# Patient Record
Sex: Male | Born: 1950 | Race: White | Hispanic: No | Smoking: Former smoker
Health system: Southern US, Community
[De-identification: ages and names within clinical notes are randomized; demographics above are authoritative.]

## PROBLEM LIST (undated history)

## (undated) DIAGNOSIS — E669 Obesity, unspecified: Secondary | ICD-10-CM

## (undated) DIAGNOSIS — M5090 Cervical disc disorder, unspecified, unspecified cervical region: Secondary | ICD-10-CM

## (undated) DIAGNOSIS — M1990 Unspecified osteoarthritis, unspecified site: Secondary | ICD-10-CM

## (undated) DIAGNOSIS — I509 Heart failure, unspecified: Secondary | ICD-10-CM

## (undated) DIAGNOSIS — G4733 Obstructive sleep apnea (adult) (pediatric): Secondary | ICD-10-CM

## (undated) DIAGNOSIS — M545 Low back pain: Secondary | ICD-10-CM

## (undated) DIAGNOSIS — Z86010 Personal history of colonic polyps: Secondary | ICD-10-CM

## (undated) DIAGNOSIS — I10 Essential (primary) hypertension: Secondary | ICD-10-CM

## (undated) DIAGNOSIS — M109 Gout, unspecified: Secondary | ICD-10-CM

## (undated) DIAGNOSIS — N189 Chronic kidney disease, unspecified: Secondary | ICD-10-CM

## (undated) DIAGNOSIS — Z87442 Personal history of urinary calculi: Secondary | ICD-10-CM

## (undated) DIAGNOSIS — E7800 Pure hypercholesterolemia, unspecified: Secondary | ICD-10-CM

## (undated) DIAGNOSIS — K219 Gastro-esophageal reflux disease without esophagitis: Secondary | ICD-10-CM

## (undated) DIAGNOSIS — Z972 Presence of dental prosthetic device (complete) (partial): Secondary | ICD-10-CM

## (undated) HISTORY — PX: COLONOSCOPY: SHX174

## (undated) HISTORY — DX: Obesity, unspecified: E66.9

## (undated) HISTORY — DX: Chronic kidney disease, unspecified: N18.9

## (undated) HISTORY — DX: Gout, unspecified: M10.9

## (undated) HISTORY — DX: Cervical disc disorder, unspecified, unspecified cervical region: M50.90

## (undated) HISTORY — DX: Obstructive sleep apnea (adult) (pediatric): G47.33

## (undated) HISTORY — PX: LUMBAR LAMINECTOMY: SHX95

## (undated) HISTORY — PX: CARPAL TUNNEL RELEASE: SHX101

## (undated) HISTORY — DX: Low back pain: M54.5

## (undated) HISTORY — DX: Pure hypercholesterolemia, unspecified: E78.00

## (undated) HISTORY — PX: MULTIPLE TOOTH EXTRACTIONS: SHX2053

## (undated) HISTORY — DX: Essential (primary) hypertension: I10

## (undated) HISTORY — PX: CARDIAC CATHETERIZATION: SHX172

## (undated) HISTORY — DX: Unspecified osteoarthritis, unspecified site: M19.90

## (undated) HISTORY — PX: TOTAL KNEE ARTHROPLASTY: SHX125

## (undated) HISTORY — DX: Personal history of urinary calculi: Z87.442

## (undated) HISTORY — PX: RADIOFREQUENCY ABLATION KIDNEY: SHX2292

## (undated) HISTORY — DX: Personal history of colonic polyps: Z86.010

---

## 2003-07-24 HISTORY — PX: JOINT REPLACEMENT: SHX530

## 2004-04-26 ENCOUNTER — Encounter: Admission: RE | Admit: 2004-04-26 | Discharge: 2004-04-26 | Payer: Self-pay | Admitting: Neurology

## 2004-07-03 ENCOUNTER — Ambulatory Visit: Payer: Self-pay | Admitting: Internal Medicine

## 2004-07-11 ENCOUNTER — Ambulatory Visit: Payer: Self-pay | Admitting: Internal Medicine

## 2004-10-02 ENCOUNTER — Ambulatory Visit: Payer: Self-pay | Admitting: Internal Medicine

## 2005-01-02 ENCOUNTER — Ambulatory Visit: Payer: Self-pay | Admitting: Internal Medicine

## 2005-06-25 ENCOUNTER — Ambulatory Visit: Payer: Self-pay | Admitting: Internal Medicine

## 2005-07-02 ENCOUNTER — Ambulatory Visit: Payer: Self-pay | Admitting: Internal Medicine

## 2005-07-19 ENCOUNTER — Ambulatory Visit: Payer: Self-pay | Admitting: Internal Medicine

## 2005-08-13 ENCOUNTER — Ambulatory Visit: Payer: Self-pay | Admitting: Internal Medicine

## 2005-09-18 ENCOUNTER — Ambulatory Visit: Payer: Self-pay | Admitting: Internal Medicine

## 2005-11-12 ENCOUNTER — Ambulatory Visit: Payer: Self-pay | Admitting: Internal Medicine

## 2006-09-30 ENCOUNTER — Ambulatory Visit: Payer: Self-pay | Admitting: Internal Medicine

## 2006-12-30 ENCOUNTER — Ambulatory Visit: Payer: Self-pay | Admitting: Internal Medicine

## 2006-12-30 ENCOUNTER — Encounter: Payer: Self-pay | Admitting: Internal Medicine

## 2006-12-30 LAB — CONVERTED CEMR LAB
ALT: 26 units/L (ref 0–40)
AST: 20 units/L (ref 0–37)
Albumin: 3.8 g/dL (ref 3.5–5.2)
Alkaline Phosphatase: 89 units/L (ref 39–117)
BUN: 31 mg/dL — ABNORMAL HIGH (ref 6–23)
Basophils Absolute: 0.1 10*3/uL (ref 0.0–0.1)
Basophils Relative: 0.9 % (ref 0.0–1.0)
Bilirubin, Direct: 0.1 mg/dL (ref 0.0–0.3)
CO2: 26 meq/L (ref 19–32)
Calcium: 9.9 mg/dL (ref 8.4–10.5)
Chloride: 113 meq/L — ABNORMAL HIGH (ref 96–112)
Cholesterol: 241 mg/dL (ref 0–200)
Creatinine, Ser: 1.6 mg/dL — ABNORMAL HIGH (ref 0.4–1.5)
Direct LDL: 163.9 mg/dL
Eosinophils Absolute: 0.3 10*3/uL (ref 0.0–0.6)
Eosinophils Relative: 3 % (ref 0.0–5.0)
GFR calc Af Amer: 58 mL/min
GFR calc non Af Amer: 48 mL/min
Glucose, Bld: 99 mg/dL (ref 70–99)
HCT: 46.8 % (ref 39.0–52.0)
HDL: 28.2 mg/dL — ABNORMAL LOW (ref >39.0)
Hemoglobin: 15.7 g/dL (ref 13.0–17.0)
Hgb A1c MFr Bld: 6.1 % — ABNORMAL HIGH (ref 4.6–6.0)
Lymphocytes Relative: 27.9 % (ref 12.0–46.0)
MCHC: 33.6 g/dL (ref 30.0–36.0)
MCV: 84.3 fL (ref 78.0–100.0)
Monocytes Absolute: 0.8 10*3/uL — ABNORMAL HIGH (ref 0.2–0.7)
Monocytes Relative: 7.6 % (ref 3.0–11.0)
Neutro Abs: 6.3 10*3/uL (ref 1.4–7.7)
Neutrophils Relative %: 60.6 % (ref 43.0–77.0)
PSA: 0.45 ng/mL (ref 0.10–4.00)
Platelets: 265 10*3/uL (ref 150–400)
Potassium: 5 meq/L (ref 3.5–5.1)
RBC: 5.56 M/uL (ref 4.22–5.81)
RDW: 14.3 % (ref 11.5–14.6)
Sodium: 144 meq/L (ref 135–145)
Total Bilirubin: 1.1 mg/dL (ref 0.3–1.2)
Total CHOL/HDL Ratio: 8.5
Total Protein: 7.3 g/dL (ref 6.0–8.3)
Triglycerides: 217 mg/dL (ref 0–149)
VLDL: 43 mg/dL — ABNORMAL HIGH (ref 0–40)
WBC: 10.4 10*3/uL (ref 4.5–10.5)

## 2007-05-26 ENCOUNTER — Inpatient Hospital Stay (HOSPITAL_COMMUNITY): Admission: RE | Admit: 2007-05-26 | Discharge: 2007-05-30 | Payer: Self-pay | Admitting: Orthopedic Surgery

## 2007-06-05 ENCOUNTER — Telehealth: Payer: Self-pay | Admitting: Internal Medicine

## 2007-06-27 DIAGNOSIS — M1990 Unspecified osteoarthritis, unspecified site: Secondary | ICD-10-CM

## 2007-06-27 DIAGNOSIS — I10 Essential (primary) hypertension: Secondary | ICD-10-CM

## 2007-06-27 DIAGNOSIS — M545 Low back pain, unspecified: Secondary | ICD-10-CM

## 2007-06-27 DIAGNOSIS — Z860100 Personal history of colon polyps, unspecified: Secondary | ICD-10-CM

## 2007-06-27 DIAGNOSIS — Z87442 Personal history of urinary calculi: Secondary | ICD-10-CM

## 2007-06-27 HISTORY — DX: Personal history of colon polyps, unspecified: Z86.0100

## 2007-06-27 HISTORY — DX: Personal history of colonic polyps: Z86.010

## 2007-06-27 HISTORY — DX: Essential (primary) hypertension: I10

## 2007-06-27 HISTORY — DX: Low back pain, unspecified: M54.50

## 2007-06-27 HISTORY — DX: Unspecified osteoarthritis, unspecified site: M19.90

## 2007-06-27 HISTORY — DX: Personal history of urinary calculi: Z87.442

## 2007-06-30 ENCOUNTER — Ambulatory Visit: Payer: Self-pay | Admitting: Internal Medicine

## 2007-06-30 DIAGNOSIS — E7800 Pure hypercholesterolemia, unspecified: Secondary | ICD-10-CM

## 2007-06-30 HISTORY — DX: Pure hypercholesterolemia, unspecified: E78.00

## 2007-06-30 LAB — CONVERTED CEMR LAB
BUN: 22 mg/dL (ref 6–23)
CO2: 24 meq/L (ref 19–32)
Calcium: 9.8 mg/dL (ref 8.4–10.5)
Chloride: 103 meq/L (ref 96–112)
Cholesterol: 203 mg/dL (ref 0–200)
Creatinine, Ser: 1.5 mg/dL (ref 0.4–1.5)
Direct LDL: 140.8 mg/dL
GFR calc Af Amer: 62 mL/min
GFR calc non Af Amer: 51 mL/min
Glucose, Bld: 103 mg/dL — ABNORMAL HIGH (ref 70–99)
HDL: 24.9 mg/dL — ABNORMAL LOW (ref >39.0)
Potassium: 4.8 meq/L (ref 3.5–5.1)
Sodium: 136 meq/L (ref 135–145)
Total CHOL/HDL Ratio: 8.2
Triglycerides: 175 mg/dL — ABNORMAL HIGH (ref 0–149)
VLDL: 35 mg/dL (ref 0–40)

## 2007-10-28 ENCOUNTER — Telehealth: Payer: Self-pay | Admitting: Internal Medicine

## 2007-12-16 ENCOUNTER — Encounter: Payer: Self-pay | Admitting: Internal Medicine

## 2009-01-17 ENCOUNTER — Telehealth: Payer: Self-pay | Admitting: Internal Medicine

## 2009-01-20 ENCOUNTER — Ambulatory Visit: Payer: Self-pay | Admitting: Internal Medicine

## 2009-01-20 LAB — CONVERTED CEMR LAB
ALT: 30 U/L
AST: 23 U/L
Albumin: 3.7 g/dL
Alkaline Phosphatase: 95 U/L
BUN: 31 mg/dL — ABNORMAL HIGH
Basophils Absolute: 0.1 K/uL
Basophils Relative: 0.8 %
Bilirubin, Direct: 0.2 mg/dL
CO2: 29 meq/L
Calcium: 8.9 mg/dL
Chloride: 109 meq/L
Cholesterol: 207 mg/dL — ABNORMAL HIGH
Creatinine, Ser: 1.6 mg/dL — ABNORMAL HIGH
Direct LDL: 155 mg/dL
Eosinophils Absolute: 0.3 K/uL
Eosinophils Relative: 3.1 %
GFR calc non Af Amer: 47.27 mL/min
Glucose, Bld: 84 mg/dL
HCT: 41.4 %
HDL: 28.4 mg/dL — ABNORMAL LOW
Hemoglobin, Urine: NEGATIVE
Hemoglobin: 14 g/dL
Leukocytes, UA: NEGATIVE
Lymphocytes Relative: 27.9 %
Lymphs Abs: 2.5 K/uL
MCHC: 33.7 g/dL
MCV: 82.9 fL
Monocytes Absolute: 0.8 K/uL
Monocytes Relative: 8.4 %
Neutro Abs: 5.3 K/uL
Neutrophils Relative %: 59.8 %
Nitrite: NEGATIVE
PSA: 0.47 ng/mL
Platelets: 234 K/uL
Potassium: 5.2 meq/L — ABNORMAL HIGH
RBC: 4.99 M/uL
RDW: 15.1 % — ABNORMAL HIGH
Sodium: 143 meq/L
Specific Gravity, Urine: >=1.03
TSH: 1.79 u[IU]/mL
Total Bilirubin: 1.2 mg/dL
Total CHOL/HDL Ratio: 7
Total Protein, Urine: 100 mg/dL
Total Protein: 7.1 g/dL
Triglycerides: 176 mg/dL — ABNORMAL HIGH
Urine Glucose: NEGATIVE mg/dL
Urobilinogen, UA: 0.2
VLDL: 35.2 mg/dL
WBC: 9 10*3/microliter
pH: 5

## 2009-01-27 ENCOUNTER — Ambulatory Visit: Payer: Self-pay | Admitting: Internal Medicine

## 2009-01-27 DIAGNOSIS — G4733 Obstructive sleep apnea (adult) (pediatric): Secondary | ICD-10-CM

## 2009-01-27 HISTORY — DX: Obstructive sleep apnea (adult) (pediatric): G47.33

## 2009-03-10 ENCOUNTER — Encounter: Payer: Self-pay | Admitting: Internal Medicine

## 2010-01-25 ENCOUNTER — Ambulatory Visit: Payer: Self-pay | Admitting: Internal Medicine

## 2010-01-26 ENCOUNTER — Encounter (INDEPENDENT_AMBULATORY_CARE_PROVIDER_SITE_OTHER): Payer: Self-pay | Admitting: Internal Medicine

## 2010-01-26 ENCOUNTER — Ambulatory Visit: Payer: Self-pay | Admitting: Cardiology

## 2010-01-27 ENCOUNTER — Encounter: Payer: Self-pay | Admitting: Internal Medicine

## 2010-01-27 ENCOUNTER — Ambulatory Visit: Payer: Self-pay | Admitting: Surgery

## 2010-01-28 ENCOUNTER — Encounter: Payer: Self-pay | Admitting: Internal Medicine

## 2010-01-30 ENCOUNTER — Ambulatory Visit: Payer: Self-pay | Admitting: Internal Medicine

## 2010-01-30 DIAGNOSIS — M109 Gout, unspecified: Secondary | ICD-10-CM

## 2010-01-30 DIAGNOSIS — E669 Obesity, unspecified: Secondary | ICD-10-CM

## 2010-01-30 HISTORY — DX: Gout, unspecified: M10.9

## 2010-01-30 HISTORY — DX: Obesity, unspecified: E66.9

## 2010-02-28 ENCOUNTER — Telehealth: Payer: Self-pay | Admitting: Internal Medicine

## 2010-06-05 ENCOUNTER — Ambulatory Visit: Payer: Self-pay | Admitting: Internal Medicine

## 2010-06-05 LAB — CONVERTED CEMR LAB
BUN: 24 mg/dL — ABNORMAL HIGH (ref 6–23)
CO2: 25 meq/L (ref 19–32)
Calcium: 9 mg/dL (ref 8.4–10.5)
Chloride: 107 meq/L (ref 96–112)
Creatinine, Ser: 1.7 mg/dL — ABNORMAL HIGH (ref 0.4–1.5)
GFR calc non Af Amer: 45.09 mL/min (ref >60)
Glucose, Bld: 95 mg/dL (ref 70–99)
PSA: 0.6 ng/mL (ref 0.10–4.00)
Potassium: 4.9 meq/L (ref 3.5–5.1)
Sodium: 139 meq/L (ref 135–145)

## 2010-06-29 ENCOUNTER — Inpatient Hospital Stay (HOSPITAL_COMMUNITY): Admission: EM | Admit: 2010-06-29 | Discharge: 2010-01-28 | Payer: Self-pay | Admitting: Emergency Medicine

## 2010-08-12 ENCOUNTER — Encounter: Payer: Self-pay | Admitting: Neurology

## 2010-08-24 NOTE — Progress Notes (Signed)
Summary: Pt meds list not up to date for refill  Phone Note Call from Patient Call back at Work Phone [phone]   Caller: Patient Summary of Call: Lft msg for pt to call back about his crestor that was being requested a refill. [facility], CMA    Pt called back and said that he is returning a call. Pls call back asap. Pt was in for an ov on 01/30/10.      Initial call taken by: Braulio Bosch,  February 28, 2010 11:34 AM  Follow-up for Phone Call        Spoke with pt he said Dr. Raliegh Ip was aware of med change from hospital when he was here for last office vist. Michela Pitcher that he has another rx to be filled. Our med list is not up to date from last hosptial visit Follow-up by: Clearnce Sorrel CMA,  February 28, 2010 12:06 PM    New/Updated Medications: CRESTOR 5 MG TABS (ROSUVASTATIN CALCIUM) Take 1 tablet every day Prescriptions: CRESTOR 5 MG TABS (ROSUVASTATIN CALCIUM) Take 1 tablet every day  #30 x 0   Entered by:   Clearnce Sorrel CMA   Authorized by:   Marletta Lor  MD   Signed by:   Clearnce Sorrel CMA on 02/28/2010   Method used:   Electronically to        [facility]  [phone]* (retail)       [place], [place]  [address]       Ph: [phone] or [phone]       Fax: [phone]   RxIDAL:[id number]

## 2010-08-24 NOTE — Assessment & Plan Note (Signed)
Summary: fup//ccm   Vital Signs:  Patient profile:   60 year old male Weight:      337 pounds Temp:     97.9 degrees F oral BP sitting:   118 / 80  (right arm) Cuff size:   large  Vitals Entered By: Cay Schillings LPN (July 11, 624THL 624THL AM) CC: f/u - post hospital , c/o bilat. great toe pain ever since home from hospital (had gout episode) Is Patient Diabetic? No   CC:  f/u - post hospital  and c/o bilat. great toe pain ever since home from hospital (had gout episode).  History of Present Illness:  60 year old patient who is seen today for follow-up.  he was recently admitted to the hospital for evaluation of chest pain.  He was discharged two days ago and is completing a prednisone taper for gout.  Hospital  records were reviewed.  evaluation included a heart catheterization that revealed minimal nonobstructive disease.  Chest CTA was negative for acute pulmonary embolism.  Mild renal insufficiency was noted and hydrochlorothiazide, and anti-inflammatory medications, discontinued.  He had been taking anti-inflammatory medications rarely  for acute gout.  He has a history of exogenous obesity, and mild dyslipidemia.  Allergies: 1)  ! Codeine Phosphate (Codeine Phosphate)  Past History:  Past Medical History: Colonic polyps, hx of Hypertension Low back pain Osteoarthritis Cervical disc disease Nephrolithiasis, hx of Obesity Gout history of mild renal insufficiency  Past Surgical History: Lumbar laminectomy post left total knee replacement surgery, November 2008 colonoscopy in 1997 2006 heart catheterization in July 2011  Family History: Reviewed history from 01/27/2009 and no changes required. father died age 53 mother died age 65, MI and also had colon cancer two brothers, one MI, age 57; one died MI at an elderly age 75 sisters-history of  diabetes  Social History: Reviewed history from 01/27/2009 and no changes required. Married Never Smoked  Review of  Systems       The patient complains of weight gain, chest pain, and difficulty walking.  The patient denies anorexia, fever, weight loss, vision loss, decreased hearing, hoarseness, syncope, dyspnea on exertion, peripheral edema, prolonged cough, headaches, hemoptysis, abdominal pain, melena, hematochezia, severe indigestion/heartburn, hematuria, incontinence, genital sores, muscle weakness, suspicious skin lesions, transient blindness, depression, unusual weight change, abnormal bleeding, enlarged lymph nodes, angioedema, breast masses, and testicular masses.    Physical Exam  General:  overweight-appearing.  118/80overweight-appearing.   Head:  Normocephalic and atraumatic without obvious abnormalities. No apparent alopecia or balding. Eyes:  No corneal or conjunctival inflammation noted. EOMI. Perrla. Funduscopic exam benign, without hemorrhages, exudates or papilledema. Vision grossly normal. Mouth:  Oral mucosa and oropharynx without lesions or exudates.  Teeth in good repair. Neck:  No deformities, masses, or tenderness noted. Lungs:  Normal respiratory effort, chest expands symmetrically. Lungs are clear to auscultation, no crackles or wheezes. Heart:  Normal rate and regular rhythm. S1 and S2 normal without gallop, murmur, click, rub or other extra sounds. Abdomen:  obese soft and nontender.  No mass or guarding.  No organomegaly Msk:  No deformity or scoliosis noted of thoracic or lumbar spine.   mild inflammatory changes involving bilateral first MTP joints Pulses:  R and L carotid,radial,femoral,dorsalis pedis and posterior tibial pulses are full and equal bilaterally Extremities:  No clubbing, cyanosis, edema, or deformity noted with normal full range of motion of all joints.     Impression & Recommendations:  Problem # 1:  GOUT (ICD-274.9)  Problem # 2:  OSTEOARTHRITIS (  ICD-715.90)  The following medications were removed from the medication list:    Vicodin 5-500 Mg Tabs  (Hydrocodone-acetaminophen) .Marland Kitchen... 1 q6h as needed    Diclofenac Sodium 75 Mg Tbec (Diclofenac sodium) .Marland Kitchen... Take 1 tablet by mouth twice a day His updated medication list for this problem includes:    Aspir-low 81 Mg Tbec (Aspirin) .Marland Kitchen... 1 once daily  The following medications were removed from the medication list:    Vicodin 5-500 Mg Tabs (Hydrocodone-acetaminophen) .Marland Kitchen... 1 q6h as needed    Diclofenac Sodium 75 Mg Tbec (Diclofenac sodium) .Marland Kitchen... Take 1 tablet by mouth twice a day His updated medication list for this problem includes:    Aspir-low 81 Mg Tbec (Aspirin) .Marland Kitchen... 1 once daily  Problem # 3:  HYPERTENSION (ICD-401.9)  The following medications were removed from the medication list:    Hydrochlorothiazide 25 Mg Tabs (Hydrochlorothiazide) ..... One daily His updated medication list for this problem includes:    Amlodipine Besylate 5 Mg Tabs (Amlodipine besylate) .Marland Kitchen... 1 once daily    Benazepril Hcl 40 Mg Tabs (Benazepril hcl) ..... One daily  The following medications were removed from the medication list:    Hydrochlorothiazide 25 Mg Tabs (Hydrochlorothiazide) ..... One daily His updated medication list for this problem includes:    Amlodipine Besylate 5 Mg Tabs (Amlodipine besylate) .Marland Kitchen... 1 once daily    Benazepril Hcl 40 Mg Tabs (Benazepril hcl) ..... One daily  Problem # 4:  EXOGENOUS OBESITY (ICD-278.00)  Complete Medication List: 1)  Amlodipine Besylate 5 Mg Tabs (Amlodipine besylate) .Marland Kitchen.. 1 once daily 2)  Benazepril Hcl 40 Mg Tabs (Benazepril hcl) .... One daily 3)  Cialis 20 Mg Tabs (Tadalafil) 4)  Aspir-low 81 Mg Tbec (Aspirin) .Marland Kitchen.. 1 once daily 5)  Protonix 40 Mg Tbec (Pantoprazole sodium) .... Qd 6)  Prednisone - Tapering To Complete 7//13/11   Patient Instructions: 1)  Please schedule a follow-up appointment in 4 months. 2)  Limit your Sodium (Salt). 3)  It is important that you exercise regularly at least 20 minutes 5 times a week. If you develop chest pain,  have severe difficulty breathing, or feel very tired , stop exercising immediately and seek medical attention. 4)  You need to lose weight. Consider a lower calorie diet and regular exercise.  5)  Check your Blood Pressure regularly. If it is above: 150/90  you should make an appointment. Prescriptions: PROTONIX 40 MG TBEC (PANTOPRAZOLE SODIUM) qd  #90 x 6   Entered and Authorized by:   Marletta Lor  MD   Signed by:   Marletta Lor  MD on 01/30/2010   Method used:   Print then Give to Patient   RxID:   [id number] CIALIS 20 MG  TABS (TADALAFIL)   #12 x 6   Entered and Authorized by:   Marletta Lor  MD   Signed by:   Marletta Lor  MD on 01/30/2010   Method used:   Print then Give to Patient   RxID:   [id number] BENAZEPRIL HCL 40 MG  TABS (BENAZEPRIL HCL) one daily  #90 x 6   Entered and Authorized by:   Marletta Lor  MD   Signed by:   Marletta Lor  MD on 01/30/2010   Method used:   Print then Give to Patient   RxID:   [id number] AMLODIPINE BESYLATE 5 MG  TABS (AMLODIPINE BESYLATE) 1 once daily  #90 x 4   Entered and Authorized by:  Marletta Lor  MD   Signed by:   Marletta Lor  MD on 01/30/2010   Method used:   Print then Give to Patient   RxID:   [id number]

## 2010-08-24 NOTE — Assessment & Plan Note (Signed)
Summary: 4 month rov/nrj   Vital Signs:  Patient profile:   60 year old male Weight:      339 pounds Temp:     98.2 degrees F oral BP sitting:   130 / 80  (right arm) Cuff size:   large  Vitals Entered By: Cay Schillings LPN (November 14, 624THL 7:58 AM) CC: 4 mos rov - doing well Is Patient Diabetic? No Flu Vaccine Consent Questions     Do you have a history of severe allergic reactions to this vaccine? no    Any prior history of allergic reactions to egg and/or gelatin? no    Do you have a sensitivity to the preservative Thimersol? no    Do you have a past history of Guillan-Barre Syndrome? no    Do you currently have an acute febrile illness? no    Have you ever had a severe reaction to latex? no    Vaccine information given and explained to patient? yes    Are you currently pregnant? no    Lot Number:[id number]   Exp Date:01/20/2011   Site Given  Left Deltoid IM   CC:  4 mos rov - doing well.  History of Present Illness: 60 year old patient who is seen today for follow up.  He has a history of hypertension and dyslipidemia.  He has osteoarthritis and is status post total left knee replacement.  Surgery.  He has a history gout exogenous obesity and obstructive sleep apnea.  He is due for follow-up colonoscopy and does have a history of colonic polyps  Preventive Screening-Counseling & Management  Alcohol-Tobacco     Smoking Status: quit  Allergies: 1)  ! Codeine Phosphate (Codeine Phosphate)  Past History:  Past Medical History: Reviewed history from 01/30/2010 and no changes required. Colonic polyps, hx of Hypertension Low back pain Osteoarthritis Cervical disc disease Nephrolithiasis, hx of Obesity Gout history of mild renal insufficiency  Social History: Smoking Status:  quit  Review of Systems  The patient denies anorexia, fever, weight loss, weight gain, vision loss, decreased hearing, hoarseness, chest pain, syncope, dyspnea on exertion,  peripheral edema, prolonged cough, headaches, hemoptysis, abdominal pain, melena, hematochezia, severe indigestion/heartburn, hematuria, incontinence, genital sores, muscle weakness, suspicious skin lesions, transient blindness, difficulty walking, depression, unusual weight change, abnormal bleeding, enlarged lymph nodes, angioedema, breast masses, and testicular masses.    Physical Exam  General:  overweight-appearing.  overweight-appearing.   Head:  Normocephalic and atraumatic without obvious abnormalities. No apparent alopecia or balding. Eyes:  No corneal or conjunctival inflammation noted. EOMI. Perrla. Funduscopic exam benign, without hemorrhages, exudates or papilledema. Vision grossly normal. Mouth:  Oral mucosa and oropharynx without lesions or exudates.  Teeth in good repair. Neck:  No deformities, masses, or tenderness noted. Lungs:  Normal respiratory effort, chest expands symmetrically. Lungs are clear to auscultation, no crackles or wheezes. Heart:  Normal rate and regular rhythm. S1 and S2 normal without gallop, murmur, click, rub or other extra sounds. Abdomen:  Bowel sounds positive,abdomen soft and non-tender without masses, organomegaly or hernias noted. Msk:  No deformity or scoliosis noted of thoracic or lumbar spine.   Pulses:  R and L carotid,radial,femoral,dorsalis pedis and posterior tibial pulses are full and equal bilaterally Extremities:  No clubbing, cyanosis, edema, or deformity noted with normal full range of motion of all joints.     Impression & Recommendations:  Problem # 1:  EXOGENOUS OBESITY (ICD-278.00)  Problem # 2:  HYPERCHOLESTEROLEMIA (ICD-272.0)  His updated medication list for this problem  includes:    Crestor 5 Mg Tabs (Rosuvastatin calcium) .Marland Kitchen... Take 1 tablet every day  His updated medication list for this problem includes:    Crestor 5 Mg Tabs (Rosuvastatin calcium) .Marland Kitchen... Take 1 tablet every day  Problem # 3:  OSTEOARTHRITIS  (ICD-715.90)  His updated medication list for this problem includes:    Aspir-low 81 Mg Tbec (Aspirin) .Marland Kitchen... 1 once daily    Diclofenac Potassium 50 Mg Tabs (Diclofenac potassium) ..... One twice daily as needed  His updated medication list for this problem includes:    Aspir-low 81 Mg Tbec (Aspirin) .Marland Kitchen... 1 once daily    Diclofenac Potassium 50 Mg Tabs (Diclofenac potassium) ..... One twice daily as needed  Complete Medication List: 1)  Amlodipine Besylate 5 Mg Tabs (Amlodipine besylate) .Marland Kitchen.. 1 once daily 2)  Benazepril Hcl 40 Mg Tabs (Benazepril hcl) .... One daily 3)  Cialis 20 Mg Tabs (Tadalafil) 4)  Aspir-low 81 Mg Tbec (Aspirin) .Marland Kitchen.. 1 once daily 5)  Protonix 40 Mg Tbec (Pantoprazole sodium) .... Qd 6)  Prednisone - Tapering To Complete 7//13/11  7)  Crestor 5 Mg Tabs (Rosuvastatin calcium) .... Take 1 tablet every day 8)  Diclofenac Potassium 50 Mg Tabs (Diclofenac potassium) .... One twice daily as needed  Other Orders: Admin 1st Vaccine [id number]) Flu Vaccine 8yrs + [id number]) Venipuncture [id number]) TLB-BMP (Basic Metabolic Panel-BMET) (99991111) TLB-PSA (Prostate Specific Antigen) (84153-PSA) Specimen Handling (99000)  Patient Instructions: 1)  Please schedule a follow-up appointment in 6 months. 2)  Limit your Sodium (Salt) to less than 2 grams a day(slightly less than 1/2 a teaspoon) to prevent fluid retention, swelling, or worsening of symptoms. 3)  It is important that you exercise regularly at least 20 minutes 5 times a week. If you develop chest pain, have severe difficulty breathing, or feel very tired , stop exercising immediately and seek medical attention. 4)  You need to lose weight. Consider a lower calorie diet and regular exercise.  5)  Schedule a colonoscopy/sigmoidoscopy to help detect colon cancer. Prescriptions: DICLOFENAC POTASSIUM 50 MG TABS (DICLOFENAC POTASSIUM) one twice daily as needed  #180 x 4   Entered and Authorized by:   Marletta Lor   MD   Signed by:   Marletta Lor  MD on 06/05/2010   Method used:   Print then Give to Patient   RxID:   [id number] CRESTOR 5 MG TABS (ROSUVASTATIN CALCIUM) Take 1 tablet every day  #90 x 2   Entered and Authorized by:   Marletta Lor  MD   Signed by:   Marletta Lor  MD on 06/05/2010   Method used:   Print then Give to Patient   RxID:   [id number] PROTONIX 40 MG TBEC (PANTOPRAZOLE SODIUM) qd  #90 x 6   Entered and Authorized by:   Marletta Lor  MD   Signed by:   Marletta Lor  MD on 06/05/2010   Method used:   Print then Give to Patient   RxID:   [id number] CIALIS 20 MG  TABS (TADALAFIL)   #12 x 6   Entered and Authorized by:   Marletta Lor  MD   Signed by:   Marletta Lor  MD on 06/05/2010   Method used:   Print then Give to Patient   RxID:   [id number] BENAZEPRIL HCL 40 MG  TABS (BENAZEPRIL HCL) one daily  #90 Tablet x 2   Entered and Authorized by:   Doretha Sou  Burnice Logan  MD   Signed by:   Marletta Lor  MD on 06/05/2010   Method used:   Print then Give to Patient   RxID:   [id number] AMLODIPINE BESYLATE 5 MG  TABS (AMLODIPINE BESYLATE) 1 once daily  #90 x 4   Entered and Authorized by:   Marletta Lor  MD   Signed by:   Marletta Lor  MD on 06/05/2010   Method used:   Print then Give to Patient   RxID:   [id number]    Orders Added: 1)  Admin 1st Vaccine [id number] 2)  Flu Vaccine 33yrs + [id number] 3)  Est. Patient Level III [id number] 4)  Venipuncture [id number] 5)  TLB-BMP (Basic Metabolic Panel-BMET) [id number] 6)  TLB-PSA (Prostate Specific Antigen) [id number] 7)  Specimen Handling [id number]

## 2010-10-08 LAB — CBC
HCT: 36.1 % — ABNORMAL LOW (ref 39.0–52.0)
HCT: 38.4 % — ABNORMAL LOW (ref 39.0–52.0)
Hemoglobin: 12.1 g/dL — ABNORMAL LOW (ref 13.0–17.0)
Hemoglobin: 13.1 g/dL (ref 13.0–17.0)
MCH: 27 pg (ref 26.0–34.0)
MCH: 27 pg (ref 26.0–34.0)
MCH: 27.3 pg (ref 26.0–34.0)
MCHC: 33.5 g/dL (ref 30.0–36.0)
MCHC: 34.1 g/dL (ref 30.0–36.0)
MCV: 79.9 fL (ref 78.0–100.0)
MCV: 80.5 fL (ref 78.0–100.0)
Platelets: 219 10*3/uL (ref 150–400)
Platelets: 222 10*3/uL (ref 150–400)
Platelets: 242 10*3/uL (ref 150–400)
Platelets: 219 10*3/uL (ref 150–400)
RBC: 4.48 MIL/uL (ref 4.22–5.81)
RBC: 4.6 MIL/uL (ref 4.22–5.81)
RBC: 4.81 MIL/uL (ref 4.22–5.81)
RBC: 4.65 MIL/uL (ref 4.22–5.81)
RDW: 16 % — ABNORMAL HIGH (ref 11.5–15.5)
RDW: 16.4 % — ABNORMAL HIGH (ref 11.5–15.5)
WBC: 11.9 10*3/uL — ABNORMAL HIGH (ref 4.0–10.5)
WBC: 9.3 10*3/uL (ref 4.0–10.5)
WBC: 9.9 10*3/uL (ref 4.0–10.5)
WBC: 13.5 10*3/uL — ABNORMAL HIGH (ref 4.0–10.5)

## 2010-10-08 LAB — BASIC METABOLIC PANEL
BUN: 20 mg/dL (ref 6–23)
BUN: 32 mg/dL — ABNORMAL HIGH (ref 6–23)
CO2: 22 mEq/L (ref 19–32)
Calcium: 8.9 mg/dL (ref 8.4–10.5)
Calcium: 9 mg/dL (ref 8.4–10.5)
Calcium: 9.2 mg/dL (ref 8.4–10.5)
Calcium: 9.9 mg/dL (ref 8.4–10.5)
Calcium: 9.1 mg/dL (ref 8.4–10.5)
Chloride: 113 mEq/L — ABNORMAL HIGH (ref 96–112)
Chloride: 110 mEq/L (ref 96–112)
Creatinine, Ser: 1.56 mg/dL — ABNORMAL HIGH (ref 0.4–1.5)
Creatinine, Ser: 1.77 mg/dL — ABNORMAL HIGH (ref 0.4–1.5)
Creatinine, Ser: 1.42 mg/dL (ref 0.4–1.5)
GFR calc Af Amer: 48 mL/min — ABNORMAL LOW (ref >60)
GFR calc Af Amer: 55 mL/min — ABNORMAL LOW (ref >60)
GFR calc Af Amer: 58 mL/min — ABNORMAL LOW (ref >60)
GFR calc Af Amer: >60 mL/min (ref >60)
GFR calc non Af Amer: 40 mL/min — ABNORMAL LOW (ref >60)
GFR calc non Af Amer: 48 mL/min — ABNORMAL LOW (ref >60)
GFR calc non Af Amer: 52 mL/min — ABNORMAL LOW (ref >60)
GFR calc non Af Amer: 51 mL/min — ABNORMAL LOW (ref >60)
Glucose, Bld: 140 mg/dL — ABNORMAL HIGH (ref 70–99)
Glucose, Bld: 99 mg/dL (ref 70–99)
Potassium: 4.7 mEq/L (ref 3.5–5.1)
Potassium: 4.7 mEq/L (ref 3.5–5.1)
Sodium: 142 mEq/L (ref 135–145)
Sodium: 143 mEq/L (ref 135–145)

## 2010-10-08 LAB — COMPREHENSIVE METABOLIC PANEL
ALT: 31 U/L (ref 0–53)
AST: 26 U/L (ref 0–37)
Albumin: 3.3 g/dL — ABNORMAL LOW (ref 3.5–5.2)
Alkaline Phosphatase: 92 U/L (ref 39–117)
BUN: 28 mg/dL — ABNORMAL HIGH (ref 6–23)
CO2: 23 mEq/L (ref 19–32)
Calcium: 9.1 mg/dL (ref 8.4–10.5)
Chloride: 113 mEq/L — ABNORMAL HIGH (ref 96–112)
Creatinine, Ser: 1.71 mg/dL — ABNORMAL HIGH (ref 0.4–1.5)
GFR calc Af Amer: 50 mL/min — ABNORMAL LOW (ref >60)
GFR calc non Af Amer: 41 mL/min — ABNORMAL LOW (ref >60)
Glucose, Bld: 104 mg/dL — ABNORMAL HIGH (ref 70–99)
Potassium: 4.8 mEq/L (ref 3.5–5.1)
Sodium: 142 mEq/L (ref 135–145)
Total Bilirubin: 0.4 mg/dL (ref 0.3–1.2)
Total Protein: 6.4 g/dL (ref 6.0–8.3)

## 2010-10-08 LAB — POCT CARDIAC MARKERS
CKMB, poc: 1.9 ng/mL (ref 1.0–8.0)
Myoglobin, poc: 169 ng/mL (ref 12–200)
Troponin i, poc: <0.05 ng/mL (ref 0.00–0.09)

## 2010-10-08 LAB — LIPID PANEL
HDL: 30 mg/dL — ABNORMAL LOW (ref >39)
Triglycerides: 228 mg/dL — ABNORMAL HIGH (ref <150)
VLDL: 46 mg/dL — ABNORMAL HIGH (ref 0–40)

## 2010-10-08 LAB — HEMOGLOBIN A1C
Hgb A1c MFr Bld: 6.2 % — ABNORMAL HIGH (ref <5.7)
Mean Plasma Glucose: 131 mg/dL — ABNORMAL HIGH (ref <117)

## 2010-10-08 LAB — DIFFERENTIAL
Basophils Absolute: 0 10*3/uL (ref 0.0–0.1)
Basophils Relative: 1 % (ref 0–1)
Eosinophils Absolute: 0.3 10*3/uL (ref 0.0–0.7)
Eosinophils Relative: 3 % (ref 0–5)
Lymphocytes Relative: 32 % (ref 12–46)
Lymphs Abs: 3 10*3/uL (ref 0.7–4.0)
Monocytes Absolute: 0.7 10*3/uL (ref 0.1–1.0)
Monocytes Relative: 8 % (ref 3–12)
Neutro Abs: 5.2 10*3/uL (ref 1.7–7.7)
Neutrophils Relative %: 56 % (ref 43–77)

## 2010-10-08 LAB — TROPONIN I: Troponin I: <0.01 ng/mL (ref 0.00–0.06)

## 2010-10-08 LAB — CARDIAC PANEL(CRET KIN+CKTOT+MB+TROPI)
CK, MB: 2.3 ng/mL (ref 0.3–4.0)
Troponin I: <0.01 ng/mL (ref 0.00–0.06)

## 2010-10-08 LAB — CREATININE, URINE, RANDOM: Creatinine, Urine: 73.1 mg/dL

## 2010-10-08 LAB — SODIUM, URINE, RANDOM: Sodium, Ur: 159 mEq/L

## 2010-10-08 LAB — CK TOTAL AND CKMB (NOT AT ARMC)
CK, MB: 2.5 ng/mL (ref 0.3–4.0)
Relative Index: INVALID (ref 0.0–2.5)
Total CK: 67 U/L (ref 7–232)

## 2010-10-08 LAB — HEPARIN LEVEL (UNFRACTIONATED)
Heparin Unfractionated: 0.51 IU/mL (ref 0.30–0.70)
Heparin Unfractionated: 0.52 IU/mL (ref 0.30–0.70)

## 2010-10-08 LAB — D-DIMER, QUANTITATIVE: D-Dimer, Quant: 3 ug/mL-FEU — ABNORMAL HIGH (ref 0.00–0.48)

## 2010-11-28 ENCOUNTER — Encounter: Payer: Self-pay | Admitting: Internal Medicine

## 2010-12-04 ENCOUNTER — Ambulatory Visit: Payer: Self-pay | Admitting: Internal Medicine

## 2010-12-05 NOTE — H&P (Signed)
NAMEGREGOREY, Alex Williams                 ACCOUNT NO.:  [id number]   MEDICAL RECORD NO.:  [id number]          PATIENT TYPE:  INP   LOCATION:  NA                           FACILITY:  [facility]   PHYSICIAN:  Tarri Glenn, M.D.  DATE OF BIRTH:  15-Nov-1950   DATE OF ADMISSION:  05/20/2007  DATE OF DISCHARGE:                              HISTORY & PHYSICAL   CHIEF COMPLAINT:  Pain in my left knee.   HISTORY OF PRESENT ILLNESS:  This 60 year old white male has been  followed by [place] over the years for his left knee.  He had an injury  secondary to football some years ago and in 1974 underwent an arthrotomy  with medial meniscectomy to his left knee.  He has done fairly well over  the years but now is having increasing pain into his left knee,  difficulty getting about, bending, squatting, climbing.  For this  reason, he is highly desirous to have more definitive treatment done to  his left knee.   Examination reveals crepitus on range of motion and a slight valgus  tilt. X-rays have shown an osteoarthritis of the left knee particularly  in the medial compartment.  After much discussion including questions  and answers session for the risks and benefits of surgery, we decided to  go ahead with total knee replacement arthroplasty of the left knee.  The  patient will have his family members help him after the surgical  procedure and this of course would include his wife.   PAST MEDICAL HISTORY:  The patient's family physician is Dr. Bluford Kaufmann. He is treating him for hypertension and occasional  migraines.   CURRENT MEDICATIONS:  1. Benazepril hydrochloride 40 mg daily.  2. Hydrochlorothiazide 25 mg daily.  3. Amlodipine besylate 5 mg daily.  4. Ketorolac 10 mg 1 tablet every 6 hours (will stop this prior to      surgery).  5. Hydrocodone for pain.   ALLERGIES:  He is allergic to CODEINE.  He also has some intolerance to  Vicodin if he takes it over several days. This is primarily just  stomach  irritation.   Other surgeries include diskectomy by Dr. Joya Salm, L5-S1 for herniated  nucleus pulposus in 1991.  He had distal clavicle resection, subacromial  decompression of his left shoulder in 2003 and a carpal tunnel release  in his left hand in 2003.   FAMILY HISTORY:  Positive for heart disease in the father, colon cancer  in the mother.   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   REVIEW OF SYSTEMS:  CNS:  No seizure disorder, paralysis, numbness,  double vision.  RESPIRATORY:  No productive cough, no hemoptysis or  shortness of breath.  CARDIOVASCULAR:  No chest pain, no angina, no  orthopnea.  GASTROINTESTINAL:  No nausea, vomiting, melena or bloody  stool.  GENITOURINARY:  No discharge, dysuria or hematuria.  MUSCULOSKELETAL:  Primarily in present illness of the left knee.   PHYSICAL EXAMINATION:  Alert, cooperative, friendly, rather large, 74-  year-old white male.  VITAL SIGNS:  Blood pressure 152/98 left arm seated, pulse 82 regular,  respirations 12 unlabored.  HEENT:  Normocephalic, PERRLA, EOM intact.  Oropharynx is clear.  CHEST:  Clear to auscultation.  No rhonchi, no rales, no wheezes.  HEART:  Regular rate and rhythm.  No murmurs are heard.  ABDOMEN:  Obese, soft, nontender.  Liver spleen not felt.  GENITALIA/RECTAL:  Not done, not pertinent to present illness.  His left knee has a large old surgical scar along the medial knee and as  above.   ADMISSION DIAGNOSES:  1. Osteoarthritis of the left knee.  2. Hypertension.  3. History of migraines.   PLAN:  The patient will undergo total knee replacement arthroplasty of  the left knee. The risks and benefits of surgery have been described to  the patient.  Should we have any medical problems,  we will certainly  call Dr. Bluford Kaufmann to help [place] during this hospitalization.      [name].    ______________________________  Tarri Glenn, M.D.    DLU/MEDQ  D:  05/20/2007  T:  05/21/2007  Job:  [id number]   cc:   Marletta Lor, MD  [place]  [place] [address]

## 2010-12-05 NOTE — Discharge Summary (Signed)
Alex Williams, Alex Williams                 ACCOUNT NO.:  [id number]   MEDICAL RECORD NO.:  [id number]          PATIENT TYPE:  INP   LOCATION:  [id number]                         [facility]   PHYSICIAN:  Tarri Glenn, M.D.  DATE OF BIRTH:  [birth year]   DATE OF ADMISSION:  05/26/2007  DATE OF DISCHARGE:  05/30/2007                               DISCHARGE SUMMARY   ADMITTING DIAGNOSES:  1. Osteoarthritis, left knee.  2. Hypertension.  3. History of migraines.   DISCHARGE DIAGNOSES:  1. Osteoarthritis, left knee.  2. Hypertension.  3. History of migraines.  4. Mild postoperative anemia.   OPERATION:  On May 26, 2007 the patient underwent [employer] total  knee replacement arthroplasty of the left knee.  Alex Chol, PA-  C, assisted.   BRIEF HISTORY:  A 60 year old male been seeing by [place] for continuing  problems concerning his left knee.  He had an injury secondary to  football many years ago.  In 1974, had an arthrotomy to the knee with a  medial meniscectomy.  Since that time, he has had increasing pain over  the last few years with difficulty getting about.  He is very active  gentleman and is found that his overall day-to-day activities have  markedly decreased secondary to his left knee pain.  After much  discussion, including the risks and benefits of surgery, decided he  would do well with the above procedure and was scheduled the same.   COURSE IN THE HOSPITAL:  The patient tolerated the surgical procedure  quite well.  The patient had considerable amount of bleeding immediately  postop from his knee wound.  However, this eventually stopped.  His  hematocrit and hemoglobin stayed within acceptable limits  postoperatively.  We held the CPM for a couple of days then put him on  the CPM and he tolerated it quite well.  He worked diligently with  Physical Therapy and eventually was ready for discharge.  When seen by  Dr. Shellia Williams, the __________ left lower extremity was  intact.  The  wound was clean and dry.  The patient was stable.   The Hemovac was pulled the second postop day, dressing changed the third  postop day.  The patient was placed on Lovenox and then Coumadin  postoperatively for the prevention of DVT.  Laboratory values in the  hospital hematologically showed a continuing CBC within normal limits.  Preoperatively it was 14.4 and 42 hemoglobin and hematocrit  respectively.  Final hemoglobin was 9.9, hematocrit was 28.7.  He was  not symptomatic.  Blood chemistries were normal.  BUN was slightly high  at 28.  EKG was normal sinus rhythm.  A chest x-ray showed no active  cardiopulmonary pulmonary disease.   CONDITION ON DISCHARGE:  Improved, stable.   PLAN:  The patient is discharged to his home.  Continue with diet that  he enjoyed preoperatively.  He may weight bear as tolerated, shower in a  few days as long as there is no drainage of the wound and keep a dry  dressing on the  wound.  He is given Coumadin by pharmacy for Coumadin protocol, Robaxin  500 mg for muscle spasm, and Tylox for pain.  Should he have any  problems or questions, he should contact us.  Otherwise, he is to follow  with Dr. Burnice Williams concerning any medical problems.  He is to return  to see [place] in about 2 weeks after surgery.      Alex Williams.    ______________________________  Tarri Glenn, M.D.    DLU/MEDQ  D:  06/25/2007  T:  06/25/2007  Job:  [id number]   cc:   Marletta Lor, MD  [place]  [place] [address]

## 2010-12-05 NOTE — Op Note (Signed)
NAMEHEAROLD, Alex Williams                 ACCOUNT NO.:  [id number]   MEDICAL RECORD NO.:  [id number]          PATIENT TYPE:  INP   LOCATION:  0005                         FACILITY:  [facility]   PHYSICIAN:  Tarri Glenn, M.D.  DATE OF BIRTH:  02-24-1951   DATE OF PROCEDURE:  05/26/2007  DATE OF DISCHARGE:                               OPERATIVE REPORT   PREOPERATIVE DIAGNOSIS:  Osteo and traumatic arthritis left knee.   POSTOPERATIVE DIAGNOSIS:  Osteo and traumatic arthritis left knee.   OPERATION:  Osteonics total knee replacement left.   SURGEON:  Tarri Glenn, M.D.   ASSISTANT:  Mr. Karilyn Cota.   ANESTHESIA:  General.   PATHOLOGY AND JUSTIFICATION FOR PROCEDURE:  He has had injury and number  of surgeries to his left knee with progressive pain, has bone-on-bone  abutment medially with medial shift of femur on the tibia.  It was felt  because his size, his age and the deformity that extension of the tibial  component would be helpful.   PROCEDURE:  Prophylactic antibiotics, satisfactory general anesthesia,  pneumatic tourniquet, lateral hip stabilizer Sure Foot.  Left leg was  prepped with DuraPrep and draped in sterile field, Ioban employed.  Time-  out performed, a vertical midline incision down to patellar mechanism  with median parapatellar incision opened the joint.  The tibial  collateral ligament, pes anserinus were dissected off the proximal  tibia.  He had good bit of medial scar from previous surgery.  Knee was  very tight.  I removed large osteophytes from around the patella which I  sized as size 30 and around the femur.  I made a 5/16 inch drill hole in  the distal femur followed by canal finder and the axis aligner set for a  5 degrees valgus cut.  I took 12 mm of distal femur off to correct for  the flexion contracture.  In order to continue with the femoral cuts I  had to now make a initial cut on the tibia which gave [place] more working  room so I could place the  cradle apparatus for sizing the distal femur  as a #11.  Scribe lines were placed on the distal femur and then I used  the jig for cutting anterior and posterior cuts and posterior and  anterior chamferings.  After completing these cuts, I then went to the  tibia where I cleaned up remnants of menisci and remnants of the ACL,  PCL complex, I then sized the tibia as a #11 and placed my initial  intramedullary drill hole followed by step-cut drill canal finder and  then the intramedullary rod for making the initial proximal tibial cut.  I made a 4 mm cut off the depressed medial tibial plateau at 90 degrees  after completing this cut and return to the femur where remnants of the  initial cuts were made, I removed bone from the posterior femur.  I felt  femoral condyles and then placed the jig for making both the patellar  groove and the post.  After making the patellar groove,  I then used  a  saw to make preliminary cuts on the bone intercondylar area and then  used the cutting impactor, followed by the impactor to remove bone for  the post.  I then used the laminar spreader to remove remnants of bone  and soft tissue from behind the femoral condyles and we went through a  trial reduction.  I found that a 10 spacer seemed to fit nicely and we  did not need to remove anymore tibial bone.  With a spacer in place, I  used the external rod splitting the bimalleolar distance to make scribe  lines on the tibia for placement of the base plate subsequent.  I then  went ahead and used the patellar guide for making a 10-mm recessed cut  size 30.  The guide for making three fixation drill holes was then  placed and a trial patella placed creating the three holes.  The trial  was then placed and excess bone around the perimeter of the patella was  trimmed up.  I then returned to the tibia where the base plate was fixed  with three pins based on initial markings and we reamed using the tripod  apparatus  guide up to a 11 cemented.  I then removed the base plate and  hand reamed up to an 18 mm for the tibial extension.  We then went  through a trial reduction of the tibial base plate with the 80 mm  extension and it fit nicely.  Then water picked the knee while the  components were assembled and methyl methacrylate mixed and I then  individually glued in the components starting first with the tibia and  when impacting it and removing excess methacrylate and then went to the  femur where the same was done and knee was then held extension with a 10  mm spacer while the patella was glued in and held with a patellar clamp,  excess methyl methacrylate being removed from around the component.  When the methacrylate had hardened, I removed the tibial spacer and we  checked around the components for excess methyl methacrylate.  The wound  was irrigated well and we then placed 10 mm final spacer which based on  the trial which fit nicely.  Spacer was placed, excellent knee motion  and stability, minimal lateral release was performed.  A Hemovac was  placed and the wound closed in layers interrupted #1 Vicryl in two  layers in the quadriceps tendon and in the synovium capsule distally,  subcutaneous tissue 2-0 Vicryl, staples in the skin.  Betadine Adaptic  dry sterile dressing were applied.  Tourniquet was released 2 hours and  1 minute tourniquet time.  Betadine Adaptic dry sterile dressing and  knee immobilizer applied, tolerated the procedure well, at time of this  dictation was on his way to recovery in satisfactory condition with no  known complications and essentially no blood loss.           ______________________________  Tarri Glenn, M.D.     JA/MEDQ  D:  05/26/2007  T:  05/27/2007  Job:  [id number]

## 2010-12-11 ENCOUNTER — Ambulatory Visit (INDEPENDENT_AMBULATORY_CARE_PROVIDER_SITE_OTHER): Payer: BC Managed Care – PPO | Admitting: Internal Medicine

## 2010-12-11 ENCOUNTER — Encounter: Payer: Self-pay | Admitting: Internal Medicine

## 2010-12-11 ENCOUNTER — Other Ambulatory Visit: Payer: Self-pay | Admitting: Internal Medicine

## 2010-12-11 DIAGNOSIS — E785 Hyperlipidemia, unspecified: Secondary | ICD-10-CM

## 2010-12-11 DIAGNOSIS — E669 Obesity, unspecified: Secondary | ICD-10-CM

## 2010-12-11 DIAGNOSIS — E7800 Pure hypercholesterolemia, unspecified: Secondary | ICD-10-CM

## 2010-12-11 DIAGNOSIS — I10 Essential (primary) hypertension: Secondary | ICD-10-CM

## 2010-12-11 DIAGNOSIS — M1990 Unspecified osteoarthritis, unspecified site: Secondary | ICD-10-CM

## 2010-12-11 DIAGNOSIS — N529 Male erectile dysfunction, unspecified: Secondary | ICD-10-CM

## 2010-12-11 LAB — TESTOSTERONE: Testosterone: 178.32 ng/dL — ABNORMAL LOW (ref 350.00–890.00)

## 2010-12-11 MED ORDER — TESTOSTERONE 20.25 MG/ACT (1.62%) TD GEL: 2.0000 | TRANSDERMAL | Status: DC

## 2010-12-11 NOTE — Progress Notes (Signed)
  Subjective:    Patient ID: Alex Williams, male    DOB: 1950-08-02, 60 y.o.   MRN: [id number]  HPI  60 year old patient who is seen today for his six-month followup. He has a history of treated hypertension. This has been stable. He has a history of exogenous obesity and there's been some steady weight gain. His present weight is now in excess of 350 pounds. Weight loss discussed He has a history of dyslipidemia controlled on Crestor 5 mg daily. He has osteoarthritis and a history of gout. He wished to have a testosterone level checked. He does have a history of erectile dysfunction    Review of Systems  Constitutional: Positive for unexpected weight change. Negative for fever, chills, appetite change and fatigue.  HENT: Negative for hearing loss, ear pain, congestion, sore throat, trouble swallowing, neck stiffness, dental problem, voice change and tinnitus.   Eyes: Negative for pain, discharge and visual disturbance.  Respiratory: Negative for cough, chest tightness, wheezing and stridor.   Cardiovascular: Negative for chest pain, palpitations and leg swelling.  Gastrointestinal: Negative for nausea, vomiting, abdominal pain, diarrhea, constipation, blood in stool and abdominal distention.  Genitourinary: Negative for urgency, hematuria, flank pain, discharge, difficulty urinating and genital sores.  Musculoskeletal: Negative for myalgias, back pain, joint swelling, arthralgias and gait problem.  Skin: Negative for rash.  Neurological: Negative for dizziness, syncope, speech difficulty, weakness, numbness and headaches.  Hematological: Negative for adenopathy. Does not bruise/bleed easily.  Psychiatric/Behavioral: Negative for behavioral problems and dysphoric mood. The patient is not nervous/anxious.        Objective:   Physical Exam  Constitutional: He is oriented to person, place, and time. He appears well-developed and well-nourished. No distress.       Weight in excess of 350  pounds Blood pressure 124/80  HENT:  Head: Normocephalic.  Right Ear: External ear normal.  Left Ear: External ear normal.  Eyes: Conjunctivae and EOM are normal.  Neck: Normal range of motion.  Cardiovascular: Normal rate and normal heart sounds.   Pulmonary/Chest: Breath sounds normal.  Abdominal: Bowel sounds are normal.  Musculoskeletal: Normal range of motion. He exhibits no edema and no tenderness.  Neurological: He is alert and oriented to person, place, and time.  Psychiatric: He has a normal mood and affect. His behavior is normal.          Assessment & Plan:  Exogenous obesity Hypertension controlled Osteoarthritis Dyslipidemia. We'll continue Crestor 5 mg daily  Weight loss encouraged. We'll see in 6 months for his annual exam A testosterone level checked

## 2010-12-11 NOTE — Patient Instructions (Signed)
Limit your sodium (Salt) intake  You need to lose weight.  Consider a lower calorie diet and regular exercise.    It is important that you exercise regularly, at least 20 minutes 3 to 4 times per week.  If you develop chest pain or shortness of breath seek  medical attention.  Return in 6 months for follow-up

## 2010-12-11 NOTE — Progress Notes (Signed)
  Subjective:    Patient ID: Alex Williams, male    DOB: 1950/09/19, 60 y.o.   MRN: [id number]  HPI  Wt Readings from Last 3 Encounters:  06/05/10 339 lb (153.769 kg)  01/27/09 342 lb (155.13 kg)  06/30/07 298 lb (135.172 kg)      Review of Systems     Objective:   Physical Exam        Assessment & Plan:

## 2010-12-13 NOTE — Progress Notes (Signed)
Quick Note:  Spoke with pt - informed of rx sent to pharmacy. Dr. Burnice Logan discussed lab with him. KIK ______

## 2011-05-01 LAB — CBC
HCT: 31 — ABNORMAL LOW
Hemoglobin: 10.5 — ABNORMAL LOW
Hemoglobin: 12.4 — ABNORMAL LOW
Hemoglobin: 9.9 — ABNORMAL LOW
MCHC: 33.8
MCHC: 33.9
MCV: 84
Platelets: 181
RBC: 3.46 — ABNORMAL LOW
RBC: 4.36
RDW: 15 — ABNORMAL HIGH
RDW: 15.1 — ABNORMAL HIGH

## 2011-05-01 LAB — PROTIME-INR
INR: 1.1
INR: 1.3
INR: 1.4
INR: 1.5
Prothrombin Time: 14.1
Prothrombin Time: 16.4 — ABNORMAL HIGH
Prothrombin Time: 18.2 — ABNORMAL HIGH

## 2011-05-01 LAB — ABO/RH: ABO/RH(D): O NEG

## 2011-05-01 LAB — TYPE AND SCREEN
ABO/RH(D): O NEG
Antibody Screen: NEGATIVE

## 2011-05-02 LAB — BASIC METABOLIC PANEL
CO2: 26
Calcium: 9.2
Creatinine, Ser: 1.49
GFR calc Af Amer: 59 — ABNORMAL LOW
GFR calc non Af Amer: 49 — ABNORMAL LOW
Glucose, Bld: 88
Sodium: 138

## 2011-05-02 LAB — PROTIME-INR: Prothrombin Time: 13.2

## 2011-05-02 LAB — HEMOGLOBIN AND HEMATOCRIT, BLOOD: HCT: 42

## 2011-06-04 ENCOUNTER — Other Ambulatory Visit (INDEPENDENT_AMBULATORY_CARE_PROVIDER_SITE_OTHER): Payer: BC Managed Care – PPO

## 2011-06-04 DIAGNOSIS — Z0000 Encounter for general adult medical examination without abnormal findings: Secondary | ICD-10-CM

## 2011-06-04 LAB — POCT URINALYSIS DIPSTICK
Blood, UA: NEGATIVE
Ketones, UA: NEGATIVE
Spec Grav, UA: 1.025
Urobilinogen, UA: 0.2
pH, UA: 5

## 2011-06-05 LAB — LIPID PANEL
Cholesterol: 156 mg/dL (ref 0–200)
HDL: 33.9 mg/dL — ABNORMAL LOW (ref >39.00)
LDL Cholesterol: 96 mg/dL (ref 0–99)
VLDL: 26.4 mg/dL (ref 0.0–40.0)

## 2011-06-05 LAB — CBC WITH DIFFERENTIAL/PLATELET
Basophils Absolute: 0 10*3/uL (ref 0.0–0.1)
Eosinophils Absolute: 0.2 10*3/uL (ref 0.0–0.7)
HCT: 39.2 % (ref 39.0–52.0)
Lymphocytes Relative: 29.3 % (ref 12.0–46.0)
Lymphs Abs: 2.7 10*3/uL (ref 0.7–4.0)
MCHC: 32.9 g/dL (ref 30.0–36.0)
Monocytes Relative: 6.4 % (ref 3.0–12.0)
Platelets: 231 10*3/uL (ref 150.0–400.0)
RDW: 15 % — ABNORMAL HIGH (ref 11.5–14.6)

## 2011-06-05 LAB — HEPATIC FUNCTION PANEL
AST: 20 U/L (ref 0–37)
Total Bilirubin: 0.9 mg/dL (ref 0.3–1.2)

## 2011-06-05 LAB — BASIC METABOLIC PANEL
BUN: 36 mg/dL — ABNORMAL HIGH (ref 6–23)
Calcium: 9.1 mg/dL (ref 8.4–10.5)
GFR: 38.22 mL/min — ABNORMAL LOW (ref >60.00)
Glucose, Bld: 84 mg/dL (ref 70–99)

## 2011-06-11 ENCOUNTER — Encounter: Payer: Self-pay | Admitting: Internal Medicine

## 2011-06-11 ENCOUNTER — Ambulatory Visit (INDEPENDENT_AMBULATORY_CARE_PROVIDER_SITE_OTHER): Payer: BC Managed Care – PPO | Admitting: Internal Medicine

## 2011-06-11 DIAGNOSIS — I10 Essential (primary) hypertension: Secondary | ICD-10-CM

## 2011-06-11 DIAGNOSIS — M1990 Unspecified osteoarthritis, unspecified site: Secondary | ICD-10-CM

## 2011-06-11 DIAGNOSIS — E669 Obesity, unspecified: Secondary | ICD-10-CM

## 2011-06-11 DIAGNOSIS — E7800 Pure hypercholesterolemia, unspecified: Secondary | ICD-10-CM

## 2011-06-11 DIAGNOSIS — G4733 Obstructive sleep apnea (adult) (pediatric): Secondary | ICD-10-CM

## 2011-06-11 DIAGNOSIS — Z23 Encounter for immunization: Secondary | ICD-10-CM

## 2011-06-11 DIAGNOSIS — Z0000 Encounter for general adult medical examination without abnormal findings: Secondary | ICD-10-CM

## 2011-06-11 MED ORDER — TRAMADOL HCL 50 MG PO TABS: 50.0000 mg | ORAL_TABLET | Freq: Four times a day (QID) | ORAL | Status: DC | PRN

## 2011-06-11 MED ORDER — PANTOPRAZOLE SODIUM 40 MG PO TBEC: 40.0000 mg | DELAYED_RELEASE_TABLET | Freq: Every day | ORAL | Status: DC

## 2011-06-11 MED ORDER — TESTOSTERONE 20.25 MG/ACT (1.62%) TD GEL: 2.0000 | TRANSDERMAL | Status: DC

## 2011-06-11 MED ORDER — TADALAFIL 20 MG PO TABS: 20.0000 mg | ORAL_TABLET | Freq: Every day | ORAL | Status: DC | PRN

## 2011-06-11 MED ORDER — BENAZEPRIL HCL 40 MG PO TABS: 40.0000 mg | ORAL_TABLET | Freq: Every day | ORAL | Status: DC

## 2011-06-11 MED ORDER — AMLODIPINE BESYLATE 5 MG PO TABS: 5.0000 mg | ORAL_TABLET | Freq: Every day | ORAL | Status: DC

## 2011-06-11 MED ORDER — ROSUVASTATIN CALCIUM 5 MG PO TABS: 5.0000 mg | ORAL_TABLET | Freq: Every day | ORAL | Status: DC

## 2011-06-11 NOTE — Progress Notes (Signed)
Subjective:    Patient ID: Alex Williams, male    DOB: Dec 08, 1950, 60 y.o.   MRN: [id number]  HPI  60 year old patient seen today for an annual physical. He has a history of hypertension exogenous obesity and minimal coronary artery disease. He had minimal nonobstructive CAD when hospitalized in July of 2011 for chest pain. He has had some chronic kidney disease and does use diclofenac and occasional ibuprofen   Summary: cpx/cjr  Vital Signs:  Patient profile: 60 year old male  Height: 74 inches  Weight: 342 pounds  BMI: 44.07  BP sitting: 120 / 90 (left arm)  Cuff size: large  Vitals Entered By: Chipper Oman, RN (January 27, 2009 2:56 PM)  CC: CPX and labs done.Marland Kitchen  History of Present Illness:  60 year old who is seen today for an annual exammedical problems include hypercholesterolemia, hypertension, DJD. He has exogenous obesity. He has suspected obstructive sleep apnea, but not confirmed. He states that his insurance will not pay for a sleep study has history of colonic polyps. His last colonoscopy in 2006  Preventive Screening-Counseling & Management  Alcohol-Tobacco  Smoking Status: never  Problems Prior to Update:  1) Hypercholesterolemia (ICD-272.0)  2) Nephrolithiasis, Hx of (ICD-V13.01)  3) Osteoarthritis (ICD-715.90)  4) Low Back Pain (ICD-724.2)  5) Hypertension (ICD-401.9)  6) Colonic Polyps, Hx of (ICD-V12.72)  Medications Prior to Update:  1) Amlodipine Besylate 5 Mg Tabs (Amlodipine Besylate) .Marland Kitchen.. 1 Once Daily  2) Vicodin 5-500 Mg Tabs (Hydrocodone-Acetaminophen) .Marland Kitchen.. 1 Q6h As Needed  3) Benazepril Hcl 40 Mg Tabs (Benazepril Hcl) .... One Daily  4) Hydrochlorothiazide 25 Mg Tabs (Hydrochlorothiazide) .... One Daily  5) Cialis 20 Mg Tabs (Tadalafil)  6) Diclofenac Sodium 75 Mg Tbec (Diclofenac Sodium) .... Take 1 Tablet By Mouth Twice A Day  Allergies:  1) ! Codeine Phosphate (Codeine Phosphate)  Past History:  Past Medical History:  Reviewed history from  06/27/2007 and no changes required.  Colonic polyps, hx of  Hypertension  Low back pain  Osteoarthritis  Cervical disc disease  Nephrolithiasis, hx of  Obesity  Past Surgical History:  Lumbar laminectomy  post left total knee replacement surgery, November 2008  colonoscopy in 1997 2006  Family History:  father died age 81, and live  mother died age 62, MI and also had colon cancer  two brothers, one MI, age 40; one died MI at an elderly age  105 sisters-phosphor diabetes  Social History:  Married  Never Smoked  Smoking Status: never   Past Medical History  Diagnosis Date  . COLONIC POLYPS, HX OF 06/27/2007  . EXOGENOUS OBESITY 01/30/2010  . GOUT 01/30/2010  . HYPERCHOLESTEROLEMIA 06/30/2007  . HYPERTENSION 06/27/2007  . LOW BACK PAIN 06/27/2007  . NEPHROLITHIASIS, HX OF 06/27/2007  . OSTEOARTHRITIS 06/27/2007  . SLEEP APNEA, OBSTRUCTIVE, MODERATE 01/27/2009  . Cervical disc disease     History   Social History  . Marital Status: Married    Spouse Name: N/A    Number of Children: N/A  . Years of Education: N/A   Occupational History  . Not on file.   Social History Main Topics  . Smoking status: Former Smoker    Quit date: 07/23/1980  . Smokeless tobacco: Never Used  . Alcohol Use: Yes  . Drug Use: No  . Sexually Active: Not on file   Other Topics Concern  . Not on file   Social History Narrative  . No narrative on file    Past Surgical History  Procedure Date  . Lumbar laminectomy   . Total knee arthroplasty     left  . Cardiac catheterization     Family History  Problem Relation Age of Onset  . Diabetes Neg Hx     family sisters x6 hx of    Allergies  Allergen Reactions  . Codeine Phosphate     Current Outpatient Prescriptions on File Prior to Visit  Medication Sig Dispense Refill  . amLODipine (NORVASC) 5 MG tablet Take 5 mg by mouth daily.        Marland Kitchen aspirin 81 MG tablet Take 81 mg by mouth daily.        . benazepril (LOTENSIN) 40 MG tablet  Take 40 mg by mouth daily.        . diclofenac (VOLTAREN) 50 MG EC tablet Take 50 mg by mouth 2 (two) times daily as needed.        . Fish Oil-Cholecalciferol (OMEGA-3 FISH OIL/VITAMIN D3 PO) Take by mouth daily.        . pantoprazole (PROTONIX) 40 MG tablet Take 40 mg by mouth daily.        . rosuvastatin (CRESTOR) 5 MG tablet Take 5 mg by mouth daily.        . tadalafil (CIALIS) 20 MG tablet Take 20 mg by mouth daily as needed.        . Testosterone 20.25 MG/ACT (1.62%) GEL Place 2 Applicatorfuls onto the skin 1 dose over 24 hours.  75 g  4    BP 140/90  Pulse 90  Temp(Src) 98.2 F (36.8 C) (Oral)  Resp 22  Ht 5\' 11"  (1.803 m)  Wt 345 lb (156.491 kg)  BMI 48.12 kg/m2  SpO2 98%    Review of Systems  Constitutional: Negative for fever, chills, activity change, appetite change and fatigue.  HENT: Negative for hearing loss, ear pain, congestion, rhinorrhea, sneezing, mouth sores, trouble swallowing, neck pain, neck stiffness, dental problem, voice change, sinus pressure and tinnitus.   Eyes: Negative for photophobia, pain, redness and visual disturbance.  Respiratory: Negative for apnea, cough, choking, chest tightness, shortness of breath and wheezing.   Cardiovascular: Negative for chest pain, palpitations and leg swelling.  Gastrointestinal: Negative for nausea, vomiting, abdominal pain, diarrhea, constipation, blood in stool, abdominal distention, anal bleeding and rectal pain.  Genitourinary: Negative for dysuria, urgency, frequency, hematuria, flank pain, decreased urine volume, discharge, penile swelling, scrotal swelling, difficulty urinating, genital sores and testicular pain.  Musculoskeletal: Negative for myalgias, back pain, joint swelling, arthralgias and gait problem.  Skin: Negative for color change, rash and wound.  Neurological: Negative for dizziness, tremors, seizures, syncope, facial asymmetry, speech difficulty, weakness, light-headedness, numbness and headaches.    Hematological: Negative for adenopathy. Does not bruise/bleed easily.  Psychiatric/Behavioral: Negative for suicidal ideas, hallucinations, behavioral problems, confusion, sleep disturbance, self-injury, dysphoric mood, decreased concentration and agitation. The patient is not nervous/anxious.        Objective:   Physical Exam  Constitutional: He appears well-developed and well-nourished.  HENT:  Head: Normocephalic and atraumatic.  Right Ear: External ear normal.  Left Ear: External ear normal.  Nose: Nose normal.  Mouth/Throat: Oropharynx is clear and moist.  Eyes: Conjunctivae and EOM are normal. Pupils are equal, round, and reactive to light. No scleral icterus.  Neck: Normal range of motion. Neck supple. No JVD present. No thyromegaly present.  Cardiovascular: Regular rhythm, normal heart sounds and intact distal pulses.  Exam reveals no gallop and no friction rub.   No murmur  heard.      Dorsalis pedis pulses faint. Posterior tibial pulses full  Pulmonary/Chest: Effort normal and breath sounds normal. He exhibits no tenderness.  Abdominal: Soft. Bowel sounds are normal. He exhibits no distension and no mass. There is no tenderness.  Genitourinary: Penis normal.  Musculoskeletal: Normal range of motion. He exhibits edema. He exhibits no tenderness.       +2 lower extremity edema  Lymphadenopathy:    He has no cervical adenopathy.  Neurological: He is alert. He has normal reflexes. No cranial nerve deficit. Coordination normal.  Skin: Skin is warm and dry. No rash noted.  Psychiatric: He has a normal mood and affect. His behavior is normal.          Assessment & Plan:     preventive health examination  Hypertension stable  Dyslipidemia well controlled   Exogenous obesity  OSA  Colonic polyps. Scheduled for followup colonoscopy next week  Chronic kidney disease. All anti-temperature medications will be discontinued  Bmet  will recheck in 6 months

## 2011-06-11 NOTE — Patient Instructions (Signed)
Limit your sodium (Salt) intake    It is important that you exercise regularly, at least 20 minutes 3 to 4 times per week.  If you develop chest pain or shortness of breath seek  medical attention.  You need to lose weight.  Consider a lower calorie diet and regular exercise.  Return in 6 months for follow-up   

## 2011-07-09 ENCOUNTER — Other Ambulatory Visit: Payer: Self-pay | Admitting: Internal Medicine

## 2011-07-19 ENCOUNTER — Other Ambulatory Visit: Payer: Self-pay | Admitting: Internal Medicine

## 2011-09-10 ENCOUNTER — Other Ambulatory Visit: Payer: Self-pay | Admitting: Internal Medicine

## 2011-12-10 ENCOUNTER — Encounter: Payer: Self-pay | Admitting: Internal Medicine

## 2011-12-10 ENCOUNTER — Ambulatory Visit (INDEPENDENT_AMBULATORY_CARE_PROVIDER_SITE_OTHER): Payer: BC Managed Care – PPO | Admitting: Internal Medicine

## 2011-12-10 VITALS — BP 120/82 | Temp 98.3°F | Wt 330.0 lb

## 2011-12-10 DIAGNOSIS — N189 Chronic kidney disease, unspecified: Secondary | ICD-10-CM

## 2011-12-10 LAB — BASIC METABOLIC PANEL
BUN: 35 mg/dL — ABNORMAL HIGH (ref 6–23)
CO2: 24 mEq/L (ref 19–32)
Chloride: 109 mEq/L (ref 96–112)
Creatinine, Ser: 1.9 mg/dL — ABNORMAL HIGH (ref 0.4–1.5)
Glucose, Bld: 97 mg/dL (ref 70–99)
Potassium: 5.7 mEq/L — ABNORMAL HIGH (ref 3.5–5.1)

## 2011-12-10 MED ORDER — TADALAFIL 5 MG PO TABS: 5.0000 mg | ORAL_TABLET | Freq: Every day | ORAL | Status: DC | PRN

## 2011-12-10 MED ORDER — TRAMADOL HCL 50 MG PO TABS: 50.0000 mg | ORAL_TABLET | Freq: Four times a day (QID) | ORAL | Status: DC | PRN

## 2011-12-10 NOTE — Patient Instructions (Signed)
Limit your sodium (Salt) intake    It is important that you exercise regularly, at least 20 minutes 3 to 4 times per week.  If you develop chest pain or shortness of breath seek  medical attention.  Return in 6 months for follow-up  

## 2011-12-10 NOTE — Progress Notes (Signed)
  Subjective:    Patient ID: Alex Williams, male    DOB: [birth year], 61 y.o.   MRN: [id number]  HPI  61 year old patient seen today for followup. Has history of hypertension and dyslipidemia. He also has a history of chronic kidney disease. 6 months ago creatinine 1.9. Doing quite well. Is also followed by cardiology for minimal coronary artery disease. Since his last visit here he has lost 15 pounds in weight.  Wt Readings from Last 3 Encounters:  12/10/11 330 lb (149.687 kg)  06/11/11 345 lb (156.491 kg)  06/05/10 339 lb (153.769 kg)     Review of Systems  Constitutional: Negative for fever, chills, appetite change and fatigue.  HENT: Negative for hearing loss, ear pain, congestion, sore throat, trouble swallowing, neck stiffness, dental problem, voice change and tinnitus.   Eyes: Negative for pain, discharge and visual disturbance.  Respiratory: Negative for cough, chest tightness, wheezing and stridor.   Cardiovascular: Negative for chest pain, palpitations and leg swelling.  Gastrointestinal: Negative for nausea, vomiting, abdominal pain, diarrhea, constipation, blood in stool and abdominal distention.  Genitourinary: Negative for urgency, hematuria, flank pain, discharge, difficulty urinating and genital sores.  Musculoskeletal: Positive for back pain. Negative for myalgias, joint swelling, arthralgias and gait problem.  Skin: Negative for rash.  Neurological: Negative for dizziness, syncope, speech difficulty, weakness, numbness and headaches.  Hematological: Negative for adenopathy. Does not bruise/bleed easily.  Psychiatric/Behavioral: Negative for behavioral problems and dysphoric mood. The patient is not nervous/anxious.        Objective:   Physical Exam  Constitutional: He is oriented to person, place, and time. He appears well-developed.  HENT:  Head: Normocephalic.  Right Ear: External ear normal.  Left Ear: External ear normal.  Eyes: Conjunctivae and EOM are normal.    Neck: Normal range of motion.  Cardiovascular: Normal rate and normal heart sounds.   Pulmonary/Chest: Breath sounds normal.  Abdominal: Bowel sounds are normal.  Musculoskeletal: Normal range of motion. He exhibits no edema and no tenderness.  Neurological: He is alert and oriented to person, place, and time.  Psychiatric: He has a normal mood and affect. His behavior is normal.          Assessment & Plan:   Hypertension well controlled Exogenous obesity. Improved Chronic kidney disease. We'll check a creatinine Osteoarthritis stable. We'll avoid anti-inflammatory medications. We'll refill his tramadol

## 2012-02-06 ENCOUNTER — Telehealth: Payer: Self-pay

## 2012-02-06 NOTE — Telephone Encounter (Signed)
Fax RF request for testosterone Rf - this was removed from med list "he had not used in over 30days" - please advise on RF Last seen 12/20/11 6 mos rov  Last level done 2012

## 2012-02-07 MED ORDER — TESTOSTERONE 40.5 MG/2.5GM (1.62%) TD GEL: 2.0000 | Freq: Every day | TRANSDERMAL | Status: DC

## 2012-02-07 NOTE — Telephone Encounter (Signed)
ok 

## 2012-02-07 NOTE — Telephone Encounter (Signed)
rx faxed to pharmacy

## 2012-06-11 ENCOUNTER — Ambulatory Visit: Payer: BC Managed Care – PPO | Admitting: Internal Medicine

## 2012-06-11 ENCOUNTER — Other Ambulatory Visit: Payer: Self-pay | Admitting: Internal Medicine

## 2012-06-11 NOTE — Telephone Encounter (Signed)
Med filled.  

## 2012-07-01 ENCOUNTER — Ambulatory Visit: Payer: BC Managed Care – PPO | Admitting: Internal Medicine

## 2012-07-24 ENCOUNTER — Other Ambulatory Visit: Payer: Self-pay | Admitting: Internal Medicine

## 2012-07-28 ENCOUNTER — Ambulatory Visit (INDEPENDENT_AMBULATORY_CARE_PROVIDER_SITE_OTHER): Payer: BC Managed Care – PPO | Admitting: Internal Medicine

## 2012-07-28 ENCOUNTER — Encounter: Payer: Self-pay | Admitting: Internal Medicine

## 2012-07-28 VITALS — BP 130/84 | HR 96 | Temp 98.4°F | Resp 20 | Wt 340.0 lb

## 2012-07-28 DIAGNOSIS — Z23 Encounter for immunization: Secondary | ICD-10-CM

## 2012-07-28 DIAGNOSIS — E669 Obesity, unspecified: Secondary | ICD-10-CM

## 2012-07-28 DIAGNOSIS — N189 Chronic kidney disease, unspecified: Secondary | ICD-10-CM

## 2012-07-28 DIAGNOSIS — I10 Essential (primary) hypertension: Secondary | ICD-10-CM

## 2012-07-28 DIAGNOSIS — E7800 Pure hypercholesterolemia, unspecified: Secondary | ICD-10-CM

## 2012-07-28 MED ORDER — INDOMETHACIN 50 MG PO CAPS: 50.0000 mg | ORAL_CAPSULE | Freq: Three times a day (TID) | ORAL | Status: DC

## 2012-07-28 NOTE — Patient Instructions (Signed)
Limit your sodium (Salt) intake    It is important that you exercise regularly, at least 20 minutes 3 to 4 times per week.  If you develop chest pain or shortness of breath seek  medical attention.  You need to lose weight.  Consider a lower calorie diet and regular exercise.  Please check your blood pressure on a regular basis.  If it is consistently greater than 150/90, please make an office appointment.

## 2012-07-28 NOTE — Progress Notes (Signed)
Subjective:    Patient ID: Alex Williams, male    DOB: 03/26/1951, 62 y.o.   MRN: [id number]  HPI   62 year old patient who is seen today for his six-month followup. He has history of hypertension dyslipidemia exogenous obesity. He also has a history of chronic kidney disease with his last creatinine 1.9. He feels well today. He plans on initiating a diet  Past Medical History  Diagnosis Date  . COLONIC POLYPS, HX OF 06/27/2007  . EXOGENOUS OBESITY 01/30/2010  . GOUT 01/30/2010  . HYPERCHOLESTEROLEMIA 06/30/2007  . HYPERTENSION 06/27/2007  . LOW BACK PAIN 06/27/2007  . NEPHROLITHIASIS, HX OF 06/27/2007  . OSTEOARTHRITIS 06/27/2007  . SLEEP APNEA, OBSTRUCTIVE, MODERATE 01/27/2009  . Cervical disc disease     History   Social History  . Marital Status: Married    Spouse Name: N/A    Number of Children: N/A  . Years of Education: N/A   Occupational History  . Not on file.   Social History Main Topics  . Smoking status: Former Smoker    Quit date: 07/23/1980  . Smokeless tobacco: Never Used  . Alcohol Use: Yes  . Drug Use: No  . Sexually Active: Not on file   Other Topics Concern  . Not on file   Social History Narrative  . No narrative on file    Past Surgical History  Procedure Date  . Lumbar laminectomy   . Total knee arthroplasty     left  . Cardiac catheterization     Family History  Problem Relation Age of Onset  . Diabetes Neg Hx     family sisters x6 hx of    Allergies  Allergen Reactions  . Codeine Phosphate     Current Outpatient Prescriptions on File Prior to Visit  Medication Sig Dispense Refill  . aspirin 81 MG tablet Take 81 mg by mouth daily.        . benazepril (LOTENSIN) 40 MG tablet TAKE 1 TABLET BY MOUTH EVERY DAY  90 tablet  3  . CRESTOR 5 MG tablet TAKE 1 TABLET BY MOUTH EVERY DAY  90 tablet  2  . Fish Oil-Cholecalciferol (OMEGA-3 FISH OIL/VITAMIN D3 PO) Take by mouth daily.        . pantoprazole (PROTONIX) 40 MG tablet TAKE 1 TABLET EVERY  DAY  90 tablet  3  . Testosterone (ANDROGEL) 40.5 MG/2.5GM (1.62%) GEL Place 2 Act onto the skin daily.  2.5 g  3  . traMADol (ULTRAM) 50 MG tablet Take 1 tablet (50 mg total) by mouth every 6 (six) hours as needed for pain. Maximum dose= 8 tablets per day  90 tablet  6  . tadalafil (CIALIS) 5 MG tablet Take 1 tablet (5 mg total) by mouth daily as needed for erectile dysfunction.  30 tablet  6    BP 130/84  Pulse 96  Temp 98.4 F (36.9 C) (Oral)  Resp 20  Wt 340 lb (154.223 kg)  SpO2 97%      Review of Systems  Constitutional: Negative for fever, chills, appetite change and fatigue.  HENT: Negative for hearing loss, ear pain, congestion, sore throat, trouble swallowing, neck stiffness, dental problem, voice change and tinnitus.   Eyes: Negative for pain, discharge and visual disturbance.  Respiratory: Negative for cough, chest tightness, wheezing and stridor.   Cardiovascular: Negative for chest pain, palpitations and leg swelling.  Gastrointestinal: Negative for nausea, vomiting, abdominal pain, diarrhea, constipation, blood in stool and abdominal distention.  Genitourinary: Negative  for urgency, hematuria, flank pain, discharge, difficulty urinating and genital sores.  Musculoskeletal: Negative for myalgias, back pain, joint swelling, arthralgias and gait problem.       Occasional episodes of he describes as gout involving a great toe ; he is requesting a prescription for Indocin which has worked well in the past  Skin: Negative for rash.  Neurological: Negative for dizziness, syncope, speech difficulty, weakness, numbness and headaches.  Hematological: Negative for adenopathy. Does not bruise/bleed easily.  Psychiatric/Behavioral: Negative for behavioral problems and dysphoric mood. The patient is not nervous/anxious.        Objective:   Physical Exam  Constitutional: He is oriented to person, place, and time. He appears well-developed.       Repeat blood pressure  110/70 Weight 340  HENT:  Head: Normocephalic.  Right Ear: External ear normal.  Left Ear: External ear normal.  Eyes: Conjunctivae normal and EOM are normal.  Neck: Normal range of motion.  Cardiovascular: Normal rate and normal heart sounds.   Pulmonary/Chest: Breath sounds normal.  Abdominal: Bowel sounds are normal.  Musculoskeletal: Normal range of motion. He exhibits no edema and no tenderness.  Neurological: He is alert and oriented to person, place, and time.  Psychiatric: He has a normal mood and affect. His behavior is normal.          Assessment & Plan:   Hypertension well controlled Dyslipidemia Chronic kidney disease. The patient was given a prescription for Indocin but will take no longer than 24 hours. Dangers of chronic NSAID use discussed Exogenous obesity. Weight loss encouraged  CPX 4 months

## 2012-08-21 ENCOUNTER — Other Ambulatory Visit: Payer: Self-pay | Admitting: Internal Medicine

## 2012-09-29 ENCOUNTER — Ambulatory Visit (INDEPENDENT_AMBULATORY_CARE_PROVIDER_SITE_OTHER): Payer: BC Managed Care – PPO | Admitting: Internal Medicine

## 2012-09-29 ENCOUNTER — Encounter: Payer: Self-pay | Admitting: Internal Medicine

## 2012-09-29 VITALS — BP 130/84 | HR 119 | Temp 98.1°F | Resp 20 | Wt 337.0 lb

## 2012-09-29 DIAGNOSIS — I10 Essential (primary) hypertension: Secondary | ICD-10-CM

## 2012-09-29 DIAGNOSIS — R05 Cough: Secondary | ICD-10-CM

## 2012-09-29 DIAGNOSIS — G4733 Obstructive sleep apnea (adult) (pediatric): Secondary | ICD-10-CM

## 2012-09-29 DIAGNOSIS — R059 Cough, unspecified: Secondary | ICD-10-CM

## 2012-09-29 DIAGNOSIS — R062 Wheezing: Secondary | ICD-10-CM

## 2012-09-29 MED ORDER — METHYLPREDNISOLONE ACETATE 80 MG/ML IJ SUSP
80.0000 mg | Freq: Once | INTRAMUSCULAR | Status: AC
  Administered 2012-09-29: 80 mg via INTRAMUSCULAR

## 2012-09-29 NOTE — Progress Notes (Signed)
Subjective:    Patient ID: Alex Williams, male    DOB: [birth year], 62 y.o.   MRN: [id number]  HPI  62 year old patient who has a history of obstructive sleep apnea he presents with a 7 to ten-day history of extreme sinus congestion. He has been unable to tolerate CPAP do to the sinus congestion. He has been quite miserable. He has been expectorating yellow sputum;  Past Medical History  Diagnosis Date  . COLONIC POLYPS, HX OF 06/27/2007  . EXOGENOUS OBESITY 01/30/2010  . GOUT 01/30/2010  . HYPERCHOLESTEROLEMIA 06/30/2007  . HYPERTENSION 06/27/2007  . LOW BACK PAIN 06/27/2007  . NEPHROLITHIASIS, HX OF 06/27/2007  . OSTEOARTHRITIS 06/27/2007  . SLEEP APNEA, OBSTRUCTIVE, MODERATE 01/27/2009  . Cervical disc disease     History   Social History  . Marital Status: Married    Spouse Name: N/A    Number of Children: N/A  . Years of Education: N/A   Occupational History  . Not on file.   Social History Main Topics  . Smoking status: Former Smoker    Quit date: 07/23/1980  . Smokeless tobacco: Never Used  . Alcohol Use: Yes  . Drug Use: No  . Sexually Active: Not on file   Other Topics Concern  . Not on file   Social History Narrative  . No narrative on file    Past Surgical History  Procedure Laterality Date  . Lumbar laminectomy    . Total knee arthroplasty      left  . Cardiac catheterization      Family History  Problem Relation Age of Onset  . Diabetes Neg Hx     family sisters x6 hx of    Allergies  Allergen Reactions  . Codeine Phosphate     Current Outpatient Prescriptions on File Prior to Visit  Medication Sig Dispense Refill  . amLODipine (NORVASC) 5 MG tablet TAKE 1 TABLET BY MOUTH EVERY DAY  90 tablet  1  . aspirin 81 MG tablet Take 81 mg by mouth daily.        . benazepril (LOTENSIN) 40 MG tablet TAKE 1 TABLET BY MOUTH EVERY DAY  90 tablet  3  . CRESTOR 5 MG tablet TAKE 1 TABLET BY MOUTH EVERY DAY  90 tablet  2  . Fish Oil-Cholecalciferol (OMEGA-3 FISH  OIL/VITAMIN D3 PO) Take by mouth daily.        . indomethacin (INDOCIN) 50 MG capsule Take 1 capsule (50 mg total) by mouth 3 (three) times daily with meals.  30 capsule  2  . Multiple Vitamins-Minerals (ONE-A-DAY MENS 50+ ADVANTAGE PO) Take 1 tablet by mouth daily.      . pantoprazole (PROTONIX) 40 MG tablet TAKE 1 TABLET EVERY DAY  90 tablet  3  . Testosterone (ANDROGEL) 40.5 MG/2.5GM (1.62%) GEL Place 2 Act onto the skin daily.  2.5 g  3  . traMADol (ULTRAM) 50 MG tablet Take 1 tablet (50 mg total) by mouth every 6 (six) hours as needed for pain. Maximum dose= 8 tablets per day  90 tablet  6  . tadalafil (CIALIS) 5 MG tablet Take 1 tablet (5 mg total) by mouth daily as needed for erectile dysfunction.  30 tablet  6   No current facility-administered medications on file prior to visit.    BP 130/84  Pulse 119  Temp(Src) 98.1 F (36.7 C) (Oral)  Resp 20  Wt 337 lb (152.862 kg)  BMI 47.02 kg/m2  SpO2 95%   he has  also had some intermittent wheezing.    Review of Systems  Constitutional: Positive for fatigue.  HENT: Positive for nosebleeds, congestion and sinus pressure.   Respiratory: Positive for cough, shortness of breath and wheezing.        Objective:   Physical Exam  Constitutional: He is oriented to person, place, and time. He appears well-developed.  HENT:  Head: Normocephalic.  Right Ear: External ear normal.  Left Ear: External ear normal.  Nasal mucosal congestion  Pharyngeal crowding  Eyes: Conjunctivae and EOM are normal.  Neck: Normal range of motion.  Cardiovascular: Normal rate and normal heart sounds.   Pulmonary/Chest: Breath sounds normal.  Abdominal: Bowel sounds are normal.  Musculoskeletal: Normal range of motion. He exhibits no edema and no tenderness.  Neurological: He is alert and oriented to person, place, and time.  Psychiatric: He has a normal mood and affect. His behavior is normal.          Assessment & Plan:   URI/sinusitis. Will  treat aggressively with the Depo-Medrol antibiotics expectorants decongestants. Nasal irrigation and humidified air discussed Hypertension OSA

## 2012-09-29 NOTE — Patient Instructions (Addendum)
Take your antibiotic as prescribed until ALL of it is gone, but stop if you develop a rash, swelling, or any side effects of the medication.  Contact our office as soon as possible if  there are side effects of the medication.  (Avelox  1 tablet daily)    Use saline irrigation, warm  moist compresses and over-the-counter decongestants only as directed.  Call if there is no improvement in 5 to 7 days, or sooner if you develop increasing pain, fever, or any new symptoms. (Consider Mucinex D twice daily)

## 2013-01-19 ENCOUNTER — Other Ambulatory Visit (INDEPENDENT_AMBULATORY_CARE_PROVIDER_SITE_OTHER): Payer: BC Managed Care – PPO

## 2013-01-19 DIAGNOSIS — Z0000 Encounter for general adult medical examination without abnormal findings: Secondary | ICD-10-CM

## 2013-01-19 LAB — CBC WITH DIFFERENTIAL/PLATELET
Basophils Absolute: 0 10*3/uL (ref 0.0–0.1)
Basophils Relative: 0.5 % (ref 0.0–3.0)
Eosinophils Absolute: 0.2 10*3/uL (ref 0.0–0.7)
Lymphocytes Relative: 27.8 % (ref 12.0–46.0)
MCHC: 33.3 g/dL (ref 30.0–36.0)
Neutrophils Relative %: 61 % (ref 43.0–77.0)
Platelets: 224 10*3/uL (ref 150.0–400.0)
RBC: 4.53 Mil/uL (ref 4.22–5.81)
RDW: 15 % — ABNORMAL HIGH (ref 11.5–14.6)

## 2013-01-19 LAB — LIPID PANEL
Cholesterol: 197 mg/dL (ref 0–200)
HDL: 37.4 mg/dL — ABNORMAL LOW (ref >39.00)
LDL Cholesterol: 123 mg/dL — ABNORMAL HIGH (ref 0–99)
Triglycerides: 183 mg/dL — ABNORMAL HIGH (ref 0.0–149.0)
VLDL: 36.6 mg/dL (ref 0.0–40.0)

## 2013-01-19 LAB — POCT URINALYSIS DIPSTICK
Blood, UA: NEGATIVE
Ketones, UA: NEGATIVE
Spec Grav, UA: 1.02
Urobilinogen, UA: 0.2

## 2013-01-19 LAB — HEPATIC FUNCTION PANEL
AST: 17 U/L (ref 0–37)
Albumin: 3.9 g/dL (ref 3.5–5.2)
Alkaline Phosphatase: 77 U/L (ref 39–117)
Bilirubin, Direct: 0.2 mg/dL (ref 0.0–0.3)
Total Protein: 6.7 g/dL (ref 6.0–8.3)

## 2013-01-19 LAB — BASIC METABOLIC PANEL
Chloride: 115 mEq/L — ABNORMAL HIGH (ref 96–112)
GFR: 42.62 mL/min — ABNORMAL LOW (ref >60.00)
Potassium: 6.3 mEq/L (ref 3.5–5.1)
Sodium: 141 mEq/L (ref 135–145)

## 2013-01-20 ENCOUNTER — Telehealth: Payer: Self-pay | Admitting: Internal Medicine

## 2013-01-20 MED ORDER — AMLODIPINE BESYLATE 10 MG PO TABS: 10.0000 mg | ORAL_TABLET | Freq: Every day | ORAL | Status: DC

## 2013-01-20 NOTE — Telephone Encounter (Signed)
Pt returning your call

## 2013-01-20 NOTE — Telephone Encounter (Signed)
See result message.

## 2013-01-26 ENCOUNTER — Encounter: Payer: Self-pay | Admitting: Internal Medicine

## 2013-01-26 ENCOUNTER — Ambulatory Visit (INDEPENDENT_AMBULATORY_CARE_PROVIDER_SITE_OTHER): Payer: BC Managed Care – PPO | Admitting: Internal Medicine

## 2013-01-26 VITALS — BP 136/80

## 2013-01-26 DIAGNOSIS — G4733 Obstructive sleep apnea (adult) (pediatric): Secondary | ICD-10-CM

## 2013-01-26 DIAGNOSIS — I10 Essential (primary) hypertension: Secondary | ICD-10-CM

## 2013-01-26 DIAGNOSIS — E7800 Pure hypercholesterolemia, unspecified: Secondary | ICD-10-CM

## 2013-01-26 DIAGNOSIS — M1990 Unspecified osteoarthritis, unspecified site: Secondary | ICD-10-CM

## 2013-01-26 LAB — BASIC METABOLIC PANEL
CO2: 25 mEq/L (ref 19–32)
Calcium: 10.1 mg/dL (ref 8.4–10.5)
Chloride: 111 mEq/L (ref 96–112)
Creatinine, Ser: 1.7 mg/dL — ABNORMAL HIGH (ref 0.4–1.5)
Glucose, Bld: 90 mg/dL (ref 70–99)
Sodium: 141 mEq/L (ref 135–145)

## 2013-01-26 NOTE — Patient Instructions (Signed)
Limit your sodium (Salt) intake  You need to lose weight.  Consider a lower calorie diet and regular exercise.    It is important that you exercise regularly, at least 20 minutes 3 to 4 times per week.  If you develop chest pain or shortness of breath seek  medical attention.  Return in 6 months for follow-up

## 2013-01-26 NOTE — Progress Notes (Signed)
Subjective:    Patient ID: Alex Williams, male    DOB: 01/03/1951, 62 y.o.   MRN: [id number]  HPI  62 year old patient who is seen today for a wellness exam. Chronic medical problems include treated hypertension. He has history of chronic kidney disease and dyslipidemia. He states that he takes statin therapy about 3 times per week only.  Wt Readings from Last 3 Encounters:  09/29/12 337 lb (152.862 kg)  07/28/12 340 lb (154.223 kg)  12/10/11 330 lb (149.687 kg)   Result laboratory studies were reviewed. Patient was noted to have hyperkalemia and ACE inhibition has been on hold. Do to a history of chronic kidney disease he avoids anti-inflammatory medications. Creatinine has been in the 1.7-1.9 range for the past few years. Most recent creatinine 1.7. Amlodipine increased to 10 mg daily after Lotensin placed on hold.  He has a history colonic polyps and underwent colonoscopy in November of 2012  Past Medical History  Diagnosis Date  . COLONIC POLYPS, HX OF 06/27/2007  . EXOGENOUS OBESITY 01/30/2010  . GOUT 01/30/2010  . HYPERCHOLESTEROLEMIA 06/30/2007  . HYPERTENSION 06/27/2007  . LOW BACK PAIN 06/27/2007  . NEPHROLITHIASIS, HX OF 06/27/2007  . OSTEOARTHRITIS 06/27/2007  . SLEEP APNEA, OBSTRUCTIVE, MODERATE 01/27/2009  . Cervical disc disease     History   Social History  . Marital Status: Married    Spouse Name: N/A    Number of Children: N/A  . Years of Education: N/A   Occupational History  . Not on file.   Social History Main Topics  . Smoking status: Former Smoker    Quit date: 07/23/1980  . Smokeless tobacco: Never Used  . Alcohol Use: Yes  . Drug Use: No  . Sexually Active: Not on file   Other Topics Concern  . Not on file   Social History Narrative  . No narrative on file    Past Surgical History  Procedure Laterality Date  . Lumbar laminectomy    . Total knee arthroplasty      left  . Cardiac catheterization      Family History  Problem Relation Age of  Onset  . Diabetes Neg Hx     family sisters x6 hx of    Allergies  Allergen Reactions  . Codeine Phosphate     Current Outpatient Prescriptions on File Prior to Visit  Medication Sig Dispense Refill  . amLODipine (NORVASC) 10 MG tablet Take 1 tablet (10 mg total) by mouth daily.  90 tablet  1  . aspirin 81 MG tablet Take 81 mg by mouth daily.        . benazepril (LOTENSIN) 40 MG tablet TAKE 1 TABLET BY MOUTH EVERY DAY  90 tablet  3  . CRESTOR 5 MG tablet TAKE 1 TABLET BY MOUTH EVERY DAY  90 tablet  2  . Fish Oil-Cholecalciferol (OMEGA-3 FISH OIL/VITAMIN D3 PO) Take by mouth daily.        . Multiple Vitamins-Minerals (ONE-A-DAY MENS 50+ ADVANTAGE PO) Take 1 tablet by mouth daily.      . pantoprazole (PROTONIX) 40 MG tablet TAKE 1 TABLET EVERY DAY  90 tablet  3  . indomethacin (INDOCIN) 50 MG capsule Take 1 capsule (50 mg total) by mouth 3 (three) times daily with meals.  30 capsule  2  . tadalafil (CIALIS) 5 MG tablet Take 1 tablet (5 mg total) by mouth daily as needed for erectile dysfunction.  30 tablet  6   No current facility-administered medications on  file prior to visit.    BP 136/80        Review of Systems  Constitutional: Positive for unexpected weight change. Negative for fever, chills, activity change, appetite change and fatigue.  HENT: Negative for hearing loss, ear pain, congestion, rhinorrhea, sneezing, mouth sores, trouble swallowing, neck pain, neck stiffness, dental problem, voice change, sinus pressure and tinnitus.   Eyes: Negative for photophobia, pain, redness and visual disturbance.  Respiratory: Negative for apnea, cough, choking, chest tightness, shortness of breath and wheezing.   Cardiovascular: Negative for chest pain, palpitations and leg swelling.  Gastrointestinal: Negative for nausea, vomiting, abdominal pain, diarrhea, constipation, blood in stool, abdominal distention, anal bleeding and rectal pain.  Genitourinary: Negative for dysuria,  urgency, frequency, hematuria, flank pain, decreased urine volume, discharge, penile swelling, scrotal swelling, difficulty urinating, genital sores and testicular pain.  Musculoskeletal: Positive for back pain and arthralgias. Negative for myalgias, joint swelling and gait problem.  Skin: Negative for color change, rash and wound.  Neurological: Negative for dizziness, tremors, seizures, syncope, facial asymmetry, speech difficulty, weakness, light-headedness, numbness and headaches.  Hematological: Negative for adenopathy. Does not bruise/bleed easily.  Psychiatric/Behavioral: Negative for suicidal ideas, hallucinations, behavioral problems, confusion, sleep disturbance, self-injury, dysphoric mood, decreased concentration and agitation. The patient is not nervous/anxious.        Objective:   Physical Exam  Constitutional: He appears well-developed and well-nourished.  Blood pressure 136/80 Weight 342  HENT:  Head: Normocephalic and atraumatic.  Right Ear: External ear normal.  Left Ear: External ear normal.  Nose: Nose normal.  Mouth/Throat: Oropharynx is clear and moist.  Eyes: Conjunctivae and EOM are normal. Pupils are equal, round, and reactive to light. No scleral icterus.  Neck: Normal range of motion. Neck supple. No JVD present. No thyromegaly present.  Cardiovascular: Regular rhythm, normal heart sounds and intact distal pulses.  Exam reveals no gallop and no friction rub.   No murmur heard. Pulmonary/Chest: Effort normal and breath sounds normal. He exhibits no tenderness.  Abdominal: Soft. Bowel sounds are normal. He exhibits no distension and no mass. There is no tenderness.  Genitourinary: Prostate normal and penis normal.  Uncircumcised External hemorrhoid  Musculoskeletal: Normal range of motion. He exhibits no edema and no tenderness.  Slight pedal edema associated with sunburn  Lymphadenopathy:    He has no cervical adenopathy.  Neurological: He is alert. He has  normal reflexes. No cranial nerve deficit. Coordination normal.  Skin: Skin is warm and dry. No rash noted.  Psychiatric: He has a normal mood and affect. His behavior is normal.          Assessment & Plan:   Hypertension Chronic kidney disease with hyperkalemia. Lotensin has been on hold. We'll repeat the electrolytes today. Referred to nephrology Chronic anemia. Probably secondary to chronic kidney disease Morbid obesity Chronic low back pain Colonic polyps History of nephrolithiasis OSA History of gout Dyslipidemia Minimal coronary artery disease

## 2013-02-04 ENCOUNTER — Encounter: Payer: BC Managed Care – PPO | Attending: Internal Medicine | Admitting: Dietician

## 2013-02-04 ENCOUNTER — Encounter: Payer: Self-pay | Admitting: Dietician

## 2013-02-04 VITALS — Ht 71.5 in | Wt 339.5 lb

## 2013-02-04 DIAGNOSIS — E669 Obesity, unspecified: Secondary | ICD-10-CM

## 2013-02-04 DIAGNOSIS — Z713 Dietary counseling and surveillance: Secondary | ICD-10-CM | POA: Insufficient documentation

## 2013-02-04 NOTE — Patient Instructions (Addendum)
Start exercising in a pool [facility]) or walking around the block.  Limit bacon to one strip for breakfast.  Bring snacks to work such as cracker and peanut butter, fruit and cheese sticks, cut up vegetables,  nuts or seeds.   Buy cut up, cleaned vegetables for quick meals and snacks.   Look at food labels to limit sodium.

## 2013-02-04 NOTE — Progress Notes (Signed)
  Medical Nutrition Therapy:  Appt start time: 0800 end time:  0900.   Assessment:  Primary concerns today: Tried to lose weight for past 6-7 years and needs help. Has tried herbs such raspberry ketones, green tea, Duken Diet which worked really well. Gained 40 pounds or more after knee surgery which was 6-7 years ago, before that comfortable with weight 260-280 pounds.  Goal of at least 60 pound weight loss.   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 [place] so is not as available to help with meals.   Has difficulty exercising d/t hip and knee pain though used to be very active. Discussed exercising in a pool to help reduce pain in joints or just taking small walks at night around the block, bringing healthy snacks to work to eat throughout the day instead of or in addition to lunch if too busy to eat a meal. Also discussed reading food labels to pay attention to sodium in foods and limiting portions of high sodium foods such as bacon.   MEDICATIONS: See List   DIETARY INTAKE:  Prone to low blood sugar twice a month or so, will have Snickers, Hershey's, or Orange Juice in fridge in work   24-hr recall:  B ( AM): [employer] every morning 2 eggs, 3 strips of bacon with coffee and little sugar and water L (12-2 PM): salad or 6 inch sandwich from [employer] with water, skips lunch 2-3 times per week Snk ( PM): potato chips, freetos,  Cheetos, nabs  D ( PM): salad with ranch, tomatoes, onions, cheese OR  pizza (once a week) OR meat and vegetables, potatoes, green beans, corn occassionally 1% milk or coke  Snk ( PM):half gallon milk sometimes, popsicles, sweets, cookies (not often)  Beverages: 7-8 bottles water per day, crystal light, diet soda  Usual physical activity: very little, play golf occasionally, active job, difficulty walking far  Estimated energy needs: 2000 calories 225 g  carbohydrates 150 g protein 56 g fat  Progress Towards Goal(s):  In progress.   Nutritional Diagnosis:  NB-1.1 Food and nutrition-related knowledge deficit As related to frequent meal skipping, high fat, calorie, and sodium food choices.  As evidenced by 40 pound weight gain in past 7 years. .    Intervention:  Nutrition counseling included:  Start exercising in a pool [facility]) or walking around the block.  Limit bacon to one strip for breakfast.  Bring snacks to work such as crackers and peanut butter, fruit and cheese sticks, cut up vegetables,  nuts or seeds.   Buy cut up, cleaned vegetables for quick meals and snacks.   Look at food labels to limit sodium.    Handouts given during visit include:  MyPlate placemat  Weight Loss Tips from eatright.org  Chronic Kidney Disease Tips from eatright.org  Monitoring/Evaluation:  Dietary intake, exercise, and body weight in 8 week(s).

## 2013-02-13 ENCOUNTER — Other Ambulatory Visit: Payer: Self-pay | Admitting: Internal Medicine

## 2013-02-13 ENCOUNTER — Other Ambulatory Visit: Payer: Self-pay | Admitting: *Deleted

## 2013-02-13 MED ORDER — TRAMADOL HCL 50 MG PO TABS: ORAL_TABLET | ORAL | Status: DC

## 2013-02-13 NOTE — Telephone Encounter (Signed)
Rx for Tramadol called into pharmacy.

## 2013-02-25 ENCOUNTER — Other Ambulatory Visit: Payer: Self-pay | Admitting: Internal Medicine

## 2013-03-16 DIAGNOSIS — R600 Localized edema: Secondary | ICD-10-CM | POA: Insufficient documentation

## 2013-04-01 ENCOUNTER — Ambulatory Visit: Payer: BC Managed Care – PPO | Admitting: Dietician

## 2013-06-12 ENCOUNTER — Other Ambulatory Visit: Payer: Self-pay | Admitting: Internal Medicine

## 2013-07-18 ENCOUNTER — Other Ambulatory Visit: Payer: Self-pay | Admitting: Internal Medicine

## 2013-07-29 ENCOUNTER — Ambulatory Visit (INDEPENDENT_AMBULATORY_CARE_PROVIDER_SITE_OTHER): Payer: BC Managed Care – PPO | Admitting: Internal Medicine

## 2013-07-29 ENCOUNTER — Encounter: Payer: Self-pay | Admitting: Internal Medicine

## 2013-07-29 VITALS — BP 136/90 | HR 113 | Temp 98.8°F | Resp 20 | Ht 71.0 in | Wt 336.5 lb

## 2013-07-29 DIAGNOSIS — N189 Chronic kidney disease, unspecified: Secondary | ICD-10-CM

## 2013-07-29 DIAGNOSIS — E7800 Pure hypercholesterolemia, unspecified: Secondary | ICD-10-CM

## 2013-07-29 DIAGNOSIS — G4733 Obstructive sleep apnea (adult) (pediatric): Secondary | ICD-10-CM

## 2013-07-29 DIAGNOSIS — I10 Essential (primary) hypertension: Secondary | ICD-10-CM

## 2013-07-29 DIAGNOSIS — Z23 Encounter for immunization: Secondary | ICD-10-CM

## 2013-07-29 MED ORDER — AMLODIPINE BESYLATE 10 MG PO TABS: 10.0000 mg | ORAL_TABLET | Freq: Every day | ORAL | Status: DC

## 2013-07-29 NOTE — Progress Notes (Signed)
Pre-visit discussion using our clinic review tool. No additional management support is needed unless otherwise documented below in the visit note.  

## 2013-07-29 NOTE — Patient Instructions (Signed)

## 2013-07-29 NOTE — Progress Notes (Signed)
Subjective:    Patient ID: Alex Williams, male    DOB: 04/08/1951, 63 y.o.   MRN: [id number]  HPI  63 year old patient who is in today for his six-month followup. He has been referred to renal medicine due to chronic kidney disease. He is treated hypertension. Grandmother has been a mixup with his medications and has been on amlodipine 5 mg only daily he currently is doing quite well. He has OSA and needs a new CPAP machine. He has dyslipidemia. He has morbid obesity  Past Medical History  Diagnosis Date  . COLONIC POLYPS, HX OF 06/27/2007  . EXOGENOUS OBESITY 01/30/2010  . GOUT 01/30/2010  . HYPERCHOLESTEROLEMIA 06/30/2007  . HYPERTENSION 06/27/2007  . LOW BACK PAIN 06/27/2007  . NEPHROLITHIASIS, HX OF 06/27/2007  . OSTEOARTHRITIS 06/27/2007  . SLEEP APNEA, OBSTRUCTIVE, MODERATE 01/27/2009  . Cervical disc disease     History   Social History  . Marital Status: Married    Spouse Name: N/A    Number of Children: N/A  . Years of Education: N/A   Occupational History  . Not on file.   Social History Main Topics  . Smoking status: Former Smoker    Quit date: 07/23/1980  . Smokeless tobacco: Never Used  . Alcohol Use: Yes  . Drug Use: No  . Sexual Activity: Not on file   Other Topics Concern  . Not on file   Social History Narrative  . No narrative on file    Past Surgical History  Procedure Laterality Date  . Lumbar laminectomy    . Total knee arthroplasty      left  . Cardiac catheterization      Family History  Problem Relation Age of Onset  . Diabetes Neg Hx     family sisters x6 hx of  . Hypertension Other   . Sleep apnea Other     Allergies  Allergen Reactions  . Codeine Phosphate     Current Outpatient Prescriptions on File Prior to Visit  Medication Sig Dispense Refill  . aspirin 81 MG tablet Take 81 mg by mouth daily.        . benazepril (LOTENSIN) 40 MG tablet TAKE 1 TABLET BY MOUTH EVERY DAY  90 tablet  3  . cetirizine (ZYRTEC) 10 MG tablet Take  10 mg by mouth daily.      . Fish Oil-Cholecalciferol (OMEGA-3 FISH OIL/VITAMIN D3 PO) Take by mouth daily.        . Multiple Vitamins-Minerals (ONE-A-DAY MENS 50+ ADVANTAGE PO) Take 1 tablet by mouth daily.      . pantoprazole (PROTONIX) 40 MG tablet TAKE 1 TABLET EVERY DAY  90 tablet  3  . traMADol (ULTRAM) 50 MG tablet TAKE 1 TABLET BY MOUTH EVERY 6 HOURS AS NEEDED FOR PAIN (MAX*8/DAY)  90 tablet  3   No current facility-administered medications on file prior to visit.    BP 136/90  Pulse 113  Temp(Src) 98.8 F (37.1 C) (Oral)  Resp 20  Ht 5\' 11"  (1.803 m)  Wt 336 lb 8 oz (152.635 kg)  BMI 46.95 kg/m2  SpO2 97%       Review of Systems  Constitutional: Negative for fever, chills, appetite change and fatigue.  HENT: Negative for congestion, dental problem, ear pain, hearing loss, sore throat, tinnitus, trouble swallowing and voice change.   Eyes: Negative for pain, discharge and visual disturbance.  Respiratory: Negative for cough, chest tightness, wheezing and stridor.   Cardiovascular: Negative for chest pain,  palpitations and leg swelling.  Gastrointestinal: Negative for nausea, vomiting, abdominal pain, diarrhea, constipation, blood in stool and abdominal distention.  Genitourinary: Negative for urgency, hematuria, flank pain, discharge, difficulty urinating and genital sores.  Musculoskeletal: Negative for arthralgias, back pain, gait problem, joint swelling, myalgias and neck stiffness.  Skin: Negative for rash.  Neurological: Negative for dizziness, syncope, speech difficulty, weakness, numbness and headaches.  Hematological: Negative for adenopathy. Does not bruise/bleed easily.  Psychiatric/Behavioral: Negative for behavioral problems and dysphoric mood. The patient is not nervous/anxious.        Objective:   Physical Exam  Constitutional: He is oriented to person, place, and time. He appears well-developed.  Blood pressure 140/80  HENT:  Head: Normocephalic.    Right Ear: External ear normal.  Left Ear: External ear normal.  Eyes: Conjunctivae and EOM are normal.  Neck: Normal range of motion.  Cardiovascular: Normal rate and normal heart sounds.   Pulmonary/Chest: Breath sounds normal.  Abdominal: Bowel sounds are normal.  Musculoskeletal: Normal range of motion. He exhibits no edema and no tenderness.  Neurological: He is alert and oriented to person, place, and time.  Psychiatric: He has a normal mood and affect. His behavior is normal.          Assessment & Plan:   Hypertension Chronic kidney disease. Followup nephrology. OSA. Will obtain new CPAP machine  CPX 6 months

## 2013-08-11 ENCOUNTER — Telehealth: Payer: Self-pay | Admitting: Internal Medicine

## 2013-08-11 NOTE — Telephone Encounter (Signed)
[facility] will be sending pt's sleep apnea report . Pt needs report to get new machine. Would like you to send report to him : fax [phone]  Or email [email or web address]  or pt can pu  pls advise

## 2013-08-11 NOTE — Telephone Encounter (Signed)
Spoke to pt told him found copy of sleep study in chart and I received order from [facility] for CPAP. Will fax order for CPAP along with sleep study to them. Pt verbalized understanding. Order faxed to [facility].

## 2013-08-22 ENCOUNTER — Other Ambulatory Visit: Payer: Self-pay | Admitting: Internal Medicine

## 2013-09-02 ENCOUNTER — Other Ambulatory Visit: Payer: Self-pay | Admitting: Internal Medicine

## 2013-11-30 ENCOUNTER — Telehealth: Payer: Self-pay | Admitting: Internal Medicine

## 2013-11-30 NOTE — Telephone Encounter (Signed)
Called [facility] and left message EKG faxed over.

## 2013-11-30 NOTE — Telephone Encounter (Signed)
Pt is having Cyst removal from his kidney on 12/01/13.  Shirlean Mylar needs a recent EKG faxed to [phone].

## 2013-12-02 DIAGNOSIS — N2889 Other specified disorders of kidney and ureter: Secondary | ICD-10-CM | POA: Insufficient documentation

## 2013-12-03 DIAGNOSIS — Z6841 Body Mass Index (BMI) 40.0 and over, adult: Secondary | ICD-10-CM

## 2014-01-22 ENCOUNTER — Other Ambulatory Visit: Payer: Self-pay | Admitting: Internal Medicine

## 2014-01-26 ENCOUNTER — Ambulatory Visit (INDEPENDENT_AMBULATORY_CARE_PROVIDER_SITE_OTHER): Payer: BC Managed Care – PPO | Admitting: Internal Medicine

## 2014-01-26 ENCOUNTER — Encounter: Payer: Self-pay | Admitting: Internal Medicine

## 2014-01-26 VITALS — BP 140/86 | HR 96 | Temp 98.7°F | Resp 20 | Ht 71.0 in | Wt 344.0 lb

## 2014-01-26 DIAGNOSIS — G4733 Obstructive sleep apnea (adult) (pediatric): Secondary | ICD-10-CM

## 2014-01-26 DIAGNOSIS — E7800 Pure hypercholesterolemia, unspecified: Secondary | ICD-10-CM

## 2014-01-26 DIAGNOSIS — I10 Essential (primary) hypertension: Secondary | ICD-10-CM

## 2014-01-26 NOTE — Progress Notes (Signed)
Subjective:    Patient ID: Alex Williams, male    DOB: July 09, 1951, 63 y.o.   MRN: [id number]  HPI  63 year old patient seen for a six-month followup.  2 months ago.  He underwent right renal ablation for a 6 cm complex renal mass that was benign.  He has done quite well.  He has a history of hypertension and dyslipidemia.  He has exogenous obesity and OS 80.  In general doing quite well.  Past Medical History  Diagnosis Date  . COLONIC POLYPS, HX OF 06/27/2007  . EXOGENOUS OBESITY 01/30/2010  . GOUT 01/30/2010  . HYPERCHOLESTEROLEMIA 06/30/2007  . HYPERTENSION 06/27/2007  . LOW BACK PAIN 06/27/2007  . NEPHROLITHIASIS, HX OF 06/27/2007  . OSTEOARTHRITIS 06/27/2007  . SLEEP APNEA, OBSTRUCTIVE, MODERATE 01/27/2009  . Cervical disc disease     History   Social History  . Marital Status: Married    Spouse Name: N/A    Number of Children: N/A  . Years of Education: N/A   Occupational History  . Not on file.   Social History Main Topics  . Smoking status: Former Smoker    Quit date: 07/23/1980  . Smokeless tobacco: Never Used  . Alcohol Use: Yes  . Drug Use: No  . Sexual Activity: Not on file   Other Topics Concern  . Not on file   Social History Narrative  . No narrative on file    Past Surgical History  Procedure Laterality Date  . Lumbar laminectomy    . Total knee arthroplasty      left  . Cardiac catheterization      Family History  Problem Relation Age of Onset  . Diabetes Neg Hx     family sisters x6 hx of  . Hypertension Other   . Sleep apnea Other     Allergies  Allergen Reactions  . Codeine Phosphate     Current Outpatient Prescriptions on File Prior to Visit  Medication Sig Dispense Refill  . amLODipine (NORVASC) 10 MG tablet TAKE 1 TABLET BY MOUTH EVERY DAY  90 tablet  1  . aspirin 81 MG tablet Take 81 mg by mouth daily.        . benazepril (LOTENSIN) 40 MG tablet TAKE 1 TABLET BY MOUTH EVERY DAY  90 tablet  3  . cetirizine (ZYRTEC) 10 MG tablet  Take 10 mg by mouth daily.      . Fish Oil-Cholecalciferol (OMEGA-3 FISH OIL/VITAMIN D3 PO) Take by mouth daily.        . Multiple Vitamins-Minerals (ONE-A-DAY MENS 50+ ADVANTAGE PO) Take 1 tablet by mouth daily.      . pantoprazole (PROTONIX) 40 MG tablet TAKE 1 TABLET EVERY DAY  90 tablet  3  . rosuvastatin (CRESTOR) 5 MG tablet TAKE 1 TABLET BY MOUTH EVERY OTHER DAY      . traMADol (ULTRAM) 50 MG tablet TAKE 1 TABLET BY MOUTH EVERY 6 HOURS AS NEEDED FOR PAIN  90 tablet  2  . vitamin C (ASCORBIC ACID) 500 MG tablet Take 1,000 mg by mouth daily.       No current facility-administered medications on file prior to visit.    BP 140/86  Pulse 96  Temp(Src) 98.7 F (37.1 C) (Oral)  Resp 20  Ht 5\' 11"  (1.803 m)  Wt 344 lb (156.037 kg)  BMI 48.00 kg/m2  SpO2 95%       Review of Systems  Constitutional: Negative for fever, chills, appetite change and fatigue.  HENT: Negative for congestion, dental problem, ear pain, hearing loss, sore throat, tinnitus, trouble swallowing and voice change.   Eyes: Negative for pain, discharge and visual disturbance.  Respiratory: Negative for cough, chest tightness, wheezing and stridor.   Cardiovascular: Negative for chest pain, palpitations and leg swelling.  Gastrointestinal: Negative for nausea, vomiting, abdominal pain, diarrhea, constipation, blood in stool and abdominal distention.  Genitourinary: Negative for urgency, hematuria, flank pain, discharge, difficulty urinating and genital sores.  Musculoskeletal: Negative for arthralgias, back pain, gait problem, joint swelling, myalgias and neck stiffness.  Skin: Negative for rash.  Neurological: Negative for dizziness, syncope, speech difficulty, weakness, numbness and headaches.  Hematological: Negative for adenopathy. Does not bruise/bleed easily.  Psychiatric/Behavioral: Negative for behavioral problems and dysphoric mood. The patient is not nervous/anxious.        Objective:   Physical  Exam  Constitutional: He is oriented to person, place, and time. He appears well-developed.  Repeat blood pressure 120/84  HENT:  Head: Normocephalic.  Right Ear: External ear normal.  Left Ear: External ear normal.  Eyes: Conjunctivae and EOM are normal.  Neck: Normal range of motion.  Cardiovascular: Normal rate and normal heart sounds.   Pulmonary/Chest: Breath sounds normal.  Abdominal: Bowel sounds are normal.  Musculoskeletal: Normal range of motion. He exhibits edema. He exhibits no tenderness.  Trace edema  Neurological: He is alert and oriented to person, place, and time.  Psychiatric: He has a normal mood and affect. His behavior is normal.          Assessment & Plan:  Hypertension Dyslipidemia OSA Status post ablation for benign 6 cm right renal mass  CPX 6 months

## 2014-01-26 NOTE — Progress Notes (Signed)
Pre visit review using our clinic review tool, if applicable. No additional management support is needed unless otherwise documented below in the visit note. 

## 2014-01-26 NOTE — Patient Instructions (Signed)
Limit your sodium (Salt) intake    It is important that you exercise regularly, at least 20 minutes 3 to 4 times per week.  If you develop chest pain or shortness of breath seek  medical attention.  You need to lose weight.  Consider a lower calorie diet and regular exercise.  Return in 6 months for follow-up   

## 2014-03-28 ENCOUNTER — Ambulatory Visit (INDEPENDENT_AMBULATORY_CARE_PROVIDER_SITE_OTHER): Payer: BC Managed Care – PPO

## 2014-03-28 ENCOUNTER — Ambulatory Visit (INDEPENDENT_AMBULATORY_CARE_PROVIDER_SITE_OTHER): Payer: BC Managed Care – PPO | Admitting: Emergency Medicine

## 2014-03-28 ENCOUNTER — Other Ambulatory Visit: Payer: Self-pay | Admitting: Emergency Medicine

## 2014-03-28 VITALS — BP 140/90 | HR 101 | Temp 98.4°F | Resp 20 | Ht 71.0 in | Wt 335.1 lb

## 2014-03-28 DIAGNOSIS — M2391 Unspecified internal derangement of right knee: Secondary | ICD-10-CM

## 2014-03-28 DIAGNOSIS — M2390 Unspecified internal derangement of unspecified knee: Secondary | ICD-10-CM

## 2014-03-28 DIAGNOSIS — M25561 Pain in right knee: Secondary | ICD-10-CM

## 2014-03-28 DIAGNOSIS — M25569 Pain in unspecified knee: Secondary | ICD-10-CM

## 2014-03-28 MED ORDER — HYDROCODONE-ACETAMINOPHEN 5-325 MG PO TABS: 1.0000 | ORAL_TABLET | Freq: Four times a day (QID) | ORAL | Status: DC | PRN

## 2014-03-28 NOTE — Progress Notes (Signed)
   Subjective:    Patient ID: Alex Williams, male    DOB: [birth year], 63 y.o.   MRN: [id number]  HPI patient states that 10 days ago he bent down and injured his right knee. He is status post left total knee. He seemed to be improving and then on Wednesday of this week he bent down and was unable to get back up because of severe pain in his right knee. Feels as though his knee is locked at the    Review of Systems     Objective:   Physical Exam physical exam reveals a healed scar over the left knee. The patient is unable to get the right knee into full extension and lacks approximately 30 of full extension. There is no instability of the knee and there is a trace effusion. There are significant degenerative changes around the right knee .  [facility] reading (PRIMARY) by  Dr.Casie Sturgeon there are degenerative changes of the knee with a 1 x 1 cm loose body behind the patella. There is significant loss of the medial joint space        Assessment & Plan:  Patient given hydrocodone for pain. He can tolerate this medication. Referral made to [facility]. He was given crutches.

## 2014-03-28 NOTE — Patient Instructions (Signed)

## 2014-04-28 ENCOUNTER — Ambulatory Visit (INDEPENDENT_AMBULATORY_CARE_PROVIDER_SITE_OTHER): Payer: BC Managed Care – PPO | Admitting: Emergency Medicine

## 2014-04-28 VITALS — BP 152/90 | HR 92 | Temp 98.4°F | Resp 18 | Ht 71.5 in | Wt 347.0 lb

## 2014-04-28 DIAGNOSIS — S338XXA Sprain of other parts of lumbar spine and pelvis, initial encounter: Secondary | ICD-10-CM

## 2014-04-28 DIAGNOSIS — S39012A Strain of muscle, fascia and tendon of lower back, initial encounter: Secondary | ICD-10-CM

## 2014-04-28 MED ORDER — HYDROCODONE-ACETAMINOPHEN 5-325 MG PO TABS: 1.0000 | ORAL_TABLET | ORAL | Status: DC | PRN

## 2014-04-28 MED ORDER — CYCLOBENZAPRINE HCL 10 MG PO TABS: 10.0000 mg | ORAL_TABLET | Freq: Three times a day (TID) | ORAL | Status: DC | PRN

## 2014-04-28 MED ORDER — NAPROXEN SODIUM 550 MG PO TABS
550.0000 mg | ORAL_TABLET | Freq: Two times a day (BID) | ORAL | Status: AC
Start: 2014-04-28 — End: 2015-04-28

## 2014-04-28 NOTE — Patient Instructions (Signed)

## 2014-04-28 NOTE — Progress Notes (Signed)
[facility] [address], [place] [address] [phone]  Date:  04/28/2014   Name:  Alex Williams   DOB:  [birth year]   MRN:  [id number]  PCP:  Nyoka Cowden, MD    Chief Complaint: Back Pain   History of Present Illness:  Alex Williams is a 63 y.o. very pleasant male patient who presents with the following:  Says that he woke up yesterday with pain in his low back.  States the pain has worsened over the past 24 hours and prevented him from sleeping last night No radiation of pain and no neuro symptoms.  No history of overuse or injury. History of L5 lam. Says pain is worse on left than right. No improvement with over the counter medications or other home remedies.  Denies other complaint or health concern today.   Patient Active Problem List   Diagnosis Date Noted  . Chronic kidney disease 12/10/2011  . GOUT 01/30/2010  . EXOGENOUS OBESITY 01/30/2010  . SLEEP APNEA, OBSTRUCTIVE, MODERATE 01/27/2009  . HYPERCHOLESTEROLEMIA 06/30/2007  . HYPERTENSION 06/27/2007  . OSTEOARTHRITIS 06/27/2007  . LOW BACK PAIN 06/27/2007  . COLONIC POLYPS, HX OF 06/27/2007  . NEPHROLITHIASIS, HX OF 06/27/2007    Past Medical History  Diagnosis Date  . COLONIC POLYPS, HX OF 06/27/2007  . EXOGENOUS OBESITY 01/30/2010  . GOUT 01/30/2010  . HYPERCHOLESTEROLEMIA 06/30/2007  . HYPERTENSION 06/27/2007  . LOW BACK PAIN 06/27/2007  . NEPHROLITHIASIS, HX OF 06/27/2007  . OSTEOARTHRITIS 06/27/2007  . SLEEP APNEA, OBSTRUCTIVE, MODERATE 01/27/2009  . Cervical disc disease     Past Surgical History  Procedure Laterality Date  . Lumbar laminectomy    . Total knee arthroplasty      left  . Cardiac catheterization    . Radiofrequency ablation kidney      History  Substance Use Topics  . Smoking status: Former Smoker    Quit date: 07/23/1980  . Smokeless tobacco: Never Used  . Alcohol Use: Yes    Family History  Problem Relation Age of Onset  . Diabetes Neg Hx      family sisters x6 hx of  . Hypertension Other   . Sleep apnea Other     Allergies  Allergen Reactions  . Codeine Phosphate     Medication list has been reviewed and updated.  Current Outpatient Prescriptions on File Prior to Visit  Medication Sig Dispense Refill  . amLODipine (NORVASC) 10 MG tablet TAKE 1 TABLET BY MOUTH EVERY DAY  90 tablet  1  . aspirin 81 MG tablet Take 81 mg by mouth daily.        . benazepril (LOTENSIN) 40 MG tablet TAKE 1 TABLET BY MOUTH EVERY DAY  90 tablet  3  . cetirizine (ZYRTEC) 10 MG tablet Take 10 mg by mouth daily.      . Fish Oil-Cholecalciferol (OMEGA-3 FISH OIL/VITAMIN D3 PO) Take by mouth daily.        Marland Kitchen HYDROcodone-acetaminophen (NORCO) 5-325 MG per tablet Take 1 tablet by mouth every 6 (six) hours as needed.  20 tablet  0  . indomethacin (INDOCIN) 50 MG capsule TAKE ONE CAPSULE 3 TIMES A DAY WITH MEALS AS NEEDED      . Multiple Vitamins-Minerals (ONE-A-DAY MENS 50+ ADVANTAGE PO) Take 1 tablet by mouth daily.      . pantoprazole (PROTONIX) 40 MG tablet TAKE 1 TABLET EVERY DAY  90 tablet  3  . rosuvastatin (CRESTOR) 5 MG tablet TAKE  1 TABLET BY MOUTH EVERY OTHER DAY      . traMADol (ULTRAM) 50 MG tablet TAKE 1 TABLET BY MOUTH EVERY 6 HOURS AS NEEDED FOR PAIN  90 tablet  2  . vitamin C (ASCORBIC ACID) 500 MG tablet Take 500 mg by mouth daily.        No current facility-administered medications on file prior to visit.    Review of Systems:  As per HPI, otherwise negative.    Physical Examination: Filed Vitals:   04/28/14 1452  BP: 152/90  Pulse: 92  Temp: 98.4 F (36.9 C)  Resp: 18   Filed Vitals:   04/28/14 1452  Height: 5' 11.5" (1.816 m)  Weight: 347 lb (157.398 kg)   Body mass index is 47.73 kg/(m^2). Ideal Body Weight: Weight in (lb) to have BMI = 25: 181.4   GEN: morbid obesity, NAD, Non-toxic, Alert & Oriented x 3 HEENT: Atraumatic, Normocephalic.  Ears and Nose: No external deformity. EXTR: No  clubbing/cyanosis/edema NEURO: Normal gait.  PSYCH: Normally interactive. Conversant. Not depressed or anxious appearing.  Calm demeanor.  BACK:  Tender lumbar paraspinous muscles bilaterally left more than right  Assessment and Plan: Lumbar strain Morbid obesity Hypertension Anaprox Flexeril vicodin  Signed,  Ellison Carwin, MD

## 2014-07-18 ENCOUNTER — Ambulatory Visit (INDEPENDENT_AMBULATORY_CARE_PROVIDER_SITE_OTHER): Payer: BC Managed Care – PPO | Admitting: Family Medicine

## 2014-07-18 VITALS — BP 122/76 | HR 86 | Temp 98.0°F | Resp 17 | Ht 71.5 in | Wt 338.0 lb

## 2014-07-18 DIAGNOSIS — J0100 Acute maxillary sinusitis, unspecified: Secondary | ICD-10-CM

## 2014-07-18 DIAGNOSIS — G4733 Obstructive sleep apnea (adult) (pediatric): Secondary | ICD-10-CM

## 2014-07-18 DIAGNOSIS — R059 Cough, unspecified: Secondary | ICD-10-CM

## 2014-07-18 DIAGNOSIS — Z9989 Dependence on other enabling machines and devices: Secondary | ICD-10-CM

## 2014-07-18 DIAGNOSIS — R05 Cough: Secondary | ICD-10-CM

## 2014-07-18 MED ORDER — AMOXICILLIN-POT CLAVULANATE 875-125 MG PO TABS: 1.0000 | ORAL_TABLET | Freq: Two times a day (BID) | ORAL | Status: DC

## 2014-07-18 MED ORDER — HYDROCOD POLST-CHLORPHEN POLST 10-8 MG/5ML PO LQCR: 5.0000 mL | Freq: Every evening | ORAL | Status: DC | PRN

## 2014-07-18 NOTE — Progress Notes (Signed)
Patient ID: [name], male   DOB: 05-27-1951, 63 y.o.   MRN: [id number]     This chart was scribed for Rikki Spearing, DO by [facility], ED Scribe at [facility].The patient was seen in exam room 05 and the patient's care was started at 9:10 AM.   Chief Complaint:  Chief Complaint  Patient presents with   Sinusitis   Cough   Nasal Congestion   Sore Throat   Chills    HPI: Alex Williams is a 63 y.o. male who is here for sinusitis, onset 2 weeks ago. Pt has had fever of 101 and chills, onset 1 day ago. Pt states he has pain around his nose and cheeks. He also has a  productive cough with yellow sputum, sore throat, intermittent and SOB as associated symptoms. Pain is worse late afternoon and at night, he has trouble sleeping due to his cough. He has taken tylenol and for little relief. Pt uses a C-pap machine. He denies ear pain, myalgias, CP, wheezing, swelling in his legs.  Pt is unsure if has gotten the flu shot this year.  Past Medical History  Diagnosis Date   COLONIC POLYPS, HX OF 06/27/2007   EXOGENOUS OBESITY 01/30/2010   GOUT 01/30/2010   HYPERCHOLESTEROLEMIA 06/30/2007   HYPERTENSION 06/27/2007   LOW BACK PAIN 06/27/2007   NEPHROLITHIASIS, HX OF 06/27/2007   OSTEOARTHRITIS 06/27/2007   SLEEP APNEA, OBSTRUCTIVE, MODERATE 01/27/2009   Cervical disc disease    Past Surgical History  Procedure Laterality Date   Lumbar laminectomy     Total knee arthroplasty      left   Cardiac catheterization     Radiofrequency ablation kidney     History   Social History   Marital Status: Married    Spouse Name: N/A    Number of Children: N/A   Years of Education: N/A   Social History Main Topics   Smoking status: Former Smoker    Quit date: 07/23/1980   Smokeless tobacco: Never Used   Alcohol Use: Yes   Drug Use: No   Sexual Activity: None   Other Topics Concern   None   Social History Narrative   Family History  Problem  Relation Age of Onset   Diabetes Neg Hx     family sisters x6 hx of   Hypertension Other    Sleep apnea Other    Allergies  Allergen Reactions   Codeine Phosphate    Prior to Admission medications   Medication Sig Start Date End Date Taking? Authorizing Provider  amLODipine (NORVASC) 10 MG tablet TAKE 1 TABLET BY MOUTH EVERY DAY   Yes Marletta Lor, MD  aspirin 81 MG tablet Take 81 mg by mouth daily.     Yes Historical Provider, MD  benazepril (LOTENSIN) 40 MG tablet TAKE 1 TABLET BY MOUTH EVERY DAY 07/18/13  Yes Marletta Lor, MD  cetirizine (ZYRTEC) 10 MG tablet Take 10 mg by mouth daily.   Yes Historical Provider, MD  cyclobenzaprine (FLEXERIL) 10 MG tablet Take 1 tablet (10 mg total) by mouth 3 (three) times daily as needed for muscle spasms. 04/28/14  Yes Roselee Culver, MD  Fish Oil-Cholecalciferol (OMEGA-3 FISH OIL/VITAMIN D3 PO) Take by mouth daily.     Yes Historical Provider, MD  HYDROcodone-acetaminophen (NORCO) 5-325 MG per tablet Take 1 tablet by mouth every 6 (six) hours as needed. 03/28/14  Yes Darlyne Russian, MD  HYDROcodone-acetaminophen ([place]) 5-325 MG  per tablet Take 1-2 tablets by mouth every 4 (four) hours as needed. 04/28/14  Yes Roselee Culver, MD  indomethacin (INDOCIN) 50 MG capsule TAKE ONE CAPSULE 3 TIMES A DAY WITH MEALS AS NEEDED   Yes Marletta Lor, MD  Multiple Vitamins-Minerals (ONE-A-DAY MENS 50+ ADVANTAGE PO) Take 1 tablet by mouth daily.   Yes Historical Provider, MD  naproxen sodium (ANAPROX DS) 550 MG tablet Take 1 tablet (550 mg total) by mouth 2 (two) times daily with a meal. 04/28/14 04/28/15 Yes Roselee Culver, MD  pantoprazole (PROTONIX) 40 MG tablet TAKE 1 TABLET EVERY DAY 06/12/13  Yes Marletta Lor, MD  rosuvastatin (CRESTOR) 5 MG tablet TAKE 1 TABLET BY MOUTH EVERY OTHER DAY 02/13/13  Yes Marletta Lor, MD  traMADol (ULTRAM) 50 MG tablet TAKE 1 TABLET BY MOUTH EVERY 6 HOURS AS NEEDED FOR PAIN 08/22/13  Yes  Marletta Lor, MD  vitamin C (ASCORBIC ACID) 500 MG tablet Take 500 mg by mouth daily.    Yes Historical Provider, MD     ROS: The patient denies night sweats, unintentional weight loss, chest pain, palpitations, wheezing, , nausea, vomiting, abdominal pain, dysuria, hematuria, melena, numbness, weakness, or tingling.   All other systems have been reviewed and were otherwise negative with the exception of those mentioned in the HPI and as above.    PHYSICAL EXAM: Filed Vitals:   07/18/14 0903  BP: 122/76  Pulse: 86  Temp: 98 F (36.7 C)  Resp: 17   SP02 97% Body mass index is 46.49 kg/(m^2).  General: Alert, no acute distress, morbidly obese HEENT:  Normocephalic, atraumatic, oropharynx patent. EOMI, PERRLA, TMs are erythematous without evidence of infection, no exudate, no tonsillar hypertrophy  Cardiovascular:  Regular rate and rhythm, no rubs murmurs or gallops.  No Carotid bruits, radial pulse intact. No pedal edema.  Respiratory: Clear to auscultation bilaterally.  No wheezes, rales, or rhonchi.  No cyanosis, no use of accessory musculature GI: No organomegaly, abdomen is soft and non-tender, positive bowel sounds.  No masses. Skin: No rashes. Neurologic: Facial musculature symmetric. Psychiatric: Patient is appropriate throughout our interaction. Lymphatic: No cervical lymphadenopathy Musculoskeletal: Gait intact.   LABS: Results for orders placed or performed in visit on [id number]  Basic metabolic panel  Result Value Ref Range   Sodium 141 135 - 145 mEq/L   Potassium 5.7 (H) 3.5 - 5.1 mEq/L   Chloride 111 96 - 112 mEq/L   CO2 25 19 - 32 mEq/L   Glucose, Bld 90 70 - 99 mg/dL   BUN 25 (H) 6 - 23 mg/dL   Creatinine, Ser 1.7 (H) 0.4 - 1.5 mg/dL   Calcium 10.1 8.4 - 10.5 mg/dL   GFR 44.70 (L) >60.00 mL/min     EKG/XRAY:   Primary read interpreted by Dr. Marin Comment at [facility].   ASSESSMENT/PLAN: 1. Acute maxillary sinusitis 2. Cough 3. OSA on C-pap  Prescribe  Augmentin PO BID x 10 days, Tussionex, OTC Delsym  PT declined chest x-ray, will return to office if symptoms worsen or as needed.  Low suspension for flu, declined flu swab  Gross sideeffects, risk and benefits, and alternatives of medications d/w patient. Patient is aware that all medications have potential sideeffects and we are unable to predict every sideeffect or drug-drug interaction that may occur.  Rikki Spearing, DO  07/18/2014 9:10 AM   I personally performed the services described in this documentation which is scribed in my presence and has been reviewed and  is accurate. Dr Marin Comment

## 2014-07-18 NOTE — Patient Instructions (Addendum)
NASACORT DAILY     Sinusitis Sinusitis is redness, soreness, and inflammation of the paranasal sinuses. Paranasal sinuses are air pockets within the bones of your face (beneath the eyes, the middle of the forehead, or above the eyes). In healthy paranasal sinuses, mucus is able to drain out, and air is able to circulate through them by way of your nose. However, when your paranasal sinuses are inflamed, mucus and air can become trapped. This can allow bacteria and other germs to grow and cause infection. Sinusitis can develop quickly and last only a short time (acute) or continue over a long period (chronic). Sinusitis that lasts for more than 12 weeks is considered chronic.  CAUSES  Causes of sinusitis include:  Allergies.  Structural abnormalities, such as displacement of the cartilage that separates your nostrils (deviated septum), which can decrease the air flow through your nose and sinuses and affect sinus drainage.  Functional abnormalities, such as when the small hairs (cilia) that line your sinuses and help remove mucus do not work properly or are not present. SIGNS AND SYMPTOMS  Symptoms of acute and chronic sinusitis are the same. The primary symptoms are pain and pressure around the affected sinuses. Other symptoms include:  Upper toothache.  Earache.  Headache.  Bad breath.  Decreased sense of smell and taste.  A cough, which worsens when you are lying flat.  Fatigue.  Fever.  Thick drainage from your nose, which often is green and may contain pus (purulent).  Swelling and warmth over the affected sinuses. DIAGNOSIS  Your health care provider will perform a physical exam. During the exam, your health care provider may:  Look in your nose for signs of abnormal growths in your nostrils (nasal polyps).  Tap over the affected sinus to check for signs of infection.  View the inside of your sinuses (endoscopy) using an imaging device that has a light attached  (endoscope). If your health care provider suspects that you have chronic sinusitis, one or more of the following tests may be recommended:  Allergy tests.  Nasal culture. A sample of mucus is taken from your nose, sent to a lab, and screened for bacteria.  Nasal cytology. A sample of mucus is taken from your nose and examined by your health care provider to determine if your sinusitis is related to an allergy. TREATMENT  Most cases of acute sinusitis are related to a viral infection and will resolve on their own within 10 days. Sometimes medicines are prescribed to help relieve symptoms (pain medicine, decongestants, nasal steroid sprays, or saline sprays).  However, for sinusitis related to a bacterial infection, your health care provider will prescribe antibiotic medicines. These are medicines that will help kill the bacteria causing the infection.  Rarely, sinusitis is caused by a fungal infection. In theses cases, your health care provider will prescribe antifungal medicine. For some cases of chronic sinusitis, surgery is needed. Generally, these are cases in which sinusitis recurs more than 3 times per year, despite other treatments. HOME CARE INSTRUCTIONS   Drink plenty of water. Water helps thin the mucus so your sinuses can drain more easily.  Use a humidifier.  Inhale steam 3 to 4 times a day (for example, sit in the bathroom with the shower running).  Apply a warm, moist washcloth to your face 3 to 4 times a day, or as directed by your health care provider.  Use saline nasal sprays to help moisten and clean your sinuses.  Take medicines only as directed  by your health care provider.  If you were prescribed either an antibiotic or antifungal medicine, finish it all even if you start to feel better. SEEK IMMEDIATE MEDICAL CARE IF:  You have increasing pain or severe headaches.  You have nausea, vomiting, or drowsiness.  You have swelling around your face.  You have vision  problems.  You have a stiff neck.  You have difficulty breathing. MAKE SURE YOU:   Understand these instructions.  Will watch your condition.  Will get help right away if you are not doing well or get worse. Document Released: 07/09/2005 Document Revised: 11/23/2013 Document Reviewed: 07/24/2011 [facility] Patient Information 2015 [place], [place]. This information is not intended to replace advice given to you by your health care provider. Make sure you discuss any questions you have with your health care provider.

## 2014-07-23 ENCOUNTER — Other Ambulatory Visit: Payer: Self-pay | Admitting: Internal Medicine

## 2014-07-26 ENCOUNTER — Other Ambulatory Visit (INDEPENDENT_AMBULATORY_CARE_PROVIDER_SITE_OTHER): Payer: BLUE CROSS/BLUE SHIELD

## 2014-07-26 DIAGNOSIS — Z0000 Encounter for general adult medical examination without abnormal findings: Secondary | ICD-10-CM

## 2014-07-26 LAB — HEPATIC FUNCTION PANEL
ALK PHOS: 81 U/L (ref 39–117)
ALT: 19 U/L (ref 0–53)
AST: 20 U/L (ref 0–37)
Albumin: 3.8 g/dL (ref 3.5–5.2)
BILIRUBIN TOTAL: 0.9 mg/dL (ref 0.2–1.2)
Bilirubin, Direct: 0.1 mg/dL (ref 0.0–0.3)
Total Protein: 6.5 g/dL (ref 6.0–8.3)

## 2014-07-26 LAB — LIPID PANEL
CHOLESTEROL: 210 mg/dL — AB (ref 0–200)
HDL: 33 mg/dL — ABNORMAL LOW (ref >39.00)
LDL CALC: 145 mg/dL — AB (ref 0–99)
NonHDL: 177
TRIGLYCERIDES: 162 mg/dL — AB (ref 0.0–149.0)
Total CHOL/HDL Ratio: 6
VLDL: 32.4 mg/dL (ref 0.0–40.0)

## 2014-07-26 LAB — POCT URINALYSIS DIPSTICK
BILIRUBIN UA: NEGATIVE
Glucose, UA: NEGATIVE
KETONES UA: NEGATIVE
LEUKOCYTES UA: NEGATIVE
Nitrite, UA: NEGATIVE
Spec Grav, UA: 1.015
UROBILINOGEN UA: 0.2
pH, UA: 5

## 2014-07-26 LAB — CBC WITH DIFFERENTIAL/PLATELET
BASOS PCT: 0.5 % (ref 0.0–3.0)
Basophils Absolute: 0.1 10*3/uL (ref 0.0–0.1)
EOS PCT: 4.9 % (ref 0.0–5.0)
Eosinophils Absolute: 0.6 10*3/uL (ref 0.0–0.7)
HEMATOCRIT: 38 % — AB (ref 39.0–52.0)
Hemoglobin: 12.1 g/dL — ABNORMAL LOW (ref 13.0–17.0)
LYMPHS ABS: 3.6 10*3/uL (ref 0.7–4.0)
Lymphocytes Relative: 31 % (ref 12.0–46.0)
MCHC: 31.9 g/dL (ref 30.0–36.0)
MCV: 75.9 fl — ABNORMAL LOW (ref 78.0–100.0)
MONOS PCT: 6.9 % (ref 3.0–12.0)
Monocytes Absolute: 0.8 10*3/uL (ref 0.1–1.0)
NEUTROS ABS: 6.6 10*3/uL (ref 1.4–7.7)
Neutrophils Relative %: 56.7 % (ref 43.0–77.0)
PLATELETS: 261 10*3/uL (ref 150.0–400.0)
RBC: 5 Mil/uL (ref 4.22–5.81)
RDW: 17.8 % — ABNORMAL HIGH (ref 11.5–15.5)
WBC: 11.6 10*3/uL — AB (ref 4.0–10.5)

## 2014-07-26 LAB — BASIC METABOLIC PANEL
BUN: 36 mg/dL — ABNORMAL HIGH (ref 6–23)
CALCIUM: 10.1 mg/dL (ref 8.4–10.5)
CO2: 21 meq/L (ref 19–32)
CREATININE: 1.9 mg/dL — AB (ref 0.4–1.5)
Chloride: 112 mEq/L (ref 96–112)
GFR: 37.84 mL/min — ABNORMAL LOW (ref >60.00)
GLUCOSE: 91 mg/dL (ref 70–99)
POTASSIUM: 5.9 meq/L — AB (ref 3.5–5.1)
SODIUM: 141 meq/L (ref 135–145)

## 2014-07-26 LAB — TSH: TSH: 2.28 u[IU]/mL (ref 0.35–4.50)

## 2014-07-26 LAB — PSA: PSA: 0.81 ng/mL (ref 0.10–4.00)

## 2014-08-02 ENCOUNTER — Encounter: Payer: Self-pay | Admitting: Internal Medicine

## 2014-08-02 ENCOUNTER — Other Ambulatory Visit: Payer: Self-pay | Admitting: *Deleted

## 2014-08-02 ENCOUNTER — Ambulatory Visit (INDEPENDENT_AMBULATORY_CARE_PROVIDER_SITE_OTHER): Payer: BLUE CROSS/BLUE SHIELD | Admitting: Internal Medicine

## 2014-08-02 ENCOUNTER — Ambulatory Visit (INDEPENDENT_AMBULATORY_CARE_PROVIDER_SITE_OTHER): Payer: BLUE CROSS/BLUE SHIELD | Admitting: *Deleted

## 2014-08-02 VITALS — BP 140/80 | HR 93 | Temp 98.1°F | Resp 20 | Ht 71.5 in | Wt 341.0 lb

## 2014-08-02 DIAGNOSIS — E669 Obesity, unspecified: Secondary | ICD-10-CM

## 2014-08-02 DIAGNOSIS — G4733 Obstructive sleep apnea (adult) (pediatric): Secondary | ICD-10-CM

## 2014-08-02 DIAGNOSIS — N183 Chronic kidney disease, stage 3 unspecified: Secondary | ICD-10-CM

## 2014-08-02 DIAGNOSIS — M150 Primary generalized (osteo)arthritis: Secondary | ICD-10-CM

## 2014-08-02 DIAGNOSIS — M159 Polyosteoarthritis, unspecified: Secondary | ICD-10-CM

## 2014-08-02 DIAGNOSIS — Z23 Encounter for immunization: Secondary | ICD-10-CM

## 2014-08-02 DIAGNOSIS — I10 Essential (primary) hypertension: Secondary | ICD-10-CM

## 2014-08-02 DIAGNOSIS — Z86010 Personal history of colonic polyps: Secondary | ICD-10-CM

## 2014-08-02 DIAGNOSIS — Z0000 Encounter for general adult medical examination without abnormal findings: Secondary | ICD-10-CM

## 2014-08-02 MED ORDER — TRAMADOL HCL 50 MG PO TABS: 50.0000 mg | ORAL_TABLET | Freq: Four times a day (QID) | ORAL | Status: DC | PRN

## 2014-08-02 NOTE — Patient Instructions (Addendum)
Limit your sodium (Salt) intake    It is important that you exercise regularly, at least 20 minutes 3 to 4 times per week.  If you develop chest pain or shortness of breath seek  medical attention.  You need to lose weight.  Consider a lower calorie diet and regular exercise.  Please see your eye doctor yearly  Return in 6 months for follow-up Health Maintenance A healthy lifestyle and preventative care can promote health and wellness.  Maintain regular health, dental, and eye exams.  Eat a healthy diet. Foods like vegetables, fruits, whole grains, low-fat dairy products, and lean protein foods contain the nutrients you need and are low in calories. Decrease your intake of foods high in solid fats, added sugars, and salt. Get information about a proper diet from your health care provider, if necessary.  Regular physical exercise is one of the most important things you can do for your health. Most adults should get at least 150 minutes of moderate-intensity exercise (any activity that increases your heart rate and causes you to sweat) each week. In addition, most adults need muscle-strengthening exercises on 2 or more days a week.   Maintain a healthy weight. The body mass index (BMI) is a screening tool to identify possible weight problems. It provides an estimate of body fat based on height and weight. Your health care provider can find your BMI and can help you achieve or maintain a healthy weight. For males 20 years and older:  A BMI below 18.5 is considered underweight.  A BMI of 18.5 to 24.9 is normal.  A BMI of 25 to 29.9 is considered overweight.  A BMI of 30 and above is considered obese.  Maintain normal blood lipids and cholesterol by exercising and minimizing your intake of saturated fat. Eat a balanced diet with plenty of fruits and vegetables. Blood tests for lipids and cholesterol should begin at age 27 and be repeated every 5 years. If your lipid or cholesterol levels are  high, you are over age 43, or you are at high risk for heart disease, you may need your cholesterol levels checked more frequently.Ongoing high lipid and cholesterol levels should be treated with medicines if diet and exercise are not working.  If you smoke, find out from your health care provider how to quit. If you do not use tobacco, do not start.  Lung cancer screening is recommended for adults aged 68-80 years who are at high risk for developing lung cancer because of a history of smoking. A yearly low-dose CT scan of the lungs is recommended for people who have at least a 30-pack-year history of smoking and are current smokers or have quit within the past 15 years. A pack year of smoking is smoking an average of 1 pack of cigarettes a day for 1 year (for example, a 30-pack-year history of smoking could mean smoking 1 pack a day for 30 years or 2 packs a day for 15 years). Yearly screening should continue until the smoker has stopped smoking for at least 15 years. Yearly screening should be stopped for people who develop a health problem that would prevent them from having lung cancer treatment.  If you choose to drink alcohol, do not have more than 2 drinks per day. One drink is considered to be 12 oz (360 mL) of beer, 5 oz (150 mL) of wine, or 1.5 oz (45 mL) of liquor.  Avoid the use of street drugs. Do not share needles with anyone.  Ask for help if you need support or instructions about stopping the use of drugs.  High blood pressure causes heart disease and increases the risk of stroke. Blood pressure should be checked at least every 1-2 years. Ongoing high blood pressure should be treated with medicines if weight loss and exercise are not effective.  If you are 73-25 years old, ask your health care provider if you should take aspirin to prevent heart disease.  Diabetes screening involves taking a blood sample to check your fasting blood sugar level. This should be done once every 3 years  after age 67 if you are at a normal weight and without risk factors for diabetes. Testing should be considered at a younger age or be carried out more frequently if you are overweight and have at least 1 risk factor for diabetes.  Colorectal cancer can be detected and often prevented. Most routine colorectal cancer screening begins at the age of 56 and continues through age 46. However, your health care provider may recommend screening at an earlier age if you have risk factors for colon cancer. On a yearly basis, your health care provider may provide home test kits to check for hidden blood in the stool. A small camera at the end of a tube may be used to directly examine the colon (sigmoidoscopy or colonoscopy) to detect the earliest forms of colorectal cancer. Talk to your health care provider about this at age 65 when routine screening begins. A direct exam of the colon should be repeated every 5-10 years through age 22, unless early forms of precancerous polyps or small growths are found.  People who are at an increased risk for hepatitis B should be screened for this virus. You are considered at high risk for hepatitis B if:  You were born in a country where hepatitis B occurs often. Talk with your health care provider about which countries are considered high risk.  Your parents were born in a high-risk country and you have not received a shot to protect against hepatitis B (hepatitis B vaccine).  You have HIV or AIDS.  You use needles to inject street drugs.  You live with, or have sex with, someone who has hepatitis B.  You are a man who has sex with other men (MSM).  You get hemodialysis treatment.  You take certain medicines for conditions like cancer, organ transplantation, and autoimmune conditions.  Hepatitis C blood testing is recommended for all people born from [birth year] through 1966 and any individual with known risk factors for hepatitis C.  Healthy men should no longer receive  prostate-specific antigen (PSA) blood tests as part of routine cancer screening. Talk to your health care provider about prostate cancer screening.  Testicular cancer screening is not recommended for adolescents or adult males who have no symptoms. Screening includes self-exam, a health care provider exam, and other screening tests. Consult with your health care provider about any symptoms you have or any concerns you have about testicular cancer.  Practice safe sex. Use condoms and avoid high-risk sexual practices to reduce the spread of sexually transmitted infections (STIs).  You should be screened for STIs, including gonorrhea and chlamydia if:  You are sexually active and are younger than 24 years.  You are older than 24 years, and your health care provider tells you that you are at risk for this type of infection.  Your sexual activity has changed since you were last screened, and you are at an increased risk for chlamydia  or gonorrhea. Ask your health care provider if you are at risk.  If you are at risk of being infected with HIV, it is recommended that you take a prescription medicine daily to prevent HIV infection. This is called pre-exposure prophylaxis (PrEP). You are considered at risk if:  You are a man who has sex with other men (MSM).  You are a heterosexual man who is sexually active with multiple partners.  You take drugs by injection.  You are sexually active with a partner who has HIV.  Talk with your health care provider about whether you are at high risk of being infected with HIV. If you choose to begin PrEP, you should first be tested for HIV. You should then be tested every 3 months for as long as you are taking PrEP.  Use sunscreen. Apply sunscreen liberally and repeatedly throughout the day. You should seek shade when your shadow is shorter than you. Protect yourself by wearing long sleeves, pants, a wide-brimmed hat, and sunglasses year round whenever you are  outdoors.  Tell your health care provider of new moles or changes in moles, especially if there is a change in shape or color. Also, tell your health care provider if a mole is larger than the size of a pencil eraser.  A one-time screening for abdominal aortic aneurysm (AAA) and surgical repair of large AAAs by ultrasound is recommended for men aged 84-75 years who are current or former smokers.  Stay current with your vaccines (immunizations). Document Released: 01/05/2008 Document Revised: 07/14/2013 Document Reviewed: 12/04/2010 [facility] Patient Information 2015 [place], [place]. This information is not intended to replace advice given to you by your health care provider. Make sure you discuss any questions you have with your health care provider.

## 2014-08-02 NOTE — Progress Notes (Signed)
Pre visit review using our clinic review tool, if applicable. No additional management support is needed unless otherwise documented below in the visit note. 

## 2014-08-02 NOTE — Progress Notes (Signed)
Subjective:    Patient ID: Alex Williams, male    DOB: [birth year], 64 y.o.   MRN: [id number]  HPI  64 year old patient who is seen today for a wellness exam.  Last colonoscopy 2012  Doing quite well.  Occasional right knee and left hip pain.  Weight is down modestly 10 pounds.  Past Medical History  Diagnosis Date  . COLONIC POLYPS, HX OF 06/27/2007  . EXOGENOUS OBESITY 01/30/2010  . GOUT 01/30/2010  . HYPERCHOLESTEROLEMIA 06/30/2007  . HYPERTENSION 06/27/2007  . LOW BACK PAIN 06/27/2007  . NEPHROLITHIASIS, HX OF 06/27/2007  . OSTEOARTHRITIS 06/27/2007  . SLEEP APNEA, OBSTRUCTIVE, MODERATE 01/27/2009  . Cervical disc disease     History   Social History  . Marital Status: Married    Spouse Name: N/A    Number of Children: N/A  . Years of Education: N/A   Occupational History  . Not on file.   Social History Main Topics  . Smoking status: Former Smoker    Quit date: 07/23/1980  . Smokeless tobacco: Never Used  . Alcohol Use: Yes  . Drug Use: No  . Sexual Activity: Not on file   Other Topics Concern  . Not on file   Social History Narrative    Past Surgical History  Procedure Laterality Date  . Lumbar laminectomy    . Total knee arthroplasty      left  . Cardiac catheterization    . Radiofrequency ablation kidney      Family History  Problem Relation Age of Onset  . Diabetes Neg Hx     family sisters x6 hx of  . Hypertension Other   . Sleep apnea Other     Allergies  Allergen Reactions  . Codeine Phosphate     Current Outpatient Prescriptions on File Prior to Visit  Medication Sig Dispense Refill  . amLODipine (NORVASC) 10 MG tablet TAKE 1 TABLET BY MOUTH EVERY DAY 90 tablet 1  . aspirin 81 MG tablet Take 81 mg by mouth daily.      . benazepril (LOTENSIN) 40 MG tablet TAKE 1 TABLET BY MOUTH EVERY DAY 90 tablet 3  . cetirizine (ZYRTEC) 10 MG tablet Take 10 mg by mouth daily.    . chlorpheniramine-HYDROcodone (TUSSIONEX PENNKINETIC ER) 10-8 MG/5ML  LQCR Take 5 mLs by mouth at bedtime as needed for cough. 120 mL 0  . Fish Oil-Cholecalciferol (OMEGA-3 FISH OIL/VITAMIN D3 PO) Take by mouth daily.      . indomethacin (INDOCIN) 50 MG capsule TAKE ONE CAPSULE 3 TIMES A DAY WITH MEALS AS NEEDED    . Multiple Vitamins-Minerals (ONE-A-DAY MENS 50+ ADVANTAGE PO) Take 1 tablet by mouth daily.    . naproxen sodium (ANAPROX DS) 550 MG tablet Take 1 tablet (550 mg total) by mouth 2 (two) times daily with a meal. 40 tablet 0  . pantoprazole (PROTONIX) 40 MG tablet TAKE 1 TABLET EVERY DAY 90 tablet 3  . rosuvastatin (CRESTOR) 5 MG tablet TAKE 1 TABLET BY MOUTH EVERY OTHER DAY    . traMADol (ULTRAM) 50 MG tablet TAKE 1 TABLET BY MOUTH EVERY 6 HOURS AS NEEDED FOR PAIN 90 tablet 2  . vitamin C (ASCORBIC ACID) 500 MG tablet Take 500 mg by mouth daily.      No current facility-administered medications on file prior to visit.    BP 140/80 mmHg  Pulse 93  Temp(Src) 98.1 F (36.7 C) (Oral)  Resp 20  Ht 5' 11.5" (1.816 m)  Wt 341  lb (154.677 kg)  BMI 46.90 kg/m2  SpO2 99%     Review of Systems  Constitutional: Negative for fever, chills, activity change, appetite change and fatigue.  HENT: Negative for congestion, dental problem, ear pain, hearing loss, mouth sores, rhinorrhea, sinus pressure, sneezing, tinnitus, trouble swallowing and voice change.   Eyes: Negative for photophobia, pain, redness and visual disturbance.  Respiratory: Negative for apnea, cough, choking, chest tightness, shortness of breath and wheezing.   Cardiovascular: Negative for chest pain, palpitations and leg swelling.  Gastrointestinal: Negative for nausea, vomiting, abdominal pain, diarrhea, constipation, blood in stool, abdominal distention, anal bleeding and rectal pain.  Genitourinary: Negative for dysuria, urgency, frequency, hematuria, flank pain, decreased urine volume, discharge, penile swelling, scrotal swelling, difficulty urinating, genital sores and testicular pain.    Musculoskeletal: Negative for myalgias, back pain, joint swelling, arthralgias, gait problem, neck pain and neck stiffness.       Right knee and left hip discomfort  Skin: Negative for color change, rash and wound.  Neurological: Negative for dizziness, tremors, seizures, syncope, facial asymmetry, speech difficulty, weakness, light-headedness, numbness and headaches.  Hematological: Negative for adenopathy. Does not bruise/bleed easily.  Psychiatric/Behavioral: Negative for suicidal ideas, hallucinations, behavioral problems, confusion, sleep disturbance, self-injury, dysphoric mood, decreased concentration and agitation. The patient is not nervous/anxious.        Objective:   Physical Exam  Constitutional: He appears well-developed and well-nourished.  HENT:  Head: Normocephalic and atraumatic.  Right Ear: External ear normal.  Left Ear: External ear normal.  Nose: Nose normal.  Mouth/Throat: Oropharynx is clear and moist.  Eyes: Conjunctivae and EOM are normal. Pupils are equal, round, and reactive to light. No scleral icterus.  Neck: Normal range of motion. Neck supple. No JVD present. No thyromegaly present.  Cardiovascular: Regular rhythm, normal heart sounds and intact distal pulses.  Exam reveals no gallop and no friction rub.   No murmur heard. Diminished left dorsalis pedis pulse  Pulmonary/Chest: Effort normal and breath sounds normal. He exhibits no tenderness.  Abdominal: Soft. Bowel sounds are normal. He exhibits no distension and no mass. There is no tenderness.  Genitourinary: Penis normal.  Uncircumcised Declines prostate exam  Musculoskeletal: Normal range of motion. He exhibits edema. He exhibits no tenderness.  Right knee brace  Lower extremity edema  Lymphadenopathy:    He has no cervical adenopathy.  Neurological: He is alert. He has normal reflexes. No cranial nerve deficit. Coordination normal.  Skin: Skin is warm and dry. No rash noted.  Psychiatric: He  has a normal mood and affect. His behavior is normal.          Assessment & Plan:   Preventive health examination.  Annual eye examination recommended ongoing weight loss encouraged Morbid obesity Dyslipidemia.  Continue Crestor History of gout.  Stable OSA.  Continue CPAP Osteoarthritis Chronic kidney disease.  Will minimize anti-inflammatory drugs  Hypertension, well-controlled  Recheck 6 months

## 2014-08-03 ENCOUNTER — Telehealth: Payer: Self-pay | Admitting: Internal Medicine

## 2014-08-03 NOTE — Telephone Encounter (Signed)
emmi mailed  °

## 2014-08-05 ENCOUNTER — Other Ambulatory Visit: Payer: Self-pay | Admitting: Internal Medicine

## 2014-11-01 ENCOUNTER — Other Ambulatory Visit: Payer: Self-pay | Admitting: Internal Medicine

## 2015-01-18 ENCOUNTER — Other Ambulatory Visit: Payer: Self-pay | Admitting: Internal Medicine

## 2015-01-26 ENCOUNTER — Other Ambulatory Visit: Payer: Self-pay | Admitting: Internal Medicine

## 2015-02-01 ENCOUNTER — Ambulatory Visit: Payer: Self-pay | Admitting: Internal Medicine

## 2015-02-21 DIAGNOSIS — J029 Acute pharyngitis, unspecified: Secondary | ICD-10-CM | POA: Diagnosis not present

## 2015-02-21 DIAGNOSIS — J0101 Acute recurrent maxillary sinusitis: Secondary | ICD-10-CM | POA: Diagnosis not present

## 2015-02-21 DIAGNOSIS — H60391 Other infective otitis externa, right ear: Secondary | ICD-10-CM | POA: Diagnosis not present

## 2015-02-21 DIAGNOSIS — J209 Acute bronchitis, unspecified: Secondary | ICD-10-CM | POA: Diagnosis not present

## 2015-03-16 DIAGNOSIS — J029 Acute pharyngitis, unspecified: Secondary | ICD-10-CM | POA: Diagnosis not present

## 2015-03-17 DIAGNOSIS — J029 Acute pharyngitis, unspecified: Secondary | ICD-10-CM | POA: Diagnosis not present

## 2015-05-05 ENCOUNTER — Ambulatory Visit (HOSPITAL_COMMUNITY)
Admission: RE | Admit: 2015-05-05 | Discharge: 2015-05-05 | Disposition: A | Discharge status: Home / Self Care | Payer: Medicare Other | Source: Ambulatory Visit | Attending: Cardiovascular Disease | Admitting: Cardiovascular Disease

## 2015-05-05 ENCOUNTER — Ambulatory Visit (INDEPENDENT_AMBULATORY_CARE_PROVIDER_SITE_OTHER): Payer: Medicare Other | Admitting: Physician Assistant

## 2015-05-05 VITALS — BP 110/80 | HR 108 | Temp 98.0°F | Resp 20 | Ht 71.5 in | Wt 326.0 lb

## 2015-05-05 DIAGNOSIS — Z8639 Personal history of other endocrine, nutritional and metabolic disease: Secondary | ICD-10-CM | POA: Diagnosis not present

## 2015-05-05 DIAGNOSIS — M79672 Pain in left foot: Secondary | ICD-10-CM

## 2015-05-05 DIAGNOSIS — Z8739 Personal history of other diseases of the musculoskeletal system and connective tissue: Secondary | ICD-10-CM

## 2015-05-05 DIAGNOSIS — R600 Localized edema: Secondary | ICD-10-CM | POA: Insufficient documentation

## 2015-05-05 DIAGNOSIS — E785 Hyperlipidemia, unspecified: Secondary | ICD-10-CM | POA: Insufficient documentation

## 2015-05-05 DIAGNOSIS — R000 Tachycardia, unspecified: Secondary | ICD-10-CM | POA: Diagnosis not present

## 2015-05-05 DIAGNOSIS — I10 Essential (primary) hypertension: Secondary | ICD-10-CM | POA: Diagnosis not present

## 2015-05-05 LAB — POCT CBC
Granulocyte percent: 63.4 %G (ref 37–80)
HCT, POC: 34 % — AB (ref 43.5–53.7)
Hemoglobin: 11.2 g/dL — AB (ref 14.1–18.1)
Lymph, poc: 3.4 (ref 0.6–3.4)
MCH, POC: 24.7 pg — AB (ref 27–31.2)
MCHC: 33 g/dL (ref 31.8–35.4)
MCV: 74.8 fL — AB (ref 80–97)
MID (cbc): 0.8 (ref 0–0.9)
MPV: 6.6 fL (ref 0–99.8)
PLATELET COUNT, POC: 363 10*3/uL (ref 142–424)
POC Granulocyte: 7.4 — AB (ref 2–6.9)
POC LYMPH %: 29.7 % (ref 10–50)
POC MID %: 6.9 %M (ref 0–12)
RBC: 4.54 M/uL — AB (ref 4.69–6.13)
RDW, POC: 15.3 %
WBC: 11.6 10*3/uL — AB (ref 4.6–10.2)

## 2015-05-05 LAB — HEMOGLOBIN A1C: Hgb A1c MFr Bld: 5.8 % (ref 4.0–6.0)

## 2015-05-05 LAB — URIC ACID: URIC ACID, SERUM: 9.1 mg/dL — AB (ref 4.0–7.8)

## 2015-05-05 LAB — POCT GLYCOSYLATED HEMOGLOBIN (HGB A1C): Hemoglobin A1C: 5.8

## 2015-05-05 MED ORDER — PREDNISONE 20 MG PO TABS: 40.0000 mg | ORAL_TABLET | Freq: Every day | ORAL | Status: DC

## 2015-05-05 NOTE — Progress Notes (Signed)
05/05/2015 at 4:34 PM  Alex Williams / DOB: Aug 07, 1950 / MRN: [id number]  The patient has HYPERCHOLESTEROLEMIA; GOUT; Obesity; Obstructive sleep apnea; Essential hypertension; Osteoarthritis; LOW BACK PAIN; History of colonic polyps; NEPHROLITHIASIS, HX OF; and Chronic kidney disease on his problem list.  SUBJECTIVE  Alex Williams is a 64 y.o. male who complains of left leg pain and swelling that started 3 days ago after stepping on a rock.  Denies any injury at that time but since has had significant pain in the top of his foot and difficulty with ambulation.  States that at time the pain will "shoot up" the back of his leg.  Denies chest pain, SOB at this time.  He has a history of gout.  He does not take blood thinners.  No history of diabetes. He has been taking indomethacin with modest relief.    He  has a past medical history of COLONIC POLYPS, HX OF (06/27/2007); EXOGENOUS OBESITY (01/30/2010); GOUT (01/30/2010); HYPERCHOLESTEROLEMIA (06/30/2007); HYPERTENSION (06/27/2007); LOW BACK PAIN (06/27/2007); NEPHROLITHIASIS, HX OF (06/27/2007); OSTEOARTHRITIS (06/27/2007); SLEEP APNEA, OBSTRUCTIVE, MODERATE (01/27/2009); and Cervical disc disease.    Medications reviewed and updated by myself where necessary, and exist elsewhere in the encounter.   Mr. Mullaly is allergic to codeine phosphate. He  reports that he quit smoking about 34 years ago. He has never used smokeless tobacco. He reports that he drinks alcohol. He reports that he does not use illicit drugs. He  has no sexual activity history on file. The patient  has past surgical history that includes Lumbar laminectomy; Total knee arthroplasty; Cardiac catheterization; and Radiofrequency ablation kidney.  His family history includes Hypertension in his other; Sleep apnea in his other. There is no history of Diabetes.  Review of Systems  Constitutional: Negative for fever and chills.  Respiratory: Negative for shortness of breath.   Cardiovascular:  Negative for chest pain.  Gastrointestinal: Negative for nausea and abdominal pain.  Genitourinary: Negative.   Musculoskeletal: Positive for joint pain.  Skin: Negative for rash.  Neurological: Negative for dizziness and headaches.    OBJECTIVE  His  height is 5' 11.5" (1.816 m) and weight is 326 lb (147.873 kg). His other (comment) temperature is 98 F (36.7 C). His blood pressure is 110/80 and his pulse is 108. His respiration is 20 and oxygen saturation is 98%.  The patient's body mass index is 44.84 kg/(m^2).  Physical Exam  Constitutional: He is oriented to person, place, and time.  Cardiovascular: Normal heart sounds and intact distal pulses.   No extrasystoles are present. Tachycardia present.  Exam reveals no gallop, no friction rub and no decreased pulses.   No murmur heard. Respiratory: Effort normal and breath sounds normal. He has no wheezes. He has no rales.  GI: Soft.  Neurological: He is alert and oriented to person, place, and time.  Skin: Skin is warm and dry.    Results for orders placed or performed in visit on 05/05/15 (from the past 24 hour(s))  POCT CBC     Status: Abnormal   Collection Time: 05/05/15  4:08 PM  Result Value Ref Range   WBC 11.6 (A) 4.6 - 10.2 K/uL   Lymph, poc 3.4 0.6 - 3.4   POC LYMPH PERCENT 29.7 10 - 50 %L   MID (cbc) 0.8 0 - 0.9   POC MID % 6.9 0 - 12 %M   POC Granulocyte 7.4 (A) 2 - 6.9   Granulocyte percent 63.4 37 - 80 %G  RBC 4.54 (A) 4.69 - 6.13 M/uL   Hemoglobin 11.2 (A) 14.1 - 18.1 g/dL   HCT, POC 34.0 (A) 43.5 - 53.7 %   MCV 74.8 (A) 80 - 97 fL   MCH, POC 24.7 (A) 27 - 31.2 pg   MCHC 33.0 31.8 - 35.4 g/dL   RDW, POC 15.3 %   Platelet Count, POC 363 142 - 424 K/uL   MPV 6.6 0 - 99.8 fL  POCT glycosylated hemoglobin (Hb A1C)     Status: None   Collection Time: 05/05/15  4:16 PM  Result Value Ref Range   Hemoglobin A1C 5.8     ASSESSMENT & PLAN  Aswad was seen today for foot pain.  Diagnoses and all orders for this  visit:  Leg edema, left: -     Cancel: Ultrasound doppler venous legs bilat; Future -     POCT CBC -     Uric Acid -     VAS [place] LOWER EXTREMITY VENOUS (DVT); Future  Foot pain, left: Most likely secondary to problem 3.  However, given the swelling and distal posterior leg pain I am concerned for the possibility of blood clot.  Will rule out with venous doppler.  If positive will hold prednisone and start Xarelto.   -     Cancel: Ultrasound doppler venous legs bilat; Future -     POCT CBC -     Uric Acid -     VAS [place] LOWER EXTREMITY VENOUS (DVT); Future  History of gout -     POCT glycosylated hemoglobin (Hb A1C)  Tachycardia: Possibly secondary to pain. Rule out DVT.     The patient was advised to call or come back to clinic if he does not see an improvement in symptoms, or worsens with the above plan.   Philis Fendt, MHS, PA-C [facility] 05/05/2015 4:34 PM   05/05/2015 5:46 PM: Doppler negative for DVT.  Patient advised to go to pharmacy and fill prednisone. RTC if

## 2015-05-05 NOTE — Patient Instructions (Signed)
Please go directly to [facility] at [place].

## 2015-05-11 NOTE — Progress Notes (Signed)
  Medical screening examination/treatment/procedure(s) were performed by non-physician practitioner and as supervising physician I was immediately available for consultation/collaboration.     

## 2015-05-20 ENCOUNTER — Encounter: Payer: Self-pay | Admitting: Family Medicine

## 2015-05-24 ENCOUNTER — Ambulatory Visit (INDEPENDENT_AMBULATORY_CARE_PROVIDER_SITE_OTHER): Payer: Medicare Other | Admitting: Family Medicine

## 2015-05-24 ENCOUNTER — Ambulatory Visit (INDEPENDENT_AMBULATORY_CARE_PROVIDER_SITE_OTHER): Payer: Medicare Other

## 2015-05-24 VITALS — BP 158/76 | HR 100 | Temp 98.1°F | Resp 20 | Ht 71.0 in | Wt 324.4 lb

## 2015-05-24 DIAGNOSIS — Z8639 Personal history of other endocrine, nutritional and metabolic disease: Secondary | ICD-10-CM

## 2015-05-24 DIAGNOSIS — N183 Chronic kidney disease, stage 3 (moderate): Secondary | ICD-10-CM | POA: Diagnosis not present

## 2015-05-24 DIAGNOSIS — M25572 Pain in left ankle and joints of left foot: Secondary | ICD-10-CM | POA: Diagnosis not present

## 2015-05-24 DIAGNOSIS — Z8739 Personal history of other diseases of the musculoskeletal system and connective tissue: Secondary | ICD-10-CM

## 2015-05-24 MED ORDER — PREDNISONE 20 MG PO TABS: 40.0000 mg | ORAL_TABLET | Freq: Every day | ORAL | Status: DC

## 2015-05-24 MED ORDER — HYDROCODONE-ACETAMINOPHEN 5-325 MG PO TABS: 1.0000 | ORAL_TABLET | Freq: Four times a day (QID) | ORAL | Status: DC | PRN

## 2015-05-24 NOTE — Patient Instructions (Signed)
Start prednisone for suspected gout flare again.  Avoid indomethacin and other NSAIDs for now until we see what your kidney function is. When last examined, it was at stage III for chronic kidney disease. Would avoid NSAIDs for now. This can be discussed further with your primary care provider.  Get a follow-up with your primary provider to discuss if other medicines needed for gout, but for now will treat just with prednisone for acute flair,  hydrocodone if needed for pain in the meantime. Return to the clinic or go to the nearest emergency room if any of your symptoms worsen or new symptoms occur. You should receive a call or letter about your lab results within the next week to 10 days.   Gout Gout is an inflammatory arthritis caused by a buildup of uric acid crystals in the joints. Uric acid is a chemical that is normally present in the blood. When the level of uric acid in the blood is too high it can form crystals that deposit in your joints and tissues. This causes joint redness, soreness, and swelling (inflammation). Repeat attacks are common. Over time, uric acid crystals can form into masses (tophi) near a joint, destroying bone and causing disfigurement. Gout is treatable and often preventable. CAUSES  The disease begins with elevated levels of uric acid in the blood. Uric acid is produced by your body when it breaks down a naturally found substance called purines. Certain foods you eat, such as meats and fish, contain high amounts of purines. Causes of an elevated uric acid level include:  Being passed down from parent to child (heredity).  Diseases that cause increased uric acid production (such as obesity, psoriasis, and certain cancers).  Excessive alcohol use.  Diet, especially diets rich in meat and seafood.  Medicines, including certain cancer-fighting medicines (chemotherapy), water pills (diuretics), and aspirin.  Chronic kidney disease. The kidneys are no longer able to  remove uric acid well.  Problems with metabolism. Conditions strongly associated with gout include:  Obesity.  High blood pressure.  High cholesterol.  Diabetes. Not everyone with elevated uric acid levels gets gout. It is not understood why some people get gout and others do not. Surgery, joint injury, and eating too much of certain foods are some of the factors that can lead to gout attacks. SYMPTOMS   An attack of gout comes on quickly. It causes intense pain with redness, swelling, and warmth in a joint.  Fever can occur.  Often, only one joint is involved. Certain joints are more commonly involved:  Base of the big toe.  Knee.  Ankle.  Wrist.  Finger. Without treatment, an attack usually goes away in a few days to weeks. Between attacks, you usually will not have symptoms, which is different from many other forms of arthritis. DIAGNOSIS  Your caregiver will suspect gout based on your symptoms and exam. In some cases, tests may be recommended. The tests may include:  Blood tests.  Urine tests.  X-rays.  Joint fluid exam. This exam requires a needle to remove fluid from the joint (arthrocentesis). Using a microscope, gout is confirmed when uric acid crystals are seen in the joint fluid. TREATMENT  There are two phases to gout treatment: treating the sudden onset (acute) attack and preventing attacks (prophylaxis).  Treatment of an Acute Attack.  Medicines are used. These include anti-inflammatory medicines or steroid medicines.  An injection of steroid medicine into the affected joint is sometimes necessary.  The painful joint is rested. Movement  can worsen the arthritis.  You may use warm or cold treatments on painful joints, depending which works best for you.  Treatment to Prevent Attacks.  If you suffer from frequent gout attacks, your caregiver may advise preventive medicine. These medicines are started after the acute attack subsides. These medicines  either help your kidneys eliminate uric acid from your body or decrease your uric acid production. You may need to stay on these medicines for a very long time.  The early phase of treatment with preventive medicine can be associated with an increase in acute gout attacks. For this reason, during the first few months of treatment, your caregiver may also advise you to take medicines usually used for acute gout treatment. Be sure you understand your caregiver's directions. Your caregiver may make several adjustments to your medicine dose before these medicines are effective.  Discuss dietary treatment with your caregiver or dietitian. Alcohol and drinks high in sugar and fructose and foods such as meat, poultry, and seafood can increase uric acid levels. Your caregiver or dietitian can advise you on drinks and foods that should be limited. HOME CARE INSTRUCTIONS   Do not take aspirin to relieve pain. This raises uric acid levels.  Only take over-the-counter or prescription medicines for pain, discomfort, or fever as directed by your caregiver.  Rest the joint as much as possible. When in bed, keep sheets and blankets off painful areas.  Keep the affected joint raised (elevated).  Apply warm or cold treatments to painful joints. Use of warm or cold treatments depends on which works best for you.  Use crutches if the painful joint is in your leg.  Drink enough fluids to keep your urine clear or pale yellow. This helps your body get rid of uric acid. Limit alcohol, sugary drinks, and fructose drinks.  Follow your dietary instructions. Pay careful attention to the amount of protein you eat. Your daily diet should emphasize fruits, vegetables, whole grains, and fat-free or low-fat milk products. Discuss the use of coffee, vitamin C, and cherries with your caregiver or dietitian. These may be helpful in lowering uric acid levels.  Maintain a healthy body weight. SEEK MEDICAL CARE IF:   You develop  diarrhea, vomiting, or any side effects from medicines.  You do not feel better in 24 hours, or you are getting worse. SEEK IMMEDIATE MEDICAL CARE IF:   Your joint becomes suddenly more tender, and you have chills or a fever. MAKE SURE YOU:   Understand these instructions.  Will watch your condition.  Will get help right away if you are not doing well or get worse.   This information is not intended to replace advice given to you by your health care provider. Make sure you discuss any questions you have with your health care provider.   Document Released: 07/06/2000 Document Revised: 07/30/2014 Document Reviewed: 02/20/2012 Elsevier Interactive Patient Education [employer].

## 2015-05-24 NOTE — Progress Notes (Signed)
Subjective:  This chart was scribed for Merri Ray, MD by Moises Blood, Medical Scribe. This patient was seen in room 13 and the patient's care was started 4:16 PM.   Patient ID: Alex Williams, male    DOB: 04/26/1951, 64 y.o.   MRN: [id number]  HPI Alex Williams is a 64 y.o. male Here for recheck of left foot. He was most recently seen by Philis Fendt on Oct 13 for left leg pain and swelling. Started 3 days after stepping on a rock. Pain shot up back of his leg, treated for possible gout. He had a venous doppler on oct 13th. There was no evidence of left lower extremity deep or superficial venous thrombus. PCP is Nyoka Cowden, MD  Left ankle His left ankle still having issues. It resolved, but it came back. His left ankle was warm and red previously when seen by Philis Fendt on Oct 13th. He was on prednisone BID for 5 days and was improved. The pain came back yesterday, and he states that it doesn't feel like gout this time. He takes indomethacin for gout and took 25 mg yesterday. He denies taking colchicine before. The most recent gout flare up prior to Oct 13th was at least 6 months ago in his toes. He usually has night sweats when taking prednisone. He denies injury to the area.   Last uric acid with Philis Fendt was elevated. He had chicken, BBQ ribs and 3 pieces of shrimp few days ago. He denies consumption of seafood, aside from the 3 pieces of shrimp.   Chronic kidney disease His last GFR was 37.8. Stage 3b.   Patient Active Problem List   Diagnosis Date Noted  . Chronic kidney disease 12/10/2011  . GOUT 01/30/2010  . Obesity 01/30/2010  . Obstructive sleep apnea 01/27/2009  . HYPERCHOLESTEROLEMIA 06/30/2007  . Essential hypertension 06/27/2007  . Osteoarthritis 06/27/2007  . LOW BACK PAIN 06/27/2007  . History of colonic polyps 06/27/2007  . NEPHROLITHIASIS, HX OF 06/27/2007   Past Medical History  Diagnosis Date  . COLONIC POLYPS, HX OF 06/27/2007  .  EXOGENOUS OBESITY 01/30/2010  . GOUT 01/30/2010  . HYPERCHOLESTEROLEMIA 06/30/2007  . HYPERTENSION 06/27/2007  . LOW BACK PAIN 06/27/2007  . NEPHROLITHIASIS, HX OF 06/27/2007  . OSTEOARTHRITIS 06/27/2007  . SLEEP APNEA, OBSTRUCTIVE, MODERATE 01/27/2009  . Cervical disc disease    Past Surgical History  Procedure Laterality Date  . Lumbar laminectomy    . Total knee arthroplasty      left  . Cardiac catheterization    . Radiofrequency ablation kidney     Allergies  Allergen Reactions  . Codeine Phosphate    Prior to Admission medications   Medication Sig Start Date End Date Taking? Authorizing Provider  amLODipine (NORVASC) 10 MG tablet TAKE 1 TABLET BY MOUTH EVERY DAY 01/26/15  Yes Marletta Lor, MD  aspirin 81 MG tablet Take 81 mg by mouth daily.     Yes Historical Provider, MD  benazepril (LOTENSIN) 40 MG tablet TAKE 1 TABLET BY MOUTH ONCE DAILY 08/06/14  Yes Marletta Lor, MD  cetirizine (ZYRTEC) 10 MG tablet Take 10 mg by mouth daily.   Yes Historical Provider, MD  CRESTOR 5 MG tablet TAKE 1 TABLET EVERY DAY 01/18/15  Yes Marletta Lor, MD  Fish Oil-Cholecalciferol (OMEGA-3 FISH OIL/VITAMIN D3 PO) Take by mouth daily.     Yes Historical Provider, MD  indomethacin (INDOCIN) 50 MG capsule TAKE ONE CAPSULE 3 TIMES A DAY  WITH MEALS AS NEEDED   Yes Marletta Lor, MD  indomethacin (INDOCIN) 50 MG capsule TAKE ONE CAPSULE 3 TIMES A DAY WITH MEALS 11/02/14  Yes Marletta Lor, MD  Multiple Vitamins-Minerals (ONE-A-DAY MENS 50+ ADVANTAGE PO) Take 1 tablet by mouth daily.   Yes Historical Provider, MD  traMADol (ULTRAM) 50 MG tablet Take 1 tablet (50 mg total) by mouth every 6 (six) hours as needed. for pain 08/02/14  Yes Marletta Lor, MD  vitamin C (ASCORBIC ACID) 500 MG tablet Take 500 mg by mouth daily.    Yes Historical Provider, MD  predniSONE (DELTASONE) 20 MG tablet Take 2 tablets (40 mg total) by mouth daily with breakfast. Patient not taking: Reported on  05/24/2015 05/05/15   Tereasa Coop, PA-C   Social History   Social History  . Marital Status: Married    Spouse Name: N/A  . Number of Children: N/A  . Years of Education: N/A   Occupational History  . Not on file.   Social History Main Topics  . Smoking status: Former Smoker    Quit date: 07/23/1980  . Smokeless tobacco: Never Used  . Alcohol Use: Yes  . Drug Use: No  . Sexual Activity: Not on file   Other Topics Concern  . Not on file   Social History Narrative    Review of Systems  Cardiovascular: Positive for leg swelling (left ankle).  Musculoskeletal: Positive for joint swelling (left ankle), arthralgias (left ankle) and gait problem.  Skin: Negative for rash and wound.       Objective:   Physical Exam  Constitutional: He is oriented to person, place, and time. He appears well-developed and well-nourished. No distress.  HENT:  Head: Normocephalic and atraumatic.  Eyes: EOM are normal. Pupils are equal, round, and reactive to light.  Neck: Neck supple.  Cardiovascular: Normal rate.   Pulmonary/Chest: Effort normal. No respiratory distress.  Musculoskeletal: Normal range of motion.  Calf and knee non tender Left foot: Achilles non tender, foot non tender, 5th metatarsal medial malleolus nontender, soft tissue swelling and lateral malleolus, sensitive to light touch inferior to malleolus, slight warmth, no redness, nvi distally, guarded ROM  Neurological: He is alert and oriented to person, place, and time.  Skin: Skin is warm and dry.  Psychiatric: He has a normal mood and affect. His behavior is normal.  Nursing note and vitals reviewed.   Filed Vitals:   05/24/15 1528  BP: 158/76  Pulse: 100  Temp: 98.1 F (36.7 C)  TempSrc: Oral  Resp: 20  Height: 5\' 11"  (1.803 m)  Weight: 324 lb 6.4 oz (147.147 kg)  SpO2: 99%   [facility] reading (PRIMARY) by Dr. Carlota Raspberry : left ankle: small likely old calcification below lateral malleolus, no apparent acute fracture  or bony findings, appeared to have some midfoot degenerative changes and spurring of anterior tibia.      Assessment & Plan:   Alex Williams is a 64 y.o. male Left ankle pain - Plan: DG Ankle Complete Left, predniSONE (DELTASONE) 20 MG tablet, HYDROcodone-acetaminophen (NORCO/VICODIN) 5-325 MG tablet  History of gout - Plan: DG Ankle Complete Left, predniSONE (DELTASONE) 20 MG tablet, HYDROcodone-acetaminophen (NORCO/VICODIN) 5-325 MG tablet  CKD (chronic kidney disease), stage 3 (moderate) - Plan: Basic metabolic panel  Suspected repeat gout flare. underlying CKD- stage III prior - would avoid nsaids and colchicine for now until more recent creatinine eval. BMP pending.   -repeat treatment with prednisone - can try shorter course  initially as early in course.   -lortab rx given if needed for breakthrough pain - SED.   -rtc precautions.   Meds ordered this encounter  Medications  . predniSONE (DELTASONE) 20 MG tablet    Sig: Take 2 tablets (40 mg total) by mouth daily with breakfast.    Dispense:  6 tablet    Refill:  0  . HYDROcodone-acetaminophen (NORCO/VICODIN) 5-325 MG tablet    Sig: Take 1 tablet by mouth every 6 (six) hours as needed for moderate pain.    Dispense:  10 tablet    Refill:  0   Patient Instructions  Start prednisone for suspected gout flare again.  Avoid indomethacin and other NSAIDs for now until we see what your kidney function is. When last examined, it was at stage III for chronic kidney disease. Would avoid NSAIDs for now. This can be discussed further with your primary care provider.  Get a follow-up with your primary provider to discuss if other medicines needed for gout, but for now will treat just with prednisone for acute flair,  hydrocodone if needed for pain in the meantime. Return to the clinic or go to the nearest emergency room if any of your symptoms worsen or new symptoms occur. You should receive a call or letter about your lab results within the  next week to 10 days.   Gout Gout is an inflammatory arthritis caused by a buildup of uric acid crystals in the joints. Uric acid is a chemical that is normally present in the blood. When the level of uric acid in the blood is too high it can form crystals that deposit in your joints and tissues. This causes joint redness, soreness, and swelling (inflammation). Repeat attacks are common. Over time, uric acid crystals can form into masses (tophi) near a joint, destroying bone and causing disfigurement. Gout is treatable and often preventable. CAUSES  The disease begins with elevated levels of uric acid in the blood. Uric acid is produced by your body when it breaks down a naturally found substance called purines. Certain foods you eat, such as meats and fish, contain high amounts of purines. Causes of an elevated uric acid level include:  Being passed down from parent to child (heredity).  Diseases that cause increased uric acid production (such as obesity, psoriasis, and certain cancers).  Excessive alcohol use.  Diet, especially diets rich in meat and seafood.  Medicines, including certain cancer-fighting medicines (chemotherapy), water pills (diuretics), and aspirin.  Chronic kidney disease. The kidneys are no longer able to remove uric acid well.  Problems with metabolism. Conditions strongly associated with gout include:  Obesity.  High blood pressure.  High cholesterol.  Diabetes. Not everyone with elevated uric acid levels gets gout. It is not understood why some people get gout and others do not. Surgery, joint injury, and eating too much of certain foods are some of the factors that can lead to gout attacks. SYMPTOMS   An attack of gout comes on quickly. It causes intense pain with redness, swelling, and warmth in a joint.  Fever can occur.  Often, only one joint is involved. Certain joints are more commonly involved:  Base of the big  toe.  Knee.  Ankle.  Wrist.  Finger. Without treatment, an attack usually goes away in a few days to weeks. Between attacks, you usually will not have symptoms, which is different from many other forms of arthritis. DIAGNOSIS  Your caregiver will suspect gout based on your symptoms and  exam. In some cases, tests may be recommended. The tests may include:  Blood tests.  Urine tests.  X-rays.  Joint fluid exam. This exam requires a needle to remove fluid from the joint (arthrocentesis). Using a microscope, gout is confirmed when uric acid crystals are seen in the joint fluid. TREATMENT  There are two phases to gout treatment: treating the sudden onset (acute) attack and preventing attacks (prophylaxis).  Treatment of an Acute Attack.  Medicines are used. These include anti-inflammatory medicines or steroid medicines.  An injection of steroid medicine into the affected joint is sometimes necessary.  The painful joint is rested. Movement can worsen the arthritis.  You may use warm or cold treatments on painful joints, depending which works best for you.  Treatment to Prevent Attacks.  If you suffer from frequent gout attacks, your caregiver may advise preventive medicine. These medicines are started after the acute attack subsides. These medicines either help your kidneys eliminate uric acid from your body or decrease your uric acid production. You may need to stay on these medicines for a very long time.  The early phase of treatment with preventive medicine can be associated with an increase in acute gout attacks. For this reason, during the first few months of treatment, your caregiver may also advise you to take medicines usually used for acute gout treatment. Be sure you understand your caregiver's directions. Your caregiver may make several adjustments to your medicine dose before these medicines are effective.  Discuss dietary treatment with your caregiver or dietitian.  Alcohol and drinks high in sugar and fructose and foods such as meat, poultry, and seafood can increase uric acid levels. Your caregiver or dietitian can advise you on drinks and foods that should be limited. HOME CARE INSTRUCTIONS   Do not take aspirin to relieve pain. This raises uric acid levels.  Only take over-the-counter or prescription medicines for pain, discomfort, or fever as directed by your caregiver.  Rest the joint as much as possible. When in bed, keep sheets and blankets off painful areas.  Keep the affected joint raised (elevated).  Apply warm or cold treatments to painful joints. Use of warm or cold treatments depends on which works best for you.  Use crutches if the painful joint is in your leg.  Drink enough fluids to keep your urine clear or pale yellow. This helps your body get rid of uric acid. Limit alcohol, sugary drinks, and fructose drinks.  Follow your dietary instructions. Pay careful attention to the amount of protein you eat. Your daily diet should emphasize fruits, vegetables, whole grains, and fat-free or low-fat milk products. Discuss the use of coffee, vitamin C, and cherries with your caregiver or dietitian. These may be helpful in lowering uric acid levels.  Maintain a healthy body weight. SEEK MEDICAL CARE IF:   You develop diarrhea, vomiting, or any side effects from medicines.  You do not feel better in 24 hours, or you are getting worse. SEEK IMMEDIATE MEDICAL CARE IF:   Your joint becomes suddenly more tender, and you have chills or a fever. MAKE SURE YOU:   Understand these instructions.  Will watch your condition.  Will get help right away if you are not doing well or get worse.   This information is not intended to replace advice given to you by your health care provider. Make sure you discuss any questions you have with your health care provider.   Document Released: 07/06/2000 Document Revised: 07/30/2014 Document Reviewed:  02/20/2012  Elsevier Interactive Patient Education [employer].         By signing my name below, I, Moises Blood, attest that this documentation has been prepared under the direction and in the presence of Merri Ray, MD. Electronically Signed: Moises Blood, [place]. 05/24/2015 , 4:16 PM .  I personally performed the services described in this documentation, which was scribed in my presence. The recorded information has been reviewed and considered, and addended by me as needed.

## 2015-05-25 LAB — BASIC METABOLIC PANEL
BUN: 34 mg/dL — AB (ref 7–25)
CALCIUM: 9.3 mg/dL (ref 8.6–10.3)
CO2: 22 mmol/L (ref 20–31)
CREATININE: 2.01 mg/dL — AB (ref 0.70–1.25)
Chloride: 108 mmol/L (ref 98–110)
GLUCOSE: 72 mg/dL (ref 65–99)
Potassium: 5.2 mmol/L (ref 3.5–5.3)
SODIUM: 141 mmol/L (ref 135–146)

## 2015-07-04 ENCOUNTER — Encounter: Payer: Self-pay | Admitting: Internal Medicine

## 2015-07-04 ENCOUNTER — Ambulatory Visit (INDEPENDENT_AMBULATORY_CARE_PROVIDER_SITE_OTHER): Payer: Medicare Other | Admitting: Internal Medicine

## 2015-07-04 VITALS — BP 140/80 | HR 99 | Temp 98.2°F | Ht 71.0 in | Wt 331.0 lb

## 2015-07-04 DIAGNOSIS — M150 Primary generalized (osteo)arthritis: Secondary | ICD-10-CM

## 2015-07-04 DIAGNOSIS — M159 Polyosteoarthritis, unspecified: Secondary | ICD-10-CM

## 2015-07-04 DIAGNOSIS — I10 Essential (primary) hypertension: Secondary | ICD-10-CM

## 2015-07-04 DIAGNOSIS — M109 Gout, unspecified: Secondary | ICD-10-CM | POA: Diagnosis not present

## 2015-07-04 DIAGNOSIS — N2889 Other specified disorders of kidney and ureter: Secondary | ICD-10-CM

## 2015-07-04 DIAGNOSIS — E7800 Pure hypercholesterolemia, unspecified: Secondary | ICD-10-CM

## 2015-07-04 MED ORDER — ROSUVASTATIN CALCIUM 5 MG PO TABS: 5.0000 mg | ORAL_TABLET | Freq: Every day | ORAL | Status: DC

## 2015-07-04 MED ORDER — AMLODIPINE BESYLATE 10 MG PO TABS: 10.0000 mg | ORAL_TABLET | Freq: Every day | ORAL | Status: DC

## 2015-07-04 MED ORDER — ALLOPURINOL 100 MG PO TABS: 100.0000 mg | ORAL_TABLET | Freq: Every day | ORAL | Status: DC

## 2015-07-04 MED ORDER — LOSARTAN POTASSIUM 100 MG PO TABS: 100.0000 mg | ORAL_TABLET | Freq: Every day | ORAL | Status: DC

## 2015-07-04 NOTE — Patient Instructions (Signed)
Limit your sodium (Salt) intake  Please check your blood pressure on a regular basis.  If it is consistently greater than 150/90, please make an office appointment.  Low-Purine Diet Purines are compounds that affect the level of uric acid in your body. A low-purine diet is a diet that is low in purines. Eating a low-purine diet can prevent the level of uric acid in your body from getting too high and causing gout or kidney stones or both. WHAT DO I NEED TO KNOW ABOUT THIS DIET?  Choose low-purine foods. Examples of low-purine foods are listed in the next section.  Drink plenty of fluids, especially water. Fluids can help remove uric acid from your body. Try to drink 8-16 cups (1.9-3.8 L) a day.  Limit foods high in fat, especially saturated fat, as fat makes it harder for the body to get rid of uric acid. Foods high in saturated fat include pizza, cheese, ice cream, whole milk, fried foods, and gravies. Choose foods that are lower in fat and lean sources of protein. Use olive oil when cooking as it contains healthy fats that are not high in saturated fat.  Limit alcohol. Alcohol interferes with the elimination of uric acid from your body. If you are having a gout attack, avoid all alcohol.  Keep in mind that different people's bodies react differently to different foods. You will probably learn over time which foods do or do not affect you. If you discover that a food tends to cause your gout to flare up, avoid eating that food. You can more freely enjoy foods that do not cause problems. If you have any questions about a food item, talk to your dietitian or health care provider. WHICH FOODS ARE LOW, MODERATE, AND HIGH IN PURINES? The following is a list of foods that are low, moderate, and high in purines. You can eat any amount of the foods that are low in purines. You may be able to have small amounts of foods that are moderate in purines. Ask your health care provider how much of a food moderate  in purines you can have. Avoid foods high in purines. Grains  Foods low in purines: Enriched white bread, pasta, rice, cake, cornbread, popcorn.  Foods moderate in purines: Whole-grain breads and cereals, wheat germ, bran, oatmeal. Uncooked oatmeal. Dry wheat bran or wheat germ.  Foods high in purines: Pancakes, [place] toast, biscuits, muffins. Vegetables  Foods low in purines: All vegetables, except those that are moderate in purines.  Foods moderate in purines: Asparagus, cauliflower, spinach, mushrooms, green peas. Fruits  All fruits are low in purines. Meats and other Protein Foods  Foods low in purines: Eggs, nuts, peanut butter.  Foods moderate in purines: 80-90% lean beef, lamb, veal, pork, poultry, fish, eggs, peanut butter, nuts. Crab, lobster, oysters, and shrimp. Cooked dried beans, peas, and lentils.  Foods high in purines: Anchovies, sardines, herring, mussels, tuna, codfish, scallops, trout, and haddock. Alex Williams. Organ meats (such as liver or kidney). Tripe. Game meat. Goose. Sweetbreads. Dairy  All dairy foods are low in purines. Low-fat and fat-free dairy products are best because they are low in saturated fat. Beverages  Drinks low in purines: Water, carbonated beverages, tea, coffee, cocoa.  Drinks moderate in purines: Soft drinks and other drinks sweetened with high-fructose corn syrup. Juices. To find whether a food or drink is sweetened with high-fructose corn syrup, look at the ingredients list.  Drinks high in purines: Alcoholic beverages (such as beer). Condiments  Foods low  in purines: Salt, herbs, olives, pickles, relishes, vinegar.  Foods moderate in purines: Butter, margarine, oils, mayonnaise. Fats and Oils  Foods low in purines: All types, except gravies and sauces made with meat.  Foods high in purines: Gravies and sauces made with meat. Other Foods  Foods low in purines: Sugars, sweets, gelatin. Cake. Soups made without meat.  Foods  moderate in purines: Meat-based or fish-based soups, broths, or bouillons. Foods and drinks sweetened with high-fructose corn syrup.  Foods high in purines: High-fat desserts (such as ice cream, cookies, cakes, pies, doughnuts, and chocolate). Contact your dietitian for more information on foods that are not listed here.   This information is not intended to replace advice given to you by your health care provider. Make sure you discuss any questions you have with your health care provider.   Document Released: 11/03/2010 Document Revised: 07/14/2013 Document Reviewed: 06/15/2013 Elsevier Interactive Patient Education [employer].

## 2015-07-04 NOTE — Progress Notes (Signed)
Subjective:    Patient ID: Alex Williams, male    DOB: [birth year], 64 y.o.   MRN: [id number]  HPI  64 year old patient who is a long-time patient, but is seen infrequently.  He has been seen by orthopedics recently due to foot pain and right knee pain.  He does have a history of recurrent gout.  Recent uric acid level is elevated at 9.1 He has chronic kidney disease essential hypertension and dyslipidemia.  Denies any cardiopulmonary complaints.  He has been seen by urology in [place] due to a renal mass.  He is status post ablation in May 2015.  He states the renal lesion was benign.  He has stable chronic kidney disease  He has been prescribed indomethacin in the past for acute episodes of gout but he does not take chronic NSAIDs.  He has also responded well to prednisone  Past Medical History  Diagnosis Date  . COLONIC POLYPS, HX OF 06/27/2007  . EXOGENOUS OBESITY 01/30/2010  . GOUT 01/30/2010  . HYPERCHOLESTEROLEMIA 06/30/2007  . HYPERTENSION 06/27/2007  . LOW BACK PAIN 06/27/2007  . NEPHROLITHIASIS, HX OF 06/27/2007  . OSTEOARTHRITIS 06/27/2007  . SLEEP APNEA, OBSTRUCTIVE, MODERATE 01/27/2009  . Cervical disc disease     Social History   Social History  . Marital Status: Married    Spouse Name: N/A  . Number of Children: N/A  . Years of Education: N/A   Occupational History  . Not on file.   Social History Main Topics  . Smoking status: Former Smoker    Quit date: 07/23/1980  . Smokeless tobacco: Never Used  . Alcohol Use: Yes  . Drug Use: No  . Sexual Activity: Not on file   Other Topics Concern  . Not on file   Social History Narrative    Past Surgical History  Procedure Laterality Date  . Lumbar laminectomy    . Total knee arthroplasty      left  . Cardiac catheterization    . Radiofrequency ablation kidney      Family History  Problem Relation Age of Onset  . Diabetes Neg Hx     family sisters x6 hx of  . Hypertension Other   . Sleep apnea Other      Allergies  Allergen Reactions  . Codeine Phosphate     Current Outpatient Prescriptions on File Prior to Visit  Medication Sig Dispense Refill  . amLODipine (NORVASC) 10 MG tablet TAKE 1 TABLET BY MOUTH EVERY DAY 90 tablet 1  . aspirin 81 MG tablet Take 81 mg by mouth daily.      . benazepril (LOTENSIN) 40 MG tablet TAKE 1 TABLET BY MOUTH ONCE DAILY 90 tablet 3  . cetirizine (ZYRTEC) 10 MG tablet Take 10 mg by mouth daily.    . CRESTOR 5 MG tablet TAKE 1 TABLET EVERY DAY 90 tablet 2  . Fish Oil-Cholecalciferol (OMEGA-3 FISH OIL/VITAMIN D3 PO) Take by mouth daily.      . indomethacin (INDOCIN) 50 MG capsule TAKE ONE CAPSULE 3 TIMES A DAY WITH MEALS AS NEEDED    . indomethacin (INDOCIN) 50 MG capsule TAKE ONE CAPSULE 3 TIMES A DAY WITH MEALS 30 capsule 3  . Multiple Vitamins-Minerals (ONE-A-DAY MENS 50+ ADVANTAGE PO) Take 1 tablet by mouth daily.    . traMADol (ULTRAM) 50 MG tablet Take 1 tablet (50 mg total) by mouth every 6 (six) hours as needed. for pain 90 tablet 2  . vitamin C (ASCORBIC ACID) 500 MG tablet  Take 500 mg by mouth daily.      No current facility-administered medications on file prior to visit.    BP 140/80 mmHg  Pulse 99  Temp(Src) 98.2 F (36.8 C) (Oral)  Ht 5\' 11"  (1.803 m)  Wt 331 lb (150.141 kg)  BMI 46.19 kg/m2  SpO2 98%     Review of Systems  Constitutional: Negative for fever, chills, appetite change and fatigue.  HENT: Negative for congestion, dental problem, ear pain, hearing loss, sore throat, tinnitus, trouble swallowing and voice change.   Eyes: Negative for pain, discharge and visual disturbance.  Respiratory: Negative for cough, chest tightness, wheezing and stridor.   Cardiovascular: Negative for chest pain, palpitations and leg swelling.  Gastrointestinal: Negative for nausea, vomiting, abdominal pain, diarrhea, constipation, blood in stool and abdominal distention.  Genitourinary: Negative for urgency, hematuria, flank pain, discharge,  difficulty urinating and genital sores.  Musculoskeletal: Positive for back pain, joint swelling, arthralgias and gait problem. Negative for myalgias and neck stiffness.  Skin: Negative for rash.  Neurological: Negative for dizziness, syncope, speech difficulty, weakness, numbness and headaches.  Hematological: Negative for adenopathy. Does not bruise/bleed easily.  Psychiatric/Behavioral: Negative for behavioral problems and dysphoric mood. The patient is not nervous/anxious.        Objective:   Physical Exam  Constitutional: He is oriented to person, place, and time. He appears well-developed.  Blood pressure 130/80  HENT:  Head: Normocephalic.  Right Ear: External ear normal.  Left Ear: External ear normal.  Eyes: Conjunctivae and EOM are normal.  Neck: Normal range of motion.  Cardiovascular: Normal rate and normal heart sounds.   Pulmonary/Chest: Breath sounds normal.  Abdominal: Bowel sounds are normal.  Musculoskeletal: Normal range of motion. He exhibits edema. He exhibits no tenderness.  Neurological: He is alert and oriented to person, place, and time.  Psychiatric: He has a normal mood and affect. His behavior is normal.          Assessment & Plan:   Hypertension, stable Recurrent gout.  Will place on allopurinol 100 mg.  Will stress a low purine diet.  We'll switch to losartan for its uricosuric effect Dyslipidemia.  Crestor refilled  Schedule CPX

## 2015-07-04 NOTE — Progress Notes (Signed)
Pre visit review using our clinic review tool, if applicable. No additional management support is needed unless otherwise documented below in the visit note. 

## 2015-07-11 ENCOUNTER — Other Ambulatory Visit: Payer: Medicare Other

## 2015-07-11 ENCOUNTER — Other Ambulatory Visit: Payer: Self-pay | Admitting: Internal Medicine

## 2015-07-11 ENCOUNTER — Other Ambulatory Visit: Payer: Self-pay | Admitting: Urology

## 2015-07-11 DIAGNOSIS — N281 Cyst of kidney, acquired: Secondary | ICD-10-CM | POA: Diagnosis not present

## 2015-07-11 DIAGNOSIS — N289 Disorder of kidney and ureter, unspecified: Secondary | ICD-10-CM

## 2015-07-11 DIAGNOSIS — N183 Chronic kidney disease, stage 3 (moderate): Secondary | ICD-10-CM | POA: Diagnosis not present

## 2015-07-27 ENCOUNTER — Other Ambulatory Visit: Payer: Medicare Other

## 2015-08-06 ENCOUNTER — Inpatient Hospital Stay: Admission: RE | Admit: 2015-08-06 | Payer: Medicare Other | Source: Ambulatory Visit

## 2015-08-07 ENCOUNTER — Ambulatory Visit (INDEPENDENT_AMBULATORY_CARE_PROVIDER_SITE_OTHER): Payer: Medicare Other | Admitting: Family Medicine

## 2015-08-07 VITALS — BP 132/78 | HR 118 | Temp 99.0°F | Resp 20 | Ht 72.0 in | Wt 318.4 lb

## 2015-08-07 DIAGNOSIS — R300 Dysuria: Secondary | ICD-10-CM | POA: Diagnosis not present

## 2015-08-07 LAB — POC MICROSCOPIC URINALYSIS (UMFC): Mucus: ABSENT

## 2015-08-07 LAB — POCT URINALYSIS DIP (MANUAL ENTRY)
Bilirubin, UA: NEGATIVE
Glucose, UA: NEGATIVE
Ketones, POC UA: NEGATIVE
Nitrite, UA: NEGATIVE
Protein Ur, POC: >=300 — AB
Spec Grav, UA: 1.02
Urobilinogen, UA: 0.2
pH, UA: 5.5

## 2015-08-07 MED ORDER — CIPROFLOXACIN HCL 500 MG PO TABS: 500.0000 mg | ORAL_TABLET | Freq: Two times a day (BID) | ORAL | Status: DC

## 2015-08-07 NOTE — Progress Notes (Signed)
Patient ID: Alex Williams, male   DOB: 12-17-1950, 65 y.o.   MRN: [id number]   This chart was scribed for Robyn Haber, MD by [facility], medical scribe at [facility].The patient was seen in exam room 14 and the patient's care was started at 12:25 PM.  Patient ID: Alex Williams MRN: [id number], DOB: 10-10-1950, 65 y.o. Date of Encounter: 08/07/2015  Primary Physician: Nyoka Cowden, MD  Chief Complaint:  Chief Complaint  Patient presents with  . Urinary Tract Infection    x 2 days   HPI:  Alex Williams is a 65 y.o. male who presents to [facility] because of a dysuria with associated with a difficulty to urinate for the past two days. He had a fever and chills last night. Kidney ablation last year due to a mass on top of his kidney. Hx of UTI.  Past Medical History  Diagnosis Date  . COLONIC POLYPS, HX OF 06/27/2007  . EXOGENOUS OBESITY 01/30/2010  . GOUT 01/30/2010  . HYPERCHOLESTEROLEMIA 06/30/2007  . HYPERTENSION 06/27/2007  . LOW BACK PAIN 06/27/2007  . NEPHROLITHIASIS, HX OF 06/27/2007  . OSTEOARTHRITIS 06/27/2007  . SLEEP APNEA, OBSTRUCTIVE, MODERATE 01/27/2009  . Cervical disc disease     Home Meds: Prior to Admission medications   Medication Sig Start Date End Date Taking? Authorizing Provider  amLODipine (NORVASC) 10 MG tablet Take 1 tablet (10 mg total) by mouth daily. 07/04/15  Yes Marletta Lor, MD  aspirin 81 MG tablet Take 81 mg by mouth daily.     Yes Historical Provider, MD  cetirizine (ZYRTEC) 10 MG tablet Take 10 mg by mouth daily. Reported on 08/07/2015   Yes Historical Provider, MD  Cholecalciferol (VITAMIN D-3 PO) Take by mouth.   Yes Historical Provider, MD  Fish Oil-Cholecalciferol (OMEGA-3 FISH OIL/VITAMIN D3 PO) Take by mouth daily.     Yes Historical Provider, MD  losartan (COZAAR) 100 MG tablet Take 1 tablet (100 mg total) by mouth daily. 07/04/15  Yes Marletta Lor, MD  Multiple Vitamins-Minerals  (ONE-A-DAY MENS 50+ ADVANTAGE PO) Take 1 tablet by mouth daily.   Yes Historical Provider, MD  rosuvastatin (CRESTOR) 5 MG tablet Take 1 tablet (5 mg total) by mouth daily. 07/04/15  Yes Marletta Lor, MD  traMADol (ULTRAM) 50 MG tablet Take 1 tablet (50 mg total) by mouth every 6 (six) hours as needed. for pain 08/02/14  Yes Marletta Lor, MD  vitamin C (ASCORBIC ACID) 500 MG tablet Take 500 mg by mouth daily.    Yes Historical Provider, MD  allopurinol (ZYLOPRIM) 100 MG tablet Take 1 tablet (100 mg total) by mouth daily. 07/04/15   Marletta Lor, MD  benazepril (LOTENSIN) 40 MG tablet TAKE 1 TABLET BY MOUTH EVERY DAY Patient not taking: Reported on 08/07/2015 07/11/15   Marin Olp, MD   Allergies:  Allergies  Allergen Reactions  . Codeine Phosphate    Social History   Social History  . Marital Status: Married    Spouse Name: N/A  . Number of Children: N/A  . Years of Education: N/A   Occupational History  . Not on file.   Social History Main Topics  . Smoking status: Former Smoker    Quit date: 07/23/1980  . Smokeless tobacco: Never Used  . Alcohol Use: Yes  . Drug Use: No  . Sexual Activity: Not on file   Other Topics Concern  . Not on file  Social History Narrative    Review of Systems: Constitutional: negative for night sweats, weight changes, or fatigue. Positive for chills and fever. HEENT: negative for vision changes, hearing loss, congestion, rhinorrhea, ST, epistaxis, or sinus pressure Cardiovascular: negative for chest pain or palpitations Respiratory: negative for hemoptysis, wheezing, shortness of breath, or cough Abdominal: negative for abdominal pain, nausea, vomiting, diarrhea, or constipation Dermatological: negative for rash Genitourinary: Positive for dysuria and difficulty urinating.  Neurologic: negative for headache, dizziness, or syncope All other systems reviewed and are otherwise negative with the exception to those above  and in the HPI.  Physical Exam: Blood pressure 132/78, pulse 118, temperature 99 F (37.2 C), temperature source Oral, resp. rate 20, height 6' (1.829 m), weight 318 lb 6.4 oz (144.425 kg), SpO2 97 %., Body mass index is 43.17 kg/(m^2). General: Well developed, well nourished, in no acute distress. Head: Normocephalic, atraumatic, eyes without discharge, sclera non-icteric, nares are without discharge. Bilateral auditory canals clear, TM's are without perforation, pearly grey and translucent with reflective cone of light bilaterally. Oral cavity moist, posterior pharynx without exudate, erythema, peritonsillar abscess, or post nasal drip.  Neck: Supple. No thyromegaly. Full ROM. No lymphadenopathy. Lungs: Clear bilaterally to auscultation without wheezes, rales, or rhonchi. Breathing is unlabored. Heart: RRR with S1 S2. No murmurs, rubs, or gallops appreciated. Abdomen: Soft, non-tender, non-distended with normoactive bowel sounds. No hepatomegaly. No rebound/guarding. No obvious abdominal masses. No CVA tenderness. Msk:  Strength and tone normal for age. Extremities/Skin: Warm and dry. No clubbing or cyanosis. No edema. No rashes or suspicious lesions. Neuro: Alert and oriented X 3. Moves all extremities spontaneously. Gait is normal. CNII-XII grossly in tact. Psych:  Responds to questions appropriately with a normal affect.   Labs: Results for orders placed or performed in visit on 08/07/15  POCT urinalysis dipstick  Result Value Ref Range   Color, UA yellow yellow   Clarity, UA cloudy (A) clear   Glucose, UA negative negative   Bilirubin, UA negative negative   Ketones, POC UA negative negative   Spec Grav, UA 1.020    Blood, UA large (A) negative   pH, UA 5.5    Protein Ur, POC >=300 (A) negative   Urobilinogen, UA 0.2    Nitrite, UA Negative Negative   Leukocytes, UA moderate (2+) (A) Negative  POCT Microscopic Urinalysis ([facility])  Result Value Ref Range   WBC,UR,HPF,POC Too  numerous to count  (A) None WBC/hpf   RBC,UR,HPF,POC Too numerous to count  (A) None RBC/hpf   Bacteria Many (A) None, Too numerous to count   Mucus Absent Absent   Epithelial Cells, UR Per Microscopy Few (A) None, Too numerous to count cells/hpf     ASSESSMENT AND PLAN:  66 y.o. year old male with UTI  By signing my name below, I, Nadim Abuhashem, attest that this documentation has been prepared under the direction and in the presence of Robyn Haber, MD.  Electronically Signed: Lora Havens, medical scribe. 08/07/2015 12:35 PM.  This chart was scribed in my presence and reviewed by me personally.    ICD-9-CM ICD-10-CM   1. Dysuria 788.1 R30.0 POCT urinalysis dipstick     POCT Microscopic Urinalysis ([facility])     Urine culture     ciprofloxacin (CIPRO) 500 MG tablet     Signed, Robyn Haber, MD

## 2015-08-07 NOTE — Patient Instructions (Signed)

## 2015-08-09 LAB — URINE CULTURE: Colony Count: >=100000

## 2015-08-13 ENCOUNTER — Ambulatory Visit
Admission: RE | Admit: 2015-08-13 | Discharge: 2015-08-13 | Disposition: A | Discharge status: Home / Self Care | Payer: Medicare Other | Source: Ambulatory Visit | Attending: Urology | Admitting: Urology

## 2015-08-13 DIAGNOSIS — N289 Disorder of kidney and ureter, unspecified: Secondary | ICD-10-CM

## 2015-09-23 ENCOUNTER — Ambulatory Visit (INDEPENDENT_AMBULATORY_CARE_PROVIDER_SITE_OTHER): Payer: Medicare Other | Admitting: Urgent Care

## 2015-09-23 ENCOUNTER — Ambulatory Visit (INDEPENDENT_AMBULATORY_CARE_PROVIDER_SITE_OTHER): Payer: Medicare Other

## 2015-09-23 VITALS — BP 148/76 | HR 88 | Temp 99.0°F | Resp 16 | Ht 72.0 in | Wt 324.0 lb

## 2015-09-23 DIAGNOSIS — R3129 Other microscopic hematuria: Secondary | ICD-10-CM | POA: Diagnosis not present

## 2015-09-23 DIAGNOSIS — M19071 Primary osteoarthritis, right ankle and foot: Secondary | ICD-10-CM | POA: Diagnosis not present

## 2015-09-23 DIAGNOSIS — L03115 Cellulitis of right lower limb: Secondary | ICD-10-CM

## 2015-09-23 DIAGNOSIS — M25471 Effusion, right ankle: Secondary | ICD-10-CM | POA: Diagnosis not present

## 2015-09-23 DIAGNOSIS — R300 Dysuria: Secondary | ICD-10-CM | POA: Diagnosis not present

## 2015-09-23 DIAGNOSIS — R350 Frequency of micturition: Secondary | ICD-10-CM

## 2015-09-23 DIAGNOSIS — R809 Proteinuria, unspecified: Secondary | ICD-10-CM | POA: Diagnosis not present

## 2015-09-23 DIAGNOSIS — M25571 Pain in right ankle and joints of right foot: Secondary | ICD-10-CM

## 2015-09-23 DIAGNOSIS — I10 Essential (primary) hypertension: Secondary | ICD-10-CM

## 2015-09-23 DIAGNOSIS — M7989 Other specified soft tissue disorders: Secondary | ICD-10-CM | POA: Diagnosis not present

## 2015-09-23 LAB — COMPLETE METABOLIC PANEL WITH GFR
ALK PHOS: 98 U/L (ref 40–115)
ALT: 11 U/L (ref 9–46)
AST: 13 U/L (ref 10–35)
Albumin: 4.1 g/dL (ref 3.6–5.1)
BUN: 50 mg/dL — ABNORMAL HIGH (ref 7–25)
CO2: 18 mmol/L — AB (ref 20–31)
Calcium: 9.3 mg/dL (ref 8.6–10.3)
Chloride: 112 mmol/L — ABNORMAL HIGH (ref 98–110)
Creat: 2.19 mg/dL — ABNORMAL HIGH (ref 0.70–1.25)
GFR, EST NON AFRICAN AMERICAN: 30 mL/min — AB (ref >=60)
GFR, Est African American: 35 mL/min — ABNORMAL LOW (ref >=60)
GLUCOSE: 75 mg/dL (ref 65–99)
POTASSIUM: 5.2 mmol/L (ref 3.5–5.3)
SODIUM: 142 mmol/L (ref 135–146)
Total Bilirubin: 0.5 mg/dL (ref 0.2–1.2)
Total Protein: 7 g/dL (ref 6.1–8.1)

## 2015-09-23 LAB — POCT URINALYSIS DIP (MANUAL ENTRY)
Bilirubin, UA: NEGATIVE
GLUCOSE UA: NEGATIVE
Ketones, POC UA: NEGATIVE
NITRITE UA: NEGATIVE
Protein Ur, POC: 100 — AB
Spec Grav, UA: 1.02
UROBILINOGEN UA: 0.2
pH, UA: 5.5

## 2015-09-23 LAB — URIC ACID: URIC ACID, SERUM: 7.2 mg/dL (ref 4.0–7.8)

## 2015-09-23 LAB — POCT CBC
GRANULOCYTE PERCENT: 70.8 % (ref 37–80)
HCT, POC: 34.7 % — AB (ref 43.5–53.7)
Hemoglobin: 11.6 g/dL — AB (ref 14.1–18.1)
Lymph, poc: 3.3 (ref 0.6–3.4)
MCH: 24.7 pg — AB (ref 27–31.2)
MCHC: 33.4 g/dL (ref 31.8–35.4)
MCV: 73.8 fL — AB (ref 80–97)
MID (cbc): 1.3 — AB (ref 0–0.9)
MPV: 6.6 fL (ref 0–99.8)
PLATELET COUNT, POC: 298 10*3/uL (ref 142–424)
POC Granulocyte: 11.3 — AB (ref 2–6.9)
POC LYMPH PERCENT: 20.9 %L (ref 10–50)
POC MID %: 8.3 %M (ref 0–12)
RBC: 4.7 M/uL (ref 4.69–6.13)
RDW, POC: 17.7 %
WBC: 15.9 10*3/uL — AB (ref 4.6–10.2)

## 2015-09-23 LAB — POC MICROSCOPIC URINALYSIS (UMFC): Mucus: ABSENT

## 2015-09-23 LAB — POCT GLYCOSYLATED HEMOGLOBIN (HGB A1C): Hemoglobin A1C: 5.7

## 2015-09-23 MED ORDER — DOXYCYCLINE HYCLATE 100 MG PO CAPS: 100.0000 mg | ORAL_CAPSULE | Freq: Two times a day (BID) | ORAL | Status: DC

## 2015-09-23 NOTE — Progress Notes (Signed)
MRN: [id number] DOB: 15-Jun-1951  Subjective:   Alex Williams is a 65 y.o. male presenting for chief complaint of Ankle Pain  Reports 2 day history of right ankle swelling, worsening sharp pain. Pain is constant and worse with movement of his right ankle. Admits history of gout (right MTP) and is currently taking allopurinol. He tried using indomethacin without any relief. Denies fever, trauma, history of ankle injuries, history of surgeries.  Alex Williams has a current medication list which includes the following prescription(s): allopurinol, amlodipine, aspirin, cetirizine, cholecalciferol, fish oil-cholecalciferol, losartan, multiple vitamins-minerals, rosuvastatin, tramadol, and vitamin c. Also is allergic to codeine phosphate.  Alex Williams  has a past medical history of COLONIC POLYPS, HX OF (06/27/2007); EXOGENOUS OBESITY (01/30/2010); GOUT (01/30/2010); HYPERCHOLESTEROLEMIA (06/30/2007); HYPERTENSION (06/27/2007); LOW BACK PAIN (06/27/2007); NEPHROLITHIASIS, HX OF (06/27/2007); OSTEOARTHRITIS (06/27/2007); SLEEP APNEA, OBSTRUCTIVE, MODERATE (01/27/2009); and Cervical disc disease. Also  has past surgical history that includes Lumbar laminectomy; Total knee arthroplasty; Cardiac catheterization; and Radiofrequency ablation kidney.  Objective:   Vitals: BP 148/76 mmHg  Pulse 88  Temp(Src) 99 F (37.2 C) (Oral)  Resp 16  Ht 6' (1.829 m)  Wt 324 lb (146.965 kg)  BMI 43.93 kg/m2  SpO2 99%  Physical Exam  Constitutional: He is oriented to person, place, and time. He appears well-developed and well-nourished.  Cardiovascular: Normal rate, regular rhythm and intact distal pulses.  Exam reveals no gallop and no friction rub.   No murmur heard. Pulmonary/Chest: Effort normal. No respiratory distress. He has no wheezes. He has no rales.  Musculoskeletal:       Right ankle: He exhibits decreased range of motion and swelling. He exhibits no ecchymosis, no deformity and no laceration. Tenderness. Medial  malleolus tenderness found. No lateral malleolus, no posterior TFL, no head of 5th metatarsal and no proximal fibula tenderness found. Achilles tendon exhibits no pain and no defect.  Neurological: He is alert and oriented to person, place, and time.    Right ankle measures 29.5cm, left ankle measures 27cm.  Results for orders placed or performed in visit on 09/23/15 (from the past 24 hour(s))  POCT CBC     Status: Abnormal   Collection Time: 09/23/15 12:08 PM  Result Value Ref Range   WBC 15.9 (A) 4.6 - 10.2 K/uL   Lymph, poc 3.3 0.6 - 3.4   POC LYMPH PERCENT 20.9 10 - 50 %L   MID (cbc) 1.3 (A) 0 - 0.9   POC MID % 8.3 0 - 12 %M   POC Granulocyte 11.3 (A) 2 - 6.9   Granulocyte percent 70.8 37 - 80 %G   RBC 4.70 4.69 - 6.13 M/uL   Hemoglobin 11.6 (A) 14.1 - 18.1 g/dL   HCT, POC 34.7 (A) 43.5 - 53.7 %   MCV 73.8 (A) 80 - 97 fL   MCH, POC 24.7 (A) 27 - 31.2 pg   MCHC 33.4 31.8 - 35.4 g/dL   RDW, POC 17.7 %   Platelet Count, POC 298 142 - 424 K/uL   MPV 6.6 0 - 99.8 fL  POCT glycosylated hemoglobin (Hb A1C)     Status: None   Collection Time: 09/23/15 12:55 PM  Result Value Ref Range   Hemoglobin A1C 5.7   POCT urinalysis dipstick     Status: Abnormal   Collection Time: 09/23/15 12:55 PM  Result Value Ref Range   Color, UA yellow yellow   Clarity, UA cloudy (A) clear   Glucose, UA negative negative   Bilirubin, UA  negative negative   Ketones, POC UA negative negative   Spec Grav, UA 1.020    Blood, UA small (A) negative   pH, UA 5.5    Protein Ur, POC =100 (A) negative   Urobilinogen, UA 0.2    Nitrite, UA Negative Negative   Leukocytes, UA moderate (2+) (A) Negative  POCT Microscopic Urinalysis ([facility])     Status: Abnormal   Collection Time: 09/23/15 12:55 PM  Result Value Ref Range   WBC,UR,HPF,POC Too numerous to count  (A) None WBC/hpf   RBC,UR,HPF,POC Too numerous to count  (A) None RBC/hpf   Bacteria Few (A) None, Too numerous to count   Mucus Absent Absent    Epithelial Cells, UR Per Microscopy Few (A) None, Too numerous to count cells/hpf   Dg Ankle Complete Right  09/23/2015  CLINICAL DATA:  Right ankle pain and swelling for 2 days. No known injury. EXAM: RIGHT ANKLE - COMPLETE 3+ VIEW COMPARISON:  None. FINDINGS: Degenerative changes at the right ankle with joint space narrowing and spurring. Well corticated bone fragments adjacent to the lateral malleolus, likely due related to old injury or degenerative disease. No acute fracture, subluxation or dislocation. IMPRESSION: No acute bony abnormality. Moderate degenerative changes within the right ankle. Electronically Signed   By: Rolm Baptise M.D.   On: 09/23/2015 12:05   Assessment and Plan :   1. Cellulitis of right ankle 2. Right ankle pain 3. Right ankle swelling - Uric acid, Cmet pending. Will cover for infectious process given his cbc. Recheck tomorrow. Fast track card provided.  4. Essential hypertension - Stable, elevated BP reading likely related to infectious process. Recheck tomorrow.  5. Morbid obesity, unspecified obesity type ([place]) 6. Urinary frequency 7. Dysuria 8. Microscopic hematuria 9. Proteinuria - Urine culture pending, recommended patient call his nephrologist, Dr. Rosana Hoes for f/u. States that he has an appointment scheduled for April.   Jaynee Eagles, PA-C [facility] [phone] 09/23/2015 11:42 AM

## 2015-09-23 NOTE — Patient Instructions (Addendum)
Because you received an x-ray today, you will receive an invoice from [facility]. Please contact [facility] at [phone] with questions or concerns regarding your invoice. Our billing staff will not be able to assist you with those questions. Because you received labwork today, you will receive an invoice from [employer]. Please contact [facility] at [phone] with questions or concerns regarding your invoice. Our billing staff will not be able to assist you with those questions.  You will be contacted with the lab results as soon as they are available. The fastest way to get your results is to activate your My Chart account. Instructions are located on the last page of this paperwork. If you have not heard from [place] regarding the results in 2 weeks, please contact this office.   Cellulitis Cellulitis is an infection of the skin and the tissue beneath it. The infected area is usually red and tender. Cellulitis occurs most often in the arms and lower legs.  CAUSES  Cellulitis is caused by bacteria that enter the skin through cracks or cuts in the skin. The most common types of bacteria that cause cellulitis are staphylococci and streptococci. SIGNS AND SYMPTOMS   Redness and warmth.  Swelling.  Tenderness or pain.  Fever. DIAGNOSIS  Your health care provider can usually determine what is wrong based on a physical exam. Blood tests may also be done. TREATMENT  Treatment usually involves taking an antibiotic medicine. HOME CARE INSTRUCTIONS   Take your antibiotic medicine as directed by your health care provider. Finish the antibiotic even if you start to feel better.  Keep the infected arm or leg elevated to reduce swelling.  Apply a warm cloth to the affected area up to 4 times per day to relieve pain.  Take medicines only as directed by your health care provider.  Keep all follow-up visits as directed by your health care  provider. SEEK MEDICAL CARE IF:   You notice red streaks coming from the infected area.  Your red area gets larger or turns dark in color.  Your bone or joint underneath the infected area becomes painful after the skin has healed.  Your infection returns in the same area or another area.  You notice a swollen bump in the infected area.  You develop new symptoms.  You have a fever. SEEK IMMEDIATE MEDICAL CARE IF:   You feel very sleepy.  You develop vomiting or diarrhea.   You have a general ill feeling (malaise) with muscle aches and pains.   This information is not intended to replace advice given to you by your health care provider. Make sure you discuss any questions you have with your health care provider.   Document Released: 04/18/2005 Document Revised: 03/30/2015 Document Reviewed: 09/24/2011 Elsevier Interactive Patient Education [employer].

## 2015-09-24 ENCOUNTER — Ambulatory Visit (INDEPENDENT_AMBULATORY_CARE_PROVIDER_SITE_OTHER): Payer: Medicare Other | Admitting: Urgent Care

## 2015-09-24 VITALS — BP 140/80 | HR 92 | Temp 98.0°F | Resp 16 | Wt 321.0 lb

## 2015-09-24 DIAGNOSIS — L03115 Cellulitis of right lower limb: Secondary | ICD-10-CM

## 2015-09-24 LAB — POCT CBC
GRANULOCYTE PERCENT: 69.7 % (ref 37–80)
HCT, POC: 31.7 % — AB (ref 43.5–53.7)
Hemoglobin: 11 g/dL — AB (ref 14.1–18.1)
Lymph, poc: 2.7 (ref 0.6–3.4)
MCH: 25.6 pg — AB (ref 27–31.2)
MCHC: 34.9 g/dL (ref 31.8–35.4)
MCV: 73.3 fL — AB (ref 80–97)
MID (cbc): 0.9 (ref 0–0.9)
MPV: 6.5 fL (ref 0–99.8)
PLATELET COUNT, POC: 277 10*3/uL (ref 142–424)
POC Granulocyte: 8.3 — AB (ref 2–6.9)
POC LYMPH %: 22.6 % (ref 10–50)
POC MID %: 7.7 %M (ref 0–12)
RBC: 4.32 M/uL — AB (ref 4.69–6.13)
RDW, POC: 17.5 %
WBC: 11.9 10*3/uL — AB (ref 4.6–10.2)

## 2015-09-24 NOTE — Progress Notes (Signed)
    MRN: [id number] DOB: 06/13/1951  Subjective:   Alex Williams is a 65 y.o. male presenting for follow up on cellulitis of his right ankle. He has started taking doxycycline, has had 3 doses. Denies fever, chest pain, shob, calf pain, n/v, abdominal pain. He states that his swelling, redness and pain have improved but are still there. He stopped allopurinol as instructed.   [facility] has a current medication list which includes the following prescription(s): amlodipine, aspirin, cetirizine, cholecalciferol, doxycycline, fish oil-cholecalciferol, losartan, multiple vitamins-minerals, rosuvastatin, tramadol, vitamin c, and allopurinol. Also is allergic to codeine phosphate.  Alex Williams  has a past medical history of COLONIC POLYPS, HX OF (06/27/2007); EXOGENOUS OBESITY (01/30/2010); GOUT (01/30/2010); HYPERCHOLESTEROLEMIA (06/30/2007); HYPERTENSION (06/27/2007); LOW BACK PAIN (06/27/2007); NEPHROLITHIASIS, HX OF (06/27/2007); OSTEOARTHRITIS (06/27/2007); SLEEP APNEA, OBSTRUCTIVE, MODERATE (01/27/2009); and Cervical disc disease. Also  has past surgical history that includes Lumbar laminectomy; Total knee arthroplasty; Cardiac catheterization; and Radiofrequency ablation kidney.  Objective:   Vitals: BP 140/80 mmHg  Pulse 92  Temp(Src) 98 F (36.7 C) (Oral)  Resp 16  Wt 321 lb (145.605 kg)  SpO2 99%  Physical Exam  Constitutional: He is oriented to person, place, and time. He appears well-developed and well-nourished.  HENT:  Mouth/Throat: Oropharynx is clear and moist.  Eyes: Pupils are equal, round, and reactive to light. No scleral icterus.  Cardiovascular: Normal rate, regular rhythm and intact distal pulses.  Exam reveals no gallop and no friction rub.   No murmur heard. Pulmonary/Chest: No respiratory distress. He has no wheezes. He has no rales.  Abdominal: Soft. Bowel sounds are normal. He exhibits no distension and no mass. There is no tenderness.  No CVA tenderness.  Neurological: He is alert  and oriented to person, place, and time.  Skin: Skin is warm and dry.   Results for orders placed or performed in visit on 09/24/15 (from the past 24 hour(s))  POCT CBC     Status: Abnormal   Collection Time: 09/24/15  8:51 AM  Result Value Ref Range   WBC 11.9 (A) 4.6 - 10.2 K/uL   Lymph, poc 2.7 0.6 - 3.4   POC LYMPH PERCENT 22.6 10 - 50 %L   MID (cbc) 0.9 0 - 0.9   POC MID % 7.7 0 - 12 %M   POC Granulocyte 8.3 (A) 2 - 6.9   Granulocyte percent 69.7 37 - 80 %G   RBC 4.32 (A) 4.69 - 6.13 M/uL   Hemoglobin 11.0 (A) 14.1 - 18.1 g/dL   HCT, POC 31.7 (A) 43.5 - 53.7 %   MCV 73.3 (A) 80 - 97 fL   MCH, POC 25.6 (A) 27 - 31.2 pg   MCHC 34.9 31.8 - 35.4 g/dL   RDW, POC 17.5 %   Platelet Count, POC 277 142 - 424 K/uL   MPV 6.5 0 - 99.8 fL   Assessment and Plan :   1. Cellulitis of right ankle - Improved, continue doxycycline. RTC tomorrow and if he continues to improve will see back once antibiotics are finished. Urine culture still pending, advised aggressive hydration.  Jaynee Eagles, PA-C [facility] [phone] 09/24/2015 8:41 AM

## 2015-09-24 NOTE — Patient Instructions (Signed)

## 2015-09-25 ENCOUNTER — Ambulatory Visit (INDEPENDENT_AMBULATORY_CARE_PROVIDER_SITE_OTHER): Payer: Medicare Other | Admitting: Urgent Care

## 2015-09-25 VITALS — BP 142/80 | HR 98 | Temp 98.2°F | Resp 20 | Wt 322.8 lb

## 2015-09-25 DIAGNOSIS — M10071 Idiopathic gout, right ankle and foot: Secondary | ICD-10-CM | POA: Diagnosis not present

## 2015-09-25 DIAGNOSIS — L03115 Cellulitis of right lower limb: Secondary | ICD-10-CM

## 2015-09-25 DIAGNOSIS — M109 Gout, unspecified: Secondary | ICD-10-CM

## 2015-09-25 DIAGNOSIS — M25571 Pain in right ankle and joints of right foot: Secondary | ICD-10-CM

## 2015-09-25 DIAGNOSIS — M25471 Effusion, right ankle: Secondary | ICD-10-CM | POA: Diagnosis not present

## 2015-09-25 DIAGNOSIS — I10 Essential (primary) hypertension: Secondary | ICD-10-CM | POA: Diagnosis not present

## 2015-09-25 LAB — POCT CBC
Granulocyte percent: 68.6 %G (ref 37–80)
HEMATOCRIT: 32.1 % — AB (ref 43.5–53.7)
HEMOGLOBIN: 11.2 g/dL — AB (ref 14.1–18.1)
Lymph, poc: 2.8 (ref 0.6–3.4)
MCH: 25.6 pg — AB (ref 27–31.2)
MCHC: 35 g/dL (ref 31.8–35.4)
MCV: 73 fL — AB (ref 80–97)
MID (CBC): 0.9 (ref 0–0.9)
MPV: 6.4 fL (ref 0–99.8)
PLATELET COUNT, POC: 297 10*3/uL (ref 142–424)
POC Granulocyte: 8 — AB (ref 2–6.9)
POC LYMPH PERCENT: 23.7 %L (ref 10–50)
POC MID %: 7.7 %M (ref 0–12)
RBC: 4.39 M/uL — AB (ref 4.69–6.13)
RDW, POC: 17.7 %
WBC: 11.7 10*3/uL — AB (ref 4.6–10.2)

## 2015-09-25 MED ORDER — TRAMADOL HCL 50 MG PO TABS: 50.0000 mg | ORAL_TABLET | Freq: Three times a day (TID) | ORAL | Status: DC | PRN

## 2015-09-25 MED ORDER — PREDNISONE 20 MG PO TABS
ORAL_TABLET | ORAL | Status: DC
Start: 2015-09-25 — End: 2015-09-28

## 2015-09-25 NOTE — Progress Notes (Signed)
    MRN: [id number] DOB: 08-09-1950  Subjective:   Alex Williams is a 65 y.o. male presenting for follow up on cellulitis. Patient continues to take doxycycline. He notes some improvement in his swelling, pain but still is bothering him significantly. He continues to prop his leg up and uses his crutch to walk. Denies fever, worsening redness, radiation of his pain.  Alex Williams has a current medication list which includes the following prescription(s): amlodipine, aspirin, cetirizine, cholecalciferol, doxycycline, fish oil-cholecalciferol, losartan, multiple vitamins-minerals, rosuvastatin, tramadol, vitamin c, and allopurinol. Also is allergic to codeine phosphate.  Alex Williams  has a past medical history of COLONIC POLYPS, HX OF (06/27/2007); EXOGENOUS OBESITY (01/30/2010); GOUT (01/30/2010); HYPERCHOLESTEROLEMIA (06/30/2007); HYPERTENSION (06/27/2007); LOW BACK PAIN (06/27/2007); NEPHROLITHIASIS, HX OF (06/27/2007); OSTEOARTHRITIS (06/27/2007); SLEEP APNEA, OBSTRUCTIVE, MODERATE (01/27/2009); and Cervical disc disease. Also  has past surgical history that includes Lumbar laminectomy; Total knee arthroplasty; Cardiac catheterization; and Radiofrequency ablation kidney.  Objective:   Vitals: BP 142/80 mmHg  Pulse 98  Temp(Src) 98.2 F (36.8 C) (Oral)  Resp 20  Wt 322 lb 12.8 oz (146.421 kg)  SpO2 98%  Physical Exam  Constitutional: He is oriented to person, place, and time. He appears well-developed and well-nourished.  Cardiovascular: Normal rate.   Pulmonary/Chest: Effort normal.  Musculoskeletal:       Right ankle: He exhibits swelling (29cm right versus 27cm for his left). He exhibits normal range of motion, no ecchymosis, no deformity and no laceration. Tenderness. Lateral malleolus and medial malleolus tenderness found. Achilles tendon exhibits no pain and no defect.  Neurological: He is alert and oriented to person, place, and time.   Results for orders placed or performed in visit on 09/25/15 (from  the past 24 hour(s))  POCT CBC     Status: Abnormal   Collection Time: 09/25/15  8:59 AM  Result Value Ref Range   WBC 11.7 (A) 4.6 - 10.2 K/uL   Lymph, poc 2.8 0.6 - 3.4   POC LYMPH PERCENT 23.7 10 - 50 %L   MID (cbc) 0.9 0 - 0.9   POC MID % 7.7 0 - 12 %M   POC Granulocyte 8.0 (A) 2 - 6.9   Granulocyte percent 68.6 37 - 80 %G   RBC 4.39 (A) 4.69 - 6.13 M/uL   Hemoglobin 11.2 (A) 14.1 - 18.1 g/dL   HCT, POC 32.1 (A) 43.5 - 53.7 %   MCV 73.0 (A) 80 - 97 fL   MCH, POC 25.6 (A) 27 - 31.2 pg   MCHC 35.0 31.8 - 35.4 g/dL   RDW, POC 17.7 %   Platelet Count, POC 297 142 - 424 K/uL   MPV 6.4 0 - 99.8 fL    Assessment and Plan :   This case was precepted with Dr. Laney Pastor.  1. Cellulitis of right ankle 2. Right ankle pain 3. Right ankle swelling - Continue doxycycline to continue covering for cellulitis.  4. Acute gout of right ankle, unspecified cause - Due to slow progression of ankle swelling and exquisite tenderness over medial malleolus, will cover for gout with prednisone given his Stage 3 kidney disease. Patient is to rtc in 3 days.  5. Essential hypertension - Improved, likely elevated due to his pain.  Jaynee Eagles, PA-C [facility] [phone] 09/25/2015 8:27 AM

## 2015-09-25 NOTE — Patient Instructions (Addendum)
Cellulitis Cellulitis is an infection of the skin and the tissue beneath it. The infected area is usually red and tender. Cellulitis occurs most often in the arms and lower legs.  CAUSES  Cellulitis is caused by bacteria that enter the skin through cracks or cuts in the skin. The most common types of bacteria that cause cellulitis are staphylococci and streptococci. SIGNS AND SYMPTOMS   Redness and warmth.  Swelling.  Tenderness or pain.  Fever. DIAGNOSIS  Your health care provider can usually determine what is wrong based on a physical exam. Blood tests may also be done. TREATMENT  Treatment usually involves taking an antibiotic medicine. HOME CARE INSTRUCTIONS   Take your antibiotic medicine as directed by your health care provider. Finish the antibiotic even if you start to feel better.  Keep the infected arm or leg elevated to reduce swelling.  Apply a warm cloth to the affected area up to 4 times per day to relieve pain.  Take medicines only as directed by your health care provider.  Keep all follow-up visits as directed by your health care provider. SEEK MEDICAL CARE IF:   You notice red streaks coming from the infected area.  Your red area gets larger or turns dark in color.  Your bone or joint underneath the infected area becomes painful after the skin has healed.  Your infection returns in the same area or another area.  You notice a swollen bump in the infected area.  You develop new symptoms.  You have a fever. SEEK IMMEDIATE MEDICAL CARE IF:   You feel very sleepy.  You develop vomiting or diarrhea.  You have a general ill feeling (malaise) with muscle aches and pains.   This information is not intended to replace advice given to you by your health care provider. Make sure you discuss any questions you have with your health care provider.   Document Released: 04/18/2005 Document Revised: 03/30/2015 Document Reviewed: 09/24/2011 Elsevier Interactive  Patient Education 2016 [employer].    Gout Gout is an inflammatory arthritis caused by a buildup of uric acid crystals in the joints. Uric acid is a chemical that is normally present in the blood. When the level of uric acid in the blood is too high it can form crystals that deposit in your joints and tissues. This causes joint redness, soreness, and swelling (inflammation). Repeat attacks are common. Over time, uric acid crystals can form into masses (tophi) near a joint, destroying bone and causing disfigurement. Gout is treatable and often preventable. CAUSES  The disease begins with elevated levels of uric acid in the blood. Uric acid is produced by your body when it breaks down a naturally found substance called purines. Certain foods you eat, such as meats and fish, contain high amounts of purines. Causes of an elevated uric acid level include:  Being passed down from parent to child (heredity).  Diseases that cause increased uric acid production (such as obesity, psoriasis, and certain cancers).  Excessive alcohol use.  Diet, especially diets rich in meat and seafood.  Medicines, including certain cancer-fighting medicines (chemotherapy), water pills (diuretics), and aspirin.  Chronic kidney disease. The kidneys are no longer able to remove uric acid well.  Problems with metabolism. Conditions strongly associated with gout include:  Obesity.  High blood pressure.  High cholesterol.  Diabetes. Not everyone with elevated uric acid levels gets gout. It is not understood why some people get gout and others do not. Surgery, joint injury, and eating too much  of certain foods are some of the factors that can lead to gout attacks. SYMPTOMS   An attack of gout comes on quickly. It causes intense pain with redness, swelling, and warmth in a joint.  Fever can occur.  Often, only one joint is involved. Certain joints are more commonly involved:  Base of the big  toe.  Knee.  Ankle.  Wrist.  Finger. Without treatment, an attack usually goes away in a few days to weeks. Between attacks, you usually will not have symptoms, which is different from many other forms of arthritis. DIAGNOSIS  Your caregiver will suspect gout based on your symptoms and exam. In some cases, tests may be recommended. The tests may include:  Blood tests.  Urine tests.  X-rays.  Joint fluid exam. This exam requires a needle to remove fluid from the joint (arthrocentesis). Using a microscope, gout is confirmed when uric acid crystals are seen in the joint fluid. TREATMENT  There are two phases to gout treatment: treating the sudden onset (acute) attack and preventing attacks (prophylaxis).  Treatment of an Acute Attack.  Medicines are used. These include anti-inflammatory medicines or steroid medicines.  An injection of steroid medicine into the affected joint is sometimes necessary.  The painful joint is rested. Movement can worsen the arthritis.  You may use warm or cold treatments on painful joints, depending which works best for you.  Treatment to Prevent Attacks.  If you suffer from frequent gout attacks, your caregiver may advise preventive medicine. These medicines are started after the acute attack subsides. These medicines either help your kidneys eliminate uric acid from your body or decrease your uric acid production. You may need to stay on these medicines for a very long time.  The early phase of treatment with preventive medicine can be associated with an increase in acute gout attacks. For this reason, during the first few months of treatment, your caregiver may also advise you to take medicines usually used for acute gout treatment. Be sure you understand your caregiver's directions. Your caregiver may make several adjustments to your medicine dose before these medicines are effective.  Discuss dietary treatment with your caregiver or dietitian.  Alcohol and drinks high in sugar and fructose and foods such as meat, poultry, and seafood can increase uric acid levels. Your caregiver or dietitian can advise you on drinks and foods that should be limited. HOME CARE INSTRUCTIONS   Do not take aspirin to relieve pain. This raises uric acid levels.  Only take over-the-counter or prescription medicines for pain, discomfort, or fever as directed by your caregiver.  Rest the joint as much as possible. When in bed, keep sheets and blankets off painful areas.  Keep the affected joint raised (elevated).  Apply warm or cold treatments to painful joints. Use of warm or cold treatments depends on which works best for you.  Use crutches if the painful joint is in your leg.  Drink enough fluids to keep your urine clear or pale yellow. This helps your body get rid of uric acid. Limit alcohol, sugary drinks, and fructose drinks.  Follow your dietary instructions. Pay careful attention to the amount of protein you eat. Your daily diet should emphasize fruits, vegetables, whole grains, and fat-free or low-fat milk products. Discuss the use of coffee, vitamin C, and cherries with your caregiver or dietitian. These may be helpful in lowering uric acid levels.  Maintain a healthy body weight. SEEK MEDICAL CARE IF:   You develop diarrhea, vomiting, or  any side effects from medicines.  You do not feel better in 24 hours, or you are getting worse. SEEK IMMEDIATE MEDICAL CARE IF:   Your joint becomes suddenly more tender, and you have chills or a fever. MAKE SURE YOU:   Understand these instructions.  Will watch your condition.  Will get help right away if you are not doing well or get worse.   This information is not intended to replace advice given to you by your health care provider. Make sure you discuss any questions you have with your health care provider.   Document Released: 07/06/2000 Document Revised: 07/30/2014 Document Reviewed:  02/20/2012 Elsevier Interactive Patient Education [employer].

## 2015-09-26 LAB — URINE CULTURE: Colony Count: >=100000

## 2015-09-28 ENCOUNTER — Ambulatory Visit (INDEPENDENT_AMBULATORY_CARE_PROVIDER_SITE_OTHER): Payer: Medicare Other | Admitting: Urgent Care

## 2015-09-28 ENCOUNTER — Telehealth: Payer: Self-pay | Admitting: Urgent Care

## 2015-09-28 VITALS — BP 132/80 | HR 90 | Temp 97.4°F | Resp 18 | Ht 72.0 in | Wt 324.4 lb

## 2015-09-28 DIAGNOSIS — M109 Gout, unspecified: Secondary | ICD-10-CM

## 2015-09-28 DIAGNOSIS — N183 Chronic kidney disease, stage 3 unspecified: Secondary | ICD-10-CM

## 2015-09-28 DIAGNOSIS — M10071 Idiopathic gout, right ankle and foot: Secondary | ICD-10-CM | POA: Diagnosis not present

## 2015-09-28 DIAGNOSIS — M25471 Effusion, right ankle: Secondary | ICD-10-CM

## 2015-09-28 DIAGNOSIS — I10 Essential (primary) hypertension: Secondary | ICD-10-CM

## 2015-09-28 DIAGNOSIS — M25571 Pain in right ankle and joints of right foot: Secondary | ICD-10-CM

## 2015-09-28 MED ORDER — CIPROFLOXACIN HCL 500 MG PO TABS: 500.0000 mg | ORAL_TABLET | Freq: Two times a day (BID) | ORAL | Status: DC

## 2015-09-28 MED ORDER — PREDNISONE 10 MG PO TABS: ORAL_TABLET | ORAL | Status: DC

## 2015-09-28 NOTE — Telephone Encounter (Signed)
GFR was 30. Will start patient on Cipro 500mg  BID x10 days. Patient is aware.

## 2015-09-28 NOTE — Patient Instructions (Signed)

## 2015-09-28 NOTE — Progress Notes (Signed)
    MRN: [id number] DOB: 07-19-1951  Subjective:   Alex Williams is a 65 y.o. male presenting for follow up on cellulitis and gout. Patient was started on prednisone at his last visit, advised to continue doxycycline. Today, patient reports that he has had significant improvement in his ankle pain, swelling, redness since starting steroid course. He has tried to keep his foot elevated but admits that he could not yesterday. Denies fever, trauma.  Alex Williams has a current medication list which includes the following prescription(s): amlodipine, aspirin, cetirizine, cholecalciferol, doxycycline, fish oil-cholecalciferol, losartan, multiple vitamins-minerals, prednisone, rosuvastatin, tramadol, tramadol, vitamin c, and allopurinol. Also is allergic to codeine phosphate.  Alex Williams  has a past medical history of COLONIC POLYPS, HX OF (06/27/2007); EXOGENOUS OBESITY (01/30/2010); GOUT (01/30/2010); HYPERCHOLESTEROLEMIA (06/30/2007); HYPERTENSION (06/27/2007); LOW BACK PAIN (06/27/2007); NEPHROLITHIASIS, HX OF (06/27/2007); OSTEOARTHRITIS (06/27/2007); SLEEP APNEA, OBSTRUCTIVE, MODERATE (01/27/2009); and Cervical disc disease. Also  has past surgical history that includes Lumbar laminectomy; Total knee arthroplasty; Cardiac catheterization; and Radiofrequency ablation kidney.  Objective:   Vitals: BP 132/80 mmHg  Pulse 90  Temp(Src) 97.4 F (36.3 C) (Oral)  Resp 18  Ht 6' (1.829 m)  Wt 324 lb 6.4 oz (147.147 kg)  BMI 43.99 kg/m2  SpO2 98%  Physical Exam  Constitutional: He is oriented to person, place, and time. He appears well-developed and well-nourished.  Cardiovascular: Normal rate.   Pulmonary/Chest: Effort normal.  Musculoskeletal:       Right ankle: He exhibits swelling (right ankle still measures 29cm). He exhibits normal range of motion, no ecchymosis, no deformity and no laceration. Tenderness. Medial malleolus tenderness found. No lateral malleolus tenderness found. Achilles tendon exhibits no pain and  no defect.  Neurological: He is alert and oriented to person, place, and time.  Skin: Skin is warm and dry.    Assessment and Plan :   1. Acute gout of right ankle, unspecified cause 2. Right ankle pain 3. Right ankle swelling - Significant improvement. Will start taper of prednisone, rtc in 1 week.   4. Essential hypertension 5. Chronic kidney disease, stage 3 (moderate) - Stable  Jaynee Eagles, PA-C [facility] [phone] 09/28/2015 8:23 AM

## 2015-10-03 ENCOUNTER — Ambulatory Visit: Payer: Medicare Other | Admitting: Internal Medicine

## 2015-10-06 ENCOUNTER — Ambulatory Visit (INDEPENDENT_AMBULATORY_CARE_PROVIDER_SITE_OTHER): Payer: Medicare Other | Admitting: Urgent Care

## 2015-10-06 ENCOUNTER — Encounter: Payer: Self-pay | Admitting: Urgent Care

## 2015-10-06 VITALS — BP 140/80 | HR 84 | Temp 98.0°F | Resp 18 | Ht 72.0 in | Wt 325.0 lb

## 2015-10-06 DIAGNOSIS — M10071 Idiopathic gout, right ankle and foot: Secondary | ICD-10-CM | POA: Diagnosis not present

## 2015-10-06 DIAGNOSIS — M109 Gout, unspecified: Secondary | ICD-10-CM

## 2015-10-06 DIAGNOSIS — N3090 Cystitis, unspecified without hematuria: Secondary | ICD-10-CM

## 2015-10-06 LAB — POCT URINALYSIS DIP (MANUAL ENTRY)
Bilirubin, UA: NEGATIVE
GLUCOSE UA: NEGATIVE
Ketones, POC UA: NEGATIVE
LEUKOCYTES UA: NEGATIVE
NITRITE UA: NEGATIVE
Protein Ur, POC: >=300 — AB
Spec Grav, UA: 1.025
UROBILINOGEN UA: 0.2
pH, UA: 5.5

## 2015-10-06 LAB — POC MICROSCOPIC URINALYSIS (UMFC): Mucus: ABSENT

## 2015-10-06 NOTE — Progress Notes (Signed)
MRN: [id number] DOB: 05/09/1951  Subjective:   Alex Williams is a 65 y.o. male presenting for Follow-up  Patient is presenting for f/u on gout flare of his right ankle. He was initially seen on 09/23/2015, started on doxycycline for cellulitis due to erythema, warmth, high wbc. He had close f/u and was started on prednisone to cover for gout given minimal improvement with doxycycline and his chronic kidney disease. His last office visit was on 09/28/2015, had significant improvement at that time. Patient also had a course of Cipro for UTI which was started on 09/28/2015. Today, he reports he is doing much better. He is walking without any pain, moving his foot as he would normally. Denies redness, warmth, swelling. He has 3 days left of ciprofloxacin, states that he is urinating frequently but denies dysuria, frank hematuria, fever, n/v, belly pain. Of note, patient will f/u with his PCP next week. He plans on obtaining referral to nephrology at that point.  Alex Williams has a current medication list which includes the following prescription(s): amlodipine, aspirin, cetirizine, cholecalciferol, ciprofloxacin, fish oil-cholecalciferol, losartan, multiple vitamins-minerals, prednisone, rosuvastatin, tramadol, vitamin c, allopurinol, doxycycline, and tramadol. Also is allergic to codeine phosphate.  Alex Williams  has a past medical history of COLONIC POLYPS, HX OF (06/27/2007); EXOGENOUS OBESITY (01/30/2010); GOUT (01/30/2010); HYPERCHOLESTEROLEMIA (06/30/2007); HYPERTENSION (06/27/2007); LOW BACK PAIN (06/27/2007); NEPHROLITHIASIS, HX OF (06/27/2007); OSTEOARTHRITIS (06/27/2007); SLEEP APNEA, OBSTRUCTIVE, MODERATE (01/27/2009); and Cervical disc disease. Also  has past surgical history that includes Lumbar laminectomy; Total knee arthroplasty; Cardiac catheterization; and Radiofrequency ablation kidney.  Objective:   Vitals: BP 140/80 mmHg  Pulse 84  Temp(Src) 98 F (36.7 C) (Oral)  Resp 18  Ht 6' (1.829 m)  Wt 325  lb (147.419 kg)  BMI 44.07 kg/m2  SpO2 98%  Physical Exam  Constitutional: He is oriented to person, place, and time. He appears well-developed and well-nourished.  Cardiovascular: Normal rate.   Pulmonary/Chest: Effort normal.  Musculoskeletal:       Right ankle: He exhibits normal range of motion, no swelling, no ecchymosis, no deformity and no laceration. No tenderness. Achilles tendon exhibits no pain and no defect.  Neurological: He is alert and oriented to person, place, and time.  Skin: Skin is warm and dry. No rash noted. No erythema. No pallor.   Results for orders placed or performed in visit on 10/06/15 (from the past 24 hour(s))  POCT urinalysis dipstick     Status: Abnormal   Collection Time: 10/06/15  8:55 AM  Result Value Ref Range   Color, UA yellow yellow   Clarity, UA clear clear   Glucose, UA negative negative   Bilirubin, UA negative negative   Ketones, POC UA negative negative   Spec Grav, UA 1.025    Blood, UA trace-lysed (A) negative   pH, UA 5.5    Protein Ur, POC >=300 (A) negative   Urobilinogen, UA 0.2    Nitrite, UA Negative Negative   Leukocytes, UA Negative Negative  POCT Microscopic Urinalysis ([facility])     Status: Abnormal   Collection Time: 10/06/15  8:55 AM  Result Value Ref Range   WBC,UR,HPF,POC Too numerous to count  (A) None WBC/hpf   RBC,UR,HPF,POC None None RBC/hpf   Bacteria None None, Too numerous to count   Mucus Absent Absent   Epithelial Cells, UR Per Microscopy Few (A) None, Too numerous to count cells/hpf   Assessment and Plan :   1. Acute gout of right ankle, unspecified cause - Resolved, continue  f/u with PCP. Discuss with PCP whether or not he should restart Allopurinol.  2. Cystitis - Continue ciprofloxacin and make sure appt gets set up for f/u with his nephrologist due to his CKD, proteinuria, hematuria, wbc. Urine culture pending.  Jaynee Eagles, PA-C [facility] [phone] 10/06/2015 8:28 AM

## 2015-10-06 NOTE — Patient Instructions (Signed)

## 2015-10-07 LAB — URINE CULTURE
COLONY COUNT: NO GROWTH
ORGANISM ID, BACTERIA: NO GROWTH

## 2015-10-11 ENCOUNTER — Encounter: Payer: Self-pay | Admitting: Internal Medicine

## 2015-10-11 ENCOUNTER — Ambulatory Visit (INDEPENDENT_AMBULATORY_CARE_PROVIDER_SITE_OTHER): Payer: Medicare Other | Admitting: Internal Medicine

## 2015-10-11 VITALS — BP 146/90 | HR 92 | Temp 98.2°F | Resp 20 | Ht 72.0 in | Wt 330.0 lb

## 2015-10-11 DIAGNOSIS — G4733 Obstructive sleep apnea (adult) (pediatric): Secondary | ICD-10-CM | POA: Diagnosis not present

## 2015-10-11 DIAGNOSIS — E7800 Pure hypercholesterolemia, unspecified: Secondary | ICD-10-CM

## 2015-10-11 DIAGNOSIS — Z23 Encounter for immunization: Secondary | ICD-10-CM | POA: Diagnosis not present

## 2015-10-11 DIAGNOSIS — I10 Essential (primary) hypertension: Secondary | ICD-10-CM | POA: Diagnosis not present

## 2015-10-11 MED ORDER — ALPRAZOLAM 1 MG PO TABS: 1.0000 mg | ORAL_TABLET | Freq: Every evening | ORAL | Status: DC | PRN

## 2015-10-11 NOTE — Progress Notes (Signed)
Pre visit review using our clinic review tool, if applicable. No additional management support is needed unless otherwise documented below in the visit note. 

## 2015-10-11 NOTE — Patient Instructions (Signed)
Resume allopurinol in 3 weeks  Limit your sodium (Salt) intake  Please check your blood pressure on a regular basis.  If it is consistently greater than 150/90, please make an office appointment.    It is important that you exercise regularly, at least 20 minutes 3 to 4 times per week.  If you develop chest pain or shortness of breath seek  medical attention.  You need to lose weight.  Consider a lower calorie diet and regular exercise.

## 2015-10-11 NOTE — Progress Notes (Signed)
Subjective:    Patient ID: Alex Williams, male    DOB: 04/15/1951, 65 y.o.   MRN: [id number]  HPI  65 year old patient who has a history of essential hypertension and OSA.  He has treated dyslipidemia. He is seen today in follow-up after treatment for acute gout involving his right ankle.  He had been on low-dose allopurinol that was discontinued.  He has completed prednisone and has little or no pain involving the right ankle.  He does have some mild residual swelling.  He has returned to work.  He is scheduled for an MRI involving his kidney due to a renal mass.  Is requesting Xanax to assist with the claustrophobia   Otherwise, doing well.  Past Medical History  Diagnosis Date  . COLONIC POLYPS, HX OF 06/27/2007  . EXOGENOUS OBESITY 01/30/2010  . GOUT 01/30/2010  . HYPERCHOLESTEROLEMIA 06/30/2007  . HYPERTENSION 06/27/2007  . LOW BACK PAIN 06/27/2007  . NEPHROLITHIASIS, HX OF 06/27/2007  . OSTEOARTHRITIS 06/27/2007  . SLEEP APNEA, OBSTRUCTIVE, MODERATE 01/27/2009  . Cervical disc disease     Social History   Social History  . Marital Status: Married    Spouse Name: N/A  . Number of Children: N/A  . Years of Education: N/A   Occupational History  . Not on file.   Social History Main Topics  . Smoking status: Former Smoker    Quit date: 07/23/1980  . Smokeless tobacco: Never Used  . Alcohol Use: Yes  . Drug Use: No  . Sexual Activity: Not on file   Other Topics Concern  . Not on file   Social History Narrative    Past Surgical History  Procedure Laterality Date  . Lumbar laminectomy    . Total knee arthroplasty      left  . Cardiac catheterization    . Radiofrequency ablation kidney      Family History  Problem Relation Age of Onset  . Diabetes Neg Hx     family sisters x6 hx of  . Hypertension Other   . Sleep apnea Other     Allergies  Allergen Reactions  . Codeine Phosphate     Current Outpatient Prescriptions on File Prior to Visit  Medication Sig  Dispense Refill  . amLODipine (NORVASC) 10 MG tablet Take 1 tablet (10 mg total) by mouth daily. 90 tablet 1  . aspirin 81 MG tablet Take 81 mg by mouth daily.      . cetirizine (ZYRTEC) 10 MG tablet Take 10 mg by mouth daily. Reported on 08/07/2015    . Cholecalciferol (VITAMIN D-3 PO) Take by mouth.    . Fish Oil-Cholecalciferol (OMEGA-3 FISH OIL/VITAMIN D3 PO) Take by mouth daily.      Marland Kitchen losartan (COZAAR) 100 MG tablet Take 1 tablet (100 mg total) by mouth daily. 90 tablet 3  . Multiple Vitamins-Minerals (ONE-A-DAY MENS 50+ ADVANTAGE PO) Take 1 tablet by mouth daily.    . rosuvastatin (CRESTOR) 5 MG tablet Take 1 tablet (5 mg total) by mouth daily. 90 tablet 2  . vitamin C (ASCORBIC ACID) 500 MG tablet Take 500 mg by mouth daily.     Marland Kitchen allopurinol (ZYLOPRIM) 100 MG tablet Take 1 tablet (100 mg total) by mouth daily. (Patient not taking: Reported on 10/11/2015) 90 tablet 6   No current facility-administered medications on file prior to visit.    BP 146/90 mmHg  Pulse 92  Temp(Src) 98.2 F (36.8 C) (Oral)  Resp 20  Ht 6' (1.829 m)  Wt 330 lb (149.687 kg)  BMI 44.75 kg/m2  SpO2 98%      Review of Systems  Constitutional: Negative for fever, chills, appetite change and fatigue.  HENT: Negative for congestion, dental problem, ear pain, hearing loss, sore throat, tinnitus, trouble swallowing and voice change.   Eyes: Negative for pain, discharge and visual disturbance.  Respiratory: Negative for cough, chest tightness, wheezing and stridor.   Cardiovascular: Negative for chest pain, palpitations and leg swelling.  Gastrointestinal: Negative for nausea, vomiting, abdominal pain, diarrhea, constipation, blood in stool and abdominal distention.  Genitourinary: Negative for urgency, hematuria, flank pain, discharge, difficulty urinating and genital sores.  Musculoskeletal: Negative for myalgias, back pain, joint swelling, arthralgias, gait problem and neck stiffness.       Right ankle  pain and swelling  Skin: Negative for rash.  Neurological: Negative for dizziness, syncope, speech difficulty, weakness, numbness and headaches.  Hematological: Negative for adenopathy. Does not bruise/bleed easily.  Psychiatric/Behavioral: Negative for behavioral problems and dysphoric mood. The patient is not nervous/anxious.        Objective:   Physical Exam  Constitutional: He appears well-developed and well-nourished. No distress.  Blood pressure 136/84 Weight 3:30  Cardiovascular: Normal rate and regular rhythm.   Pulmonary/Chest: Effort normal and breath sounds normal. No respiratory distress.  Musculoskeletal:  Minimal soft tissue swelling about the right ankle.  No erythema, tenderness          Assessment & Plan:   Resolving acute gouty arthritis, right ankle.  The patient's allopurinol has been discontinued.  We'll resume in 3 weeks Hypertension, stable Dyslipidemia Obesity History of renal mass.  Patient was given a prescription for Xanax to take 30-60 minutes prior to his procedure  CPX 3 months

## 2015-10-22 ENCOUNTER — Other Ambulatory Visit: Payer: Self-pay | Admitting: Urgent Care

## 2015-10-23 ENCOUNTER — Other Ambulatory Visit: Payer: Self-pay | Admitting: Internal Medicine

## 2015-12-10 ENCOUNTER — Ambulatory Visit
Admission: RE | Admit: 2015-12-10 | Discharge: 2015-12-10 | Disposition: A | Discharge status: Home / Self Care | Payer: Medicare Other | Source: Ambulatory Visit | Attending: Urology | Admitting: Urology

## 2015-12-10 DIAGNOSIS — N281 Cyst of kidney, acquired: Secondary | ICD-10-CM | POA: Diagnosis not present

## 2016-01-02 ENCOUNTER — Ambulatory Visit (INDEPENDENT_AMBULATORY_CARE_PROVIDER_SITE_OTHER): Payer: Medicare Other

## 2016-01-02 ENCOUNTER — Ambulatory Visit (INDEPENDENT_AMBULATORY_CARE_PROVIDER_SITE_OTHER): Payer: Medicare Other | Admitting: Physician Assistant

## 2016-01-02 VITALS — BP 154/94 | HR 90 | Temp 97.7°F | Resp 17 | Ht 72.0 in | Wt 342.0 lb

## 2016-01-02 DIAGNOSIS — M25561 Pain in right knee: Secondary | ICD-10-CM | POA: Diagnosis not present

## 2016-01-02 DIAGNOSIS — M179 Osteoarthritis of knee, unspecified: Secondary | ICD-10-CM | POA: Diagnosis not present

## 2016-01-02 LAB — POCT CBC
Granulocyte percent: 61.5 %G (ref 37–80)
HCT, POC: 35.6 % — AB (ref 43.5–53.7)
Hemoglobin: 12.1 g/dL — AB (ref 14.1–18.1)
LYMPH, POC: 3.2 (ref 0.6–3.4)
MCH, POC: 25.7 pg — AB (ref 27–31.2)
MCHC: 34 g/dL (ref 31.8–35.4)
MCV: 75.5 fL — AB (ref 80–97)
MID (cbc): 0.7 (ref 0–0.9)
MPV: 6.8 fL (ref 0–99.8)
PLATELET COUNT, POC: 231 10*3/uL (ref 142–424)
POC Granulocyte: 6.2 (ref 2–6.9)
POC LYMPH %: 31.3 % (ref 10–50)
POC MID %: 7.3 %M (ref 0–12)
RBC: 4.71 M/uL (ref 4.69–6.13)
RDW, POC: 17.2 %
WBC: 10.1 10*3/uL (ref 4.6–10.2)

## 2016-01-02 MED ORDER — MELOXICAM 15 MG PO TABS: 7.5000 mg | ORAL_TABLET | Freq: Every day | ORAL | Status: DC

## 2016-01-02 NOTE — Progress Notes (Signed)
[facility] [address] [place] [address] [phone]  Date:  01/02/2016   Name:  Alex Williams   DOB:  19-Nov-1950   MRN:  [id number]  PCP:  Nyoka Cowden, MD    History of Present Illness:  Alex Williams is a 65 y.o. male patient who presents to [facility] for cc of right knee pain.    2 weeks ago pain of right knee, that seemed to be improving.  However 3 days ago, he was cornering around his garage, when he felt an intense pain at his right lower part of knee.  No radiating pain, swelling or redness.  He has not noticed any warmth.  He has done nothing for pain.  Hx of left knee replacement, 10 years ago.   Prior injuries, though not dxd.     Patient Active Problem List   Diagnosis Date Noted  . Chronic kidney disease 12/10/2011  . GOUT 01/30/2010  . Obesity 01/30/2010  . Obstructive sleep apnea 01/27/2009  . HYPERCHOLESTEROLEMIA 06/30/2007  . Essential hypertension 06/27/2007  . Osteoarthritis 06/27/2007  . LOW BACK PAIN 06/27/2007  . History of colonic polyps 06/27/2007  . NEPHROLITHIASIS, HX OF 06/27/2007    Past Medical History  Diagnosis Date  . COLONIC POLYPS, HX OF 06/27/2007  . EXOGENOUS OBESITY 01/30/2010  . GOUT 01/30/2010  . HYPERCHOLESTEROLEMIA 06/30/2007  . HYPERTENSION 06/27/2007  . LOW BACK PAIN 06/27/2007  . NEPHROLITHIASIS, HX OF 06/27/2007  . OSTEOARTHRITIS 06/27/2007  . SLEEP APNEA, OBSTRUCTIVE, MODERATE 01/27/2009  . Cervical disc disease     Past Surgical History  Procedure Laterality Date  . Lumbar laminectomy    . Total knee arthroplasty      left  . Cardiac catheterization    . Radiofrequency ablation kidney      Social History  Substance Use Topics  . Smoking status: Former Smoker    Quit date: 07/23/1980  . Smokeless tobacco: Never Used  . Alcohol Use: Yes    Family History  Problem Relation Age of Onset  . Diabetes Neg Hx     family sisters x6 hx of  . Hypertension Other   . Sleep apnea Other      Allergies  Allergen Reactions  . Codeine Phosphate     Medication list has been reviewed and updated.  Current Outpatient Prescriptions on File Prior to Visit  Medication Sig Dispense Refill  . allopurinol (ZYLOPRIM) 100 MG tablet Take 1 tablet (100 mg total) by mouth daily. 90 tablet 6  . ALPRAZolam (XANAX) 1 MG tablet Take 1 tablet (1 mg total) by mouth at bedtime as needed for anxiety. 30 tablet 0  . amLODipine (NORVASC) 10 MG tablet Take 1 tablet (10 mg total) by mouth daily. 90 tablet 1  . aspirin 81 MG tablet Take 81 mg by mouth daily.      . cetirizine (ZYRTEC) 10 MG tablet Take 10 mg by mouth daily. Reported on 08/07/2015    . losartan (COZAAR) 100 MG tablet Take 1 tablet (100 mg total) by mouth daily. 90 tablet 3  . Multiple Vitamins-Minerals (ONE-A-DAY MENS 50+ ADVANTAGE PO) Take 1 tablet by mouth daily.    . rosuvastatin (CRESTOR) 5 MG tablet Take 1 tablet (5 mg total) by mouth daily. 90 tablet 2  . traMADol (ULTRAM) 50 MG tablet TAKE 1 TABLET BY MOUTH EVERY 8 HOURS AS NEEDED 30 tablet 2   No current facility-administered medications on file prior to visit.  ROS ROS otherwise unremarkable unless listed above.   Physical Examination: BP 154/94 mmHg  Pulse 90  Temp(Src) 97.7 F (36.5 C) (Oral)  Resp 17  Ht 6' (1.829 m)  Wt 342 lb (155.13 kg)  BMI 46.37 kg/m2  SpO2 98% Ideal Body Weight: Weight in (lb) to have BMI = 25: 183.9  Physical Exam  Constitutional: He is oriented to person, place, and time. He appears well-developed and well-nourished. No distress.  HENT:  Head: Normocephalic and atraumatic.  Eyes: Conjunctivae and EOM are normal. Pupils are equal, round, and reactive to light.  Cardiovascular: Normal rate and regular rhythm.  Exam reveals no friction rub.   No murmur heard. Pulmonary/Chest: Effort normal. No respiratory distress. He has no wheezes.  Musculoskeletal:  Right knee: No erythema.  There is slight warmth anteriorly.  He is very tender  just inferior to patella  tendon at the inferior bursa.        Neurological: He is alert and oriented to person, place, and time.  Skin: Skin is warm and dry. He is not diaphoretic.  Psychiatric: He has a normal mood and affect. His behavior is normal.   Results for orders placed or performed in visit on 01/02/16  POCT CBC  Result Value Ref Range   WBC 10.1 4.6 - 10.2 K/uL   Lymph, poc 3.2 0.6 - 3.4   POC LYMPH PERCENT 31.3 10 - 50 %L   MID (cbc) 0.7 0 - 0.9   POC MID % 7.3 0 - 12 %M   POC Granulocyte 6.2 2 - 6.9   Granulocyte percent 61.5 37 - 80 %G   RBC 4.71 4.69 - 6.13 M/uL   Hemoglobin 12.1 (A) 14.1 - 18.1 g/dL   HCT, POC 35.6 (A) 43.5 - 53.7 %   MCV 75.5 (A) 80 - 97 fL   MCH, POC 25.7 (A) 27 - 31.2 pg   MCHC 34.0 31.8 - 35.4 g/dL   RDW, POC 17.2 %   Platelet Count, POC 231 142 - 424 K/uL   MPV 6.8 0 - 99.8 fL   Dg Knee Complete 4 Views Right  01/02/2016  CLINICAL DATA: Knee pain. No reported injury. EXAM: RIGHT KNEE - COMPLETE 4+ VIEW COMPARISON:  03/28/2014. FINDINGS: Tricompartment prominent degenerative change noted. Degenerative changes most severe about patellofemoral and medial compartments. Chondrocalcinosis noted. No evidence of fracture or dislocation. IMPRESSION: Prominent degenerative changes right knee. No acute abnormality identified. Electronically Signed   By: Marcello Moores  Register   On: 01/02/2016 11:17     Assessment and Plan: Alex Williams is a 65 y.o. male who is here today for chief complaint of right knee pain. --septic bursitis, tendonitis, inflammatory arthritis. --advised mobic .5-1 full tablet.  Precautionary use advised.  Also advised ice 3 times per day for 15 minutes.  Right knee pain - Plan: DG Knee Complete 4 Views Right, POCT CBC, meloxicam (MOBIC) 15 MG tablet  Ivar Drape, PA-C [facility] 01/02/2016 10:08 AM

## 2016-01-02 NOTE — Patient Instructions (Addendum)
     IF you received an x-ray today, you will receive an invoice from [facility]. Please contact [facility] at [phone] with questions or concerns regarding your invoice.   IF you received labwork today, you will receive an invoice from [employer]. Please contact [facility] at [phone] with questions or concerns regarding your invoice.   Our billing staff will not be able to assist you with questions regarding bills from these companies.  You will be contacted with the lab results as soon as they are available. The fastest way to get your results is to activate your My Chart account. Instructions are located on the last page of this paperwork. If you have not heard from [place] regarding the results in 2 weeks, please contact this office.    Please ice the knee three times per day.  I would like you to take the anti-inflammatory at this time.  Please use the mobic without use of the naproxen or ibuprofen.  You are able to use tylenol.   Knee Pain Knee pain is a common problem. It can have many causes. The pain often goes away by following your doctor's home care instructions. Treatment for ongoing pain will depend on the cause of your pain. If your knee pain continues, more tests may be needed to diagnose your condition. Tests may include X-rays or other imaging studies of your knee. HOME CARE  Take medicines only as told by your doctor.  Rest your knee and keep it raised (elevated) while you are resting.  Do not do things that cause pain or make your pain worse.  Avoid activities where both feet leave the ground at the same time, such as running, jumping rope, or doing jumping jacks.  Apply ice to the knee area:  Put ice in a plastic bag.  Place a towel between your skin and the bag.  Leave the ice on for 20 minutes, 2-3 times a day.  Ask your doctor if you should wear an elastic knee support.  Sleep with a pillow under your  knee.  Lose weight if you are overweight. Being overweight can make your knee hurt more.  Do not use any tobacco products, including cigarettes, chewing tobacco, or electronic cigarettes. If you need help quitting, ask your doctor. Smoking may slow the healing of any bone and joint problems that you may have. GET HELP IF:  Your knee pain does not stop, it changes, or it gets worse.  You have a fever along with knee pain.  Your knee gives out or locks up.  Your knee becomes more swollen. GET HELP RIGHT AWAY IF:   Your knee feels hot to the touch.  You have chest pain or trouble breathing.   This information is not intended to replace advice given to you by your health care provider. Make sure you discuss any questions you have with your health care provider.   Document Released: 10/05/2008 Document Revised: 07/30/2014 Document Reviewed: 09/09/2013 Elsevier Interactive Patient Education [employer].

## 2016-01-09 DIAGNOSIS — N281 Cyst of kidney, acquired: Secondary | ICD-10-CM | POA: Diagnosis not present

## 2016-01-09 DIAGNOSIS — N289 Disorder of kidney and ureter, unspecified: Secondary | ICD-10-CM | POA: Diagnosis not present

## 2016-01-09 DIAGNOSIS — N183 Chronic kidney disease, stage 3 (moderate): Secondary | ICD-10-CM | POA: Diagnosis not present

## 2016-01-26 DIAGNOSIS — N2889 Other specified disorders of kidney and ureter: Secondary | ICD-10-CM | POA: Diagnosis not present

## 2016-02-24 ENCOUNTER — Other Ambulatory Visit: Payer: Medicare Other

## 2016-03-01 ENCOUNTER — Other Ambulatory Visit (INDEPENDENT_AMBULATORY_CARE_PROVIDER_SITE_OTHER): Payer: Medicare Other

## 2016-03-01 DIAGNOSIS — Z0000 Encounter for general adult medical examination without abnormal findings: Secondary | ICD-10-CM

## 2016-03-01 LAB — CBC WITH DIFFERENTIAL/PLATELET
BASOS ABS: 0 10*3/uL (ref 0.0–0.1)
BASOS PCT: 0.5 % (ref 0.0–3.0)
EOS ABS: 0.3 10*3/uL (ref 0.0–0.7)
Eosinophils Relative: 3.7 % (ref 0.0–5.0)
HCT: 34.1 % — ABNORMAL LOW (ref 39.0–52.0)
Hemoglobin: 11.3 g/dL — ABNORMAL LOW (ref 13.0–17.0)
LYMPHS ABS: 2.3 10*3/uL (ref 0.7–4.0)
Lymphocytes Relative: 31.1 % (ref 12.0–46.0)
MCHC: 33.1 g/dL (ref 30.0–36.0)
MCV: 77.8 fl — ABNORMAL LOW (ref 78.0–100.0)
Monocytes Absolute: 0.7 10*3/uL (ref 0.1–1.0)
Monocytes Relative: 8.7 % (ref 3.0–12.0)
NEUTROS ABS: 4.2 10*3/uL (ref 1.4–7.7)
NEUTROS PCT: 56 % (ref 43.0–77.0)
PLATELETS: 229 10*3/uL (ref 150.0–400.0)
RBC: 4.39 Mil/uL (ref 4.22–5.81)
RDW: 15.8 % — AB (ref 11.5–15.5)
WBC: 7.5 10*3/uL (ref 4.0–10.5)

## 2016-03-01 LAB — TSH: TSH: 2.05 u[IU]/mL (ref 0.35–4.50)

## 2016-03-01 LAB — HEPATIC FUNCTION PANEL
ALBUMIN: 3.7 g/dL (ref 3.5–5.2)
ALK PHOS: 92 U/L (ref 39–117)
ALT: 14 U/L (ref 0–53)
AST: 14 U/L (ref 0–37)
BILIRUBIN DIRECT: 0.1 mg/dL (ref 0.0–0.3)
TOTAL PROTEIN: 6.2 g/dL (ref 6.0–8.3)
Total Bilirubin: 0.7 mg/dL (ref 0.2–1.2)

## 2016-03-01 LAB — LIPID PANEL
CHOL/HDL RATIO: 5
Cholesterol: 166 mg/dL (ref 0–200)
HDL: 32.6 mg/dL — ABNORMAL LOW (ref >39.00)
LDL Cholesterol: 105 mg/dL — ABNORMAL HIGH (ref 0–99)
NONHDL: 132.99
Triglycerides: 138 mg/dL (ref 0.0–149.0)
VLDL: 27.6 mg/dL (ref 0.0–40.0)

## 2016-03-01 LAB — BASIC METABOLIC PANEL
BUN: 47 mg/dL — AB (ref 6–23)
CALCIUM: 9.5 mg/dL (ref 8.4–10.5)
CHLORIDE: 112 meq/L (ref 96–112)
CO2: 22 meq/L (ref 19–32)
CREATININE: 2.47 mg/dL — AB (ref 0.40–1.50)
GFR: 27.98 mL/min — ABNORMAL LOW (ref >60.00)
GLUCOSE: 97 mg/dL (ref 70–99)
Potassium: 5.5 mEq/L — ABNORMAL HIGH (ref 3.5–5.1)
Sodium: 142 mEq/L (ref 135–145)

## 2016-03-01 LAB — PSA: PSA: 1.28 ng/mL (ref 0.10–4.00)

## 2016-03-01 LAB — POC URINALSYSI DIPSTICK (AUTOMATED)
Bilirubin, UA: NEGATIVE
Glucose, UA: NEGATIVE
KETONES UA: NEGATIVE
Leukocytes, UA: NEGATIVE
Nitrite, UA: NEGATIVE
PH UA: 5
SPEC GRAV UA: 1.02
Urobilinogen, UA: 0.2

## 2016-03-02 ENCOUNTER — Encounter: Payer: Self-pay | Admitting: Internal Medicine

## 2016-03-02 ENCOUNTER — Ambulatory Visit (INDEPENDENT_AMBULATORY_CARE_PROVIDER_SITE_OTHER): Payer: Medicare Other | Admitting: Internal Medicine

## 2016-03-02 VITALS — BP 160/88 | HR 94 | Temp 97.9°F | Ht 70.5 in | Wt 340.0 lb

## 2016-03-02 DIAGNOSIS — M150 Primary generalized (osteo)arthritis: Secondary | ICD-10-CM

## 2016-03-02 DIAGNOSIS — Z0000 Encounter for general adult medical examination without abnormal findings: Secondary | ICD-10-CM

## 2016-03-02 DIAGNOSIS — I10 Essential (primary) hypertension: Secondary | ICD-10-CM

## 2016-03-02 DIAGNOSIS — E7800 Pure hypercholesterolemia, unspecified: Secondary | ICD-10-CM

## 2016-03-02 DIAGNOSIS — G4733 Obstructive sleep apnea (adult) (pediatric): Secondary | ICD-10-CM | POA: Diagnosis not present

## 2016-03-02 DIAGNOSIS — M159 Polyosteoarthritis, unspecified: Secondary | ICD-10-CM

## 2016-03-02 DIAGNOSIS — Z86010 Personal history of colonic polyps: Secondary | ICD-10-CM

## 2016-03-02 DIAGNOSIS — N184 Chronic kidney disease, stage 4 (severe): Secondary | ICD-10-CM

## 2016-03-02 NOTE — Progress Notes (Signed)
Subjective:    Patient ID: Alex Williams, male    DOB: [birth year], 66 y.o.   MRN: [id number]  HPI   69 -year-old patient who is seen today for a wellness exam.  Last colonoscopy 2012  He has a history of gout than has been prescribed anti-inflammatory medication sporadically at the urgent care and by orthopedics.  He has a history of chronic kidney disease and attempts have been made to avoid anti-inflammatory medications   Past Medical History:  Diagnosis Date  . Cervical disc disease   . COLONIC POLYPS, HX OF 06/27/2007  . EXOGENOUS OBESITY 01/30/2010  . GOUT 01/30/2010  . HYPERCHOLESTEROLEMIA 06/30/2007  . HYPERTENSION 06/27/2007  . LOW BACK PAIN 06/27/2007  . NEPHROLITHIASIS, HX OF 06/27/2007  . OSTEOARTHRITIS 06/27/2007  . SLEEP APNEA, OBSTRUCTIVE, MODERATE 01/27/2009    Social History   Social History  . Marital status: Married    Spouse name: N/A  . Number of children: N/A  . Years of education: N/A   Occupational History  . Not on file.   Social History Main Topics  . Smoking status: Former Smoker    Quit date: 07/23/1980  . Smokeless tobacco: Never Used  . Alcohol use Yes  . Drug use: No  . Sexual activity: Not on file   Other Topics Concern  . Not on file   Social History Narrative  . No narrative on file    Past Surgical History:  Procedure Laterality Date  . CARDIAC CATHETERIZATION    . LUMBAR LAMINECTOMY    . RADIOFREQUENCY ABLATION KIDNEY    . TOTAL KNEE ARTHROPLASTY     left    Family History  Problem Relation Age of Onset  . Diabetes Neg Hx     family sisters x6 hx of  . Hypertension Other   . Sleep apnea Other     Allergies  Allergen Reactions  . Codeine Phosphate     Current Outpatient Prescriptions on File Prior to Visit  Medication Sig Dispense Refill  . allopurinol (ZYLOPRIM) 100 MG tablet Take 1 tablet (100 mg total) by mouth daily. 90 tablet 6  . ALPRAZolam (XANAX) 1 MG tablet Take 1 tablet (1 mg total) by mouth at bedtime as  needed for anxiety. 30 tablet 0  . amLODipine (NORVASC) 10 MG tablet Take 1 tablet (10 mg total) by mouth daily. 90 tablet 1  . aspirin 81 MG tablet Take 81 mg by mouth daily.      . cetirizine (ZYRTEC) 10 MG tablet Take 10 mg by mouth daily. Reported on 08/07/2015    . losartan (COZAAR) 100 MG tablet Take 1 tablet (100 mg total) by mouth daily. 90 tablet 3  . Multiple Vitamins-Minerals (ONE-A-DAY MENS 50+ ADVANTAGE PO) Take 1 tablet by mouth daily.    . rosuvastatin (CRESTOR) 5 MG tablet Take 1 tablet (5 mg total) by mouth daily. 90 tablet 2  . traMADol (ULTRAM) 50 MG tablet TAKE 1 TABLET BY MOUTH EVERY 8 HOURS AS NEEDED 30 tablet 2  . meloxicam (MOBIC) 15 MG tablet Take 0.5-1 tablets (7.5-15 mg total) by mouth daily. (Patient not taking: Reported on 03/02/2016) 14 tablet 0   No current facility-administered medications on file prior to visit.     BP (!) 160/88 (BP Location: Left Arm, Patient Position: Sitting, Cuff Size: Large)   Pulse 94   Temp 97.9 F (36.6 C) (Oral)   Ht 5' 10.5" (1.791 m)   Wt (!) 340 lb (154.2 kg)  SpO2 98%   BMI 48.10 kg/m    Medicare wellness  1. Risk factors, based on past  M,S,F history.  Cardiovascular risk factors include hypertension and dyslipidemia  2.  Physical activities: Limited due to obesity and arthritis  3.  Depression/mood: No history of major depression or mood disorder  4.  Hearing: No deficits  5.  ADL's: Independent  6.  Fall risk: Mildly increased due to arthritis and obesity  7.  Home safety: No problems identified  8.  Height weight, and visual acuity; no change in visual acuity  9.  Counseling: Continue heart healthy diet efforts at weight loss  10. Lab orders based on risk factors: Laboratory studies reviewed  11. Referral : Nephrology referral.  Due to worsening chronic kidney disease  12. Care plan: Continue heart healthy diet and aggressive risk factor modification  13. Cognitive assessment: Alert and oriented with  normal affect no cognitive dysfunction  14. Screening: Patient provided with a written and personalized 5-10 year screening schedule in the AVS.  will need follow-up colonoscopy within the next year  22. Provider List Update: Urology primary care medicine, orthopedics and nephrology   Review of Systems  Constitutional: Negative for activity change, appetite change, chills, fatigue and fever.  HENT: Negative for congestion, dental problem, ear pain, hearing loss, mouth sores, rhinorrhea, sinus pressure, sneezing, tinnitus, trouble swallowing and voice change.   Eyes: Negative for photophobia, pain, redness and visual disturbance.  Respiratory: Negative for apnea, cough, choking, chest tightness, shortness of breath and wheezing.   Cardiovascular: Negative for chest pain, palpitations and leg swelling.  Gastrointestinal: Negative for abdominal distention, abdominal pain, anal bleeding, blood in stool, constipation, diarrhea, nausea, rectal pain and vomiting.  Genitourinary: Negative for decreased urine volume, difficulty urinating, discharge, dysuria, flank pain, frequency, genital sores, hematuria, penile swelling, scrotal swelling, testicular pain and urgency.  Musculoskeletal: Negative for arthralgias, back pain, gait problem, joint swelling, myalgias, neck pain and neck stiffness.       Right knee and left hip discomfort  Skin: Negative for color change, rash and wound.  Neurological: Negative for dizziness, tremors, seizures, syncope, facial asymmetry, speech difficulty, weakness, light-headedness, numbness and headaches.  Hematological: Negative for adenopathy. Does not bruise/bleed easily.  Psychiatric/Behavioral: Negative for agitation, behavioral problems, confusion, decreased concentration, dysphoric mood, hallucinations, self-injury, sleep disturbance and suicidal ideas. The patient is not nervous/anxious.        Objective:   Physical Exam  Constitutional: He appears well-developed  and well-nourished.  Blood pressure 140/82 (Blood pressure 160/88 on arrival)  HENT:  Head: Normocephalic and atraumatic.  Right Ear: External ear normal.  Left Ear: External ear normal.  Nose: Nose normal.  Mouth/Throat: Oropharynx is clear and moist.  Eyes: Conjunctivae and EOM are normal. Pupils are equal, round, and reactive to light. No scleral icterus.  Neck: Normal range of motion. Neck supple. No JVD present. No thyromegaly present.  Cardiovascular: Regular rhythm, normal heart sounds and intact distal pulses.  Exam reveals no gallop and no friction rub.   No murmur heard. Decreased pedal pulses on the left  Pulmonary/Chest: Effort normal and breath sounds normal. He exhibits no tenderness.  Abdominal: Soft. Bowel sounds are normal. He exhibits no distension and no mass. There is no tenderness.  Genitourinary: Penis normal.  Genitourinary Comments: Uncircumcised Declines prostate exam  Musculoskeletal: Normal range of motion. He exhibits edema. He exhibits no tenderness.    Lower extremity edema  Lymphadenopathy:    He has no cervical adenopathy.  Neurological: He is alert. He has normal reflexes. No cranial nerve deficit. Coordination normal.  Skin: Skin is warm and dry. No rash noted.  Psychiatric: He has a normal mood and affect. His behavior is normal.          Assessment & Plan:   Preventive health examination.  Annual eye examination recommended ongoing weight loss encouraged Morbid obesity Dyslipidemia.  Continue Crestor History of gout.  Stable OSA.  Continue CPAP Osteoarthritis Chronic kidney disease.  Have encouraged patient to discontinue all anti-inflammatory medications.  Nephrology referral History of renal mass, status post ablation.  Followed by urology   Hypertension, well-controlled  Recheck 6 months  Nyoka Cowden, MD

## 2016-03-02 NOTE — Patient Instructions (Signed)
Limit your sodium (Salt) intake  Avoid all anti-inflammatory medications such as meloxicam Advil, Aleve  Nephrology follow-up as discussed  Please check your blood pressure on a regular basis.  If it is consistently greater than 150/90, please make an office appointment.  You need to lose weight.  Consider a lower calorie diet and regular exercise.  Return in 6 months for follow-up

## 2016-03-02 NOTE — Progress Notes (Signed)
Pre visit review using our clinic review tool, if applicable. No additional management support is needed unless otherwise documented below in the visit note. 

## 2016-03-08 DIAGNOSIS — N183 Chronic kidney disease, stage 3 (moderate): Secondary | ICD-10-CM | POA: Diagnosis not present

## 2016-03-08 DIAGNOSIS — N281 Cyst of kidney, acquired: Secondary | ICD-10-CM | POA: Diagnosis not present

## 2016-03-27 DIAGNOSIS — M109 Gout, unspecified: Secondary | ICD-10-CM | POA: Diagnosis not present

## 2016-03-27 DIAGNOSIS — N184 Chronic kidney disease, stage 4 (severe): Secondary | ICD-10-CM | POA: Diagnosis not present

## 2016-03-27 DIAGNOSIS — I129 Hypertensive chronic kidney disease with stage 1 through stage 4 chronic kidney disease, or unspecified chronic kidney disease: Secondary | ICD-10-CM | POA: Diagnosis not present

## 2016-03-27 DIAGNOSIS — N281 Cyst of kidney, acquired: Secondary | ICD-10-CM | POA: Diagnosis not present

## 2016-04-24 ENCOUNTER — Other Ambulatory Visit: Payer: Self-pay | Admitting: Internal Medicine

## 2016-05-23 DIAGNOSIS — Z23 Encounter for immunization: Secondary | ICD-10-CM | POA: Diagnosis not present

## 2016-05-23 DIAGNOSIS — N281 Cyst of kidney, acquired: Secondary | ICD-10-CM | POA: Diagnosis not present

## 2016-05-23 DIAGNOSIS — I129 Hypertensive chronic kidney disease with stage 1 through stage 4 chronic kidney disease, or unspecified chronic kidney disease: Secondary | ICD-10-CM | POA: Diagnosis not present

## 2016-05-23 DIAGNOSIS — N184 Chronic kidney disease, stage 4 (severe): Secondary | ICD-10-CM | POA: Diagnosis not present

## 2016-05-23 DIAGNOSIS — M109 Gout, unspecified: Secondary | ICD-10-CM | POA: Diagnosis not present

## 2016-05-28 ENCOUNTER — Other Ambulatory Visit: Payer: Self-pay | Admitting: Nephrology

## 2016-05-28 DIAGNOSIS — N184 Chronic kidney disease, stage 4 (severe): Secondary | ICD-10-CM

## 2016-06-07 ENCOUNTER — Other Ambulatory Visit: Payer: Medicare Other

## 2016-06-11 ENCOUNTER — Ambulatory Visit
Admission: RE | Admit: 2016-06-11 | Discharge: 2016-06-11 | Disposition: A | Discharge status: Home / Self Care | Payer: Medicare Other | Source: Ambulatory Visit | Attending: Nephrology | Admitting: Nephrology

## 2016-06-11 DIAGNOSIS — N184 Chronic kidney disease, stage 4 (severe): Secondary | ICD-10-CM | POA: Diagnosis not present

## 2016-06-22 DIAGNOSIS — N184 Chronic kidney disease, stage 4 (severe): Secondary | ICD-10-CM | POA: Diagnosis not present

## 2016-06-22 DIAGNOSIS — M109 Gout, unspecified: Secondary | ICD-10-CM | POA: Diagnosis not present

## 2016-07-06 ENCOUNTER — Other Ambulatory Visit: Payer: Self-pay | Admitting: Internal Medicine

## 2016-07-06 ENCOUNTER — Other Ambulatory Visit: Payer: Self-pay | Admitting: Family Medicine

## 2016-07-31 ENCOUNTER — Other Ambulatory Visit: Payer: Self-pay | Admitting: Internal Medicine

## 2016-09-03 ENCOUNTER — Ambulatory Visit: Payer: Medicare Other | Admitting: Internal Medicine

## 2016-10-10 DIAGNOSIS — N184 Chronic kidney disease, stage 4 (severe): Secondary | ICD-10-CM | POA: Diagnosis not present

## 2016-10-10 DIAGNOSIS — N281 Cyst of kidney, acquired: Secondary | ICD-10-CM | POA: Diagnosis not present

## 2016-10-10 DIAGNOSIS — D631 Anemia in chronic kidney disease: Secondary | ICD-10-CM | POA: Diagnosis not present

## 2016-10-10 DIAGNOSIS — M109 Gout, unspecified: Secondary | ICD-10-CM | POA: Diagnosis not present

## 2016-10-10 DIAGNOSIS — I129 Hypertensive chronic kidney disease with stage 1 through stage 4 chronic kidney disease, or unspecified chronic kidney disease: Secondary | ICD-10-CM | POA: Diagnosis not present

## 2016-10-10 DIAGNOSIS — N2581 Secondary hyperparathyroidism of renal origin: Secondary | ICD-10-CM | POA: Diagnosis not present

## 2016-10-20 DIAGNOSIS — M5440 Lumbago with sciatica, unspecified side: Secondary | ICD-10-CM | POA: Diagnosis not present

## 2016-10-23 DIAGNOSIS — I129 Hypertensive chronic kidney disease with stage 1 through stage 4 chronic kidney disease, or unspecified chronic kidney disease: Secondary | ICD-10-CM | POA: Diagnosis not present

## 2016-10-23 DIAGNOSIS — E875 Hyperkalemia: Secondary | ICD-10-CM | POA: Diagnosis not present

## 2016-12-01 ENCOUNTER — Ambulatory Visit (INDEPENDENT_AMBULATORY_CARE_PROVIDER_SITE_OTHER): Payer: Medicare Other | Admitting: Physician Assistant

## 2016-12-01 ENCOUNTER — Encounter: Payer: Self-pay | Admitting: Physician Assistant

## 2016-12-01 VITALS — BP 145/88 | HR 95 | Temp 98.1°F | Resp 18 | Ht 71.0 in | Wt 338.2 lb

## 2016-12-01 DIAGNOSIS — R350 Frequency of micturition: Secondary | ICD-10-CM

## 2016-12-01 DIAGNOSIS — N183 Chronic kidney disease, stage 3 unspecified: Secondary | ICD-10-CM

## 2016-12-01 DIAGNOSIS — R82998 Other abnormal findings in urine: Secondary | ICD-10-CM

## 2016-12-01 DIAGNOSIS — R8299 Other abnormal findings in urine: Secondary | ICD-10-CM

## 2016-12-01 LAB — POCT CBC
Granulocyte percent: 62.2 %G (ref 37–80)
HEMATOCRIT: 29.9 % — AB (ref 43.5–53.7)
Hemoglobin: 10.4 g/dL — AB (ref 14.1–18.1)
Lymph, poc: 3.5 — AB (ref 0.6–3.4)
MCH, POC: 26.5 pg — AB (ref 27–31.2)
MCHC: 34.8 g/dL (ref 31.8–35.4)
MCV: 76.3 fL — AB (ref 80–97)
MID (cbc): 0.9 (ref 0–0.9)
MPV: 6.5 fL (ref 0–99.8)
POC GRANULOCYTE: 7.2 — AB (ref 2–6.9)
POC LYMPH %: 30.3 % (ref 10–50)
POC MID %: 7.5 %M (ref 0–12)
Platelet Count, POC: 313 10*3/uL (ref 142–424)
RBC: 3.92 M/uL — AB (ref 4.69–6.13)
RDW, POC: 15.9 %
WBC: 11.5 10*3/uL — AB (ref 4.6–10.2)

## 2016-12-01 LAB — POCT URINALYSIS DIP (MANUAL ENTRY)
Bilirubin, UA: NEGATIVE
Glucose, UA: NEGATIVE mg/dL
Ketones, POC UA: NEGATIVE mg/dL
NITRITE UA: POSITIVE — AB
SPEC GRAV UA: 1.02 (ref 1.010–1.025)
UROBILINOGEN UA: 0.2 U/dL
pH, UA: 5 (ref 5.0–8.0)

## 2016-12-01 LAB — POC MICROSCOPIC URINALYSIS (UMFC): Mucus: ABSENT

## 2016-12-01 MED ORDER — CIPROFLOXACIN HCL 250 MG PO TABS: 250.0000 mg | ORAL_TABLET | Freq: Two times a day (BID) | ORAL | 0 refills | Status: AC

## 2016-12-01 MED ORDER — CEFTRIAXONE SODIUM 1 G IJ SOLR
1.0000 g | Freq: Once | INTRAMUSCULAR | Status: AC
  Administered 2016-12-01: 1 g via INTRAMUSCULAR

## 2016-12-01 NOTE — Patient Instructions (Addendum)
  See me on Tuesday - if you start to have fevers or chills or nausea please seek care earlier   IF you received an x-ray today, you will receive an invoice from [facility]. Please contact [facility] at [phone] with questions or concerns regarding your invoice.   IF you received labwork today, you will receive an invoice from [facility]. Please contact [facility] at [phone] with questions or concerns regarding your invoice.   Our billing staff will not be able to assist you with questions regarding bills from these companies.  You will be contacted with the lab results as soon as they are available. The fastest way to get your results is to activate your My Chart account. Instructions are located on the last page of this paperwork. If you have not heard from [place] regarding the results in 2 weeks, please contact this office.

## 2016-12-01 NOTE — Progress Notes (Signed)
Alex Williams  MRN: [id number] DOB: 1951/04/24  PCP: Marletta Lor, MD  Chief Complaint  Patient presents with  . Urinary Tract Infection    C/O urinary frequency, urgency, & lower back pain x 3 days    Subjective:  Pt presents to clinic for chills that started 6 days ago that lasted for a day then resolved.  He over the last 3 days started with decreased urine output, intermittent dysuria, urinary urgency (last night every 75 mins).  Rare sexual active with wife who  L[place] in a different city - low concerns for STDs.    He has known Stage 3 kidney disease which has been unchanged for years.  Review of Systems  Constitutional: Negative for chills and fever.  Gastrointestinal: Negative for diarrhea, nausea and vomiting.  Genitourinary: Positive for decreased urine volume, difficulty urinating (closer to toliet over the last several days), dysuria and urgency. Negative for discharge, flank pain and testicular pain.    Patient Active Problem List   Diagnosis Date Noted  . Chronic kidney disease 12/10/2011  . GOUT 01/30/2010  . Obesity 01/30/2010  . Obstructive sleep apnea 01/27/2009  . HYPERCHOLESTEROLEMIA 06/30/2007  . Essential hypertension 06/27/2007  . Osteoarthritis 06/27/2007  . LOW BACK PAIN 06/27/2007  . History of colonic polyps 06/27/2007  . NEPHROLITHIASIS, HX OF 06/27/2007    Current Outpatient Prescriptions on File Prior to Visit  Medication Sig Dispense Refill  . allopurinol (ZYLOPRIM) 100 MG tablet Take 1 tablet (100 mg total) by mouth daily. 90 tablet 6  . ALPRAZolam (XANAX) 1 MG tablet Take 1 tablet (1 mg total) by mouth at bedtime as needed for anxiety. 30 tablet 0  . amLODipine (NORVASC) 10 MG tablet Take 1 tablet (10 mg total) by mouth daily. 90 tablet 1  . amLODipine (NORVASC) 10 MG tablet TAKE 1 TABLET BY MOUTH EVERY DAY 90 tablet 2  . aspirin 81 MG tablet Take 81 mg by mouth daily.      . cetirizine (ZYRTEC) 10 MG tablet Take 10 mg by mouth  daily. Reported on 08/07/2015    . losartan (COZAAR) 100 MG tablet TAKE 1 TABLET (100 MG TOTAL) BY MOUTH DAILY. 90 tablet 3  . Multiple Vitamins-Minerals (ONE-A-DAY MENS 50+ ADVANTAGE PO) Take 1 tablet by mouth daily.    . rosuvastatin (CRESTOR) 5 MG tablet TAKE 1 TABLET (5 MG TOTAL) BY MOUTH DAILY. 90 tablet 1  . traMADol (ULTRAM) 50 MG tablet TAKE 1 TABLET BY MOUTH EVERY 8 HOURS AS NEEDED 30 tablet 2   No current facility-administered medications on file prior to visit.     Allergies  Allergen Reactions  . Codeine Phosphate     Pt patients past, family and social history were reviewed and updated.   Objective:  BP (!) 145/88   Pulse 95   Temp 98.1 F (36.7 C) (Oral)   Resp 18   Ht _0  (1.803 m)   Wt (!) 338 lb 4 oz (153.4 kg)   SpO2 99%   BMI 47.18 kg/m   Physical Exam  Constitutional: He is oriented to person, place, and time and well-developed, well-nourished, and in no distress.  HENT:  Head: Normocephalic and atraumatic.  Right Ear: External ear normal.  Left Ear: External ear normal.  Eyes: Conjunctivae are normal.  Neck: Normal range of motion.  Cardiovascular: Normal rate, regular rhythm and normal heart sounds.   No murmur heard. Pulmonary/Chest: Effort normal and breath sounds normal. He has no wheezes.  Abdominal: Soft. Bowel sounds are normal. There is no tenderness. There is no CVA tenderness.  Neurological: He is alert and oriented to person, place, and time. Gait normal.  Skin: Skin is warm and dry.  Psychiatric: Mood, memory, affect and judgment normal.   Results for orders placed or performed in visit on 12/01/16  Urine culture  Result Value Ref Range   Urine Culture, Routine Final report (A)    Urine Culture result 1 Escherichia coli (A)    ANTIMICROBIAL SUSCEPTIBILITY Comment   PSA  Result Value Ref Range   Prostate Specific Ag, Serum 15.0 (H) 0.0 - 4.0 ng/mL  CMP14+EGFR  Result Value Ref Range   Glucose 100 (H) 65 - 99 mg/dL   BUN 63 (H)  8 - 27 mg/dL   Creatinine, Ser 3.79 (>) 0.76 - 1.27 mg/dL   GFR calc non Af Amer 16 (L) >59 mL/min/1.73   GFR calc Af Amer 18 (L) >59 mL/min/1.73   BUN/Creatinine Ratio 17 10 - 24   Sodium 138 134 - 144 mmol/L   Potassium 5.8 (H) 3.5 - 5.2 mmol/L   Chloride 109 (H) 96 - 106 mmol/L   CO2 14 (<) 18 - 29 mmol/L   Calcium 9.4 8.6 - 10.2 mg/dL   Total Protein 6.6 6.0 - 8.5 g/dL   Albumin 3.8 3.6 - 4.8 g/dL   Globulin, Total 2.8 1.5 - 4.5 g/dL   Albumin/Globulin Ratio 1.4 1.2 - 2.2   Bilirubin Total 0.3 0.0 - 1.2 mg/dL   Alkaline Phosphatase 112 39 - 117 IU/L   AST 27 0 - 40 IU/L   ALT 27 0 - 44 IU/L  POCT Microscopic Urinalysis ([facility])  Result Value Ref Range   WBC,UR,HPF,POC Too numerous to count  (A) None WBC/hpf   RBC,UR,HPF,POC None None RBC/hpf   Bacteria Many (A) None, Too numerous to count   Mucus Absent Absent   Epithelial Cells, UR Per Microscopy Few (A) None, Too numerous to count cells/hpf  POCT urinalysis dipstick  Result Value Ref Range   Color, UA yellow yellow   Clarity, UA cloudy (A) clear   Glucose, UA negative negative mg/dL   Bilirubin, UA negative negative   Ketones, POC UA negative negative mg/dL   Spec Grav, UA 1.020 1.010 - 1.025   Blood, UA small (A) negative   pH, UA 5.0 5.0 - 8.0   Protein Ur, POC =100 (A) negative mg/dL   Urobilinogen, UA 0.2 0.2 or 1.0 E.U./dL   Nitrite, UA Positive (A) Negative   Leukocytes, UA Large (3+) (A) Negative  POCT CBC  Result Value Ref Range   WBC 11.5 (A) 4.6 - 10.2 K/uL   Lymph, poc 3.5 (A) 0.6 - 3.4   POC LYMPH PERCENT 30.3 10 - 50 %L   MID (cbc) 0.9 0 - 0.9   POC MID % 7.5 0 - 12 %M   POC Granulocyte 7.2 (A) 2 - 6.9   Granulocyte percent 62.2 37 - 80 %G   RBC 3.92 (A) 4.69 - 6.13 M/uL   Hemoglobin 10.4 (A) 14.1 - 18.1 g/dL   HCT, POC 29.9 (A) 43.5 - 53.7 %   MCV 76.3 (A) 80 - 97 fL   MCH, POC 26.5 (A) 27 - 31.2 pg   MCHC 34.8 31.8 - 35.4 g/dL   RDW, POC 15.9 %   Platelet Count, POC 313 142 - 424 K/uL   MPV  6.5 0 - 99.8 fL     Assessment and Plan :  Urinary frequency - Plan: POCT Microscopic Urinalysis ([facility]), POCT urinalysis dipstick, PSA  Leukocytes in urine - Plan: Urine culture, POCT CBC, cefTRIAXone (ROCEPHIN) injection 1 g, ciprofloxacin (CIPRO) 250 MG tablet  Stage 3 chronic kidney disease - Plan: CMP14+EGFR   Treat with rocephin due to fevers at the beginning of the week.  D/w pt that if he develops fever, nausea or chills he is to seek care at that time otherwise see me in 3 days.  We will treat with Cipro (lower dose due to kidney function) which will cover UTI as well as acute prostatitis.    D/w Dr Lonia Skinner PA-C  [facility] at [place] 12/03/2016 8:51 PM

## 2016-12-02 LAB — CMP14+EGFR
ALT: 27 IU/L (ref 0–44)
AST: 27 IU/L (ref 0–40)
Albumin/Globulin Ratio: 1.4 (ref 1.2–2.2)
Albumin: 3.8 g/dL (ref 3.6–4.8)
Alkaline Phosphatase: 112 IU/L (ref 39–117)
BUN / CREAT RATIO: 17 (ref 10–24)
BUN: 63 mg/dL — AB (ref 8–27)
Bilirubin Total: 0.3 mg/dL (ref 0.0–1.2)
CALCIUM: 9.4 mg/dL (ref 8.6–10.2)
CO2: 14 mmol/L — AB (ref 18–29)
CREATININE: 3.79 mg/dL — AB (ref 0.76–1.27)
Chloride: 109 mmol/L — ABNORMAL HIGH (ref 96–106)
GFR calc non Af Amer: 16 mL/min/{1.73_m2} — ABNORMAL LOW (ref >59)
GFR, EST AFRICAN AMERICAN: 18 mL/min/{1.73_m2} — AB (ref >59)
Globulin, Total: 2.8 g/dL (ref 1.5–4.5)
Glucose: 100 mg/dL — ABNORMAL HIGH (ref 65–99)
Potassium: 5.8 mmol/L — ABNORMAL HIGH (ref 3.5–5.2)
Sodium: 138 mmol/L (ref 134–144)
TOTAL PROTEIN: 6.6 g/dL (ref 6.0–8.5)

## 2016-12-02 LAB — PSA: PROSTATE SPECIFIC AG, SERUM: 15 ng/mL — AB (ref 0.0–4.0)

## 2016-12-03 LAB — URINE CULTURE

## 2016-12-04 ENCOUNTER — Ambulatory Visit (INDEPENDENT_AMBULATORY_CARE_PROVIDER_SITE_OTHER): Payer: Medicare Other | Admitting: Physician Assistant

## 2016-12-04 ENCOUNTER — Encounter: Payer: Self-pay | Admitting: Physician Assistant

## 2016-12-04 VITALS — BP 140/82 | HR 72 | Temp 97.8°F | Resp 18 | Ht 71.06 in | Wt 339.0 lb

## 2016-12-04 DIAGNOSIS — R972 Elevated prostate specific antigen [PSA]: Secondary | ICD-10-CM

## 2016-12-04 DIAGNOSIS — E875 Hyperkalemia: Secondary | ICD-10-CM | POA: Diagnosis not present

## 2016-12-04 DIAGNOSIS — N183 Chronic kidney disease, stage 3 unspecified: Secondary | ICD-10-CM

## 2016-12-04 DIAGNOSIS — N410 Acute prostatitis: Secondary | ICD-10-CM | POA: Diagnosis not present

## 2016-12-04 NOTE — Progress Notes (Signed)
Alex Williams  MRN: [id number] DOB: 08-29-1950  PCP: Alex Lor, MD  Chief Complaint  Patient presents with  . Follow-up    Subjective:  Pt presents to clinic for recheck.  He is doing so much better.  His urine stream is almost back to normal and the pressure and dysuria have resolved.  He is tolerating the abx ok.  He is still waking up at night a lot to urinate.  He has had no fevers or chills.  Review of Systems  Constitutional: Negative for chills and fever.  Genitourinary: Positive for frequency (at night still but overall better). Negative for decreased urine volume, dysuria and urgency.    Patient Active Problem List   Diagnosis Date Noted  . Chronic kidney disease 12/10/2011  . GOUT 01/30/2010  . Obesity 01/30/2010  . Obstructive sleep apnea 01/27/2009  . HYPERCHOLESTEROLEMIA 06/30/2007  . Essential hypertension 06/27/2007  . Osteoarthritis 06/27/2007  . LOW BACK PAIN 06/27/2007  . History of colonic polyps 06/27/2007  . NEPHROLITHIASIS, HX OF 06/27/2007    Current Outpatient Prescriptions on File Prior to Visit  Medication Sig Dispense Refill  . ALPRAZolam (XANAX) 1 MG tablet Take 1 tablet (1 mg total) by mouth at bedtime as needed for anxiety. 30 tablet 0  . amLODipine (NORVASC) 10 MG tablet TAKE 1 TABLET BY MOUTH EVERY DAY 90 tablet 2  . aspirin 81 MG tablet Take 81 mg by mouth daily.      . cetirizine (ZYRTEC) 10 MG tablet Take 10 mg by mouth daily. Reported on 08/07/2015    . ciprofloxacin (Williams) 250 MG tablet Take 1 tablet (250 mg total) by mouth 2 (two) times daily. 20 tablet 0  . losartan (COZAAR) 100 MG tablet TAKE 1 TABLET (100 MG TOTAL) BY MOUTH DAILY. 90 tablet 3  . Multiple Vitamins-Minerals (ONE-A-DAY MENS 50+ ADVANTAGE PO) Take 1 tablet by mouth daily.    . rosuvastatin (CRESTOR) 5 MG tablet TAKE 1 TABLET (5 MG TOTAL) BY MOUTH DAILY. 90 tablet 1  . traMADol (ULTRAM) 50 MG tablet TAKE 1 TABLET BY MOUTH EVERY 8 HOURS AS NEEDED 30 tablet 2    . ULORIC 40 MG tablet Take 40 mg by mouth daily.  6   No current facility-administered medications on file prior to visit.     Allergies  Allergen Reactions  . Codeine Phosphate     Pt patients past, family and social history were reviewed and updated.   Objective:  BP 140/82   Pulse 72   Temp 97.8 F (36.6 C) (Oral)   Resp 18   Ht 5' 11.06" (1.805 m)   Wt (!) 339 lb (153.8 kg)   SpO2 97%   BMI 47.20 kg/m   Physical Exam  Constitutional: He is oriented to person, place, and time and well-developed, well-nourished, and in no distress.  HENT:  Head: Normocephalic and atraumatic.  Right Ear: External ear normal.  Left Ear: External ear normal.  Eyes: Conjunctivae are normal.  Neck: Normal range of motion.  Cardiovascular: Normal rate, regular rhythm and normal heart sounds.   No murmur heard. Pulmonary/Chest: Effort normal and breath sounds normal. He has no wheezes.  Abdominal: There is no CVA tenderness.  Neurological: He is alert and oriented to person, place, and time. Gait normal.  Skin: Skin is warm and dry.  Psychiatric: Mood, memory, affect and judgment normal.     Results for orders placed or performed in visit on 12/04/16  CMP14+EGFR  Result Value  Ref Range   Glucose 96 65 - 99 mg/dL   BUN 58 (H) 8 - 27 mg/dL   Creatinine, Ser 3.14 (>) 0.76 - 1.27 mg/dL   GFR calc non Af Amer 20 (L) >59 mL/min/1.73   GFR calc Af Amer 23 (L) >59 mL/min/1.73   BUN/Creatinine Ratio 18 10 - 24   Sodium 142 134 - 144 mmol/L   Potassium 5.8 (H) 3.5 - 5.2 mmol/L   Chloride 107 (H) 96 - 106 mmol/L   CO2 16 (L) 18 - 29 mmol/L   Calcium 9.4 8.6 - 10.2 mg/dL   Total Protein 6.4 6.0 - 8.5 g/dL   Albumin 3.9 3.6 - 4.8 g/dL   Globulin, Total 2.5 1.5 - 4.5 g/dL   Albumin/Globulin Ratio 1.6 1.2 - 2.2   Bilirubin Total 0.2 0.0 - 1.2 mg/dL   Alkaline Phosphatase 105 39 - 117 IU/L   AST 28 0 - 40 IU/L   ALT 31 0 - 44 IU/L     Assessment and Plan :  Acute prostatitis - continue  and finish the Williams- pt was given a 10 day supply which should be ok as he has significant symptoms reduction already  Stage 3 chronic kidney disease - Plan: CMP14+EGFR - Cr increased and CO2 low and we will recheck today - expect to get results tomorrow  Elevated PSA - elevated - expected to be that way with prostatitis - will recheck in 6 weeks once abx course is done -   F/u to be determine by lab results - per up to date - he will need a urine culture in 7-10d to ensure the bacteria is gone - if it is not he will need a different abx and then abx continuation for 6 weeks to ensure treatment of bacteria  Got a call report on patient labs - Cr is lower at 3.14, K remains the same at 5.8 and GFR has improved to 20, and CO2 is improved to 16.  Called Dr Alex Williams his nephrologist from [facility].  She advised Bicarb 650 bid, Lasix 76m bid, stop the losartan.  Ok to continue Williams 2573mbid for 6 weeks total.  Recheck with me next Tuesday for repeat labs. Also needs an appt with me in 6 weeks.  He has an appt with [facility] on [facility] for f/u with her.  Called and spoke with patient regarding his labs results and the importance of taking all his medications and stopping the losartan.  He will f/u with me in a week.  SaWindell HummingbirdA-C  [facility] [place] 12/05/2016 9:34 AM

## 2016-12-04 NOTE — Patient Instructions (Addendum)
Finish antibiotics until finished I will let you know about your lab results.    IF you received an x-ray today, you will receive an invoice from [facility]. Please contact [facility] at [phone] with questions or concerns regarding your invoice.   IF you received labwork today, you will receive an invoice from [place]. Please contact [facility] at [phone] with questions or concerns regarding your invoice.   Our billing staff will not be able to assist you with questions regarding bills from these companies.  You will be contacted with the lab results as soon as they are available. The fastest way to get your results is to activate your My Chart account. Instructions are located on the last page of this paperwork. If you have not heard from [place] regarding the results in 2 weeks, please contact this office.

## 2016-12-05 ENCOUNTER — Telehealth: Payer: Self-pay | Admitting: Physician Assistant

## 2016-12-05 LAB — CMP14+EGFR
ALK PHOS: 105 IU/L (ref 39–117)
ALT: 31 IU/L (ref 0–44)
AST: 28 IU/L (ref 0–40)
Albumin/Globulin Ratio: 1.6 (ref 1.2–2.2)
Albumin: 3.9 g/dL (ref 3.6–4.8)
BUN / CREAT RATIO: 18 (ref 10–24)
BUN: 58 mg/dL — AB (ref 8–27)
Bilirubin Total: 0.2 mg/dL (ref 0.0–1.2)
CALCIUM: 9.4 mg/dL (ref 8.6–10.2)
CHLORIDE: 107 mmol/L — AB (ref 96–106)
CO2: 16 mmol/L — AB (ref 18–29)
Creatinine, Ser: 3.14 mg/dL (ref 0.76–1.27)
GFR calc Af Amer: 23 mL/min/{1.73_m2} — ABNORMAL LOW (ref >59)
GFR calc non Af Amer: 20 mL/min/{1.73_m2} — ABNORMAL LOW (ref >59)
GLUCOSE: 96 mg/dL (ref 65–99)
Globulin, Total: 2.5 g/dL (ref 1.5–4.5)
Potassium: 5.8 mmol/L — ABNORMAL HIGH (ref 3.5–5.2)
Sodium: 142 mmol/L (ref 134–144)
Total Protein: 6.4 g/dL (ref 6.0–8.5)

## 2016-12-05 MED ORDER — SODIUM BICARBONATE 650 MG PO TABS: 650.0000 mg | ORAL_TABLET | Freq: Two times a day (BID) | ORAL | 0 refills | Status: DC

## 2016-12-05 MED ORDER — FUROSEMIDE 40 MG PO TABS: 40.0000 mg | ORAL_TABLET | Freq: Two times a day (BID) | ORAL | 0 refills | Status: DC

## 2016-12-05 MED ORDER — CIPROFLOXACIN HCL 250 MG PO TABS: 250.0000 mg | ORAL_TABLET | Freq: Two times a day (BID) | ORAL | 0 refills | Status: DC

## 2016-12-05 NOTE — Telephone Encounter (Signed)
Pt is returning a call from [facility].  Please advise: [phone]

## 2016-12-05 NOTE — Telephone Encounter (Signed)
States he received his medication from the pharmacy and that you can call him anytime.

## 2016-12-06 NOTE — Telephone Encounter (Signed)
Spoke tp patient - see OV note for discussion

## 2016-12-11 ENCOUNTER — Encounter: Payer: Self-pay | Admitting: Physician Assistant

## 2016-12-11 ENCOUNTER — Ambulatory Visit (INDEPENDENT_AMBULATORY_CARE_PROVIDER_SITE_OTHER): Payer: Medicare Other | Admitting: Physician Assistant

## 2016-12-11 VITALS — BP 153/97 | HR 94 | Temp 98.0°F | Resp 16 | Ht 72.5 in | Wt 334.0 lb

## 2016-12-11 DIAGNOSIS — E875 Hyperkalemia: Secondary | ICD-10-CM

## 2016-12-11 DIAGNOSIS — I10 Essential (primary) hypertension: Secondary | ICD-10-CM

## 2016-12-11 DIAGNOSIS — N410 Acute prostatitis: Secondary | ICD-10-CM | POA: Diagnosis not present

## 2016-12-11 DIAGNOSIS — N183 Chronic kidney disease, stage 3 unspecified: Secondary | ICD-10-CM

## 2016-12-11 LAB — CMP14+EGFR
A/G RATIO: 1.9 (ref 1.2–2.2)
ALBUMIN: 4.1 g/dL (ref 3.6–4.8)
ALT: 18 IU/L (ref 0–44)
AST: 15 IU/L (ref 0–40)
Alkaline Phosphatase: 104 IU/L (ref 39–117)
BILIRUBIN TOTAL: 0.4 mg/dL (ref 0.0–1.2)
BUN / CREAT RATIO: 17 (ref 10–24)
BUN: 53 mg/dL — ABNORMAL HIGH (ref 8–27)
CHLORIDE: 109 mmol/L — AB (ref 96–106)
CO2: 19 mmol/L (ref 18–29)
Calcium: 9.7 mg/dL (ref 8.6–10.2)
Creatinine, Ser: 3.09 mg/dL (ref 0.76–1.27)
GFR calc Af Amer: 23 mL/min/{1.73_m2} — ABNORMAL LOW (ref >59)
GFR calc non Af Amer: 20 mL/min/{1.73_m2} — ABNORMAL LOW (ref >59)
GLOBULIN, TOTAL: 2.2 g/dL (ref 1.5–4.5)
Glucose: 94 mg/dL (ref 65–99)
POTASSIUM: 5 mmol/L (ref 3.5–5.2)
Sodium: 139 mmol/L (ref 134–144)
TOTAL PROTEIN: 6.3 g/dL (ref 6.0–8.5)

## 2016-12-11 MED ORDER — METOPROLOL SUCCINATE ER 50 MG PO TB24: 50.0000 mg | ORAL_TABLET | ORAL | 0 refills | Status: DC

## 2016-12-11 NOTE — Addendum Note (Signed)
Addended by: Mancel Bale on: 12/11/2016 02:26 PM   Modules accepted: Orders

## 2016-12-11 NOTE — Progress Notes (Addendum)
Alex Williams  MRN: [id number] DOB: 10-06-1950  PCP: Mancel Bale, PA-C  Chief Complaint  Patient presents with  . Medication Refill    Cipro, per pt feels "better" prostate, still have lower back pain, "not as bad"  . Labwork    Subjective:  Pt presents to clinic for recheck.  He has been using his medications - he has only been using the lasix in the am because when he takes an afternoon dose he is up at night going to the bathroom.  He is having improved urine stream since last visit - it does seem weaker at night.  He is still having some low back pain but it is slightly better.  He has been checking his BP at home and since stopping the losartan - readings at home have been in the high 140s-150s/high 90s.  Review of Systems  Genitourinary: Positive for difficulty urinating (improved). Negative for dysuria, frequency and testicular pain.    Patient Active Problem List   Diagnosis Date Noted  . Morbid obesity with BMI of 45.0-49.9, adult ([place]) 12/03/2013  . Renal mass 12/02/2013  . Edema 03/16/2013  . CKD (chronic kidney disease), stage III 12/10/2011  . GOUT 01/30/2010  . Obstructive sleep apnea 01/27/2009  . HYPERCHOLESTEROLEMIA 06/30/2007  . Essential hypertension 06/27/2007  . Osteoarthritis 06/27/2007  . LOW BACK PAIN 06/27/2007  . History of colonic polyps 06/27/2007  . NEPHROLITHIASIS, HX OF 06/27/2007    Current Outpatient Prescriptions on File Prior to Visit  Medication Sig Dispense Refill  . ALPRAZolam (XANAX) 1 MG tablet Take 1 tablet (1 mg total) by mouth at bedtime as needed for anxiety. 30 tablet 0  . amLODipine (NORVASC) 10 MG tablet TAKE 1 TABLET BY MOUTH EVERY DAY 90 tablet 2  . aspirin 81 MG tablet Take 81 mg by mouth daily.      . cetirizine (ZYRTEC) 10 MG tablet Take 10 mg by mouth daily. Reported on 08/07/2015    . ciprofloxacin (CIPRO) 250 MG tablet Take 1 tablet (250 mg total) by mouth 2 (two) times daily. 20 tablet 0  . ciprofloxacin (CIPRO)  250 MG tablet Take 1 tablet (250 mg total) by mouth 2 (two) times daily. 64 tablet 0  . furosemide (LASIX) 40 MG tablet Take 1 tablet (40 mg total) by mouth 2 (two) times daily. 60 tablet 0  . Multiple Vitamins-Minerals (ONE-A-DAY MENS 50+ ADVANTAGE PO) Take 1 tablet by mouth daily.    . rosuvastatin (CRESTOR) 5 MG tablet TAKE 1 TABLET (5 MG TOTAL) BY MOUTH DAILY. 90 tablet 1  . sodium bicarbonate 650 MG tablet Take 1 tablet (650 mg total) by mouth 2 (two) times daily. 14 tablet 0  . traMADol (ULTRAM) 50 MG tablet TAKE 1 TABLET BY MOUTH EVERY 8 HOURS AS NEEDED 30 tablet 2  . ULORIC 40 MG tablet Take 40 mg by mouth daily.  6   No current facility-administered medications on file prior to visit.     Allergies  Allergen Reactions  . Codeine Phosphate     Pt patients past, family and social history were reviewed and updated.   Objective:  BP (!) 153/97   Pulse 94   Temp 98 F (36.7 C) (Oral)   Resp 16   Ht 6' 0.5" (1.842 m)   Wt (!) 334 lb (151.5 kg)   SpO2 99%   BMI 44.68 kg/m   Physical Exam  Constitutional: He is oriented to person, place, and time and well-developed, well-nourished,  and in no distress.  HENT:  Head: Normocephalic and atraumatic.  Right Ear: External ear normal.  Left Ear: External ear normal.  Eyes: Conjunctivae are normal.  Neck: Normal range of motion.  Cardiovascular: Normal rate, regular rhythm and normal heart sounds.   No murmur heard. Pulmonary/Chest: Effort normal and breath sounds normal. He has no wheezes.  Musculoskeletal:       Right lower leg: He exhibits edema (1+).       Left lower leg: He exhibits edema (1+).  Neurological: He is alert and oriented to person, place, and time. Gait normal.  Skin: Skin is warm and dry.  Psychiatric: Mood, memory, affect and judgment normal.   Results for orders placed or performed in visit on 12/11/16  CMP14+EGFR  Result Value Ref Range   Glucose 94 65 - 99 mg/dL   BUN 53 (H) 8 - 27 mg/dL    Creatinine, Ser 3.09 (>) 0.76 - 1.27 mg/dL   GFR calc non Af Amer 20 (L) >59 mL/min/1.73   GFR calc Af Amer 23 (L) >59 mL/min/1.73   BUN/Creatinine Ratio 17 10 - 24   Sodium 139 134 - 144 mmol/L   Potassium 5.0 3.5 - 5.2 mmol/L   Chloride 109 (H) 96 - 106 mmol/L   CO2 19 18 - 29 mmol/L   Calcium 9.7 8.6 - 10.2 mg/dL   Total Protein 6.3 6.0 - 8.5 g/dL   Albumin 4.1 3.6 - 4.8 g/dL   Globulin, Total 2.2 1.5 - 4.5 g/dL   Albumin/Globulin Ratio 1.9 1.2 - 2.2   Bilirubin Total 0.4 0.0 - 1.2 mg/dL   Alkaline Phosphatase 104 39 - 117 IU/L   AST 15 0 - 40 IU/L   ALT 18 0 - 44 IU/L    Assessment and Plan :  Essential hypertension - Plan: metoprolol succinate (TOPROL-XL) 50 MG 24 hr tablet - start toprol for improved BP control  Acute prostatitis - Plan: Urine culture - check culture to make sure he is on the correct abx - expect him to be due to improvement has been significant  Stage 3 chronic kidney disease - Plan: CMP14+EGFR - check labs - pt to continue lasix but consider change to demedex for better compliance - improved kidney function - will continue to monitor - he has an appt with his nephrologist in June and he will recheck with me in 6 weeks.  Hyperkalemia - Plan: CMP14+EGFR - check labs - resolved - continue lasix  Recheck in 6 weeks in an OV - lab work in 2 weeks to monitor kidney function  Called and gave patient his lab results.   Windell Hummingbird PA-C  Primary Care at [facility] 12/11/2016 2:20 PM

## 2016-12-11 NOTE — Patient Instructions (Addendum)
  demedex - for fluid and BP issue   IF you received an x-ray today, you will receive an invoice from [facility]. Please contact [facility] at [phone] with questions or concerns regarding your invoice.   IF you received labwork today, you will receive an invoice from [place]. Please contact [facility] at [phone] with questions or concerns regarding your invoice.   Our billing staff will not be able to assist you with questions regarding bills from these companies.  You will be contacted with the lab results as soon as they are available. The fastest way to get your results is to activate your My Chart account. Instructions are located on the last page of this paperwork. If you have not heard from [place] regarding the results in 2 weeks, please contact this office.

## 2016-12-12 LAB — URINE CULTURE: Organism ID, Bacteria: NO GROWTH

## 2017-01-04 ENCOUNTER — Other Ambulatory Visit: Payer: Self-pay | Admitting: Internal Medicine

## 2017-01-22 ENCOUNTER — Ambulatory Visit (INDEPENDENT_AMBULATORY_CARE_PROVIDER_SITE_OTHER): Payer: Medicare Other | Admitting: Physician Assistant

## 2017-01-22 ENCOUNTER — Encounter: Payer: Self-pay | Admitting: Physician Assistant

## 2017-01-22 VITALS — BP 140/82 | HR 86 | Temp 97.5°F | Resp 18 | Ht 72.5 in | Wt 347.4 lb

## 2017-01-22 DIAGNOSIS — E7800 Pure hypercholesterolemia, unspecified: Secondary | ICD-10-CM

## 2017-01-22 DIAGNOSIS — R600 Localized edema: Secondary | ICD-10-CM | POA: Diagnosis not present

## 2017-01-22 DIAGNOSIS — I10 Essential (primary) hypertension: Secondary | ICD-10-CM

## 2017-01-22 DIAGNOSIS — N410 Acute prostatitis: Secondary | ICD-10-CM

## 2017-01-22 DIAGNOSIS — E875 Hyperkalemia: Secondary | ICD-10-CM

## 2017-01-22 DIAGNOSIS — N183 Chronic kidney disease, stage 3 unspecified: Secondary | ICD-10-CM

## 2017-01-22 DIAGNOSIS — R972 Elevated prostate specific antigen [PSA]: Secondary | ICD-10-CM

## 2017-01-22 MED ORDER — AMLODIPINE BESYLATE 10 MG PO TABS: 10.0000 mg | ORAL_TABLET | Freq: Every day | ORAL | 0 refills | Status: DC

## 2017-01-22 NOTE — Progress Notes (Addendum)
Alex Williams  MRN: [id number] DOB: September 30, 1950  PCP: Mancel Bale, PA-C  Chief Complaint  Patient presents with  . Hypertension    follow up     Subjective:  Pt presents to clinic for recheck after his acute prostatitis in 12/01/2016.  His urine is back to normal and he feels good.  He noticed when his losartan was stopped his BP increased but it has slowly decreased.  He has been taking his Lasix daily in the am.  He feels good. BP at home in the 120s/80s.  Review of Systems  Respiratory: Negative for cough and shortness of breath.   Cardiovascular: Negative for chest pain, palpitations and leg swelling.  Genitourinary: Negative for difficulty urinating and frequency.    Patient Active Problem List   Diagnosis Date Noted  . Morbid obesity with BMI of 45.0-49.9, adult ([place]) 12/03/2013  . Renal mass 12/02/2013  . Lower extremity edema 03/16/2013  . CKD (chronic kidney disease), stage III 12/10/2011  . GOUT 01/30/2010  . Obstructive sleep apnea 01/27/2009  . HYPERCHOLESTEROLEMIA 06/30/2007  . Essential hypertension 06/27/2007  . Osteoarthritis 06/27/2007  . LOW BACK PAIN 06/27/2007  . History of colonic polyps 06/27/2007  . NEPHROLITHIASIS, HX OF 06/27/2007    Current Outpatient Prescriptions on File Prior to Visit  Medication Sig Dispense Refill  . ALPRAZolam (XANAX) 1 MG tablet Take 1 tablet (1 mg total) by mouth at bedtime as needed for anxiety. 30 tablet 0  . aspirin 81 MG tablet Take 81 mg by mouth daily.      . cetirizine (ZYRTEC) 10 MG tablet Take 10 mg by mouth daily. Reported on 08/07/2015    . furosemide (LASIX) 40 MG tablet Take 1 tablet (40 mg total) by mouth 2 (two) times daily. 60 tablet 0  . metoprolol succinate (TOPROL-XL) 50 MG 24 hr tablet Take 1 tablet (50 mg total) by mouth every morning. Take with or immediately following a meal. 90 tablet 0  . Multiple Vitamins-Minerals (ONE-A-DAY MENS 50+ ADVANTAGE PO) Take 1 tablet by mouth daily.    . rosuvastatin  (CRESTOR) 5 MG tablet TAKE 1 TABLET (5 MG TOTAL) BY MOUTH DAILY. 90 tablet 2  . traMADol (ULTRAM) 50 MG tablet TAKE 1 TABLET BY MOUTH EVERY 8 HOURS AS NEEDED 30 tablet 2  . ULORIC 40 MG tablet Take 40 mg by mouth daily.  6   No current facility-administered medications on file prior to visit.     Allergies  Allergen Reactions  . Codeine Phosphate     Pt patients past, family and social history were reviewed and updated.   Objective:  BP 140/82   Pulse 86   Temp 97.5 F (36.4 C) (Oral)   Resp 18   Ht 6' 0.5" (1.842 m)   Wt (!) 347 lb 6.4 oz (157.6 kg)   SpO2 97%   BMI 46.47 kg/m   Physical Exam  Constitutional: He is oriented to person, place, and time and well-developed, well-nourished, and in no distress.  HENT:  Head: Normocephalic and atraumatic.  Right Ear: External ear normal.  Left Ear: External ear normal.  Eyes: Conjunctivae are normal.  Neck: Normal range of motion.  Cardiovascular: Normal rate, regular rhythm, normal heart sounds and intact distal pulses.   Pulmonary/Chest: Effort normal and breath sounds normal. He has no wheezes.  Musculoskeletal:       Right lower leg: Right lower leg edema: 2+       Left lower leg: He exhibits  edema (2+).  Neurological: He is alert and oriented to person, place, and time. Gait normal.  Skin: Skin is warm and dry.  Psychiatric: Mood, memory, affect and judgment normal.    Wt Readings from Last 3 Encounters:  01/22/17 (!) 347 lb 6.4 oz (157.6 kg)  12/11/16 (!) 334 lb (151.5 kg)  12/04/16 (!) 339 lb (153.8 kg)    Assessment and Plan :  Essential hypertension - Plan: amLODipine (NORVASC) 10 MG tablet  Acute prostatitis - Plan:  - symptoms resolved  Stage 3 chronic kidney disease - Plan: CMP14+EGFR  Hyperkalemia - Plan: CMP14+EGFR  Elevated PSA - Plan: PSA - symptoms have resolve check labs to make sure resolved  HYPERCHOLESTEROLEMIA - Plan: Lipid panel  Lower extremity edema  -worse at the end of the day ?  demedex would be more helpful in the length of time in the future  Recheck labs - continue medications as he has been - I would really like him to use his Lasix bid but he is at least using it qd which is better than before.  Recheck in 6 months for a CPE  Got a call from the patients labs - CO2 is <15 - called patient and sent in bicarb 661m bid - he will likely need to be on this long term due to his CKD and the fact that it has stayed low. I will f/u with the patient as I had to leave a message on 7/4 as well as get these labs to his nephrologist.    SWindell HummingbirdPA-C  [facility] at [facility]7/09/2016 9:22 AM

## 2017-01-22 NOTE — Patient Instructions (Signed)
     IF you received an x-ray today, you will receive an invoice from [facility]. Please contact  Radiology at [phone] with questions or concerns regarding your invoice.   IF you received labwork today, you will receive an invoice from [facility]. Please contact [facility] at [phone] with questions or concerns regarding your invoice.   Our billing staff will not be able to assist you with questions regarding bills from these companies.  You will be contacted with the lab results as soon as they are available. The fastest way to get your results is to activate your My Chart account. Instructions are located on the last page of this paperwork. If you have not heard from us regarding the results in 2 weeks, please contact this office.     

## 2017-01-23 LAB — CMP14+EGFR
ALBUMIN: 4 g/dL (ref 3.6–4.8)
ALT: 17 IU/L (ref 0–44)
AST: 17 IU/L (ref 0–40)
Albumin/Globulin Ratio: 1.5 (ref 1.2–2.2)
Alkaline Phosphatase: 104 IU/L (ref 39–117)
BUN / CREAT RATIO: 17 (ref 10–24)
BUN: 54 mg/dL — AB (ref 8–27)
Bilirubin Total: 0.3 mg/dL (ref 0.0–1.2)
CALCIUM: 9.7 mg/dL (ref 8.6–10.2)
CO2: 15 mmol/L — AB (ref 20–29)
CREATININE: 3.09 mg/dL — AB (ref 0.76–1.27)
Chloride: 108 mmol/L — ABNORMAL HIGH (ref 96–106)
GFR calc Af Amer: 23 mL/min/{1.73_m2} — ABNORMAL LOW (ref >59)
GFR, EST NON AFRICAN AMERICAN: 20 mL/min/{1.73_m2} — AB (ref >59)
GLOBULIN, TOTAL: 2.7 g/dL (ref 1.5–4.5)
GLUCOSE: 105 mg/dL — AB (ref 65–99)
Potassium: 5 mmol/L (ref 3.5–5.2)
SODIUM: 141 mmol/L (ref 134–144)
Total Protein: 6.7 g/dL (ref 6.0–8.5)

## 2017-01-23 LAB — LIPID PANEL
CHOL/HDL RATIO: 5.5 ratio — AB (ref 0.0–5.0)
CHOLESTEROL TOTAL: 175 mg/dL (ref 100–199)
HDL: 32 mg/dL — ABNORMAL LOW (ref >39)
LDL CALC: 82 mg/dL (ref 0–99)
TRIGLYCERIDES: 303 mg/dL — AB (ref 0–149)
VLDL CHOLESTEROL CAL: 61 mg/dL — AB (ref 5–40)

## 2017-01-23 LAB — PSA: Prostate Specific Ag, Serum: 1.3 ng/mL (ref 0.0–4.0)

## 2017-01-23 MED ORDER — SODIUM BICARBONATE 650 MG PO TABS: 650.0000 mg | ORAL_TABLET | Freq: Two times a day (BID) | ORAL | 0 refills | Status: DC

## 2017-01-23 NOTE — Addendum Note (Signed)
Addended by: Mancel Bale on: 01/23/2017 01:16 PM   Modules accepted: Orders

## 2017-02-01 ENCOUNTER — Other Ambulatory Visit: Payer: Self-pay | Admitting: Internal Medicine

## 2017-02-26 DIAGNOSIS — N2581 Secondary hyperparathyroidism of renal origin: Secondary | ICD-10-CM | POA: Diagnosis not present

## 2017-02-26 DIAGNOSIS — D631 Anemia in chronic kidney disease: Secondary | ICD-10-CM | POA: Diagnosis not present

## 2017-02-26 DIAGNOSIS — I129 Hypertensive chronic kidney disease with stage 1 through stage 4 chronic kidney disease, or unspecified chronic kidney disease: Secondary | ICD-10-CM | POA: Diagnosis not present

## 2017-02-26 DIAGNOSIS — N281 Cyst of kidney, acquired: Secondary | ICD-10-CM | POA: Diagnosis not present

## 2017-02-26 DIAGNOSIS — N184 Chronic kidney disease, stage 4 (severe): Secondary | ICD-10-CM | POA: Diagnosis not present

## 2017-02-27 ENCOUNTER — Other Ambulatory Visit: Payer: Self-pay | Admitting: Physician Assistant

## 2017-02-27 DIAGNOSIS — N183 Chronic kidney disease, stage 3 unspecified: Secondary | ICD-10-CM

## 2017-03-08 ENCOUNTER — Other Ambulatory Visit: Payer: Self-pay | Admitting: Physician Assistant

## 2017-03-08 DIAGNOSIS — I10 Essential (primary) hypertension: Secondary | ICD-10-CM

## 2017-03-11 ENCOUNTER — Other Ambulatory Visit: Payer: Self-pay | Admitting: Physician Assistant

## 2017-03-11 DIAGNOSIS — I10 Essential (primary) hypertension: Secondary | ICD-10-CM

## 2017-03-29 ENCOUNTER — Other Ambulatory Visit: Payer: Self-pay

## 2017-03-29 NOTE — Patient Outreach (Signed)
[facility]) Care Management  03/29/2017  Arlee A Carns 06/30/1951 [id number]  Medication Adherence call to Mr. Kienan Doublin the reason for this call is because Mr. Khawaja is showing past due under [place] health Care Ins.on Losartan 100 mg  Spoke to patient he said doctor Bluford Kaufmann took him off this medication and put him on metoprolol 50 mg .   [facility] Management Direct Dial [phone]  Fax [phone] [email or web address]

## 2017-03-31 ENCOUNTER — Other Ambulatory Visit: Payer: Self-pay | Admitting: Physician Assistant

## 2017-03-31 DIAGNOSIS — N183 Chronic kidney disease, stage 3 unspecified: Secondary | ICD-10-CM

## 2017-04-01 NOTE — Telephone Encounter (Signed)
Please advise 

## 2017-04-25 DIAGNOSIS — D631 Anemia in chronic kidney disease: Secondary | ICD-10-CM | POA: Diagnosis not present

## 2017-04-25 DIAGNOSIS — N2581 Secondary hyperparathyroidism of renal origin: Secondary | ICD-10-CM | POA: Diagnosis not present

## 2017-04-25 DIAGNOSIS — I129 Hypertensive chronic kidney disease with stage 1 through stage 4 chronic kidney disease, or unspecified chronic kidney disease: Secondary | ICD-10-CM | POA: Diagnosis not present

## 2017-04-25 DIAGNOSIS — N184 Chronic kidney disease, stage 4 (severe): Secondary | ICD-10-CM | POA: Diagnosis not present

## 2017-04-25 DIAGNOSIS — M109 Gout, unspecified: Secondary | ICD-10-CM | POA: Diagnosis not present

## 2017-04-25 DIAGNOSIS — Z23 Encounter for immunization: Secondary | ICD-10-CM | POA: Diagnosis not present

## 2017-05-06 ENCOUNTER — Other Ambulatory Visit: Payer: Self-pay | Admitting: Physician Assistant

## 2017-05-06 DIAGNOSIS — N183 Chronic kidney disease, stage 3 unspecified: Secondary | ICD-10-CM

## 2017-06-03 DIAGNOSIS — N184 Chronic kidney disease, stage 4 (severe): Secondary | ICD-10-CM | POA: Diagnosis not present

## 2017-06-03 DIAGNOSIS — D631 Anemia in chronic kidney disease: Secondary | ICD-10-CM | POA: Diagnosis not present

## 2017-06-03 DIAGNOSIS — N281 Cyst of kidney, acquired: Secondary | ICD-10-CM | POA: Diagnosis not present

## 2017-06-03 DIAGNOSIS — N2581 Secondary hyperparathyroidism of renal origin: Secondary | ICD-10-CM | POA: Diagnosis not present

## 2017-06-03 DIAGNOSIS — I129 Hypertensive chronic kidney disease with stage 1 through stage 4 chronic kidney disease, or unspecified chronic kidney disease: Secondary | ICD-10-CM | POA: Diagnosis not present

## 2017-07-22 ENCOUNTER — Telehealth: Payer: Self-pay

## 2017-07-22 NOTE — Telephone Encounter (Signed)
Called pt to schedule AWV and patient said that [facility] came out to his home and did wellness visit in November 2018.    Josepha Pigg, B.A.  Care Guide - Primary Care at [place]

## 2017-07-28 ENCOUNTER — Other Ambulatory Visit: Payer: Self-pay | Admitting: Physician Assistant

## 2017-07-28 DIAGNOSIS — I10 Essential (primary) hypertension: Secondary | ICD-10-CM

## 2017-07-29 ENCOUNTER — Other Ambulatory Visit: Payer: Self-pay | Admitting: *Deleted

## 2017-07-30 ENCOUNTER — Encounter: Payer: Medicare Other | Admitting: Physician Assistant

## 2017-08-09 ENCOUNTER — Other Ambulatory Visit: Payer: Self-pay

## 2017-08-09 ENCOUNTER — Encounter: Payer: Self-pay | Admitting: Physician Assistant

## 2017-08-09 ENCOUNTER — Ambulatory Visit (INDEPENDENT_AMBULATORY_CARE_PROVIDER_SITE_OTHER): Payer: Medicare Other | Admitting: Physician Assistant

## 2017-08-09 VITALS — BP 132/82 | HR 95 | Temp 98.7°F | Resp 18 | Ht 71.65 in | Wt 354.6 lb

## 2017-08-09 DIAGNOSIS — Z0000 Encounter for general adult medical examination without abnormal findings: Secondary | ICD-10-CM | POA: Diagnosis not present

## 2017-08-09 DIAGNOSIS — I10 Essential (primary) hypertension: Secondary | ICD-10-CM | POA: Diagnosis not present

## 2017-08-09 DIAGNOSIS — Z6841 Body Mass Index (BMI) 40.0 and over, adult: Secondary | ICD-10-CM

## 2017-08-09 DIAGNOSIS — M159 Polyosteoarthritis, unspecified: Secondary | ICD-10-CM

## 2017-08-09 DIAGNOSIS — Z23 Encounter for immunization: Secondary | ICD-10-CM

## 2017-08-09 DIAGNOSIS — H547 Unspecified visual loss: Secondary | ICD-10-CM

## 2017-08-09 DIAGNOSIS — G4733 Obstructive sleep apnea (adult) (pediatric): Secondary | ICD-10-CM | POA: Diagnosis not present

## 2017-08-09 DIAGNOSIS — Z125 Encounter for screening for malignant neoplasm of prostate: Secondary | ICD-10-CM | POA: Diagnosis not present

## 2017-08-09 DIAGNOSIS — Z0001 Encounter for general adult medical examination with abnormal findings: Secondary | ICD-10-CM | POA: Diagnosis not present

## 2017-08-09 DIAGNOSIS — Z131 Encounter for screening for diabetes mellitus: Secondary | ICD-10-CM

## 2017-08-09 DIAGNOSIS — R635 Abnormal weight gain: Secondary | ICD-10-CM

## 2017-08-09 DIAGNOSIS — M109 Gout, unspecified: Secondary | ICD-10-CM | POA: Diagnosis not present

## 2017-08-09 DIAGNOSIS — N183 Chronic kidney disease, stage 3 unspecified: Secondary | ICD-10-CM

## 2017-08-09 DIAGNOSIS — E7800 Pure hypercholesterolemia, unspecified: Secondary | ICD-10-CM | POA: Diagnosis not present

## 2017-08-09 DIAGNOSIS — E875 Hyperkalemia: Secondary | ICD-10-CM | POA: Diagnosis not present

## 2017-08-09 DIAGNOSIS — R600 Localized edema: Secondary | ICD-10-CM

## 2017-08-09 DIAGNOSIS — M150 Primary generalized (osteo)arthritis: Secondary | ICD-10-CM

## 2017-08-09 MED ORDER — ROSUVASTATIN CALCIUM 5 MG PO TABS: 5.0000 mg | ORAL_TABLET | Freq: Every day | ORAL | 2 refills | Status: DC

## 2017-08-09 MED ORDER — ALPRAZOLAM 1 MG PO TABS: 1.0000 mg | ORAL_TABLET | Freq: Every evening | ORAL | 0 refills | Status: DC | PRN

## 2017-08-09 MED ORDER — AMLODIPINE BESYLATE 10 MG PO TABS: 10.0000 mg | ORAL_TABLET | Freq: Every day | ORAL | 1 refills | Status: DC

## 2017-08-09 MED ORDER — FUROSEMIDE 40 MG PO TABS: 40.0000 mg | ORAL_TABLET | Freq: Two times a day (BID) | ORAL | 1 refills | Status: DC

## 2017-08-09 MED ORDER — ZOSTER VAC RECOMB ADJUVANTED 50 MCG/0.5ML IM SUSR: 0.5000 mL | Freq: Once | INTRAMUSCULAR | 0 refills | Status: AC

## 2017-08-09 MED ORDER — METOPROLOL SUCCINATE ER 50 MG PO TB24: 50.0000 mg | ORAL_TABLET | ORAL | 1 refills | Status: DC

## 2017-08-09 NOTE — Patient Instructions (Addendum)
Ok to use [employer].  Continue to eat healthy and be physically active.  Any weight loss with be helpful.  We will determine your need for change in cholesterol medications based on your lab results.  Your vision was slightly decreased today - a visit to the eye doctor might be helpful.  Advance Directive Advance directives are legal documents that let you make choices ahead of time about your health care and medical treatment in case you become unable to communicate for yourself. Advance directives are a way for you to communicate your wishes to family, friends, and health care providers. This can help convey your decisions about end-of-life care if you become unable to communicate. Discussing and writing advance directives should happen over time rather than all at once. Advance directives can be changed depending on your situation and what you want, even after you have signed the advance directives. If you do not have an advance directive, some states assign family decision makers to act on your behalf based on how closely you are related to them. Each state has its own laws regarding advance directives. You may want to check with your health care provider, attorney, or state representative about the laws in your state. There are different types of advance directives, such as:  Medical power of attorney.  Living will.  Do not resuscitate (DNR) or do not attempt resuscitation (DNAR) order.  Health care proxy and medical power of attorney A health care proxy, also called a health care agent, is a person who is appointed to make medical decisions for you in cases in which you are unable to make the decisions yourself. Generally, people choose someone they know well and trust to represent their preferences. Make sure to ask this person for an agreement to act as your proxy. A proxy may have to exercise judgment in the event of a medical decision for which your wishes are not known. A medical power of  attorney is a legal document that names your health care proxy. Depending on the laws in your state, after the document is written, it may also need to be:  Signed.  Notarized.  Dated.  Copied.  Witnessed.  Incorporated into your medical record.  You may also want to appoint someone to manage your financial affairs in a situation in which you are unable to do so. This is called a durable power of attorney for finances. It is a separate legal document from the durable power of attorney for health care. You may choose the same person or someone different from your health care proxy to act as your agent in financial matters. If you do not appoint a proxy, or if there is a concern that the proxy is not acting in your best interests, a court-appointed guardian may be designated to act on your behalf. Living will A living will is a set of instructions documenting your wishes about medical care when you cannot express them yourself. Health care providers should keep a copy of your living will in your medical record. You may want to give a copy to family members or friends. To alert caregivers in case of an emergency, you can place a card in your wallet to let them know that you have a living will and where they can find it. A living will is used if you become:  Terminally ill.  Incapacitated.  Unable to communicate or make decisions.  Items to consider in your living will include:  The use or  non-use of life-sustaining equipment, such as dialysis machines and breathing machines (ventilators).  A DNR or DNAR order, which is the instruction not to use cardiopulmonary resuscitation (CPR) if breathing or heartbeat stops.  The use or non-use of tube feeding.  Withholding of food and fluids.  Comfort (palliative) care when the goal becomes comfort rather than a cure.  Organ and tissue donation.  A living will does not give instructions for distributing your money and property if you should  pass away. It is recommended that you seek the advice of a lawyer when writing a will. Decisions about taxes, beneficiaries, and asset distribution will be legally binding. This process can relieve your family and friends of any concerns surrounding disputes or questions that may come up about the distribution of your assets. DNR or DNAR A DNR or DNAR order is a request not to have CPR in the event that your heart stops beating or you stop breathing. If a DNR or DNAR order has not been made and shared, a health care provider will try to help any patient whose heart has stopped or who has stopped breathing. If you plan to have surgery, talk with your health care provider about how your DNR or DNAR order will be followed if problems occur. Summary  Advance directives are the legal documents that allow you to make choices ahead of time about your health care and medical treatment in case you become unable to communicate for yourself.  The process of discussing and writing advance directives should happen over time. You can change the advance directives, even after you have signed them.  Advance directives include DNR or DNAR orders, living wills, and designating an agent as your medical power of attorney. This information is not intended to replace advice given to you by your health care provider. Make sure you discuss any questions you have with your health care provider. Document Released: 10/16/2007 Document Revised: 05/28/2016 Document Reviewed: 05/28/2016 Elsevier Interactive Patient Education  2017 [employer].    IF you received an x-ray today, you will receive an invoice from [facility]. Please contact [facility] at [phone] with questions or concerns regarding your invoice.   IF you received labwork today, you will receive an invoice from [facility]. Please contact [facility] at [phone] with questions or concerns regarding your invoice.   Our billing staff will  not be able to assist you with questions regarding bills from these companies.  You will be contacted with the lab results as soon as they are available. The fastest way to get your results is to activate your My Chart account. Instructions are located on the last page of this paperwork. If you have not heard from [place] regarding the results in 2 weeks, please contact this office.

## 2017-08-09 NOTE — Progress Notes (Addendum)
Alex Williams  MRN: [id number] DOB: 1951/06/09  PCP: Mancel Bale, PA-C  Subjective:  Pt presents to clinic for a CPE/annual medicare wellness.  Interested in exercise -   Last dental exam: 14 months ago - wears a partial Last vision exam: a long time ago - no trouble seeing  Last colonoscopy: 2012 - 1 not concerning polyp removed Vaccinations - will update today     Takes Xanax only with medical prcedures  Typical meals for patient: Breakfast - 2 eggs and 2 slices of bacon Lunch - sandwich without only 1 piece of bread Dinner - salad - 3x/week, baked meat Snack - fruits and veggies and crackers rare Typical beverage choices: water, coffee in the am, 1 soda a day Exercises: walks a lot at work Sleeps: 6 hrs per night and sleeping well with CPAP   Patient Active Problem List   Diagnosis Date Noted  . Morbid obesity with BMI of 45.0-49.9, adult ([place]) 12/03/2013  . Renal mass - follows nephrologist 12/02/2013  . Lower extremity edema - taking lasix 03/16/2013  . CKD (chronic kidney disease), stage III [facility]) - followed by nephrologist - next appt 1/31 12/10/2011  . GOUT - not often 01/30/2010  . Obstructive sleep apnea - wears CPAP nightly 01/27/2009  . HYPERCHOLESTEROLEMIA - takes medications every 2-3 days - about 2-3 times a week - if he takes daily he gets leg cramps 06/30/2007  . Essential hypertension - checks at home - 140/80s at home 06/27/2007  . Osteoarthritis - takes it once a month -  06/27/2007  . LOW BACK PAIN 06/27/2007  . History of colonic polyps - last colonoscopy - 2012 - needs in 10 years 06/27/2007  . NEPHROLITHIASIS, HX OF 06/27/2007    Review of Systems  Constitutional: Negative.   HENT: Negative.   Eyes: Negative.   Respiratory: Negative.   Cardiovascular: Negative.   Gastrointestinal: Negative.   Endocrine: Negative.   Genitourinary: Negative.   Musculoskeletal: Negative.   Skin: Negative.   Allergic/Immunologic: Negative.     Neurological: Negative.   Hematological: Negative.   Psychiatric/Behavioral: Negative.     Fall Risk  08/09/2017 01/22/2017 12/11/2016 12/04/2016 12/01/2016  Falls in the past year? No Yes No No No  Number falls in past yr: - 1 - - -  Injury with Fall? - Yes - - -  Comment - pulled groin muscle  - - -   Advance directives - he does not have  No problems with ADLs. No concerns with safety at home.   Depression screen [facility] 2/9 08/09/2017 01/22/2017 12/11/2016  Decreased Interest 0 0 0  Down, Depressed, Hopeless 0 0 0  PHQ - 2 Score 0 0 0     Current Outpatient Medications on File Prior to Visit  Medication Sig Dispense Refill  . aspirin 81 MG tablet Take 81 mg by mouth daily.      . cetirizine (ZYRTEC) 10 MG tablet Take 10 mg by mouth daily. Reported on 08/07/2015    . Multiple Vitamins-Minerals (ONE-A-DAY MENS 50+ ADVANTAGE PO) Take 1 tablet by mouth daily.    . traMADol (ULTRAM) 50 MG tablet TAKE 1 TABLET BY MOUTH EVERY 8 HOURS AS NEEDED 30 tablet 2  . ULORIC 40 MG tablet Take 40 mg by mouth daily.  6  . sodium bicarbonate 650 MG tablet TAKE 1 TABLET TWICE A DAY (Patient not taking: Reported on 08/09/2017) 60 tablet 0   No current facility-administered medications on file prior to visit.  Allergies  Allergen Reactions  . Codeine Phosphate     Social History   Socioeconomic History  . Marital status: Married    Spouse name: None  . Number of children: None  . Years of education: None  . Highest education level: None  Social Needs  . Financial resource strain: Not hard at all  . Food insecurity - worry: Never true  . Food insecurity - inability: Never true  . Transportation needs - medical: No  . Transportation needs - non-medical: No  Occupational History  . None  Tobacco Use  . Smoking status: Former Smoker    Last attempt to quit: 07/23/1980    Years since quitting: 37.0  . Smokeless tobacco: Never Used  Substance and Sexual Activity  . Alcohol use: Yes  . Drug  use: No  . Sexual activity: None  Other Topics Concern  . None  Social History Narrative  . None    Past Surgical History:  Procedure Laterality Date  . CARDIAC CATHETERIZATION    . JOINT REPLACEMENT    . LUMBAR LAMINECTOMY    . RADIOFREQUENCY ABLATION KIDNEY    . TOTAL KNEE ARTHROPLASTY     left    Family History  Problem Relation Age of Onset  . Hypertension Other   . Sleep apnea Other   . Diabetes Sister   . Hyperlipidemia Sister      Objective:  BP 132/82   Pulse 95   Temp 98.7 F (37.1 C) (Oral)   Resp 18   Ht 5' 11.65" (1.82 m)   Wt (!) 354 lb 9.6 oz (160.8 kg)   SpO2 98%   BMI 48.56 kg/m   Physical Exam  Constitutional: He is oriented to person, place, and time and well-developed, well-nourished, and in no distress.  HENT:  Head: Normocephalic and atraumatic.  Right Ear: Hearing, tympanic membrane, external ear and ear canal normal.  Left Ear: Hearing, tympanic membrane, external ear and ear canal normal.  Nose: Nose normal.  Mouth/Throat: Uvula is midline, oropharynx is clear and moist and mucous membranes are normal.  Eyes: Conjunctivae and EOM are normal. Pupils are equal, round, and reactive to light.  Neck: Trachea normal and normal range of motion. Neck supple. No thyroid mass and no thyromegaly present.  Cardiovascular: Normal rate, regular rhythm and normal heart sounds.  No murmur heard. Pulmonary/Chest: Effort normal and breath sounds normal.  Abdominal: Soft. Bowel sounds are normal.  Genitourinary: Rectal exam shows external hemorrhoid.  Genitourinary Comments: Unable to feel prostate due to difficult exam  Musculoskeletal: Normal range of motion.       Right lower leg: Right lower leg edema: 2-3 + pitting edema to knee.       Left lower leg: He exhibits edema (2 to about 3 inches below knee).  Neurological: He is alert and oriented to person, place, and time. Gait normal.  Skin: Skin is warm and dry.  Psychiatric: Mood, memory, affect  and judgment normal.    Wt Readings from Last 3 Encounters:  08/09/17 (!) 354 lb 9.6 oz (160.8 kg)  01/22/17 (!) 347 lb 6.4 oz (157.6 kg)  12/11/16 (!) 334 lb (151.5 kg)     Visual Acuity Screening   Right eye Left eye Both eyes  Without correction: 20/40-1 20/70 20/30  With correction:      Rhythm: sinus rhythm at a rate of 76. Findings: short PR interval no acute changes Last EKG: 2017  Changes from last EKG: no I have  personally reviewed the EKG tracing and agree with the computerized printout.  Assessment and Plan :  Medicare annual wellness visit, initial  Essential hypertension - Plan: EKG 12-Lead, CMP14+EGFR, amLODipine (NORVASC) 10 MG tablet, metoprolol succinate (TOPROL-XL) 50 MG 24 hr tablet - controlled on medications  Obstructive sleep apnea - continue CPAP usage  Primary osteoarthritis involving multiple joints - rarely uses pain medications - does not need refill today  CKD (chronic kidney disease), stage III ([place]) - Plan: CBC with Differential/Platelet, Urinalysis, dipstick only - BUN and Cr are worse f/u with nephrologist -   Gout, unspecified cause, unspecified chronicity, unspecified site - rare attack - uses cholcocine - he is on Uloric for his kidney disease that is likely helping his gout  HYPERCHOLESTEROLEMIA - Plan: Lipid panel - check labs - patient is only about to take his crestor about 2-3 times a week due to myalgias - we will have to determine how to continue based on his labs - he has started a krill oil that may be helpful - he may need a triglyceride medication to augment his statin  Lower extremity edema - continue lasix - would be better if he would take bid but he will not and the swelling has not gotten worse  Morbid obesity with BMI of 45.0-49.9, adult ([place])  Screening for diabetes mellitus - Plan: Hemoglobin A1c  Screening for prostate cancer - Plan: PSA  Weight gain - pt encouraged to continue physical activity  Decreased vision -  encouraged pt to have an eye exam  Hyperkalemia - Plan: furosemide (LASIX) 40 MG tablet - pt was encouraged to increase his lasix 3-4 times a week to bid dosing to help decrease his potassium  Stage 3 chronic kidney disease ([place]) - Plan: furosemide (LASIX) 40 MG tablet - pt was encouraged to restart his bicarb and Rx was sent to the [facility]rmacy  Need for shingles vaccine - Plan: Zoster Vaccine Adjuvanted [facility]) injection - pt will get at pharmacy  Need for prophylactic vaccination against Streptococcus pneumoniae (pneumococcus) - Plan: Pneumococcal polysaccharide vaccine 23-valent greater than or equal to 2yo subcutaneous/IM  Windell Hummingbird PA-C  Primary Care at [place] 08/09/2017 9:00 AM

## 2017-08-10 LAB — HEMOGLOBIN A1C
Est. average glucose Bld gHb Est-mCnc: 114 mg/dL
Hgb A1c MFr Bld: 5.6 % (ref 4.8–5.6)

## 2017-08-10 LAB — CMP14+EGFR
ALBUMIN: 4 g/dL (ref 3.6–4.8)
ALT: 12 IU/L (ref 0–44)
AST: 12 IU/L (ref 0–40)
Albumin/Globulin Ratio: 1.6 (ref 1.2–2.2)
Alkaline Phosphatase: 95 IU/L (ref 39–117)
BUN / CREAT RATIO: 16 (ref 10–24)
BUN: 66 mg/dL — ABNORMAL HIGH (ref 8–27)
Bilirubin Total: 0.3 mg/dL (ref 0.0–1.2)
CALCIUM: 9.4 mg/dL (ref 8.6–10.2)
CO2: 16 mmol/L — AB (ref 20–29)
CREATININE: 4.17 mg/dL — AB (ref 0.76–1.27)
Chloride: 107 mmol/L — ABNORMAL HIGH (ref 96–106)
GFR, EST AFRICAN AMERICAN: 16 mL/min/{1.73_m2} — AB (ref >59)
GFR, EST NON AFRICAN AMERICAN: 14 mL/min/{1.73_m2} — AB (ref >59)
Globulin, Total: 2.5 g/dL (ref 1.5–4.5)
Glucose: 95 mg/dL (ref 65–99)
Potassium: 5.7 mmol/L — ABNORMAL HIGH (ref 3.5–5.2)
SODIUM: 142 mmol/L (ref 134–144)
Total Protein: 6.5 g/dL (ref 6.0–8.5)

## 2017-08-10 LAB — CBC WITH DIFFERENTIAL/PLATELET
BASOS: 1 %
Basophils Absolute: 0.1 10*3/uL (ref 0.0–0.2)
EOS (ABSOLUTE): 0.2 10*3/uL (ref 0.0–0.4)
Eos: 3 %
HEMOGLOBIN: 10.8 g/dL — AB (ref 13.0–17.7)
Hematocrit: 32.7 % — ABNORMAL LOW (ref 37.5–51.0)
IMMATURE GRANS (ABS): 0 10*3/uL (ref 0.0–0.1)
Immature Granulocytes: 0 %
Lymphocytes Absolute: 2.4 10*3/uL (ref 0.7–3.1)
Lymphs: 31 %
MCH: 25.5 pg — AB (ref 26.6–33.0)
MCHC: 33 g/dL (ref 31.5–35.7)
MCV: 77 fL — AB (ref 79–97)
MONOCYTES: 7 %
Monocytes Absolute: 0.5 10*3/uL (ref 0.1–0.9)
NEUTROS ABS: 4.5 10*3/uL (ref 1.4–7.0)
Neutrophils: 58 %
Platelets: 259 10*3/uL (ref 150–379)
RBC: 4.23 x10E6/uL (ref 4.14–5.80)
RDW: 16.2 % — ABNORMAL HIGH (ref 12.3–15.4)
WBC: 7.7 10*3/uL (ref 3.4–10.8)

## 2017-08-10 LAB — LIPID PANEL
CHOL/HDL RATIO: 5.3 ratio — AB (ref 0.0–5.0)
Cholesterol, Total: 185 mg/dL (ref 100–199)
HDL: 35 mg/dL — ABNORMAL LOW (ref >39)
LDL CALC: 119 mg/dL — AB (ref 0–99)
TRIGLYCERIDES: 157 mg/dL — AB (ref 0–149)
VLDL CHOLESTEROL CAL: 31 mg/dL (ref 5–40)

## 2017-08-10 LAB — URINALYSIS, DIPSTICK ONLY
Bilirubin, UA: NEGATIVE
GLUCOSE, UA: NEGATIVE
Ketones, UA: NEGATIVE
Leukocytes, UA: NEGATIVE
Nitrite, UA: NEGATIVE
PH UA: 5.5 (ref 5.0–7.5)
RBC, UA: NEGATIVE
Specific Gravity, UA: 1.017 (ref 1.005–1.030)
UUROB: 0.2 mg/dL (ref 0.2–1.0)

## 2017-08-10 LAB — PSA: PROSTATE SPECIFIC AG, SERUM: 1 ng/mL (ref 0.0–4.0)

## 2017-08-13 MED ORDER — SODIUM BICARBONATE 650 MG PO TABS: 650.0000 mg | ORAL_TABLET | Freq: Two times a day (BID) | ORAL | 0 refills | Status: DC

## 2017-08-13 NOTE — Addendum Note (Signed)
Addended by: Mancel Bale on: 08/13/2017 09:40 AM   Modules accepted: Orders

## 2017-09-10 ENCOUNTER — Telehealth: Payer: Self-pay | Admitting: Physician Assistant

## 2017-09-10 NOTE — Telephone Encounter (Signed)
Pt called questioning when TDAP due. Info provided, 01/28/2019.

## 2017-09-20 DIAGNOSIS — D631 Anemia in chronic kidney disease: Secondary | ICD-10-CM | POA: Diagnosis not present

## 2017-09-20 DIAGNOSIS — N184 Chronic kidney disease, stage 4 (severe): Secondary | ICD-10-CM | POA: Diagnosis not present

## 2017-09-20 DIAGNOSIS — N281 Cyst of kidney, acquired: Secondary | ICD-10-CM | POA: Diagnosis not present

## 2017-09-20 DIAGNOSIS — I129 Hypertensive chronic kidney disease with stage 1 through stage 4 chronic kidney disease, or unspecified chronic kidney disease: Secondary | ICD-10-CM | POA: Diagnosis not present

## 2017-09-20 DIAGNOSIS — N2581 Secondary hyperparathyroidism of renal origin: Secondary | ICD-10-CM | POA: Diagnosis not present

## 2017-10-28 DIAGNOSIS — Z0130 Encounter for examination of blood pressure without abnormal findings: Secondary | ICD-10-CM | POA: Diagnosis not present

## 2017-10-28 DIAGNOSIS — E782 Mixed hyperlipidemia: Secondary | ICD-10-CM | POA: Diagnosis not present

## 2017-10-28 DIAGNOSIS — J0100 Acute maxillary sinusitis, unspecified: Secondary | ICD-10-CM | POA: Diagnosis not present

## 2017-11-11 DIAGNOSIS — N184 Chronic kidney disease, stage 4 (severe): Secondary | ICD-10-CM | POA: Diagnosis not present

## 2017-11-11 DIAGNOSIS — N189 Chronic kidney disease, unspecified: Secondary | ICD-10-CM | POA: Diagnosis not present

## 2017-11-13 ENCOUNTER — Other Ambulatory Visit: Payer: Self-pay | Admitting: Physician Assistant

## 2017-11-13 DIAGNOSIS — N183 Chronic kidney disease, stage 3 unspecified: Secondary | ICD-10-CM

## 2017-11-22 DIAGNOSIS — I129 Hypertensive chronic kidney disease with stage 1 through stage 4 chronic kidney disease, or unspecified chronic kidney disease: Secondary | ICD-10-CM | POA: Diagnosis not present

## 2017-11-22 DIAGNOSIS — D631 Anemia in chronic kidney disease: Secondary | ICD-10-CM | POA: Diagnosis not present

## 2017-11-22 DIAGNOSIS — N2581 Secondary hyperparathyroidism of renal origin: Secondary | ICD-10-CM | POA: Diagnosis not present

## 2017-11-22 DIAGNOSIS — N281 Cyst of kidney, acquired: Secondary | ICD-10-CM | POA: Diagnosis not present

## 2017-11-22 DIAGNOSIS — N184 Chronic kidney disease, stage 4 (severe): Secondary | ICD-10-CM | POA: Diagnosis not present

## 2017-11-27 ENCOUNTER — Other Ambulatory Visit: Payer: Self-pay

## 2017-11-27 DIAGNOSIS — N185 Chronic kidney disease, stage 5: Secondary | ICD-10-CM

## 2017-11-27 DIAGNOSIS — N183 Chronic kidney disease, stage 3 unspecified: Secondary | ICD-10-CM

## 2017-12-10 ENCOUNTER — Other Ambulatory Visit (HOSPITAL_COMMUNITY): Payer: Self-pay | Admitting: *Deleted

## 2017-12-11 ENCOUNTER — Ambulatory Visit (HOSPITAL_COMMUNITY): Payer: Medicare Other

## 2017-12-19 ENCOUNTER — Ambulatory Visit (HOSPITAL_COMMUNITY): Admission: RE | Admit: 2017-12-19 | Payer: Medicare Other | Source: Ambulatory Visit

## 2017-12-25 ENCOUNTER — Ambulatory Visit (HOSPITAL_COMMUNITY)
Admission: RE | Admit: 2017-12-25 | Discharge: 2017-12-25 | Disposition: A | Discharge status: Home / Self Care | Payer: Medicare Other | Source: Ambulatory Visit | Attending: Nephrology | Admitting: Nephrology

## 2017-12-25 DIAGNOSIS — N189 Chronic kidney disease, unspecified: Secondary | ICD-10-CM | POA: Insufficient documentation

## 2017-12-25 DIAGNOSIS — D631 Anemia in chronic kidney disease: Secondary | ICD-10-CM | POA: Insufficient documentation

## 2017-12-25 MED ORDER — SODIUM CHLORIDE 0.9 % IV SOLN
510.0000 mg | INTRAVENOUS | Status: DC
  Administered 2017-12-25: 510 mg via INTRAVENOUS
  Filled 2017-12-25: qty 17

## 2017-12-26 ENCOUNTER — Encounter (HOSPITAL_COMMUNITY): Payer: Medicare Other

## 2017-12-27 ENCOUNTER — Encounter (HOSPITAL_COMMUNITY): Payer: Medicare Other

## 2017-12-27 ENCOUNTER — Other Ambulatory Visit (HOSPITAL_COMMUNITY): Payer: Medicare Other

## 2017-12-27 ENCOUNTER — Encounter: Payer: Medicare Other | Admitting: Vascular Surgery

## 2018-01-01 ENCOUNTER — Ambulatory Visit (HOSPITAL_COMMUNITY)
Admission: RE | Admit: 2018-01-01 | Discharge: 2018-01-01 | Disposition: A | Discharge status: Home / Self Care | Payer: Medicare Other | Source: Ambulatory Visit | Attending: Nephrology | Admitting: Nephrology

## 2018-01-01 DIAGNOSIS — D631 Anemia in chronic kidney disease: Secondary | ICD-10-CM | POA: Insufficient documentation

## 2018-01-01 DIAGNOSIS — N189 Chronic kidney disease, unspecified: Secondary | ICD-10-CM | POA: Insufficient documentation

## 2018-01-01 MED ORDER — SODIUM CHLORIDE 0.9 % IV SOLN
510.0000 mg | INTRAVENOUS | Status: DC
  Administered 2018-01-01: 510 mg via INTRAVENOUS
  Filled 2018-01-01: qty 17

## 2018-01-27 ENCOUNTER — Encounter: Payer: Self-pay | Admitting: Family Medicine

## 2018-01-27 ENCOUNTER — Other Ambulatory Visit: Payer: Self-pay

## 2018-01-27 ENCOUNTER — Ambulatory Visit (INDEPENDENT_AMBULATORY_CARE_PROVIDER_SITE_OTHER): Payer: Medicare Other | Admitting: Family Medicine

## 2018-01-27 VITALS — BP 128/82 | HR 100 | Temp 97.8°F | Ht 71.75 in | Wt 348.0 lb

## 2018-01-27 DIAGNOSIS — N185 Chronic kidney disease, stage 5: Secondary | ICD-10-CM | POA: Diagnosis not present

## 2018-01-27 DIAGNOSIS — H60391 Other infective otitis externa, right ear: Secondary | ICD-10-CM | POA: Diagnosis not present

## 2018-01-27 DIAGNOSIS — M545 Low back pain, unspecified: Secondary | ICD-10-CM

## 2018-01-27 MED ORDER — AMOXICILLIN-POT CLAVULANATE 500-125 MG PO TABS: 1.0000 | ORAL_TABLET | Freq: Every day | ORAL | 0 refills | Status: DC

## 2018-01-27 MED ORDER — CIPROFLOXACIN-HYDROCORTISONE 0.2-1 % OT SUSP: 3.0000 [drp] | Freq: Two times a day (BID) | OTIC | 0 refills | Status: DC

## 2018-01-27 NOTE — Progress Notes (Signed)
7/8/20193:38 PM  Alex Williams 29-Nov-1950, 67 y.o. male [id number]  Chief Complaint  Patient presents with  . Ear Pain    onset yesterday on the right side. Hurts when he swallows  . Back Pain    did not use to incorrect lifting technique picking up heavy bag weighing 50lbs, back has been hurting every since for the last 3 wks    HPI:   Patient is a 67 y.o. male with past medical history significant for CKD5, HTN, DDD s/p lumbar spine surgery who presents today for significantly worsening right ear since yesterday  He denies any URI sx, fever or chills He reports pain in right ear is severe, denies drainage nor hearing loss  Reports about 3 weeks ago was lifting bag of cement, used inappropriate lifting technique Threw out his back, denies any radiation down his legs, changes in stool or bladder function He has been taking APAP and using OTC ointments Ice helps better than heat Has not been using his tramadol  gfr 14 in Jan 2019, sees renal this friday Reports BP normally 140/80  Fall Risk  01/27/2018 08/09/2017 01/22/2017 12/11/2016 12/04/2016  Falls in the past year? No No Yes No No  Number falls in past yr: - - 1 - -  Injury with Fall? - - Yes - -  Comment - - pulled groin muscle  - -     Depression screen [facility] 2/9 01/27/2018 08/09/2017 01/22/2017  Decreased Interest 0 0 0  Down, Depressed, Hopeless 0 0 0  PHQ - 2 Score 0 0 0    Allergies  Allergen Reactions  . Codeine Phosphate     Prior to Admission medications   Medication Sig Start Date End Date Taking? Authorizing Provider  ALPRAZolam Duanne Moron) 1 MG tablet Take 1 tablet (1 mg total) by mouth at bedtime as needed for anxiety. 08/09/17  Yes Weber, Sarah L, PA-C  amLODipine (NORVASC) 10 MG tablet Take 1 tablet (10 mg total) by mouth daily. 08/09/17  Yes Weber, Damaris Hippo, PA-C  aspirin 81 MG tablet Take 81 mg by mouth daily.     Yes [provider]  cetirizine (ZYRTEC) 10 MG tablet Take 10 mg by mouth daily. Reported  on 08/07/2015   Yes [provider]  furosemide (LASIX) 40 MG tablet Take 1 tablet (40 mg total) by mouth 2 (two) times daily. 08/09/17  Yes Weber, Sarah L, PA-C  metoprolol succinate (TOPROL-XL) 50 MG 24 hr tablet Take 1 tablet (50 mg total) by mouth every morning. Take with or immediately following a meal. 08/09/17  Yes Weber, Sarah L, PA-C  Multiple Vitamins-Minerals (ONE-A-DAY MENS 50+ ADVANTAGE PO) Take 1 tablet by mouth daily.   Yes [provider]  rosuvastatin (CRESTOR) 5 MG tablet Take 1 tablet (5 mg total) by mouth daily. 08/09/17  Yes Weber, Sarah L, PA-C  traMADol (ULTRAM) 50 MG tablet TAKE 1 TABLET BY MOUTH EVERY 8 HOURS AS NEEDED 04/24/16  Yes Marletta Lor, MD  ULORIC 40 MG tablet Take 40 mg by mouth daily. 11/06/16  Yes [provider]  sodium bicarbonate 650 MG tablet TAKE 1 TABLET TWICE A DAY Patient not taking: Reported on 01/27/2018 11/13/17   Mancel Bale, PA-C    Past Medical History:  Diagnosis Date  . Cervical disc disease   . Chronic kidney disease   . COLONIC POLYPS, HX OF 06/27/2007  . EXOGENOUS OBESITY 01/30/2010  . GOUT 01/30/2010  . HYPERCHOLESTEROLEMIA 06/30/2007  . HYPERTENSION  06/27/2007  . LOW BACK PAIN 06/27/2007  . NEPHROLITHIASIS, HX OF 06/27/2007  . OSTEOARTHRITIS 06/27/2007  . SLEEP APNEA, OBSTRUCTIVE, MODERATE 01/27/2009    Past Surgical History:  Procedure Laterality Date  . CARDIAC CATHETERIZATION    . JOINT REPLACEMENT    . LUMBAR LAMINECTOMY    . RADIOFREQUENCY ABLATION KIDNEY    . TOTAL KNEE ARTHROPLASTY     left    Social History   Tobacco Use  . Smoking status: Former Smoker    Last attempt to quit: 07/23/1980    Years since quitting: 37.5  . Smokeless tobacco: Never Used  Substance Use Topics  . Alcohol use: Yes    Family History  Problem Relation Age of Onset  . Hypertension Other   . Sleep apnea Other   . Diabetes Sister   . Hyperlipidemia Sister     Review of Systems  Constitutional: Negative for  chills and fever.  Respiratory: Negative for cough and shortness of breath.   Cardiovascular: Positive for leg swelling. Negative for chest pain and palpitations.  Gastrointestinal: Negative for abdominal pain, nausea and vomiting.   Per hpi  OBJECTIVE:  Blood pressure (!) 160/94, pulse 100, temperature 97.8 F (36.6 C), temperature source Oral, height 5' 11.75" (1.822 m), weight (!) 348 lb (157.9 kg), SpO2 97 %.  Repeat BP 128/82  Physical Exam  Constitutional: He is oriented to person, place, and time. He appears well-developed and well-nourished.  HENT:  Head: Normocephalic and atraumatic.  Right Ear: Hearing, tympanic membrane and external ear normal. There is swelling and tenderness.  Left Ear: Hearing, tympanic membrane, external ear and ear canal normal.  Mouth/Throat: Oropharynx is clear and moist. No oropharyngeal exudate.  External canal with moderate amount of sloughing and moisture, a very minimal opening of about 55mm, needed a pedi speculum for eval, uncomfortable with exam. Ear canal mildly debrided with curette by me. Wick placed.   Eyes: Pupils are equal, round, and reactive to light. Conjunctivae and EOM are normal.  Neck: Neck supple.  Cardiovascular: Normal rate and regular rhythm. Exam reveals no gallop and no friction rub.  No murmur heard. Pulmonary/Chest: Effort normal and breath sounds normal. He has no wheezes. He has no rales.  Musculoskeletal: He exhibits edema.  Lymphadenopathy:    He has no cervical adenopathy.  Neurological: He is alert and oriented to person, place, and time.  Skin: Skin is warm and dry.  Nursing note and vitals reviewed.    ASSESSMENT and PLAN  1. Other infective acute otitis externa of right ear Very swollen and tender. Wick placed. Treating with oral and topical abx given rapid progression of pain and swelling. augmentin renally dosed to CKD 5. Discussed supportive measures, new meds r/se/b and RTC precautions. Patient  educational handout given.  2. CKD (chronic kidney disease) stage 5, GFR less than 15 ml/min (HCC) Has upcoming appt with renal. If improved function, might need to adjust augmentin.Stressed importance of BP control.   3. Acute bilateral low back pain without sciatica Acute on chronic. discussed cont supportive measures. Can use tramadol that has been prescribed to him for this reason.   Other orders - ciprofloxacin-hydrocortisone (CIPRO HC OTIC) OTIC suspension; Place 3 drops into the right ear 2 (two) times daily. - amoxicillin-clavulanate (AUGMENTIN) 500-125 MG tablet; Take 1 tablet (500 mg total) by mouth daily.  Return in about 2 days (around 01/29/2018). for removal of wick and eval of otitis externa    [name], MD Primary Care  at [place], [place] [address] Ph.  [phone] Fax [phone]

## 2018-01-27 NOTE — Patient Instructions (Addendum)
IF you received an x-ray today, you will receive an invoice from [facility]. Please contact [facility] at [phone] with questions or concerns regarding your invoice.   IF you received labwork today, you will receive an invoice from [facility]. Please contact [facility] at [phone] with questions or concerns regarding your invoice.   Our billing staff will not be able to assist you with questions regarding bills from these companies.  You will be contacted with the lab results as soon as they are available. The fastest way to get your results is to activate your My Chart account. Instructions are located on the last page of this paperwork. If you have not heard from [place] regarding the results in 2 weeks, please contact this office.     Otitis Externa Otitis externa is an infection of the outer ear canal. The outer ear canal is the area between the outside of the ear and the eardrum. Otitis externa is sometimes called "swimmer's ear." What are the causes? This condition may be caused by:  Swimming in dirty water.  Moisture in the ear.  An injury to the inside of the ear.  An object stuck in the ear.  A cut or scrape on the outside of the ear.  What increases the risk? This condition is more likely to develop in swimmers. What are the signs or symptoms? The first symptom of this condition is often itching in the ear. Later signs and symptoms include:  Swelling of the ear.  Redness in the ear.  Ear pain. The pain may get worse when you pull on your ear.  Pus coming from the ear.  How is this diagnosed? This condition may be diagnosed by examining the ear and testing fluid from the ear for bacteria and funguses. How is this treated? This condition may be treated with:  Antibiotic ear drops. These are often given for 10-14 days.  Medicine to reduce itching and swelling.  Follow these instructions at home:  If you were prescribed antibiotic ear  drops, apply them as told by your health care provider. Do not stop using the antibiotic even if your condition improves.  Take over-the-counter and prescription medicines only as told by your health care provider.  Keep all follow-up visits as told by your health care provider. This is important. How is this prevented?  Keep your ear dry. Use the corner of a towel to dry your ear after you swim or bathe.  Avoid scratching or putting things in your ear. Doing these things can damage the ear canal or remove the protective wax that lines it, which makes it easier for bacteria and funguses to grow.  Avoid swimming in lakes, polluted water, or pools that may not have the right amount of chlorine.  Consider making ear drops and putting 3 or 4 drops in each ear after you swim. Ask your health care provider about how you can make ear drops. Contact a health care provider if:  You have a fever.  After 3 days your ear is still red, swollen, painful, or draining pus.  Your redness, swelling, or pain gets worse.  You have a severe headache.  You have redness, swelling, pain, or tenderness in the area behind your ear. This information is not intended to replace advice given to you by your health care provider. Make sure you discuss any questions you have with your health care provider. Document Released: 07/09/2005 Document Revised: 08/16/2015 Document Reviewed: 04/18/2015 Elsevier Interactive Patient  Education  2018 [employer]  

## 2018-01-30 ENCOUNTER — Encounter: Payer: Self-pay | Admitting: Physician Assistant

## 2018-01-30 ENCOUNTER — Ambulatory Visit: Payer: Medicare Other | Admitting: Physician Assistant

## 2018-01-30 ENCOUNTER — Other Ambulatory Visit: Payer: Self-pay

## 2018-01-30 VITALS — BP 136/86 | HR 94 | Temp 98.3°F | Resp 16 | Ht 71.0 in | Wt 350.2 lb

## 2018-01-30 DIAGNOSIS — H6091 Unspecified otitis externa, right ear: Secondary | ICD-10-CM

## 2018-01-30 DIAGNOSIS — G8929 Other chronic pain: Secondary | ICD-10-CM | POA: Diagnosis not present

## 2018-01-30 DIAGNOSIS — N185 Chronic kidney disease, stage 5: Secondary | ICD-10-CM | POA: Diagnosis not present

## 2018-01-30 DIAGNOSIS — M545 Low back pain, unspecified: Secondary | ICD-10-CM

## 2018-01-30 DIAGNOSIS — I120 Hypertensive chronic kidney disease with stage 5 chronic kidney disease or end stage renal disease: Secondary | ICD-10-CM | POA: Diagnosis not present

## 2018-01-30 DIAGNOSIS — M5136 Other intervertebral disc degeneration, lumbar region: Secondary | ICD-10-CM

## 2018-01-30 MED ORDER — CYCLOBENZAPRINE HCL 10 MG PO TABS: 10.0000 mg | ORAL_TABLET | Freq: Three times a day (TID) | ORAL | 1 refills | Status: DC | PRN

## 2018-01-30 NOTE — Patient Instructions (Addendum)
Use your ear drops: 3 drops into affected ear twice daily for 7 days.   Flexeril is a muscle relaxer. This may make you drowsy.   Apply moist heat to the area. Wet a towel and wring it out so it is damp. Put it in the microwave for about 15-20 seconds - long enough to make it hot, but not too hot to apply to your skin causing burns. Do this for about 20-30 minutes, 3-4 times a day.   Perform gentle, light stretches 2-3 times a day.   Put a tennis ball between your back and a wall. Gentle massage the area with rolling the ball around the affected area. Try a foam roller or trigger point back massager. [facility] on [address] )   Stay well hydrated - try to drink 32-64 oz/day.    Come back and see me if you would like to try a dry needling session. (see below).    Trigger Point Dry Needling   What is Trigger Point Dry Needling (DN)?   1. DN is a physical therapy technique used to treat muscle pain and Dysfunction.  Specifically, DN helps deactivate muscle trigger points (Muscle Knots).   2. A thin filiform needle is used to penetrate the skin and stimulate the underlying trigger point.  The goal is for a local twitch response (LTR) to occur and for the trigger point to relax.  No medication of any kind is injected during the procedure.   What Does Trigger Point Dry Needling Feel Like?   1. The procedures feels different for each individual patient.   Some patients report that they do not actually feel the needle enter the skin and overall the process is not  painful.  Very  mild bleeding may occur.  However, many patients feel a deep cramping in the muscle in which the needle was inserted. This is the local twitch response.    How Will I Feel After The Treatment?   1. Soreness is normal, and the onset of soreness may not occur for a few hours.  Typically this soreness does not last longer than two days.   2. Bruising is uncommon, however; ice can be used to decrease any  possible bruising.   3. In rare cases feeling tired or nauseous after the treatment is normal.  In addition, your symptoms may get worse before they get better, this period will typically not last longer than 24 hours.   What Can I do After My Treatment?   1.  Increase your hydration by drinking more water for the next 24 hours.   2.  You may place ice or heat on the areas treated that have become sore, however don not use heat on inflamed or bruised areas.  Heat often brings more relief post needling.   3. You can continue your regular activities, but vigorous activity is not recommended initially after the treatment for 24 hours.   4. DN is best combined with other physical therapy such as strengthening, stretching, and other therapies.      IF you received an x-ray today, you will receive an invoice from [facility]. Please contact [facility] at [phone] with questions or concerns regarding your invoice.   IF you received labwork today, you will receive an invoice from [place]. Please contact [facility] at [phone] with questions or concerns regarding your invoice.   Our billing staff will not be able to assist you with questions regarding bills from these  companies.  You will be contacted with the lab results as soon as they are available. The fastest way to get your results is to activate your My Chart account. Instructions are located on the last page of this paperwork. If you have not heard from [place] regarding the results in 2 weeks, please contact this office.

## 2018-01-30 NOTE — Progress Notes (Signed)
Alex Williams  MRN: [id number] DOB: 13-Feb-1951  PCP: Mancel Bale, PA-C  Subjective:  Pt is a 67 year old male PMH CKD5, HTN, DDD s/p lumbar spine surgery who presents to clinic for follow-up of otitis externa.  He was here 3 days ago wick placed in right ear, treated with oral and topical antibiotics he is still taking Augmentin.  Is using eardrops as directed.  Denies medication side effects.  The wick came out in his sleep last night and he put a new one back in.  Today he states he is 70% better.  He is also complaining of continued low back pain.  This is a chronic problem for him.  He "could not get out of bed this morning" because the right side of his low back hurts so bad.  "Tramadol does not touch it".  He has used a muscle relaxer in the past which helped.  He is not using heat, stretching, massage.  He has been to physical therapy a long time ago after his surgery.  Review of Systems  Constitutional: Negative for chills, diaphoresis, fatigue and fever.  HENT: Positive for ear pain. Negative for dental problem and ear discharge.   Gastrointestinal: Negative for constipation and diarrhea.  Musculoskeletal: Positive for back pain.    Patient Active Problem List   Diagnosis Date Noted  . Morbid obesity with BMI of 45.0-49.9, adult ([place]) 12/03/2013  . Renal mass 12/02/2013  . Lower extremity edema 03/16/2013  . CKD (chronic kidney disease), stage III ([place]) 12/10/2011  . GOUT 01/30/2010  . Obstructive sleep apnea 01/27/2009  . HYPERCHOLESTEROLEMIA 06/30/2007  . Essential hypertension 06/27/2007  . Osteoarthritis 06/27/2007  . LOW BACK PAIN 06/27/2007  . History of colonic polyps 06/27/2007  . NEPHROLITHIASIS, HX OF 06/27/2007    Current Outpatient Medications on File Prior to Visit  Medication Sig Dispense Refill  . ALPRAZolam (XANAX) 1 MG tablet Take 1 tablet (1 mg total) by mouth at bedtime as needed for anxiety. 30 tablet 0  . amLODipine (NORVASC) 10 MG tablet Take 1  tablet (10 mg total) by mouth daily. 90 tablet 1  . amoxicillin-clavulanate (AUGMENTIN) 500-125 MG tablet Take 1 tablet (500 mg total) by mouth daily. 7 tablet 0  . aspirin 81 MG tablet Take 81 mg by mouth daily.      . cetirizine (ZYRTEC) 10 MG tablet Take 10 mg by mouth daily. Reported on 08/07/2015    . ciprofloxacin-hydrocortisone (CIPRO HC OTIC) OTIC suspension Place 3 drops into the right ear 2 (two) times daily. 10 mL 0  . furosemide (LASIX) 40 MG tablet Take 1 tablet (40 mg total) by mouth 2 (two) times daily. 120 tablet 1  . Multiple Vitamins-Minerals (ONE-A-DAY MENS 50+ ADVANTAGE PO) Take 1 tablet by mouth daily.    . rosuvastatin (CRESTOR) 5 MG tablet Take 1 tablet (5 mg total) by mouth daily. 90 tablet 2  . traMADol (ULTRAM) 50 MG tablet TAKE 1 TABLET BY MOUTH EVERY 8 HOURS AS NEEDED 30 tablet 2  . ULORIC 40 MG tablet Take 40 mg by mouth daily.  6  . metoprolol succinate (TOPROL-XL) 50 MG 24 hr tablet Take 1 tablet (50 mg total) by mouth every morning. Take with or immediately following a meal. 90 tablet 1  . sodium bicarbonate 650 MG tablet TAKE 1 TABLET TWICE A DAY (Patient not taking: Reported on 01/27/2018) 60 tablet 0   No current facility-administered medications on file prior to visit.     Allergies  Allergen Reactions  . Codeine Phosphate      Objective:  BP 136/86 (BP Location: Left Arm, Patient Position: Sitting, Cuff Size: Large)   Pulse 94   Temp 98.3 F (36.8 C) (Oral)   Resp 16   Ht 5\' 11"  (1.803 m)   Wt (!) 350 lb 3.2 oz (158.8 kg)   SpO2 96%   BMI 48.84 kg/m   Physical Exam  Constitutional: He is oriented to person, place, and time. He appears well-developed and well-nourished.  HENT:  Right Ear: Hearing and tympanic membrane normal.  Left Ear: There is swelling (external canal) and tenderness. No mastoid tenderness.  Unable to visualize TM due to canal swelling. Mild to moderate tenderness on exam.   Cardiovascular: Normal rate and regular rhythm.    Neurological: He is alert and oriented to person, place, and time.  Skin: Skin is warm and dry.  Psychiatric: He has a normal mood and affect. His behavior is normal. Judgment and thought content normal.  Vitals reviewed.   Assessment and Plan :  1. Otitis externa of right ear, unspecified chronicity, unspecified type -Patient presents for follow-up otitis externa of his right ear.  He is taking antibiotics orally and topically as directed.  Wick placed in the canal as there is still swelling.  Advised patient he may take this out in 24 to 48 hours as long as he is still improving.  Return to clinic in 5 to 7 days for recheck.  2. Chronic right-sided low back pain without sciatica -Acute on chronic low back pain.  This is a chronic problem for this patient.  He denies physical therapy referral.  Will try Flexeril, heat, massage.  Return to clinic if no improvement. - cyclobenzaprine (FLEXERIL) 10 MG tablet; Take 1 tablet (10 mg total) by mouth 3 (three) times daily as needed for muscle spasms.  Dispense: 30 tablet; Refill: 1   Alex Temekia Caskey, PA-C  [facility] 01/30/2018 10:25 AM

## 2018-02-04 ENCOUNTER — Ambulatory Visit: Payer: Medicare Other | Admitting: Physician Assistant

## 2018-02-04 ENCOUNTER — Other Ambulatory Visit: Payer: Self-pay

## 2018-02-04 ENCOUNTER — Encounter: Payer: Self-pay | Admitting: Physician Assistant

## 2018-02-04 ENCOUNTER — Telehealth: Payer: Self-pay | Admitting: Physician Assistant

## 2018-02-04 VITALS — BP 140/90 | HR 88 | Temp 98.9°F | Resp 16 | Ht 71.0 in | Wt 348.0 lb

## 2018-02-04 DIAGNOSIS — M545 Low back pain, unspecified: Secondary | ICD-10-CM

## 2018-02-04 MED ORDER — KETOROLAC TROMETHAMINE 60 MG/2ML IM SOLN: 60.0000 mg | Freq: Once | INTRAMUSCULAR | Status: DC

## 2018-02-04 MED ORDER — OXYCODONE-ACETAMINOPHEN 10-325 MG PO TABS: 1.0000 | ORAL_TABLET | Freq: Three times a day (TID) | ORAL | 0 refills | Status: DC | PRN

## 2018-02-04 NOTE — Telephone Encounter (Signed)
Left detailed message to come back in for an appointment crm in

## 2018-02-04 NOTE — Progress Notes (Signed)
Alex Williams  MRN: [id number] DOB: 1950-10-10  PCP: Mancel Bale, PA-C  Subjective:  Pt is a 67 year old male who  has a past medical history of Cervical disc disease, Chronic kidney disease, COLONIC POLYPS, HX OF (06/27/2007), EXOGENOUS OBESITY (01/30/2010), GOUT (01/30/2010), HYPERCHOLESTEROLEMIA (06/30/2007), HYPERTENSION (06/27/2007), LOW BACK PAIN (06/27/2007), NEPHROLITHIASIS, HX OF (06/27/2007), OSTEOARTHRITIS (06/27/2007), and SLEEP APNEA, OBSTRUCTIVE, MODERATE (01/27/2009). who presents to clinic for back pain.  Pain is of his lower back.  Pain is worse when sitting for long time or laying for long time.  Pain is causing reduced range of motion. He picked up a 40lb bag of cement 3 weeks ago.  He has been taking Flexeril - not helping.  Denies urinary or fecal incontinence, saddle paresthesia, numbness, tingling, abdomen  Review of Systems  Gastrointestinal: Negative for abdominal pain, constipation and diarrhea.  Musculoskeletal: Positive for back pain.  Neurological: Negative for weakness and numbness.    Patient Active Problem List   Diagnosis Date Noted  . Morbid obesity with BMI of 45.0-49.9, adult ([place]) 12/03/2013  . Renal mass 12/02/2013  . Lower extremity edema 03/16/2013  . CKD (chronic kidney disease), stage III ([place]) 12/10/2011  . GOUT 01/30/2010  . Obstructive sleep apnea 01/27/2009  . HYPERCHOLESTEROLEMIA 06/30/2007  . Essential hypertension 06/27/2007  . Osteoarthritis 06/27/2007  . LOW BACK PAIN 06/27/2007  . History of colonic polyps 06/27/2007  . NEPHROLITHIASIS, HX OF 06/27/2007    Current Outpatient Medications on File Prior to Visit  Medication Sig Dispense Refill  . ALPRAZolam (XANAX) 1 MG tablet Take 1 tablet (1 mg total) by mouth at bedtime as needed for anxiety. 30 tablet 0  . amLODipine (NORVASC) 10 MG tablet Take 1 tablet (10 mg total) by mouth daily. 90 tablet 1  . aspirin 81 MG tablet Take 81 mg by mouth daily.      . cetirizine (ZYRTEC) 10 MG  tablet Take 10 mg by mouth daily. Reported on 08/07/2015    . ciprofloxacin-hydrocortisone (CIPRO HC OTIC) OTIC suspension Place 3 drops into the right ear 2 (two) times daily. 10 mL 0  . cyclobenzaprine (FLEXERIL) 10 MG tablet Take 1 tablet (10 mg total) by mouth 3 (three) times daily as needed for muscle spasms. 30 tablet 1  . furosemide (LASIX) 40 MG tablet Take 1 tablet (40 mg total) by mouth 2 (two) times daily. 120 tablet 1  . Multiple Vitamins-Minerals (ONE-A-DAY MENS 50+ ADVANTAGE PO) Take 1 tablet by mouth daily.    . rosuvastatin (CRESTOR) 5 MG tablet Take 1 tablet (5 mg total) by mouth daily. 90 tablet 2  . traMADol (ULTRAM) 50 MG tablet TAKE 1 TABLET BY MOUTH EVERY 8 HOURS AS NEEDED 30 tablet 2  . ULORIC 40 MG tablet Take 40 mg by mouth daily.  6  . sodium bicarbonate 650 MG tablet TAKE 1 TABLET TWICE A DAY (Patient not taking: Reported on 02/04/2018) 60 tablet 0   No current facility-administered medications on file prior to visit.     Allergies  Allergen Reactions  . Codeine Phosphate      Objective:  BP 140/90 (BP Location: Left Arm, Patient Position: Sitting, Cuff Size: Large)   Pulse 88   Temp 98.9 F (37.2 C) (Oral)   Resp 16   Ht 5\' 11"  (1.803 m)   Wt (!) 348 lb (157.9 kg)   SpO2 97%   BMI 48.54 kg/m   Physical Exam  Constitutional: He is oriented to person, place, and  time. He appears well-developed and well-nourished.  Musculoskeletal:       Lumbar back: He exhibits decreased range of motion and tenderness. He exhibits no bony tenderness.       Back:  Neurological: He is alert and oriented to person, place, and time.  Skin: Skin is warm and dry.  Psychiatric: He has a normal mood and affect. His behavior is normal. Judgment and thought content normal.  Vitals reviewed.   A trigger point injection was performed at the site of maximal tenderness. This was well tolerated, and followed by moderate relief of pain.  Assessment and Plan :     Mercer Pod,  PA-C  [facility] 02/04/2018 4:51 PM

## 2018-02-04 NOTE — Patient Instructions (Addendum)
Please take Percocet as directed. This will make you drowsy.  You will receive a phone call to schedule an appointment with physical therapy.   Apply moist heat to the area. Wet a towel and wring it out so it is damp. Put it in the microwave for about 15-20 seconds - long enough to make it hot, but not too hot to apply to your skin causing burns. Do this for about 20-30 minutes, 3-4 times a day.   Perform gentle, light stretches 2-3 times a day.   Put a tennis ball between your back and a wall. Gentle massage the area with rolling the ball around the affected area. Try a foam roller.   Stay well hydrated - try to drink 32-64 oz/day.    Trigger Point Dry Needling   What is Trigger Point Dry Needling (DN)?   1. DN is a physical therapy technique used to treat muscle pain and Dysfunction.  Specifically, DN helps deactivate muscle trigger points (Muscle Knots).   2. A thin filiform needle is used to penetrate the skin and stimulate the underlying trigger point.  The goal is for a local twitch response (LTR) to occur and for the trigger point to relax.  No medication of any kind is injected during the procedure.   What Does Trigger Point Dry Needling Feel Like?   1. The procedures feels different for each individual patient.   Some patients report that they do not actually feel the needle enter the skin and overall the process is not  painful.  Very  mild bleeding may occur.  However, many patients feel a deep cramping in the muscle in which the needle was inserted. This is the local twitch response.    How Will I Feel After The Treatment?   1. Soreness is normal, and the onset of soreness may not occur for a few hours.  Typically this soreness does not last longer than two days.   2. Bruising is uncommon, however; ice can be used to decrease any possible bruising.   3. In rare cases feeling tired or nauseous after the treatment is normal.  In addition, your symptoms may get worse before they  get better, this period will typically not last longer than 24 hours.   What Can I do After My Treatment?   1.  Increase your hydration by drinking more water for the next 24 hours.   2.  You may place ice or heat on the areas treated that have become sore, however don not use heat on inflamed or bruised areas.  Heat often brings more relief post needling.   3. You can continue your regular activities, but vigorous activity is not recommended initially after the treatment for 24 hours.   4. DN is best combined with other physical therapy such as strengthening, stretching, and other therapies.    IF you received an x-ray today, you will receive an invoice from [facility]. Please contact [facility] at [phone] with questions or concerns regarding your invoice.   IF you received labwork today, you will receive an invoice from [place]. Please contact [facility] at [phone] with questions or concerns regarding your invoice.   Our billing staff will not be able to assist you with questions regarding bills from these companies.  You will be contacted with the lab results as soon as they are available. The fastest way to get your results is to activate your My Chart account. Instructions are located on the last page  of this paperwork. If you have not heard from [place] regarding the results in 2 weeks, please contact this office.      Acetaminophen; Oxycodone tablets What is this medicine? ACETAMINOPHEN; OXYCODONE (a set a MEE noe fen; ox i KOE done) is a pain reliever. It is used to treat moderate to severe pain. This medicine may be used for other purposes; ask your health care provider or pharmacist if you have questions. COMMON BRAND NAME(S): Endocet, Magnacet, Narvox, Percocet, Perloxx, Primalev, Primlev, Roxicet, Xolox What should I tell my health care provider before I take this medicine? They need to know if you have any of these conditions: -brain tumor -Crohn's  disease, inflammatory bowel disease, or ulcerative colitis -drug abuse or addiction -head injury -heart or circulation problems -if you often drink alcohol -kidney disease or problems going to the bathroom -liver disease -lung disease, asthma, or breathing problems -an unusual or allergic reaction to acetaminophen, oxycodone, other opioid analgesics, other medicines, foods, dyes, or preservatives -pregnant or trying to get pregnant -breast-feeding How should I use this medicine? Take this medicine by mouth with a full glass of water. Follow the directions on the prescription label. You can take it with or without food. If it upsets your stomach, take it with food. Take your medicine at regular intervals. Do not take it more often than directed. A special MedGuide will be given to you by the pharmacist with each prescription and refill. Be sure to read this information carefully each time. Talk to your pediatrician regarding the use of this medicine in children. Special care may be needed. Overdosage: If you think you have taken too much of this medicine contact a poison control center or emergency room at once. NOTE: This medicine is only for you. Do not share this medicine with others. What if I miss a dose? If you miss a dose, take it as soon as you can. If it is almost time for your next dose, take only that dose. Do not take double or extra doses. What may interact with this medicine? This medicine may interact with the following medications: -alcohol -antihistamines for allergy, cough and cold -antiviral medicines for HIV or AIDS -atropine -certain antibiotics like clarithromycin, erythromycin, linezolid, rifampin -certain medicines for anxiety or sleep -certain medicines for bladder problems like oxybutynin, tolterodine -certain medicines for depression like amitriptyline, fluoxetine, sertraline -certain medicines for fungal infections like ketoconazole, itraconazole,  voriconazole -certain medicines for migraine headache like almotriptan, eletriptan, frovatriptan, naratriptan, rizatriptan, sumatriptan, zolmitriptan -certain medicines for nausea or vomiting like dolasetron, ondansetron, palonosetron -certain medicines for Parkinson's disease like benztropine, trihexyphenidyl -certain medicines for seizures like phenobarbital, phenytoin, primidone -certain medicines for stomach problems like dicyclomine, hyoscyamine -certain medicines for travel sickness like scopolamine -diuretics -general anesthetics like halothane, isoflurane, methoxyflurane, propofol -ipratropium -local anesthetics like lidocaine, pramoxine, tetracaine -MAOIs like Carbex, Eldepryl, Marplan, Nardil, and Parnate -medicines that relax muscles for surgery -methylene blue -nilotinib -other medicines with acetaminophen -other narcotic medicines for pain or cough -phenothiazines like chlorpromazine, mesoridazine, prochlorperazine, thioridazine This list may not describe all possible interactions. Give your health care provider a list of all the medicines, herbs, non-prescription drugs, or dietary supplements you use. Also tell them if you smoke, drink alcohol, or use illegal drugs. Some items may interact with your medicine. What should I watch for while using this medicine? Tell your doctor or health care professional if your pain does not go away, if it gets worse, or if you have new or a different  type of pain. You may develop tolerance to the medicine. Tolerance means that you will need a higher dose of the medication for pain relief. Tolerance is normal and is expected if you take this medicine for a long time. Do not suddenly stop taking your medicine because you may develop a severe reaction. Your body becomes used to the medicine. This does NOT mean you are addicted. Addiction is a behavior related to getting and using a drug for a non-medical reason. If you have pain, you have a medical  reason to take pain medicine. Your doctor will tell you how much medicine to take. If your doctor wants you to stop the medicine, the dose will be slowly lowered over time to avoid any side effects. There are different types of narcotic medicines (opiates). If you take more than one type at the same time or if you are taking another medicine that also causes drowsiness, you may have more side effects. Give your health care provider a list of all medicines you use. Your doctor will tell you how much medicine to take. Do not take more medicine than directed. Call emergency for help if you have problems breathing or unusual sleepiness. Do not take other medicines that contain acetaminophen with this medicine. Always read labels carefully. If you have questions, ask your doctor or pharmacist. If you take too much acetaminophen get medical help right away. Too much acetaminophen can be very dangerous and cause liver damage. Even if you do not have symptoms, it is important to get help right away. You may get drowsy or dizzy. Do not drive, use machinery, or do anything that needs mental alertness until you know how this medicine affects you. Do not stand or sit up quickly, especially if you are an older patient. This reduces the risk of dizzy or fainting spells. Alcohol may interfere with the effect of this medicine. Avoid alcoholic drinks. The medicine will cause constipation. Try to have a bowel movement at least every 2 to 3 days. If you do not have a bowel movement for 3 days, call your doctor or health care professional. Your mouth may get dry. Chewing sugarless gum or sucking hard candy, and drinking plenty or water may help. Contact your doctor if the problem does not go away or is severe. What side effects may I notice from receiving this medicine? Side effects that you should report to your doctor or health care professional as soon as possible: -allergic reactions like skin rash, itching or hives,  swelling of the face, lips, or tongue -breathing problems -confusion -redness, blistering, peeling or loosening of the skin, including inside the mouth -signs and symptoms of liver injury like dark yellow or brown urine; general ill feeling or flu-like symptoms; light-colored stools; loss of appetite; nausea; right upper belly pain; unusually weak or tired; yellowing of the eyes or skin -signs and symptoms of low blood pressure like dizziness; feeling faint or lightheaded, falls; unusually weak or tired -trouble passing urine or change in the amount of urine Side effects that usually do not require medical attention (report to your doctor or health care professional if they continue or are bothersome): -constipation -dry mouth -nausea, vomiting -tiredness This list may not describe all possible side effects. Call your doctor for medical advice about side effects. You may report side effects to FDA at 1-800-FDA-1088. Where should I keep my medicine? Keep out of the reach of children. This medicine can be abused. Keep your medicine in a safe  place to protect it from theft. Do not share this medicine with anyone. Selling or giving away this medicine is dangerous and against the law. This medicine may cause accidental overdose and death if it taken by other adults, children, or pets. Mix any unused medicine with a substance like cat litter or coffee grounds. Then throw the medicine away in a sealed container like a sealed bag or a coffee can with a lid. Do not use the medicine after the expiration date. Store at room temperature between 20 and 25 degrees C (68 and 77 degrees F). NOTE: This sheet is a summary. It may not cover all possible information. If you have questions about this medicine, talk to your doctor, pharmacist, or health care provider.  2018 Elsevier/Gold Standard (2015-04-04 21:48:12)    IF you received an x-ray today, you will receive an invoice from [facility]. Please  contact [facility] at [phone] with questions or concerns regarding your invoice.   IF you received labwork today, you will receive an invoice from [place]. Please contact [facility] at [phone] with questions or concerns regarding your invoice.   Our billing staff will not be able to assist you with questions regarding bills from these companies.  You will be contacted with the lab results as soon as they are available. The fastest way to get your results is to activate your My Chart account. Instructions are located on the last page of this paperwork. If you have not heard from [place] regarding the results in 2 weeks, please contact this office.

## 2018-02-04 NOTE — Telephone Encounter (Signed)
Please call pharmacy to confirm whether oxyCODONE-acetaminophen (PERCOCET) 10-325 MG tablet is ok to take with severe kidney illness and opioide allergy.

## 2018-02-04 NOTE — Telephone Encounter (Signed)
Copied from [place] [id number]. Topic: Quick Communication - See Telephone Encounter >> Feb 04, 2018 10:27 AM Neva Seat wrote: Pt is still having severe back pain.  Pt was told to call back if he continued to have pain.

## 2018-02-06 NOTE — Telephone Encounter (Signed)
Please advise 

## 2018-02-07 ENCOUNTER — Telehealth: Payer: Self-pay | Admitting: Physician Assistant

## 2018-02-07 DIAGNOSIS — N2581 Secondary hyperparathyroidism of renal origin: Secondary | ICD-10-CM | POA: Diagnosis not present

## 2018-02-07 DIAGNOSIS — D631 Anemia in chronic kidney disease: Secondary | ICD-10-CM | POA: Diagnosis not present

## 2018-02-07 DIAGNOSIS — N281 Cyst of kidney, acquired: Secondary | ICD-10-CM | POA: Diagnosis not present

## 2018-02-07 DIAGNOSIS — I129 Hypertensive chronic kidney disease with stage 1 through stage 4 chronic kidney disease, or unspecified chronic kidney disease: Secondary | ICD-10-CM | POA: Diagnosis not present

## 2018-02-07 DIAGNOSIS — N184 Chronic kidney disease, stage 4 (severe): Secondary | ICD-10-CM | POA: Diagnosis not present

## 2018-02-07 NOTE — Telephone Encounter (Signed)
Copied from [place] [id number]. Topic: Quick Communication - See Telephone Encounter >> Feb 07, 2018 11:17 AM Conception Chancy, NT wrote: CRM for notification. See Telephone encounter for: 02/07/18.  Patient is calling and states that he seen his Kidney doctor today and was told that it is okay to fill oxyCODONE-acetaminophen (PERCOCET) 10-325 MG tablet since it was a narcotic. Patient states the pharmacy will not fill it until the provider calls and gives okay.   [id number] - [place], [place] [address]. [place] [address] Phone: [phone] Fax: [phone]

## 2018-02-07 NOTE — Telephone Encounter (Signed)
Tylenol should be fine with kidney disease.  He only has an allergy to codeine from our records.  I would probably suggest a percocet 5 in case the excretion is low due to his kidney disease.  But I did not Rx this.

## 2018-02-11 ENCOUNTER — Other Ambulatory Visit: Payer: Self-pay

## 2018-02-11 ENCOUNTER — Encounter: Payer: Self-pay | Admitting: Physician Assistant

## 2018-02-11 ENCOUNTER — Ambulatory Visit: Payer: Medicare Other | Admitting: Physician Assistant

## 2018-02-11 VITALS — BP 134/70 | HR 94 | Temp 99.2°F | Resp 18 | Ht 71.0 in | Wt 344.4 lb

## 2018-02-11 DIAGNOSIS — N185 Chronic kidney disease, stage 5: Secondary | ICD-10-CM | POA: Diagnosis not present

## 2018-02-11 DIAGNOSIS — N183 Chronic kidney disease, stage 3 (moderate): Secondary | ICD-10-CM | POA: Diagnosis not present

## 2018-02-11 DIAGNOSIS — Z6841 Body Mass Index (BMI) 40.0 and over, adult: Secondary | ICD-10-CM

## 2018-02-11 DIAGNOSIS — I10 Essential (primary) hypertension: Secondary | ICD-10-CM

## 2018-02-11 MED ORDER — AMLODIPINE BESYLATE 10 MG PO TABS: 10.0000 mg | ORAL_TABLET | Freq: Every day | ORAL | 3 refills | Status: DC

## 2018-02-11 MED ORDER — SODIUM BICARBONATE 650 MG PO TABS: 650.0000 mg | ORAL_TABLET | Freq: Two times a day (BID) | ORAL | 4 refills | Status: DC

## 2018-02-11 NOTE — Progress Notes (Signed)
Alex Williams  MRN: [id number] DOB: 02/06/1951  PCP: Mancel Bale, PA-C  Chief Complaint  Patient presents with  . Hypertension    follow up     Subjective:  Pt presents to clinic for recheck of his HTN.  He is working with his diet and has lost some weight.  He still sees his kidney specialist. He has noticed that his Coreg is still causing him to be cold at night - he takes it at night.  He was switched to this from metoprolol and it improved but did not resolve.  He otherwise feels good.   He has tried to make some changes in his diet to improve his overall health.  He has noticed some weight change.  History is obtained by patient.  Review of Systems  Constitutional: Negative for chills and fever.  Eyes: Negative for visual disturbance.  Respiratory: Negative for cough and shortness of breath.   Cardiovascular: Negative for chest pain, palpitations and leg swelling.  Neurological: Negative for dizziness, light-headedness and headaches.    Patient Active Problem List   Diagnosis Date Noted  . Morbid obesity with BMI of 45.0-49.9, adult ([place]) 12/03/2013  . Renal mass 12/02/2013  . Lower extremity edema 03/16/2013  . CKD (chronic kidney disease), stage III ([place]) 12/10/2011  . GOUT 01/30/2010  . Obstructive sleep apnea 01/27/2009  . HYPERCHOLESTEROLEMIA 06/30/2007  . Essential hypertension 06/27/2007  . Osteoarthritis 06/27/2007  . LOW BACK PAIN 06/27/2007  . History of colonic polyps 06/27/2007  . NEPHROLITHIASIS, HX OF 06/27/2007    Current Outpatient Medications on File Prior to Visit  Medication Sig Dispense Refill  . aspirin 81 MG tablet Take 81 mg by mouth daily.      . carvedilol (COREG) 25 MG tablet Take 25 mg by mouth daily.  3  . cetirizine (ZYRTEC) 10 MG tablet Take 10 mg by mouth daily. Reported on 08/07/2015    . cyclobenzaprine (FLEXERIL) 10 MG tablet Take 1 tablet (10 mg total) by mouth 3 (three) times daily as needed for muscle spasms. 30 tablet 1    . furosemide (LASIX) 40 MG tablet Take 1 tablet (40 mg total) by mouth 2 (two) times daily. (Patient taking differently: Take 40 mg by mouth daily. ) 120 tablet 1  . Multiple Vitamins-Minerals (ONE-A-DAY MENS 50+ ADVANTAGE PO) Take 1 tablet by mouth daily.    Marland Kitchen oxyCODONE-acetaminophen (PERCOCET) 10-325 MG tablet Take 1 tablet by mouth every 8 (eight) hours as needed for pain. 20 tablet 0  . rosuvastatin (CRESTOR) 5 MG tablet Take 1 tablet (5 mg total) by mouth daily. 90 tablet 2  . ULORIC 40 MG tablet Take 40 mg by mouth every other day.   6   No current facility-administered medications on file prior to visit.     Allergies  Allergen Reactions  . Codeine Phosphate     Past Medical History:  Diagnosis Date  . Cervical disc disease   . Chronic kidney disease   . COLONIC POLYPS, HX OF 06/27/2007  . EXOGENOUS OBESITY 01/30/2010  . GOUT 01/30/2010  . HYPERCHOLESTEROLEMIA 06/30/2007  . HYPERTENSION 06/27/2007  . LOW BACK PAIN 06/27/2007  . NEPHROLITHIASIS, HX OF 06/27/2007  . OSTEOARTHRITIS 06/27/2007  . SLEEP APNEA, OBSTRUCTIVE, MODERATE 01/27/2009   Social History   Social History Narrative  . Not on file   Social History   Tobacco Use  . Smoking status: Former Smoker    Last attempt to quit: 07/23/1980    Years  since quitting: 37.5  . Smokeless tobacco: Never Used  Substance Use Topics  . Alcohol use: Yes  . Drug use: No   family history includes Diabetes in his sister; Hyperlipidemia in his sister; Hypertension in his other; Sleep apnea in his other.     Objective:  BP 134/70   Pulse 94   Temp 99.2 F (37.3 C) (Oral)   Resp 18   Ht 5' 11"  (1.803 m)   Wt (!) 344 lb 6.4 oz (156.2 kg)   SpO2 100%   BMI 48.03 kg/m  Body mass index is 48.03 kg/m.  Wt Readings from Last 3 Encounters:  02/11/18 (!) 344 lb 6.4 oz (156.2 kg)  02/04/18 (!) 348 lb (157.9 kg)  01/30/18 (!) 350 lb 3.2 oz (158.8 kg)    Physical Exam  Constitutional: He is oriented to person, place, and  time.  HENT:  Head: Normocephalic and atraumatic.  Right Ear: External ear normal.  Left Ear: External ear normal.  Eyes: Conjunctivae are normal.  Neck: Normal range of motion.  Cardiovascular: Normal rate, regular rhythm, normal heart sounds and intact distal pulses.  Pulmonary/Chest: Effort normal and breath sounds normal. He has no wheezes.  Musculoskeletal:       Right lower leg: He exhibits edema.       Left lower leg: He exhibits edema.  Neurological: He is alert and oriented to person, place, and time.  Skin: Skin is warm and dry.  Psychiatric: Judgment normal.    Assessment and Plan :  CKD (chronic kidney disease) stage 5, GFR less than 15 ml/min (HCC) - Plan: CMP14+EGFR - check labs  Essential hypertension - Plan: CMP14+EGFR, amLODipine (NORVASC) 10 MG tablet - good control - for now he will switch his coreg to in the am and Norvasc at night to see if that reduces his temperature problems in the evening.  Did discuss with patient that this seems to be a side effect that he could deal with but we will see if this changes his symptoms.  If he feels that this changes and he does not want to deal with it we could perhaps try bisoprolol.  His blood pressure is well controlled like to make any changes.  Stage 3 chronic kidney disease ([place]) - Plan: sodium bicarbonate 650 MG tablet continue this-   Patient verbalized to me that they understand the following: diagnosis, what is being done for them, what to expect and what should be done at home.  Their questions have been answered.  See after visit summary for patient specific instructions.  Windell Hummingbird PA-C  [facility] 02/11/2018 11:34 AM  Please note: Portions of this report may have been transcribed using dragon voice recognition software. Every effort was made to ensure accuracy; however, inadvertent computerized transcription errors may be present.

## 2018-02-11 NOTE — Patient Instructions (Addendum)
  Try the coreg in the am and the amlodipine in the pm to see if it helps your cold at night  Let me know in 2 weeks if this helps with the cold at night - if it does not think about whether you want to change the medication to another one in that class.  [place], [place], [place] [address] Phone: [phone]   IF you received an x-ray today, you will receive an invoice from [facility]. Please contact [facility] at [phone] with questions or concerns regarding your invoice.   IF you received labwork today, you will receive an invoice from [place]. Please contact [facility] at [phone] with questions or concerns regarding your invoice.   Our billing staff will not be able to assist you with questions regarding bills from these companies.  You will be contacted with the lab results as soon as they are available. The fastest way to get your results is to activate your My Chart account. Instructions are located on the last page of this paperwork. If you have not heard from [place] regarding the results in 2 weeks, please contact this office.

## 2018-02-12 LAB — CMP14+EGFR
A/G RATIO: 1.6 (ref 1.2–2.2)
ALT: 8 IU/L (ref 0–44)
AST: 10 IU/L (ref 0–40)
Albumin: 3.9 g/dL (ref 3.6–4.8)
Alkaline Phosphatase: 94 IU/L (ref 39–117)
BILIRUBIN TOTAL: 0.4 mg/dL (ref 0.0–1.2)
BUN/Creatinine Ratio: 13 (ref 10–24)
BUN: 68 mg/dL — AB (ref 8–27)
CALCIUM: 9.3 mg/dL (ref 8.6–10.2)
CHLORIDE: 109 mmol/L — AB (ref 96–106)
CO2: 14 mmol/L — CL (ref 20–29)
CREATININE: 5.3 mg/dL — AB (ref 0.76–1.27)
GFR calc Af Amer: 12 mL/min/{1.73_m2} — ABNORMAL LOW (ref >59)
GFR calc non Af Amer: 10 mL/min/{1.73_m2} — ABNORMAL LOW (ref >59)
GLOBULIN, TOTAL: 2.4 g/dL (ref 1.5–4.5)
Glucose: 92 mg/dL (ref 65–99)
Potassium: 4.8 mmol/L (ref 3.5–5.2)
Sodium: 141 mmol/L (ref 134–144)
Total Protein: 6.3 g/dL (ref 6.0–8.5)

## 2018-02-12 NOTE — Telephone Encounter (Signed)
Please Advise

## 2018-02-13 DIAGNOSIS — N185 Chronic kidney disease, stage 5: Secondary | ICD-10-CM

## 2018-02-13 DIAGNOSIS — N179 Acute kidney failure, unspecified: Secondary | ICD-10-CM | POA: Insufficient documentation

## 2018-02-13 NOTE — Addendum Note (Signed)
Addended by: Mancel Bale on: 02/13/2018 08:04 AM   Modules accepted: Orders

## 2018-02-13 NOTE — Telephone Encounter (Signed)
This has already been taken care of

## 2018-02-19 ENCOUNTER — Other Ambulatory Visit: Payer: Self-pay | Admitting: Physician Assistant

## 2018-02-19 DIAGNOSIS — I10 Essential (primary) hypertension: Secondary | ICD-10-CM

## 2018-02-19 NOTE — Telephone Encounter (Signed)
Patient is requesting a refill of the following medications: Requested Prescriptions   Pending Prescriptions Disp Refills  . metoprolol succinate (TOPROL-XL) 50 MG 24 hr tablet [Pharmacy Med Name: METOPROLOL SUCC ER 50 MG TAB] 90 tablet 1    Sig: TAKE 1 TABLET (50 MG TOTAL) BY MOUTH EVERY MORNING. TAKE WITH OR IMMEDIATELY FOLLOWING A MEAL.    Date of patient request: 02/19/2018 Last office visit: 02/11/2018 Date of last refill: Last refill amount: Follow up time period per chart:

## 2018-05-13 ENCOUNTER — Ambulatory Visit: Payer: Medicare Other | Admitting: Physician Assistant

## 2018-07-14 ENCOUNTER — Ambulatory Visit: Payer: Medicare Other | Admitting: Family Medicine

## 2018-08-25 ENCOUNTER — Encounter (HOSPITAL_COMMUNITY): Payer: Self-pay | Admitting: *Deleted

## 2018-08-25 ENCOUNTER — Encounter: Payer: Self-pay | Admitting: Family Medicine

## 2018-08-25 ENCOUNTER — Ambulatory Visit (INDEPENDENT_AMBULATORY_CARE_PROVIDER_SITE_OTHER): Payer: Medicare Other | Admitting: Family Medicine

## 2018-08-25 ENCOUNTER — Encounter

## 2018-08-25 ENCOUNTER — Other Ambulatory Visit: Payer: Self-pay

## 2018-08-25 ENCOUNTER — Ambulatory Visit (INDEPENDENT_AMBULATORY_CARE_PROVIDER_SITE_OTHER): Payer: Medicare Other

## 2018-08-25 ENCOUNTER — Inpatient Hospital Stay (HOSPITAL_COMMUNITY)
Admission: EM | Admit: 2018-08-25 | Discharge: 2018-08-28 | DRG: 682 | Disposition: A | Discharge status: Home / Self Care | Payer: Medicare Other | Attending: Internal Medicine | Admitting: Internal Medicine

## 2018-08-25 VITALS — BP 160/94 | HR 94 | Temp 97.8°F | Ht 71.0 in | Wt 342.0 lb

## 2018-08-25 DIAGNOSIS — Z79899 Other long term (current) drug therapy: Secondary | ICD-10-CM

## 2018-08-25 DIAGNOSIS — D631 Anemia in chronic kidney disease: Secondary | ICD-10-CM | POA: Diagnosis present

## 2018-08-25 DIAGNOSIS — Z87442 Personal history of urinary calculi: Secondary | ICD-10-CM

## 2018-08-25 DIAGNOSIS — N179 Acute kidney failure, unspecified: Principal | ICD-10-CM | POA: Diagnosis present

## 2018-08-25 DIAGNOSIS — I10 Essential (primary) hypertension: Secondary | ICD-10-CM | POA: Diagnosis not present

## 2018-08-25 DIAGNOSIS — J810 Acute pulmonary edema: Secondary | ICD-10-CM | POA: Diagnosis not present

## 2018-08-25 DIAGNOSIS — Z96652 Presence of left artificial knee joint: Secondary | ICD-10-CM | POA: Diagnosis present

## 2018-08-25 DIAGNOSIS — I120 Hypertensive chronic kidney disease with stage 5 chronic kidney disease or end stage renal disease: Secondary | ICD-10-CM | POA: Diagnosis not present

## 2018-08-25 DIAGNOSIS — R0602 Shortness of breath: Secondary | ICD-10-CM

## 2018-08-25 DIAGNOSIS — Z8249 Family history of ischemic heart disease and other diseases of the circulatory system: Secondary | ICD-10-CM

## 2018-08-25 DIAGNOSIS — E8770 Fluid overload, unspecified: Secondary | ICD-10-CM | POA: Diagnosis not present

## 2018-08-25 DIAGNOSIS — J90 Pleural effusion, not elsewhere classified: Secondary | ICD-10-CM | POA: Diagnosis present

## 2018-08-25 DIAGNOSIS — Z885 Allergy status to narcotic agent status: Secondary | ICD-10-CM

## 2018-08-25 DIAGNOSIS — G4733 Obstructive sleep apnea (adult) (pediatric): Secondary | ICD-10-CM | POA: Diagnosis present

## 2018-08-25 DIAGNOSIS — Z8349 Family history of other endocrine, nutritional and metabolic diseases: Secondary | ICD-10-CM

## 2018-08-25 DIAGNOSIS — R7989 Other specified abnormal findings of blood chemistry: Secondary | ICD-10-CM | POA: Diagnosis not present

## 2018-08-25 DIAGNOSIS — Z86010 Personal history of colonic polyps: Secondary | ICD-10-CM

## 2018-08-25 DIAGNOSIS — E872 Acidosis: Secondary | ICD-10-CM | POA: Diagnosis present

## 2018-08-25 DIAGNOSIS — Z7982 Long term (current) use of aspirin: Secondary | ICD-10-CM

## 2018-08-25 DIAGNOSIS — N185 Chronic kidney disease, stage 5: Secondary | ICD-10-CM | POA: Diagnosis present

## 2018-08-25 DIAGNOSIS — R9431 Abnormal electrocardiogram [ECG] [EKG]: Secondary | ICD-10-CM | POA: Insufficient documentation

## 2018-08-25 DIAGNOSIS — Z833 Family history of diabetes mellitus: Secondary | ICD-10-CM

## 2018-08-25 DIAGNOSIS — J811 Chronic pulmonary edema: Secondary | ICD-10-CM | POA: Diagnosis present

## 2018-08-25 DIAGNOSIS — Z87891 Personal history of nicotine dependence: Secondary | ICD-10-CM

## 2018-08-25 DIAGNOSIS — Z79891 Long term (current) use of opiate analgesic: Secondary | ICD-10-CM

## 2018-08-25 DIAGNOSIS — Z9989 Dependence on other enabling machines and devices: Secondary | ICD-10-CM

## 2018-08-25 DIAGNOSIS — E785 Hyperlipidemia, unspecified: Secondary | ICD-10-CM | POA: Diagnosis present

## 2018-08-25 DIAGNOSIS — E7800 Pure hypercholesterolemia, unspecified: Secondary | ICD-10-CM | POA: Diagnosis present

## 2018-08-25 DIAGNOSIS — R778 Other specified abnormalities of plasma proteins: Secondary | ICD-10-CM | POA: Diagnosis present

## 2018-08-25 DIAGNOSIS — M109 Gout, unspecified: Secondary | ICD-10-CM | POA: Diagnosis present

## 2018-08-25 DIAGNOSIS — Z6841 Body Mass Index (BMI) 40.0 and over, adult: Secondary | ICD-10-CM

## 2018-08-25 DIAGNOSIS — Z820 Family history of epilepsy and other diseases of the nervous system: Secondary | ICD-10-CM

## 2018-08-25 DIAGNOSIS — N189 Chronic kidney disease, unspecified: Secondary | ICD-10-CM

## 2018-08-25 LAB — CBC WITH DIFFERENTIAL/PLATELET
Abs Immature Granulocytes: 0.04 10*3/uL (ref 0.00–0.07)
BASOS PCT: 1 %
Basophils Absolute: 0.1 10*3/uL (ref 0.0–0.1)
Eosinophils Absolute: 0.3 10*3/uL (ref 0.0–0.5)
Eosinophils Relative: 3 %
HCT: 30 % — ABNORMAL LOW (ref 39.0–52.0)
Hemoglobin: 9 g/dL — ABNORMAL LOW (ref 13.0–17.0)
Immature Granulocytes: 0 %
Lymphocytes Relative: 23 %
Lymphs Abs: 2.2 10*3/uL (ref 0.7–4.0)
MCH: 26.2 pg (ref 26.0–34.0)
MCHC: 30 g/dL (ref 30.0–36.0)
MCV: 87.2 fL (ref 80.0–100.0)
MONOS PCT: 9 %
Monocytes Absolute: 0.8 10*3/uL (ref 0.1–1.0)
Neutro Abs: 6.4 10*3/uL (ref 1.7–7.7)
Neutrophils Relative %: 64 %
Platelets: 259 10*3/uL (ref 150–400)
RBC: 3.44 MIL/uL — ABNORMAL LOW (ref 4.22–5.81)
RDW: 15.4 % (ref 11.5–15.5)
WBC: 9.8 10*3/uL (ref 4.0–10.5)
nRBC: 0 % (ref 0.0–0.2)

## 2018-08-25 LAB — TROPONIN I: TROPONIN I: 0.03 ng/mL — AB (ref <0.03)

## 2018-08-25 LAB — COMPREHENSIVE METABOLIC PANEL
ALT: 12 U/L (ref 0–44)
AST: 14 U/L — ABNORMAL LOW (ref 15–41)
Albumin: 3.6 g/dL (ref 3.5–5.0)
Alkaline Phosphatase: 61 U/L (ref 38–126)
Anion gap: 15 (ref 5–15)
BUN: 78 mg/dL — ABNORMAL HIGH (ref 8–23)
CO2: 12 mmol/L — ABNORMAL LOW (ref 22–32)
CREATININE: 6.91 mg/dL — AB (ref 0.61–1.24)
Calcium: 9.7 mg/dL (ref 8.9–10.3)
Chloride: 117 mmol/L — ABNORMAL HIGH (ref 98–111)
GFR calc Af Amer: 9 mL/min — ABNORMAL LOW (ref >60)
GFR calc non Af Amer: 7 mL/min — ABNORMAL LOW (ref >60)
Glucose, Bld: 85 mg/dL (ref 70–99)
Potassium: 5 mmol/L (ref 3.5–5.1)
Sodium: 144 mmol/L (ref 135–145)
Total Bilirubin: 0.7 mg/dL (ref 0.3–1.2)
Total Protein: 6.6 g/dL (ref 6.5–8.1)

## 2018-08-25 LAB — BRAIN NATRIURETIC PEPTIDE: B Natriuretic Peptide: 513 pg/mL — ABNORMAL HIGH (ref 0.0–100.0)

## 2018-08-25 MED ORDER — ACETAMINOPHEN 500 MG PO TABS: 500.0000 mg | ORAL_TABLET | Freq: Every evening | ORAL | Status: DC | PRN

## 2018-08-25 MED ORDER — ALBUTEROL SULFATE (2.5 MG/3ML) 0.083% IN NEBU: 2.5000 mg | INHALATION_SOLUTION | RESPIRATORY_TRACT | Status: DC | PRN

## 2018-08-25 MED ORDER — ONDANSETRON HCL 4 MG/2ML IJ SOLN: 4.0000 mg | Freq: Four times a day (QID) | INTRAMUSCULAR | Status: DC | PRN

## 2018-08-25 MED ORDER — SENNOSIDES-DOCUSATE SODIUM 8.6-50 MG PO TABS: 1.0000 | ORAL_TABLET | Freq: Every evening | ORAL | Status: DC | PRN

## 2018-08-25 MED ORDER — FUROSEMIDE 10 MG/ML IJ SOLN
60.0000 mg | Freq: Once | INTRAMUSCULAR | Status: AC
  Administered 2018-08-25: 60 mg via INTRAVENOUS
  Filled 2018-08-25: qty 6

## 2018-08-25 MED ORDER — AMLODIPINE BESYLATE 5 MG PO TABS
10.0000 mg | ORAL_TABLET | Freq: Every day | ORAL | Status: DC
  Administered 2018-08-26 – 2018-08-28 (×3): 10 mg via ORAL
  Filled 2018-08-25 (×3): qty 2

## 2018-08-25 MED ORDER — CALCIUM CITRATE-VITAMIN D 250-100 MG-UNIT PO TABS: 1.0000 | ORAL_TABLET | Freq: Two times a day (BID) | ORAL | Status: DC

## 2018-08-25 MED ORDER — NITROGLYCERIN 0.4 MG SL SUBL: 0.4000 mg | SUBLINGUAL_TABLET | SUBLINGUAL | Status: DC | PRN

## 2018-08-25 MED ORDER — ACETAMINOPHEN 650 MG RE SUPP: 650.0000 mg | Freq: Four times a day (QID) | RECTAL | Status: DC | PRN

## 2018-08-25 MED ORDER — ASPIRIN EC 81 MG PO TBEC
81.0000 mg | DELAYED_RELEASE_TABLET | Freq: Every day | ORAL | Status: DC
  Administered 2018-08-26 – 2018-08-28 (×3): 81 mg via ORAL
  Filled 2018-08-25 (×3): qty 1

## 2018-08-25 MED ORDER — HYDRALAZINE HCL 20 MG/ML IJ SOLN: 5.0000 mg | INTRAMUSCULAR | Status: DC | PRN

## 2018-08-25 MED ORDER — SODIUM BICARBONATE 650 MG PO TABS: 650.0000 mg | ORAL_TABLET | Freq: Two times a day (BID) | ORAL | Status: DC

## 2018-08-25 MED ORDER — ADULT MULTIVITAMIN W/MINERALS CH
1.0000 | ORAL_TABLET | Freq: Every day | ORAL | Status: DC
  Administered 2018-08-26 – 2018-08-28 (×3): 1 via ORAL
  Filled 2018-08-25 (×3): qty 1

## 2018-08-25 MED ORDER — CARVEDILOL 12.5 MG PO TABS
25.0000 mg | ORAL_TABLET | Freq: Every day | ORAL | Status: DC
  Administered 2018-08-26 – 2018-08-27 (×2): 25 mg via ORAL
  Filled 2018-08-25 (×3): qty 2

## 2018-08-25 MED ORDER — DM-GUAIFENESIN ER 30-600 MG PO TB12: 1.0000 | ORAL_TABLET | Freq: Two times a day (BID) | ORAL | Status: DC | PRN

## 2018-08-25 MED ORDER — SODIUM BICARBONATE 650 MG PO TABS
1300.0000 mg | ORAL_TABLET | Freq: Two times a day (BID) | ORAL | Status: DC
  Administered 2018-08-25 – 2018-08-28 (×6): 1300 mg via ORAL
  Filled 2018-08-25 (×6): qty 2

## 2018-08-25 MED ORDER — DIPHENHYDRAMINE HCL 25 MG PO CAPS
25.0000 mg | ORAL_CAPSULE | Freq: Every evening | ORAL | Status: DC | PRN
  Administered 2018-08-26: 25 mg via ORAL
  Filled 2018-08-25: qty 1

## 2018-08-25 MED ORDER — ROSUVASTATIN CALCIUM 5 MG PO TABS
5.0000 mg | ORAL_TABLET | Freq: Every day | ORAL | Status: DC
  Administered 2018-08-26 – 2018-08-27 (×2): 5 mg via ORAL
  Filled 2018-08-25 (×2): qty 1

## 2018-08-25 MED ORDER — ZOLPIDEM TARTRATE 5 MG PO TABS: 5.0000 mg | ORAL_TABLET | Freq: Every evening | ORAL | Status: DC | PRN

## 2018-08-25 MED ORDER — TRAMADOL HCL 50 MG PO TABS: 50.0000 mg | ORAL_TABLET | Freq: Two times a day (BID) | ORAL | Status: DC | PRN

## 2018-08-25 MED ORDER — FUROSEMIDE 10 MG/ML IJ SOLN
60.0000 mg | Freq: Two times a day (BID) | INTRAMUSCULAR | Status: DC
  Administered 2018-08-26 – 2018-08-27 (×3): 60 mg via INTRAVENOUS
  Filled 2018-08-25 (×3): qty 6

## 2018-08-25 MED ORDER — ONDANSETRON HCL 4 MG PO TABS: 4.0000 mg | ORAL_TABLET | Freq: Four times a day (QID) | ORAL | Status: DC | PRN

## 2018-08-25 MED ORDER — ACETAMINOPHEN 325 MG PO TABS
650.0000 mg | ORAL_TABLET | Freq: Four times a day (QID) | ORAL | Status: DC | PRN
  Administered 2018-08-26: 650 mg via ORAL
  Filled 2018-08-25: qty 2

## 2018-08-25 MED ORDER — TRAMADOL HCL 50 MG PO TABS: 50.0000 mg | ORAL_TABLET | Freq: Two times a day (BID) | ORAL | 0 refills | Status: DC | PRN

## 2018-08-25 MED ORDER — ONE-A-DAY MENS 50+ ADVANTAGE PO TABS: ORAL_TABLET | Freq: Every day | ORAL | Status: DC

## 2018-08-25 MED ORDER — DIPHENHYDRAMINE-APAP (SLEEP) 25-500 MG PO TABS: 1.0000 | ORAL_TABLET | Freq: Every evening | ORAL | Status: DC | PRN

## 2018-08-25 MED ORDER — CALCITRIOL 0.25 MCG PO CAPS
0.2500 ug | ORAL_CAPSULE | Freq: Every day | ORAL | Status: DC
  Administered 2018-08-26 – 2018-08-28 (×3): 0.25 ug via ORAL
  Filled 2018-08-25 (×3): qty 1

## 2018-08-25 MED ORDER — ALBUTEROL SULFATE (2.5 MG/3ML) 0.083% IN NEBU
5.0000 mg | INHALATION_SOLUTION | Freq: Once | RESPIRATORY_TRACT | Status: DC
  Filled 2018-08-25: qty 6

## 2018-08-25 MED ORDER — HEPARIN SODIUM (PORCINE) 5000 UNIT/ML IJ SOLN
5000.0000 [IU] | Freq: Three times a day (TID) | INTRAMUSCULAR | Status: DC
  Administered 2018-08-25 – 2018-08-27 (×7): 5000 [IU] via SUBCUTANEOUS
  Filled 2018-08-25 (×7): qty 1

## 2018-08-25 MED ORDER — LORATADINE 10 MG PO TABS
10.0000 mg | ORAL_TABLET | Freq: Every day | ORAL | Status: DC
  Administered 2018-08-26: 10 mg via ORAL
  Filled 2018-08-25 (×3): qty 1

## 2018-08-25 MED ORDER — CALCIUM CARBONATE-VITAMIN D 500-200 MG-UNIT PO TABS
1.0000 | ORAL_TABLET | Freq: Two times a day (BID) | ORAL | Status: DC
  Administered 2018-08-25 – 2018-08-28 (×6): 1 via ORAL
  Filled 2018-08-25 (×6): qty 1

## 2018-08-25 MED ORDER — FEBUXOSTAT 40 MG PO TABS
40.0000 mg | ORAL_TABLET | ORAL | Status: DC
  Administered 2018-08-27: 40 mg via ORAL
  Filled 2018-08-25: qty 1

## 2018-08-25 NOTE — ED Provider Notes (Signed)
Emergency Department Provider Note   I have reviewed the triage vital signs and the nursing notes.   HISTORY  Chief Complaint Shortness of Breath and Cough   HPI Alex Williams is a 68 y.o. male with PMH of HLD, HTN, and CKD presents to the ED with increasing dyspnea over the past several days.  The patient is not having chest pain.  He went to his primary care physician today who performed a chest x-ray which showed small, bilateral pleural effusions and edema.  An EKG was performed which showed some changes compared to his prior.  He was transferred to the emergency department by EMS.  He denies any fevers, chills.  He has had some mild cough but no flulike symptoms.  Patient takes Lasix 40 mg daily.  He states he supposed to take it twice per day but does not because of his job and states it is inconvenient.  He does take it twice on Saturday but not on Sunday.  He does not have a history of COPD or asthma.  Patient does not smoke cigarettes.  Past Medical History:  Diagnosis Date  . Cervical disc disease   . Chronic kidney disease   . COLONIC POLYPS, HX OF 06/27/2007  . EXOGENOUS OBESITY 01/30/2010  . GOUT 01/30/2010  . HYPERCHOLESTEROLEMIA 06/30/2007  . HYPERTENSION 06/27/2007  . LOW BACK PAIN 06/27/2007  . NEPHROLITHIASIS, HX OF 06/27/2007  . OSTEOARTHRITIS 06/27/2007  . SLEEP APNEA, OBSTRUCTIVE, MODERATE 01/27/2009    Patient Active Problem List   Diagnosis Date Noted  . Nonspecific abnormal electrocardiogram (ECG) (EKG) 08/25/2018  . Anemia in CKD (chronic kidney disease) 08/25/2018  . Acute renal failure superimposed on stage 5 chronic kidney disease, not on chronic dialysis ([place]) 02/13/2018  . Morbid obesity with BMI of 45.0-49.9, adult ([place]) 12/03/2013  . Renal mass 12/02/2013  . Lower extremity edema 03/16/2013  . CKD (chronic kidney disease), stage III ([place]) 12/10/2011  . GOUT 01/30/2010  . Obstructive sleep apnea 01/27/2009  . HYPERCHOLESTEROLEMIA 06/30/2007  .  Essential hypertension 06/27/2007  . Osteoarthritis 06/27/2007  . LOW BACK PAIN 06/27/2007  . History of colonic polyps 06/27/2007  . NEPHROLITHIASIS, HX OF 06/27/2007    Past Surgical History:  Procedure Laterality Date  . CARDIAC CATHETERIZATION    . JOINT REPLACEMENT    . LUMBAR LAMINECTOMY    . RADIOFREQUENCY ABLATION KIDNEY    . TOTAL KNEE ARTHROPLASTY     left   Allergies Codeine phosphate  Family History  Problem Relation Age of Onset  . Hypertension Other   . Sleep apnea Other   . Diabetes Sister   . Hyperlipidemia Sister     Social History Social History   Tobacco Use  . Smoking status: Former Smoker    Last attempt to quit: 07/23/1980    Years since quitting: 38.1  . Smokeless tobacco: Never Used  Substance Use Topics  . Alcohol use: Yes  . Drug use: No    Review of Systems  Constitutional: No fever/chills Eyes: No visual changes. ENT: No sore throat. Cardiovascular: Denies chest pain. Respiratory: Positive shortness of breath. Gastrointestinal: No abdominal pain.  No nausea, no vomiting.  No diarrhea.  No constipation. Genitourinary: Negative for dysuria. Musculoskeletal: Negative for back pain. Skin: Negative for rash. Neurological: Negative for headaches, focal weakness or numbness.  10-point ROS otherwise negative.  ____________________________________________   PHYSICAL EXAM:  VITAL SIGNS: ED Triage Vitals  Enc Vitals Group     BP 08/25/18 1634 Marland Kitchen)  169/75     Pulse Rate 08/25/18 1634 91     Resp --      Temp 08/25/18 1634 98.4 F (36.9 C)     Temp Source 08/25/18 1634 Oral     SpO2 08/25/18 1634 100 %     Pain Score 08/25/18 1641 0   Constitutional: Alert and oriented. Well appearing and in no acute distress. Eyes: Conjunctivae are normal.  Head: Atraumatic. Nose: No congestion/rhinnorhea. Mouth/Throat: Mucous membranes are moist.  Neck: No stridor.   Cardiovascular: Normal rate, regular rhythm. Good peripheral circulation.  Grossly normal heart sounds.   Respiratory: Slight increased respiratory effort.  No retractions. Lungs with crackles at the bases bilaterally.  Gastrointestinal: Soft and nontender. No distention.  Musculoskeletal: No lower extremity tenderness with 2+ pitting edema bilaterally. No gross deformities of extremities. Neurologic:  Normal speech and language. No gross focal neurologic deficits are appreciated.  Skin:  Skin is warm, dry and intact. No rash noted.  ____________________________________________   LABS (all labs ordered are listed, but only abnormal results are displayed)  Labs Reviewed  COMPREHENSIVE METABOLIC PANEL - Abnormal; Notable for the following components:      Result Value   Chloride 117 (*)    CO2 12 (*)    BUN 78 (*)    Creatinine, Ser 6.91 (*)    AST 14 (*)    GFR calc non Af Amer 7 (*)    GFR calc Af Amer 9 (*)    All other components within normal limits  BRAIN NATRIURETIC PEPTIDE - Abnormal; Notable for the following components:   B Natriuretic Peptide 513.0 (*)    All other components within normal limits  TROPONIN I - Abnormal; Notable for the following components:   Troponin I 0.03 (*)    All other components within normal limits  CBC WITH DIFFERENTIAL/PLATELET - Abnormal; Notable for the following components:   RBC 3.44 (*)    Hemoglobin 9.0 (*)    HCT 30.0 (*)    All other components within normal limits   ____________________________________________  EKG   EKG Interpretation  Date/Time:  Monday August 25 2018 17:16:27 EST Ventricular Rate:  84 PR Interval:    QRS Duration: 99 QT Interval:  380 QTC Calculation: 450 R Axis:   132 Text Interpretation:  Right and left arm electrode reversal, interpretation assumes no reversal Sinus or ectopic atrial rhythm Anterolateral infarct, old No STEMI. Similar to prior  Confirmed by Nanda Quinton [phone]) on 08/25/2018 5:26:29 PM       ____________________________________________  RADIOLOGY  Dg  Chest 2 View  Result Date: 08/25/2018 CLINICAL DATA:  Shortness of breath. EXAM: CHEST - 2 VIEW COMPARISON:  CT chest dated January 28, 2010. Chest x-ray dated January 24, 2010. FINDINGS: Borderline cardiomegaly. Mild pulmonary vascular congestion. Small bilateral pleural effusions with bibasilar atelectasis. No pneumothorax. No acute osseous abnormality. IMPRESSION: 1. Pulmonary vascular congestion and small bilateral pleural effusions. 2. Bibasilar atelectasis. Electronically Signed   By: Titus Dubin M.D.   On: 08/25/2018 14:22    ____________________________________________   PROCEDURES  Procedure(s) performed:   Procedures  None ____________________________________________   INITIAL IMPRESSION / ASSESSMENT AND PLAN / ED COURSE  Pertinent labs & imaging results that were available during my care of the patient were reviewed by me and considered in my medical decision making (see chart for details).  Patient presents to the emergency department with shortness of breath primarily.  No chest pain.  EKG performed here in the emergency  department is similar to prior tracings.  Very low suspicion for acute coronary syndrome although symptoms could be atypical.  Extremely low suspicion for PE.  Patient has pitting edema in the bilateral lower extremities and crackles at the bases bilaterally.  No hypoxemia but does have increased work of breathing.  He is compliant with Lasix at home but only takes half the dose prescribed.  Plan for Lasix here along with labs.  Chest x-ray available for review from patient's PCP which was performed today.   08:00 PM Patient with acute on chronic renal failure.  Mildly elevated troponin not likely primary ACS.  Chest x-ray reviewed from PCPs office.  Patient with normal urine output increased since giving Lasix here.  I spoke with Dr. Hollie Salk with Nephrology who will consult as inpatient. No hypoxemia and only mild/moderate symptoms at this time so I doubt emergent HD  is required.   Discussed patient's case with Hospitalist, Dr. Blaine Hamper to request admission. Patient and family (if present) updated with plan. Care transferred to Hospitalist service.  I reviewed all nursing notes, vitals, pertinent old records, EKGs, labs, imaging (as available).  ____________________________________________  FINAL CLINICAL IMPRESSION(S) / ED DIAGNOSES  Final diagnoses:  Acute pulmonary edema ([place])  Acute renal failure superimposed on chronic kidney disease, unspecified CKD stage, unspecified acute renal failure type ([place])    MEDICATIONS GIVEN DURING THIS VISIT:  Medications  furosemide (LASIX) injection 60 mg (60 mg Intravenous Given 08/25/18 1947)    Note:  This document was prepared using Dragon voice recognition software and may include unintentional dictation errors.  Nanda Quinton, MD Emergency Medicine    Hosteen Kienast, Wonda Old[facility], MD 08/25/18 Thom Chimes

## 2018-08-25 NOTE — Patient Instructions (Signed)
° ° ° °  If you have lab work done today you will be contacted with your lab results within the next 2 weeks.  If you have not heard from us then please contact us. The fastest way to get your results is to register for My Chart. ° ° °IF you received an x-ray today, you will receive an invoice from [facility]. Please contact [facility] at [phone] with questions or concerns regarding your invoice.  ° °IF you received labwork today, you will receive an invoice from [facility]. Please contact [facility] at [phone] with questions or concerns regarding your invoice.  ° °Our billing staff will not be able to assist you with questions regarding bills from these companies. ° °You will be contacted with the lab results as soon as they are available. The fastest way to get your results is to activate your My Chart account. Instructions are located on the last page of this paperwork. If you have not heard from us regarding the results in 2 weeks, please contact this office. °  ° ° ° °

## 2018-08-25 NOTE — H&P (Signed)
History and Physical    Jewel Venditto Pearcy OVZ:[id number] DOB: 1950/07/25 DOA: 08/25/2018  Referring MD/NP/PA:   PCP: [name], MD   Patient coming from:  The patient is coming from home.  At baseline, pt is independent for most of ADL.        Chief Complaint: SOB  HPI: Jakori Burkett Mccurdy is a 68 y.o. male with medical history significant of CKD-V, hypertension, hyperlipidemia, obesity, gout, OSA on CPAP, back pain, who presents with shortness of breath.  Patient states that he has been having shortness of breath for about 3 weeks, which has worsened in the past several days.  He does not have chest pain, fever or chills.  He has cough with clear mucus production currently.  No runny nose or sore throat.  Patient does not have nausea vomiting, diarrhea, abdominal pain. He has bilateral lower leg edema. Denies any symptoms for UTI.  No unilateral weakness.  Patient was seen by PCP at [facility] clinic today, and found to have abnormal EKG and bilateral pulmonary effusion, and therefore was sent to ED for further evaluation treatment.   ED Course: pt was found to have worsening renal function, WBC 9.8, temperature normal, heart rate 80s, oxygen saturation 97% on room air.  Chest x-ray showed vascular congestion and small bilateral pleural effusion.  Troponin 0.03.  Patient is placed on telemetry bed for observation.  Review of Systems:   General: no fevers, chills, no fatigue HEENT: no blurry vision, hearing changes or sore throat Respiratory: has dyspnea, coughing, no wheezing CV: no chest pain, no palpitations GI: no nausea, vomiting, abdominal pain, diarrhea, constipation GU: no dysuria, burning on urination, increased urinary frequency, hematuria  Ext: has leg edema Neuro: no unilateral weakness, numbness, or tingling, no vision change or hearing loss Skin: no rash, no skin tear. MSK: No muscle spasm, no deformity, no limitation of range of movement in spin Heme: No easy bruising.  Travel  history: No recent long distant travel.  Allergy:  Allergies  Allergen Reactions  . Codeine Phosphate     Past Medical History:  Diagnosis Date  . Cervical disc disease   . Chronic kidney disease   . COLONIC POLYPS, HX OF 06/27/2007  . EXOGENOUS OBESITY 01/30/2010  . GOUT 01/30/2010  . HYPERCHOLESTEROLEMIA 06/30/2007  . HYPERTENSION 06/27/2007  . LOW BACK PAIN 06/27/2007  . NEPHROLITHIASIS, HX OF 06/27/2007  . OSTEOARTHRITIS 06/27/2007  . SLEEP APNEA, OBSTRUCTIVE, MODERATE 01/27/2009    Past Surgical History:  Procedure Laterality Date  . CARDIAC CATHETERIZATION    . JOINT REPLACEMENT    . LUMBAR LAMINECTOMY    . RADIOFREQUENCY ABLATION KIDNEY    . TOTAL KNEE ARTHROPLASTY     left    Social History:  reports that he quit smoking about 38 years ago. He has never used smokeless tobacco. He reports current alcohol use. He reports that he does not use drugs.  Family History:  Family History  Problem Relation Age of Onset  . Hypertension Other   . Sleep apnea Other   . Diabetes Sister   . Hyperlipidemia Sister      Prior to Admission medications   Medication Sig Start Date End Date Taking? Authorizing Provider  amLODipine (NORVASC) 10 MG tablet Take 1 tablet (10 mg total) by mouth daily. 02/11/18   Weber, Damaris Hippo, PA-C  aspirin 81 MG tablet Take 81 mg by mouth daily.      [provider]  calcium-vitamin D 250-100 MG-UNIT tablet Take  1 tablet by mouth 2 (two) times daily.    [provider]  carvedilol (COREG) 25 MG tablet Take 25 mg by mouth daily. 11/22/17   [provider]  cetirizine (ZYRTEC) 10 MG tablet Take 10 mg by mouth daily. Reported on 08/07/2015    [provider]  furosemide (LASIX) 40 MG tablet Take 1 tablet (40 mg total) by mouth 2 (two) times daily. Patient taking differently: Take 40 mg by mouth daily.  08/09/17   Weber, Damaris Hippo, PA-C  Multiple Vitamins-Minerals (ONE-A-DAY MENS 50+ ADVANTAGE PO) Take 1 tablet by mouth daily.     [provider]  rosuvastatin (CRESTOR) 5 MG tablet Take 1 tablet (5 mg total) by mouth daily. 08/09/17   Weber, Damaris Hippo, PA-C  sodium bicarbonate 650 MG tablet Take 1 tablet (650 mg total) by mouth 2 (two) times daily. 02/11/18   Weber, Damaris Hippo, PA-C  traMADol (ULTRAM) 50 MG tablet Take 1 tablet (50 mg total) by mouth every 12 (twelve) hours as needed. 08/25/18   [name], MD  ULORIC 40 MG tablet Take 40 mg by mouth every other day.  11/06/16   [provider]    Physical Exam: Vitals:   08/25/18 1634 08/25/18 1730  BP: (!) 169/75 (!) 156/85  Pulse: 91 84  Resp:  20  Temp: 98.4 F (36.9 C)   TempSrc: Oral   SpO2: 100% 100%   General: Not in acute distress HEENT:       Eyes: PERRL, EOMI, no scleral icterus.       ENT: No discharge from the ears and nose, no pharynx injection, no tonsillar enlargement.        Neck: Difficult to assess JVD due to obesity. No bruit, no mass felt. Heme: No neck lymph node enlargement. Cardiac: S1/S2, RRR, No murmurs, No gallops or rubs. Respiratory: has mild fine crackles at bases bilaterally GI: Soft, nondistended, nontender, no rebound pain, no organomegaly, BS present. GU: No hematuria Ext: 2+ pitting leg edema bilaterally. 2+DP/PT pulse bilaterally. Musculoskeletal: No joint deformities, No joint redness or warmth, no limitation of ROM in spin. Skin: No rashes.  Neuro: Alert, oriented X3, cranial nerves II-XII grossly intact, moves all extremities normally. Psych: Patient is not psychotic, no suicidal or hemocidal ideation.  Labs on Admission: I have personally reviewed following labs and imaging studies  CBC: Recent Labs  Lab 08/25/18 1746  WBC 9.8  NEUTROABS 6.4  HGB 9.0*  HCT 30.0*  MCV 87.2  PLT 425   Basic Metabolic Panel: Recent Labs  Lab 08/25/18 1746  NA 144  K 5.0  CL 117*  CO2 12*  GLUCOSE 85  BUN 78*  CREATININE 6.91*  CALCIUM 9.7   GFR: Estimated Creatinine Clearance: 15.5 mL/min (A) (by  C-G formula based on SCr of 6.91 mg/dL (H)). Liver Function Tests: Recent Labs  Lab 08/25/18 1746  AST 14*  ALT 12  ALKPHOS 61  BILITOT 0.7  PROT 6.6  ALBUMIN 3.6   No results for input(s): LIPASE, AMYLASE in the last 168 hours. No results for input(s): AMMONIA in the last 168 hours. Coagulation Profile: No results for input(s): INR, PROTIME in the last 168 hours. Cardiac Enzymes: Recent Labs  Lab 08/25/18 1746  TROPONINI 0.03*   BNP (last 3 results) No results for input(s): PROBNP in the last 8760 hours. HbA1C: No results for input(s): HGBA1C in the last 72 hours. CBG: No results for input(s): GLUCAP in the last 168 hours. Lipid Profile:  No results for input(s): CHOL, HDL, LDLCALC, TRIG, CHOLHDL, LDLDIRECT in the last 72 hours. Thyroid Function Tests: No results for input(s): TSH, T4TOTAL, FREET4, T3FREE, THYROIDAB in the last 72 hours. Anemia Panel: No results for input(s): VITAMINB12, FOLATE, FERRITIN, TIBC, IRON, RETICCTPCT in the last 72 hours. Urine analysis:    Component Value Date/Time   COLORURINE DK. YELLOW 01/20/2009 0918   APPEARANCEUR Clear 08/09/2017 1116   LABSPEC >=1.030 01/20/2009 0918   PHURINE 5.0 01/20/2009 0918   GLUCOSEU Negative 08/09/2017 1116   GLUCOSEU NEGATIVE 01/20/2009 0918   BILIRUBINUR Negative 08/09/2017 1116   KETONESUR negative 12/01/2016 0822   KETONESUR TRACE 01/20/2009 0918   PROTEINUR 3+ (A) 08/09/2017 1116   UROBILINOGEN 0.2 12/01/2016 0822   UROBILINOGEN 0.2 01/20/2009 0918   NITRITE Negative 08/09/2017 1116   NITRITE NEGATIVE 01/20/2009 0918   LEUKOCYTESUR Negative 08/09/2017 1116   Sepsis Labs: @LABRCNTIP (procalcitonin:4,lacticidven:4) )No results found for this or any previous visit (from the past 240 hour(s)).   Radiological Exams on Admission: Dg Chest 2 View  Result Date: 08/25/2018 CLINICAL DATA:  Shortness of breath. EXAM: CHEST - 2 VIEW COMPARISON:  CT chest dated January 28, 2010. Chest x-ray dated January 24, 2010.  FINDINGS: Borderline cardiomegaly. Mild pulmonary vascular congestion. Small bilateral pleural effusions with bibasilar atelectasis. No pneumothorax. No acute osseous abnormality. IMPRESSION: 1. Pulmonary vascular congestion and small bilateral pleural effusions. 2. Bibasilar atelectasis. Electronically Signed   By: Titus Dubin M.D.   On: 08/25/2018 14:22     EKG: Independently reviewed.  Sinus rhythm, QTC 450, LAD, low voltage, anteroseptal infarction pattern, nonspecific T wave change.   Assessment/Plan Principal Problem:   Acute renal failure superimposed on stage 5 chronic kidney disease, not on chronic dialysis (HCC) Active Problems:   HYPERCHOLESTEROLEMIA   GOUT   Obstructive sleep apnea   Essential hypertension   Anemia in CKD (chronic kidney disease)   Elevated troponin   Acute pulmonary edema (HCC)  Acute pulmonary edema due to acute renal failure superimposed on stage 5 chronic kidney disease, not on chronic dialysis [facility]): pt's SOB is likely due to acute pulmonary edema. Pt has bilateral leg edema.  Chest x-ray showed vascular congestion and bilateral small pulmonary edema.  Patient has history of CKD-V, which has worsened. Baseline Cre is 3.0-4.0 last year in May-Aug 2019, then 5.30 on 02/11/18. pt's Cre is 6.91 and BUN 78 on admission. This is likely due to gradual progression of CKD-V. Pt is taking Lasix 40 daily. Will try IV diuretics.  Renal, Dr. Hollie Salk was consulted by EDP.   -will place on tele bed for obs -lasix 60 mg bid by IV -prn albuterol nebs for SOB and mucinex for cough -Follow up renal function by BMP -avoid renal toxic meds -f/u renal's recommendations  HYPERCHOLESTEROLEMIA: -Crestor  GOUT: -Uloric  Obstructive sleep apnea: -CPAP  HTN:  -Continue home medications: Amlodipine, Coreg -Patient is also on IV Lasix -IV hydralazine prn  Anemia in CKD (chronic kidney disease): Hemoglobin slightly dropped from 10.8 on 08/09/17-9.0.  No active  bleeding. -Check anemia panel  Elevated troponin: Trop 0.03. No CP. This is likely due to AoCKD-V -cycle CE q6 x3 and repeat EKG in the am  - prn Nitroglycerin,  -continue aspirin, crestor - Risk factor stratification: will check FLP and A1C      DVT ppx: SQ Heparin     Code Status: Full code Family Communication: None at bed side.  Disposition Plan:  Anticipate discharge back to previous home environment Consults  called:  Renal, Dr. Hollie Salk Admission status: Obs / tele     Date of Service 08/25/2018    [facility]   If 7PM-7AM, please contact night-coverage [email or web address] Password [email or web address] 08/25/2018, 8:50 PM

## 2018-08-25 NOTE — Consult Note (Signed)
[facility]  HISTORY AND PHYSICAL  Alex Williams is an 68 y.o. male.    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 who is now seen in consultation at the request of Dr. Laverta Baltimore for evaluation and recommendations surrounding AKI on CKD and volume overload.  Was last seen Dec 10th and was warned about the dangers of "crash-start" dialysis.  He has refused access placement and dialysis education classes for now.    He comes in today with 3 week history of cough and some SOB. CXR demonstrated mild pulm edema.  He is not requiring O2. Cr 6.9.  In this setting we are asked to see.    Denies f/c, n/v, CP.  Some mild LE edema.  No decreased appetite, hiccups, dysgeusia.  PMH: Past Medical History:  Diagnosis Date  . Cervical disc disease   . Chronic kidney disease   . COLONIC POLYPS, HX OF 06/27/2007  . EXOGENOUS OBESITY 01/30/2010  . GOUT 01/30/2010  . HYPERCHOLESTEROLEMIA 06/30/2007  . HYPERTENSION 06/27/2007  . LOW BACK PAIN 06/27/2007  . NEPHROLITHIASIS, HX OF 06/27/2007  . OSTEOARTHRITIS 06/27/2007  . SLEEP APNEA, OBSTRUCTIVE, MODERATE 01/27/2009   PSH: Past Surgical History:  Procedure Laterality Date  . CARDIAC CATHETERIZATION    . JOINT REPLACEMENT    . LUMBAR LAMINECTOMY    . RADIOFREQUENCY ABLATION KIDNEY    . TOTAL KNEE ARTHROPLASTY     left     Past Medical History:  Diagnosis Date  . Cervical disc disease   . Chronic kidney disease   . COLONIC POLYPS, HX OF 06/27/2007  . EXOGENOUS OBESITY 01/30/2010  . GOUT 01/30/2010  . HYPERCHOLESTEROLEMIA 06/30/2007  . HYPERTENSION 06/27/2007  . LOW BACK PAIN 06/27/2007  . NEPHROLITHIASIS, HX OF 06/27/2007  . OSTEOARTHRITIS 06/27/2007  . SLEEP APNEA, OBSTRUCTIVE, MODERATE 01/27/2009    Medications:   Scheduled: . [START ON 08/26/2018] amLODipine  10 mg Oral Daily  . aspirin  81 mg Oral Daily   . calcium-vitamin D  1 tablet Oral BID  . carvedilol  25 mg Oral Daily  . [START ON 08/27/2018] febuxostat  40 mg Oral QODAY  . [START ON 08/26/2018] furosemide  60 mg Intravenous Q12H  . heparin  5,000 Units Subcutaneous Q8H  . loratadine  10 mg Oral Daily  . ONE-A-DAY MENS 50+ ADVANTAGE   Oral Daily  . rosuvastatin  5 mg Oral Daily  . sodium bicarbonate  1,300 mg Oral BID    OP meds include: Uloric 40 mg daily Colchicine 0.6 mg prn flare Lasix 40 mg daily (supposed to be taken BID) Calcitriol 0.25 mcg daily Sodium bicarbonate 650 mg BID Carvedilol 25 mg BID Amlodipine 10 mg daily  ALLERGIES:   Allergies  Allergen Reactions  . Codeine Phosphate     FAM HX: Family History  Problem Relation Age of Onset  . Hypertension Other   . Sleep apnea Other   . Diabetes Sister   . Hyperlipidemia Sister     Social History:   reports that he quit smoking about 38 years ago. He has never used smokeless tobacco. He reports current alcohol use. He reports that he does not use drugs.  ROS: ROS: all other systems reviewed and are negative except as per HPI  Blood pressure (!) 156/85, pulse 84, temperature 98.4 F (36.9 C), temperature source  Oral, resp. rate 20, SpO2 100 %. PHYSICAL EXAM: Physical Exam  GEN NAD, sitting in bed HEENT EOMI PERRL NECK + JVD to angle of mandible PULM slightly increased WOB, not requiring O2, no frank crackles bilaterally CV tachycardic, no m/r/g ABD soft nontender NABS EXT 1+ bilateral LE edema NEURO AAO x 3 nonfocal no asterixis   Results for orders placed or performed during the hospital encounter of 08/25/18 (from the past 48 hour(s))  Comprehensive metabolic panel     Status: Abnormal   Collection Time: 08/25/18  5:46 PM  Result Value Ref Range   Sodium 144 135 - 145 mmol/L   Potassium 5.0 3.5 - 5.1 mmol/L   Chloride 117 (H) 98 - 111 mmol/L   CO2 12 (L) 22 - 32 mmol/L   Glucose, Bld 85 70 - 99 mg/dL   BUN 78 (H) 8 - 23 mg/dL   Creatinine,  Ser 6.91 (H) 0.61 - 1.24 mg/dL   Calcium 9.7 8.9 - 10.3 mg/dL   Total Protein 6.6 6.5 - 8.1 g/dL   Albumin 3.6 3.5 - 5.0 g/dL   AST 14 (L) 15 - 41 U/L   ALT 12 0 - 44 U/L   Alkaline Phosphatase 61 38 - 126 U/L   Total Bilirubin 0.7 0.3 - 1.2 mg/dL   GFR calc non Af Amer 7 (L) >60 mL/min   GFR calc Af Amer 9 (L) >60 mL/min   Anion gap 15 5 - 15    Comment: Performed at [facility], [address] [place], [place] [address]  Brain natriuretic peptide     Status: Abnormal   Collection Time: 08/25/18  5:46 PM  Result Value Ref Range   B Natriuretic Peptide 513.0 (H) 0.0 - 100.0 pg/mL    Comment: Performed at [facility] [address] [place], [place] [address]  Troponin I - Once     Status: Abnormal   Collection Time: 08/25/18  5:46 PM  Result Value Ref Range   Troponin I 0.03 (HH) <0.03 ng/mL    Comment: CRITICAL RESULT CALLED TO, READ BACK BY AND VERIFIED WITH: [place] 08/25/2018 WBOND Performed at [facility] [address] [place], [place] [address]   CBC with Differential     Status: Abnormal   Collection Time: 08/25/18  5:46 PM  Result Value Ref Range   WBC 9.8 4.0 - 10.5 K/uL   RBC 3.44 (L) 4.22 - 5.81 MIL/uL   Hemoglobin 9.0 (L) 13.0 - 17.0 g/dL   HCT 30.0 (L) 39.0 - 52.0 %   MCV 87.2 80.0 - 100.0 fL   MCH 26.2 26.0 - 34.0 pg   MCHC 30.0 30.0 - 36.0 g/dL   RDW 15.4 11.5 - 15.5 %   Platelets 259 150 - 400 K/uL   nRBC 0.0 0.0 - 0.2 %   Neutrophils Relative % 64 %   Neutro Abs 6.4 1.7 - 7.7 K/uL   Lymphocytes Relative 23 %   Lymphs Abs 2.2 0.7 - 4.0 K/uL   Monocytes Relative 9 %   Monocytes Absolute 0.8 0.1 - 1.0 K/uL   Eosinophils Relative 3 %   Eosinophils Absolute 0.3 0.0 - 0.5 K/uL   Basophils Relative 1 %   Basophils Absolute 0.1 0.0 - 0.1 K/uL   Immature Granulocytes 0 %   Abs Immature Granulocytes 0.04 0.00 - 0.07 K/uL    Comment: Performed at [facility] [address], [place], [place]  [address]    Dg Chest  2 View  Result Date: 08/25/2018 CLINICAL DATA:  Shortness of breath. EXAM: CHEST - 2 VIEW COMPARISON:  CT chest dated January 28, 2010. Chest x-ray dated January 24, 2010. FINDINGS: Borderline cardiomegaly. Mild pulmonary vascular congestion. Small bilateral pleural effusions with bibasilar atelectasis. No pneumothorax. No acute osseous abnormality. IMPRESSION: 1. Pulmonary vascular congestion and small bilateral pleural effusions. 2. Bibasilar atelectasis. Electronically Signed   By: Titus Dubin M.D.   On: 08/25/2018 14:22    Assessment/Plan  1.  Acute pulm edema: mild, hopefully can resolve with IV Lasix.  Very resistant to have access placement or to start dialysis.  Will try IV Lasix to see if we can get some UOP and then go from there.  Got 60 IV already in ED.  2.  AKI on CKD V: as above- hopefully will not have to start dialysis during this admission.  Not uremic.  K OK.  3.  Anemia of CKD: monitor, Hgb not < 9.0.  4.  Bone/ mineral: on calcitriol as OP, will check PTH and phos  5.  Dispo: pending   Madelon Lips 08/25/2018, 8:15 PM

## 2018-08-25 NOTE — ED Notes (Signed)
Pt ambulatory to the restroom.  

## 2018-08-25 NOTE — Progress Notes (Signed)
2/3/20201:53 PM  Alex Williams 1951-02-22, 68 y.o. male [id number]  Chief Complaint  Patient presents with  . Wheezing    took flu shot 06/2018,wheezing 10 days after with productive cough. Taking otc for the symptoms  . Pain    due to spinal surgery in the 90's asking for pill for pain    HPI:   Patient is a 68 y.o. male with past medical history significant for HTN, HLP, gout, CKD stage 5 who presents today for to establish care  Chart review: Previous PCP Gale Journey, PA-C Last OV July 2019 Sees renal, Dr Lyda Kalata, last OV dec 2019 - CKD 5, multifactorial (HTN, stones, ablation of complex cyst), not on HD as patient has not followed thru with access planning Had vasc studies for vein mapping in may 2019 that have not been completed Last crt 6.9, GFR 9 He also declined IV iron  Patient's history: Having wheezing, SOB mostly when he lies down mildly productive cough for past 10 days No fever, but having occ chills No body aches, nausea, vomiting, diarrhea Denies increase swelling of legs Denies any CP or palpitations Stopped smoking in 1981 Takes furosemide 77m once a day Patient is not anuric Patient sees renal every 3 months  Requesting refill of tramadol for flareup of back pain Back surgery, remote Takes very prn pmp reviewed   Fall Risk  08/25/2018 02/11/2018 02/04/2018 01/30/2018 01/27/2018  Falls in the past year? 0 No No No No  Number falls in past yr: 0 - - - -  Injury with Fall? 0 - - - -  Comment - - - - -  Risk for fall due to : Other (Comment) - - - -  Follow up Falls evaluation completed - - - -     [facility] 08/25/2018 02/11/2018 02/04/2018  Decreased Interest 0 0 0  Down, Depressed, Hopeless 0 0 0  PHQ - 2 Score 0 0 0    Allergies  Allergen Reactions  . Codeine Phosphate     Prior to Admission medications   Medication Sig Start Date End Date Taking? Authorizing Provider  amLODipine (NORVASC) 10 MG tablet Take 1 tablet (10 mg total)  by mouth daily. 02/11/18  Yes Weber, SDamaris Hippo PA-C  aspirin 81 MG tablet Take 81 mg by mouth daily.     Yes [provider]  carvedilol (COREG) 25 MG tablet Take 25 mg by mouth daily. 11/22/17  Yes [provider]  cetirizine (ZYRTEC) 10 MG tablet Take 10 mg by mouth daily. Reported on 08/07/2015   Yes [provider]  furosemide (LASIX) 40 MG tablet Take 1 tablet (40 mg total) by mouth 2 (two) times daily. Patient taking differently: Take 40 mg by mouth daily.  08/09/17  Yes Weber, SDamaris Hippo PA-C  Multiple Vitamins-Minerals (ONE-A-DAY MENS 50+ ADVANTAGE PO) Take 1 tablet by mouth daily.   Yes [provider]  rosuvastatin (CRESTOR) 5 MG tablet Take 1 tablet (5 mg total) by mouth daily. 08/09/17  Yes Weber, Sarah L, PA-C  sodium bicarbonate 650 MG tablet Take 1 tablet (650 mg total) by mouth 2 (two) times daily. 02/11/18  Yes Weber, Sarah L, PA-C  ULORIC 40 MG tablet Take 40 mg by mouth every other day.  11/06/16  Yes [provider]    Past Medical History:  Diagnosis Date  . Cervical disc disease   . Chronic kidney disease   . COLONIC POLYPS, HX OF 06/27/2007  . EXOGENOUS OBESITY  01/30/2010  . GOUT 01/30/2010  . HYPERCHOLESTEROLEMIA 06/30/2007  . HYPERTENSION 06/27/2007  . LOW BACK PAIN 06/27/2007  . NEPHROLITHIASIS, HX OF 06/27/2007  . OSTEOARTHRITIS 06/27/2007  . SLEEP APNEA, OBSTRUCTIVE, MODERATE 01/27/2009    Past Surgical History:  Procedure Laterality Date  . CARDIAC CATHETERIZATION    . JOINT REPLACEMENT    . LUMBAR LAMINECTOMY    . RADIOFREQUENCY ABLATION KIDNEY    . TOTAL KNEE ARTHROPLASTY     left    Social History   Tobacco Use  . Smoking status: Former Smoker    Last attempt to quit: 07/23/1980    Years since quitting: 38.1  . Smokeless tobacco: Never Used  Substance Use Topics  . Alcohol use: Yes    Family History  Problem Relation Age of Onset  . Hypertension Other   . Sleep apnea Other   . Diabetes Sister   . Hyperlipidemia  Sister     ROS Per hpi  OBJECTIVE:  Blood pressure (!) 160/94, pulse 94, temperature 97.8 F (36.6 C), temperature source Oral, height 5' 11"  (1.803 m), weight (!) 342 lb (155.1 kg), SpO2 97 %. Body mass index is 47.7 kg/m.   Wt Readings from Last 3 Encounters:  08/25/18 (!) 342 lb (155.1 kg)  02/11/18 (!) 344 lb 6.4 oz (156.2 kg)  02/04/18 (!) 348 lb (157.9 kg)    Physical Exam Vitals signs and nursing note reviewed.  Constitutional:      Appearance: He is well-developed.  HENT:     Head: Normocephalic and atraumatic.  Eyes:     Conjunctiva/sclera: Conjunctivae normal.     Pupils: Pupils are equal, round, and reactive to light.  Neck:     Musculoskeletal: Neck supple.  Cardiovascular:     Rate and Rhythm: Normal rate and regular rhythm.     Heart sounds: No murmur. No friction rub. No gallop.   Pulmonary:     Effort: Pulmonary effort is normal.     Breath sounds: No wheezing, rhonchi or rales.  Musculoskeletal:     Right lower leg: Edema present.     Left lower leg: Edema (+ 2 pittiing edema, bilaterally) present.  Skin:    General: Skin is warm and dry.  Neurological:     Mental Status: He is alert and oriented to person, place, and time.     Lab Results  Component Value Date   CREATININE 5.30 ([place]) 02/11/2018    Dg Chest 2 View  Result Date: 08/25/2018 CLINICAL DATA:  Shortness of breath. EXAM: CHEST - 2 VIEW COMPARISON:  CT chest dated January 28, 2010. Chest x-ray dated January 24, 2010. FINDINGS: Borderline cardiomegaly. Mild pulmonary vascular congestion. Small bilateral pleural effusions with bibasilar atelectasis. No pneumothorax. No acute osseous abnormality. IMPRESSION: 1. Pulmonary vascular congestion and small bilateral pleural effusions. 2. Bibasilar atelectasis. Electronically Signed   By: Titus Dubin M.D.   On: 08/25/2018 14:22   My interpretation of EKG:  Sinus, HR 89, new qwaves v1-v3, normal intervals, compared to jan 2019  ASSESSMENT and  PLAN  1. Hypervolemia, unspecified hypervolemia type 2. Nonspecific abnormal electrocardiogram (ECG) (EKG) Patient with new onset of pulmonary edema in setting of CKD 5. He has changes to ekg, unable to state if recent or remote. However patient needs ER eval to r/o possible cardiac cause to recent change in status. Also eval of electrolytes and chemistries - dose he need HD? Sent via EMS.  3. CKD stage 5 secondary to hypertension ([place])  4. Essential  hypertension - Lipid panel - TSH - CMP14+EGFR - EKG 12-Lead  5. SOB (shortness of breath) - DG Chest 2 View; Future - EKG 12-Lead  Other orders - calcium-vitamin D 250-100 MG-UNIT tablet; Take 1 tablet by mouth 2 (two) times daily. - traMADol (ULTRAM) 50 MG tablet; Take 1 tablet (50 mg total) by mouth every 12 (twelve) hours as needed.    Return for after hosp.    [name], MD Primary Care at [facility] [place], [place] [address] Ph.  [phone] Fax [phone]

## 2018-08-25 NOTE — ED Triage Notes (Addendum)
Pt transported by [facility] d/t + Pulmonary effusion and abnormal changes on his EKG from [place].  He reports sob with wheezing x 3 weeks.  No cp.  He is A&Ox 4, in NAD.  He appears comfortable.

## 2018-08-26 DIAGNOSIS — I120 Hypertensive chronic kidney disease with stage 5 chronic kidney disease or end stage renal disease: Secondary | ICD-10-CM | POA: Diagnosis present

## 2018-08-26 DIAGNOSIS — G4733 Obstructive sleep apnea (adult) (pediatric): Secondary | ICD-10-CM | POA: Diagnosis present

## 2018-08-26 DIAGNOSIS — J810 Acute pulmonary edema: Secondary | ICD-10-CM | POA: Diagnosis present

## 2018-08-26 DIAGNOSIS — Z87891 Personal history of nicotine dependence: Secondary | ICD-10-CM | POA: Diagnosis not present

## 2018-08-26 DIAGNOSIS — J90 Pleural effusion, not elsewhere classified: Secondary | ICD-10-CM | POA: Diagnosis present

## 2018-08-26 DIAGNOSIS — E872 Acidosis: Secondary | ICD-10-CM | POA: Diagnosis present

## 2018-08-26 DIAGNOSIS — Z8249 Family history of ischemic heart disease and other diseases of the circulatory system: Secondary | ICD-10-CM | POA: Diagnosis not present

## 2018-08-26 DIAGNOSIS — R7989 Other specified abnormal findings of blood chemistry: Secondary | ICD-10-CM | POA: Diagnosis present

## 2018-08-26 DIAGNOSIS — Z9989 Dependence on other enabling machines and devices: Secondary | ICD-10-CM | POA: Diagnosis not present

## 2018-08-26 DIAGNOSIS — D631 Anemia in chronic kidney disease: Secondary | ICD-10-CM | POA: Diagnosis present

## 2018-08-26 DIAGNOSIS — Z87442 Personal history of urinary calculi: Secondary | ICD-10-CM | POA: Diagnosis not present

## 2018-08-26 DIAGNOSIS — E785 Hyperlipidemia, unspecified: Secondary | ICD-10-CM | POA: Diagnosis present

## 2018-08-26 DIAGNOSIS — M109 Gout, unspecified: Secondary | ICD-10-CM | POA: Diagnosis present

## 2018-08-26 DIAGNOSIS — Z96652 Presence of left artificial knee joint: Secondary | ICD-10-CM | POA: Diagnosis present

## 2018-08-26 DIAGNOSIS — N179 Acute kidney failure, unspecified: Secondary | ICD-10-CM | POA: Diagnosis present

## 2018-08-26 DIAGNOSIS — Z8349 Family history of other endocrine, nutritional and metabolic diseases: Secondary | ICD-10-CM | POA: Diagnosis not present

## 2018-08-26 DIAGNOSIS — R0602 Shortness of breath: Secondary | ICD-10-CM | POA: Diagnosis present

## 2018-08-26 DIAGNOSIS — Z86010 Personal history of colonic polyps: Secondary | ICD-10-CM | POA: Diagnosis not present

## 2018-08-26 DIAGNOSIS — Z820 Family history of epilepsy and other diseases of the nervous system: Secondary | ICD-10-CM | POA: Diagnosis not present

## 2018-08-26 DIAGNOSIS — J811 Chronic pulmonary edema: Secondary | ICD-10-CM | POA: Diagnosis present

## 2018-08-26 DIAGNOSIS — Z833 Family history of diabetes mellitus: Secondary | ICD-10-CM | POA: Diagnosis not present

## 2018-08-26 DIAGNOSIS — N189 Chronic kidney disease, unspecified: Secondary | ICD-10-CM | POA: Diagnosis present

## 2018-08-26 DIAGNOSIS — N185 Chronic kidney disease, stage 5: Secondary | ICD-10-CM | POA: Diagnosis present

## 2018-08-26 DIAGNOSIS — Z6841 Body Mass Index (BMI) 40.0 and over, adult: Secondary | ICD-10-CM | POA: Diagnosis not present

## 2018-08-26 LAB — CMP14+EGFR
ALT: 10 IU/L (ref 0–44)
AST: 9 IU/L (ref 0–40)
Albumin/Globulin Ratio: 1.7 (ref 1.2–2.2)
Albumin: 4.3 g/dL (ref 3.8–4.8)
Alkaline Phosphatase: 87 IU/L (ref 39–117)
BUN/Creatinine Ratio: 11 (ref 10–24)
BUN: 66 mg/dL — ABNORMAL HIGH (ref 8–27)
Bilirubin Total: 0.3 mg/dL (ref 0.0–1.2)
CO2: 13 mmol/L — CL (ref 20–29)
Calcium: 9.7 mg/dL (ref 8.6–10.2)
Chloride: 112 mmol/L — ABNORMAL HIGH (ref 96–106)
Creatinine, Ser: 6.15 mg/dL (ref 0.76–1.27)
GFR calc Af Amer: 10 mL/min/{1.73_m2} — ABNORMAL LOW (ref >59)
GFR calc non Af Amer: 9 mL/min/{1.73_m2} — ABNORMAL LOW (ref >59)
Globulin, Total: 2.5 g/dL (ref 1.5–4.5)
Glucose: 91 mg/dL (ref 65–99)
Potassium: 4.9 mmol/L (ref 3.5–5.2)
Sodium: 143 mmol/L (ref 134–144)
Total Protein: 6.8 g/dL (ref 6.0–8.5)

## 2018-08-26 LAB — CBC WITH DIFFERENTIAL/PLATELET
Abs Immature Granulocytes: 0.02 10*3/uL (ref 0.00–0.07)
BASOS ABS: 0 10*3/uL (ref 0.0–0.1)
Basophils Relative: 1 %
EOS PCT: 3 %
Eosinophils Absolute: 0.2 10*3/uL (ref 0.0–0.5)
HCT: 27 % — ABNORMAL LOW (ref 39.0–52.0)
Hemoglobin: 8.6 g/dL — ABNORMAL LOW (ref 13.0–17.0)
Immature Granulocytes: 0 %
Lymphocytes Relative: 22 %
Lymphs Abs: 1.8 10*3/uL (ref 0.7–4.0)
MCH: 27 pg (ref 26.0–34.0)
MCHC: 31.9 g/dL (ref 30.0–36.0)
MCV: 84.9 fL (ref 80.0–100.0)
Monocytes Absolute: 0.8 10*3/uL (ref 0.1–1.0)
Monocytes Relative: 10 %
NRBC: 0 % (ref 0.0–0.2)
Neutro Abs: 5.3 10*3/uL (ref 1.7–7.7)
Neutrophils Relative %: 64 %
Platelets: 260 10*3/uL (ref 150–400)
RBC: 3.18 MIL/uL — ABNORMAL LOW (ref 4.22–5.81)
RDW: 15.2 % (ref 11.5–15.5)
WBC: 8.3 10*3/uL (ref 4.0–10.5)

## 2018-08-26 LAB — COMPREHENSIVE METABOLIC PANEL
ALT: 10 U/L (ref 0–44)
ANION GAP: 15 (ref 5–15)
AST: 13 U/L — ABNORMAL LOW (ref 15–41)
Albumin: 3.4 g/dL — ABNORMAL LOW (ref 3.5–5.0)
Alkaline Phosphatase: 65 U/L (ref 38–126)
BUN: 76 mg/dL — ABNORMAL HIGH (ref 8–23)
CO2: 16 mmol/L — ABNORMAL LOW (ref 22–32)
Calcium: 9.5 mg/dL (ref 8.9–10.3)
Chloride: 112 mmol/L — ABNORMAL HIGH (ref 98–111)
Creatinine, Ser: 7.07 mg/dL — ABNORMAL HIGH (ref 0.61–1.24)
GFR calc Af Amer: 8 mL/min — ABNORMAL LOW (ref >60)
GFR calc non Af Amer: 7 mL/min — ABNORMAL LOW (ref >60)
Glucose, Bld: 97 mg/dL (ref 70–99)
Potassium: 4.7 mmol/L (ref 3.5–5.1)
Sodium: 143 mmol/L (ref 135–145)
TOTAL PROTEIN: 6.5 g/dL (ref 6.5–8.1)
Total Bilirubin: 0.8 mg/dL (ref 0.3–1.2)

## 2018-08-26 LAB — HEMOGLOBIN A1C
Hgb A1c MFr Bld: 5.4 % (ref 4.8–5.6)
Mean Plasma Glucose: 108.28 mg/dL

## 2018-08-26 LAB — LIPID PANEL
Chol/HDL Ratio: 7.5 ratio — ABNORMAL HIGH (ref 0.0–5.0)
Cholesterol, Total: 225 mg/dL — ABNORMAL HIGH (ref 100–199)
Cholesterol: 206 mg/dL — ABNORMAL HIGH (ref 0–200)
HDL: 30 mg/dL — ABNORMAL LOW (ref >39)
HDL: 24 mg/dL — ABNORMAL LOW (ref >40)
LDL Calculated: 152 mg/dL — ABNORMAL HIGH (ref 0–99)
LDL Cholesterol: 142 mg/dL — ABNORMAL HIGH (ref 0–99)
Total CHOL/HDL Ratio: 8.6 RATIO
Triglycerides: 214 mg/dL — ABNORMAL HIGH (ref 0–149)
Triglycerides: 199 mg/dL — ABNORMAL HIGH (ref <150)
VLDL Cholesterol Cal: 43 mg/dL — ABNORMAL HIGH (ref 5–40)
VLDL: 40 mg/dL (ref 0–40)

## 2018-08-26 LAB — TROPONIN I
TROPONIN I: 0.04 ng/mL — AB (ref <0.03)
Troponin I: 0.03 ng/mL (ref <0.03)
Troponin I: 0.04 ng/mL (ref <0.03)

## 2018-08-26 LAB — VITAMIN B12: Vitamin B-12: 356 pg/mL (ref 180–914)

## 2018-08-26 LAB — IRON AND TIBC
Iron: 31 ug/dL — ABNORMAL LOW (ref 45–182)
Saturation Ratios: 10 % — ABNORMAL LOW (ref 17.9–39.5)
TIBC: 301 ug/dL (ref 250–450)
UIBC: 270 ug/dL

## 2018-08-26 LAB — RETICULOCYTES
Immature Retic Fract: 13.3 % (ref 2.3–15.9)
RBC.: 3.25 MIL/uL — AB (ref 4.22–5.81)
Retic Count, Absolute: 49.1 10*3/uL (ref 19.0–186.0)
Retic Ct Pct: 1.5 % (ref 0.4–3.1)

## 2018-08-26 LAB — FOLATE: Folate: 16 ng/mL (ref >5.9)

## 2018-08-26 LAB — MAGNESIUM: Magnesium: 1.8 mg/dL (ref 1.7–2.4)

## 2018-08-26 LAB — HIV ANTIBODY (ROUTINE TESTING W REFLEX): HIV SCREEN 4TH GENERATION: NONREACTIVE

## 2018-08-26 LAB — PHOSPHORUS: Phosphorus: 6.3 mg/dL — ABNORMAL HIGH (ref 2.5–4.6)

## 2018-08-26 LAB — TSH: TSH: 2.84 u[IU]/mL (ref 0.450–4.500)

## 2018-08-26 LAB — FERRITIN: Ferritin: 60 ng/mL (ref 24–336)

## 2018-08-26 MED ORDER — HYDROXYZINE HCL 10 MG PO TABS: 5.0000 mg | ORAL_TABLET | Freq: Three times a day (TID) | ORAL | Status: DC | PRN

## 2018-08-26 MED ORDER — SODIUM CHLORIDE 0.9 % IV SOLN
510.0000 mg | Freq: Once | INTRAVENOUS | Status: AC
  Administered 2018-08-26: 510 mg via INTRAVENOUS
  Filled 2018-08-26: qty 17

## 2018-08-26 NOTE — Progress Notes (Signed)
New Admission Note:  Arrival Method: Via stretcher from ED Mental Orientation: Alert & Oriented x4 Telemetry: CCMD verified Assessment: Completed Skin: Refer to flowsheet IV: Left FA Pain: 0/10 Safety Measures: Safety Fall Prevention Plan discussed with patient. Admission: Completed [facility] [place] Orientation: Patient has been orientated to the room, unit and the staff.  Orders have been reviewed and are being implemented. Will continue to monitor the patient. Call light has been placed within reach.   Vassie Moselle, RN  Phone Number: [phone]

## 2018-08-26 NOTE — Progress Notes (Signed)
Pt refuse cpap for the night stating he was not going to be sleeping much due to lasix. RT informed pt to call for RT if he changes his mind during the night

## 2018-08-26 NOTE — Progress Notes (Signed)
  [facility] Progress Note    Assessment/ Plan:   1.  Acute pulm edema: mild, hopefully can resolve with IV Lasix.  Very resistant to have access placement or to start dialysis.  Having a good diuresis with IV Lasix- hopefully can transition to PO tomorrow.  2.  AKI on CKD V: as above- hopefully will not have to start dialysis during this admission.  Not uremic.  K OK.  Discussed access placement and crash-start dialysis- he has declined access placement.  3.  Anemia of CKD: monitor, iron deficient, will give feraheme  4.  Bone/ mineral: on calcitriol as OP, will check PTH and phos  5.  Dispo: pending   Subjective:    Feeling much better after IV Lasix.  Still a little increased WOB.  Continues to refuse dialysis access and preparations thereof.   Objective:   BP 113/86   Pulse 89   Temp 98 F (36.7 C) (Oral)   Resp (!) 21   Ht 5\' 11"  (1.803 m)   Wt (!) 150 kg   SpO2 100%   BMI 46.12 kg/m   Intake/Output Summary (Last 24 hours) at 08/26/2018 1216 Last data filed at 08/26/2018 0900 Gross per 24 hour  Intake -  Output 2000 ml  Net -2000 ml   Weight change:   Physical Exam: GEN NAD, sitting in chair HEENT EOMI PERRL NECK JVD improved PULM clear bilaterally, normal WOB, much improved from yesterday CV RRR no m/r/g ABD soft nontender NABS EXT 1+ bilateral LE edema, improving NEURO AAO x 3 nonfocal no asterixis  Imaging: Dg Chest 2 View  Result Date: 08/25/2018 CLINICAL DATA:  Shortness of breath. EXAM: CHEST - 2 VIEW COMPARISON:  CT chest dated January 28, 2010. Chest x-ray dated January 24, 2010. FINDINGS: Borderline cardiomegaly. Mild pulmonary vascular congestion. Small bilateral pleural effusions with bibasilar atelectasis. No pneumothorax. No acute osseous abnormality. IMPRESSION: 1. Pulmonary vascular congestion and small bilateral pleural effusions. 2. Bibasilar atelectasis. Electronically Signed   By: Titus Dubin M.D.   On: 08/25/2018 14:22     Labs: BMET Recent Labs  Lab 08/25/18 1533 08/25/18 1746 08/26/18 0904  NA 143 144 143  K 4.9 5.0 4.7  CL 112* 117* 112*  CO2 13* 12* 16*  GLUCOSE 91 85 97  BUN 66* 78* 76*  CREATININE 6.15* 6.91* 7.07*  CALCIUM 9.7 9.7 9.5  PHOS  --   --  6.3*   CBC Recent Labs  Lab 08/25/18 1746 08/26/18 0904  WBC 9.8 8.3  NEUTROABS 6.4 5.3  HGB 9.0* 8.6*  HCT 30.0* 27.0*  MCV 87.2 84.9  PLT 259 260    Medications:    . amLODipine  10 mg Oral Daily  . aspirin EC  81 mg Oral Daily  . calcitRIOL  0.25 mcg Oral Daily  . calcium-vitamin D  1 tablet Oral BID  . carvedilol  25 mg Oral Daily  . [START ON 08/27/2018] febuxostat  40 mg Oral QODAY  . furosemide  60 mg Intravenous Q12H  . heparin  5,000 Units Subcutaneous Q8H  . loratadine  10 mg Oral Daily  . multivitamin with minerals  1 tablet Oral Daily  . rosuvastatin  5 mg Oral q1800  . sodium bicarbonate  1,300 mg Oral BID     Madelon Lips MD [facility] pgr [phone] 08/26/2018, 12:16 PM

## 2018-08-26 NOTE — Progress Notes (Signed)
PROGRESS NOTE    Alex Williams  NTI:[id number] DOB: Jul 25, 1950 DOA: 08/25/2018 PCP: [name], MD   Brief Narrative:  HPI per Dr. Ivor Costa on 08/25/2018 Alex Williams is a 68 y.o. male with medical history significant of CKD-V, hypertension, hyperlipidemia, obesity, gout, OSA on CPAP, back pain, who presents with shortness of breath.  Patient states that he has been having shortness of breath for about 3 weeks, which has worsened in the past several days.  He does not have chest pain, fever or chills.  He has cough with clear mucus production currently.  No runny nose or sore throat.  Patient does not have nausea vomiting, diarrhea, abdominal pain. He has bilateral lower leg edema. Denies any symptoms for UTI.  No unilateral weakness.  Patient was seen by PCP at [facility] clinic today, and found to have abnormal EKG and bilateral pulmonary effusion, and therefore was sent to ED for further evaluation treatment.   **States Breathing has improved but Renal Function worsening. Nephrology following and continuing Diuresis.  Assessment & Plan:   Principal Problem:   Acute renal failure superimposed on stage 5 chronic kidney disease, not on chronic dialysis (HCC) Active Problems:   HYPERCHOLESTEROLEMIA   GOUT   Obstructive sleep apnea   Essential hypertension   Anemia in CKD (chronic kidney disease)   Elevated troponin   Acute pulmonary edema (HCC)   Acute on chronic renal failure (HCC)  Acute Pulmonary Edema in the setting of worsening Renal Failure (AKI on CKD Stage 5 not on chronic Dialysis) -Admitted to Inpatient Telemetry -Patient's SOB is likely due to acute pulmonary edema. Pt has bilateral leg edema.   -Chest x-ray showed "Borderline cardiomegaly. Mild pulmonary vascular congestion. Small bilateral pleural effusions with bibasilar atelectasis. No pneumothorax. No acute osseous abnormality." -Patient has history of CKD-V, which has worsened. Baseline Cre is 3.0-4.0 last year in  May-Aug 2019, then 5.30 on 02/11/18. pt's Cre is 6.91 and BUN 78 on admission.  This is likely due to gradual progression of CKD-V.  -BNP was 513.0; Will consider repeating an ECHOCardiogram -Pt is taking Lasix 40 daily at home.  -C/w IV Diuretics with Furosemide 60 mg BID.   -Neprhology Dr. Hollie Salk was consulted by EDP and appreciate further evaluation and recommendations -Patient has been resistant to Dialysis Discussions and Planning  -C/w prn albuterol nebs for SOB and Mucinex DM for cough -Repeat CMP in AM -Avoid Nephrotoxic Medications and Contrast Dyes -Continue to Follow Nephrology Recommendations; Nephrology hopong he does not have to start dialysi this Admission and they have discussed Access Placement and he has been refusing   HLD -Initial note showed a total cholesterol/HDL ratio of 7.5, total cholesterol 225, HDL of 30, LDL 152, triglycerides of 214, and VLDL cholesterol of 43 and was on 08/25/2018 -Repeat cholesterol panel on 08/26/2018 showed total cholesterol/HDL ratio of 8.6, cholesterol level of 206, HDL of 24, LDL of 142, triglycerides of 199, and VLDL 40 -Continue with Ativan 5 mg p.o. daily and may likely need to be increased  AKI on CKD Stage 5 Hyperphosphatemia  -BUN/Cr has been worsening and went from 66/6.15 -> 78/6.91 -> 76/7.07 -Nephrology Following and appreciated further recommendations -C/w Calcitriol 0.25 mcg po Daily and Calcium-Vitamin D 1 tab BID  -Currently not Uremic  -C/w Sodium Bicarbonate 1300 mg po BID -Nephrology checking PTH and Phos  -Continue to Monitor and Trend Renal Function -Repeat CMP in AM   Metabolic Acidosis -AG was 15 and CO2 16 -C/w  Diuresis and Nephrology Following and will continue Sodium Bicarbonate 1300 mg po BID -Repeat CMP in AM   Normocytic anemia/anemia chronic kidney disease stage V -Patient's hemoglobin/hematocrit went from 9.0/30.0 and is now 8.6/27.0 -Anemia panel done and showed an iron level of 31, U IBC of 270, TIBC of  301, saturation ratios of 10%, ferritin level 60, folate level of 16.0, and vitamin B12 level of 356 -Patient was written for ferumoxytol 510 mg tabs once -Continue to monitor for signs and symptoms of bleeding -Repeat CBC in the a.m.  Gout -Continue with of Oxistat 40 mg p.o. every other day  Hypertension -Continue home medications of amlodipine 10 mg p.o. daily, carvedilol 25 mg p.o. daily -Patient is also getting diuresis with IV Lasix 60 mg twice daily -Continue with IV hydralazine 5 mg ER for high blood pressure for systolic blood pressure Greater than 175 -BP this Afternoon was improved and 133/77  OSA -C/w Nocturnal CPAP  Elevated Troponin  -Relatively flat as was 0.03 -> 0.03 -> 0.04 -> 0.04 -Denies any chest pain at this time but only intermittent shortness of breath -Likely in the setting of acute on chronic kidney disease stage V -Troponin cycled -Continue ASA 81 mg po daily and Rosuvastatin 5 mg po Daily  -No CP but had NTG 0.4 sL q59min PRN Chest Pain added  Morbid Obesity -Estimated body mass index is 46.12 kg/m as calculated from the following:   Height as of this encounter: 5\' 11"  (1.803 m).   Weight as of this encounter: 150 kg., -Weight Loss Counseling given   DVT prophylaxis: Heparin 5000 units subcu q. 8 Code Status: FULL CODE Family Communication: No family present at bedside  Disposition Plan: Remain Inpatient for continued Diuresis and improvement in Respiratory Status  Consultants:  Nephrology   Procedures:  None   Antimicrobials:  Anti-infectives (From admission, onward)   None     Subjective: And examined at bedside he is sitting in the chair and states his breathing was improved.  No nausea or vomiting.  Denies any chest pain, still hesitant and resistant to starting dialysis and climbing access placement..  No other concerns or complaints at this time and hopeful that Lasix can help him get through this  hospitalization.  Objective: Vitals:   08/26/18 0351 08/26/18 0650 08/26/18 0948 08/26/18 1552  BP: (!) 160/92 131/72 113/86 133/77  Pulse: 88 85 89 80  Resp: (!) 21   20  Temp: 98 F (36.7 C)   98.3 F (36.8 C)  TempSrc: Oral   Oral  SpO2: 100%   98%  Weight:      Height:        Intake/Output Summary (Last 24 hours) at 08/26/2018 1622 Last data filed at 08/26/2018 1410 Gross per 24 hour  Intake 480 ml  Output 2900 ml  Net -2420 ml   Filed Weights   08/26/18 0346  Weight: (!) 150 kg   Examination: Physical Exam:  Constitutional: WN/WD morbidly obese Caucasian male in NAD and appears calm and comfortable Eyes: Lids and conjunctivae normal, sclerae anicteric  ENMT: External Ears, Nose appear normal. Grossly normal hearing. Mucous membranes are moist Neck: Appears normal, supple, no cervical masses, normal ROM, no appreciable thyromegaly; no JVD Respiratory: Diminished to auscultation bilaterally with some crackles but no appreciable wheezing, rales, rhonchi. Normal respiratory effort and patient is not tachypenic. No accessory muscle use.  Cardiovascular: RRR, no murmurs / rubs / gallops. S1 and S2 auscultated. 2_ LE extremity edema.  Abdomen:  Soft, non-tender, Distended due to body habitus. No masses palpated. No appreciable hepatosplenomegaly. Bowel sounds positive.  GU: Deferred. Musculoskeletal: No clubbing / cyanosis of digits/nails. No joint deformity upper and lower extremities.  Skin: No rashes, lesions, ulcers on a limited skin evaluation. No induration; Warm and dry.  Neurologic: CN 2-12 grossly intact with no focal deficits. Romberg sign and cerebellar reflexes not assessed.  Psychiatric: Normal judgment and insight. Alert and oriented x 3. Normal mood and appropriate affect.   Data Reviewed: I have personally reviewed following labs and imaging studies  CBC: Recent Labs  Lab 08/25/18 1746 08/26/18 0904  WBC 9.8 8.3  NEUTROABS 6.4 5.3  HGB 9.0* 8.6*  HCT  30.0* 27.0*  MCV 87.2 84.9  PLT 259 053   Basic Metabolic Panel: Recent Labs  Lab 08/25/18 1533 08/25/18 1746 08/26/18 0904  NA 143 144 143  K 4.9 5.0 4.7  CL 112* 117* 112*  CO2 13* 12* 16*  GLUCOSE 91 85 97  BUN 66* 78* 76*  CREATININE 6.15* 6.91* 7.07*  CALCIUM 9.7 9.7 9.5  MG  --   --  1.8  PHOS  --   --  6.3*   GFR: Estimated Creatinine Clearance: 14.9 mL/min (A) (by C-G formula based on SCr of 7.07 mg/dL (H)). Liver Function Tests: Recent Labs  Lab 08/25/18 1533 08/25/18 1746 08/26/18 0904  AST 9 14* 13*  ALT 10 12 10   ALKPHOS 87 61 65  BILITOT 0.3 0.7 0.8  PROT 6.8 6.6 6.5  ALBUMIN 4.3 3.6 3.4*   No results for input(s): LIPASE, AMYLASE in the last 168 hours. No results for input(s): AMMONIA in the last 168 hours. Coagulation Profile: No results for input(s): INR, PROTIME in the last 168 hours. Cardiac Enzymes: Recent Labs  Lab 08/25/18 1746 08/25/18 2339 08/26/18 0317 08/26/18 0904  TROPONINI 0.03* 0.03* 0.04* 0.04*   BNP (last 3 results) No results for input(s): PROBNP in the last 8760 hours. HbA1C: Recent Labs    08/26/18 0317  HGBA1C 5.4   CBG: No results for input(s): GLUCAP in the last 168 hours. Lipid Profile: Recent Labs    08/25/18 1533 08/26/18 0317  CHOL 225* 206*  HDL 30* 24*  LDLCALC 152* 142*  TRIG 214* 199*  CHOLHDL 7.5* 8.6   Thyroid Function Tests: Recent Labs    08/25/18 1533  TSH 2.840   Anemia Panel: Recent Labs    08/25/18 2339  VITAMINB12 356  FOLATE 16.0  FERRITIN 60  TIBC 301  IRON 31*  RETICCTPCT 1.5   Sepsis Labs: No results for input(s): PROCALCITON, LATICACIDVEN in the last 168 hours.  No results found for this or any previous visit (from the past 240 hour(s)).   Radiology Studies: Dg Chest 2 View  Result Date: 08/25/2018 CLINICAL DATA:  Shortness of breath. EXAM: CHEST - 2 VIEW COMPARISON:  CT chest dated January 28, 2010. Chest x-ray dated January 24, 2010. FINDINGS: Borderline cardiomegaly.  Mild pulmonary vascular congestion. Small bilateral pleural effusions with bibasilar atelectasis. No pneumothorax. No acute osseous abnormality. IMPRESSION: 1. Pulmonary vascular congestion and small bilateral pleural effusions. 2. Bibasilar atelectasis. Electronically Signed   By: Titus Dubin M.D.   On: 08/25/2018 14:22   Scheduled Meds: . amLODipine  10 mg Oral Daily  . aspirin EC  81 mg Oral Daily  . calcitRIOL  0.25 mcg Oral Daily  . calcium-vitamin D  1 tablet Oral BID  . carvedilol  25 mg Oral Daily  . [START ON  08/27/2018] febuxostat  40 mg Oral QODAY  . furosemide  60 mg Intravenous Q12H  . heparin  5,000 Units Subcutaneous Q8H  . loratadine  10 mg Oral Daily  . multivitamin with minerals  1 tablet Oral Daily  . rosuvastatin  5 mg Oral q1800  . sodium bicarbonate  1,300 mg Oral BID   Continuous Infusions:   LOS: 0 days   Kerney Elbe, DO [facility] PAGER is on AMION  If 7PM-7AM, please contact night-coverage [email or web address] Password [email or web address] 08/26/2018, 4:22 PM

## 2018-08-27 LAB — COMPREHENSIVE METABOLIC PANEL
ALT: 11 U/L (ref 0–44)
AST: 13 U/L — ABNORMAL LOW (ref 15–41)
Albumin: 3.3 g/dL — ABNORMAL LOW (ref 3.5–5.0)
Alkaline Phosphatase: 60 U/L (ref 38–126)
Anion gap: 15 (ref 5–15)
BUN: 78 mg/dL — ABNORMAL HIGH (ref 8–23)
CO2: 18 mmol/L — ABNORMAL LOW (ref 22–32)
CREATININE: 7.45 mg/dL — AB (ref 0.61–1.24)
Calcium: 9.5 mg/dL (ref 8.9–10.3)
Chloride: 111 mmol/L (ref 98–111)
GFR calc Af Amer: 8 mL/min — ABNORMAL LOW (ref >60)
GFR calc non Af Amer: 7 mL/min — ABNORMAL LOW (ref >60)
Glucose, Bld: 93 mg/dL (ref 70–99)
Potassium: 4.4 mmol/L (ref 3.5–5.1)
Sodium: 144 mmol/L (ref 135–145)
TOTAL PROTEIN: 6.3 g/dL — AB (ref 6.5–8.1)
Total Bilirubin: 0.9 mg/dL (ref 0.3–1.2)

## 2018-08-27 LAB — CBC WITH DIFFERENTIAL/PLATELET
Abs Immature Granulocytes: 0.01 10*3/uL (ref 0.00–0.07)
Basophils Absolute: 0.1 10*3/uL (ref 0.0–0.1)
Basophils Relative: 1 %
Eosinophils Absolute: 0.3 10*3/uL (ref 0.0–0.5)
Eosinophils Relative: 3 %
HEMATOCRIT: 27.7 % — AB (ref 39.0–52.0)
Hemoglobin: 8.6 g/dL — ABNORMAL LOW (ref 13.0–17.0)
Immature Granulocytes: 0 %
LYMPHS ABS: 2.3 10*3/uL (ref 0.7–4.0)
Lymphocytes Relative: 28 %
MCH: 26.3 pg (ref 26.0–34.0)
MCHC: 31 g/dL (ref 30.0–36.0)
MCV: 84.7 fL (ref 80.0–100.0)
MONO ABS: 0.9 10*3/uL (ref 0.1–1.0)
MONOS PCT: 11 %
Neutro Abs: 4.6 10*3/uL (ref 1.7–7.7)
Neutrophils Relative %: 57 %
Platelets: 240 10*3/uL (ref 150–400)
RBC: 3.27 MIL/uL — ABNORMAL LOW (ref 4.22–5.81)
RDW: 15.2 % (ref 11.5–15.5)
WBC: 8.1 10*3/uL (ref 4.0–10.5)
nRBC: 0 % (ref 0.0–0.2)

## 2018-08-27 LAB — PHOSPHORUS: Phosphorus: 6.9 mg/dL — ABNORMAL HIGH (ref 2.5–4.6)

## 2018-08-27 LAB — MAGNESIUM: MAGNESIUM: 2 mg/dL (ref 1.7–2.4)

## 2018-08-27 MED ORDER — FUROSEMIDE 80 MG PO TABS
80.0000 mg | ORAL_TABLET | Freq: Every day | ORAL | Status: DC
  Administered 2018-08-27 – 2018-08-28 (×2): 80 mg via ORAL
  Filled 2018-08-27 (×2): qty 1

## 2018-08-27 NOTE — Progress Notes (Signed)
PROGRESS NOTE    Alex Williams  CWC:[id number] DOB: 20-Dec-1950 DOA: 08/25/2018 PCP: [name], MD   Brief Narrative:  HPI on 08/25/2018 by Dr. Darlyne Russian is a 68 y.o. male with medical history significant of CKD-V, hypertension, hyperlipidemia, obesity, gout, OSA on CPAP, back pain, who presents with shortness of breath.  Patient states that he has been having shortness of breath for about 3 weeks, which has worsened in the past several days.  He does not have chest pain, fever or chills.  He has cough with clear mucus production currently.  No runny nose or sore throat.  Patient does not have nausea vomiting, diarrhea, abdominal pain. He has bilateral lower leg edema. Denies any symptoms for UTI.  No unilateral weakness.  Patient was seen by PCP at [facility] clinic today, and found to have abnormal EKG and bilateral pulmonary effusion, and therefore was sent to ED for further evaluation treatment.   Interim history Admitted with acute pulmonary edema in the setting of worsening renal failure, nephrology consulted.  Currently on Lasix.  Assessment & Plan   Acute pulmonary edema in setting of worsening renal function/acute kidney injury on chronic kidney disease, stage V -Patient presented with shortness of breath and bilateral leg edema.  Chest x-ray revealed vascular congestion and small bilateral pleural effusions. -Upon admission, creatinine 6.91, and has worsened today to 7.45. -BNP 513 on admission. -Patient has been taking Lasix 40 mg daily at home (supposed to be taking 40 mg twice daily) -Nephrology consulted and appreciated -Patient does not wish to have dialysis at this time or even discuss it. -Patient was given IV Lasix, discussed with nephrology, Dr. Hollie Salk, will place on 80 mg oral Lasix today and monitor.  Hyperlipidemia -Continue statin  Hypophosphatemia -To be managed by nephrology  Metabolic acidosis -Likely secondary to worsening renal  function -Continue sodium bicarb  Normocytic anemia/anemia of chronic disease -Stable, continue to monitor CBC -Given Feraheme  Gout -Stable, continue Oxistat  Essential hypertension -Continue amlodipine, Coreg, Lasix  OSA -Continue CPAP  Elevated troponin -Relatively flat, no complaints of chest pain. -Continue statin, aspirin  Morbid obesity -BMI 46.12 -Follow-up with PCP to discuss lifestyle modifications  DVT Prophylaxis  heparin  Code Status: Full  Family Communication: None at bedside  Disposition Plan: Admitted. Suspect home in 1-2 days  Consultants Nephrology  Procedures  None  Antibiotics   Anti-infectives (From admission, onward)   None      Subjective:   Alex Williams seen and examined today.  Patient feels breathing has improved however not back to his baseline.  Denies current chest pain, domino pain, nausea vomiting, diarrhea constipation, dizziness or headache.  Objective:   Vitals:   08/26/18 1552 08/26/18 2143 08/27/18 0401 08/27/18 0938  BP: 133/77 136/80 131/76 124/68  Pulse: 80 80 77 82  Resp: 20 18 18 18   Temp: 98.3 F (36.8 C) 98 F (36.7 C) 98.2 F (36.8 C) (!) 97.5 F (36.4 C)  TempSrc: Oral Oral Oral Oral  SpO2: 98% 98% 98% 98%  Weight:  (!) 151.3 kg    Height:        Intake/Output Summary (Last 24 hours) at 08/27/2018 1542 Last data filed at 08/27/2018 1300 Gross per 24 hour  Intake 960 ml  Output 1600 ml  Net -640 ml   Filed Weights   08/26/18 0346 08/26/18 2143  Weight: (!) 150 kg (!) 151.3 kg    Exam  General: Well developed, well nourished, NAD,  appears stated age  11: NCAT, mucous membranes moist.   Neck: Supple  Cardiovascular: S1 S2 auscultated, no murmur, RRR  Respiratory: Clear to auscultation bilaterally with equal chest rise  Abdomen: Soft, nontender, nondistended, + bowel sounds  Extremities: warm dry without cyanosis clubbing or edema  Neuro: AAOx3, nonfocal  Psych: Normal affect and  demeanor with intact judgement and insight   Data Reviewed: I have personally reviewed following labs and imaging studies  CBC: Recent Labs  Lab 08/25/18 1746 08/26/18 0904 08/27/18 0543  WBC 9.8 8.3 8.1  NEUTROABS 6.4 5.3 4.6  HGB 9.0* 8.6* 8.6*  HCT 30.0* 27.0* 27.7*  MCV 87.2 84.9 84.7  PLT 259 260 009   Basic Metabolic Panel: Recent Labs  Lab 08/25/18 1533 08/25/18 1746 08/26/18 0904 08/27/18 0543  NA 143 144 143 144  K 4.9 5.0 4.7 4.4  CL 112* 117* 112* 111  CO2 13* 12* 16* 18*  GLUCOSE 91 85 97 93  BUN 66* 78* 76* 78*  CREATININE 6.15* 6.91* 7.07* 7.45*  CALCIUM 9.7 9.7 9.5 9.5  MG  --   --  1.8 2.0  PHOS  --   --  6.3* 6.9*   GFR: Estimated Creatinine Clearance: 14.2 mL/min (A) (by C-G formula based on SCr of 7.45 mg/dL (H)). Liver Function Tests: Recent Labs  Lab 08/25/18 1533 08/25/18 1746 08/26/18 0904 08/27/18 0543  AST 9 14* 13* 13*  ALT 10 12 10 11   ALKPHOS 87 61 65 60  BILITOT 0.3 0.7 0.8 0.9  PROT 6.8 6.6 6.5 6.3*  ALBUMIN 4.3 3.6 3.4* 3.3*   No results for input(s): LIPASE, AMYLASE in the last 168 hours. No results for input(s): AMMONIA in the last 168 hours. Coagulation Profile: No results for input(s): INR, PROTIME in the last 168 hours. Cardiac Enzymes: Recent Labs  Lab 08/25/18 1746 08/25/18 2339 08/26/18 0317 08/26/18 0904  TROPONINI 0.03* 0.03* 0.04* 0.04*   BNP (last 3 results) No results for input(s): PROBNP in the last 8760 hours. HbA1C: Recent Labs    08/26/18 0317  HGBA1C 5.4   CBG: No results for input(s): GLUCAP in the last 168 hours. Lipid Profile: Recent Labs    08/25/18 1533 08/26/18 0317  CHOL 225* 206*  HDL 30* 24*  LDLCALC 152* 142*  TRIG 214* 199*  CHOLHDL 7.5* 8.6   Thyroid Function Tests: Recent Labs    08/25/18 1533  TSH 2.840   Anemia Panel: Recent Labs    08/25/18 2339  VITAMINB12 356  FOLATE 16.0  FERRITIN 60  TIBC 301  IRON 31*  RETICCTPCT 1.5   Urine analysis:     Component Value Date/Time   COLORURINE DK. YELLOW 01/20/2009 0918   APPEARANCEUR Clear 08/09/2017 1116   LABSPEC >=1.030 01/20/2009 0918   PHURINE 5.0 01/20/2009 0918   GLUCOSEU Negative 08/09/2017 1116   GLUCOSEU NEGATIVE 01/20/2009 0918   BILIRUBINUR Negative 08/09/2017 1116   KETONESUR negative 12/01/2016 0822   KETONESUR TRACE 01/20/2009 0918   PROTEINUR 3+ (A) 08/09/2017 1116   UROBILINOGEN 0.2 12/01/2016 0822   UROBILINOGEN 0.2 01/20/2009 0918   NITRITE Negative 08/09/2017 1116   NITRITE NEGATIVE 01/20/2009 0918   LEUKOCYTESUR Negative 08/09/2017 1116   Sepsis Labs: @LABRCNTIP (procalcitonin:4,lacticidven:4)  )No results found for this or any previous visit (from the past 240 hour(s)).    Radiology Studies: No results found.   Scheduled Meds: . amLODipine  10 mg Oral Daily  . aspirin EC  81 mg Oral Daily  . calcitRIOL  0.25 mcg Oral Daily  . calcium-vitamin D  1 tablet Oral BID  . carvedilol  25 mg Oral Daily  . febuxostat  40 mg Oral QODAY  . furosemide  80 mg Oral Daily  . heparin  5,000 Units Subcutaneous Q8H  . loratadine  10 mg Oral Daily  . multivitamin with minerals  1 tablet Oral Daily  . rosuvastatin  5 mg Oral q1800  . sodium bicarbonate  1,300 mg Oral BID   Continuous Infusions:   LOS: 1 day   Time Spent in minutes   30 minutes  Marrian Bells D.O. on 08/27/2018 at 3:42 PM  Between 7am to 7pm - Please see pager noted on [email or web address]  After 7pm go to [email or web address]  And look for the night coverage person covering for me after hours  [facility] Office  [phone]

## 2018-08-27 NOTE — Progress Notes (Signed)
[facility] Progress Note    Assessment/ Plan:   1.  Pulmonary edema: Mild and appears to have improved with IV Lasix.  Patient likely will require dialysis however has had good diuresis with IV Lasix.  Will transition to oral Lasix twice daily monitor to ensure that patient remained stable on this.  2.  AKI on CKD stage V: Patient remains resistant to have access placement or to start dialysis.  Currently with no indication for emergent dialysis with normal BUN and electrolytes.  We will continue to educate patient on need for access placement vs risks of starting crash-start dialysis.  Continue calcitriol, phosphorus elevated at 6.9.  3.  Anemia of CKD: Monitor, s/p feraheme  Dispo pending improvement Subjective:   Patient states that he is feeling much better today and thinks that his swelling has resolved.  States that he is going to the bathroom every 45 minutes to 1 hour.   Objective:   BP 124/68 (BP Location: Right Arm)   Pulse 82   Temp (!) 97.5 F (36.4 C) (Oral)   Resp 18   Ht 5\' 11"  (1.803 m)   Wt (!) 151.3 kg   SpO2 98%   BMI 46.52 kg/m   Intake/Output Summary (Last 24 hours) at 08/27/2018 1412 Last data filed at 08/27/2018 0900 Gross per 24 hour  Intake 720 ml  Output 1600 ml  Net -880 ml   Physical Exam: General: NAD, pleasant, laying in bed Cardiovascular: RRR, no m/r/g, mild LE edema Respiratory: CTA BL- exam difficult due to body habitus, normal work of breathing MSK: moves 4 extremities equally Derm: no rashes appreciated Neuro: CN II-XII grossly intact Psych: AOx3, appropriate affect  Imaging: Dg Chest 2 View  Result Date: 08/25/2018 CLINICAL DATA:  Shortness of breath. EXAM: CHEST - 2 VIEW COMPARISON:  CT chest dated January 28, 2010. Chest x-ray dated January 24, 2010. FINDINGS: Borderline cardiomegaly. Mild pulmonary vascular congestion. Small bilateral pleural effusions with bibasilar atelectasis. No pneumothorax. No acute osseous abnormality.  IMPRESSION: 1. Pulmonary vascular congestion and small bilateral pleural effusions. 2. Bibasilar atelectasis. Electronically Signed   By: Titus Dubin M.D.   On: 08/25/2018 14:22    Labs: BMET Recent Labs  Lab 08/25/18 1533 08/25/18 1746 08/26/18 0904 08/27/18 0543  NA 143 144 143 144  K 4.9 5.0 4.7 4.4  CL 112* 117* 112* 111  CO2 13* 12* 16* 18*  GLUCOSE 91 85 97 93  BUN 66* 78* 76* 78*  CREATININE 6.15* 6.91* 7.07* 7.45*  CALCIUM 9.7 9.7 9.5 9.5  PHOS  --   --  6.3* 6.9*   CBC Recent Labs  Lab 08/25/18 1746 08/26/18 0904 08/27/18 0543  WBC 9.8 8.3 8.1  NEUTROABS 6.4 5.3 4.6  HGB 9.0* 8.6* 8.6*  HCT 30.0* 27.0* 27.7*  MCV 87.2 84.9 84.7  PLT 259 260 240    Medications:    . amLODipine  10 mg Oral Daily  . aspirin EC  81 mg Oral Daily  . calcitRIOL  0.25 mcg Oral Daily  . calcium-vitamin D  1 tablet Oral BID  . carvedilol  25 mg Oral Daily  . febuxostat  40 mg Oral QODAY  . furosemide  80 mg Oral Daily  . heparin  5,000 Units Subcutaneous Q8H  . loratadine  10 mg Oral Daily  . multivitamin with minerals  1 tablet Oral Daily  . rosuvastatin  5 mg Oral q1800  . sodium bicarbonate  1,300 mg Oral BID  Martinique Aubert Choyce, DO PGY-2, [facility]  08/27/2018, 12:13PM

## 2018-08-28 LAB — RENAL FUNCTION PANEL
ALBUMIN: 3.4 g/dL — AB (ref 3.5–5.0)
Anion gap: 12 (ref 5–15)
BUN: 82 mg/dL — AB (ref 8–23)
CO2: 19 mmol/L — ABNORMAL LOW (ref 22–32)
Calcium: 9.4 mg/dL (ref 8.9–10.3)
Chloride: 111 mmol/L (ref 98–111)
Creatinine, Ser: 7.66 mg/dL — ABNORMAL HIGH (ref 0.61–1.24)
GFR calc Af Amer: 8 mL/min — ABNORMAL LOW (ref >60)
GFR, EST NON AFRICAN AMERICAN: 7 mL/min — AB (ref >60)
Glucose, Bld: 96 mg/dL (ref 70–99)
PHOSPHORUS: 6.6 mg/dL — AB (ref 2.5–4.6)
Potassium: 4.2 mmol/L (ref 3.5–5.1)
Sodium: 142 mmol/L (ref 135–145)

## 2018-08-28 MED ORDER — FUROSEMIDE 80 MG PO TABS: 80.0000 mg | ORAL_TABLET | Freq: Every day | ORAL | 0 refills | Status: DC

## 2018-08-28 MED ORDER — CARVEDILOL 25 MG PO TABS: 25.0000 mg | ORAL_TABLET | Freq: Every day | ORAL | Status: DC

## 2018-08-28 NOTE — Discharge Summary (Signed)
Physician Discharge Summary  [facility] OAC:[id number] DOB: [birth year] DOA: 08/25/2018  PCP: [name], MD  Admit date: 08/25/2018 Discharge date: 08/28/2018  Time spent: 45 minutes  Recommendations for Outpatient Follow-up:  Patient will be discharged to home.  Patient will need to follow up with primary care provider within one week of discharge, repeat CBC, BMP.  Follow up with [name], nephrology. Patient should continue medications as prescribed.  Patient should follow a renal diet with fluid restriction 1249ml per day.   Discharge Diagnoses:  Principal Problem: Acute pulmonary edema in setting of worsening renal function/acute kidney injury on chronic kidney disease, stage V Hyperlipidemia Hypophosphatemia Metabolic acidosis Normocytic anemia/anemia of chronic disease Gout Essential hypertension OSA Elevated troponin Morbid obesity  Discharge Condition: Stable  Diet recommendation: renal diet with fluild restriction   Filed Weights   08/26/18 0346 08/26/18 2143 08/27/18 2056  Weight: (!) 150 kg (!) 151.3 kg (!) 151.3 kg    History of present illness:  on 08/25/2018 by Dr. Clement [name] A [facility] a 68 y.o.malewith medical history significant ofCKD-V,hypertension, hyperlipidemia, obesity, gout, OSA on CPAP, back pain, who presents with shortness of breath.  Patient states that he has been having shortness of breath for about 3 weeks, which has worsened in the past several days. He does not have chest pain, fever or chills. He has cough with clear mucus production currently. No runny nose or sore throat. Patient does not have nausea vomiting, diarrhea, abdominal pain. He hasbilateral lower leg edema.Denies any symptoms for UTI. No unilateral weakness. Patient was seen by PCP at [facility] today, and found to have abnormal EKG and bilateral pulmonary effusion, and therefore was sent to ED for further evaluation treatment.  Hospital Course:    Acute pulmonary edema in setting of worsening renal function/acute kidney injury on chronic kidney disease, stage V -Patient presented with shortness of breath and bilateral leg edema.  Chest x-ray revealed vascular congestion and small bilateral pleural effusions. -Upon admission, creatinine 6.91, and has worsened today to 7.66 -BNP 513 on admission. -Patient has been taking Lasix 40 mg daily at home (supposed to be taking 40 mg twice daily) -Nephrology consulted and appreciated -Patient does not wish to have dialysis at this time or even discuss it. -Placed patient on oral Lasix 80 mg daily.  Discussed with Dr. Hollie Salk, nephrology, recommended discharge with close outpatient follow-up with [name].  Hyperlipidemia -Continue statin  Hypophosphatemia -To be managed by nephrology  Metabolic acidosis -Likely secondary to worsening renal function -Continue sodium bicarb  Normocytic anemia/anemia of chronic disease -Stable -Given Feraheme  Gout -Stable, continue Oxistat  Essential hypertension -Continue amlodipine, Coreg, Lasix  OSA -Continue CPAP  Elevated troponin -Relatively flat, no complaints of chest pain. -Continue statin, aspirin  Morbid obesity -BMI 46.12 -Follow-up with PCP to discuss lifestyle modifications  Consultants Nephrology  Procedures  None  Discharge Exam: Vitals:   08/28/18 0505 08/28/18 0907  BP: 120/73 125/70  Pulse: 79 84  Resp: 18 20  Temp: 98.2 F (36.8 C) 98.4 F (36.9 C)  SpO2: 97% 97%   Feels breathing has improved as compared to previous days.  Denies current chest pain, abdominal pain, nausea or vomiting, diarrhea or constipation, dizziness or headache.  Would like to go home today.   General: Well developed, well nourished, NAD, appears stated age  68: NCAT, mucous membranes moist.  Neck: Supple  Cardiovascular: S1 S2 auscultated, RRR  Respiratory: Clear to auscultation bilaterally with equal chest  rise  Abdomen: Soft, obese, nontender, nondistended, + bowel sounds  Extremities: warm dry without cyanosis clubbing or edema  Neuro: AAOx3, nonfocal  Psych: Does not, appropriate mood and affect  Discharge Instructions Discharge Instructions    Discharge instructions   Complete by:  As directed    Patient will be discharged to home.  Patient will need to follow up with primary care provider within one week of discharge, repeat CBC, BMP.  Follow up with [name], nephrology. Patient should continue medications as prescribed.  Patient should follow a renal diet with fluid restriction 1263ml per day.     Allergies as of 08/28/2018      Reactions   Codeine Phosphate       Medication List    TAKE these medications   acetaminophen 500 MG tablet Commonly known as:  TYLENOL Take 500 mg by mouth See admin instructions. Takes 1000mg  in the morning and 1000mg  in the evening.   amLODipine 10 MG tablet Commonly known as:  NORVASC Take 1 tablet (10 mg total) by mouth daily.   aspirin 81 MG tablet Take 81 mg by mouth daily.   calcium-vitamin D 250-100 MG-UNIT tablet Take 1 tablet by mouth 2 (two) times daily.   carvedilol 25 MG tablet Commonly known as:  COREG Take 25 mg by mouth daily.   cetirizine 10 MG tablet Commonly known as:  ZYRTEC Take 10 mg by mouth daily as needed for allergies.   diphenhydramine-acetaminophen 25-500 MG Tabs tablet Commonly known as:  TYLENOL PM Take 1 tablet by mouth at bedtime as needed (sleep).   furosemide 80 MG tablet Commonly known as:  LASIX Take 1 tablet (80 mg total) by mouth daily. Start taking on:  August 29, 2018 What changed:    medication strength  how much to take  when to take this   ONE-A-DAY MENS 50+ ADVANTAGE PO Take 1 tablet by mouth daily.   rosuvastatin 5 MG tablet Commonly known as:  CRESTOR Take 1 tablet (5 mg total) by mouth daily.   sodium bicarbonate 650 MG tablet Take 1 tablet (650 mg total) by  mouth 2 (two) times daily.   traMADol 50 MG tablet Commonly known as:  ULTRAM Take 1 tablet (50 mg total) by mouth every 12 (twelve) hours as needed.   ULORIC 40 MG tablet Generic drug:  febuxostat Take 40 mg by mouth every other day.      Allergies  Allergen Reactions  . Codeine Phosphate    Follow-up Information    [name], MD. Schedule an appointment as soon as possible for a visit in 1 week(s).   Specialty:  Family Medicine Why:  Hospital follow up Contact information: [address] [place] [address] [phone]        Corliss Parish, MD. Schedule an appointment as soon as possible for a visit in 1 week(s).   Specialty:  Nephrology Why:  Hospital follow up Contact information: [place] [id number] [phone]            The results of significant diagnostics from this hospitalization (including imaging, microbiology, ancillary and laboratory) are listed below for reference.    Significant Diagnostic Studies: Dg Chest 2 View  Result Date: 08/25/2018 CLINICAL DATA:  Shortness of breath. EXAM: CHEST - 2 VIEW COMPARISON:  CT chest dated January 28, 2010. Chest x-ray dated January 24, 2010. FINDINGS: Borderline cardiomegaly. Mild pulmonary vascular congestion. Small bilateral pleural effusions with bibasilar atelectasis. No pneumothorax. No acute osseous abnormality. IMPRESSION:  1. Pulmonary vascular congestion and small bilateral pleural effusions. 2. Bibasilar atelectasis. Electronically Signed   By: Titus Dubin M.D.   On: 08/25/2018 14:22    Microbiology: No results found for this or any previous visit (from the past 240 hour(s)).   Labs: Basic Metabolic Panel: Recent Labs  Lab 08/25/18 1533 08/25/18 1746 08/26/18 0904 08/27/18 0543 08/28/18 0633  NA 143 144 143 144 142  K 4.9 5.0 4.7 4.4 4.2  CL 112* 117* 112* 111 111  CO2 13* 12* 16* 18* 19*  GLUCOSE 91 85 97 93 96  BUN 66* 78* 76* 78* 82*  CREATININE 6.15* 6.91* 7.07*  7.45* 7.66*  CALCIUM 9.7 9.7 9.5 9.5 9.4  MG  --   --  1.8 2.0  --   PHOS  --   --  6.3* 6.9* 6.6*   Liver Function Tests: Recent Labs  Lab 08/25/18 1533 08/25/18 1746 08/26/18 0904 08/27/18 0543 08/28/18 0633  AST 9 14* 13* 13*  --   ALT 10 12 10 11   --   ALKPHOS 87 61 65 60  --   BILITOT 0.3 0.7 0.8 0.9  --   PROT 6.8 6.6 6.5 6.3*  --   ALBUMIN 4.3 3.6 3.4* 3.3* 3.4*   No results for input(s): LIPASE, AMYLASE in the last 168 hours. No results for input(s): AMMONIA in the last 168 hours. CBC: Recent Labs  Lab 08/25/18 1746 08/26/18 0904 08/27/18 0543  WBC 9.8 8.3 8.1  NEUTROABS 6.4 5.3 4.6  HGB 9.0* 8.6* 8.6*  HCT 30.0* 27.0* 27.7*  MCV 87.2 84.9 84.7  PLT 259 260 240   Cardiac Enzymes: Recent Labs  Lab 08/25/18 1746 08/25/18 2339 08/26/18 0317 08/26/18 0904  TROPONINI 0.03* 0.03* 0.04* 0.04*   BNP: BNP (last 3 results) Recent Labs    08/25/18 1746  BNP 513.0*    ProBNP (last 3 results) No results for input(s): PROBNP in the last 8760 hours.  CBG: No results for input(s): GLUCAP in the last 168 hours.     Signed:  Cristal Ford  [facility] 08/28/2018, 10:14 AM

## 2018-08-28 NOTE — Discharge Instructions (Signed)
Chronic Kidney Disease, Adult Chronic kidney disease (CKD) happens when the kidneys are damaged over a long period of time. The kidneys are two organs that help with:  Getting rid of waste and extra fluid from the blood.  Making hormones that maintain the amount of fluid in your tissues and blood vessels.  Making sure that the body has the right amount of fluids and chemicals. Most of the time, CKD does not go away, but it can usually be controlled. Steps must be taken to slow down the kidney damage or to stop it from getting worse. If this is not done, the kidneys may stop working. Follow these instructions at home: Medicines  Take over-the-counter and prescription medicines only as told by your doctor. You may need to change the amount of medicines you take.  Do not take any new medicines unless your doctor says it is okay. Many medicines can make your kidney damage worse.  Do not take any vitamin and supplements unless your doctor says it is okay. Many vitamins and supplements can make your kidney damage worse. General instructions  Follow a diet as told by your doctor. You may need to stay away from: ? Alcohol. ? Salty foods. ? Foods that are high in:  Potassium.  Calcium.  Protein.  Do not use any products that contain nicotine or tobacco, such as cigarettes and e-cigarettes. If you need help quitting, ask your doctor.  Keep track of your blood pressure at home. Tell your doctor about any changes.  If you have diabetes, keep track of your blood sugar as told by your doctor.  Try to stay at a healthy weight. If you need help, ask your doctor.  Exercise at least 30 minutes a day, 5 days a week.  Stay up-to-date with your shots (immunizations) as told by your doctor.  Keep all follow-up visits as told by your doctor. This is important. Contact a doctor if:  Your symptoms get worse.  You have new symptoms. Get help right away if:  You have symptoms of end-stage  kidney disease. These may include: ? Headaches. ? Numbness in your hands or feet. ? Easy bruising. ? Having hiccups often. ? Chest pain. ? Shortness of breath. ? Stopping of menstrual periods in women.  You have a fever.  You have very little pee (urine).  You have pain or bleeding when you pee. Summary  Chronic kidney disease (CKD) happens when the kidneys are damaged over a long period of time.  Most of the time, this condition does not go away, but it can usually be controlled. Steps must be taken to slow down the kidney damage or to stop it from getting worse.  Treatment may include a combination of medicines and lifestyle changes. This information is not intended to replace advice given to you by your health care provider. Make sure you discuss any questions you have with your health care provider. Document Released: 10/03/2009 Document Revised: 08/13/2016 Document Reviewed: 08/13/2016 Elsevier Interactive Patient Education  2019 Elsevier Inc.  

## 2018-08-28 NOTE — Progress Notes (Signed)
  [facility] Progress Note    Assessment/ Plan:   1.  Pulmonary edema: Improved on p.o. Lasix.  Patient to be discharged today on increased dose of Lasix 80mg  and will be closely monitor ensure that he remains stable on this.  2.  AKI on CKD stage V: Patient remains resistant to have access placed despite bump in his creatinine today.  Will be discharged with close follow-up with [name].  3.  Anemia of CKD: Monitor, s/p feraheme  Dispo: Discharged today with follow-up with nephrology outpatient Subjective:   Patient states that he is ready to go home.   Objective:   BP 125/70 (BP Location: Right Arm)   Pulse 84   Temp 98.4 F (36.9 C) (Oral)   Resp 20   Ht 5\' 11"  (1.803 m)   Wt (!) 151.3 kg   SpO2 97%   BMI 46.52 kg/m   Intake/Output Summary (Last 24 hours) at 08/28/2018 1159 Last data filed at 08/28/2018 0847 Gross per 24 hour  Intake 720 ml  Output 1750 ml  Net -1030 ml   Weight change: 0 kg  Physical Exam: General: NAD, pleasant ENTM: Moist mucous membranes Neck: Supple, no LAD Cardiovascular: no LE edema Respiratory: normal work of breathing Derm: no rashes appreciated Neuro: CN II-XII grossly intact Psych: AOx3, appropriate affect  Imaging: No results found.  Labs: BMET Recent Labs  Lab 08/25/18 1533 08/25/18 1746 08/26/18 0904 08/27/18 0543 08/28/18 0633  NA 143 144 143 144 142  K 4.9 5.0 4.7 4.4 4.2  CL 112* 117* 112* 111 111  CO2 13* 12* 16* 18* 19*  GLUCOSE 91 85 97 93 96  BUN 66* 78* 76* 78* 82*  CREATININE 6.15* 6.91* 7.07* 7.45* 7.66*  CALCIUM 9.7 9.7 9.5 9.5 9.4  PHOS  --   --  6.3* 6.9* 6.6*   CBC Recent Labs  Lab 08/25/18 1746 08/26/18 0904 08/27/18 0543  WBC 9.8 8.3 8.1  NEUTROABS 6.4 5.3 4.6  HGB 9.0* 8.6* 8.6*  HCT 30.0* 27.0* 27.7*  MCV 87.2 84.9 84.7  PLT 259 260 240    Medications:    . amLODipine  10 mg Oral Daily  . aspirin EC  81 mg Oral Daily  . calcitRIOL  0.25 mcg Oral Daily  .  calcium-vitamin D  1 tablet Oral BID  . carvedilol  25 mg Oral QHS  . febuxostat  40 mg Oral QODAY  . furosemide  80 mg Oral Daily  . heparin  5,000 Units Subcutaneous Q8H  . loratadine  10 mg Oral Daily  . multivitamin with minerals  1 tablet Oral Daily  . rosuvastatin  5 mg Oral q1800  . sodium bicarbonate  1,300 mg Oral BID    [place] Ova Gillentine, DO PGY-2, [facility]  08/28/2018, 11:59 AM

## 2018-08-28 NOTE — Progress Notes (Signed)
Pt discharge instructions given, pt verbalized understanding.  VSS.  Denies pain.  Pt left floor via wheelchair by hospitality.

## 2018-09-16 ENCOUNTER — Encounter: Payer: Self-pay | Admitting: Family Medicine

## 2018-09-16 ENCOUNTER — Ambulatory Visit (INDEPENDENT_AMBULATORY_CARE_PROVIDER_SITE_OTHER): Payer: Medicare Other | Admitting: Family Medicine

## 2018-09-16 ENCOUNTER — Other Ambulatory Visit: Payer: Self-pay

## 2018-09-16 VITALS — BP 151/82 | HR 89 | Temp 98.2°F | Resp 17 | Ht 71.0 in | Wt 330.0 lb

## 2018-09-16 DIAGNOSIS — J3489 Other specified disorders of nose and nasal sinuses: Secondary | ICD-10-CM

## 2018-09-16 MED ORDER — FLUTICASONE PROPIONATE 50 MCG/ACT NA SUSP: 2.0000 | Freq: Every day | NASAL | 6 refills | Status: DC

## 2018-09-16 NOTE — Patient Instructions (Signed)
° ° ° °  If you have lab work done today you will be contacted with your lab results within the next 2 weeks.  If you have not heard from us then please contact us. The fastest way to get your results is to register for My Chart. ° ° °IF you received an x-ray today, you will receive an invoice from [facility]. Please contact [facility] at [phone] with questions or concerns regarding your invoice.  ° °IF you received labwork today, you will receive an invoice from [facility]. Please contact [facility] at [phone] with questions or concerns regarding your invoice.  ° °Our billing staff will not be able to assist you with questions regarding bills from these companies. ° °You will be contacted with the lab results as soon as they are available. The fastest way to get your results is to activate your My Chart account. Instructions are located on the last page of this paperwork. If you have not heard from us regarding the results in 2 weeks, please contact this office. °  ° ° ° °

## 2018-09-16 NOTE — Progress Notes (Signed)
Left w/o being seen

## 2018-09-17 ENCOUNTER — Ambulatory Visit (INDEPENDENT_AMBULATORY_CARE_PROVIDER_SITE_OTHER): Payer: Medicare Other | Admitting: Family Medicine

## 2018-09-17 ENCOUNTER — Encounter: Payer: Self-pay | Admitting: Family Medicine

## 2018-09-17 ENCOUNTER — Other Ambulatory Visit: Payer: Self-pay

## 2018-09-17 VITALS — BP 117/67 | HR 81 | Temp 98.4°F | Resp 18 | Ht 71.0 in | Wt 331.2 lb

## 2018-09-17 DIAGNOSIS — I10 Essential (primary) hypertension: Secondary | ICD-10-CM

## 2018-09-17 DIAGNOSIS — N185 Chronic kidney disease, stage 5: Secondary | ICD-10-CM | POA: Diagnosis not present

## 2018-09-17 DIAGNOSIS — D631 Anemia in chronic kidney disease: Secondary | ICD-10-CM

## 2018-09-17 NOTE — Progress Notes (Signed)
2/26/202010:43 AM  [facility] 10/17/1950, 68 y.o. male [id number]  Chief Complaint  Patient presents with  . Hospitalization Follow-up    fluid     HPI:   Patient is a 68 y.o. male with past medical history significant for HTN, HLP, gout, CKD stage 5  who presents today for hospital followup  Admitted from 08/25/2018 - 08/28/2018 For acute pulmonary edema 2/2/ acute on chronic CKD 5 Declined dialysis Lasix increased from 40mg  to 80mg  Placed on 1239ml fluid restriction  Patient reports he is doing well, back to baseline Denies any SOB, orthopnea Reports edema much improved Taking lasix 80mg  once a day Has sign decreased water intake Has seen renal since discharge and sees again in 2 months No pruritus, no changes in mentation Still urinates  Started having some sinus issues last couple of days rhinorrhea and congestion No fever or chills Zyrtec not helping flonase sent yesterday will pickup today   Fall Risk  09/17/2018 09/16/2018 09/16/2018 08/25/2018 02/11/2018  Falls in the past year? 0 0 0 0 No  Number falls in past yr: - 0 0 0 -  Injury with Fall? - 0 0 0 -  Comment - - - - -  Risk for fall due to : - - - Other (Comment) -  Follow up Falls evaluation completed - - Falls evaluation completed -     Depression screen [facility] 2/9 09/17/2018 09/16/2018 08/25/2018  Decreased Interest 0 0 0  Down, Depressed, Hopeless 0 0 0  PHQ - 2 Score 0 0 0    Allergies  Allergen Reactions  . Codeine Phosphate     Prior to Admission medications   Medication Sig Start Date End Date Taking? Authorizing Provider  acetaminophen (TYLENOL) 500 MG tablet Take 500 mg by mouth See admin instructions. Takes 1000mg  in the morning and 1000mg  in the evening.   Yes [provider]  amLODipine (NORVASC) 10 MG tablet Take 1 tablet (10 mg total) by mouth daily. 02/11/18  Yes Weber, Damaris Hippo, PA-C  aspirin 81 MG tablet Take 81 mg by mouth daily.     Yes [provider]  calcium-vitamin  D 250-100 MG-UNIT tablet Take 1 tablet by mouth 2 (two) times daily.   Yes [provider]  carvedilol (COREG) 25 MG tablet Take 25 mg by mouth daily. 11/22/17  Yes [provider]  cetirizine (ZYRTEC) 10 MG tablet Take 10 mg by mouth daily as needed for allergies.    Yes [provider]  diphenhydramine-acetaminophen (TYLENOL PM) 25-500 MG TABS tablet Take 1 tablet by mouth at bedtime as needed (sleep).   Yes [provider]  fluticasone (FLONASE) 50 MCG/ACT nasal spray Place 2 sprays into both nostrils daily. 09/16/18  Yes [name], MD  furosemide (LASIX) 80 MG tablet Take 1 tablet (80 mg total) by mouth daily. 08/29/18  Yes Mikhail, [place], DO  Multiple Vitamins-Minerals (ONE-A-DAY MENS 50+ ADVANTAGE PO) Take 1 tablet by mouth daily.   Yes [provider]  rosuvastatin (CRESTOR) 5 MG tablet Take 1 tablet (5 mg total) by mouth daily. 08/09/17  Yes Weber, Sarah L, PA-C  sodium bicarbonate 650 MG tablet Take 1 tablet (650 mg total) by mouth 2 (two) times daily. 02/11/18  Yes Weber, Sarah L, PA-C  traMADol (ULTRAM) 50 MG tablet Take 1 tablet (50 mg total) by mouth every 12 (twelve) hours as needed. 08/25/18  Yes [name], MD  ULORIC 40 MG tablet Take 40 mg by  mouth every other day.  11/06/16  Yes [provider]    Past Medical History:  Diagnosis Date  . Cervical disc disease   . Chronic kidney disease   . COLONIC POLYPS, HX OF 06/27/2007  . EXOGENOUS OBESITY 01/30/2010  . GOUT 01/30/2010  . HYPERCHOLESTEROLEMIA 06/30/2007  . HYPERTENSION 06/27/2007  . LOW BACK PAIN 06/27/2007  . NEPHROLITHIASIS, HX OF 06/27/2007  . OSTEOARTHRITIS 06/27/2007  . SLEEP APNEA, OBSTRUCTIVE, MODERATE 01/27/2009    Past Surgical History:  Procedure Laterality Date  . CARDIAC CATHETERIZATION    . JOINT REPLACEMENT    . LUMBAR LAMINECTOMY    . RADIOFREQUENCY ABLATION KIDNEY    . TOTAL KNEE ARTHROPLASTY     left    Social History   Tobacco Use  .  Smoking status: Former Smoker    Last attempt to quit: 07/23/1980    Years since quitting: 38.1  . Smokeless tobacco: Never Used  Substance Use Topics  . Alcohol use: Yes    Family History  Problem Relation Age of Onset  . Hypertension Other   . Sleep apnea Other   . Diabetes Sister   . Hyperlipidemia Sister     ROS Per hpi  OBJECTIVE:  Blood pressure 117/67, pulse 81, temperature 98.4 F (36.9 C), temperature source Oral, resp. rate 18, height 5\' 11"  (1.803 m), weight (!) 331 lb 3.2 oz (150.2 kg), SpO2 99 %. Body mass index is 46.19 kg/m.   Last OV weight on 08/25/2018 - 342 lbs  BP Readings from Last 3 Encounters:  09/17/18 117/67  09/16/18 (!) 151/82  08/28/18 125/70    Physical Exam Vitals signs and nursing note reviewed.  Constitutional:      Appearance: He is well-developed.  HENT:     Head: Normocephalic and atraumatic.  Eyes:     Conjunctiva/sclera: Conjunctivae normal.     Pupils: Pupils are equal, round, and reactive to light.  Neck:     Musculoskeletal: Neck supple.  Cardiovascular:     Rate and Rhythm: Normal rate and regular rhythm.     Heart sounds: No murmur. No friction rub. No gallop.   Pulmonary:     Effort: Pulmonary effort is normal.     Breath sounds: Normal breath sounds. No wheezing or rales.  Musculoskeletal:     Right lower leg: Edema (piting trace distal shin) present.     Left lower leg: Edema (piting trace distal shin) present.  Skin:    General: Skin is warm and dry.  Neurological:     Mental Status: He is alert and oriented to person, place, and time.      ASSESSMENT and PLAN  1. CKD (chronic kidney disease) stage 5, GFR less than 15 ml/min (HCC) 2. Essential hypertension 3. Anemia in stage 5 CKD, not on chronic dialysis - CBC - Comprehensive metabolic panel  Volume status much improved on higher lasix and fluid restriction. Denies any sx of concer. Continues to remain ambivalent re HD. Followed closely by renal.    No  follow-ups on file.    [name], MD [facility] at [facility] [place], [place] [address] Ph.  [phone] Fax [phone]

## 2018-09-17 NOTE — Patient Instructions (Signed)
° ° ° °  If you have lab work done today you will be contacted with your lab results within the next 2 weeks.  If you have not heard from us then please contact us. The fastest way to get your results is to register for My Chart. ° ° °IF you received an x-ray today, you will receive an invoice from [facility]. Please contact [facility] at [phone] with questions or concerns regarding your invoice.  ° °IF you received labwork today, you will receive an invoice from [facility]. Please contact [facility] at [phone] with questions or concerns regarding your invoice.  ° °Our billing staff will not be able to assist you with questions regarding bills from these companies. ° °You will be contacted with the lab results as soon as they are available. The fastest way to get your results is to activate your My Chart account. Instructions are located on the last page of this paperwork. If you have not heard from us regarding the results in 2 weeks, please contact this office. °  ° ° ° °

## 2018-09-18 ENCOUNTER — Inpatient Hospital Stay (HOSPITAL_COMMUNITY)
Admission: RE | Admit: 2018-09-18 | Discharge: 2018-09-18 | Disposition: A | Discharge status: Home / Self Care | Payer: Medicare Other | Source: Ambulatory Visit | Attending: Nephrology | Admitting: Nephrology

## 2018-09-18 LAB — COMPREHENSIVE METABOLIC PANEL
ALT: 9 IU/L (ref 0–44)
AST: 8 IU/L (ref 0–40)
Albumin/Globulin Ratio: 1.8 (ref 1.2–2.2)
Albumin: 4.4 g/dL (ref 3.8–4.8)
Alkaline Phosphatase: 79 IU/L (ref 39–117)
BUN/Creatinine Ratio: 12 (ref 10–24)
BUN: 88 mg/dL (ref 8–27)
Bilirubin Total: 0.3 mg/dL (ref 0.0–1.2)
CO2: 14 mmol/L — CL (ref 20–29)
Calcium: 10.3 mg/dL — ABNORMAL HIGH (ref 8.6–10.2)
Chloride: 109 mmol/L — ABNORMAL HIGH (ref 96–106)
Creatinine, Ser: 7.51 mg/dL (ref 0.76–1.27)
GFR calc Af Amer: 8 mL/min/{1.73_m2} — ABNORMAL LOW (ref >59)
GFR calc non Af Amer: 7 mL/min/{1.73_m2} — ABNORMAL LOW (ref >59)
Globulin, Total: 2.4 g/dL (ref 1.5–4.5)
Glucose: 87 mg/dL (ref 65–99)
Potassium: 5.2 mmol/L (ref 3.5–5.2)
Sodium: 146 mmol/L — ABNORMAL HIGH (ref 134–144)
Total Protein: 6.8 g/dL (ref 6.0–8.5)

## 2018-09-18 LAB — CBC
Hematocrit: 30.5 % — ABNORMAL LOW (ref 37.5–51.0)
Hemoglobin: 9.9 g/dL — ABNORMAL LOW (ref 13.0–17.7)
MCH: 27 pg (ref 26.6–33.0)
MCHC: 32.5 g/dL (ref 31.5–35.7)
MCV: 83 fL (ref 79–97)
Platelets: 270 10*3/uL (ref 150–450)
RBC: 3.67 x10E6/uL — ABNORMAL LOW (ref 4.14–5.80)
RDW: 14.9 % (ref 11.6–15.4)
WBC: 9.6 10*3/uL (ref 3.4–10.8)

## 2018-09-18 NOTE — Progress Notes (Signed)
Stable. Known ckd 5. Followed by renal

## 2018-10-02 ENCOUNTER — Encounter (HOSPITAL_COMMUNITY): Payer: Medicare Other

## 2018-10-22 ENCOUNTER — Other Ambulatory Visit: Payer: Self-pay

## 2018-10-22 ENCOUNTER — Telehealth (INDEPENDENT_AMBULATORY_CARE_PROVIDER_SITE_OTHER): Payer: Medicare Other | Admitting: Family Medicine

## 2018-10-22 DIAGNOSIS — L03116 Cellulitis of left lower limb: Secondary | ICD-10-CM | POA: Diagnosis not present

## 2018-10-22 DIAGNOSIS — N185 Chronic kidney disease, stage 5: Secondary | ICD-10-CM

## 2018-10-22 DIAGNOSIS — R609 Edema, unspecified: Secondary | ICD-10-CM

## 2018-10-22 MED ORDER — CEPHALEXIN 500 MG PO CAPS: 500.0000 mg | ORAL_CAPSULE | Freq: Two times a day (BID) | ORAL | 0 refills | Status: DC

## 2018-10-22 NOTE — Patient Instructions (Signed)
° ° ° °  If you have lab work done today you will be contacted with your lab results within the next 2 weeks.  If you have not heard from us then please contact us. The fastest way to get your results is to register for My Chart. ° ° °IF you received an x-ray today, you will receive an invoice from [facility]. Please contact [facility] at [phone] with questions or concerns regarding your invoice.  ° °IF you received labwork today, you will receive an invoice from [facility]. Please contact [facility] at [phone] with questions or concerns regarding your invoice.  ° °Our billing staff will not be able to assist you with questions regarding bills from these companies. ° °You will be contacted with the lab results as soon as they are available. The fastest way to get your results is to activate your My Chart account. Instructions are located on the last page of this paperwork. If you have not heard from us regarding the results in 2 weeks, please contact this office. °  ° ° ° °

## 2018-10-22 NOTE — Progress Notes (Signed)
Virtual Visit via Telephone Note  I connected with Alex Williams on 10/22/18 at 9:55 AM by telephone and verified that I am speaking with the correct person using two identifiers.   I discussed the limitations, risks, security and privacy concerns of performing an evaluation and management service by telephone and the availability of in person appointments. I also discussed with the patient that there may be a patient responsible charge related to this service. The patient expressed understanding and agreed to proceed, consent obtained  Chief complaint: Ankle rash  History of Present Illness:  Alex Williams is a 68 y.o. male   Rash above left ankle. 1 inch above ankle. Warm to touch, 4 inches high, radiates around 1/3 of ankle. Started 5 days ago. No known injury or known preceeding wound. Itching initial day, but better now. No fever. Warm to area. No discharge. Redness has grown past few days.  Hx CKD, on lasix 80mg  qd, with prior pulmonary edema, followed by nephrology. Feels great, has held off on dialysis as doing well. No dyspnea. No leg swelling.  Lab Results  Component Value Date   CREATININE 7.51 ([place]) 09/17/2018      Patient Active Problem List   Diagnosis Date Noted  . Acute on chronic renal failure ([place]) 08/26/2018  . Nonspecific abnormal electrocardiogram (ECG) (EKG) 08/25/2018  . Anemia in CKD (chronic kidney disease) 08/25/2018  . Elevated troponin 08/25/2018  . SOB (shortness of breath) 08/25/2018  . Acute pulmonary edema (HCC)   . Acute renal failure superimposed on stage 5 chronic kidney disease, not on chronic dialysis ([place]) 02/13/2018  . Morbid obesity with BMI of 45.0-49.9, adult ([place]) 12/03/2013  . Renal mass 12/02/2013  . Lower extremity edema 03/16/2013  . CKD (chronic kidney disease), stage III ([place]) 12/10/2011  . GOUT 01/30/2010  . Obstructive sleep apnea 01/27/2009  . HYPERCHOLESTEROLEMIA 06/30/2007  . Essential hypertension 06/27/2007  .  Osteoarthritis 06/27/2007  . LOW BACK PAIN 06/27/2007  . History of colonic polyps 06/27/2007  . NEPHROLITHIASIS, HX OF 06/27/2007   Past Medical History:  Diagnosis Date  . Cervical disc disease   . Chronic kidney disease   . COLONIC POLYPS, HX OF 06/27/2007  . EXOGENOUS OBESITY 01/30/2010  . GOUT 01/30/2010  . HYPERCHOLESTEROLEMIA 06/30/2007  . HYPERTENSION 06/27/2007  . LOW BACK PAIN 06/27/2007  . NEPHROLITHIASIS, HX OF 06/27/2007  . OSTEOARTHRITIS 06/27/2007  . SLEEP APNEA, OBSTRUCTIVE, MODERATE 01/27/2009   Past Surgical History:  Procedure Laterality Date  . CARDIAC CATHETERIZATION    . JOINT REPLACEMENT    . LUMBAR LAMINECTOMY    . RADIOFREQUENCY ABLATION KIDNEY    . TOTAL KNEE ARTHROPLASTY     left   Allergies  Allergen Reactions  . Codeine Phosphate    Prior to Admission medications   Medication Sig Start Date End Date Taking? Authorizing Provider  acetaminophen (TYLENOL) 500 MG tablet Take 500 mg by mouth See admin instructions. Takes 1000mg  in the morning and 1000mg  in the evening.   Yes [provider]  amLODipine (NORVASC) 10 MG tablet Take 1 tablet (10 mg total) by mouth daily. 02/11/18  Yes Weber, Sarah L, PA-C  calcium-vitamin D 250-100 MG-UNIT tablet Take 1 tablet by mouth 2 (two) times daily.   Yes [provider]  carvedilol (COREG) 25 MG tablet Take 25 mg by mouth daily. 11/22/17  Yes [provider]  cetirizine (ZYRTEC) 10 MG tablet Take 10 mg by mouth daily as needed for allergies.  Yes [provider]  diphenhydramine-acetaminophen (TYLENOL PM) 25-500 MG TABS tablet Take 1 tablet by mouth at bedtime as needed (sleep).   Yes [provider]  fluticasone (FLONASE) 50 MCG/ACT nasal spray Place 2 sprays into both nostrils daily. 09/16/18  Yes [name], MD  furosemide (LASIX) 80 MG tablet Take 1 tablet (80 mg total) by mouth daily. 08/29/18  Yes Mikhail, [place], DO  Multiple Vitamins-Minerals (ONE-A-DAY MENS 50+  ADVANTAGE PO) Take 1 tablet by mouth daily.   Yes [provider]  rosuvastatin (CRESTOR) 5 MG tablet Take 1 tablet (5 mg total) by mouth daily. 08/09/17  Yes Weber, Sarah L, PA-C  sodium bicarbonate 650 MG tablet Take 1 tablet (650 mg total) by mouth 2 (two) times daily. 02/11/18  Yes Weber, Sarah L, PA-C  traMADol (ULTRAM) 50 MG tablet Take 1 tablet (50 mg total) by mouth every 12 (twelve) hours as needed. 08/25/18  Yes [name], MD  ULORIC 40 MG tablet Take 40 mg by mouth every other day.  11/06/16  Yes [provider]   Social History   Socioeconomic History  . Marital status: Married    Spouse name: Not on file  . Number of children: Not on file  . Years of education: Not on file  . Highest education level: Not on file  Occupational History  . Not on file  Social Needs  . Financial resource strain: Not hard at all  . Food insecurity:    Worry: Never true    Inability: Never true  . Transportation needs:    Medical: No    Non-medical: No  Tobacco Use  . Smoking status: Former Smoker    Last attempt to quit: 07/23/1980    Years since quitting: 38.2  . Smokeless tobacco: Never Used  Substance and Sexual Activity  . Alcohol use: Yes  . Drug use: No  . Sexual activity: Not on file  Lifestyle  . Physical activity:    Days per week: Not on file    Minutes per session: Not on file  . Stress: Not on file  Relationships  . Social connections:    Talks on phone: Not on file    Gets together: Not on file    Attends religious service: Not on file    Active member of club or organization: Not on file    Attends meetings of clubs or organizations: Not on file    Relationship status: Not on file  . Intimate partner violence:    Fear of current or ex partner: Not on file    Emotionally abused: Not on file    Physically abused: Not on file    Forced sexual activity: Not on file  Other Topics Concern  . Not on file  Social History Narrative  . Not on file      Observations/Objective: Face time call, erythema noted across anterior lower leg, approximately 4.5 inches based on home measurement with tape measure.  Radiates to the medial aspect of lower leg and slightly to the lateral aspect of lower leg.  Did not see any vesicles/pustules.  Skin intact did have patient press on lower legs bilaterally and does have some pitting edema.  Assessment and Plan: Cellulitis of leg, left - Plan: cephALEXin (KEFLEX) 500 MG capsule  Peripheral edema  CKD (chronic kidney disease) stage 5, GFR less than 15 ml/min (HCC) History of peripheral edema with chronic kidney disease.  Denies dyspnea denies worsening swelling.  Appears to  have acute cellulitis versus stasis dermatitis of left lower leg with some spreading erythema.  Start Keflex 500 mg twice daily, should be sufficient with renal dosing.  Recheck in 48 hours with tele-med visit, including face time likely again at that time with option of an office visit if any worsening sooner.  Did recommend leg elevation to decrease pedal edema, does not have compression stockings at home.  Follow Up Instructions:  2 days.    I discussed the assessment and treatment plan with the patient. The patient was provided an opportunity to ask questions and all were answered. The patient agreed with the plan and demonstrated an understanding of the instructions.   The patient was advised to call back or seek an in-person evaluation if the symptoms worsen or if the condition fails to improve as anticipated.  I provided 8 initial minutes, then facetime of 77min - total 17min non-face-to-face time during this encounter.  Signed,   Merri Ray, MD [facility].  10/22/18

## 2018-10-22 NOTE — Progress Notes (Signed)
CC- Rash- located on left leg 1inch above ankle, ankle tenderness, warm to the tough, swollen a little. started Sat 10/18/18.

## 2018-10-24 ENCOUNTER — Other Ambulatory Visit: Payer: Self-pay

## 2018-10-24 ENCOUNTER — Telehealth (INDEPENDENT_AMBULATORY_CARE_PROVIDER_SITE_OTHER): Payer: Medicare Other | Admitting: Family Medicine

## 2018-10-24 ENCOUNTER — Encounter: Payer: Self-pay | Admitting: Family Medicine

## 2018-10-24 DIAGNOSIS — L03116 Cellulitis of left lower limb: Secondary | ICD-10-CM

## 2018-10-24 DIAGNOSIS — G4700 Insomnia, unspecified: Secondary | ICD-10-CM

## 2018-10-24 NOTE — Progress Notes (Signed)
Virtual Visit via Video Note  I connected with Alex Williams on 10/24/18 at 10:27 AM by a video enabled telemedicine application (Face time - webex not working).  and verified that I am speaking with the correct person using two identifiers.   I discussed the limitations, risks, security and privacy concerns of performing an evaluation and management service by telephone and the availability of in person appointments. I also discussed with the patient that there may be a patient responsible charge related to this service. The patient expressed understanding and agreed to proceed, consent obtained  Chief complaint: Cellulitis   History of Present Illness: Alex Williams is a 68 y.o. male  Left leg cellulitis: Evaluated 2 days ago by telemedicine visit followed by FaceTime to evaluate exam further.  At that time noted some peripheral edema, chronic per patient but denied any shortness of breath/pulmonary symptoms.  Denied worsening edema.  Recommended leg elevation.  Did note some erythema on his left anterior lower leg with some surrounding erythema to lateral aspects of leg.  Measured approximately 4 inches high with radiation around one third of ankle.  Started on Keflex 500 mg twice daily.  History of chronic kidney disease.  Less redness. Taking antibiotic twice per day. No side effects. 4 doses so far.  Elevating legs.  no fever. Feels like improving.   Insomnia.  Some difficulty with sleeping recently.  Has tried Tylenol PM with minimal improvement.  Past Medical History:  Diagnosis Date  . Cervical disc disease   . Chronic kidney disease   . COLONIC POLYPS, HX OF 06/27/2007  . EXOGENOUS OBESITY 01/30/2010  . GOUT 01/30/2010  . HYPERCHOLESTEROLEMIA 06/30/2007  . HYPERTENSION 06/27/2007  . LOW BACK PAIN 06/27/2007  . NEPHROLITHIASIS, HX OF 06/27/2007  . OSTEOARTHRITIS 06/27/2007  . SLEEP APNEA, OBSTRUCTIVE, MODERATE 01/27/2009   Past Surgical History:  Procedure Laterality Date  .  CARDIAC CATHETERIZATION    . JOINT REPLACEMENT    . LUMBAR LAMINECTOMY    . RADIOFREQUENCY ABLATION KIDNEY    . TOTAL KNEE ARTHROPLASTY     left   Allergies  Allergen Reactions  . Codeine Phosphate    Prior to Admission medications   Medication Sig Start Date End Date Taking? Authorizing Provider  acetaminophen (TYLENOL) 500 MG tablet Take 500 mg by mouth See admin instructions. Takes 1000mg  in the morning and 1000mg  in the evening.   Yes [provider]  amLODipine (NORVASC) 10 MG tablet Take 1 tablet (10 mg total) by mouth daily. 02/11/18  Yes Weber, Sarah L, PA-C  calcium-vitamin D 250-100 MG-UNIT tablet Take 1 tablet by mouth 2 (two) times daily.   Yes [provider]  carvedilol (COREG) 25 MG tablet Take 25 mg by mouth daily. 11/22/17  Yes [provider]  cephALEXin (KEFLEX) 500 MG capsule Take 1 capsule (500 mg total) by mouth 2 (two) times daily. 10/22/18  Yes Wendie Agreste, MD  cetirizine (ZYRTEC) 10 MG tablet Take 10 mg by mouth daily as needed for allergies.    Yes [provider]  diphenhydramine-acetaminophen (TYLENOL PM) 25-500 MG TABS tablet Take 1 tablet by mouth at bedtime as needed (sleep).   Yes [provider]  fluticasone (FLONASE) 50 MCG/ACT nasal spray Place 2 sprays into both nostrils daily. 09/16/18  Yes [name], MD  furosemide (LASIX) 80 MG tablet Take 1 tablet (80 mg total) by mouth daily. 08/29/18  Yes Mikhail, [place], DO  Multiple Vitamins-Minerals (ONE-A-DAY MENS 50+ ADVANTAGE PO)  Take 1 tablet by mouth daily.   Yes [provider]  rosuvastatin (CRESTOR) 5 MG tablet Take 1 tablet (5 mg total) by mouth daily. 08/09/17  Yes Weber, Sarah L, PA-C  sodium bicarbonate 650 MG tablet Take 1 tablet (650 mg total) by mouth 2 (two) times daily. 02/11/18  Yes Weber, Sarah L, PA-C  traMADol (ULTRAM) 50 MG tablet Take 1 tablet (50 mg total) by mouth every 12 (twelve) hours as needed. 08/25/18  Yes [name], MD   ULORIC 40 MG tablet Take 40 mg by mouth every other day.  11/06/16  Yes [provider]   Social History   Socioeconomic History  . Marital status: Married    Spouse name: Not on file  . Number of children: Not on file  . Years of education: Not on file  . Highest education level: Not on file  Occupational History  . Not on file  Social Needs  . Financial resource strain: Not hard at all  . Food insecurity:    Worry: Never true    Inability: Never true  . Transportation needs:    Medical: No    Non-medical: No  Tobacco Use  . Smoking status: Former Smoker    Last attempt to quit: 07/23/1980    Years since quitting: 38.2  . Smokeless tobacco: Never Used  Substance and Sexual Activity  . Alcohol use: Yes  . Drug use: No  . Sexual activity: Not on file  Lifestyle  . Physical activity:    Days per week: Not on file    Minutes per session: Not on file  . Stress: Not on file  Relationships  . Social connections:    Talks on phone: Not on file    Gets together: Not on file    Attends religious service: Not on file    Active member of club or organization: Not on file    Attends meetings of clubs or organizations: Not on file    Relationship status: Not on file  . Intimate partner violence:    Fear of current or ex partner: Not on file    Emotionally abused: Not on file    Physically abused: Not on file    Forced sexual activity: Not on file  Other Topics Concern  . Not on file  Social History Narrative  . Not on file    Observations/Objective: Redness faded, receded about an inch on the bottom and sides. Moved about an inch proximal on upper leg. Less sore. 1+ edema on pressing on area.    Assessment and Plan: Cellulitis of leg, left  -Improving cellulitis based on video appearance.  No systemic symptoms.  Tolerating antibiotics.  Continue same, elevation to help with peripheral edema, outlined area of erythema at home and recheck with virtual visit in 2  days, RTC/ER precautions if worsening sooner.  Insomnia, unspecified type  -Melatonin or episodic Tylenol PM discussed, sleep hygiene discussed and relaxation regimen before bedtime.  Information provided by MyChart, can discuss at future visit if persistent.  Follow Up Instructions:  2 days. Sooner/ER/UC if worse    Patient Instructions  The cellulitis or skin infection does seem to be improving.  Continue antibiotics same dose for now, make sure to elevate legs to keep the swelling down.  Recheck visit on Monday as we did today to make sure it is continuing to improve, but let us know if there are any worsening symptoms sooner.  If any fevers or chills,  you may need to be seen prior to our visit on Monday.  That can be done through an urgent care or ER if absolutely necessary.  For sleep, okay to try Tylenol PM, melatonin over-the-counter can also be helpful.  Relaxation techniques such as warm bath or shower before bedtime, and avoiding watching TV or the news before going to sleep will also be helpful.  See other information below, but if the symptoms persist we can discuss it further on Monday.  Return to the clinic or go to the nearest emergency room if any of your symptoms worsen or new symptoms occur.  Insomnia Insomnia is a sleep disorder that makes it difficult to fall asleep or stay asleep. Insomnia can cause fatigue, low energy, difficulty concentrating, mood swings, and poor performance at work or school. There are three different ways to classify insomnia:  Difficulty falling asleep.  Difficulty staying asleep.  Waking up too early in the morning. Any type of insomnia can be long-term (chronic) or short-term (acute). Both are common. Short-term insomnia usually lasts for three months or less. Chronic insomnia occurs at least three times a week for longer than three months. What are the causes? Insomnia may be caused by another condition, situation, or substance, such as:   Anxiety.  Certain medicines.  Gastroesophageal reflux disease (GERD) or other gastrointestinal conditions.  Asthma or other breathing conditions.  Restless legs syndrome, sleep apnea, or other sleep disorders.  Chronic pain.  Menopause.  Stroke.  Abuse of alcohol, tobacco, or illegal drugs.  Mental health conditions, such as depression.  Caffeine.  Neurological disorders, such as Alzheimer's disease.  An overactive thyroid (hyperthyroidism). Sometimes, the cause of insomnia may not be known. What increases the risk? Risk factors for insomnia include:  Gender. Women are affected more often than men.  Age. Insomnia is more common as you get older.  Stress.  Lack of exercise.  Irregular work schedule or working night shifts.  Traveling between different time zones.  Certain medical and mental health conditions. What are the signs or symptoms? If you have insomnia, the main symptom is having trouble falling asleep or having trouble staying asleep. This may lead to other symptoms, such as:  Feeling fatigued or having low energy.  Feeling nervous about going to sleep.  Not feeling rested in the morning.  Having trouble concentrating.  Feeling irritable, anxious, or depressed. How is this diagnosed? This condition may be diagnosed based on:  Your symptoms and medical history. Your health care provider may ask about: ? Your sleep habits. ? Any medical conditions you have. ? Your mental health.  A physical exam. How is this treated? Treatment for insomnia depends on the cause. Treatment may focus on treating an underlying condition that is causing insomnia. Treatment may also include:  Medicines to help you sleep.  Counseling or therapy.  Lifestyle adjustments to help you sleep better. Follow these instructions at home: Eating and drinking   Limit or avoid alcohol, caffeinated beverages, and cigarettes, especially close to bedtime. These can disrupt  your sleep.  Do not eat a large meal or eat spicy foods right before bedtime. This can lead to digestive discomfort that can make it hard for you to sleep. Sleep habits   Keep a sleep diary to help you and your health care provider figure out what could be causing your insomnia. Write down: ? When you sleep. ? When you wake up during the night. ? How well you sleep. ? How rested you  feel the next day. ? Any side effects of medicines you are taking. ? What you eat and drink.  Make your bedroom a dark, comfortable place where it is easy to fall asleep. ? Put up shades or blackout curtains to block light from outside. ? Use a white noise machine to block noise. ? Keep the temperature cool.  Limit screen use before bedtime. This includes: ? Watching TV. ? Using your smartphone, tablet, or computer.  Stick to a routine that includes going to bed and waking up at the same times every day and night. This can help you fall asleep faster. Consider making a quiet activity, such as reading, part of your nighttime routine.  Try to avoid taking naps during the day so that you sleep better at night.  Get out of bed if you are still awake after 15 minutes of trying to sleep. Keep the lights down, but try reading or doing a quiet activity. When you feel sleepy, go back to bed. General instructions  Take over-the-counter and prescription medicines only as told by your health care provider.  Exercise regularly, as told by your health care provider. Avoid exercise starting several hours before bedtime.  Use relaxation techniques to manage stress. Ask your health care provider to suggest some techniques that may work well for you. These may include: ? Breathing exercises. ? Routines to release muscle tension. ? Visualizing peaceful scenes.  Make sure that you drive carefully. Avoid driving if you feel very sleepy.  Keep all follow-up visits as told by your health care provider. This is  important. Contact a health care provider if:  You are tired throughout the day.  You have trouble in your daily routine due to sleepiness.  You continue to have sleep problems, or your sleep problems get worse. Get help right away if:  You have serious thoughts about hurting yourself or someone else. If you ever feel like you may hurt yourself or others, or have thoughts about taking your own life, get help right away. You can go to your nearest emergency department or call:  Your local emergency services (911 in the [place]  A suicide crisis helpline, such as the [facility] at 405-171-5600. This is open 24 hours a day. Summary  Insomnia is a sleep disorder that makes it difficult to fall asleep or stay asleep.  Insomnia can be long-term (chronic) or short-term (acute).  Treatment for insomnia depends on the cause. Treatment may focus on treating an underlying condition that is causing insomnia.  Keep a sleep diary to help you and your health care provider figure out what could be causing your insomnia. This information is not intended to replace advice given to you by your health care provider. Make sure you discuss any questions you have with your health care provider. Document Released: 07/06/2000 Document Revised: 04/18/2017 Document Reviewed: 04/18/2017 Elsevier Interactive Patient Education  2019 [employer]  Cellulitis, Adult  Cellulitis is a skin infection. The infected area is usually warm, red, swollen, and tender. This condition occurs most often in the arms and lower legs. The infection can travel to the muscles, blood, and underlying tissue and become serious. It is very important to get treated for this condition. What are the causes? Cellulitis is caused by bacteria. The bacteria enter through a break in the skin, such as a cut, burn, insect bite, open sore, or crack. What increases the risk? This condition is more likely to occur in  people who:  Have a weak body defense system (immune system).  Have open wounds on the skin, such as cuts, burns, bites, and scrapes. Bacteria can enter the body through these open wounds.  Are older than 69 years of age.  Have diabetes.  Have a type of long-lasting (chronic) liver disease (cirrhosis) or kidney disease.  Are obese.  Have a skin condition such as: ? Itchy rash (eczema). ? Slow movement of blood in the veins (venous stasis). ? Fluid buildup below the skin (edema).  Have had radiation therapy.  Use IV drugs. What are the signs or symptoms? Symptoms of this condition include:  Redness, streaking, or spotting on the skin.  Swollen area of the skin.  Tenderness or pain when an area of the skin is touched.  Warm skin.  A fever.  Chills.  Blisters. How is this diagnosed? This condition is diagnosed based on a medical history and physical exam. You may also have tests, including:  Blood tests.  Imaging tests. How is this treated? Treatment for this condition may include:  Medicines, such as antibiotic medicines or medicines to treat allergies (antihistamines).  Supportive care, such as rest and application of cold or warm cloths (compresses) to the skin.  Hospital care, if the condition is severe. The infection usually starts to get better within 1-2 days of treatment. Follow these instructions at home:  Medicines  Take over-the-counter and prescription medicines only as told by your health care provider.  If you were prescribed an antibiotic medicine, take it as told by your health care provider. Do not stop taking the antibiotic even if you start to feel better. General instructions  Drink enough fluid to keep your urine pale yellow.  Do not touch or rub the infected area.  Raise (elevate) the infected area above the level of your heart while you are sitting or lying down.  Apply warm or cold compresses to the affected area as told by your  health care provider.  Keep all follow-up visits as told by your health care provider. This is important. These visits let your health care provider make sure a more serious infection is not developing. Contact a health care provider if:  You have a fever.  Your symptoms do not begin to improve within 1-2 days of starting treatment.  Your bone or joint underneath the infected area becomes painful after the skin has healed.  Your infection returns in the same area or another area.  You notice a swollen bump in the infected area.  You develop new symptoms.  You have a general ill feeling (malaise) with muscle aches and pains. Get help right away if:  Your symptoms get worse.  You feel very sleepy.  You develop vomiting or diarrhea that persists.  You notice red streaks coming from the infected area.  Your red area gets larger or turns dark in color. These symptoms may represent a serious problem that is an emergency. Do not wait to see if the symptoms will go away. Get medical help right away. Call your local emergency services (911 in the [place] Do not drive yourself to the hospital. Summary  Cellulitis is a skin infection. This condition occurs most often in the arms and lower legs.  Treatment for this condition may include medicines, such as antibiotic medicines or antihistamines.  Take over-the-counter and prescription medicines only as told by your health care provider. If you were prescribed an antibiotic medicine, do not stop taking the antibiotic even if you start to  feel better.  Contact a health care provider if your symptoms do not begin to improve within 1-2 days of starting treatment or your symptoms get worse.  Keep all follow-up visits as told by your health care provider. This is important. These visits let your health care provider make sure that a more serious infection is not developing. This information is not intended to replace advice given to you by your  health care provider. Make sure you discuss any questions you have with your health care provider. Document Released: 04/18/2005 Document Revised: 11/28/2017 Document Reviewed: 11/28/2017 Elsevier Interactive Patient Education  2019 [employer].    I discussed the assessment and treatment plan with the patient. The patient was provided an opportunity to ask questions and all were answered. The patient agreed with the plan and demonstrated an understanding of the instructions.   The patient was advised to call back or seek an in-person evaluation if the symptoms worsen or if the condition fails to improve as anticipated.  I provided  8 minutes of non-face-to-face time during this encounter.   Wendie Agreste, MD

## 2018-10-24 NOTE — Progress Notes (Signed)
Pt c/o follow up on cellulitis on left leg above ankle. No travel.

## 2018-10-24 NOTE — Patient Instructions (Signed)
The cellulitis or skin infection does seem to be improving.  Continue antibiotics same dose for now, make sure to elevate legs to keep the swelling down.  Recheck visit on Monday as we did today to make sure it is continuing to improve, but let us know if there are any worsening symptoms sooner.  If any fevers or chills, you may need to be seen prior to our visit on Monday.  That can be done through an urgent care or ER if absolutely necessary.  For sleep, okay to try Tylenol PM, melatonin over-the-counter can also be helpful.  Relaxation techniques such as warm bath or shower before bedtime, and avoiding watching TV or the news before going to sleep will also be helpful.  See other information below, but if the symptoms persist we can discuss it further on Monday.  Return to the clinic or go to the nearest emergency room if any of your symptoms worsen or new symptoms occur.  Insomnia Insomnia is a sleep disorder that makes it difficult to fall asleep or stay asleep. Insomnia can cause fatigue, low energy, difficulty concentrating, mood swings, and poor performance at work or school. There are three different ways to classify insomnia:  Difficulty falling asleep.  Difficulty staying asleep.  Waking up too early in the morning. Any type of insomnia can be long-term (chronic) or short-term (acute). Both are common. Short-term insomnia usually lasts for three months or less. Chronic insomnia occurs at least three times a week for longer than three months. What are the causes? Insomnia may be caused by another condition, situation, or substance, such as:  Anxiety.  Certain medicines.  Gastroesophageal reflux disease (GERD) or other gastrointestinal conditions.  Asthma or other breathing conditions.  Restless legs syndrome, sleep apnea, or other sleep disorders.  Chronic pain.  Menopause.  Stroke.  Abuse of alcohol, tobacco, or illegal drugs.  Mental health conditions, such as  depression.  Caffeine.  Neurological disorders, such as Alzheimer's disease.  An overactive thyroid (hyperthyroidism). Sometimes, the cause of insomnia may not be known. What increases the risk? Risk factors for insomnia include:  Gender. Women are affected more often than men.  Age. Insomnia is more common as you get older.  Stress.  Lack of exercise.  Irregular work schedule or working night shifts.  Traveling between different time zones.  Certain medical and mental health conditions. What are the signs or symptoms? If you have insomnia, the main symptom is having trouble falling asleep or having trouble staying asleep. This may lead to other symptoms, such as:  Feeling fatigued or having low energy.  Feeling nervous about going to sleep.  Not feeling rested in the morning.  Having trouble concentrating.  Feeling irritable, anxious, or depressed. How is this diagnosed? This condition may be diagnosed based on:  Your symptoms and medical history. Your health care provider may ask about: ? Your sleep habits. ? Any medical conditions you have. ? Your mental health.  A physical exam. How is this treated? Treatment for insomnia depends on the cause. Treatment may focus on treating an underlying condition that is causing insomnia. Treatment may also include:  Medicines to help you sleep.  Counseling or therapy.  Lifestyle adjustments to help you sleep better. Follow these instructions at home: Eating and drinking   Limit or avoid alcohol, caffeinated beverages, and cigarettes, especially close to bedtime. These can disrupt your sleep.  Do not eat a large meal or eat spicy foods right before bedtime. This can  lead to digestive discomfort that can make it hard for you to sleep. Sleep habits   Keep a sleep diary to help you and your health care provider figure out what could be causing your insomnia. Write down: ? When you sleep. ? When you wake up during  the night. ? How well you sleep. ? How rested you feel the next day. ? Any side effects of medicines you are taking. ? What you eat and drink.  Make your bedroom a dark, comfortable place where it is easy to fall asleep. ? Put up shades or blackout curtains to block light from outside. ? Use a white noise machine to block noise. ? Keep the temperature cool.  Limit screen use before bedtime. This includes: ? Watching TV. ? Using your smartphone, tablet, or computer.  Stick to a routine that includes going to bed and waking up at the same times every day and night. This can help you fall asleep faster. Consider making a quiet activity, such as reading, part of your nighttime routine.  Try to avoid taking naps during the day so that you sleep better at night.  Get out of bed if you are still awake after 15 minutes of trying to sleep. Keep the lights down, but try reading or doing a quiet activity. When you feel sleepy, go back to bed. General instructions  Take over-the-counter and prescription medicines only as told by your health care provider.  Exercise regularly, as told by your health care provider. Avoid exercise starting several hours before bedtime.  Use relaxation techniques to manage stress. Ask your health care provider to suggest some techniques that may work well for you. These may include: ? Breathing exercises. ? Routines to release muscle tension. ? Visualizing peaceful scenes.  Make sure that you drive carefully. Avoid driving if you feel very sleepy.  Keep all follow-up visits as told by your health care provider. This is important. Contact a health care provider if:  You are tired throughout the day.  You have trouble in your daily routine due to sleepiness.  You continue to have sleep problems, or your sleep problems get worse. Get help right away if:  You have serious thoughts about hurting yourself or someone else. If you ever feel like you may hurt  yourself or others, or have thoughts about taking your own life, get help right away. You can go to your nearest emergency department or call:  Your local emergency services (911 in the [place]  A suicide crisis helpline, such as the [facility] at (424)423-0568. This is open 24 hours a day. Summary  Insomnia is a sleep disorder that makes it difficult to fall asleep or stay asleep.  Insomnia can be long-term (chronic) or short-term (acute).  Treatment for insomnia depends on the cause. Treatment may focus on treating an underlying condition that is causing insomnia.  Keep a sleep diary to help you and your health care provider figure out what could be causing your insomnia. This information is not intended to replace advice given to you by your health care provider. Make sure you discuss any questions you have with your health care provider. Document Released: 07/06/2000 Document Revised: 04/18/2017 Document Reviewed: 04/18/2017 Elsevier Interactive Patient Education  2019 [employer]  Cellulitis, Adult  Cellulitis is a skin infection. The infected area is usually warm, red, swollen, and tender. This condition occurs most often in the arms and lower legs. The infection can travel to the muscles,  blood, and underlying tissue and become serious. It is very important to get treated for this condition. What are the causes? Cellulitis is caused by bacteria. The bacteria enter through a break in the skin, such as a cut, burn, insect bite, open sore, or crack. What increases the risk? This condition is more likely to occur in people who:  Have a weak body defense system (immune system).  Have open wounds on the skin, such as cuts, burns, bites, and scrapes. Bacteria can enter the body through these open wounds.  Are older than 69 years of age.  Have diabetes.  Have a type of long-lasting (chronic) liver disease (cirrhosis) or kidney disease.  Are  obese.  Have a skin condition such as: ? Itchy rash (eczema). ? Slow movement of blood in the veins (venous stasis). ? Fluid buildup below the skin (edema).  Have had radiation therapy.  Use IV drugs. What are the signs or symptoms? Symptoms of this condition include:  Redness, streaking, or spotting on the skin.  Swollen area of the skin.  Tenderness or pain when an area of the skin is touched.  Warm skin.  A fever.  Chills.  Blisters. How is this diagnosed? This condition is diagnosed based on a medical history and physical exam. You may also have tests, including:  Blood tests.  Imaging tests. How is this treated? Treatment for this condition may include:  Medicines, such as antibiotic medicines or medicines to treat allergies (antihistamines).  Supportive care, such as rest and application of cold or warm cloths (compresses) to the skin.  Hospital care, if the condition is severe. The infection usually starts to get better within 1-2 days of treatment. Follow these instructions at home:  Medicines  Take over-the-counter and prescription medicines only as told by your health care provider.  If you were prescribed an antibiotic medicine, take it as told by your health care provider. Do not stop taking the antibiotic even if you start to feel better. General instructions  Drink enough fluid to keep your urine pale yellow.  Do not touch or rub the infected area.  Raise (elevate) the infected area above the level of your heart while you are sitting or lying down.  Apply warm or cold compresses to the affected area as told by your health care provider.  Keep all follow-up visits as told by your health care provider. This is important. These visits let your health care provider make sure a more serious infection is not developing. Contact a health care provider if:  You have a fever.  Your symptoms do not begin to improve within 1-2 days of starting  treatment.  Your bone or joint underneath the infected area becomes painful after the skin has healed.  Your infection returns in the same area or another area.  You notice a swollen bump in the infected area.  You develop new symptoms.  You have a general ill feeling (malaise) with muscle aches and pains. Get help right away if:  Your symptoms get worse.  You feel very sleepy.  You develop vomiting or diarrhea that persists.  You notice red streaks coming from the infected area.  Your red area gets larger or turns dark in color. These symptoms may represent a serious problem that is an emergency. Do not wait to see if the symptoms will go away. Get medical help right away. Call your local emergency services (911 in the [place] Do not drive yourself to the hospital. Summary  Cellulitis is a skin infection. This condition occurs most often in the arms and lower legs.  Treatment for this condition may include medicines, such as antibiotic medicines or antihistamines.  Take over-the-counter and prescription medicines only as told by your health care provider. If you were prescribed an antibiotic medicine, do not stop taking the antibiotic even if you start to feel better.  Contact a health care provider if your symptoms do not begin to improve within 1-2 days of starting treatment or your symptoms get worse.  Keep all follow-up visits as told by your health care provider. This is important. These visits let your health care provider make sure that a more serious infection is not developing. This information is not intended to replace advice given to you by your health care provider. Make sure you discuss any questions you have with your health care provider. Document Released: 04/18/2005 Document Revised: 11/28/2017 Document Reviewed: 11/28/2017 Elsevier Interactive Patient Education  2019 [employer].

## 2018-10-28 ENCOUNTER — Telehealth: Payer: Medicare Other | Admitting: Emergency Medicine

## 2018-10-29 ENCOUNTER — Other Ambulatory Visit: Payer: Self-pay

## 2018-10-29 ENCOUNTER — Telehealth (INDEPENDENT_AMBULATORY_CARE_PROVIDER_SITE_OTHER): Payer: Medicare Other | Admitting: Family Medicine

## 2018-10-29 DIAGNOSIS — L03116 Cellulitis of left lower limb: Secondary | ICD-10-CM | POA: Diagnosis not present

## 2018-10-29 MED ORDER — DOXYCYCLINE HYCLATE 100 MG PO TABS: 100.0000 mg | ORAL_TABLET | Freq: Two times a day (BID) | ORAL | 0 refills | Status: DC

## 2018-10-29 NOTE — Progress Notes (Signed)
Virtual Visit via Video Note  I connected with Alex Williams on 10/29/18 at 12:00 PM by a video enabled telemedicine application (FAcetime) and verified that I am speaking with the correct person using two identifiers.   I discussed the limitations, risks, security and privacy concerns of performing an evaluation and management service by telephone and the availability of in person appointments. I also discussed with the patient that there may be a patient responsible charge related to this service. The patient expressed understanding and agreed to proceed, consent obtained  Chief complaint: Follow up cellulitis.  History of Present Illness: Alex Williams is a 68 y.o. male  Cellulitis: See prior visits.  Started on keflex 500mg  BID for left leg cellulitis on April 1 (renal dose with CKD stage 5) Improving at April 3 visit via telemedicine. Still on doxycycline twice per day.  Not moving down, but has moved up leg some in past few days.  No dyspnea, no other swelling  Elevating legs at night, some during the day. No compression stockings.  No diarrhea with current regimen.  No fevers, no calf pain.    Past Medical History:  Diagnosis Date  . Cervical disc disease   . Chronic kidney disease   . COLONIC POLYPS, HX OF 06/27/2007  . EXOGENOUS OBESITY 01/30/2010  . GOUT 01/30/2010  . HYPERCHOLESTEROLEMIA 06/30/2007  . HYPERTENSION 06/27/2007  . LOW BACK PAIN 06/27/2007  . NEPHROLITHIASIS, HX OF 06/27/2007  . OSTEOARTHRITIS 06/27/2007  . SLEEP APNEA, OBSTRUCTIVE, MODERATE 01/27/2009   Past Surgical History:  Procedure Laterality Date  . CARDIAC CATHETERIZATION    . JOINT REPLACEMENT    . LUMBAR LAMINECTOMY    . RADIOFREQUENCY ABLATION KIDNEY    . TOTAL KNEE ARTHROPLASTY     left   Allergies  Allergen Reactions  . Codeine Phosphate    Prior to Admission medications   Medication Sig Start Date End Date Taking? Authorizing Provider  acetaminophen (TYLENOL) 500 MG tablet Take 500 mg by  mouth See admin instructions. Takes 1000mg  in the morning and 1000mg  in the evening.   Yes [provider]  amLODipine (NORVASC) 10 MG tablet Take 1 tablet (10 mg total) by mouth daily. 02/11/18  Yes Weber, Sarah L, PA-C  calcium-vitamin D 250-100 MG-UNIT tablet Take 1 tablet by mouth 2 (two) times daily.   Yes [provider]  carvedilol (COREG) 25 MG tablet Take 25 mg by mouth daily. 11/22/17  Yes [provider]  cephALEXin (KEFLEX) 500 MG capsule Take 1 capsule (500 mg total) by mouth 2 (two) times daily. 10/22/18  Yes Wendie Agreste, MD  cetirizine (ZYRTEC) 10 MG tablet Take 10 mg by mouth daily as needed for allergies.    Yes [provider]  diphenhydramine-acetaminophen (TYLENOL PM) 25-500 MG TABS tablet Take 1 tablet by mouth at bedtime as needed (sleep).   Yes [provider]  fluticasone (FLONASE) 50 MCG/ACT nasal spray Place 2 sprays into both nostrils daily. 09/16/18  Yes [name], MD  furosemide (LASIX) 80 MG tablet Take 1 tablet (80 mg total) by mouth daily. 08/29/18  Yes Mikhail, Ione, DO  Multiple Vitamins-Minerals (ONE-A-DAY MENS 50+ ADVANTAGE PO) Take 1 tablet by mouth daily.   Yes [provider]  rosuvastatin (CRESTOR) 5 MG tablet Take 1 tablet (5 mg total) by mouth daily. 08/09/17  Yes Weber, Sarah L, PA-C  sodium bicarbonate 650 MG tablet Take 1 tablet (650 mg total) by mouth 2 (two) times daily. 02/11/18  Yes Weber, Sarah L, PA-C  traMADol (ULTRAM) 50 MG tablet Take 1 tablet (50 mg total) by mouth every 12 (twelve) hours as needed. 08/25/18  Yes [name], MD  ULORIC 40 MG tablet Take 40 mg by mouth every other day.  11/06/16  Yes [provider]   Social History   Socioeconomic History  . Marital status: Married    Spouse name: Not on file  . Number of children: Not on file  . Years of education: Not on file  . Highest education level: Not on file  Occupational History  . Not on file  Social Needs   . Financial resource strain: Not hard at all  . Food insecurity:    Worry: Never true    Inability: Never true  . Transportation needs:    Medical: No    Non-medical: No  Tobacco Use  . Smoking status: Former Smoker    Last attempt to quit: 07/23/1980    Years since quitting: 38.2  . Smokeless tobacco: Never Used  Substance and Sexual Activity  . Alcohol use: Yes  . Drug use: No  . Sexual activity: Not on file  Lifestyle  . Physical activity:    Days per week: Not on file    Minutes per session: Not on file  . Stress: Not on file  Relationships  . Social connections:    Talks on phone: Not on file    Gets together: Not on file    Attends religious service: Not on file    Active member of club or organization: Not on file    Attends meetings of clubs or organizations: Not on file    Relationship status: Not on file  . Intimate partner violence:    Fear of current or ex partner: Not on file    Emotionally abused: Not on file    Physically abused: Not on file    Forced sexual activity: Not on file  Other Topics Concern  . Not on file  Social History Narrative  . Not on file    Observations/Objective: Erythema anterior lower left leg  - measures 6.5 inches vertically. Calf nt. No open wounds/blisters. Small bump at top. 1+ edema.   Assessment and Plan: Cellulitis of leg, left - Plan: doxycycline (VIBRA-TABS) 100 MG tablet  -Initial improvement followed by some migration of erythema proximally.  Still no systemic symptoms.  I still suspect some component of stasis dermatitis or certainly stasis with peripheral edema that may be impacting healing of cellulitis  -Continue Keflex at same dose due to renal dosing, add doxycycline for now with potential side effects and risk of deep nuclear antibiotics discussed, understand expressed.  -Elevate legs whenever possible, but also recommended using compression stockings during his workday  -Recheck in next 48 hours if not improving  or any worsening, otherwise follow-up tele-med visit in 5 days.   Follow Up Instructions:   There are no Patient Instructions on file for this visit.  I discussed the assessment and treatment plan with the patient. The patient was provided an opportunity to ask questions and all were answered. The patient agreed with the plan and demonstrated an understanding of the instructions.   The patient was advised to call back or seek an in-person evaluation if the symptoms worsen or if the condition fails to improve as anticipated.  I provided 9 minutes of non-face-to-face time during this encounter.   Wendie Agreste, MD

## 2018-10-29 NOTE — Progress Notes (Signed)
Pt states he needs to follow-up will cellulitis of leg, pt states the cellulitis looks to be traveling up his leg at this time. Pain in left calf and lower back currently. Pt states tramadol is not helping with pain.

## 2018-11-03 ENCOUNTER — Other Ambulatory Visit: Payer: Self-pay

## 2018-11-03 ENCOUNTER — Telehealth (INDEPENDENT_AMBULATORY_CARE_PROVIDER_SITE_OTHER): Payer: Medicare Other | Admitting: Family Medicine

## 2018-11-03 DIAGNOSIS — L03116 Cellulitis of left lower limb: Secondary | ICD-10-CM

## 2018-11-03 NOTE — Progress Notes (Signed)
CC-  5day follow up-cellulitis left leg above ankle. The cellulitis  is much better at this time. Bp was 137/82 today at home.

## 2018-11-03 NOTE — Patient Instructions (Addendum)
Good talking to you today.  I am glad to hear the cellulitis is improving.  Recheck in 1 week but if any increased redness or worsening symptoms during that time, schedule visit sooner.  Take care.    If you have lab work done today you will be contacted with your lab results within the next 2 weeks.  If you have not heard from [place] then please contact us. The fastest way to get your results is to register for My Chart.   IF you received an x-ray today, you will receive an invoice from [facility]. Please contact [facility] at [phone] with questions or concerns regarding your invoice.   IF you received labwork today, you will receive an invoice from [facility]. Please contact [facility] at [phone] with questions or concerns regarding your invoice.   Our billing staff will not be able to assist you with questions regarding bills from these companies.  You will be contacted with the lab results as soon as they are available. The fastest way to get your results is to activate your My Chart account. Instructions are located on the last page of this paperwork. If you have not heard from [place] regarding the results in 2 weeks, please contact this office.

## 2018-11-03 NOTE — Progress Notes (Signed)
Virtual Visit via Video Note  I connected with Alex Williams on 11/03/18 at 12:05 PM by a video enabled telemedicine application and verified that I am speaking with the correct person using two identifiers.   I discussed the limitations, risks, security and privacy concerns of performing an evaluation and management service by telephone and the availability of in person appointments. I also discussed with the patient that there may be a patient responsible charge related to this service. The patient expressed understanding and agreed to proceed, consent obtained  Chief complaint:cellulitis follow up.    History of Present Illness: Alex Williams is a 68 y.o. male  Cellulitis: Initially diagnosed cellulitis on April 1 on left lower leg.  Initially treated with Keflex 500 mg twice daily for renal dosing with his chronic kidney disease, some increase in erythema noted at April 8 telemedicine visit with migration up the leg.  No fevers or systemic symptoms at that time.  Doxycycline 100 mg twice daily was added to Keflex.  Additionally recommended compressive stockings, more frequent leg elevation due to peripheral edema and likely stasis component.   Feels like new abx is working better. Not as sore, less redness. Off keflex since yesterday just on doxycycline. Elevating legs more. Has been wearing compression stocking.   See prior visit, blood pressure slightly elevated, but was 137/82 today at home.   Patient Active Problem List   Diagnosis Date Noted  . Acute on chronic renal failure ([employer]) 08/26/2018  . Nonspecific abnormal electrocardiogram (ECG) (EKG) 08/25/2018  . Anemia in CKD (chronic kidney disease) 08/25/2018  . Elevated troponin 08/25/2018  . SOB (shortness of breath) 08/25/2018  . Acute pulmonary edema (HCC)   . Acute renal failure superimposed on stage 5 chronic kidney disease, not on chronic dialysis ([place]) 02/13/2018  . Morbid obesity with BMI of 45.0-49.9, adult ([place])  12/03/2013  . Renal mass 12/02/2013  . Lower extremity edema 03/16/2013  . CKD (chronic kidney disease), stage III ([place]) 12/10/2011  . GOUT 01/30/2010  . Obstructive sleep apnea 01/27/2009  . HYPERCHOLESTEROLEMIA 06/30/2007  . Essential hypertension 06/27/2007  . Osteoarthritis 06/27/2007  . LOW BACK PAIN 06/27/2007  . History of colonic polyps 06/27/2007  . NEPHROLITHIASIS, HX OF 06/27/2007   Past Medical History:  Diagnosis Date  . Cervical disc disease   . Chronic kidney disease   . COLONIC POLYPS, HX OF 06/27/2007  . EXOGENOUS OBESITY 01/30/2010  . GOUT 01/30/2010  . HYPERCHOLESTEROLEMIA 06/30/2007  . HYPERTENSION 06/27/2007  . LOW BACK PAIN 06/27/2007  . NEPHROLITHIASIS, HX OF 06/27/2007  . OSTEOARTHRITIS 06/27/2007  . SLEEP APNEA, OBSTRUCTIVE, MODERATE 01/27/2009   Past Surgical History:  Procedure Laterality Date  . CARDIAC CATHETERIZATION    . JOINT REPLACEMENT    . LUMBAR LAMINECTOMY    . RADIOFREQUENCY ABLATION KIDNEY    . TOTAL KNEE ARTHROPLASTY     left   Allergies  Allergen Reactions  . Codeine Phosphate    Prior to Admission medications   Medication Sig Start Date End Date Taking? Authorizing Provider  acetaminophen (TYLENOL) 500 MG tablet Take 500 mg by mouth See admin instructions. Takes 1000mg  in the morning and 1000mg  in the evening.   Yes [provider]  amLODipine (NORVASC) 10 MG tablet Take 1 tablet (10 mg total) by mouth daily. 02/11/18  Yes Weber, Sarah L, PA-C  calcium-vitamin D 250-100 MG-UNIT tablet Take 1 tablet by mouth 2 (two) times daily.   Yes [provider]  carvedilol (COREG) 25  MG tablet Take 25 mg by mouth daily. 11/22/17  Yes [provider]  cetirizine (ZYRTEC) 10 MG tablet Take 10 mg by mouth daily as needed for allergies.    Yes [provider]  doxycycline (VIBRA-TABS) 100 MG tablet Take 1 tablet (100 mg total) by mouth 2 (two) times daily. 10/29/18  Yes Wendie Agreste, MD  furosemide (LASIX) 80 MG  tablet Take 1 tablet (80 mg total) by mouth daily. 08/29/18  Yes Mikhail, [place], DO  Multiple Vitamins-Minerals (ONE-A-DAY MENS 50+ ADVANTAGE PO) Take 1 tablet by mouth daily.   Yes [provider]  rosuvastatin (CRESTOR) 5 MG tablet Take 1 tablet (5 mg total) by mouth daily. 08/09/17  Yes Weber, Sarah L, PA-C  sodium bicarbonate 650 MG tablet Take 1 tablet (650 mg total) by mouth 2 (two) times daily. 02/11/18  Yes Weber, Sarah L, PA-C  traMADol (ULTRAM) 50 MG tablet Take 1 tablet (50 mg total) by mouth every 12 (twelve) hours as needed. 08/25/18  Yes [name], MD  ULORIC 40 MG tablet Take 40 mg by mouth every other day.  11/06/16  Yes [provider]  diphenhydramine-acetaminophen (TYLENOL PM) 25-500 MG TABS tablet Take 1 tablet by mouth at bedtime as needed (sleep).    [provider]  fluticasone (FLONASE) 50 MCG/ACT nasal spray Place 2 sprays into both nostrils daily. Patient not taking: Reported on 11/03/2018 09/16/18   [name], MD   Social History   Socioeconomic History  . Marital status: Married    Spouse name: Not on file  . Number of children: Not on file  . Years of education: Not on file  . Highest education level: Not on file  Occupational History  . Not on file  Social Needs  . Financial resource strain: Not hard at all  . Food insecurity:    Worry: Never true    Inability: Never true  . Transportation needs:    Medical: No    Non-medical: No  Tobacco Use  . Smoking status: Former Smoker    Last attempt to quit: 07/23/1980    Years since quitting: 38.3  . Smokeless tobacco: Never Used  Substance and Sexual Activity  . Alcohol use: Yes  . Drug use: No  . Sexual activity: Not on file  Lifestyle  . Physical activity:    Days per week: Not on file    Minutes per session: Not on file  . Stress: Not on file  Relationships  . Social connections:    Talks on phone: Not on file    Gets together: Not on file    Attends religious  service: Not on file    Active member of club or organization: Not on file    Attends meetings of clubs or organizations: Not on file    Relationship status: Not on file  . Intimate partner violence:    Fear of current or ex partner: Not on file    Emotionally abused: Not on file    Physically abused: Not on file    Forced sexual activity: Not on file  Other Topics Concern  . Not on file  Social History Narrative  . Not on file    Observations/Objective: Faint erythema of the anterior lower leg but no blistering or wounds identified, patient press on lower leg with minimal edema, approximately 1+.  Less tender to palpation.  Assessment and Plan: Cellulitis of leg, left Improving, with less erythema, less pain.  Tolerating doxycycline.  Continue same, also suspect some improvement with elevation and compressive stockings to decrease edema.  Continue compressive stockings and elevation as much as possible.  Recheck 1 week, sooner if any worsening symptoms  Follow Up Instructions: 1 week recheck, sooner if any increased redness, pain or worsening.   Patient Instructions   Good talking to you today.  I am glad to hear the cellulitis is improving.  Recheck in 1 week but if any increased redness or worsening symptoms during that time, schedule visit sooner.  Take care.    If you have lab work done today you will be contacted with your lab results within the next 2 weeks.  If you have not heard from [place] then please contact us. The fastest way to get your results is to register for My Chart.   IF you received an x-ray today, you will receive an invoice from [facility]. Please contact [facility] at [phone] with questions or concerns regarding your invoice.   IF you received labwork today, you will receive an invoice from [place]. Please contact [facility] at [phone] with questions or concerns regarding your invoice.   Our billing staff will not be able to  assist you with questions regarding bills from these companies.  You will be contacted with the lab results as soon as they are available. The fastest way to get your results is to activate your My Chart account. Instructions are located on the last page of this paperwork. If you have not heard from [place] regarding the results in 2 weeks, please contact this office.        I discussed the assessment and treatment plan with the patient. The patient was provided an opportunity to ask questions and all were answered. The patient agreed with the plan and demonstrated an understanding of the instructions.   The patient was advised to call back or seek an in-person evaluation if the symptoms worsen or if the condition fails to improve as anticipated.  I provided 6 minutes of non-face-to-face time during this encounter.   Wendie Agreste, MD

## 2018-11-06 ENCOUNTER — Ambulatory Visit: Payer: Medicare Other | Admitting: Registered Nurse

## 2018-11-07 ENCOUNTER — Encounter: Payer: Self-pay | Admitting: Registered Nurse

## 2018-11-07 ENCOUNTER — Ambulatory Visit (INDEPENDENT_AMBULATORY_CARE_PROVIDER_SITE_OTHER): Payer: Medicare Other | Admitting: Registered Nurse

## 2018-11-07 VITALS — BP 137/82 | Ht 72.0 in | Wt 331.0 lb

## 2018-11-07 DIAGNOSIS — Z0000 Encounter for general adult medical examination without abnormal findings: Secondary | ICD-10-CM

## 2018-11-07 NOTE — Patient Instructions (Signed)
  Mr. Krichbaum , Thank you for taking time to come for your Medicare Wellness Visit. I appreciate your ongoing commitment to your health goals. Please review the following plan we discussed and let me know if I can assist you in the future.   These are the goals we discussed: Goals    . Weight (lb) < 300 lb (136.1 kg)       This is a list of the screening recommended for you and due dates:  Health Maintenance  Topic Date Due  .  Hepatitis C: One time screening is recommended by Center for Disease Control  (CDC) for  adults born from [birth year] through 1966.   01/28/2019*  . Tetanus Vaccine  01/28/2019  . Flu Shot  02/21/2019  . Colon Cancer Screening  06/21/2021  . Pneumonia vaccines  Completed  *Topic was postponed. The date shown is not the original due date.

## 2018-11-07 NOTE — Progress Notes (Signed)
Alex Williams is a pleasant 68 y.o. male presenting today for Medicare Annual Wellness Visit. Alex Williams understands that this visit is taking place over telehealth, and takes place of an in-person visit. Alex Williams was identified using 2 identifiers: DOB and address.   Date of last exam: Is being followed by Dr Carlota Raspberry for LLE Cellulitis.    Last AWV: 08/09/2017 with Windell Hummingbird, PA-C.   Interpreter used for this visit? No  Patient Care Team: [name], MD as PCP - General (Family Medicine) Corliss Parish, MD as Consulting Physician (Nephrology)   Other items to address today: None   Other Screening: Last screening for diabetes:  Lab Results  Component Value Date   HGBA1C 5.4 08/26/2018    Last lipid screening: Lab Results  Component Value Date   CHOL 206 (H) 08/26/2018   HDL 24 (L) 08/26/2018   LDLCALC 142 (H) 08/26/2018   LDLDIRECT 155.0 01/20/2009   TRIG 199 (H) 08/26/2018   CHOLHDL 8.6 08/26/2018     ADVANCE DIRECTIVES: Discussed: Yes - patient has notarized advance directives On File: No - files requested Materials Provided: No - patient declined  Immunization status:  Immunization History  Administered Date(s) Administered  . Influenza Split 06/11/2011, 07/28/2012  . Influenza Whole 06/05/2010  . Influenza,inj,Quad PF,6+ Mos 07/29/2013, 08/02/2014, 04/30/2015  . Pneumococcal Conjugate-13 10/11/2015  . Pneumococcal Polysaccharide-23 08/09/2017  . Td 01/27/2009     There are no preventive care reminders to display for this patient.   Functional Status Survey: Is the patient deaf or have difficulty hearing?: No Does the patient have difficulty seeing, even when wearing glasses/contacts?: No Does the patient have difficulty concentrating, remembering, or making decisions?: No Does the patient have difficulty walking or climbing stairs?: No Does the patient have difficulty dressing or bathing?: No Does the patient have difficulty  doing errands alone such as visiting a doctor's office or shopping?: No Clinical Intake - 11/07/18 0804      Pre-visit preparation   Pre-visit preparation completed  No      Pain   Pain   0-10    Pain Score  3    flares to 7-8   Pain Type  Chronic pain    Pain Location  Back    Pain Orientation  Lower      Nutrition Screen   Nutritional Status  BMI > 30  Obese    Diabetes  No      Functional Status   Activities of Daily Living  Independent    Ambulation  Independent    Medication Administration  Independent    Home Management  Independent      Risk/Barriers   Barriers to Care Management & Learning  None      Abuse/Neglect   Do you feel unsafe in your current relationship?  No    Do you feel physically threatened by others?  No    Anyone hurting you at home, work, or school?  No    Unable to ask?  No      Patient Literacy   How often do you need to have someone help you when you read instructions, pamphlets, or other written materials from your doctor or pharmacy?  1 - Never      Investment banker, operational Needed?  No       6CIT Screen 11/07/2018  What Year? 0 points  What month? 0 points  What time? 0 points  Count  back from 20 0 points  Months in reverse 0 points  Repeat phrase 0 points  Total Score 0         Home Environment: Patient lives alone. He reports feeling safe and comfortable in his own home. He has a son that lives locally (within 3 miles) who he sees daily, and 2 sisters who live within 10 miles. He reports formerly having throw rugs and other trip hazards around his house, but he has since removed these. He reports no issues with ADLs and is completely independent.    Patient Active Problem List   Diagnosis Date Noted  . Acute on chronic renal failure ([place]) 08/26/2018  . Nonspecific abnormal electrocardiogram (ECG) (EKG) 08/25/2018  . Anemia in CKD (chronic kidney disease) 08/25/2018  . Elevated troponin 08/25/2018  . SOB (shortness  of breath) 08/25/2018  . Acute pulmonary edema (HCC)   . Acute renal failure superimposed on stage 5 chronic kidney disease, not on chronic dialysis ([place]) 02/13/2018  . Morbid obesity with BMI of 45.0-49.9, adult ([place]) 12/03/2013  . Renal mass 12/02/2013  . Lower extremity edema 03/16/2013  . CKD (chronic kidney disease), stage III ([place]) 12/10/2011  . GOUT 01/30/2010  . Obstructive sleep apnea 01/27/2009  . HYPERCHOLESTEROLEMIA 06/30/2007  . Essential hypertension 06/27/2007  . Osteoarthritis 06/27/2007  . LOW BACK PAIN 06/27/2007  . History of colonic polyps 06/27/2007  . NEPHROLITHIASIS, HX OF 06/27/2007     Past Medical History:  Diagnosis Date  . Cervical disc disease   . Chronic kidney disease   . COLONIC POLYPS, HX OF 06/27/2007  . EXOGENOUS OBESITY 01/30/2010  . GOUT 01/30/2010  . HYPERCHOLESTEROLEMIA 06/30/2007  . HYPERTENSION 06/27/2007  . LOW BACK PAIN 06/27/2007  . NEPHROLITHIASIS, HX OF 06/27/2007  . OSTEOARTHRITIS 06/27/2007  . SLEEP APNEA, OBSTRUCTIVE, MODERATE 01/27/2009     Past Surgical History:  Procedure Laterality Date  . CARDIAC CATHETERIZATION    . JOINT REPLACEMENT Left 2005   knee  . LUMBAR LAMINECTOMY    . RADIOFREQUENCY ABLATION KIDNEY    . TOTAL KNEE ARTHROPLASTY     left     Family History  Problem Relation Age of Onset  . Hypertension Other   . Diabetes Sister   . Hyperlipidemia Sister   . Sleep apnea Sister      Social History   Socioeconomic History  . Marital status: Legally Separated    Spouse name: Not on file  . Number of children: Not on file  . Years of education: Not on file  . Highest education level: Not on file  Occupational History  . Not on file  Social Needs  . Financial resource strain: Not hard at all  . Food insecurity:    Worry: Never true    Inability: Never true  . Transportation needs:    Medical: No    Non-medical: No  Tobacco Use  . Smoking status: Former Smoker    Last attempt to quit: 07/23/1980     Years since quitting: 38.3  . Smokeless tobacco: Never Used  Substance and Sexual Activity  . Alcohol use: Yes    Alcohol/week: 1.0 standard drinks    Types: 1 Cans of beer per week    Comment: 5/mo  . Drug use: No  . Sexual activity: Not Currently  Lifestyle  . Physical activity:    Days per week: 2 days    Minutes per session: 20 min  . Stress: Not at all  Relationships  . Social  connections:    Talks on phone: Three times a week    Gets together: More than three times a week    Attends religious service: More than 4 times per year    Active member of club or organization: No    Attends meetings of clubs or organizations: Never    Relationship status: Patient refused  . Intimate partner violence:    Fear of current or ex partner: No    Emotionally abused: No    Physically abused: No    Forced sexual activity: No  Other Topics Concern  . Not on file  Social History Narrative   Pt lives alone. He has one son who lives within 3 miles who he usually sees daily. He has two sisters living in the next town over who he also sees on a weekly basis. He is a religious man who attends church at least 3 Sundays each month.   He operates [employer] full time.      Allergies  Allergen Reactions  . Codeine Phosphate      Prior to Admission medications   Medication Sig Start Date End Date Taking? Authorizing Provider  acetaminophen (TYLENOL) 500 MG tablet Take 500 mg by mouth See admin instructions. Takes 1000mg  in the morning and 1000mg  in the evening.   Yes [provider]  amLODipine (NORVASC) 10 MG tablet Take 1 tablet (10 mg total) by mouth daily. 02/11/18  Yes Weber, Sarah L, PA-C  calcium-vitamin D 250-100 MG-UNIT tablet Take 1 tablet by mouth 2 (two) times daily.   Yes [provider]  carvedilol (COREG) 25 MG tablet Take 25 mg by mouth daily. 11/22/17  Yes [provider]  cetirizine (ZYRTEC) 10 MG tablet Take 10 mg by mouth daily as needed for  allergies.    Yes [provider]  diphenhydramine-acetaminophen (TYLENOL PM) 25-500 MG TABS tablet Take 1 tablet by mouth at bedtime as needed (sleep).   Yes [provider]  doxycycline (VIBRA-TABS) 100 MG tablet Take 1 tablet (100 mg total) by mouth 2 (two) times daily. 10/29/18  Yes Wendie Agreste, MD  fluticasone (FLONASE) 50 MCG/ACT nasal spray Place 2 sprays into both nostrils daily. 09/16/18  Yes [name], MD  furosemide (LASIX) 80 MG tablet Take 1 tablet (80 mg total) by mouth daily. 08/29/18  Yes Mikhail, [place], DO  Multiple Vitamins-Minerals (ONE-A-DAY MENS 50+ ADVANTAGE PO) Take 1 tablet by mouth daily.   Yes [provider]  rosuvastatin (CRESTOR) 5 MG tablet Take 1 tablet (5 mg total) by mouth daily. 08/09/17  Yes Weber, Sarah L, PA-C  sodium bicarbonate 650 MG tablet Take 1 tablet (650 mg total) by mouth 2 (two) times daily. 02/11/18  Yes Weber, Sarah L, PA-C  traMADol (ULTRAM) 50 MG tablet Take 1 tablet (50 mg total) by mouth every 12 (twelve) hours as needed. 08/25/18  Yes [name], MD  ULORIC 40 MG tablet Take 40 mg by mouth every other day.  11/06/16  Yes [provider]     Depression screen [facility] 2/9 11/07/2018 11/03/2018 10/29/2018 10/24/2018 10/22/2018  Decreased Interest 0 0 0 0 0  Down, Depressed, Hopeless 0 0 0 0 0  PHQ - 2 Score 0 0 0 0 0     Fall Risk  11/07/2018 11/03/2018 10/29/2018 10/24/2018 10/22/2018  Falls in the past year? 0 0 0 0 0  Number falls in past yr: - 0 - - 0  Injury with Fall? 0 0  0 - 0  Comment - - - - -  Risk for fall due to : - - - - -  Follow up - - - - -      PHYSICAL EXAM: BP 137/82   Ht 6' (1.829 m)   Wt (!) 331 lb (150.1 kg)   BMI 44.89 kg/m  These represent patient reported vital signs taken at home. As this was a visit taking place via video chat, vital signs were not taken in office.   Wt Readings from Last 3 Encounters:  11/07/18 (!) 331 lb (150.1 kg)  09/17/18 (!) 331 lb 3.2 oz (150.2 kg)   09/16/18 (!) 330 lb (149.7 kg)     No exam data present    Physical Exam As this visit was conducted by telehealth, a physical exam did not take place.  Education/Counseling provided regarding diet and exercise, prevention of chronic diseases, smoking/tobacco cessation, if applicable, and reviewed "Covered Medicare Preventive Services."   ASSESSMENT/PLAN: 1. Medicare annual wellness visit, subsequent  Today with Mr. ANDREUS CURE , we discussed among other topics:  His goal to lose 30 lbs within the next year. We discussed S.M.A.R.T. goal setting, and emphasized the recommendation of 30 minutes of activity at least 4-5 days each week as ability allows. He stated that he has made improvements to his diet with more fresh fruit and vegetables.   Thank you for your visit with Primary Care at [facility] today.  Maximiano Coss, NP [facility] Primary Care at [address] [place], [place] [address] [phone] - 0000

## 2018-11-18 ENCOUNTER — Other Ambulatory Visit: Payer: Self-pay

## 2018-11-18 ENCOUNTER — Telehealth (INDEPENDENT_AMBULATORY_CARE_PROVIDER_SITE_OTHER): Payer: Medicare Other | Admitting: Family Medicine

## 2018-11-18 DIAGNOSIS — L03116 Cellulitis of left lower limb: Secondary | ICD-10-CM | POA: Diagnosis not present

## 2018-11-18 DIAGNOSIS — N185 Chronic kidney disease, stage 5: Secondary | ICD-10-CM

## 2018-11-18 DIAGNOSIS — I872 Venous insufficiency (chronic) (peripheral): Secondary | ICD-10-CM

## 2018-11-18 DIAGNOSIS — R609 Edema, unspecified: Secondary | ICD-10-CM

## 2018-11-18 MED ORDER — DOXYCYCLINE HYCLATE 100 MG PO TABS: 100.0000 mg | ORAL_TABLET | Freq: Two times a day (BID) | ORAL | 0 refills | Status: DC

## 2018-11-18 NOTE — Progress Notes (Signed)
Virtual Visit via Video Note  I connected with Alex Williams on 11/18/18 at 10:04 AM by a video enabled telemedicine application - facetime used as he was unable to Webex or Doximity video. Verified that I am speaking with the correct person using two identifiers.   I discussed the limitations, risks, security and privacy concerns of performing an evaluation and management service by telephone and the availability of in person appointments. I also discussed with the patient that there may be a patient responsible charge related to this service. The patient expressed understanding and agreed to proceed, consent obtained  Chief complaint:  Left leg cellulitis.   History of Present Illness: Alex Williams is a 67 y.o. male  Follow-up of left leg cellulitis.  Initially diagnosed April 1, left lower leg.  Suspected cellulitis plus or minus component of stasis dermatitis.  Initially treated with Keflex 500 mg twice daily with renal dosing due to chronic kidney disease.  Increased erythema April 8, doxycycline 100 mg twice daily added.  Compressive stockings and elevation discussed as part of treatment.  Significantly better April 13 with less redness and soreness.  Had stopped Keflex at that time but continued doxycycline.  Was wearing compression stockings.  Doing better. Redness improved, some redness still there. Some flaking of skin. Still some swelling with Left ankle swelling Finished doxycycline 11 days ago. Redness came back then resolved last week.  Wearing compression stockings about 5-6 hours per day - some discomfort. Some difficulty elevating legs during day. Swelling improves over night, no dyspnea or chest pains.   Hx CKD, unknown next appt. Takes 80mg  lasix per day.    Patient Active Problem List   Diagnosis Date Noted  . Acute on chronic renal failure ([place]) 08/26/2018  . Nonspecific abnormal electrocardiogram (ECG) (EKG) 08/25/2018  . Anemia in CKD (chronic kidney disease)  08/25/2018  . Elevated troponin 08/25/2018  . SOB (shortness of breath) 08/25/2018  . Acute pulmonary edema (HCC)   . Acute renal failure superimposed on stage 5 chronic kidney disease, not on chronic dialysis ([place]) 02/13/2018  . Morbid obesity with BMI of 45.0-49.9, adult ([place]) 12/03/2013  . Renal mass 12/02/2013  . Lower extremity edema 03/16/2013  . CKD (chronic kidney disease), stage III ([place]) 12/10/2011  . GOUT 01/30/2010  . Obstructive sleep apnea 01/27/2009  . HYPERCHOLESTEROLEMIA 06/30/2007  . Essential hypertension 06/27/2007  . Osteoarthritis 06/27/2007  . LOW BACK PAIN 06/27/2007  . History of colonic polyps 06/27/2007  . NEPHROLITHIASIS, HX OF 06/27/2007   Past Medical History:  Diagnosis Date  . Cervical disc disease   . Chronic kidney disease   . COLONIC POLYPS, HX OF 06/27/2007  . EXOGENOUS OBESITY 01/30/2010  . GOUT 01/30/2010  . HYPERCHOLESTEROLEMIA 06/30/2007  . HYPERTENSION 06/27/2007  . LOW BACK PAIN 06/27/2007  . NEPHROLITHIASIS, HX OF 06/27/2007  . OSTEOARTHRITIS 06/27/2007  . SLEEP APNEA, OBSTRUCTIVE, MODERATE 01/27/2009   Past Surgical History:  Procedure Laterality Date  . CARDIAC CATHETERIZATION    . JOINT REPLACEMENT Left 2005   knee  . LUMBAR LAMINECTOMY    . RADIOFREQUENCY ABLATION KIDNEY    . TOTAL KNEE ARTHROPLASTY     left   Allergies  Allergen Reactions  . Codeine Phosphate    Prior to Admission medications   Medication Sig Start Date End Date Taking? Authorizing Provider  acetaminophen (TYLENOL) 500 MG tablet Take 500 mg by mouth See admin instructions. Takes 1000mg  in the morning and 1000mg  in the evening.   Yes  [provider]  amLODipine (NORVASC) 10 MG tablet Take 1 tablet (10 mg total) by mouth daily. 02/11/18  Yes Weber, Sarah L, PA-C  calcium-vitamin D 250-100 MG-UNIT tablet Take 1 tablet by mouth 2 (two) times daily.   Yes [provider]  carvedilol (COREG) 25 MG tablet Take 25 mg by mouth daily. 11/22/17  Yes  [provider]  cetirizine (ZYRTEC) 10 MG tablet Take 10 mg by mouth daily as needed for allergies.    Yes [provider]  diphenhydramine-acetaminophen (TYLENOL PM) 25-500 MG TABS tablet Take 1 tablet by mouth at bedtime as needed (sleep).   Yes [provider]  fluticasone (FLONASE) 50 MCG/ACT nasal spray Place 2 sprays into both nostrils daily. 09/16/18  Yes [name], MD  furosemide (LASIX) 80 MG tablet Take 1 tablet (80 mg total) by mouth daily. 08/29/18  Yes Mikhail, [place], DO  Multiple Vitamins-Minerals (ONE-A-DAY MENS 50+ ADVANTAGE PO) Take 1 tablet by mouth daily.   Yes [provider]  rosuvastatin (CRESTOR) 5 MG tablet Take 1 tablet (5 mg total) by mouth daily. 08/09/17  Yes Weber, Sarah L, PA-C  sodium bicarbonate 650 MG tablet Take 1 tablet (650 mg total) by mouth 2 (two) times daily. 02/11/18  Yes Weber, Sarah L, PA-C  traMADol (ULTRAM) 50 MG tablet Take 1 tablet (50 mg total) by mouth every 12 (twelve) hours as needed. 08/25/18  Yes [name], MD  ULORIC 40 MG tablet Take 40 mg by mouth every other day.  11/06/16  Yes [provider]   Social History   Socioeconomic History  . Marital status: Legally Separated    Spouse name: Not on file  . Number of children: Not on file  . Years of education: Not on file  . Highest education level: Not on file  Occupational History  . Not on file  Social Needs  . Financial resource strain: Not hard at all  . Food insecurity:    Worry: Never true    Inability: Never true  . Transportation needs:    Medical: No    Non-medical: No  Tobacco Use  . Smoking status: Former Smoker    Last attempt to quit: 07/23/1980    Years since quitting: 38.3  . Smokeless tobacco: Never Used  Substance and Sexual Activity  . Alcohol use: Yes    Alcohol/week: 1.0 standard drinks    Types: 1 Cans of beer per week    Comment: 5/mo  . Drug use: No  . Sexual activity: Not Currently  Lifestyle  .  Physical activity:    Days per week: 2 days    Minutes per session: 20 min  . Stress: Not at all  Relationships  . Social connections:    Talks on phone: Three times a week    Gets together: More than three times a week    Attends religious service: More than 4 times per year    Active member of club or organization: No    Attends meetings of clubs or organizations: Never    Relationship status: Patient refused  . Intimate partner violence:    Fear of current or ex partner: No    Emotionally abused: No    Physically abused: No    Forced sexual activity: No  Other Topics Concern  . Not on file  Social History Narrative   Pt lives alone. He has one son who lives within 3 miles who he usually sees daily. He has  two sisters living in the next town over who he also sees on a weekly basis. He is a religious man who attends church at least 3 Sundays each month.   He operates [employer] full time.     Observations/Objective: 1-2+ pitting edema both left and right pretibial area above the ankle.  There is some hyperpigmentation with very faint erythema at the most inferior aspect of his left anterior lower leg, but no surrounding erythema, no wounds/ulcers.  Minimal flaking of skin anteriorly.   Assessment and Plan: Cellulitis of leg, left - Plan: doxycycline (VIBRA-TABS) 100 MG tablet  Venous stasis dermatitis of left lower extremity  Peripheral edema  CKD (chronic kidney disease) stage 5, GFR less than 15 ml/min (HCC)  Cellulitis improved.  Still with persistent lower extremity edema, likely related in part due to CKD, probable component of PVD.  -Continue compression stockings, elevation is much as possible, handout given  -If redness returns, start doxycycline.  Placed on hold at pharmacy.  Schedule telemedicine visit if that is the case.  -If persistent edema, recommend he discuss with nephrologist to decide on dosing of diuretic.  -RTC precautions.  Follow Up Instructions:  Prn   Patient Instructions   Glad to hear the infection is doing better.  If redness starts to increase again, I did place a antibiotic at your pharmacy on hold if needed.  If you do require that antibiotic, please schedule visit so we can discuss it further and plan follow-up.  Continue to wear compression stockings as much as possible and elevate legs as much as possible.  See information below on peripheral edema.  If you continue to have swelling in your legs, I would recommend discussing that further with your nephrologist as they may need to adjust your fluid pill.  Return to the clinic or go to the nearest emergency room if any of your symptoms worsen or new symptoms occur.   Peripheral Edema  Peripheral edema is swelling that is caused by a buildup of fluid. Peripheral edema most often affects the lower legs, ankles, and feet. It can also develop in the arms, hands, and face. The area of the body that has peripheral edema will look swollen. It may also feel heavy or warm. Your clothes may start to feel tight. Pressing on the area may make a temporary dent in your skin. You may not be able to move your arm or leg as much as usual. There are many causes of peripheral edema. It can be a complication of other diseases, such as congestive heart failure, kidney disease, or a problem with your blood circulation. It also can be a side effect of certain medicines. It often happens to women during pregnancy. Sometimes, the cause is not known. Treating the underlying condition is often the only treatment for peripheral edema. Follow these instructions at home: Pay attention to any changes in your symptoms. Take these actions to help with your discomfort:  Raise (elevate) your legs while you are sitting or lying down.  Move around often to prevent stiffness and to lessen swelling. Do not sit or stand for long periods of time.  Wear support stockings as told by your health care provider.  Follow  instructions from your health care provider about limiting salt (sodium) in your diet. Sometimes eating less salt can reduce swelling.  Take over-the-counter and prescription medicines only as told by your health care provider. Your health care provider may prescribe medicine to help your body get rid  of excess water (diuretic).  Keep all follow-up visits as told by your health care provider. This is important. Contact a health care provider if:  You have a fever.  Your edema starts suddenly or is getting worse, especially if you are pregnant or have a medical condition.  You have swelling in only one leg.  You have increased swelling and pain in your legs. Get help right away if:  You develop shortness of breath, especially when you are lying down.  You have pain in your chest or abdomen.  You feel weak.  You faint. This information is not intended to replace advice given to you by your health care provider. Make sure you discuss any questions you have with your health care provider. Document Released: 08/16/2004 Document Revised: 12/12/2015 Document Reviewed: 01/19/2015 Elsevier Interactive Patient Education  [employer].    If you have lab work done today you will be contacted with your lab results within the next 2 weeks.  If you have not heard from [place] then please contact us. The fastest way to get your results is to register for My Chart.   IF you received an x-ray today, you will receive an invoice from [facility]. Please contact [facility] at [phone] with questions or concerns regarding your invoice.   IF you received labwork today, you will receive an invoice from [place]. Please contact [facility] at [phone] with questions or concerns regarding your invoice.   Our billing staff will not be able to assist you with questions regarding bills from these companies.  You will be contacted with the lab results as soon as they are available.  The fastest way to get your results is to activate your My Chart account. Instructions are located on the last page of this paperwork. If you have not heard from [place] regarding the results in 2 weeks, please contact this office.           I discussed the assessment and treatment plan with the patient. The patient was provided an opportunity to ask questions and all were answered. The patient agreed with the plan and demonstrated an understanding of the instructions.   The patient was advised to call back or seek an in-person evaluation if the symptoms worsen or if the condition fails to improve as anticipated.  I provided 9 minutes of non-face-to-face time during this encounter.   Wendie Agreste, MD

## 2018-11-18 NOTE — Progress Notes (Signed)
CC- Recheck left leg- F/u visit from 11/03/2018 visit dx with Cellulitis. Area is a doing better. Finished abx last week.

## 2018-11-18 NOTE — Patient Instructions (Addendum)
Glad to hear the infection is doing better.  If redness starts to increase again, I did place a antibiotic at your pharmacy on hold if needed.  If you do require that antibiotic, please schedule visit so we can discuss it further and plan follow-up.  Continue to wear compression stockings as much as possible and elevate legs as much as possible.  See information below on peripheral edema.  If you continue to have swelling in your legs, I would recommend discussing that further with your nephrologist as they may need to adjust your fluid pill.  Return to the clinic or go to the nearest emergency room if any of your symptoms worsen or new symptoms occur.   Peripheral Edema  Peripheral edema is swelling that is caused by a buildup of fluid. Peripheral edema most often affects the lower legs, ankles, and feet. It can also develop in the arms, hands, and face. The area of the body that has peripheral edema will look swollen. It may also feel heavy or warm. Your clothes may start to feel tight. Pressing on the area may make a temporary dent in your skin. You may not be able to move your arm or leg as much as usual. There are many causes of peripheral edema. It can be a complication of other diseases, such as congestive heart failure, kidney disease, or a problem with your blood circulation. It also can be a side effect of certain medicines. It often happens to women during pregnancy. Sometimes, the cause is not known. Treating the underlying condition is often the only treatment for peripheral edema. Follow these instructions at home: Pay attention to any changes in your symptoms. Take these actions to help with your discomfort:  Raise (elevate) your legs while you are sitting or lying down.  Move around often to prevent stiffness and to lessen swelling. Do not sit or stand for long periods of time.  Wear support stockings as told by your health care provider.  Follow instructions from your health care  provider about limiting salt (sodium) in your diet. Sometimes eating less salt can reduce swelling.  Take over-the-counter and prescription medicines only as told by your health care provider. Your health care provider may prescribe medicine to help your body get rid of excess water (diuretic).  Keep all follow-up visits as told by your health care provider. This is important. Contact a health care provider if:  You have a fever.  Your edema starts suddenly or is getting worse, especially if you are pregnant or have a medical condition.  You have swelling in only one leg.  You have increased swelling and pain in your legs. Get help right away if:  You develop shortness of breath, especially when you are lying down.  You have pain in your chest or abdomen.  You feel weak.  You faint. This information is not intended to replace advice given to you by your health care provider. Make sure you discuss any questions you have with your health care provider. Document Released: 08/16/2004 Document Revised: 12/12/2015 Document Reviewed: 01/19/2015 Elsevier Interactive Patient Education  [employer].    If you have lab work done today you will be contacted with your lab results within the next 2 weeks.  If you have not heard from [place] then please contact us. The fastest way to get your results is to register for My Chart.   IF you received an x-ray today, you will receive an invoice from [facility].  Please contact [facility] at [phone] with questions or concerns regarding your invoice.   IF you received labwork today, you will receive an invoice from [place]. Please contact [facility] at [phone] with questions or concerns regarding your invoice.   Our billing staff will not be able to assist you with questions regarding bills from these companies.  You will be contacted with the lab results as soon as they are available. The fastest way to get your results  is to activate your My Chart account. Instructions are located on the last page of this paperwork. If you have not heard from [place] regarding the results in 2 weeks, please contact this office.

## 2018-11-25 ENCOUNTER — Other Ambulatory Visit: Payer: Self-pay

## 2018-11-25 ENCOUNTER — Telehealth (INDEPENDENT_AMBULATORY_CARE_PROVIDER_SITE_OTHER): Payer: Medicare Other | Admitting: Family Medicine

## 2018-11-25 DIAGNOSIS — L03115 Cellulitis of right lower limb: Secondary | ICD-10-CM | POA: Diagnosis not present

## 2018-11-25 DIAGNOSIS — N185 Chronic kidney disease, stage 5: Secondary | ICD-10-CM

## 2018-11-25 DIAGNOSIS — R600 Localized edema: Secondary | ICD-10-CM

## 2018-11-25 MED ORDER — DOXYCYCLINE HYCLATE 100 MG PO TABS: 100.0000 mg | ORAL_TABLET | Freq: Two times a day (BID) | ORAL | 0 refills | Status: DC

## 2018-11-25 NOTE — Patient Instructions (Signed)
° ° ° °  If you have lab work done today you will be contacted with your lab results within the next 2 weeks.  If you have not heard from us then please contact us. The fastest way to get your results is to register for My Chart. ° ° °IF you received an x-ray today, you will receive an invoice from [facility]. Please contact [facility] at [phone] with questions or concerns regarding your invoice.  ° °IF you received labwork today, you will receive an invoice from [facility]. Please contact [facility] at [phone] with questions or concerns regarding your invoice.  ° °Our billing staff will not be able to assist you with questions regarding bills from these companies. ° °You will be contacted with the lab results as soon as they are available. The fastest way to get your results is to activate your My Chart account. Instructions are located on the last page of this paperwork. If you have not heard from us regarding the results in 2 weeks, please contact this office. °  ° ° ° °

## 2018-11-25 NOTE — Progress Notes (Signed)
CC- Swelling in right leg above the ankle for 1 week, worse on Sun 11/23/18. Painful when touch, redness. Have been seen for the left side on 11/18/18 the med helped but came back on the left leg.

## 2018-11-25 NOTE — Progress Notes (Signed)
Virtual Visit via video Note  I connected with Alex Williams on 11/25/18 at 9:44 AM by video facetime as difficulty with other platforms.  and verified that I am speaking with the correct person using two identifiers.   I discussed the limitations, risks, security and privacy concerns of performing an evaluation and management service by telephone and the availability of in person appointments. I also discussed with the patient that there may be a patient responsible charge related to this service. The patient expressed understanding and agreed to proceed, consent obtained  Chief complaint:  Leg cellulitis. Right sided.   History of Present Illness: Alex Williams is a 68 y.o. male  L leg cellulitis:  See prior evaluations.  Treated initially April 1st with keflex for LLE cellulitis.  Ultimately treated with doxyxycline and elevation, compression stockings..  Last visit 4/28 - redness improved, but still some left. Off abx for 11 days at that time.  Still wearing compression stockings, but sore on right leg now - hard to wear due to pain. Left leg has been doing ok.   R leg started now about 6 days ago.   Red and sore. Soreness worse than left was, but not as red. Sore on front of leg. No fever, feels well otherwise.  No history of DVT. No chest pains, no dyspnea.   Tx: blue emu topical - mod relief. Tylenol.   On lasix 80mg  qd prior, now additional 40mg  in afternoon has helped with leg swelling.      Patient Active Problem List   Diagnosis Date Noted  . Acute on chronic renal failure ([place]) 08/26/2018  . Nonspecific abnormal electrocardiogram (ECG) (EKG) 08/25/2018  . Anemia in CKD (chronic kidney disease) 08/25/2018  . Elevated troponin 08/25/2018  . SOB (shortness of breath) 08/25/2018  . Acute pulmonary edema (HCC)   . Acute renal failure superimposed on stage 5 chronic kidney disease, not on chronic dialysis ([place]) 02/13/2018  . Morbid obesity with BMI of 45.0-49.9, adult  ([place]) 12/03/2013  . Renal mass 12/02/2013  . Lower extremity edema 03/16/2013  . CKD (chronic kidney disease), stage III ([place]) 12/10/2011  . GOUT 01/30/2010  . Obstructive sleep apnea 01/27/2009  . HYPERCHOLESTEROLEMIA 06/30/2007  . Essential hypertension 06/27/2007  . Osteoarthritis 06/27/2007  . LOW BACK PAIN 06/27/2007  . History of colonic polyps 06/27/2007  . NEPHROLITHIASIS, HX OF 06/27/2007   Past Medical History:  Diagnosis Date  . Cervical disc disease   . Chronic kidney disease   . COLONIC POLYPS, HX OF 06/27/2007  . EXOGENOUS OBESITY 01/30/2010  . GOUT 01/30/2010  . HYPERCHOLESTEROLEMIA 06/30/2007  . HYPERTENSION 06/27/2007  . LOW BACK PAIN 06/27/2007  . NEPHROLITHIASIS, HX OF 06/27/2007  . OSTEOARTHRITIS 06/27/2007  . SLEEP APNEA, OBSTRUCTIVE, MODERATE 01/27/2009   Past Surgical History:  Procedure Laterality Date  . CARDIAC CATHETERIZATION    . JOINT REPLACEMENT Left 2005   knee  . LUMBAR LAMINECTOMY    . RADIOFREQUENCY ABLATION KIDNEY    . TOTAL KNEE ARTHROPLASTY     left   Allergies  Allergen Reactions  . Codeine Phosphate    Prior to Admission medications   Medication Sig Start Date End Date Taking? Authorizing Provider  acetaminophen (TYLENOL) 500 MG tablet Take 500 mg by mouth See admin instructions. Takes 1000mg  in the morning and 1000mg  in the evening.   Yes [provider]  calcium-vitamin D 250-100 MG-UNIT tablet Take 1 tablet by mouth 2 (two) times daily.   Yes  [provider]  carvedilol (COREG) 25 MG tablet Take 25 mg by mouth daily. 11/22/17  Yes [provider]  cetirizine (ZYRTEC) 10 MG tablet Take 10 mg by mouth daily as needed for allergies.    Yes [provider]  diphenhydramine-acetaminophen (TYLENOL PM) 25-500 MG TABS tablet Take 1 tablet by mouth at bedtime as needed (sleep).   Yes [provider]  fluticasone (FLONASE) 50 MCG/ACT nasal spray Place 2 sprays into both nostrils daily. 09/16/18  Yes  [name], MD  furosemide (LASIX) 80 MG tablet Take 1 tablet (80 mg total) by mouth daily. 08/29/18  Yes Mikhail, [place], DO  Multiple Vitamins-Minerals (ONE-A-DAY MENS 50+ ADVANTAGE PO) Take 1 tablet by mouth daily.   Yes [provider]  rosuvastatin (CRESTOR) 5 MG tablet Take 1 tablet (5 mg total) by mouth daily. 08/09/17  Yes Weber, Sarah L, PA-C  sodium bicarbonate 650 MG tablet Take 1 tablet (650 mg total) by mouth 2 (two) times daily. 02/11/18  Yes Weber, Sarah L, PA-C  traMADol (ULTRAM) 50 MG tablet Take 1 tablet (50 mg total) by mouth every 12 (twelve) hours as needed. 08/25/18  Yes [name], MD  ULORIC 40 MG tablet Take 40 mg by mouth every other day.  11/06/16  Yes [provider]  amLODipine (NORVASC) 10 MG tablet Take 1 tablet (10 mg total) by mouth daily. 02/11/18   Gale Journey Damaris Hippo, PA-C   Social History   Socioeconomic History  . Marital status: Legally Separated    Spouse name: Not on file  . Number of children: Not on file  . Years of education: Not on file  . Highest education level: Not on file  Occupational History  . Not on file  Social Needs  . Financial resource strain: Not hard at all  . Food insecurity:    Worry: Never true    Inability: Never true  . Transportation needs:    Medical: No    Non-medical: No  Tobacco Use  . Smoking status: Former Smoker    Last attempt to quit: 07/23/1980    Years since quitting: 38.3  . Smokeless tobacco: Never Used  Substance and Sexual Activity  . Alcohol use: Yes    Alcohol/week: 1.0 standard drinks    Types: 1 Cans of beer per week    Comment: 5/mo  . Drug use: No  . Sexual activity: Not Currently  Lifestyle  . Physical activity:    Days per week: 2 days    Minutes per session: 20 min  . Stress: Not at all  Relationships  . Social connections:    Talks on phone: Three times a week    Gets together: More than three times a week    Attends religious service: More than 4 times per year     Active member of club or organization: No    Attends meetings of clubs or organizations: Never    Relationship status: Patient refused  . Intimate partner violence:    Fear of current or ex partner: No    Emotionally abused: No    Physically abused: No    Forced sexual activity: No  Other Topics Concern  . Not on file  Social History Narrative   Pt lives alone. He has one son who lives within 3 miles who he usually sees daily. He has two sisters living in the next town over who he also sees on a weekly basis. He is a religious man  who attends church at least 3 Sundays each month.   He operates [employer] full time.      Observations/Objective: No distress, appropriate responses  Erythema 6.5 inches vertical, few inches horizontally.  Erythema appears to be centered anteriorly on the right lower leg, did not see any posterior involvement, and reports the anterior lower leg is the area of discomfort.  No ulceration, no blistering or wounds seen.  1+ pedal edema bilaterally at ankles  Assessment and Plan: Cellulitis of right leg - Plan: doxycycline (VIBRA-TABS) 100 MG tablet  Pedal edema  CKD (chronic kidney disease) stage 5, GFR less than 15 ml/min (HCC)  Prior left leg cellulitis now improved after few courses of antibiotics.  Thought to have component of stasis dermatitis from pedal edema.  Was improving with compression stockings elevation and higher dosing of furosemide.  Now with what appears to be right leg cellulitis.  -Start doxycycline as that was most effective for left leg symptoms.  Recheck 3 days.  Outlined area of erythema today for repeat video visit in the next few days with RTC/ER precautions if worsening sooner.  Follow Up Instructions: 3 days.    I discussed the assessment and treatment plan with the patient. The patient was provided an opportunity to ask questions and all were answered. The patient agreed with the plan and demonstrated an understanding of the  instructions.   The patient was advised to call back or seek an in-person evaluation if the symptoms worsen or if the condition fails to improve as anticipated.  I provided 8 minutes of non-face-to-face time during this encounter.  Signed,   Merri Ray, MD [facility].  11/25/18

## 2018-11-28 ENCOUNTER — Other Ambulatory Visit: Payer: Self-pay

## 2018-11-28 ENCOUNTER — Telehealth (INDEPENDENT_AMBULATORY_CARE_PROVIDER_SITE_OTHER): Payer: Medicare Other | Admitting: Family Medicine

## 2018-11-28 DIAGNOSIS — N185 Chronic kidney disease, stage 5: Secondary | ICD-10-CM

## 2018-11-28 DIAGNOSIS — L03115 Cellulitis of right lower limb: Secondary | ICD-10-CM

## 2018-11-28 DIAGNOSIS — R609 Edema, unspecified: Secondary | ICD-10-CM

## 2018-11-28 NOTE — Progress Notes (Signed)
Virtual Visit via Video Note  I connected with Alex Williams on 11/28/18 at 9:58 AM by a video enabled telemedicine application and verified that I am speaking with the correct person using two identifiers.   I discussed the limitations, risks, security and privacy concerns of performing an evaluation and management service by telephone and the availability of in person appointments. I also discussed with the patient that there may be a patient responsible charge related to this service. The patient expressed understanding and agreed to proceed, consent obtained  Chief complaint:  RLE cellulitis.    History of Present Illness: Alex Williams is a 68 y.o. male  R leg cellulitis.  See prior visits - had been treated for left leg cellulitis with keflex, then doxycycline that improved. Thought to have some element of stasis dermatitis with peripheral edema. he had been trying to use compression stockings but also added 40mg  additional lasix last week (80mg  in am, 40mg  in afternoon)  Recently noted in R leg. Started 9 days ago with R leg redness, soreness. Telemed visit 3 days ago - anterior R leg redness 6.5 inches vertically. No rash. started on doxycycline 100mg  BID.  Still sore, but seems to be getting better. Less redness.  Unable to use compress stocking on R leg d/t pain, but elevating legs.  No wounds/blisters. No calf pain.  No side effects of doxycycline. No diarrhea.  No fevers, feels well otherwise.  Patient Active Problem List   Diagnosis Date Noted  . Acute on chronic renal failure ([place]) 08/26/2018  . Nonspecific abnormal electrocardiogram (ECG) (EKG) 08/25/2018  . Anemia in CKD (chronic kidney disease) 08/25/2018  . Elevated troponin 08/25/2018  . SOB (shortness of breath) 08/25/2018  . Acute pulmonary edema (HCC)   . Acute renal failure superimposed on stage 5 chronic kidney disease, not on chronic dialysis ([place]) 02/13/2018  . Morbid obesity with BMI of 45.0-49.9, adult ([place])  12/03/2013  . Renal mass 12/02/2013  . Lower extremity edema 03/16/2013  . CKD (chronic kidney disease), stage III ([place]) 12/10/2011  . GOUT 01/30/2010  . Obstructive sleep apnea 01/27/2009  . HYPERCHOLESTEROLEMIA 06/30/2007  . Essential hypertension 06/27/2007  . Osteoarthritis 06/27/2007  . LOW BACK PAIN 06/27/2007  . History of colonic polyps 06/27/2007  . NEPHROLITHIASIS, HX OF 06/27/2007   Past Medical History:  Diagnosis Date  . Cervical disc disease   . Chronic kidney disease   . COLONIC POLYPS, HX OF 06/27/2007  . EXOGENOUS OBESITY 01/30/2010  . GOUT 01/30/2010  . HYPERCHOLESTEROLEMIA 06/30/2007  . HYPERTENSION 06/27/2007  . LOW BACK PAIN 06/27/2007  . NEPHROLITHIASIS, HX OF 06/27/2007  . OSTEOARTHRITIS 06/27/2007  . SLEEP APNEA, OBSTRUCTIVE, MODERATE 01/27/2009   Past Surgical History:  Procedure Laterality Date  . CARDIAC CATHETERIZATION    . JOINT REPLACEMENT Left 2005   knee  . LUMBAR LAMINECTOMY    . RADIOFREQUENCY ABLATION KIDNEY    . TOTAL KNEE ARTHROPLASTY     left   Allergies  Allergen Reactions  . Codeine Phosphate    Prior to Admission medications   Medication Sig Start Date End Date Taking? Authorizing Provider  acetaminophen (TYLENOL) 500 MG tablet Take 500 mg by mouth See admin instructions. Takes 1000mg  in the morning and 1000mg  in the evening.   Yes [provider]  amLODipine (NORVASC) 10 MG tablet Take 1 tablet (10 mg total) by mouth daily. 02/11/18  Yes Weber, Sarah L, PA-C  calcium-vitamin D 250-100 MG-UNIT tablet Take 1 tablet by mouth  2 (two) times daily.   Yes [provider]  carvedilol (COREG) 25 MG tablet Take 25 mg by mouth daily. 11/22/17  Yes [provider]  cetirizine (ZYRTEC) 10 MG tablet Take 10 mg by mouth daily as needed for allergies.    Yes [provider]  diphenhydramine-acetaminophen (TYLENOL PM) 25-500 MG TABS tablet Take 1 tablet by mouth at bedtime as needed (sleep).   Yes [provider]   doxycycline (VIBRA-TABS) 100 MG tablet Take 1 tablet (100 mg total) by mouth 2 (two) times daily. 11/25/18  Yes Wendie Agreste, MD  furosemide (LASIX) 80 MG tablet Take 1 tablet (80 mg total) by mouth daily. 08/29/18  Yes Mikhail, [place], DO  Multiple Vitamins-Minerals (ONE-A-DAY MENS 50+ ADVANTAGE PO) Take 1 tablet by mouth daily.   Yes [provider]  rosuvastatin (CRESTOR) 5 MG tablet Take 1 tablet (5 mg total) by mouth daily. 08/09/17  Yes Weber, Sarah L, PA-C  sodium bicarbonate 650 MG tablet Take 1 tablet (650 mg total) by mouth 2 (two) times daily. 02/11/18  Yes Weber, Sarah L, PA-C  traMADol (ULTRAM) 50 MG tablet Take 1 tablet (50 mg total) by mouth every 12 (twelve) hours as needed. 08/25/18  Yes [name], MD  ULORIC 40 MG tablet Take 40 mg by mouth every other day.  11/06/16  Yes [provider]  fluticasone (FLONASE) 50 MCG/ACT nasal spray Place 2 sprays into both nostrils daily. Patient not taking: Reported on 11/28/2018 09/16/18   [name], MD   Social History   Socioeconomic History  . Marital status: Legally Separated    Spouse name: Not on file  . Number of children: Not on file  . Years of education: Not on file  . Highest education level: Not on file  Occupational History  . Not on file  Social Needs  . Financial resource strain: Not hard at all  . Food insecurity:    Worry: Never true    Inability: Never true  . Transportation needs:    Medical: No    Non-medical: No  Tobacco Use  . Smoking status: Former Smoker    Last attempt to quit: 07/23/1980    Years since quitting: 38.3  . Smokeless tobacco: Never Used  Substance and Sexual Activity  . Alcohol use: Yes    Alcohol/week: 1.0 standard drinks    Types: 1 Cans of beer per week    Comment: 5/mo  . Drug use: No  . Sexual activity: Not Currently  Lifestyle  . Physical activity:    Days per week: 2 days    Minutes per session: 20 min  . Stress: Not at all  Relationships  .  Social connections:    Talks on phone: Three times a week    Gets together: More than three times a week    Attends religious service: More than 4 times per year    Active member of club or organization: No    Attends meetings of clubs or organizations: Never    Relationship status: Patient refused  . Intimate partner violence:    Fear of current or ex partner: No    Emotionally abused: No    Physically abused: No    Forced sexual activity: No  Other Topics Concern  . Not on file  Social History Narrative   Pt lives alone. He has one son who lives within 3 miles who he usually sees daily. He has two sisters living in the  next town over who he also sees on a weekly basis. He is a religious man who attends church at least 3 Sundays each month.   He operates [employer] full time.     Observations/Objective: Outlined areas from 2 days ago. Redness still present  Between lines, but faded. No wounds. Pitting edema into both ankles.  Speaking normally on phone, no distress.  Assessment and Plan: Cellulitis of leg, right  Peripheral edema  CKD (chronic kidney disease) stage 5, GFR less than 15 ml/min (HCC)  Recurrent cellulitis, initially left-sided that improved after multiple courses of antibiotics, now right-sided over the past 9 days.  Tolerating doxycycline with some possible interval improvement with decreased amount of redness but still in similar distribution.  No wounds or ulcerations seen.  I suspect some of this is due to peripheral edema/chronic peripheral edema, chronic kidney disease stage V.  Has been using higher dose of furosemide by 50%.  -Continue doxycycline same dose, elevate legs as much as possible as difficulty with compression stocking on sore right leg.  -Plan on in office evaluation in 3 days and can check electrolytes at that time including potassium at the higher dose of furosemide.  Feeling well at this time.  -ER/RTC precautions if worse sooner.  Follow Up  Instructions:    I discussed the assessment and treatment plan with the patient. The patient was provided an opportunity to ask questions and all were answered. The patient agreed with the plan and demonstrated an understanding of the instructions.   The patient was advised to call back or seek an in-person evaluation if the symptoms worsen or if the condition fails to improve as anticipated.  I provided 10 minutes of non-face-to-face time during this encounter.   Wendie Agreste, MD

## 2018-11-28 NOTE — Progress Notes (Signed)
Right lower leg f/u. It is some better, but still sore and red.

## 2018-11-28 NOTE — Patient Instructions (Signed)
No change in medicines at this time.  Continue to elevate legs as much as possible.  Follow-up in 3 days in the office and we can check blood work at that time.  Let me know if there are questions.       If you have lab work done today you will be contacted with your lab results within the next 2 weeks.  If you have not heard from [place] then please contact us. The fastest way to get your results is to register for My Chart.   IF you received an x-ray today, you will receive an invoice from [facility]. Please contact [facility] at [phone] with questions or concerns regarding your invoice.   IF you received labwork today, you will receive an invoice from [facility]. Please contact [facility] at [phone] with questions or concerns regarding your invoice.   Our billing staff will not be able to assist you with questions regarding bills from these companies.  You will be contacted with the lab results as soon as they are available. The fastest way to get your results is to activate your My Chart account. Instructions are located on the last page of this paperwork. If you have not heard from [place] regarding the results in 2 weeks, please contact this office.

## 2018-12-02 ENCOUNTER — Encounter: Payer: Self-pay | Admitting: Family Medicine

## 2018-12-02 ENCOUNTER — Other Ambulatory Visit: Payer: Self-pay

## 2018-12-02 ENCOUNTER — Ambulatory Visit (INDEPENDENT_AMBULATORY_CARE_PROVIDER_SITE_OTHER): Payer: Medicare Other | Admitting: Family Medicine

## 2018-12-02 VITALS — BP 109/66 | HR 90 | Temp 97.9°F | Resp 16 | Wt 320.2 lb

## 2018-12-02 DIAGNOSIS — N185 Chronic kidney disease, stage 5: Secondary | ICD-10-CM | POA: Diagnosis not present

## 2018-12-02 DIAGNOSIS — F5104 Psychophysiologic insomnia: Secondary | ICD-10-CM

## 2018-12-02 DIAGNOSIS — R609 Edema, unspecified: Secondary | ICD-10-CM

## 2018-12-02 DIAGNOSIS — L03115 Cellulitis of right lower limb: Secondary | ICD-10-CM | POA: Diagnosis not present

## 2018-12-02 MED ORDER — CEPHALEXIN 500 MG PO CAPS: 500.0000 mg | ORAL_CAPSULE | Freq: Two times a day (BID) | ORAL | 0 refills | Status: DC

## 2018-12-02 MED ORDER — ALPRAZOLAM 0.5 MG PO TABS: 0.2500 mg | ORAL_TABLET | Freq: Every evening | ORAL | 0 refills | Status: DC | PRN

## 2018-12-02 MED ORDER — TRAMADOL HCL 50 MG PO TABS: 50.0000 mg | ORAL_TABLET | Freq: Two times a day (BID) | ORAL | 0 refills | Status: AC | PRN

## 2018-12-02 NOTE — Patient Instructions (Addendum)
For leg infection, continue doxycycline, add cephalexin.  Tylenol if needed for pain, or if more severe pain I did write for a short course of tramadol he can be taken up to every 12 hours if needed.  Continue leg elevation, compression stockings once tolerated, recheck in 3 days.  See information below on difficulty with sleep.  I did refill alprazolam short-term for now, but if you continue to require that medication, please schedule appointment with your primary care provider to discuss other options.   Return to the clinic or go to the nearest emergency room if any of your symptoms worsen or new symptoms occur.   Insomnia Insomnia is a sleep disorder that makes it difficult to fall asleep or stay asleep. Insomnia can cause fatigue, low energy, difficulty concentrating, mood swings, and poor performance at work or school. There are three different ways to classify insomnia:  Difficulty falling asleep.  Difficulty staying asleep.  Waking up too early in the morning. Any type of insomnia can be long-term (chronic) or short-term (acute). Both are common. Short-term insomnia usually lasts for three months or less. Chronic insomnia occurs at least three times a week for longer than three months. What are the causes? Insomnia may be caused by another condition, situation, or substance, such as:  Anxiety.  Certain medicines.  Gastroesophageal reflux disease (GERD) or other gastrointestinal conditions.  Asthma or other breathing conditions.  Restless legs syndrome, sleep apnea, or other sleep disorders.  Chronic pain.  Menopause.  Stroke.  Abuse of alcohol, tobacco, or illegal drugs.  Mental health conditions, such as depression.  Caffeine.  Neurological disorders, such as Alzheimer's disease.  An overactive thyroid (hyperthyroidism). Sometimes, the cause of insomnia may not be known. What increases the risk? Risk factors for insomnia include:  Gender. Women are  affected more often than men.  Age. Insomnia is more common as you get older.  Stress.  Lack of exercise.  Irregular work schedule or working night shifts.  Traveling between different time zones.  Certain medical and mental health conditions. What are the signs or symptoms? If you have insomnia, the main symptom is having trouble falling asleep or having trouble staying asleep. This may lead to other symptoms, such as:  Feeling fatigued or having low energy.  Feeling nervous about going to sleep.  Not feeling rested in the morning.  Having trouble concentrating.  Feeling irritable, anxious, or depressed. How is this diagnosed? This condition may be diagnosed based on:  Your symptoms and medical history. Your health care provider may ask about: ? Your sleep habits. ? Any medical conditions you have. ? Your mental health.  A physical exam. How is this treated? Treatment for insomnia depends on the cause. Treatment may focus on treating an underlying condition that is causing insomnia. Treatment may also include:  Medicines to help you sleep.  Counseling or therapy.  Lifestyle adjustments to help you sleep better. Follow these instructions at home: Eating and drinking   Limit or avoid alcohol, caffeinated beverages, and cigarettes, especially close to bedtime. These can disrupt your sleep.  Do not eat a large meal or eat spicy foods right before bedtime. This can lead to digestive discomfort that can make it hard for you to sleep. Sleep habits   Keep a sleep diary to help you and your health care provider figure out what could be causing your insomnia. Write down: ? When you sleep. ? When you wake up during the night. ? How well you sleep. ?  How rested you feel the next day. ? Any side effects of medicines you are taking. ? What you eat and drink.  Make your bedroom a dark, comfortable place where it is easy to fall asleep. ? Put up shades or blackout  curtains to block light from outside. ? Use a white noise machine to block noise. ? Keep the temperature cool.  Limit screen use before bedtime. This includes: ? Watching TV. ? Using your smartphone, tablet, or computer.  Stick to a routine that includes going to bed and waking up at the same times every day and night. This can help you fall asleep faster. Consider making a quiet activity, such as reading, part of your nighttime routine.  Try to avoid taking naps during the day so that you sleep better at night.  Get out of bed if you are still awake after 15 minutes of trying to sleep. Keep the lights down, but try reading or doing a quiet activity. When you feel sleepy, go back to bed. General instructions  Take over-the-counter and prescription medicines only as told by your health care provider.  Exercise regularly, as told by your health care provider. Avoid exercise starting several hours before bedtime.  Use relaxation techniques to manage stress. Ask your health care provider to suggest some techniques that may work well for you. These may include: ? Breathing exercises. ? Routines to release muscle tension. ? Visualizing peaceful scenes.  Make sure that you drive carefully. Avoid driving if you feel very sleepy.  Keep all follow-up visits as told by your health care provider. This is important. Contact a health care provider if:  You are tired throughout the day.  You have trouble in your daily routine due to sleepiness.  You continue to have sleep problems, or your sleep problems get worse. Get help right away if:  You have serious thoughts about hurting yourself or someone else. If you ever feel like you may hurt yourself or others, or have thoughts about taking your own life, get help right away. You can go to your nearest emergency department or call:  Your local emergency services (911 in the [place]  A suicide crisis helpline, such as the [place] at (332)584-5129. This is open 24 hours a day. Summary  Insomnia is a sleep disorder that makes it difficult to fall asleep or stay asleep.  Insomnia can be long-term (chronic) or short-term (acute).  Treatment for insomnia depends on the cause. Treatment may focus on treating an underlying condition that is causing insomnia.  Keep a sleep diary to help you and your health care provider figure out what could be causing your insomnia. This information is not intended to replace advice given to you by your health care provider. Make sure you discuss any questions you have with your health care provider. Document Released: 07/06/2000 Document Revised: 04/18/2017 Document Reviewed: 04/18/2017 Elsevier Interactive Patient Education  2019 [employer]   Cellulitis, Adult  Cellulitis is a skin infection. The infected area is usually warm, red, swollen, and tender. This condition occurs most often in the arms and lower legs. The infection can travel to the muscles, blood, and underlying tissue and become serious. It is very important to get treated for this condition. What are the causes? Cellulitis is caused by bacteria. The bacteria enter through a break in the skin, such as a cut, burn, insect bite, open sore, or crack. What increases the risk? This condition is more likely to  occur in people who:  Have a weak body defense system (immune system).  Have open wounds on the skin, such as cuts, burns, bites, and scrapes. Bacteria can enter the body through these open wounds.  Are older than 69 years of age.  Have diabetes.  Have a type of long-lasting (chronic) liver disease (cirrhosis) or kidney disease.  Are obese.  Have a skin condition such as: ? Itchy rash (eczema). ? Slow movement of blood in the veins (venous stasis). ? Fluid buildup below the skin (edema).  Have had radiation therapy.  Use IV drugs. What are the signs or symptoms? Symptoms of this  condition include:  Redness, streaking, or spotting on the skin.  Swollen area of the skin.  Tenderness or pain when an area of the skin is touched.  Warm skin.  A fever.  Chills.  Blisters. How is this diagnosed? This condition is diagnosed based on a medical history and physical exam. You may also have tests, including:  Blood tests.  Imaging tests. How is this treated? Treatment for this condition may include:  Medicines, such as antibiotic medicines or medicines to treat allergies (antihistamines).  Supportive care, such as rest and application of cold or warm cloths (compresses) to the skin.  Hospital care, if the condition is severe. The infection usually starts to get better within 1-2 days of treatment. Follow these instructions at home:  Medicines  Take over-the-counter and prescription medicines only as told by your health care provider.  If you were prescribed an antibiotic medicine, take it as told by your health care provider. Do not stop taking the antibiotic even if you start to feel better. General instructions  Drink enough fluid to keep your urine pale yellow.  Do not touch or rub the infected area.  Raise (elevate) the infected area above the level of your heart while you are sitting or lying down.  Apply warm or cold compresses to the affected area as told by your health care provider.  Keep all follow-up visits as told by your health care provider. This is important. These visits let your health care provider make sure a more serious infection is not developing. Contact a health care provider if:  You have a fever.  Your symptoms do not begin to improve within 1-2 days of starting treatment.  Your bone or joint underneath the infected area becomes painful after the skin has healed.  Your infection returns in the same area or another area.  You notice a swollen bump in the infected area.  You develop new symptoms.  You have a general  ill feeling (malaise) with muscle aches and pains. Get help right away if:  Your symptoms get worse.  You feel very sleepy.  You develop vomiting or diarrhea that persists.  You notice red streaks coming from the infected area.  Your red area gets larger or turns dark in color. These symptoms may represent a serious problem that is an emergency. Do not wait to see if the symptoms will go away. Get medical help right away. Call your local emergency services (911 in the [place] Do not drive yourself to the hospital. Summary  Cellulitis is a skin infection. This condition occurs most often in the arms and lower legs.  Treatment for this condition may include medicines, such as antibiotic medicines or antihistamines.  Take over-the-counter and prescription medicines only as told by your health care provider. If you were prescribed an antibiotic medicine, do not stop taking the  antibiotic even if you start to feel better.  Contact a health care provider if your symptoms do not begin to improve within 1-2 days of starting treatment or your symptoms get worse.  Keep all follow-up visits as told by your health care provider. This is important. These visits let your health care provider make sure that a more serious infection is not developing. This information is not intended to replace advice given to you by your health care provider. Make sure you discuss any questions you have with your health care provider. Document Released: 04/18/2005 Document Revised: 11/28/2017 Document Reviewed: 11/28/2017 Elsevier Interactive Patient Education  [employer].   If you have lab work done today you will be contacted with your lab results within the next 2 weeks.  If you have not heard from [place] then please contact us. The fastest way to get your results is to register for My Chart.   IF you received an x-ray today, you will receive an invoice from [facility]. Please contact [facility]  Radiology at [phone] with questions or concerns regarding your invoice.   IF you received labwork today, you will receive an invoice from [place]. Please contact [facility] at [phone] with questions or concerns regarding your invoice.   Our billing staff will not be able to assist you with questions regarding bills from these companies.  You will be contacted with the lab results as soon as they are available. The fastest way to get your results is to activate your My Chart account. Instructions are located on the last page of this paperwork. If you have not heard from [place] regarding the results in 2 weeks, please contact this office.

## 2018-12-02 NOTE — Progress Notes (Signed)
Subjective:    Patient ID: Alex Williams, male    DOB: 1951-07-19, 68 y.o.   MRN: [id number]  HPI Alex Williams is a 68 y.o. male Presents today for: Chief Complaint  Patient presents with   Edema    Follow up from [place] visit. swelling was in the left now in the right leg for 2 weeks now. Swollen and very painful.. No sob . Patient have 3-4 more days of the abx   Medication Refill    would like to have a refill on xanax for sleep. Patient was informed he may need to get that from Dr Pamella Pert.   History of multiple medical problems including stage V chronic kidney disease, obesity, chronic peripheral edema.  Has been treated recently by myself for initially left lower extremity cellulitis, with initial approach of Keflex, then followed by doxycycline with improvement.  Now with right lower extremity cellulitis.  Right leg cellulitis: See prior visits. Left leg cellulitis had improved, right leg started approximately 12 to 13 days ago.  Initially discussed May 5.  Had been previously on Lasix 80 mg daily, but had added additional 40 mg in the afternoon to help with bilateral leg swelling over the past approximately 2 weeks. Elevation and compression stockings as tolerated.  Was not able to tolerate compression stocking to right leg due to pain. Doxycycline 100 mg twice daily started May 5 Recheck May 8 -slight improvement, less redness.  No wounds/blisters, tolerating doxycycline.  Left leg is less sore, and is continually improving.  R still sore - about the same as 3 days ago. No fever, no open wounds. Still on doxcycleine BID, no new side effects.  Propped up leg yesterday  - stayed home from work.   Still on lasix 51m QAM, 488mQpm. No recent nephrology eval - unknown next appt.   Plan on BMP today.   Insomnia Nervous at bedtime, edgy/nervous. Legs sore  - trouble going to sleep. Takes 1/2 xanax in past to help that worked well - twice per week lately. Last filled over a year  ago.  Prior tried tylenol PM and melatonin - no relief.  Controlled substance database (PDMP) reviewed. No concerns appreciated. Last fill date alprazolam #30 on 08/09/17.      Patient Active Problem List   Diagnosis Date Noted   Acute on chronic renal failure ([facility]02/10/2018   Nonspecific abnormal electrocardiogram (ECG) (EKG) 08/25/2018   Anemia in CKD (chronic kidney disease) 08/25/2018   Elevated troponin 08/25/2018   SOB (shortness of breath) 08/25/2018   Acute pulmonary edema (HCC)    Acute renal failure superimposed on stage 5 chronic kidney disease, not on chronic dialysis ([facility]07/25/2019   Morbid obesity with BMI of 45.0-49.9, adult ([facility]05/14/2015   Renal mass 12/02/2013   Lower extremity edema 03/16/2013   CKD (chronic kidney disease), stage III ([facility] [facility]05/20/2013   GOUT 01/30/2010   Obstructive sleep apnea 01/27/2009   HYPERCHOLESTEROLEMIA 06/30/2007   Essential hypertension 06/27/2007   Osteoarthritis 06/27/2007   LOW BACK PAIN 06/27/2007   History of colonic polyps 06/27/2007   NEPHROLITHIASIS, HX OF 06/27/2007   Past Medical History:  Diagnosis Date   Cervical disc disease    Chronic kidney disease    COLONIC POLYPS, HX OF 06/27/2007   EXOGENOUS OBESITY 01/30/2010   GOUT 01/30/2010   HYPERCHOLESTEROLEMIA 06/30/2007   HYPERTENSION 06/27/2007   LOW BACK PAIN 06/27/2007   NEPHROLITHIASIS, HX OF 06/27/2007   OSTEOARTHRITIS 06/27/2007   SLEEP  APNEA, OBSTRUCTIVE, MODERATE 01/27/2009   Past Surgical History:  Procedure Laterality Date   CARDIAC CATHETERIZATION     JOINT REPLACEMENT Left 2005   knee   LUMBAR LAMINECTOMY     RADIOFREQUENCY ABLATION KIDNEY     TOTAL KNEE ARTHROPLASTY     left   Allergies  Allergen Reactions   Codeine Phosphate    Prior to Admission medications   Medication Sig Start Date End Date Taking? Authorizing Provider  acetaminophen (TYLENOL) 500 MG tablet Take 500 mg by mouth See admin instructions.  Takes 1040m in the morning and 10070min the evening.   Yes [provider]  amLODipine (NORVASC) 10 MG tablet Take 1 tablet (10 mg total) by mouth daily. 02/11/18  Yes Weber, Sarah L, PA-C  calcium-vitamin D 250-100 MG-UNIT tablet Take 1 tablet by mouth 2 (two) times daily.   Yes [provider]  carvedilol (COREG) 25 MG tablet Take 25 mg by mouth daily. 11/22/17  Yes [provider]  cetirizine (ZYRTEC) 10 MG tablet Take 10 mg by mouth daily as needed for allergies.    Yes [provider]  diphenhydramine-acetaminophen (TYLENOL PM) 25-500 MG TABS tablet Take 1 tablet by mouth at bedtime as needed (sleep).   Yes [provider]  doxycycline (VIBRA-TABS) 100 MG tablet Take 1 tablet (100 mg total) by mouth 2 (two) times daily. 11/25/18  Yes GrWendie AgresteMD  fluticasone (FLONASE) 50 MCG/ACT nasal spray Place 2 sprays into both nostrils daily. 09/16/18  Yes SaRutherford GuysMD  furosemide (LASIX) 80 MG tablet Take 1 tablet (80 mg total) by mouth daily. 08/29/18  Yes Mikhail, MaGrand RiverDO  Multiple Vitamins-Minerals (ONE-A-DAY MENS 50+ ADVANTAGE PO) Take 1 tablet by mouth daily.   Yes [provider]  rosuvastatin (CRESTOR) 5 MG tablet Take 1 tablet (5 mg total) by mouth daily. 08/09/17  Yes Weber, Sarah L, PA-C  sodium bicarbonate 650 MG tablet Take 1 tablet (650 mg total) by mouth 2 (two) times daily. 02/11/18  Yes Weber, Sarah L, PA-C  traMADol (ULTRAM) 50 MG tablet Take 1 tablet (50 mg total) by mouth every 12 (twelve) hours as needed. 08/25/18  Yes SaRutherford GuysMD  ULORIC 40 MG tablet Take 40 mg by mouth every other day.  11/06/16  Yes [provider]   Social History   Socioeconomic History   Marital status: Legally Separated    Spouse name: Not on file   Number of children: Not on file   Years of education: Not on file   Highest education level: Not on file  Occupational History   Not on file  Social Needs   Financial  resource strain: Not hard at all   Food insecurity:    Worry: Never true    Inability: Never true   Transportation needs:    Medical: No    Non-medical: No  Tobacco Use   Smoking status: Former Smoker    Last attempt to quit: 07/23/1980    Years since quitting: 38.3   Smokeless tobacco: Never Used  Substance and Sexual Activity   Alcohol use: Yes    Alcohol/week: 1.0 standard drinks    Types: 1 Cans of beer per week    Comment: 5/mo   Drug use: No   Sexual activity: Not Currently  Lifestyle   Physical activity:    Days per week: 2 days    Minutes per session: 20 min   Stress: Not at all  Relationships  Social connections:    Talks on phone: Three times a week    Gets together: More than three times a week    Attends religious service: More than 4 times per year    Active member of club or organization: No    Attends meetings of clubs or organizations: Never    Relationship status: Patient refused   Intimate partner violence:    Fear of current or ex partner: No    Emotionally abused: No    Physically abused: No    Forced sexual activity: No  Other Topics Concern   Not on file  Social History Narrative   Pt lives alone. He has one son who lives within 3 miles who he usually sees daily. He has two sisters living in the next town over who he also sees on a weekly basis. He is a religious man who attends church at least 3 Sundays each month.   He operates [employer] full time.     Review of Systems  Constitutional: Negative for fever.  Respiratory: Negative for shortness of breath.   Cardiovascular: Negative for chest pain.  Musculoskeletal: Positive for myalgias.  Skin: Positive for color change. Negative for rash.       Objective:   Physical Exam Vitals signs reviewed.  Constitutional:      Appearance: He is well-developed.  HENT:     Head: Normocephalic and atraumatic.  Eyes:     Pupils: Pupils are equal, round, and reactive to light.  Neck:      Vascular: No carotid bruit or JVD.  Cardiovascular:     Rate and Rhythm: Normal rate and regular rhythm.     Heart sounds: Normal heart sounds. No murmur.  Pulmonary:     Effort: Pulmonary effort is normal.     Breath sounds: Normal breath sounds. No rales.     Comments: Clear. No rales.  Musculoskeletal:     Right lower leg: He exhibits tenderness (Tender to palpation on anterior leg, medial aspect of lower leg and area of erythema only.  Calves nontender, negative Homans bilaterally.). Edema (2+ mid to prox tibia) present.     Left lower leg: Edema (1+ mid tibia. ) present.       Legs:  Skin:    General: Skin is warm and dry.     Findings: Erythema present.  Neurological:     Mental Status: He is alert and oriented to person, place, and time.    Vitals:   12/02/18 0852  BP: 109/66  Pulse: 90  Resp: 16  Temp: 97.9 F (36.6 C)  TempSrc: Oral  SpO2: 98%  Weight: (!) 320 lb 3.2 oz (145.2 kg)       Assessment & Plan:    Alex Williams is a 68 y.o. male Cellulitis of leg, right - Plan: cephALEXin (KEFLEX) 500 MG capsule, traMADol (ULTRAM) 50 MG tablet Peripheral edema  -Initially left leg cellulitis, now right.  Persistent erythema, will add Keflex.  Continue doxycycline.  Potential side effects of medications were discussed.  -Continue to try to elevate legs as much as possible, compression stockings when tolerated.  Increase furosemide may also help.  Recheck 3 days  -Tylenol if needed for pain, short-term tramadol was provided if needed for more severe pain.  Potential side effects and risk discussed.  Renal dosing every 12 hours  Chronic kidney disease, stage 5 (HCC) - Plan: CANCELED: CMP14+EGFR  -BMP obtained, expect creatinine to be in the 7 range.  Primarily evaluating potassium and other electrolyte status with higher dose of furosemide.  Advised to call his nephrologist for follow-up as planned follow-up end of April did not occur.  No sign of pulmonary edema on exam  today.  Mentating appropriately.  Does not appear to need emergent dialysis at this time.  Psychophysiologic insomnia - Plan: ALPRAZolam (XANAX) 0.5 MG tablet  -Handout given on insomnia.  Agreed to refill alprazolam temporarily, lowest effective dose.  If persistent need, follow-up with primary care provider to discuss other potential options    Meds ordered this encounter  Medications   ALPRAZolam (XANAX) 0.5 MG tablet    Sig: Take 0.5-1 tablets (0.25-0.5 mg total) by mouth at bedtime as needed for anxiety or sleep.    Dispense:  15 tablet    Refill:  0   cephALEXin (KEFLEX) 500 MG capsule    Sig: Take 1 capsule (500 mg total) by mouth 2 (two) times daily.    Dispense:  20 capsule    Refill:  0   traMADol (ULTRAM) 50 MG tablet    Sig: Take 1 tablet (50 mg total) by mouth every 12 (twelve) hours as needed for up to 5 days.    Dispense:  15 tablet    Refill:  0   Patient Instructions    For leg infection, continue doxycycline, add cephalexin.  Tylenol if needed for pain, or if more severe pain I did write for a short course of tramadol he can be taken up to every 12 hours if needed.  Continue leg elevation, compression stockings once tolerated, recheck in 3 days.  See information below on difficulty with sleep.  I did refill alprazolam short-term for now, but if you continue to require that medication, please schedule appointment with your primary care provider to discuss other options.   Return to the clinic or go to the nearest emergency room if any of your symptoms worsen or new symptoms occur.   Insomnia Insomnia is a sleep disorder that makes it difficult to fall asleep or stay asleep. Insomnia can cause fatigue, low energy, difficulty concentrating, mood swings, and poor performance at work or school. There are three different ways to classify insomnia:  Difficulty falling asleep.  Difficulty staying asleep.  Waking up too early in the morning. Any type of insomnia  can be long-term (chronic) or short-term (acute). Both are common. Short-term insomnia usually lasts for three months or less. Chronic insomnia occurs at least three times a week for longer than three months. What are the causes? Insomnia may be caused by another condition, situation, or substance, such as:  Anxiety.  Certain medicines.  Gastroesophageal reflux disease (GERD) or other gastrointestinal conditions.  Asthma or other breathing conditions.  Restless legs syndrome, sleep apnea, or other sleep disorders.  Chronic pain.  Menopause.  Stroke.  Abuse of alcohol, tobacco, or illegal drugs.  Mental health conditions, such as depression.  Caffeine.  Neurological disorders, such as Alzheimer's disease.  An overactive thyroid (hyperthyroidism). Sometimes, the cause of insomnia may not be known. What increases the risk? Risk factors for insomnia include:  Gender. Women are affected more often than men.  Age. Insomnia is more common as you get older.  Stress.  Lack of exercise.  Irregular work schedule or working night shifts.  Traveling between different time zones.  Certain medical and mental health conditions. What are the signs or symptoms? If you have insomnia, the main symptom is having trouble falling asleep or having trouble staying  asleep. This may lead to other symptoms, such as:  Feeling fatigued or having low energy.  Feeling nervous about going to sleep.  Not feeling rested in the morning.  Having trouble concentrating.  Feeling irritable, anxious, or depressed. How is this diagnosed? This condition may be diagnosed based on:  Your symptoms and medical history. Your health care provider may ask about: ? Your sleep habits. ? Any medical conditions you have. ? Your mental health.  A physical exam. How is this treated? Treatment for insomnia depends on the cause. Treatment may focus on treating an underlying condition that is causing  insomnia. Treatment may also include:  Medicines to help you sleep.  Counseling or therapy.  Lifestyle adjustments to help you sleep better. Follow these instructions at home: Eating and drinking   Limit or avoid alcohol, caffeinated beverages, and cigarettes, especially close to bedtime. These can disrupt your sleep.  Do not eat a large meal or eat spicy foods right before bedtime. This can lead to digestive discomfort that can make it hard for you to sleep. Sleep habits   Keep a sleep diary to help you and your health care provider figure out what could be causing your insomnia. Write down: ? When you sleep. ? When you wake up during the night. ? How well you sleep. ? How rested you feel the next day. ? Any side effects of medicines you are taking. ? What you eat and drink.  Make your bedroom a dark, comfortable place where it is easy to fall asleep. ? Put up shades or blackout curtains to block light from outside. ? Use a white noise machine to block noise. ? Keep the temperature cool.  Limit screen use before bedtime. This includes: ? Watching TV. ? Using your smartphone, tablet, or computer.  Stick to a routine that includes going to bed and waking up at the same times every day and night. This can help you fall asleep faster. Consider making a quiet activity, such as reading, part of your nighttime routine.  Try to avoid taking naps during the day so that you sleep better at night.  Get out of bed if you are still awake after 15 minutes of trying to sleep. Keep the lights down, but try reading or doing a quiet activity. When you feel sleepy, go back to bed. General instructions  Take over-the-counter and prescription medicines only as told by your health care provider.  Exercise regularly, as told by your health care provider. Avoid exercise starting several hours before bedtime.  Use relaxation techniques to manage stress. Ask your health care provider to suggest  some techniques that may work well for you. These may include: ? Breathing exercises. ? Routines to release muscle tension. ? Visualizing peaceful scenes.  Make sure that you drive carefully. Avoid driving if you feel very sleepy.  Keep all follow-up visits as told by your health care provider. This is important. Contact a health care provider if:  You are tired throughout the day.  You have trouble in your daily routine due to sleepiness.  You continue to have sleep problems, or your sleep problems get worse. Get help right away if:  You have serious thoughts about hurting yourself or someone else. If you ever feel like you may hurt yourself or others, or have thoughts about taking your own life, get help right away. You can go to your nearest emergency department or call:  Your local emergency services (911 in the [place]  A suicide crisis helpline, such as the [place] at 740-790-8095. This is open 24 hours a day. Summary  Insomnia is a sleep disorder that makes it difficult to fall asleep or stay asleep.  Insomnia can be long-term (chronic) or short-term (acute).  Treatment for insomnia depends on the cause. Treatment may focus on treating an underlying condition that is causing insomnia.  Keep a sleep diary to help you and your health care provider figure out what could be causing your insomnia. This information is not intended to replace advice given to you by your health care provider. Make sure you discuss any questions you have with your health care provider. Document Released: 07/06/2000 Document Revised: 04/18/2017 Document Reviewed: 04/18/2017 Elsevier Interactive Patient Education  2019 [employer].   Cellulitis, Adult  Cellulitis is a skin infection. The infected area is usually warm, red, swollen, and tender. This condition occurs most often in the arms and lower legs. The infection can travel to the muscles, blood, and underlying  tissue and become serious. It is very important to get treated for this condition. What are the causes? Cellulitis is caused by bacteria. The bacteria enter through a break in the skin, such as a cut, burn, insect bite, open sore, or crack. What increases the risk? This condition is more likely to occur in people who:  Have a weak body defense system (immune system).  Have open wounds on the skin, such as cuts, burns, bites, and scrapes. Bacteria can enter the body through these open wounds.  Are older than 69 years of age.  Have diabetes.  Have a type of long-lasting (chronic) liver disease (cirrhosis) or kidney disease.  Are obese.  Have a skin condition such as: ? Itchy rash (eczema). ? Slow movement of blood in the veins (venous stasis). ? Fluid buildup below the skin (edema).  Have had radiation therapy.  Use IV drugs. What are the signs or symptoms? Symptoms of this condition include:  Redness, streaking, or spotting on the skin.  Swollen area of the skin.  Tenderness or pain when an area of the skin is touched.  Warm skin.  A fever.  Chills.  Blisters. How is this diagnosed? This condition is diagnosed based on a medical history and physical exam. You may also have tests, including:  Blood tests.  Imaging tests. How is this treated? Treatment for this condition may include:  Medicines, such as antibiotic medicines or medicines to treat allergies (antihistamines).  Supportive care, such as rest and application of cold or warm cloths (compresses) to the skin.  Hospital care, if the condition is severe. The infection usually starts to get better within 1-2 days of treatment. Follow these instructions at home:  Medicines  Take over-the-counter and prescription medicines only as told by your health care provider.  If you were prescribed an antibiotic medicine, take it as told by your health care provider. Do not stop taking the antibiotic even if you  start to feel better. General instructions  Drink enough fluid to keep your urine pale yellow.  Do not touch or rub the infected area.  Raise (elevate) the infected area above the level of your heart while you are sitting or lying down.  Apply warm or cold compresses to the affected area as told by your health care provider.  Keep all follow-up visits as told by your health care provider. This is important. These visits let your health care provider make sure a more serious infection is  not developing. Contact a health care provider if:  You have a fever.  Your symptoms do not begin to improve within 1-2 days of starting treatment.  Your bone or joint underneath the infected area becomes painful after the skin has healed.  Your infection returns in the same area or another area.  You notice a swollen bump in the infected area.  You develop new symptoms.  You have a general ill feeling (malaise) with muscle aches and pains. Get help right away if:  Your symptoms get worse.  You feel very sleepy.  You develop vomiting or diarrhea that persists.  You notice red streaks coming from the infected area.  Your red area gets larger or turns dark in color. These symptoms may represent a serious problem that is an emergency. Do not wait to see if the symptoms will go away. Get medical help right away. Call your local emergency services (911 in the [place] Do not drive yourself to the hospital. Summary  Cellulitis is a skin infection. This condition occurs most often in the arms and lower legs.  Treatment for this condition may include medicines, such as antibiotic medicines or antihistamines.  Take over-the-counter and prescription medicines only as told by your health care provider. If you were prescribed an antibiotic medicine, do not stop taking the antibiotic even if you start to feel better.  Contact a health care provider if your symptoms do not begin to improve within 1-2  days of starting treatment or your symptoms get worse.  Keep all follow-up visits as told by your health care provider. This is important. These visits let your health care provider make sure that a more serious infection is not developing. This information is not intended to replace advice given to you by your health care provider. Make sure you discuss any questions you have with your health care provider. Document Released: 04/18/2005 Document Revised: 11/28/2017 Document Reviewed: 11/28/2017 Elsevier Interactive Patient Education  [employer].   If you have lab work done today you will be contacted with your lab results within the next 2 weeks.  If you have not heard from [place] then please contact us. The fastest way to get your results is to register for My Chart.   IF you received an x-ray today, you will receive an invoice from [facility]. Please contact [facility] at [phone] with questions or concerns regarding your invoice.   IF you received labwork today, you will receive an invoice from [facility]. Please contact [facility] at [phone] with questions or concerns regarding your invoice.   Our billing staff will not be able to assist you with questions regarding bills from these companies.  You will be contacted with the lab results as soon as they are available. The fastest way to get your results is to activate your My Chart account. Instructions are located on the last page of this paperwork. If you have not heard from [place] regarding the results in 2 weeks, please contact this office.       Signed,   Merri Ray, MD [facility].  12/02/18 9:44 AM

## 2018-12-03 LAB — CMP14+EGFR
ALT: 9 IU/L (ref 0–44)
AST: 13 IU/L (ref 0–40)
Albumin/Globulin Ratio: 1.6 (ref 1.2–2.2)
Albumin: 3.9 g/dL (ref 3.8–4.8)
Alkaline Phosphatase: 75 IU/L (ref 39–117)
BUN/Creatinine Ratio: 12 (ref 10–24)
BUN: 99 mg/dL (ref 8–27)
Bilirubin Total: 0.3 mg/dL (ref 0.0–1.2)
CO2: 10 mmol/L — CL (ref 20–29)
Calcium: 10.5 mg/dL — ABNORMAL HIGH (ref 8.6–10.2)
Chloride: 109 mmol/L — ABNORMAL HIGH (ref 96–106)
Creatinine, Ser: 8.31 mg/dL (ref 0.76–1.27)
GFR calc Af Amer: 7 mL/min/{1.73_m2} — ABNORMAL LOW (ref >59)
GFR calc non Af Amer: 6 mL/min/{1.73_m2} — ABNORMAL LOW (ref >59)
Globulin, Total: 2.4 g/dL (ref 1.5–4.5)
Glucose: 109 mg/dL — ABNORMAL HIGH (ref 65–99)
Potassium: 4.5 mmol/L (ref 3.5–5.2)
Sodium: 141 mmol/L (ref 134–144)
Total Protein: 6.3 g/dL (ref 6.0–8.5)

## 2018-12-05 ENCOUNTER — Encounter: Payer: Self-pay | Admitting: Family Medicine

## 2018-12-05 ENCOUNTER — Ambulatory Visit (INDEPENDENT_AMBULATORY_CARE_PROVIDER_SITE_OTHER): Payer: Medicare Other | Admitting: Family Medicine

## 2018-12-05 ENCOUNTER — Other Ambulatory Visit: Payer: Self-pay

## 2018-12-05 VITALS — BP 112/62 | HR 79 | Temp 97.7°F | Ht 71.5 in | Wt 314.2 lb

## 2018-12-05 DIAGNOSIS — N185 Chronic kidney disease, stage 5: Secondary | ICD-10-CM | POA: Diagnosis not present

## 2018-12-05 DIAGNOSIS — L03115 Cellulitis of right lower limb: Secondary | ICD-10-CM

## 2018-12-05 DIAGNOSIS — R609 Edema, unspecified: Secondary | ICD-10-CM

## 2018-12-05 MED ORDER — DOXYCYCLINE HYCLATE 100 MG PO TABS: 100.0000 mg | ORAL_TABLET | Freq: Two times a day (BID) | ORAL | 0 refills | Status: DC

## 2018-12-05 NOTE — Progress Notes (Signed)
Subjective:    Patient ID: Alex Williams, male    DOB: Nov 13, 1950, 68 y.o.   MRN: [id number]  HPI Alex Williams is a 68 y.o. male Presents today for: Chief Complaint  Patient presents with  . Cellulitis    both legs going on 1 month. F/u appt   Follow-up of lower extremity cellulitis, initially left, than right.  See prior visits.  Complicated by history of chronic kidney disease with increased creatinine, lowered bicarb on most recent testing 3 days ago.  Phone call pending from nephrology.  Did talk to patient yesterday with improving erythema, edema had improved on higher dosing of Lasix, potassium was stable.  Denied mental status changes and was taking bicarb 3 times per day.  For cellulitis he has been treated with combination of doxycycline and Keflex, renally dosed at 500 mg twice daily.   Redness on R leg improved yesterday and today. No fever. Still some warmth. On keflex BID. No new side effects of meds. On last 2 doses of doxycycline.  No fever.  Not able to wear compression stocking on right.  Lasix 120mg  per day.  Wt Readings from Last 3 Encounters:  12/05/18 (!) 314 lb 3.2 oz (142.5 kg)  12/02/18 (!) 320 lb 3.2 oz (145.2 kg)  11/07/18 (!) 331 lb (150.1 kg)      Patient Active Problem List   Diagnosis Date Noted  . Acute on chronic renal failure ([place]) 08/26/2018  . Nonspecific abnormal electrocardiogram (ECG) (EKG) 08/25/2018  . Anemia in CKD (chronic kidney disease) 08/25/2018  . Elevated troponin 08/25/2018  . SOB (shortness of breath) 08/25/2018  . Acute pulmonary edema (H[facility])   . Acute renal failure superimposed on stage 5 chronic kidney disease, not on chronic dialysis ([place]) 02/13/2018  . Morbid obesity with BMI of 45.0-49.9, adult ([place]) 12/03/2013  . Renal mass 12/02/2013  . Lower extremity edema 03/16/2013  . CKD (chronic kidney disease), stage III ([place]) 12/10/2011  . GOUT 01/30/2010  . Obstructive sleep apnea 01/27/2009  . HYPERCHOLESTEROLEMIA  06/30/2007  . Essential hypertension 06/27/2007  . Osteoarthritis 06/27/2007  . LOW BACK PAIN 06/27/2007  . History of colonic polyps 06/27/2007  . NEPHROLITHIASIS, HX OF 06/27/2007   Past Medical History:  Diagnosis Date  . Cervical disc disease   . Chronic kidney disease   . COLONIC POLYPS, HX OF 06/27/2007  . EXOGENOUS OBESITY 01/30/2010  . GOUT 01/30/2010  . HYPERCHOLESTEROLEMIA 06/30/2007  . HYPERTENSION 06/27/2007  . LOW BACK PAIN 06/27/2007  . NEPHROLITHIASIS, HX OF 06/27/2007  . OSTEOARTHRITIS 06/27/2007  . SLEEP APNEA, OBSTRUCTIVE, MODERATE 01/27/2009   Past Surgical History:  Procedure Laterality Date  . CARDIAC CATHETERIZATION    . JOINT REPLACEMENT Left 2005   knee  . LUMBAR LAMINECTOMY    . RADIOFREQUENCY ABLATION KIDNEY    . TOTAL KNEE ARTHROPLASTY     left   Allergies  Allergen Reactions  . Codeine Phosphate    Prior to Admission medications   Medication Sig Start Date End Date Taking? Authorizing Provider  acetaminophen (TYLENOL) 500 MG tablet Take 500 mg by mouth See admin instructions. Takes 1000mg  in the morning and 1000mg  in the evening.   Yes [provider]  ALPRAZolam Duanne Moron) 0.5 MG tablet Take 0.5-1 tablets (0.25-0.5 mg total) by mouth at bedtime as needed for anxiety or sleep. 12/02/18  Yes Wendie Agreste, MD  amLODipine (NORVASC) 10 MG tablet Take 1 tablet (10 mg total) by mouth daily. 02/11/18  Yes Weber,  Damaris Hippo, PA-C  calcitRIOL (ROCALTROL) 0.25 MCG capsule  11/26/18  Yes [provider]  calcium-vitamin D 250-100 MG-UNIT tablet Take 1 tablet by mouth 2 (two) times daily.   Yes [provider]  carvedilol (COREG) 25 MG tablet Take 25 mg by mouth daily. 11/22/17  Yes [provider]  cephALEXin (KEFLEX) 500 MG capsule Take 1 capsule (500 mg total) by mouth 2 (two) times daily. 12/02/18  Yes Wendie Agreste, MD  cetirizine (ZYRTEC) 10 MG tablet Take 10 mg by mouth daily as needed for allergies.    Yes [provider]  diphenhydramine-acetaminophen (TYLENOL PM) 25-500 MG TABS tablet Take 1 tablet by mouth at bedtime as needed (sleep).   Yes [provider]  doxycycline (VIBRA-TABS) 100 MG tablet Take 1 tablet (100 mg total) by mouth 2 (two) times daily. 11/25/18  Yes Wendie Agreste, MD  furosemide (LASIX) 80 MG tablet Take 1 tablet (80 mg total) by mouth daily. 08/29/18  Yes Mikhail, Edom, DO  Multiple Vitamins-Minerals (ONE-A-DAY MENS 50+ ADVANTAGE PO) Take 1 tablet by mouth daily.   Yes [provider]  rosuvastatin (CRESTOR) 5 MG tablet Take 1 tablet (5 mg total) by mouth daily. 08/09/17  Yes Weber, Sarah L, PA-C  sodium bicarbonate 650 MG tablet Take 1 tablet (650 mg total) by mouth 2 (two) times daily. 02/11/18  Yes Weber, Sarah L, PA-C  traMADol (ULTRAM) 50 MG tablet Take 1 tablet (50 mg total) by mouth every 12 (twelve) hours as needed for up to 5 days. 12/02/18 12/07/18 Yes Wendie Agreste, MD  ULORIC 40 MG tablet Take 40 mg by mouth every other day.  11/06/16  Yes [provider]  fluticasone (FLONASE) 50 MCG/ACT nasal spray Place 2 sprays into both nostrils daily. Patient not taking: Reported on 12/05/2018 09/16/18   [name], MD   Social History   Socioeconomic History  . Marital status: Legally Separated    Spouse name: Not on file  . Number of children: Not on file  . Years of education: Not on file  . Highest education level: Not on file  Occupational History  . Not on file  Social Needs  . Financial resource strain: Not hard at all  . Food insecurity:    Worry: Never true    Inability: Never true  . Transportation needs:    Medical: No    Non-medical: No  Tobacco Use  . Smoking status: Former Smoker    Last attempt to quit: 07/23/1980    Years since quitting: 38.3  . Smokeless tobacco: Never Used  Substance and Sexual Activity  . Alcohol use: Yes    Alcohol/week: 1.0 standard drinks    Types: 1 Cans of beer per week    Comment: 5/mo  . Drug  use: No  . Sexual activity: Not Currently  Lifestyle  . Physical activity:    Days per week: 2 days    Minutes per session: 20 min  . Stress: Not at all  Relationships  . Social connections:    Talks on phone: Three times a week    Gets together: More than three times a week    Attends religious service: More than 4 times per year    Active member of club or organization: No    Attends meetings of clubs or organizations: Never    Relationship status: Patient refused  . Intimate partner violence:    Fear of current or ex partner: No  Emotionally abused: No    Physically abused: No    Forced sexual activity: No  Other Topics Concern  . Not on file  Social History Narrative   Pt lives alone. He has one son who lives within 3 miles who he usually sees daily. He has two sisters living in the next town over who he also sees on a weekly basis. He is a religious man who attends church at least 3 Sundays each month.   He operates [employer] full time.     Review of Systems     Objective:   Physical Exam Constitutional:      General: He is not in acute distress.    Appearance: He is well-developed.  HENT:     Head: Normocephalic and atraumatic.  Cardiovascular:     Rate and Rhythm: Normal rate.  Pulmonary:     Effort: Pulmonary effort is normal.  Musculoskeletal:     Right lower leg: Edema (2+ to prox tibia.) present.     Left lower leg: Edema (1+ prox tibia. ) present.  Skin:    Findings: Erythema present.       Neurological:     Mental Status: He is alert and oriented to person, place, and time.    Vitals:   12/05/18 0925  BP: 112/62  Pulse: 79  Temp: 97.7 F (36.5 C)  TempSrc: Oral  SpO2: 98%  Weight: (!) 314 lb 3.2 oz (142.5 kg)  Height: 5' 11.5" (1.816 m)       Assessment & Plan:   Alex Williams is a 68 y.o. male Stage 5 chronic kidney disease not on chronic dialysis ([place]) - Plan: Basic metabolic panel  Cellulitis of leg, right - Plan: doxycycline  (VIBRA-TABS) 100 MG tablet  Peripheral edema  Cellulitis appears to be stable and improving, continue leg elevation was discussed.  Reviewed labs including worsening renal function and plan for discussion with nephrologist regarding follow-up and adjustment of bicarb.  ER precautions given, follow-up 3 days.  Doxycycline extended for 4 additional days.  Meds ordered this encounter  Medications  . doxycycline (VIBRA-TABS) 100 MG tablet    Sig: Take 1 tablet (100 mg total) by mouth 2 (two) times daily.    Dispense:  8 tablet    Refill:  0   Patient Instructions   Try to elevate legs as much as possible the next few days.  I did extend the doxycycline for 4 additional days.  Continue Keflex twice per day.  If any increasing swelling, confusion, shortness of breath, or any worsening symptoms, be seen in the emergency room right away based on your last kidney function test.  I am waiting on a call back from your nephrologist but will recheck those levels today.  Recheck with me in 3 days.  Thanks for coming in today.     If you have lab work done today you will be contacted with your lab results within the next 2 weeks.  If you have not heard from [place] then please contact us. The fastest way to get your results is to register for My Chart.   IF you received an x-ray today, you will receive an invoice from [facility]. Please contact [facility] at [phone] with questions or concerns regarding your invoice.   IF you received labwork today, you will receive an invoice from [place]. Please contact [facility] at [phone] with questions or concerns regarding your invoice.   Our billing staff will not be able  to assist you with questions regarding bills from these companies.  You will be contacted with the lab results as soon as they are available. The fastest way to get your results is to activate your My Chart account. Instructions are located on the last page of this  paperwork. If you have not heard from [place] regarding the results in 2 weeks, please contact this office.       Signed,   Merri Ray, MD [facility].  12/09/18 12:14 PM

## 2018-12-05 NOTE — Patient Instructions (Addendum)
Try to elevate legs as much as possible the next few days.  I did extend the doxycycline for 4 additional days.  Continue Keflex twice per day.  If any increasing swelling, confusion, shortness of breath, or any worsening symptoms, be seen in the emergency room right away based on your last kidney function test.  I am waiting on a call back from your nephrologist but will recheck those levels today.  Recheck with me in 3 days.  Thanks for coming in today.     If you have lab work done today you will be contacted with your lab results within the next 2 weeks.  If you have not heard from [place] then please contact us. The fastest way to get your results is to register for My Chart.   IF you received an x-ray today, you will receive an invoice from [facility]. Please contact [facility] at [phone] with questions or concerns regarding your invoice.   IF you received labwork today, you will receive an invoice from [place]. Please contact [facility] at [phone] with questions or concerns regarding your invoice.   Our billing staff will not be able to assist you with questions regarding bills from these companies.  You will be contacted with the lab results as soon as they are available. The fastest way to get your results is to activate your My Chart account. Instructions are located on the last page of this paperwork. If you have not heard from [place] regarding the results in 2 weeks, please contact this office.

## 2018-12-06 LAB — BASIC METABOLIC PANEL
BUN/Creatinine Ratio: 11 (ref 10–24)
BUN: 102 mg/dL (ref 8–27)
CO2: 13 mmol/L — CL (ref 20–29)
Calcium: 10.2 mg/dL (ref 8.6–10.2)
Chloride: 103 mmol/L (ref 96–106)
Creatinine, Ser: 8.89 mg/dL (ref 0.76–1.27)
GFR calc Af Amer: 6 mL/min/{1.73_m2} — ABNORMAL LOW (ref >59)
GFR calc non Af Amer: 5 mL/min/{1.73_m2} — ABNORMAL LOW (ref >59)
Glucose: 99 mg/dL (ref 65–99)
Potassium: 4.5 mmol/L (ref 3.5–5.2)
Sodium: 137 mmol/L (ref 134–144)

## 2018-12-08 ENCOUNTER — Ambulatory Visit: Payer: Medicare Other | Admitting: Family Medicine

## 2018-12-09 ENCOUNTER — Other Ambulatory Visit: Payer: Self-pay | Admitting: Family Medicine

## 2018-12-09 ENCOUNTER — Encounter: Payer: Self-pay | Admitting: Family Medicine

## 2018-12-09 ENCOUNTER — Ambulatory Visit (INDEPENDENT_AMBULATORY_CARE_PROVIDER_SITE_OTHER): Payer: Medicare Other | Admitting: Family Medicine

## 2018-12-09 ENCOUNTER — Other Ambulatory Visit: Payer: Self-pay

## 2018-12-09 VITALS — BP 133/73 | HR 84 | Temp 98.7°F | Resp 18 | Ht 71.5 in | Wt 316.6 lb

## 2018-12-09 DIAGNOSIS — M79661 Pain in right lower leg: Secondary | ICD-10-CM

## 2018-12-09 DIAGNOSIS — N185 Chronic kidney disease, stage 5: Secondary | ICD-10-CM | POA: Diagnosis not present

## 2018-12-09 DIAGNOSIS — L03115 Cellulitis of right lower limb: Secondary | ICD-10-CM | POA: Diagnosis not present

## 2018-12-09 DIAGNOSIS — M7989 Other specified soft tissue disorders: Secondary | ICD-10-CM | POA: Diagnosis not present

## 2018-12-09 NOTE — Patient Instructions (Addendum)
Elevate legs as much as possible next 2 days to see if redness and swelling will continue to improve, continue same antibiotics for now. Keep follow up as planned with nephrology. Recheck with me in 3 days. If swelling is not improving, may need additional 40mg  of lasix (can discuss with nephrologist as well).  I will order an ultrasound to rule out blood clot.  Recheck in 3 days. If any worse symptoms sooner - go to emergency room.     [facility] AP[facility] Tomorrow 12/10/2018 at 1:00 Please arrive at 12:40 at [address] with photo ID and Insurance card.     If you have lab work done today you will be contacted with your lab results within the next 2 weeks.  If you have not heard from [place] then please contact us. The fastest way to get your results is to register for My Chart.   IF you received an x-ray today, you will receive an invoice from [facility]. Please contact [facility] at [phone] with questions or concerns regarding your invoice.   IF you received labwork today, you will receive an invoice from [place]. Please contact [facility] at [phone] with questions or concerns regarding your invoice.   Our billing staff will not be able to assist you with questions regarding bills from these companies.  You will be contacted with the lab results as soon as they are available. The fastest way to get your results is to activate your My Chart account. Instructions are located on the last page of this paperwork. If you have not heard from [place] regarding the results in 2 weeks, please contact this office.

## 2018-12-09 NOTE — Progress Notes (Signed)
Subjective:    Patient ID: Alex Williams, male    DOB: 1950/08/13, 68 y.o.   MRN: [id number]  HPI Alex Williams is a 68 y.o. male Presents today for: Chief Complaint  Patient presents with  . Leg Pain    right leg cellulitis follow up    History of chronic kidney disease, stage V, not on dialysis.  Has been treated for initial left leg cellulitis, now right leg cellulitis, slight improvement last visit with combination of doxycycline and Keflex.  Doxycycline was extended for an additional 4 days.  Labs were reviewed with patient with increasing creatinine, low bicarbonate, but no mental status changes, denied increased fluid and weight was actually improving.  Discussed with his nephrologist to plan to call him and advise of increased bicarbonate dosing and follow-up to discuss options regarding his worsening renal function and possible need for dialysis.  Has appt with nephrology this Thursday, and bicarb increased to 1300mg  BID.  No chest pain/dyspnea. No confusion.   Wt Readings from Last 3 Encounters:  12/09/18 (!) 316 lb 9.6 oz (143.6 kg)  12/05/18 (!) 314 lb 3.2 oz (142.5 kg)  12/02/18 (!) 320 lb 3.2 oz (145.2 kg)   Worked some yesterday and Friday, elevating legs, but not yet applying compression stocking.  Still taking doxycycline (3 more days). Off doxy for 1 day. Still on keflex BID.  No new side effects with antibiotics.  No fever.  Redness seemed worse yesterday - had been at work and leg was down for the mostpart. More swelling in leg, but that improved with elevation as well as redness with elevation.  Lasix 120mg  per day.  Calf more sore yesterday - better today. No hx of DVT. No recent surgery or travel - less activity with leg elevation.      Results for orders placed or performed in visit on 67/61/95  Basic metabolic panel  Result Value Ref Range   Glucose 99 65 - 99 mg/dL   BUN 102 (HH) 8 - 27 mg/dL   Creatinine, Ser 8.89 (HH) 0.76 - 1.27 mg/dL   GFR calc  non Af Amer 5 (L) >59 mL/min/1.73   GFR calc Af Amer 6 (L) >59 mL/min/1.73   BUN/Creatinine Ratio 11 10 - 24   Sodium 137 134 - 144 mmol/L   Potassium 4.5 3.5 - 5.2 mmol/L   Chloride 103 96 - 106 mmol/L   CO2 13 (LL) 20 - 29 mmol/L   Calcium 10.2 8.6 - 10.2 mg/dL    Patient Active Problem List   Diagnosis Date Noted  . Acute on chronic renal failure ([place]) 08/26/2018  . Nonspecific abnormal electrocardiogram (ECG) (EKG) 08/25/2018  . Anemia in CKD (chronic kidney disease) 08/25/2018  . Elevated troponin 08/25/2018  . SOB (shortness of breath) 08/25/2018  . Acute pulmonary edema (HCC)   . Acute renal failure superimposed on stage 5 chronic kidney disease, not on chronic dialysis ([place]) 02/13/2018  . Morbid obesity with BMI of 45.0-49.9, adult ([place]) 12/03/2013  . Renal mass 12/02/2013  . Lower extremity edema 03/16/2013  . CKD (chronic kidney disease), stage III ([place]) 12/10/2011  . GOUT 01/30/2010  . Obstructive sleep apnea 01/27/2009  . HYPERCHOLESTEROLEMIA 06/30/2007  . Essential hypertension 06/27/2007  . Osteoarthritis 06/27/2007  . LOW BACK PAIN 06/27/2007  . History of colonic polyps 06/27/2007  . NEPHROLITHIASIS, HX OF 06/27/2007   Past Medical History:  Diagnosis Date  . Cervical disc disease   . Chronic kidney disease   .  COLONIC POLYPS, HX OF 06/27/2007  . EXOGENOUS OBESITY 01/30/2010  . GOUT 01/30/2010  . HYPERCHOLESTEROLEMIA 06/30/2007  . HYPERTENSION 06/27/2007  . LOW BACK PAIN 06/27/2007  . NEPHROLITHIASIS, HX OF 06/27/2007  . OSTEOARTHRITIS 06/27/2007  . SLEEP APNEA, OBSTRUCTIVE, MODERATE 01/27/2009   Past Surgical History:  Procedure Laterality Date  . CARDIAC CATHETERIZATION    . JOINT REPLACEMENT Left 2005   knee  . LUMBAR LAMINECTOMY    . RADIOFREQUENCY ABLATION KIDNEY    . TOTAL KNEE ARTHROPLASTY     left   Allergies  Allergen Reactions  . Codeine Phosphate    Prior to Admission medications   Medication Sig Start Date End Date Taking? Authorizing  Provider  acetaminophen (TYLENOL) 500 MG tablet Take 500 mg by mouth See admin instructions. Takes 1000mg  in the morning and 1000mg  in the evening.   Yes [provider]  ALPRAZolam Duanne Moron) 0.5 MG tablet Take 0.5-1 tablets (0.25-0.5 mg total) by mouth at bedtime as needed for anxiety or sleep. 12/02/18  Yes Wendie Agreste, MD  amLODipine (NORVASC) 10 MG tablet Take 1 tablet (10 mg total) by mouth daily. 02/11/18  Yes Weber, Damaris Hippo, PA-C  calcitRIOL (ROCALTROL) 0.25 MCG capsule  11/26/18  Yes [provider]  calcium-vitamin D 250-100 MG-UNIT tablet Take 1 tablet by mouth 2 (two) times daily.   Yes [provider]  carvedilol (COREG) 25 MG tablet Take 25 mg by mouth daily. 11/22/17  Yes [provider]  cephALEXin (KEFLEX) 500 MG capsule Take 1 capsule (500 mg total) by mouth 2 (two) times daily. 12/02/18  Yes Wendie Agreste, MD  cetirizine (ZYRTEC) 10 MG tablet Take 10 mg by mouth daily as needed for allergies.    Yes [provider]  diphenhydramine-acetaminophen (TYLENOL PM) 25-500 MG TABS tablet Take 1 tablet by mouth at bedtime as needed (sleep).   Yes [provider]  doxycycline (VIBRA-TABS) 100 MG tablet Take 1 tablet (100 mg total) by mouth 2 (two) times daily. 11/25/18  Yes Wendie Agreste, MD  doxycycline (VIBRA-TABS) 100 MG tablet Take 1 tablet (100 mg total) by mouth 2 (two) times daily. 12/05/18  Yes Wendie Agreste, MD  fluticasone [facility]) 50 MCG/ACT nasal spray Place 2 sprays into both nostrils daily. 09/16/18  Yes [name], MD  furosemide (LASIX) 80 MG tablet Take 1 tablet (80 mg total) by mouth daily. 08/29/18  Yes Mikhail, [place], DO  Multiple Vitamins-Minerals (ONE-A-DAY MENS 50+ ADVANTAGE PO) Take 1 tablet by mouth daily.   Yes [provider]  rosuvastatin (CRESTOR) 5 MG tablet Take 1 tablet (5 mg total) by mouth daily. 08/09/17  Yes Weber, Sarah L, PA-C  sodium bicarbonate 650 MG tablet Take 1 tablet (650 mg  total) by mouth 2 (two) times daily. 02/11/18  Yes Weber, Sarah L, PA-C  ULORIC 40 MG tablet Take 40 mg by mouth every other day.  11/06/16  Yes [provider]   Social History   Socioeconomic History  . Marital status: Legally Separated    Spouse name: Not on file  . Number of children: Not on file  . Years of education: Not on file  . Highest education level: Not on file  Occupational History  . Not on file  Social Needs  . Financial resource strain: Not hard at all  . Food insecurity:    Worry: Never true    Inability: Never true  . Transportation needs:    Medical: No  Non-medical: No  Tobacco Use  . Smoking status: Former Smoker    Last attempt to quit: 07/23/1980    Years since quitting: 38.4  . Smokeless tobacco: Never Used  Substance and Sexual Activity  . Alcohol use: Yes    Alcohol/week: 1.0 standard drinks    Types: 1 Cans of beer per week    Comment: 5/mo  . Drug use: No  . Sexual activity: Not Currently  Lifestyle  . Physical activity:    Days per week: 2 days    Minutes per session: 20 min  . Stress: Not at all  Relationships  . Social connections:    Talks on phone: Three times a week    Gets together: More than three times a week    Attends religious service: More than 4 times per year    Active member of club or organization: No    Attends meetings of clubs or organizations: Never    Relationship status: Patient refused  . Intimate partner violence:    Fear of current or ex partner: No    Emotionally abused: No    Physically abused: No    Forced sexual activity: No  Other Topics Concern  . Not on file  Social History Narrative   Pt lives alone. He has one son who lives within 3 miles who he usually sees daily. He has two sisters living in the next town over who he also sees on a weekly basis. He is a religious man who attends church at least 3 Sundays each month.   He operates [employer] full time.     Review of Systems      Objective:   Physical Exam Vitals signs reviewed.  Constitutional:      General: He is not in acute distress.    Appearance: He is well-developed.  HENT:     Head: Normocephalic and atraumatic.  Eyes:     Pupils: Pupils are equal, round, and reactive to light.  Neck:     Vascular: No carotid bruit or JVD.  Cardiovascular:     Rate and Rhythm: Normal rate and regular rhythm.     Heart sounds: Normal heart sounds. No murmur.  Pulmonary:     Effort: Pulmonary effort is normal.     Breath sounds: Normal breath sounds. No rales.  Musculoskeletal:     Right lower leg: He exhibits tenderness and swelling (neg homans. ). Right lower leg edema: slight posterior calf ttp, no cords. circumference 49 cm at 15sm below patella  vs 45 cm on left.   Skin:    General: Skin is warm and dry.       Neurological:     Mental Status: He is alert and oriented to person, place, and time.    Vitals:   12/09/18 1130  BP: 133/73  Pulse: 84  Resp: 18  Temp: 98.7 F (37.1 C)  TempSrc: Oral  SpO2: 99%  Weight: (!) 316 lb 9.6 oz (143.6 kg)  Height: 5' 11.5" (1.816 m)       Assessment & Plan:  Alex Williams is a 68 y.o. male Stage 5 chronic kidney disease not on chronic dialysis (HCC)  Right leg swelling - Plan: CANCELED: VAS [place] LOWER EXTREMITY VENOUS (DVT)  Right calf pain - Plan: CANCELED: VAS [place] LOWER EXTREMITY VENOUS (DVT)  Cellulitis of leg, right  Increased swelling with some calf pain yesterday, but improved with elevation.  Still some discrepancy in calf size right versus left,  and this is the first time he has had posterior calf pain.  No known DVT risk factors other than some immobilization due to cellulitis.  -Check ultrasound of right lower extremity within the next 48 hours   -Continue doxycycline and Keflex, leg elevation as that appears to be improving erythema and swelling  -Option of additional 40 mg Lasix if needed for pedal edema, but can also be discussed with his  nephrologist with upcoming appointment in few days  -Discussed worsening creatinine, denies any change in symptoms.  Has nephrology follow-up in few days, but ER precautions discussed  -Recheck 3 days  No orders of the defined types were placed in this encounter.  Patient Instructions   Elevate legs as much as possible next 2 days to see if redness and swelling will continue to improve, continue same antibiotics for now. Keep follow up as planned with nephrology. Recheck with me in 3 days. If swelling is not improving, may need additional 40mg  of lasix (can discuss with nephrologist as well).  I will order an ultrasound to rule out blood clot.  Recheck in 3 days. If any worse symptoms sooner - go to emergency room.     UTLRASOUND APT Tomorrow 12/10/2018 at 1:00 Please arrive at 12:40 at [address] with photo ID and Insurance card.     If you have lab work done today you will be contacted with your lab results within the next 2 weeks.  If you have not heard from [place] then please contact us. The fastest way to get your results is to register for My Chart.   IF you received an x-ray today, you will receive an invoice from [facility]. Please contact [facility] at [phone] with questions or concerns regarding your invoice.   IF you received labwork today, you will receive an invoice from [facility]. Please contact [facility] at [phone] with questions or concerns regarding your invoice.   Our billing staff will not be able to assist you with questions regarding bills from these companies.  You will be contacted with the lab results as soon as they are available. The fastest way to get your results is to activate your My Chart account. Instructions are located on the last page of this paperwork. If you have not heard from [place] regarding the results in 2 weeks, please contact this office.      Signed,   Merri Ray, MD [facility] [place].  12/09/18 1:57 PM

## 2018-12-10 ENCOUNTER — Ambulatory Visit
Admission: RE | Admit: 2018-12-10 | Discharge: 2018-12-10 | Disposition: A | Discharge status: Home / Self Care | Payer: Medicare Other | Source: Ambulatory Visit | Attending: Family Medicine | Admitting: Family Medicine

## 2018-12-10 DIAGNOSIS — M79661 Pain in right lower leg: Secondary | ICD-10-CM

## 2018-12-10 DIAGNOSIS — M7989 Other specified soft tissue disorders: Secondary | ICD-10-CM

## 2018-12-12 ENCOUNTER — Ambulatory Visit (INDEPENDENT_AMBULATORY_CARE_PROVIDER_SITE_OTHER): Payer: Medicare Other | Admitting: Family Medicine

## 2018-12-12 ENCOUNTER — Other Ambulatory Visit: Payer: Self-pay

## 2018-12-12 ENCOUNTER — Encounter: Payer: Self-pay | Admitting: Family Medicine

## 2018-12-12 VITALS — BP 138/74 | HR 95 | Temp 98.6°F | Resp 20 | Ht 72.24 in | Wt 313.6 lb

## 2018-12-12 DIAGNOSIS — R600 Localized edema: Secondary | ICD-10-CM

## 2018-12-12 DIAGNOSIS — L03115 Cellulitis of right lower limb: Secondary | ICD-10-CM | POA: Diagnosis not present

## 2018-12-12 DIAGNOSIS — N185 Chronic kidney disease, stage 5: Secondary | ICD-10-CM | POA: Diagnosis not present

## 2018-12-12 MED ORDER — DOXYCYCLINE HYCLATE 100 MG PO TABS: 100.0000 mg | ORAL_TABLET | Freq: Two times a day (BID) | ORAL | 0 refills | Status: DC

## 2018-12-12 MED ORDER — CEPHALEXIN 500 MG PO CAPS: 500.0000 mg | ORAL_CAPSULE | Freq: Two times a day (BID) | ORAL | 0 refills | Status: DC

## 2018-12-12 NOTE — Patient Instructions (Addendum)
  As cellulitis appears to be improving, but not yet resolved, I think it is reasonable to extend antibiotics for 1 additional week.  If new side effects such as diarrhea, let me know right away.  If the redness is not resolving during that time, schedule follow-up visit within 1 week and we can discuss treatment at that time.  Continue to try to elevate legs as much as possible to help with swelling which I do think is contributing to some of the issues in the leg.  Continue routine follow-up with nephrologist regarding chronic kidney disease.    Return to the clinic or go to the nearest emergency room if any of your symptoms worsen or new symptoms occur.  If you have lab work done today you will be contacted with your lab results within the next 2 weeks.  If you have not heard from [place] then please contact us. The fastest way to get your results is to register for My Chart.   IF you received an x-ray today, you will receive an invoice from [facility]. Please contact [facility] at [phone] with questions or concerns regarding your invoice.   IF you received labwork today, you will receive an invoice from [place]. Please contact [facility] at [phone] with questions or concerns regarding your invoice.   Our billing staff will not be able to assist you with questions regarding bills from these companies.  You will be contacted with the lab results as soon as they are available. The fastest way to get your results is to activate your My Chart account. Instructions are located on the last page of this paperwork. If you have not heard from [place] regarding the results in 2 weeks, please contact this office.

## 2018-12-12 NOTE — Progress Notes (Signed)
Subjective:    Patient ID: Alex Williams, male    DOB: Nov 02, 1950, 68 y.o.   MRN: [id number]  HPI Declan A Williams is a 68 y.o. male Presents today for: Chief Complaint  Patient presents with  . Leg Swelling    X 4-5 weeks right leg  . Cellulitis    f/u   History of stage V chronic kidney disease, not on dialysis, with pedal edema, recurrent cellulitis, most recently treated for right leg cellulitis.  Overall has had some improvement in symptoms, did have episode of increased redness and pain but thought to be possibly due to increased swelling, and was improving at last visit.  Treated with doxycycline plus Keflex, both twice daily.  Elevation has been recommended previously.  Did have follow-up with his nephrologist yesterday.  Bicarb dosing was increased last week based on low CO2 on BMP. Due to persistent swelling, ultrasound was obtained since last visit, no sign of DVT.  Met with nephology yesterday - plan to initiate dialysis, nonemergent. Shunt placement initially.  Lab Results  Component Value Date   CREATININE 8.89 (HH) 12/05/2018   R leg swelling better, pain comes and goes. Redness a little better.  On lasix 153m mg qd. Told by nephrology that not much fluid present.  Last doxycycline this morning, keflex last dose yesterday am. 14 day course doxy, 10 days keflex.  History of episodic right knee pain, swelling in the past but has not had swelling of his knee in the past few weeks.  No prior right knee operations, did have some on the left.  Patient Active Problem List   Diagnosis Date Noted  . Acute on chronic renal failure ([place] 08/26/2018  . Nonspecific abnormal electrocardiogram (ECG) (EKG) 08/25/2018  . Anemia in CKD (chronic kidney disease) 08/25/2018  . Elevated troponin 08/25/2018  . SOB (shortness of breath) 08/25/2018  . Acute pulmonary edema (HCC)   . Acute renal failure superimposed on stage 5 chronic kidney disease, not on chronic dialysis ([place] 02/13/2018   . Morbid obesity with BMI of 45.0-49.9, adult ([place] 12/03/2013  . Renal mass 12/02/2013  . Lower extremity edema 03/16/2013  . CKD (chronic kidney disease), stage III ([place] 12/10/2011  . GOUT 01/30/2010  . Obstructive sleep apnea 01/27/2009  . HYPERCHOLESTEROLEMIA 06/30/2007  . Essential hypertension 06/27/2007  . Osteoarthritis 06/27/2007  . LOW BACK PAIN 06/27/2007  . History of colonic polyps 06/27/2007  . NEPHROLITHIASIS, HX OF 06/27/2007   Past Medical History:  Diagnosis Date  . Cervical disc disease   . Chronic kidney disease   . COLONIC POLYPS, HX OF 06/27/2007  . EXOGENOUS OBESITY 01/30/2010  . GOUT 01/30/2010  . HYPERCHOLESTEROLEMIA 06/30/2007  . HYPERTENSION 06/27/2007  . LOW BACK PAIN 06/27/2007  . NEPHROLITHIASIS, HX OF 06/27/2007  . OSTEOARTHRITIS 06/27/2007  . SLEEP APNEA, OBSTRUCTIVE, MODERATE 01/27/2009   Past Surgical History:  Procedure Laterality Date  . CARDIAC CATHETERIZATION    . JOINT REPLACEMENT Left 2005   knee  . LUMBAR LAMINECTOMY    . RADIOFREQUENCY ABLATION KIDNEY    . TOTAL KNEE ARTHROPLASTY     left   Allergies  Allergen Reactions  . Codeine Phosphate    Prior to Admission medications   Medication Sig Start Date End Date Taking? Authorizing Provider  acetaminophen (TYLENOL) 500 MG tablet Take 500 mg by mouth See admin instructions. Takes 10018min the morning and 100045mn the evening.   Yes [provider]  ALPRAZolam (XADuanne Moron.5 MG tablet Take  0.5-1 tablets (0.25-0.5 mg total) by mouth at bedtime as needed for anxiety or sleep. 12/02/18  Yes Wendie Agreste, MD  amLODipine (NORVASC) 10 MG tablet Take 1 tablet (10 mg total) by mouth daily. 02/11/18  Yes Weber, Damaris Hippo, PA-C  calcitRIOL (ROCALTROL) 0.25 MCG capsule  11/26/18  Yes [provider]  calcium-vitamin D 250-100 MG-UNIT tablet Take 1 tablet by mouth 2 (two) times daily.   Yes [provider]  carvedilol (COREG) 25 MG tablet Take 25 mg by mouth daily. 11/22/17  Yes  [provider]  cetirizine (ZYRTEC) 10 MG tablet Take 10 mg by mouth daily as needed for allergies.    Yes [provider]  fluticasone (FLONASE) 50 MCG/ACT nasal spray Place 2 sprays into both nostrils daily. 09/16/18  Yes [name], MD  furosemide (LASIX) 80 MG tablet Take 1 tablet (80 mg total) by mouth daily. 08/29/18  Yes Mikhail, [place], DO  Multiple Vitamins-Minerals (ONE-A-DAY MENS 50+ ADVANTAGE PO) Take 1 tablet by mouth daily.   Yes [provider]  rosuvastatin (CRESTOR) 5 MG tablet Take 1 tablet (5 mg total) by mouth daily. 08/09/17  Yes Weber, Sarah L, PA-C  sodium bicarbonate 650 MG tablet Take 1 tablet (650 mg total) by mouth 2 (two) times daily. 02/11/18  Yes Weber, Sarah L, PA-C  ULORIC 40 MG tablet Take 40 mg by mouth every other day.  11/06/16  Yes [provider]  cephALEXin (KEFLEX) 500 MG capsule Take 1 capsule (500 mg total) by mouth 2 (two) times daily. Patient not taking: Reported on 12/12/2018 12/02/18   Wendie Agreste, MD  diphenhydramine-acetaminophen (TYLENOL PM) 25-500 MG TABS tablet Take 1 tablet by mouth at bedtime as needed (sleep).    [provider]  doxycycline (VIBRA-TABS) 100 MG tablet Take 1 tablet (100 mg total) by mouth 2 (two) times daily. Patient not taking: Reported on 12/12/2018 11/25/18   Wendie Agreste, MD  doxycycline (VIBRA-TABS) 100 MG tablet Take 1 tablet (100 mg total) by mouth 2 (two) times daily. Patient not taking: Reported on 12/12/2018 12/05/18   Wendie Agreste, MD   Social History   Socioeconomic History  . Marital status: Legally Separated    Spouse name: Not on file  . Number of children: 2  . Years of education: Not on file  . Highest education level: Not on file  Occupational History  . Not on file  Social Needs  . Financial resource strain: Not hard at all  . Food insecurity:    Worry: Never true    Inability: Never true  . Transportation needs:    Medical: No     Non-medical: No  Tobacco Use  . Smoking status: Former Smoker    Last attempt to quit: 07/23/1980    Years since quitting: 38.4  . Smokeless tobacco: Never Used  Substance and Sexual Activity  . Alcohol use: Yes    Alcohol/week: 1.0 standard drinks    Types: 1 Cans of beer per week    Comment: 5/mo  . Drug use: No  . Sexual activity: Not Currently  Lifestyle  . Physical activity:    Days per week: 2 days    Minutes per session: 20 min  . Stress: Not at all  Relationships  . Social connections:    Talks on phone: Three times a week    Gets together: More than three times a week    Attends religious service: More than 4 times per  year    Active member of club or organization: No    Attends meetings of clubs or organizations: Never    Relationship status: Patient refused  . Intimate partner violence:    Fear of current or ex partner: No    Emotionally abused: No    Physically abused: No    Forced sexual activity: No  Other Topics Concern  . Not on file  Social History Narrative   Pt lives alone. He has one son who lives within 3 miles who he usually sees daily. He has two sisters living in the next town over who he also sees on a weekly basis. He is a religious man who attends church at least 3 Sundays each month.   He operates [employer] full time.     Review of Systems Per HPI, no fevers.    Objective:   Physical Exam Constitutional:      General: He is not in acute distress.    Appearance: He is well-developed.  HENT:     Head: Normocephalic and atraumatic.  Cardiovascular:     Rate and Rhythm: Normal rate.  Pulmonary:     Effort: Pulmonary effort is normal.  Musculoskeletal:     Right lower leg: Edema (1+ to prox tibia. ) present.     Left lower leg: Edema (trace-1+) present.  Skin:      Neurological:     Mental Status: He is alert and oriented to person, place, and time.    Vitals:   12/12/18 1032  BP: 138/74  Pulse: 95  Resp: 20  Temp: 98.6 F (37  C)  TempSrc: Oral  SpO2: 100%  Weight: (!) 313 lb 9.6 oz (142.2 kg)  Height: 6' 0.24" (1.835 m)      Assessment & Plan:   Kweli A Kinley is a 68 y.o. male Cellulitis of leg, right - Plan: doxycycline (VIBRA-TABS) 100 MG tablet, cephALEXin (KEFLEX) 500 MG capsule  Stage 5 chronic kidney disease not on chronic dialysis (HCC)  Pedal edema   Although slowly improving, has had improvement with antibiotic regimen of doxycycline plus Keflex.  Will extend for 1 additional week and is tolerating well at this time but potential side effects/risks were discussed with prolonged antibiotics.  RTC precautions if not improving/resolving with this course.  Continue leg elevation, furosemide for fluid retention and follow-up with nephrology with ER precautions discussed.    Meds ordered this encounter  Medications  . doxycycline (VIBRA-TABS) 100 MG tablet    Sig: Take 1 tablet (100 mg total) by mouth 2 (two) times daily.    Dispense:  14 tablet    Refill:  0  . cephALEXin (KEFLEX) 500 MG capsule    Sig: Take 1 capsule (500 mg total) by mouth 2 (two) times daily.    Dispense:  14 capsule    Refill:  0   Patient Instructions    As cellulitis appears to be improving, but not yet resolved, I think it is reasonable to extend antibiotics for 1 additional week.  If new side effects such as diarrhea, let me know right away.  If the redness is not resolving during that time, schedule follow-up visit within 1 week and we can discuss treatment at that time.  Continue to try to elevate legs as much as possible to help with swelling which I do think is contributing to some of the issues in the leg.  Continue routine follow-up with nephrologist regarding chronic kidney disease.  Return to the clinic or go to the nearest emergency room if any of your symptoms worsen or new symptoms occur.  If you have lab work done today you will be contacted with your lab results within the next 2 weeks.  If you have not  heard from [place] then please contact us. The fastest way to get your results is to register for My Chart.   IF you received an x-ray today, you will receive an invoice from [facility]. Please contact [place] [facility] at [phone] with questions or concerns regarding your invoice.   IF you received labwork today, you will receive an invoice from [place]. Please contact [facility] at [phone] with questions or concerns regarding your invoice.   Our billing staff will not be able to assist you with questions regarding bills from these companies.  You will be contacted with the lab results as soon as they are available. The fastest way to get your results is to activate your My Chart account. Instructions are located on the last page of this paperwork. If you have not heard from [place] regarding the results in 2 weeks, please contact this office.      Signed,   Merri Ray, MD [facility] [place].  12/12/18 11:37 AM

## 2018-12-14 ENCOUNTER — Encounter: Payer: Self-pay | Admitting: Family Medicine

## 2018-12-17 ENCOUNTER — Telehealth (INDEPENDENT_AMBULATORY_CARE_PROVIDER_SITE_OTHER): Payer: Medicare Other | Admitting: Family Medicine

## 2018-12-17 ENCOUNTER — Other Ambulatory Visit: Payer: Self-pay

## 2018-12-17 DIAGNOSIS — G4700 Insomnia, unspecified: Secondary | ICD-10-CM

## 2018-12-17 DIAGNOSIS — L03115 Cellulitis of right lower limb: Secondary | ICD-10-CM

## 2018-12-17 DIAGNOSIS — G2581 Restless legs syndrome: Secondary | ICD-10-CM

## 2018-12-17 DIAGNOSIS — N185 Chronic kidney disease, stage 5: Secondary | ICD-10-CM

## 2018-12-17 MED ORDER — ALPRAZOLAM 0.5 MG PO TABS: 0.2500 mg | ORAL_TABLET | Freq: Every evening | ORAL | 0 refills | Status: DC | PRN

## 2018-12-17 MED ORDER — ROPINIROLE HCL 0.25 MG PO TABS: 0.2500 mg | ORAL_TABLET | Freq: Every day | ORAL | 0 refills | Status: DC

## 2018-12-17 NOTE — Progress Notes (Signed)
Virtual Visit Note  I connected with patient on 12/17/18 at 1052 by phone and verified that I am speaking with the correct person using two identifiers. Alex Williams is currently located at work and patient is currently with them during visit. The provider, [name], MD is located in their office at time of visit.  I discussed the limitations, risks, security and privacy concerns of performing an evaluation and management service by telephone and the availability of in person appointments. I also discussed with the patient that there may be a patient responsible charge related to this service. The patient expressed understanding and agreed to proceed.   CC: routine folowup  HPI ? Patient is a 68 y.o. male with past medical history significant for HTN, HLP, gout, CKD stage 5 who presents today with several concerns  Last OV with me in Feb 2020 Since then seen Dr Carlota Raspberry multiple times for LE cellulitis Extended doxy and keflex for another week, just picked up prescriptions yesterday Doppler neg DVT Plan to start dialysis, pending vasc access Currently doing lasix 80mg  BID Has lost about 40 lbs Has been watching what he is eating Denies any SOB, cough  Having issues falling asleep Gets squirmy when he goes to sleep, legs are moving, walking is better Doing well on Ambien 5mg  and alprazolam 0.25mg  at bedtime pmp reviewed Ferritin in feb 2020, 60  BP Readings from Last 3 Encounters:  12/12/18 138/74  12/09/18 133/73  12/05/18 112/62    Allergies  Allergen Reactions  . Codeine Phosphate     Prior to Admission medications   Medication Sig Start Date End Date Taking? Authorizing Provider  acetaminophen (TYLENOL) 500 MG tablet Take 500 mg by mouth See admin instructions. Takes 1000mg  in the morning and 1000mg  in the evening.    [provider]  ALPRAZolam Duanne Moron) 0.5 MG tablet Take 0.5-1 tablets (0.25-0.5 mg total) by mouth at bedtime as needed for anxiety or  sleep. 12/02/18   Wendie Agreste, MD  amLODipine (NORVASC) 10 MG tablet Take 1 tablet (10 mg total) by mouth daily. 02/11/18   Gale Journey, Damaris Hippo, PA-C  calcitRIOL (ROCALTROL) 0.25 MCG capsule  11/26/18   [provider]  calcium-vitamin D 250-100 MG-UNIT tablet Take 1 tablet by mouth 2 (two) times daily.    [provider]  carvedilol (COREG) 25 MG tablet Take 25 mg by mouth daily. 11/22/17   [provider]  cephALEXin (KEFLEX) 500 MG capsule Take 1 capsule (500 mg total) by mouth 2 (two) times daily. 12/12/18   Wendie Agreste, MD  cetirizine (ZYRTEC) 10 MG tablet Take 10 mg by mouth daily as needed for allergies.     [provider]  diphenhydramine-acetaminophen (TYLENOL PM) 25-500 MG TABS tablet Take 1 tablet by mouth at bedtime as needed (sleep).    [provider]  doxycycline (VIBRA-TABS) 100 MG tablet Take 1 tablet (100 mg total) by mouth 2 (two) times daily. 12/12/18   Wendie Agreste, MD  fluticasone (FLONASE) 50 MCG/ACT nasal spray Place 2 sprays into both nostrils daily. 09/16/18   [name], MD  furosemide (LASIX) 80 MG tablet Take 1 tablet (80 mg total) by mouth daily. 08/29/18   Mikhail, Velta Addison, DO  Multiple Vitamins-Minerals (ONE-A-DAY MENS 50+ ADVANTAGE PO) Take 1 tablet by mouth daily.    [provider]  rosuvastatin (CRESTOR) 5 MG tablet Take 1 tablet (5 mg total) by mouth daily. 08/09/17   Mancel Bale, PA-C  sodium bicarbonate 650 MG tablet Take 1 tablet (650 mg total) by mouth 2 (two) times daily. 02/11/18   Weber, Sarah L, PA-C  ULORIC 40 MG tablet Take 40 mg by mouth every other day.  11/06/16   [provider]  zolpidem (AMBIEN) 5 MG tablet TAKE 1 OR 2 TABLETS BY MOUTH AT BEDTIME AS NEEDED 12/11/18   [provider]    Past Medical History:  Diagnosis Date  . Cervical disc disease   . Chronic kidney disease   . COLONIC POLYPS, HX OF 06/27/2007  . EXOGENOUS OBESITY 01/30/2010  . GOUT 01/30/2010  .  HYPERCHOLESTEROLEMIA 06/30/2007  . HYPERTENSION 06/27/2007  . LOW BACK PAIN 06/27/2007  . NEPHROLITHIASIS, HX OF 06/27/2007  . OSTEOARTHRITIS 06/27/2007  . SLEEP APNEA, OBSTRUCTIVE, MODERATE 01/27/2009    Past Surgical History:  Procedure Laterality Date  . CARDIAC CATHETERIZATION    . JOINT REPLACEMENT Left 2005   knee  . LUMBAR LAMINECTOMY    . RADIOFREQUENCY ABLATION KIDNEY    . TOTAL KNEE ARTHROPLASTY     left    Social History   Tobacco Use  . Smoking status: Former Smoker    Last attempt to quit: 07/23/1980    Years since quitting: 38.4  . Smokeless tobacco: Never Used  Substance Use Topics  . Alcohol use: Yes    Alcohol/week: 1.0 standard drinks    Types: 1 Cans of beer per week    Comment: 5/mo    Family History  Problem Relation Age of Onset  . Hypertension Other   . Diabetes Sister   . Hyperlipidemia Sister   . Sleep apnea Sister     ROS Per hpi  Objective  Vitals as reported by the patient: none   ASSESSMENT and PLAN  1. Restless legs History very suggestive of RLS, trial of requip at bedtime (wo ambien or bzd). Reviewed r/se/b. Discussed concerns for daily use of bzd.  - ALPRAZolam (XANAX) 0.5 MG tablet; Take 0.5-1 tablets (0.25-0.5 mg total) by mouth at bedtime as needed for anxiety or sleep.  2. Insomnia, unspecified type  3. Cellulitis of leg, right Patient to start extra week of abx per Dr Vonna Kotyk plan. RTC precautions given.  4. Stage 5 chronic kidney disease not on chronic dialysis [facility]) Plans for initiation of HD, non emergent.  Other orders - zolpidem (AMBIEN) 5 MG tablet; TAKE 1 OR 2 TABLETS BY MOUTH AT BEDTIME AS NEEDED - rOPINIRole (REQUIP) 0.25 MG tablet; Take 1-2 tablets (0.25-0.5 mg total) by mouth at bedtime.  FOLLOW-UP: 6 weeks   The above assessment and management plan was discussed with the patient. The patient verbalized understanding of and has agreed to the management plan. Patient is aware to call the clinic if symptoms  persist or worsen. Patient is aware when to return to the clinic for a follow-up visit. Patient educated on when it is appropriate to go to the emergency department.    I provided 15 minutes of non-face-to-face time during this encounter.  [name], MD Primary Care at [facility], [place] [address] Ph.  [phone] Fax [phone]

## 2018-12-17 NOTE — Progress Notes (Signed)
Following up with hospital stay with fluid on the lungs in Feb. Pt says he is "feeling pretty good these days". Wants to talk about the use of the medication keflex that he has been put bk on for infection. He is also asking for a refill on xanax 0.5mg . Without this medication he is restless and cannot sleep. He was rx [place] a few days ago for sleep, he feels the [place] and the xanax together will help him rest better at night

## 2018-12-22 NOTE — Progress Notes (Signed)
Pt is aware of result

## 2018-12-31 ENCOUNTER — Encounter: Payer: Self-pay | Admitting: Family Medicine

## 2018-12-31 ENCOUNTER — Ambulatory Visit (INDEPENDENT_AMBULATORY_CARE_PROVIDER_SITE_OTHER): Payer: Medicare Other | Admitting: Family Medicine

## 2018-12-31 ENCOUNTER — Other Ambulatory Visit: Payer: Self-pay

## 2018-12-31 VITALS — BP 120/66 | HR 98 | Temp 98.2°F | Resp 16

## 2018-12-31 DIAGNOSIS — M79604 Pain in right leg: Secondary | ICD-10-CM | POA: Diagnosis not present

## 2018-12-31 DIAGNOSIS — M7989 Other specified soft tissue disorders: Secondary | ICD-10-CM

## 2018-12-31 DIAGNOSIS — R609 Edema, unspecified: Secondary | ICD-10-CM

## 2018-12-31 DIAGNOSIS — G4700 Insomnia, unspecified: Secondary | ICD-10-CM

## 2018-12-31 DIAGNOSIS — N185 Chronic kidney disease, stage 5: Secondary | ICD-10-CM | POA: Diagnosis not present

## 2018-12-31 MED ORDER — ZOLPIDEM TARTRATE 5 MG PO TABS: ORAL_TABLET | ORAL | 0 refills | Status: DC

## 2018-12-31 NOTE — Patient Instructions (Addendum)
Based on our exam today, it is less likely there is an infection in your leg.  I suspect with the improvement overnight, this is still just edema or swelling.  However with increased pain and swelling of that right calf, I will order another ultrasound to rule out a blood clot.  I also placed a referral to the vascular specialist to evaluate the recurrent lower extremity issues.   Try to use compression stocking as tolerated in the morning, and may need to temporarily work 1/2 days or elevate legs during the second half of the day to minimize some of the increased swelling and pain.  Follow-up with Dr. Pamella Pert in 2 weeks, sooner if worsening symptoms.  I refilled Ambien temporarily, but that will need to be discussed with Dr. Pamella Pert at follow-up as well.    Return to the clinic or go to the nearest emergency room if any of your symptoms worsen or new symptoms occur.   If you have lab work done today you will be contacted with your lab results within the next 2 weeks.  If you have not heard from [place] then please contact us. The fastest way to get your results is to register for My Chart.   IF you received an x-ray today, you will receive an invoice from [facility]. Please contact [facility] at [phone] with questions or concerns regarding your invoice.   IF you received labwork today, you will receive an invoice from [place]. Please contact [facility] at [phone] with questions or concerns regarding your invoice.   Our billing staff will not be able to assist you with questions regarding bills from these companies.  You will be contacted with the lab results as soon as they are available. The fastest way to get your results is to activate your My Chart account. Instructions are located on the last page of this paperwork. If you have not heard from [place] regarding the results in 2 weeks, please contact this office.

## 2018-12-31 NOTE — Progress Notes (Signed)
Subjective:    Patient ID: Alex Williams, male    DOB: December 24, 1950, 68 y.o.   MRN: [id number]  HPI Alex Williams is a 68 y.o. male Presents today for: Chief Complaint  Patient presents with  . Recurrent Skin Infections    Patient was last seern here 12/12/18 here today for a f/u on cellulitis of the right leg. Patient stated area is worse. Been off abx for a week now.   . Insomnia    patient stated he need a refill on his ambien and need to go up on the dose. Patienrt was informed that he may need to f/u with Dr Pamella Pert to increase dose and refill  PCP: [name], MD  Leg cellulitis: See prior visits.  History of chronic kidney disease, stage V, not on dialysis yet with chronic peripheral edema.  Has had recurrent/persistent lower extremity cellulitis, most recently treated for right lower extremity cellulitis. When he was last seen on May 22, had been treated with 14-day course of doxycycline, 10 days of Keflex.  Renal dosing.  Ultrasound was negative for DVT 5/20 on right.  Was using 120 mg/day of Lasix to help with swelling.  Cellulitis was improving but still some symptoms and was slow improvement, decided to extend antibiotic regimen for 1 additional week through May 29.  Recommended continued leg elevation to help with peripheral edema as well as furosemide and ongoing follow-up with nephrology  Since last visit with me was seen 5/27 with Dr. Pamella Pert  Was taking Lasix 80 mg twice daily at that time.  Had just picked up additional week of prescriptions. 5/27-6/3.   Redness improved, but persisted in R leg. More swelling in R leg over past week. Some swelling on left but significant more swelling in R leg. Some improvement overnight, but ankle swelling worsens at workday - 10-10:30am.  Seated job, props up ankle on trash can.   No CP, dyspnea, or fever.  No DVT in past.    Insomnia Discussed with Dr. Pamella Pert at May 27 visit.  History suggestive of restless leg syndrome,  recommended a trial of Requip 0.25 mg 1 to 2 tablets at bedtime as needed.  Tried requip - 2 pills for 1 week - not helping, and sick on stomach. Stopped after 1 week, still xanax and Ambien at bedtime.  Taking 2 pills recenlty, but has tried to take 1.  (without Ambien or the benzodiazepine), but extended prescription for Xanax at bedtime along with Ambien 5 mg 1-2 at bedtime.  Plan for 6-week follow-up. Controlled substance database (PDMP) reviewed. Last Rx Ambien 5mg  on 5/21 for # 30.   Patient Active Problem List   Diagnosis Date Noted  . Acute on chronic renal failure ([place]) 08/26/2018  . Nonspecific abnormal electrocardiogram (ECG) (EKG) 08/25/2018  . Anemia in CKD (chronic kidney disease) 08/25/2018  . Elevated troponin 08/25/2018  . SOB (shortness of breath) 08/25/2018  . Acute pulmonary edema (HCC)   . Acute renal failure superimposed on stage 5 chronic kidney disease, not on chronic dialysis ([place]) 02/13/2018  . Morbid obesity with BMI of 45.0-49.9, adult ([place]) 12/03/2013  . Renal mass 12/02/2013  . Lower extremity edema 03/16/2013  . CKD (chronic kidney disease), stage III ([place]) 12/10/2011  . GOUT 01/30/2010  . Obstructive sleep apnea 01/27/2009  . HYPERCHOLESTEROLEMIA 06/30/2007  . Essential hypertension 06/27/2007  . Osteoarthritis 06/27/2007  . LOW BACK PAIN 06/27/2007  . History of colonic polyps 06/27/2007  . NEPHROLITHIASIS, HX OF  06/27/2007   Past Medical History:  Diagnosis Date  . Cervical disc disease   . Chronic kidney disease   . COLONIC POLYPS, HX OF 06/27/2007  . EXOGENOUS OBESITY 01/30/2010  . GOUT 01/30/2010  . HYPERCHOLESTEROLEMIA 06/30/2007  . HYPERTENSION 06/27/2007  . LOW BACK PAIN 06/27/2007  . NEPHROLITHIASIS, HX OF 06/27/2007  . OSTEOARTHRITIS 06/27/2007  . SLEEP APNEA, OBSTRUCTIVE, MODERATE 01/27/2009   Past Surgical History:  Procedure Laterality Date  . CARDIAC CATHETERIZATION    . JOINT REPLACEMENT Left 2005   knee  . LUMBAR LAMINECTOMY    .  RADIOFREQUENCY ABLATION KIDNEY    . TOTAL KNEE ARTHROPLASTY     left   Allergies  Allergen Reactions  . Codeine Phosphate    Prior to Admission medications   Medication Sig Start Date End Date Taking? Authorizing Provider  Ferrous Sulfate (IRON) 325 (65 Fe) MG TABS Take 1 tablet by mouth daily at 6 (six) AM.   Yes [provider]  acetaminophen (TYLENOL) 500 MG tablet Take 500 mg by mouth See admin instructions. Takes 1000mg  in the morning and 1000mg  in the evening.    [provider]  ALPRAZolam Duanne Moron) 0.5 MG tablet Take 0.5-1 tablets (0.25-0.5 mg total) by mouth at bedtime as needed for anxiety or sleep. 12/17/18   [name], MD  amLODipine (NORVASC) 10 MG tablet Take 1 tablet (10 mg total) by mouth daily. 02/11/18   Gale Journey, Damaris Hippo, PA-C  calcitRIOL (ROCALTROL) 0.25 MCG capsule  11/26/18   [provider]  calcium-vitamin D 250-100 MG-UNIT tablet Take 1 tablet by mouth 2 (two) times daily.    [provider]  carvedilol (COREG) 25 MG tablet Take 25 mg by mouth daily. 11/22/17   [provider]  cetirizine (ZYRTEC) 10 MG tablet Take 10 mg by mouth daily as needed for allergies.     [provider]  diphenhydramine-acetaminophen (TYLENOL PM) 25-500 MG TABS tablet Take 1 tablet by mouth at bedtime as needed (sleep).    [provider]  fluticasone (FLONASE) 50 MCG/ACT nasal spray Place 2 sprays into both nostrils daily. 09/16/18   [name], MD  furosemide (LASIX) 80 MG tablet Take 1 tablet (80 mg total) by mouth daily. 08/29/18   Mikhail, Velta Addison, DO  Multiple Vitamins-Minerals (ONE-A-DAY MENS 50+ ADVANTAGE PO) Take 1 tablet by mouth daily.    [provider]  rOPINIRole (REQUIP) 0.25 MG tablet Take 1-2 tablets (0.25-0.5 mg total) by mouth at bedtime. 12/17/18   [name], MD  rosuvastatin (CRESTOR) 5 MG tablet Take 1 tablet (5 mg total) by mouth daily. Patient taking differently: Take 5 mg by mouth once.  Every 3 days 08/09/17   Gale Journey, Damaris Hippo, PA-C  sodium bicarbonate 650 MG tablet Take 1 tablet (650 mg total) by mouth 2 (two) times daily. 02/11/18   Weber, Sarah L, PA-C  ULORIC 40 MG tablet Take 40 mg by mouth every other day.  11/06/16   [provider]  zolpidem (AMBIEN) 5 MG tablet TAKE 1 OR 2 TABLETS BY MOUTH AT BEDTIME AS NEEDED 12/11/18   [provider]   Social History   Socioeconomic History  . Marital status: Legally Separated    Spouse name: Not on file  . Number of children: 2  . Years of education: Not on file  . Highest education level: Not on file  Occupational History  . Not on file  Social Needs  . Financial resource strain: Not hard at  all  . Food insecurity:    Worry: Never true    Inability: Never true  . Transportation needs:    Medical: No    Non-medical: No  Tobacco Use  . Smoking status: Former Smoker    Last attempt to quit: 07/23/1980    Years since quitting: 38.4  . Smokeless tobacco: Never Used  Substance and Sexual Activity  . Alcohol use: Yes    Alcohol/week: 1.0 standard drinks    Types: 1 Cans of beer per week    Comment: 5/mo  . Drug use: No  . Sexual activity: Not Currently  Lifestyle  . Physical activity:    Days per week: 2 days    Minutes per session: 20 min  . Stress: Not at all  Relationships  . Social connections:    Talks on phone: Three times a week    Gets together: More than three times a week    Attends religious service: More than 4 times per year    Active member of club or organization: No    Attends meetings of clubs or organizations: Never    Relationship status: Patient refused  . Intimate partner violence:    Fear of current or ex partner: No    Emotionally abused: No    Physically abused: No    Forced sexual activity: No  Other Topics Concern  . Not on file  Social History Narrative   Pt lives alone. He has one son who lives within 3 miles who he usually sees daily. He has two sisters living in  the next town over who he also sees on a weekly basis. He is a religious man who attends church at least 3 Sundays each month.   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     Review of Systems     Objective:   Physical Exam Vitals signs reviewed.  Constitutional:      General: He is not in acute distress.    Appearance: He is well-developed.  HENT:     Head: Normocephalic and atraumatic.  Cardiovascular:     Rate and Rhythm: Normal rate.  Pulmonary:     Effort: Pulmonary effort is normal.  Musculoskeletal:     Right lower leg: Edema (50 vs 46 cm measured 15cm below patella. calf nt,, but ttp just below calf posteriorly. ) present.     Left lower leg: Edema present.  Skin:    General: Skin is warm.     Findings: No erythema (warmth of R greater than left calf, but no appreciable erythema. ).     Comments: No wounds.   Neurological:     Mental Status: He is alert and oriented to person, place, and time.     Vitals:   12/31/18 1423  BP: 120/66  Pulse: 98  Resp: 16  Temp: 98.2 F (36.8 C)  TempSrc: Oral  SpO2: 100%        Assessment & Plan:   Alex Williams is a 68 y.o. male Right leg swelling - Plan: VAS [place] LOWER EXTREMITY VENOUS (DVT), Ambulatory referral to Vascular Surgery Right leg pain Peripheral edema - Plan: VAS [place] LOWER EXTREMITY VENOUS (DVT), Ambulatory referral to Vascular Surgery CKD (chronic kidney disease) stage 5, GFR less than 15 ml/min (HCC)  -Recurrent right lower extremity pain/swelling.  Erythema has decreased.  Given prior antibiotic treatment and course, do not think this is infectious at this time.  Second MD exam was obtained.  Additionally with improvement  of symptoms overnight, then worsening as leg remains dependent after few hours, suspect this is all related to edema or component of lymphedema, complicated by chronic kidney disease.  Considered physical therapy/lymphedema treatment, but with his significant CKD, concern for quick mobilization of  fluid and potential effects.  -With reported acute worsening of swelling and pain will check ultrasound to rule out DVT, but previously was normal/negative, and unlikely.  -Referral placed to vascular surgery to evaluate for potential treatment options/recommendations given his other comorbidities  -Recommend he continue to try compressive stocking in the morning and if swelling/pain with dependent leg by midday, may need to temporarily work half days to allow elevation of leg  -ER/RTC precautions if acute worsening  Insomnia, unspecified type - Plan: zolpidem (AMBIEN) 5 MG tablet  -Did not tolerate Requip.  Agreed to refill Ambien temporarily until he has follow-up within the next 1 month with his primary care provider.  Meds ordered this encounter  Medications  . zolpidem (AMBIEN) 5 MG tablet    Sig: TAKE 1 OR 2 TABLETS BY MOUTH AT BEDTIME AS NEEDED    Dispense:  30 tablet    Refill:  0   Patient Instructions   Based on our exam today, it is less likely there is an infection in your leg.  I suspect with the improvement overnight, this is still just edema or swelling.  However with increased pain and swelling of that right calf, I will order another ultrasound to rule out a blood clot.  I also placed a referral to the vascular specialist to evaluate the recurrent lower extremity issues.   Try to use compression stocking as tolerated in the morning, and may need to temporarily work 1/2 days or elevate legs during the second half of the day to minimize some of the increased swelling and pain.  Follow-up with Dr. Pamella Pert in 2 weeks, sooner if worsening symptoms.  I refilled Ambien temporarily, but that will need to be discussed with Dr. Pamella Pert at follow-up as well.    Return to the clinic or go to the nearest emergency room if any of your symptoms worsen or new symptoms occur.   If you have lab work done today you will be contacted with your lab results within the next 2 weeks.  If you  have not heard from [place] then please contact us. The fastest way to get your results is to register for My Chart.   IF you received an x-ray today, you will receive an invoice from [facility]. Please contact [facility] at [phone] with questions or concerns regarding your invoice.   IF you received labwork today, you will receive an invoice from [place]. Please contact [facility] at [phone] with questions or concerns regarding your invoice.   Our billing staff will not be able to assist you with questions regarding bills from these companies.  You will be contacted with the lab results as soon as they are available. The fastest way to get your results is to activate your My Chart account. Instructions are located on the last page of this paperwork. If you have not heard from [place] regarding the results in 2 weeks, please contact this office.       Signed,   Merri Ray, MD Primary Care at [place].  01/01/19 2:30 PM

## 2019-01-01 ENCOUNTER — Encounter: Payer: Self-pay | Admitting: Family Medicine

## 2019-01-01 ENCOUNTER — Ambulatory Visit (HOSPITAL_COMMUNITY)
Admission: RE | Admit: 2019-01-01 | Discharge: 2019-01-01 | Disposition: A | Discharge status: Home / Self Care | Payer: Medicare Other | Source: Ambulatory Visit | Attending: Family Medicine | Admitting: Family Medicine

## 2019-01-01 DIAGNOSIS — R609 Edema, unspecified: Secondary | ICD-10-CM | POA: Insufficient documentation

## 2019-01-01 DIAGNOSIS — M7989 Other specified soft tissue disorders: Secondary | ICD-10-CM

## 2019-01-03 ENCOUNTER — Other Ambulatory Visit: Payer: Self-pay | Admitting: Family Medicine

## 2019-01-03 DIAGNOSIS — G2581 Restless legs syndrome: Secondary | ICD-10-CM

## 2019-01-05 ENCOUNTER — Other Ambulatory Visit: Payer: Self-pay

## 2019-01-05 DIAGNOSIS — N185 Chronic kidney disease, stage 5: Secondary | ICD-10-CM

## 2019-01-05 NOTE — Telephone Encounter (Signed)
Please advise 

## 2019-01-06 ENCOUNTER — Telehealth (HOSPITAL_COMMUNITY): Payer: Self-pay | Admitting: Rehabilitation

## 2019-01-06 NOTE — Telephone Encounter (Signed)
The above patient or their representative was contacted and gave the following answers to these questions:         Do you have any of the following symptoms? No Fever                    Cough                   Shortness of breath  Do  you have any of the following other symptoms? No  muscle pain         vomiting,        diarrhea        rash         weakness        red eye        abdominal pain         bruising         bleeding              joint pain           severe headache  Have you been in contact with someone who was or has been sick in the past 2 weeks? No Yes                 Unsure                         Unable to assess   Does the person that you were in contact with have any of the following symptoms?  Cough         shortness of breath           muscle pain         vomiting,            diarrhea            rash            weakness           fever            red eye           abdominal pain          bruising  or  bleeding                joint pain                severe headache             Have you  or someone you have been in contact with traveled internationally in the last month?  No      If yes, which countries?  Have you  or someone you have been in contact with traveled outside [place] in the last month?  No      If yes, which state and city?  COMMENTS OR ACTION PLAN FOR THIS PATIENT:    

## 2019-01-07 ENCOUNTER — Ambulatory Visit (HOSPITAL_COMMUNITY)
Admission: RE | Admit: 2019-01-07 | Discharge: 2019-01-07 | Disposition: A | Discharge status: Home / Self Care | Payer: Medicare Other | Source: Ambulatory Visit | Attending: Vascular Surgery | Admitting: Vascular Surgery

## 2019-01-07 ENCOUNTER — Ambulatory Visit: Payer: Medicare Other | Admitting: Vascular Surgery

## 2019-01-07 ENCOUNTER — Ambulatory Visit (INDEPENDENT_AMBULATORY_CARE_PROVIDER_SITE_OTHER)
Admission: RE | Admit: 2019-01-07 | Discharge: 2019-01-07 | Disposition: A | Discharge status: Home / Self Care | Payer: Medicare Other | Source: Ambulatory Visit | Attending: Vascular Surgery | Admitting: Vascular Surgery

## 2019-01-07 ENCOUNTER — Encounter: Payer: Self-pay | Admitting: Vascular Surgery

## 2019-01-07 ENCOUNTER — Other Ambulatory Visit: Payer: Self-pay

## 2019-01-07 VITALS — BP 142/87 | HR 80 | Temp 97.1°F | Resp 20 | Ht 72.25 in | Wt 307.5 lb

## 2019-01-07 DIAGNOSIS — N185 Chronic kidney disease, stage 5: Secondary | ICD-10-CM | POA: Insufficient documentation

## 2019-01-07 NOTE — Progress Notes (Signed)
REASON FOR CONSULT:    To evaluate for hemodialysis access.  The consult is requested by Dr. Vanetta Mulders  ASSESSMENT & PLAN:   [name]: Based on his vein mapping I think his best option for a fistula would be a right brachiocephalic fistula.  As he is not yet on dialysis I have explained that if at all possible we try to avoid placement of the graft. I have explained the indications for placement of an AV fistula or AV graft. I've explained that if at all possible we will place an AV fistula.  I have reviewed the risks of placement of an AV fistula including but not limited to: failure of the fistula to mature, need for subsequent interventions, and thrombosis. In addition I have reviewed the potential complications of placement of an AV graft. These risks include, but are not limited to, graft thrombosis, graft infection, wound healing problems, bleeding, arm swelling, and steal syndrome. All the patient's questions were answered and they are agreeable to proceed with surgery.  Patient also has bilateral lower extremity swelling and I have given him some resources to help control his swelling.  I believe he is scheduled for a formal venous study next week with an office visit.  Deitra Mayo, MD, FACS Beeper [phone] Office: [phone]   HPI:   Alex Williams is a pleasant 68 y.o. male, who was referred for evaluation for hemodialysis access.  He is right-handed.  He is not on dialysis.  He denies any recent uremic symptoms.  Specifically, he denies nausea, vomiting, fatigue, anorexia, or palpitations.  He has had some lower extremity swelling.  He has not had a pacemaker.  He is not on blood thinners.  The patient does have a history of hypertension.  He denies any history of diabetes, hypercholesterolemia, history of myocardial infarction or history of significant congestive heart failure.  There is no family history of premature cardiovascular disease.   He is not a smoker.  Past Medical History:  Diagnosis Date  . Cervical disc disease   . Chronic kidney disease   . COLONIC POLYPS, HX OF 06/27/2007  . EXOGENOUS OBESITY 01/30/2010  . GOUT 01/30/2010  . HYPERCHOLESTEROLEMIA 06/30/2007  . HYPERTENSION 06/27/2007  . LOW BACK PAIN 06/27/2007  . NEPHROLITHIASIS, HX OF 06/27/2007  . OSTEOARTHRITIS 06/27/2007  . SLEEP APNEA, OBSTRUCTIVE, MODERATE 01/27/2009    Family History  Problem Relation Age of Onset  . Hypertension Other   . Diabetes Sister   . Hyperlipidemia Sister   . Sleep apnea Sister     SOCIAL HISTORY: Social History   Socioeconomic History  . Marital status: Legally Separated    Spouse name: Not on file  . Number of children: 2  . Years of education: Not on file  . Highest education level: Not on file  Occupational History  . Not on file  Social Needs  . Financial resource strain: Not hard at all  . Food insecurity    Worry: Never true    Inability: Never true  . Transportation needs    Medical: No    Non-medical: No  Tobacco Use  . Smoking status: Former Smoker    Quit date: 07/23/1980    Years since quitting: 38.4  . Smokeless tobacco: Never Used  Substance and Sexual Activity  . Alcohol use: Yes    Alcohol/week: 1.0 standard drinks    Types: 1 Cans of beer per week    Comment: 5/mo  . Drug  use: No  . Sexual activity: Not Currently  Lifestyle  . Physical activity    Days per week: 2 days    Minutes per session: 20 min  . Stress: Not at all  Relationships  . Social Herbalist on phone: Three times a week    Gets together: More than three times a week    Attends religious service: More than 4 times per year    Active member of club or organization: No    Attends meetings of clubs or organizations: Never    Relationship status: Patient refused  . Intimate partner violence    Fear of current or ex partner: No    Emotionally abused: No    Physically abused: No    Forced sexual activity: No   Other Topics Concern  . Not on file  Social History Narrative   Pt lives alone. He has one son who lives within 3 miles who he usually sees daily. He has two sisters living in the next town over who he also sees on a weekly basis. He is a religious man who attends church at least 3 Sundays each month.   He operates [employer] full time.     Allergies  Allergen Reactions  . Codeine Phosphate     Current Outpatient Medications  Medication Sig Dispense Refill  . acetaminophen (TYLENOL) 500 MG tablet Take 500 mg by mouth See admin instructions. Takes 1000mg  in the morning and 1000mg  in the evening.    Marland Kitchen ALPRAZolam (XANAX) 0.5 MG tablet TAKE 1/2 TO 1 TABLET AT BEDTIME AS NEEDED FOR ANXIETY OR SLEEP 15 tablet 0  . amLODipine (NORVASC) 10 MG tablet Take 1 tablet (10 mg total) by mouth daily. 90 tablet 3  . calcitRIOL (ROCALTROL) 0.25 MCG capsule     . calcium-vitamin D 250-100 MG-UNIT tablet Take 1 tablet by mouth 2 (two) times daily.    . carvedilol (COREG) 25 MG tablet Take 25 mg by mouth daily.  3  . cetirizine (ZYRTEC) 10 MG tablet Take 10 mg by mouth daily as needed for allergies.     . diphenhydramine-acetaminophen (TYLENOL PM) 25-500 MG TABS tablet Take 1 tablet by mouth at bedtime as needed (sleep).    . Ferrous Sulfate (IRON) 325 (65 Fe) MG TABS Take 1 tablet by mouth daily at 6 (six) AM.    . fluticasone (FLONASE) 50 MCG/ACT nasal spray Place 2 sprays into both nostrils daily. 16 g 6  . furosemide (LASIX) 80 MG tablet Take 1 tablet (80 mg total) by mouth daily. 30 tablet 0  . Multiple Vitamins-Minerals (ONE-A-DAY MENS 50+ ADVANTAGE PO) Take 1 tablet by mouth daily.    Marland Kitchen rOPINIRole (REQUIP) 0.25 MG tablet Take 1-2 tablets (0.25-0.5 mg total) by mouth at bedtime. 60 tablet 0  . rosuvastatin (CRESTOR) 5 MG tablet Take 1 tablet (5 mg total) by mouth daily. (Patient taking differently: Take 5 mg by mouth once. Every 3 days) 90 tablet 2  . sodium bicarbonate 650 MG tablet Take 1 tablet (650  mg total) by mouth 2 (two) times daily. 180 tablet 4  . ULORIC 40 MG tablet Take 40 mg by mouth every other day.   6  . zolpidem (AMBIEN) 5 MG tablet TAKE 1 OR 2 TABLETS BY MOUTH AT BEDTIME AS NEEDED 30 tablet 0   No current facility-administered medications for this visit.     REVIEW OF SYSTEMS:  [X]  denotes positive finding, [ ]  denotes negative finding  Cardiac  Comments:  Chest pain or chest pressure:    Shortness of breath upon exertion:    Short of breath when lying flat:    Irregular heart rhythm:        Vascular    Pain in calf, thigh, or hip brought on by ambulation:    Pain in feet at night that wakes you up from your sleep:     Blood clot in your veins:    Leg swelling:  x       Pulmonary    Oxygen at home:    Productive cough:     Wheezing:         Neurologic    Sudden weakness in arms or legs:     Sudden numbness in arms or legs:     Sudden onset of difficulty speaking or slurred speech:    Temporary loss of vision in one eye:     Problems with dizziness:         Gastrointestinal    Blood in stool:     Vomited blood:         Genitourinary    Burning when urinating:     Blood in urine:        Psychiatric    Major depression:         Hematologic    Bleeding problems:    Problems with blood clotting too easily:        Skin    Rashes or ulcers:        Constitutional    Fever or chills:     PHYSICAL EXAM:   Vitals:   01/07/19 1504  BP: (!) 142/87  Pulse: 80  Resp: 20  Temp: (!) 97.1 F (36.2 C)  SpO2: 100%  Weight: (!) 307 lb 8 oz (139.5 kg)  Height: 6' 0.25" (1.835 m)    GENERAL: The patient is a well-nourished male, in no acute distress. The vital signs are documented above. CARDIAC: There is a regular rate and rhythm.  VASCULAR: I do not detect carotid bruits. He is palpable brachial and radial pulses bilaterally. He has bilateral lower extremity swelling. PULMONARY: There is good air exchange bilaterally without wheezing or rales.  ABDOMEN: Soft and non-tender with normal pitched bowel sounds.  MUSCULOSKELETAL: There are no major deformities or cyanosis. NEUROLOGIC: No focal weakness or paresthesias are detected. SKIN: There are no ulcers or rashes noted. PSYCHIATRIC: The patient has a normal affect.  DATA:    BILATERAL UPPER EXTREMITY VEIN MAP: I have independently interpreted his upper extremity vein map.  On the right side the forearm cephalic vein looks small.  The upper arm cephalic vein looks reasonable in size.  The basilic vein looks reasonable in size.  On the left side the forearm cephalic vein looks small.  The upper arm cephalic vein looks marginal in size.  The basilic vein on the left looks reasonable in size.  UPPER EXTREMITY ARTERIAL DUPLEX: I have independently interpreted his upper extremity arterial duplex scan.  On the right side there is a triphasic radial and triphasic ulnar signal with the Doppler.  The right brachial artery measures 0.63 cm in diameter.  On the left side there is a triphasic radial and triphasic ulnar waveform.  The left brachial artery measures 0.59 cm in diameter.  LABS: I have reviewed the patient's labs from 12/05/2018.  GFR was 5.

## 2019-01-07 NOTE — Telephone Encounter (Signed)
Please Advise  Patient is requesting a refill of the following medications: Requested Prescriptions   Pending Prescriptions Disp Refills  . ALPRAZolam (XANAX) 0.5 MG tablet [Pharmacy Med Name: ALPRAZOLAM 0.5 MG TABLET] 15 tablet 0    Sig: TAKE 1/2 TO 1 TABLET AT BEDTIME AS NEEDED FOR ANXIETY OR SLEEP    Date of patient request: 01/03/19 Last office visit:12/31/18 Date of last refill: 12/17/18 Last refill amount: 15 tab Follow up time period per chart: 01/16/19

## 2019-01-07 NOTE — H&P (View-Only) (Signed)
REASON FOR CONSULT:    To evaluate for hemodialysis access.  The consult is requested by Dr. Vanetta Mulders  ASSESSMENT & PLAN:   [place]: Based on his vein mapping I think his best option for a fistula would be a right brachiocephalic fistula.  As he is not yet on dialysis I have explained that if at all possible we try to avoid placement of the graft. I have explained the indications for placement of an AV fistula or AV graft. I've explained that if at all possible we will place an AV fistula.  I have reviewed the risks of placement of an AV fistula including but not limited to: failure of the fistula to mature, need for subsequent interventions, and thrombosis. In addition I have reviewed the potential complications of placement of an AV graft. These risks include, but are not limited to, graft thrombosis, graft infection, wound healing problems, bleeding, arm swelling, and steal syndrome. All the patient's questions were answered and they are agreeable to proceed with surgery.  Patient also has bilateral lower extremity swelling and I have given him some resources to help control his swelling.  I believe he is scheduled for a formal venous study next week with an office visit.  Deitra Mayo, MD, FACS Beeper [phone] Office: [phone]   HPI:   Alex Williams is a pleasant 68 y.o. male, who was referred for evaluation for hemodialysis access.  He is right-handed.  He is not on dialysis.  He denies any recent uremic symptoms.  Specifically, he denies nausea, vomiting, fatigue, anorexia, or palpitations.  He has had some lower extremity swelling.  He has not had a pacemaker.  He is not on blood thinners.  The patient does have a history of hypertension.  He denies any history of diabetes, hypercholesterolemia, history of myocardial infarction or history of significant congestive heart failure.  There is no family history of premature cardiovascular disease.   He is not a smoker.  Past Medical History:  Diagnosis Date  . Cervical disc disease   . Chronic kidney disease   . COLONIC POLYPS, HX OF 06/27/2007  . EXOGENOUS OBESITY 01/30/2010  . GOUT 01/30/2010  . HYPERCHOLESTEROLEMIA 06/30/2007  . HYPERTENSION 06/27/2007  . LOW BACK PAIN 06/27/2007  . NEPHROLITHIASIS, HX OF 06/27/2007  . OSTEOARTHRITIS 06/27/2007  . SLEEP APNEA, OBSTRUCTIVE, MODERATE 01/27/2009    Family History  Problem Relation Age of Onset  . Hypertension Other   . Diabetes Sister   . Hyperlipidemia Sister   . Sleep apnea Sister     SOCIAL HISTORY: Social History   Socioeconomic History  . Marital status: Legally Separated    Spouse name: Not on file  . Number of children: 2  . Years of education: Not on file  . Highest education level: Not on file  Occupational History  . Not on file  Social Needs  . Financial resource strain: Not hard at all  . Food insecurity    Worry: Never true    Inability: Never true  . Transportation needs    Medical: No    Non-medical: No  Tobacco Use  . Smoking status: Former Smoker    Quit date: 07/23/1980    Years since quitting: 38.4  . Smokeless tobacco: Never Used  Substance and Sexual Activity  . Alcohol use: Yes    Alcohol/week: 1.0 standard drinks    Types: 1 Cans of beer per week    Comment: 5/mo  . Drug  use: No  . Sexual activity: Not Currently  Lifestyle  . Physical activity    Days per week: 2 days    Minutes per session: 20 min  . Stress: Not at all  Relationships  . Social Herbalist on phone: Three times a week    Gets together: More than three times a week    Attends religious service: More than 4 times per year    Active member of club or organization: No    Attends meetings of clubs or organizations: Never    Relationship status: Patient refused  . Intimate partner violence    Fear of current or ex partner: No    Emotionally abused: No    Physically abused: No    Forced sexual activity: No   Other Topics Concern  . Not on file  Social History Narrative   Pt lives alone. He has one son who lives within 3 miles who he usually sees daily. He has two sisters living in the next town over who he also sees on a weekly basis. He is a religious man who attends church at least 3 Sundays each month.   He operates [employer] full time.     Allergies  Allergen Reactions  . Codeine Phosphate     Current Outpatient Medications  Medication Sig Dispense Refill  . acetaminophen (TYLENOL) 500 MG tablet Take 500 mg by mouth See admin instructions. Takes 1000mg  in the morning and 1000mg  in the evening.    Marland Kitchen ALPRAZolam (XANAX) 0.5 MG tablet TAKE 1/2 TO 1 TABLET AT BEDTIME AS NEEDED FOR ANXIETY OR SLEEP 15 tablet 0  . amLODipine (NORVASC) 10 MG tablet Take 1 tablet (10 mg total) by mouth daily. 90 tablet 3  . calcitRIOL (ROCALTROL) 0.25 MCG capsule     . calcium-vitamin D 250-100 MG-UNIT tablet Take 1 tablet by mouth 2 (two) times daily.    . carvedilol (COREG) 25 MG tablet Take 25 mg by mouth daily.  3  . cetirizine (ZYRTEC) 10 MG tablet Take 10 mg by mouth daily as needed for allergies.     . diphenhydramine-acetaminophen (TYLENOL PM) 25-500 MG TABS tablet Take 1 tablet by mouth at bedtime as needed (sleep).    . Ferrous Sulfate (IRON) 325 (65 Fe) MG TABS Take 1 tablet by mouth daily at 6 (six) AM.    . fluticasone (FLONASE) 50 MCG/ACT nasal spray Place 2 sprays into both nostrils daily. 16 g 6  . furosemide (LASIX) 80 MG tablet Take 1 tablet (80 mg total) by mouth daily. 30 tablet 0  . Multiple Vitamins-Minerals (ONE-A-DAY MENS 50+ ADVANTAGE PO) Take 1 tablet by mouth daily.    Marland Kitchen rOPINIRole (REQUIP) 0.25 MG tablet Take 1-2 tablets (0.25-0.5 mg total) by mouth at bedtime. 60 tablet 0  . rosuvastatin (CRESTOR) 5 MG tablet Take 1 tablet (5 mg total) by mouth daily. (Patient taking differently: Take 5 mg by mouth once. Every 3 days) 90 tablet 2  . sodium bicarbonate 650 MG tablet Take 1 tablet (650  mg total) by mouth 2 (two) times daily. 180 tablet 4  . ULORIC 40 MG tablet Take 40 mg by mouth every other day.   6  . zolpidem (AMBIEN) 5 MG tablet TAKE 1 OR 2 TABLETS BY MOUTH AT BEDTIME AS NEEDED 30 tablet 0   No current facility-administered medications for this visit.     REVIEW OF SYSTEMS:  [X]  denotes positive finding, [ ]  denotes negative finding  Cardiac  Comments:  Chest pain or chest pressure:    Shortness of breath upon exertion:    Short of breath when lying flat:    Irregular heart rhythm:        Vascular    Pain in calf, thigh, or hip brought on by ambulation:    Pain in feet at night that wakes you up from your sleep:     Blood clot in your veins:    Leg swelling:  x       Pulmonary    Oxygen at home:    Productive cough:     Wheezing:         Neurologic    Sudden weakness in arms or legs:     Sudden numbness in arms or legs:     Sudden onset of difficulty speaking or slurred speech:    Temporary loss of vision in one eye:     Problems with dizziness:         Gastrointestinal    Blood in stool:     Vomited blood:         Genitourinary    Burning when urinating:     Blood in urine:        Psychiatric    Major depression:         Hematologic    Bleeding problems:    Problems with blood clotting too easily:        Skin    Rashes or ulcers:        Constitutional    Fever or chills:     PHYSICAL EXAM:   Vitals:   01/07/19 1504  BP: (!) 142/87  Pulse: 80  Resp: 20  Temp: (!) 97.1 F (36.2 C)  SpO2: 100%  Weight: (!) 307 lb 8 oz (139.5 kg)  Height: 6' 0.25" (1.835 m)    GENERAL: The patient is a well-nourished male, in no acute distress. The vital signs are documented above. CARDIAC: There is a regular rate and rhythm.  VASCULAR: I do not detect carotid bruits. He is palpable brachial and radial pulses bilaterally. He has bilateral lower extremity swelling. PULMONARY: There is good air exchange bilaterally without wheezing or rales.  ABDOMEN: Soft and non-tender with normal pitched bowel sounds.  MUSCULOSKELETAL: There are no major deformities or cyanosis. NEUROLOGIC: No focal weakness or paresthesias are detected. SKIN: There are no ulcers or rashes noted. PSYCHIATRIC: The patient has a normal affect.  DATA:    BILATERAL UPPER EXTREMITY VEIN MAP: I have independently interpreted his upper extremity vein map.  On the right side the forearm cephalic vein looks small.  The upper arm cephalic vein looks reasonable in size.  The basilic vein looks reasonable in size.  On the left side the forearm cephalic vein looks small.  The upper arm cephalic vein looks marginal in size.  The basilic vein on the left looks reasonable in size.  UPPER EXTREMITY ARTERIAL DUPLEX: I have independently interpreted his upper extremity arterial duplex scan.  On the right side there is a triphasic radial and triphasic ulnar signal with the Doppler.  The right brachial artery measures 0.63 cm in diameter.  On the left side there is a triphasic radial and triphasic ulnar waveform.  The left brachial artery measures 0.59 cm in diameter.  LABS: I have reviewed the patient's labs from 12/05/2018.  GFR was 5.

## 2019-01-07 NOTE — Telephone Encounter (Signed)
pmp reviewd, appropriate meds refilled 

## 2019-01-11 ENCOUNTER — Other Ambulatory Visit: Payer: Self-pay | Admitting: Family Medicine

## 2019-01-12 ENCOUNTER — Other Ambulatory Visit: Payer: Self-pay

## 2019-01-12 DIAGNOSIS — M7989 Other specified soft tissue disorders: Secondary | ICD-10-CM

## 2019-01-14 ENCOUNTER — Telehealth (HOSPITAL_COMMUNITY): Payer: Self-pay | Admitting: Rehabilitation

## 2019-01-14 NOTE — Telephone Encounter (Signed)

## 2019-01-15 ENCOUNTER — Ambulatory Visit (INDEPENDENT_AMBULATORY_CARE_PROVIDER_SITE_OTHER): Payer: Medicare Other | Admitting: Vascular Surgery

## 2019-01-15 ENCOUNTER — Encounter: Payer: Self-pay | Admitting: Vascular Surgery

## 2019-01-15 ENCOUNTER — Other Ambulatory Visit: Payer: Self-pay

## 2019-01-15 ENCOUNTER — Ambulatory Visit (HOSPITAL_COMMUNITY)
Admission: RE | Admit: 2019-01-15 | Discharge: 2019-01-15 | Disposition: A | Discharge status: Home / Self Care | Payer: Medicare Other | Source: Ambulatory Visit | Attending: Vascular Surgery | Admitting: Vascular Surgery

## 2019-01-15 ENCOUNTER — Other Ambulatory Visit (HOSPITAL_COMMUNITY)
Admission: RE | Admit: 2019-01-15 | Discharge: 2019-01-15 | Disposition: A | Discharge status: Home / Self Care | Payer: Medicare Other | Source: Ambulatory Visit | Attending: Vascular Surgery | Admitting: Vascular Surgery

## 2019-01-15 VITALS — BP 150/84 | HR 91 | Temp 98.7°F | Resp 18 | Ht 72.0 in | Wt 311.0 lb

## 2019-01-15 DIAGNOSIS — M7989 Other specified soft tissue disorders: Secondary | ICD-10-CM

## 2019-01-15 DIAGNOSIS — Z1159 Encounter for screening for other viral diseases: Secondary | ICD-10-CM | POA: Diagnosis not present

## 2019-01-15 DIAGNOSIS — I872 Venous insufficiency (chronic) (peripheral): Secondary | ICD-10-CM | POA: Diagnosis not present

## 2019-01-15 LAB — SARS CORONAVIRUS 2 (TAT 6-24 HRS): SARS Coronavirus 2: NEGATIVE

## 2019-01-15 NOTE — Progress Notes (Signed)
REASON FOR CONSULT:    Leg swelling.  The consult is requested by Dr. Merri Ray.   ASSESSMENT & PLAN:   CHRONIC VENOUS INSUFFICIENCY: Based on his physical exam I think the patient does have evidence of chronic venous insufficiency.  He has hyperpigmentation consistent with CEAP C4a venous disease.  He does not have any significant superficial venous reflux.  He does have some chronic phlebitis in the right small saphenous vein.  We have discussed the importance of intermittent leg elevation and the proper positioning for this.  In addition I have written a prescription for knee-high compression stockings with a gradient of 15 to 20 mmHg.  Because of his size it is difficult for him to get on the stockings and I think a milder gradient will be easier for him and more practical for him to wear daily.  I encouraged him to avoid prolonged sitting and standing although this will be difficult for him at work as he is mostly sitting with some moving around in the shop.  I discussed the importance of exercise specifically walking and water aerobics.  In addition we discussed weight management.  I explained that the central obesity especially increases venous pressure in the lower extremities.  I do not see any evidence of significant lymphedema that would warrant a more aggressive work-up.  I think this mostly represents chronic venous insufficiency.   Deitra Mayo, MD, FACS Beeper [phone] Office: [phone]   HPI:   Alex Williams is a pleasant 68 y.o. male, who we have followed with stage V chronic kidney disease.  He was recently evaluated for hemodialysis access and based on his vein map it was felt that he was a candidate for a right brachiocephalic fistula.  He has stage V chronic kidney disease.  I reviewed approximately 12 pages of notes from the referring office.  The patient was seen on 07/01/2018 and is followed with chronic kidney disease.  At the time of that visit the kidney  disease was stable.  Patient has hypertension which is been under reasonable control.  The patient also has a history of anemia secondary to chronic kidney disease.  Labs at that time showed a GFR of 10.  The most recent visit was on 07/05/2019.  Based on the labs at that time I would say the patient has stage V chronic kidney disease.  Patient has secondary hyperparathyroidism and gout also documented.  On 12/11/2018 subsequent follow-up visit documents some swelling.  Patient tells me he has had swelling for 1 month.  This came on gradually and is more significant on the right side.  He denies any history of congestive heart failure.  He denies any previous history of DVT.  He did have a venous study on 01/01/2019 which showed no evidence of DVT bilaterally.  He was brought in for a formal reflux study today.  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  I do not get any history of claudication although I think his activity is likely limited.  Past Medical History:  Diagnosis Date  . Cervical disc disease   . Chronic kidney disease   . COLONIC POLYPS, HX OF 06/27/2007  . EXOGENOUS OBESITY 01/30/2010  . GOUT 01/30/2010  . HYPERCHOLESTEROLEMIA 06/30/2007  . HYPERTENSION 06/27/2007  . LOW BACK PAIN 06/27/2007  . NEPHROLITHIASIS, HX OF 06/27/2007  . OSTEOARTHRITIS 06/27/2007  . 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 Hypertension Other   . Diabetes Sister   . Hyperlipidemia Sister   . Sleep apnea Sister     SOCIAL HISTORY: Social History   Socioeconomic History  . Marital status: Legally Separated    Spouse name: Not on file  . Number of children: 2  . Years of education: Not on file  . Highest education level: Not on file  Occupational History  . Not on file  Social Needs  . Financial resource strain: Not hard at all  . Food insecurity    Worry: Never  true    Inability: Never true  . Transportation needs    Medical: No    Non-medical: No  Tobacco Use  . Smoking status: Former Smoker    Quit date: 07/23/1980    Years since quitting: 38.5  . Smokeless tobacco: Never Used  Substance and Sexual Activity  . Alcohol use: Yes    Alcohol/week: 1.0 standard drinks    Types: 1 Cans of beer per week    Comment: 5/mo  . Drug use: No  . Sexual activity: Not Currently  Lifestyle  . Physical activity    Days per week: 2 days    Minutes per session: 20 min  . Stress: Not at all  Relationships  . Social Herbalist on phone: Three times a week    Gets together: More than three times a week    Attends religious service: More than 4 times per year    Active member of club or organization: No    Attends meetings of clubs or organizations: Never    Relationship status: Patient refused  . Intimate partner violence    Fear of current or ex partner: No    Emotionally abused: No    Physically abused: No    Forced sexual activity: No  Other Topics Concern  . Not on file  Social History Narrative   Pt lives alone. He has one son who lives within 3 miles who he usually sees daily. He has two sisters living in the next town over who he also sees on a weekly basis. He is a religious man who attends church at least 3 Sundays each month.   He operates [employer] full time.     Allergies  Allergen Reactions  . Codeine Phosphate Other (See Comments)    Hyperactive     Current Outpatient Medications  Medication Sig Dispense Refill  . acetaminophen (TYLENOL) 500 MG tablet Take 1,000 mg by mouth 2 (two) times a day. Takes 1000mg  in the morning and 1000mg  in the evening.     Marland Kitchen amLODipine (NORVASC) 10 MG tablet Take 1 tablet (10 mg total) by mouth daily. 90 tablet 3  . CALCIUM CITRATE-VITAMIN D PO Take 1 tablet by mouth daily.     . carvedilol (COREG) 25 MG tablet Take 25 mg by mouth every morning.   3  . cetirizine (ZYRTEC) 10 MG tablet  Take 10 mg by mouth daily as needed for allergies.     . diphenhydramine-acetaminophen (TYLENOL PM) 25-500 MG TABS tablet Take 1 tablet by mouth at bedtime as needed (sleep).    . Ferrous Sulfate (IRON) 325 (65 Fe) MG TABS Take 1 tablet by mouth daily at 6 (six) AM.    . furosemide (LASIX) 80 MG tablet Take 1 tablet (80 mg total) by mouth daily. 30 tablet 0  .  Multiple Vitamins-Minerals (ONE-A-DAY MENS 50+ ADVANTAGE PO) Take 1 tablet by mouth daily.    Marland Kitchen rOPINIRole (REQUIP) 0.25 MG tablet TAKE 1 TO 2 TABLETS AT BEDTIME (Patient taking differently: Take 0.25-0.5 mg by mouth at bedtime. ) 60 tablet 0  . rosuvastatin (CRESTOR) 5 MG tablet Take 1 tablet (5 mg total) by mouth daily. (Patient taking differently: Take 5 mg by mouth See admin instructions. Every 3 days) 90 tablet 2  . sodium bicarbonate 650 MG tablet Take 1 tablet (650 mg total) by mouth 2 (two) times daily. (Patient taking differently: Take 1,300 mg by mouth 2 (two) times daily. ) 180 tablet 4  . ULORIC 40 MG tablet Take 40 mg by mouth every other day.   6  . ALPRAZolam (XANAX) 0.5 MG tablet TAKE 1/2 TO 1 TABLET AT BEDTIME AS NEEDED FOR ANXIETY OR SLEEP (Patient not taking: No sig reported) 15 tablet 0  . fluticasone (FLONASE) 50 MCG/ACT nasal spray Place 2 sprays into both nostrils daily. (Patient not taking: Reported on 01/15/2019) 16 g 6  . zolpidem (AMBIEN) 5 MG tablet TAKE 1 OR 2 TABLETS BY MOUTH AT BEDTIME AS NEEDED (Patient not taking: Reported on 01/12/2019) 30 tablet 0   No current facility-administered medications for this visit.     REVIEW OF SYSTEMS:  [X]  denotes positive finding, [ ]  denotes negative finding Cardiac  Comments:  Chest pain or chest pressure:    Shortness of breath upon exertion:    Short of breath when lying flat:    Irregular heart rhythm:        Vascular    Pain in calf, thigh, or hip brought on by ambulation:    Pain in feet at night that wakes you up from your sleep:     Blood clot in your veins:     Leg swelling:  x       Pulmonary    Oxygen at home:    Productive cough:     Wheezing:         Neurologic    Sudden weakness in arms or legs:     Sudden numbness in arms or legs:     Sudden onset of difficulty speaking or slurred speech:    Temporary loss of vision in one eye:     Problems with dizziness:         Gastrointestinal    Blood in stool:     Vomited blood:         Genitourinary    Burning when urinating:     Blood in urine:        Psychiatric    Major depression:         Hematologic    Bleeding problems:    Problems with blood clotting too easily:        Skin    Rashes or ulcers:        Constitutional    Fever or chills:     PHYSICAL EXAM:   Vitals:   01/15/19 1503  BP: (!) 150/84  Pulse: 91  Resp: 18  Temp: 98.7 F (37.1 C)  TempSrc: Temporal  SpO2: 99%  Weight: (!) 311 lb (141.1 kg)  Height: 6' (1.829 m)   Body mass index is 42.18 kg/m.  GENERAL: The patient is a well-nourished male, in no acute distress. The vital signs are documented above. CARDIAC: There is a regular rate and rhythm.  VASCULAR: I do not detect carotid bruits. I can palpate a right  dorsalis pedis pulse.  Otherwise I cannot palpate pedal pulses. However by Doppler he has a biphasic right dorsalis pedis and posterior tibial signal.  He has a biphasic left posterior tibial signal with a monophasic dorsalis pedis signal. He has bilateral lower extremity swelling which is more significant on the right side. He has hyperpigmentation bilaterally.  I do not not see any significant varicose veins or significant telangiectasias or spider veins.       PULMONARY: There is good air exchange bilaterally without wheezing or rales. ABDOMEN: Soft and non-tender with normal pitched bowel sounds.  It is impossible to assess for an aneurysm because of his size. MUSCULOSKELETAL: There are no major deformities or cyanosis. NEUROLOGIC: No focal weakness or paresthesias are detected. SKIN:  There are no ulcers or rashes noted. PSYCHIATRIC: The patient has a normal affect.  DATA:    VENOUS REFLUX STUDY: I have independently interpreted his venous reflux study today.  On the right side he has no evidence of DVT.  He has no evidence of deep venous reflux or superficial venous reflux.  He does have some chronic thrombus in the right small saphenous vein.  On the left side he has no evidence of DVT.  He has no evidence of deep venous reflux or superficial venous reflux on the left.

## 2019-01-16 ENCOUNTER — Telehealth: Payer: Self-pay | Admitting: Family Medicine

## 2019-01-16 ENCOUNTER — Encounter: Payer: Medicare Other | Admitting: Family Medicine

## 2019-01-16 ENCOUNTER — Encounter: Payer: Self-pay | Admitting: Family Medicine

## 2019-01-16 ENCOUNTER — Other Ambulatory Visit: Payer: Self-pay

## 2019-01-16 ENCOUNTER — Encounter (HOSPITAL_COMMUNITY): Payer: Self-pay | Admitting: *Deleted

## 2019-01-16 MED ORDER — DEXTROSE 5 % IV SOLN
3.0000 g | INTRAVENOUS | Status: AC
  Administered 2019-01-19: 3 g via INTRAVENOUS
  Filled 2019-01-16: qty 3

## 2019-01-16 MED ORDER — TERAZOSIN HCL 1 MG PO CAPS: ORAL_CAPSULE | ORAL | 0 refills | Status: DC

## 2019-01-16 NOTE — Progress Notes (Signed)
Pt denies SOB, chest pain, and being under the care of a cardiologist. Pt made aware to stop taking  vitamins, fish oil and herbal medications. Do not take any NSAIDs ie: Ibuprofen, Advil, Naproxen (Aleve), Motrin, BC and Goody Powder.  Pt denies that he and family members tested positive for COVID-19 ( pt tested negative and reminded to quarantine).  Pt denies that she and family members experienced the following symptoms:  Cough yes/no: No Fever (>100.20F)  yes/no: No Runny nose yes/no: No Sore throat yes/no: No Difficulty breathing/shortness of breath  yes/no: No  Have you or a family member traveled in the last 14 days and where? yes/no: No   Pt reminded that hospital visitation restrictions are in effect and the importance of the restrictions.  Pt verbalized understanding of all pre-op instructions.

## 2019-01-16 NOTE — Telephone Encounter (Signed)
Spoke with patient, Left wo being seen Patient was informed about wait time  Patient saw vasc surg yesterday Venous insufficiency, 15-20 mmHg compression stockings No DVT  Patient states legs are better Sees renal next week  Not sleeping due to nocturia 4-6 x a night Clear urine, no dysuria Takes lasix am and noon Uses cpap, no snoring  Started terazosin 1-2 mg at bedtime Reviewed r/se/b Patient to call in 2 weeks to see how he is doing Titrate medication to effect  Patient instructed to call for folllowup appt in 3 months

## 2019-01-18 NOTE — Anesthesia Preprocedure Evaluation (Addendum)
Anesthesia Evaluation  Patient identified by MRN, date of birth, ID band Patient awake    Reviewed: Allergy & Precautions, NPO status , Patient's Chart, lab work & pertinent test results, reviewed documented beta blocker date and time   History of Anesthesia Complications Negative for: history of anesthetic complications  Airway Mallampati: II  TM Distance: >3 FB Neck ROM: Full    Dental  (+) Poor Dentition, Chipped, Missing, Dental Advisory Given   Pulmonary sleep apnea and Continuous Positive Airway Pressure Ventilation , former smoker,  01/15/2019 SARS coronavirus NEG   breath sounds clear to auscultation       Cardiovascular hypertension, Pt. on medications and Pt. on home beta blockers (-) angina Rhythm:Regular Rate:Normal  '11 ECHO: EF 60-65%, valves OK   Neuro/Psych negative neurological ROS     GI/Hepatic Neg liver ROS, GERD  Controlled and Medicated,  Endo/Other  Morbid obesity  Renal/GU Renal InsufficiencyRenal disease (not on dialysis yet, K+ 4.9)     Musculoskeletal  (+) Arthritis ,   Abdominal (+) + obese,   Peds  Hematology  (+) Blood dyscrasia (Hb 6.8), anemia ,   Anesthesia Other Findings   Reproductive/Obstetrics                            Anesthesia Physical Anesthesia Plan  ASA: III  Anesthesia Plan: MAC   Post-op Pain Management:    Induction:   PONV Risk Score and Plan: 1 and Ondansetron and Treatment may vary due to age or medical condition  Airway Management Planned: Simple Face Mask and Natural Airway  Additional Equipment:   Intra-op Plan:   Post-operative Plan:   Informed Consent: I have reviewed the patients History and Physical, chart, labs and discussed the procedure including the risks, benefits and alternatives for the proposed anesthesia with the patient or authorized representative who has indicated his/her understanding and acceptance.      Dental advisory given  Plan Discussed with: CRNA and Surgeon  Anesthesia Plan Comments: (Dr Scot Dock and patient agree with transfusion )      Anesthesia Quick Evaluation

## 2019-01-19 ENCOUNTER — Other Ambulatory Visit: Payer: Self-pay

## 2019-01-19 ENCOUNTER — Ambulatory Visit (HOSPITAL_COMMUNITY): Payer: Medicare Other | Admitting: Anesthesiology

## 2019-01-19 ENCOUNTER — Encounter (HOSPITAL_COMMUNITY): Payer: Self-pay

## 2019-01-19 ENCOUNTER — Encounter (HOSPITAL_COMMUNITY)
Admission: RE | Disposition: A | Discharge status: Home / Self Care | Payer: Self-pay | Source: Home / Self Care | Attending: Vascular Surgery

## 2019-01-19 ENCOUNTER — Ambulatory Visit (HOSPITAL_COMMUNITY)
Admission: RE | Admit: 2019-01-19 | Discharge: 2019-01-19 | Disposition: A | Discharge status: Home / Self Care | Payer: Medicare Other | Attending: Vascular Surgery | Admitting: Vascular Surgery

## 2019-01-19 DIAGNOSIS — D631 Anemia in chronic kidney disease: Secondary | ICD-10-CM | POA: Insufficient documentation

## 2019-01-19 DIAGNOSIS — G4733 Obstructive sleep apnea (adult) (pediatric): Secondary | ICD-10-CM | POA: Insufficient documentation

## 2019-01-19 DIAGNOSIS — Z87891 Personal history of nicotine dependence: Secondary | ICD-10-CM | POA: Insufficient documentation

## 2019-01-19 DIAGNOSIS — Z6841 Body Mass Index (BMI) 40.0 and over, adult: Secondary | ICD-10-CM | POA: Diagnosis not present

## 2019-01-19 DIAGNOSIS — Z7951 Long term (current) use of inhaled steroids: Secondary | ICD-10-CM | POA: Diagnosis not present

## 2019-01-19 DIAGNOSIS — Z79899 Other long term (current) drug therapy: Secondary | ICD-10-CM | POA: Insufficient documentation

## 2019-01-19 DIAGNOSIS — K219 Gastro-esophageal reflux disease without esophagitis: Secondary | ICD-10-CM | POA: Insufficient documentation

## 2019-01-19 DIAGNOSIS — I120 Hypertensive chronic kidney disease with stage 5 chronic kidney disease or end stage renal disease: Secondary | ICD-10-CM | POA: Insufficient documentation

## 2019-01-19 DIAGNOSIS — E7800 Pure hypercholesterolemia, unspecified: Secondary | ICD-10-CM | POA: Insufficient documentation

## 2019-01-19 DIAGNOSIS — N185 Chronic kidney disease, stage 5: Secondary | ICD-10-CM | POA: Insufficient documentation

## 2019-01-19 DIAGNOSIS — M109 Gout, unspecified: Secondary | ICD-10-CM | POA: Insufficient documentation

## 2019-01-19 HISTORY — DX: Presence of dental prosthetic device (complete) (partial): Z97.2

## 2019-01-19 HISTORY — DX: Gastro-esophageal reflux disease without esophagitis: K21.9

## 2019-01-19 HISTORY — PX: AV FISTULA PLACEMENT: SHX1204

## 2019-01-19 LAB — POCT I-STAT 4, (NA,K, GLUC, HGB,HCT)
Glucose, Bld: 93 mg/dL (ref 70–99)
HCT: 20 % — ABNORMAL LOW (ref 39.0–52.0)
Hemoglobin: 6.8 g/dL — CL (ref 13.0–17.0)
Potassium: 4.9 mmol/L (ref 3.5–5.1)
Sodium: 142 mmol/L (ref 135–145)

## 2019-01-19 LAB — ABO/RH: ABO/RH(D): O NEG

## 2019-01-19 LAB — PREPARE RBC (CROSSMATCH)

## 2019-01-19 SURGERY — ARTERIOVENOUS (AV) FISTULA CREATION
Anesthesia: Monitor Anesthesia Care | Site: Arm Upper | Laterality: Right

## 2019-01-19 MED ORDER — LIDOCAINE-EPINEPHRINE (PF) 1 %-1:200000 IJ SOLN
INTRAMUSCULAR | Status: AC
  Filled 2019-01-19: qty 30

## 2019-01-19 MED ORDER — SODIUM CHLORIDE 0.9 % IV SOLN
INTRAVENOUS | Status: DC
  Administered 2019-01-19: 07:00:00 via INTRAVENOUS

## 2019-01-19 MED ORDER — LIDOCAINE 2% (20 MG/ML) 5 ML SYRINGE
INTRAMUSCULAR | Status: AC
  Filled 2019-01-19: qty 5

## 2019-01-19 MED ORDER — PHENYLEPHRINE HCL-NACL 10-0.9 MG/250ML-% IV SOLN
INTRAVENOUS | Status: AC
  Filled 2019-01-19: qty 500

## 2019-01-19 MED ORDER — CHLORHEXIDINE GLUCONATE 4 % EX LIQD: 60.0000 mL | Freq: Once | CUTANEOUS | Status: DC

## 2019-01-19 MED ORDER — MEPERIDINE HCL 25 MG/ML IJ SOLN: 6.2500 mg | INTRAMUSCULAR | Status: DC | PRN

## 2019-01-19 MED ORDER — PHENYLEPHRINE 40 MCG/ML (10ML) SYRINGE FOR IV PUSH (FOR BLOOD PRESSURE SUPPORT)
PREFILLED_SYRINGE | INTRAVENOUS | Status: AC
  Filled 2019-01-19: qty 10

## 2019-01-19 MED ORDER — LIDOCAINE HCL (PF) 1 % IJ SOLN
INTRAMUSCULAR | Status: AC
  Filled 2019-01-19: qty 30

## 2019-01-19 MED ORDER — SODIUM CHLORIDE 0.9% IV SOLUTION: Freq: Once | INTRAVENOUS | Status: DC

## 2019-01-19 MED ORDER — PROTAMINE SULFATE 10 MG/ML IV SOLN
INTRAVENOUS | Status: DC | PRN
  Administered 2019-01-19: 60 mg via INTRAVENOUS

## 2019-01-19 MED ORDER — SODIUM CHLORIDE 0.9 % IV SOLN
INTRAVENOUS | Status: DC | PRN
  Administered 2019-01-19: 25 ug/min via INTRAVENOUS

## 2019-01-19 MED ORDER — HEPARIN SODIUM (PORCINE) 1000 UNIT/ML IJ SOLN
INTRAMUSCULAR | Status: AC
  Filled 2019-01-19: qty 2

## 2019-01-19 MED ORDER — THROMBIN (RECOMBINANT) 20000 UNITS EX SOLR
CUTANEOUS | Status: AC
  Filled 2019-01-19: qty 20000

## 2019-01-19 MED ORDER — FENTANYL CITRATE (PF) 100 MCG/2ML IJ SOLN: 25.0000 ug | INTRAMUSCULAR | Status: DC | PRN

## 2019-01-19 MED ORDER — OXYCODONE HCL 5 MG PO TABS: 5.0000 mg | ORAL_TABLET | ORAL | 0 refills | Status: DC | PRN

## 2019-01-19 MED ORDER — ONDANSETRON HCL 4 MG/2ML IJ SOLN
INTRAMUSCULAR | Status: AC
  Filled 2019-01-19: qty 2

## 2019-01-19 MED ORDER — PROPOFOL 10 MG/ML IV BOLUS
INTRAVENOUS | Status: AC
  Filled 2019-01-19: qty 20

## 2019-01-19 MED ORDER — EPHEDRINE SULFATE-NACL 50-0.9 MG/10ML-% IV SOSY
PREFILLED_SYRINGE | INTRAVENOUS | Status: DC | PRN
  Administered 2019-01-19: 10 mg via INTRAVENOUS
  Administered 2019-01-19: 5 mg via INTRAVENOUS
  Administered 2019-01-19: 10 mg via INTRAVENOUS

## 2019-01-19 MED ORDER — PROTAMINE SULFATE 10 MG/ML IV SOLN
INTRAVENOUS | Status: AC
  Filled 2019-01-19: qty 10

## 2019-01-19 MED ORDER — SODIUM CHLORIDE 0.9 % IV SOLN
INTRAVENOUS | Status: DC | PRN
  Administered 2019-01-19: 500 mL

## 2019-01-19 MED ORDER — PROPOFOL 10 MG/ML IV BOLUS
INTRAVENOUS | Status: DC | PRN
  Administered 2019-01-19 (×4): 20 mg via INTRAVENOUS

## 2019-01-19 MED ORDER — PROPOFOL 500 MG/50ML IV EMUL
INTRAVENOUS | Status: DC | PRN
  Administered 2019-01-19: 75 ug/kg/min via INTRAVENOUS

## 2019-01-19 MED ORDER — MIDAZOLAM HCL 2 MG/2ML IJ SOLN: 0.5000 mg | Freq: Once | INTRAMUSCULAR | Status: DC | PRN

## 2019-01-19 MED ORDER — PHENYLEPHRINE 40 MCG/ML (10ML) SYRINGE FOR IV PUSH (FOR BLOOD PRESSURE SUPPORT)
PREFILLED_SYRINGE | INTRAVENOUS | Status: DC | PRN
  Administered 2019-01-19: 120 ug via INTRAVENOUS
  Administered 2019-01-19: 80 ug via INTRAVENOUS

## 2019-01-19 MED ORDER — HEPARIN SODIUM (PORCINE) 1000 UNIT/ML IJ SOLN
INTRAMUSCULAR | Status: DC | PRN
  Administered 2019-01-19: 12000 [IU] via INTRAVENOUS

## 2019-01-19 MED ORDER — FENTANYL CITRATE (PF) 250 MCG/5ML IJ SOLN
INTRAMUSCULAR | Status: AC
  Filled 2019-01-19: qty 5

## 2019-01-19 MED ORDER — 0.9 % SODIUM CHLORIDE (POUR BTL) OPTIME
TOPICAL | Status: DC | PRN
  Administered 2019-01-19: 1000 mL

## 2019-01-19 MED ORDER — MIDAZOLAM HCL 2 MG/2ML IJ SOLN
INTRAMUSCULAR | Status: AC
  Filled 2019-01-19: qty 2

## 2019-01-19 MED ORDER — LIDOCAINE-EPINEPHRINE (PF) 1 %-1:200000 IJ SOLN
INTRAMUSCULAR | Status: DC | PRN
  Administered 2019-01-19: 14 mL

## 2019-01-19 MED ORDER — LIDOCAINE HCL (CARDIAC) PF 100 MG/5ML IV SOSY
PREFILLED_SYRINGE | INTRAVENOUS | Status: DC | PRN
  Administered 2019-01-19: 60 mg via INTRAVENOUS

## 2019-01-19 MED ORDER — MIDAZOLAM HCL 2 MG/2ML IJ SOLN
INTRAMUSCULAR | Status: DC | PRN
  Administered 2019-01-19 (×2): 1 mg via INTRAVENOUS

## 2019-01-19 MED ORDER — SODIUM CHLORIDE 0.9 % IV SOLN
INTRAVENOUS | Status: AC
  Filled 2019-01-19: qty 1.2

## 2019-01-19 MED ORDER — PROPOFOL 1000 MG/100ML IV EMUL
INTRAVENOUS | Status: AC
  Filled 2019-01-19: qty 200

## 2019-01-19 MED ORDER — EPHEDRINE 5 MG/ML INJ
INTRAVENOUS | Status: AC
  Filled 2019-01-19: qty 10

## 2019-01-19 MED ORDER — PROMETHAZINE HCL 25 MG/ML IJ SOLN: 6.2500 mg | INTRAMUSCULAR | Status: DC | PRN

## 2019-01-19 MED ORDER — FENTANYL CITRATE (PF) 250 MCG/5ML IJ SOLN
INTRAMUSCULAR | Status: DC | PRN
  Administered 2019-01-19: 75 ug via INTRAVENOUS

## 2019-01-19 MED ORDER — ONDANSETRON HCL 4 MG/2ML IJ SOLN
INTRAMUSCULAR | Status: DC | PRN
  Administered 2019-01-19: 4 mg via INTRAVENOUS

## 2019-01-19 SURGICAL SUPPLY — 37 items
ADH SKN CLS APL DERMABOND .7 (GAUZE/BANDAGES/DRESSINGS) ×1
ADH SKN CLS LQ APL DERMABOND (GAUZE/BANDAGES/DRESSINGS) ×1
ARMBAND PINK RESTRICT EXTREMIT (MISCELLANEOUS) ×6 IMPLANT
CANISTER SUCT 3000ML PPV (MISCELLANEOUS) ×3 IMPLANT
CANNULA VESSEL 3MM 2 BLNT TIP (CANNULA) ×3 IMPLANT
CLIP VESOCCLUDE MED 6/CT (CLIP) ×3 IMPLANT
CLIP VESOCCLUDE SM WIDE 6/CT (CLIP) ×3 IMPLANT
COVER PROBE W GEL 5X96 (DRAPES) IMPLANT
COVER WAND RF STERILE (DRAPES) ×3 IMPLANT
DECANTER SPIKE VIAL GLASS SM (MISCELLANEOUS) ×3 IMPLANT
DERMABOND ADHESIVE PROPEN (GAUZE/BANDAGES/DRESSINGS) ×2
DERMABOND ADVANCED (GAUZE/BANDAGES/DRESSINGS) ×2
DERMABOND ADVANCED .7 DNX12 (GAUZE/BANDAGES/DRESSINGS) ×1 IMPLANT
DERMABOND ADVANCED .7 DNX6 (GAUZE/BANDAGES/DRESSINGS) IMPLANT
ELECT REM PT RETURN 9FT ADLT (ELECTROSURGICAL) ×3
ELECTRODE REM PT RTRN 9FT ADLT (ELECTROSURGICAL) ×1 IMPLANT
GLOVE BIO SURGEON STRL SZ7.5 (GLOVE) ×3 IMPLANT
GLOVE BIOGEL PI IND STRL 7.5 (GLOVE) IMPLANT
GLOVE BIOGEL PI IND STRL 8 (GLOVE) ×1 IMPLANT
GLOVE BIOGEL PI INDICATOR 7.5 (GLOVE) ×2
GLOVE BIOGEL PI INDICATOR 8 (GLOVE) ×2
GOWN STRL REUS W/ TWL LRG LVL3 (GOWN DISPOSABLE) ×3 IMPLANT
GOWN STRL REUS W/TWL LRG LVL3 (GOWN DISPOSABLE) ×9
KIT BASIN OR (CUSTOM PROCEDURE TRAY) ×3 IMPLANT
KIT TURNOVER KIT B (KITS) ×3 IMPLANT
NS IRRIG 1000ML POUR BTL (IV SOLUTION) ×3 IMPLANT
PACK CV ACCESS (CUSTOM PROCEDURE TRAY) ×3 IMPLANT
PAD ARMBOARD 7.5X6 YLW CONV (MISCELLANEOUS) ×6 IMPLANT
SPONGE SURGIFOAM ABS GEL 100 (HEMOSTASIS) IMPLANT
SUT PROLENE 6 0 BV (SUTURE) ×3 IMPLANT
SUT VIC AB 3-0 SH 27 (SUTURE) ×3
SUT VIC AB 3-0 SH 27X BRD (SUTURE) ×1 IMPLANT
SUT VICRYL 4-0 PS2 18IN ABS (SUTURE) ×3 IMPLANT
SYR 10ML LL (SYRINGE) ×2 IMPLANT
TOWEL GREEN STERILE (TOWEL DISPOSABLE) ×3 IMPLANT
UNDERPAD 30X30 (UNDERPADS AND DIAPERS) ×3 IMPLANT
WATER STERILE IRR 1000ML POUR (IV SOLUTION) ×3 IMPLANT

## 2019-01-19 NOTE — Interval H&P Note (Signed)
History and Physical Interval Note:  01/19/2019 7:22 AM  Alex Williams  has presented today for surgery, with the diagnosis of CHRONIC KIDNEY DISEASE STAGE V.  The various methods of treatment have been discussed with the patient and family. After consideration of risks, benefits and other options for treatment, the patient has consented to  Procedure(s): RIGHT BRACHIOCEPHALIC ARTERIOVENOUS (AV) FISTULA CREATION (Right) as a surgical intervention.  The patient's history has been reviewed, patient examined, no change in status, stable for surgery.  I have reviewed the patient's chart and labs.  Questions were answered to the patient's satisfaction.     Deitra Mayo

## 2019-01-19 NOTE — Anesthesia Postprocedure Evaluation (Signed)
Anesthesia Post Note  Patient: Alex Williams  Procedure(s) Performed: RIGHT BRACHIOCEPHALIC ARTERIOVENOUS (AV) FISTULA CREATION (Right Arm Upper)     Patient location during evaluation: PACU Anesthesia Type: MAC Level of consciousness: awake and alert, patient cooperative and oriented Pain management: pain level controlled Vital Signs Assessment: post-procedure vital signs reviewed and stable Respiratory status: spontaneous breathing, nonlabored ventilation and respiratory function stable Cardiovascular status: blood pressure returned to baseline and stable Postop Assessment: no apparent nausea or vomiting Anesthetic complications: no    Last Vitals:  Vitals:   01/19/19 1134 01/19/19 1145  BP: 119/69 120/67  Pulse: 79 78  Resp: 19   Temp: 36.4 C   SpO2: 100% 100%    Last Pain:  Vitals:   01/19/19 1134  TempSrc: Oral  PainSc:                  Mallarie Voorhies,E. Elizebath Wever

## 2019-01-19 NOTE — Op Note (Signed)
    NAME: Alex Williams    MRN: [id number] DOB: 04-Jun-1951    DATE OF OPERATION: 01/19/2019  PREOP DIAGNOSIS:    Stage 5 CKD  POSTOP DIAGNOSIS:    Same  PROCEDURE:    Right brachiocephalic AV fistula  SURGEON: Judeth Cornfield. Scot Dock, MD, FACS  ASSIST: Arlee Muslim, PA  ANESTHESIA: Local with sedation  EBL: Minimal  INDICATIONS:    Alex Williams is a 68 y.o. male who is not yet on dialysis.  He presents for new access.  FINDINGS:   4 mm right upper arm cephalic vein excellent thrill at the completion of the procedure and a palpable radial pulse  TECHNIQUE:   The patient was taken to the operating room and was sedated.  The right arm was prepped and draped in usual sterile fashion.  After the skin was anesthetized with 1% lidocaine a transverse incision was made just above the antecubital level.  Here the cephalic vein was dissected free.  It was ligated distally and irrigated up nicely with heparinized saline.  The brachial artery was dissected free beneath the fascia.  The patient was heparinized.  The brachial artery was clamped proximally and distally and a longitudinal arteriotomy was made.  The vein was sewn into side to the artery using continuous 6-0 Prolene suture.  At the completion there was an excellent thrill in the fistula and a palpable radial pulse.  Hemostasis was obtained in the wound.  The heparin was partially reversed with protamine.  The wound was closed with a deep layer of 3-0 Vicryl and the skin closed with 4-0 Vicryl.  Dermabond was applied.  The patient tolerated the procedure well was transferred to the recovery room in stable condition.  All needle and sponge counts were correct.  Deitra Mayo, MD, FACS [facility]  DATE OF DICTATION:   01/19/2019

## 2019-01-19 NOTE — Transfer of Care (Signed)
Immediate Anesthesia Transfer of Care Note  Patient: Alex Williams  Procedure(s) Performed: RIGHT BRACHIOCEPHALIC ARTERIOVENOUS (AV) FISTULA CREATION (Right Arm Upper)  Patient Location: PACU  Anesthesia Type:MAC  Level of Consciousness: awake, alert , oriented and patient cooperative  Airway & Oxygen Therapy: Patient Spontanous Breathing and Patient connected to nasal cannula oxygen  Post-op Assessment: Report given to RN and Post -op Vital signs reviewed and stable  Post vital signs: Reviewed and stable  Last Vitals:  Vitals Value Taken Time  BP 87/48 01/19/19 0848  Temp    Pulse 73 01/19/19 0849  Resp 19 01/19/19 0849  SpO2 100 % 01/19/19 0849  Vitals shown include unvalidated device data.  Last Pain:  Vitals:   01/19/19 7322  TempSrc: Oral  PainSc:          Complications: No apparent anesthesia complications

## 2019-01-20 ENCOUNTER — Encounter (HOSPITAL_COMMUNITY): Payer: Self-pay | Admitting: Vascular Surgery

## 2019-01-20 LAB — TYPE AND SCREEN
ABO/RH(D): O NEG
Antibody Screen: NEGATIVE
Unit division: 0
Weak D: POSITIVE

## 2019-01-20 LAB — BPAM RBC
Blood Product Expiration Date: 202007022359
ISSUE DATE / TIME: 202006291010
Unit Type and Rh: 9500

## 2019-01-22 NOTE — Progress Notes (Signed)
This encounter was created in error - please disregard.

## 2019-01-26 ENCOUNTER — Encounter: Payer: Self-pay | Admitting: Family Medicine

## 2019-01-26 ENCOUNTER — Ambulatory Visit (INDEPENDENT_AMBULATORY_CARE_PROVIDER_SITE_OTHER): Payer: Medicare Other | Admitting: Family Medicine

## 2019-01-26 ENCOUNTER — Other Ambulatory Visit: Payer: Self-pay

## 2019-01-26 ENCOUNTER — Ambulatory Visit (INDEPENDENT_AMBULATORY_CARE_PROVIDER_SITE_OTHER): Payer: Medicare Other

## 2019-01-26 VITALS — BP 134/84 | HR 98 | Temp 98.4°F | Ht 72.0 in | Wt 317.0 lb

## 2019-01-26 DIAGNOSIS — E8770 Fluid overload, unspecified: Secondary | ICD-10-CM | POA: Diagnosis not present

## 2019-01-26 DIAGNOSIS — R0609 Other forms of dyspnea: Secondary | ICD-10-CM

## 2019-01-26 DIAGNOSIS — N185 Chronic kidney disease, stage 5: Secondary | ICD-10-CM | POA: Diagnosis not present

## 2019-01-26 DIAGNOSIS — F5102 Adjustment insomnia: Secondary | ICD-10-CM

## 2019-01-26 DIAGNOSIS — D631 Anemia in chronic kidney disease: Secondary | ICD-10-CM

## 2019-01-26 MED ORDER — FUROSEMIDE 40 MG PO TABS: 140.0000 mg | ORAL_TABLET | Freq: Two times a day (BID) | ORAL | 0 refills | Status: DC

## 2019-01-26 MED ORDER — FUROSEMIDE 80 MG PO TABS: 80.0000 mg | ORAL_TABLET | Freq: Two times a day (BID) | ORAL | 0 refills | Status: DC

## 2019-01-26 MED ORDER — TRAZODONE HCL 50 MG PO TABS: 25.0000 mg | ORAL_TABLET | Freq: Every evening | ORAL | 3 refills | Status: DC | PRN

## 2019-01-26 NOTE — Progress Notes (Signed)
LABS SCHEDULED

## 2019-01-26 NOTE — Patient Instructions (Signed)
° ° ° °  If you have lab work done today you will be contacted with your lab results within the next 2 weeks.  If you have not heard from us then please contact us. The fastest way to get your results is to register for My Chart. ° ° °IF you received an x-ray today, you will receive an invoice from [facility]. Please contact [facility] at [phone] with questions or concerns regarding your invoice.  ° °IF you received labwork today, you will receive an invoice from [facility]. Please contact [facility] at [phone] with questions or concerns regarding your invoice.  ° °Our billing staff will not be able to assist you with questions regarding bills from these companies. ° °You will be contacted with the lab results as soon as they are available. The fastest way to get your results is to activate your My Chart account. Instructions are located on the last page of this paperwork. If you have not heard from us regarding the results in 2 weeks, please contact this office. °  ° ° ° °

## 2019-01-26 NOTE — Progress Notes (Signed)
7/6/20209:12 AM  Alex Williams 1950-12-15, 68 y.o., male [id number]  Chief Complaint  Patient presents with  . Post-op Problem    has fistula put in right arm for dialysis. Having some pain in tha area  . Back Pain    for the past 3 days lower left side  . Medication Refill    oxycodeine, only given 3    HPI:   Patient is a 68 y.o. male with past medical history significant for HTN, HLP, gout, CKD stage 5s/p fistula RUE who presents today for routine follouwp  Last OV June 2020 with Dr Carlota Raspberry RUE fistula placed 6/69/2020 by Dr Scot Dock - sees him again again in aug   reports fistula site is doing well, minimal pain No redness, warmth or drainage  Leg swelling better DOE with walking about 30-40 feet, since his fistula placement Reports not associated with palpitations or diaphoresis Will occasionally feel sharp chest pain between chest and back if he is doing lots around the house, rest makes it resolve Taking lasix 80mg  AM and 40mg  in the afternoon  Did a blood transfusion prior to fistula placement, hgb 6.8 Takes iron supplement by renal, has been having black stools since then No abd pain, nausea, appetite is good Denies any orthopnea, or PND, using one pillow Has mild productive cough x 3 days Does not have h/o asthma or COPD Denies any fever or chills Sees renal in about 2 weeks Reports good UOP terasozin helping with nocturia, down to about 2-3 times a night  Reports continued issues with sleeping Related to anxiety Takes 2 tylenol PM and melatonin 10 without help States RLS is worse, medication did not help  Also having left sided achy back pain Not radiating Overall not concerned   Depression screen [facility] 2/9 01/26/2019 12/17/2018 12/12/2018  Decreased Interest 0 0 0  Down, Depressed, Hopeless 0 0 0  PHQ - 2 Score 0 0 0  Some recent data might be hidden    Fall Risk  01/26/2019 12/17/2018 12/12/2018 12/09/2018 12/05/2018  Falls in the past year? 0 0 0 0 0   Number falls in past yr: 0 0 0 - 0  Injury with Fall? 0 0 0 - 0  Comment - - - - -  Risk for fall due to : - - - - -  Follow up - - Falls evaluation completed Falls evaluation completed Falls evaluation completed     Allergies  Allergen Reactions  . Codeine Phosphate Other (See Comments)    Hyperactive     Prior to Admission medications   Medication Sig Start Date End Date Taking? Authorizing Provider  acetaminophen (TYLENOL) 500 MG tablet Take 1,000 mg by mouth 2 (two) times a day. Takes 1000mg  in the morning and 1000mg  in the evening.    Yes [provider]  amLODipine (NORVASC) 10 MG tablet Take 1 tablet (10 mg total) by mouth daily. 02/11/18  Yes Weber, Sarah L, PA-C  CALCIUM CITRATE-VITAMIN D PO Take 1 tablet by mouth daily.    Yes [provider]  carvedilol (COREG) 25 MG tablet Take 25 mg by mouth every morning.  11/22/17  Yes [provider]  cetirizine (ZYRTEC) 10 MG tablet Take 10 mg by mouth daily as needed for allergies.    Yes [provider]  diphenhydramine-acetaminophen (TYLENOL PM) 25-500 MG TABS tablet Take 1 tablet by mouth at bedtime as needed (sleep).   Yes [provider]  Ferrous Sulfate (IRON) 325 (65  Fe) MG TABS Take 1 tablet by mouth daily at 6 (six) AM.   Yes [provider]  fluticasone (FLONASE) 50 MCG/ACT nasal spray Place 2 sprays into both nostrils daily. 09/16/18  Yes [name], MD  furosemide (LASIX) 80 MG tablet Take 1 tablet (80 mg total) by mouth daily. 08/29/18  Yes Mikhail, [place], DO  Multiple Vitamins-Minerals (ONE-A-DAY MENS 50+ ADVANTAGE PO) Take 1 tablet by mouth daily.   Yes [provider]  oxyCODONE (ROXICODONE) 5 MG immediate release tablet Take 1 tablet (5 mg total) by mouth every 4 (four) hours as needed. 01/19/19  Yes Angelia Mould, MD  rosuvastatin (CRESTOR) 5 MG tablet Take 1 tablet (5 mg total) by mouth daily. Patient taking differently: Take 5 mg by mouth See  admin instructions. Every 3 days 08/09/17  Yes Weber, Sarah L, PA-C  sodium bicarbonate 650 MG tablet Take 1 tablet (650 mg total) by mouth 2 (two) times daily. Patient taking differently: Take 1,300 mg by mouth 2 (two) times daily.  02/11/18  Yes Weber, Sarah L, PA-C  terazosin (HYTRIN) 1 MG capsule Take 1 capsule (1 mg total) by mouth at bedtime for 7 days, THEN 2 capsules (2 mg total) at bedtime for 21 days. 01/16/19 02/13/19 Yes [name], MD  ULORIC 40 MG tablet Take 40 mg by mouth every other day.  11/06/16  Yes [provider]    Past Medical History:  Diagnosis Date  . Cervical disc disease   . Chronic kidney disease   . COLONIC POLYPS, HX OF 06/27/2007  . EXOGENOUS OBESITY 01/30/2010  . GERD (gastroesophageal reflux disease)    PMH  . GOUT 01/30/2010  . HYPERCHOLESTEROLEMIA 06/30/2007  . HYPERTENSION 06/27/2007  . LOW BACK PAIN 06/27/2007  . NEPHROLITHIASIS, HX OF 06/27/2007  . OSTEOARTHRITIS 06/27/2007  . SLEEP APNEA, OBSTRUCTIVE, MODERATE 01/27/2009  . Wears dentures    upper    Past Surgical History:  Procedure Laterality Date  . AV FISTULA PLACEMENT Right 01/19/2019   Procedure: RIGHT BRACHIOCEPHALIC ARTERIOVENOUS (AV) FISTULA CREATION;  Surgeon: Angelia Mould, MD;  Location: [place];  Service: Vascular;  Laterality: Right;  . CARDIAC CATHETERIZATION    . CARPAL TUNNEL RELEASE     left hand  . COLONOSCOPY     with polypectomy  . JOINT REPLACEMENT Left 2005   knee  . LUMBAR LAMINECTOMY    . MULTIPLE TOOTH EXTRACTIONS    . RADIOFREQUENCY ABLATION KIDNEY    . TOTAL KNEE ARTHROPLASTY     left    Social History   Tobacco Use  . Smoking status: Former Smoker    Quit date: 07/23/1980    Years since quitting: 38.5  . Smokeless tobacco: Never Used  Substance Use Topics  . Alcohol use: Yes    Alcohol/week: 1.0 standard drinks    Types: 1 Cans of beer per week    Comment: rare    Family History  Problem Relation Age of Onset  . Hypertension Other    . Diabetes Sister   . Hyperlipidemia Sister   . Sleep apnea Sister     ROS Per hpi  OBJECTIVE:  Today's Vitals   01/26/19 0902  BP: 134/84  Pulse: 98  Temp: 98.4 F (36.9 C)  TempSrc: Oral  SpO2: 97%  Weight: (!) 317 lb (143.8 kg)  Height: 6' (1.829 m)   Body mass index is 42.99 kg/m.  Wt Readings from Last 3 Encounters:  01/26/19 (!) 317 lb (143.8 kg)  01/19/19 (!) 312 lb (141.5 kg)  01/15/19 (!) 311 lb (141.1 kg)    Physical Exam Vitals signs and nursing note reviewed.  Constitutional:      Appearance: He is well-developed.  HENT:     Head: Normocephalic and atraumatic.  Eyes:     Conjunctiva/sclera: Conjunctivae normal.     Pupils: Pupils are equal, round, and reactive to light.  Neck:     Musculoskeletal: Neck supple.     Vascular: No JVD.  Cardiovascular:     Rate and Rhythm: Normal rate and regular rhythm.     Heart sounds: No murmur. No friction rub. No gallop.      Arteriovenous access: right arteriovenous access is present.    Comments: + thrill Pulmonary:     Effort: Pulmonary effort is normal.     Breath sounds: Normal breath sounds. No wheezing or rales.  Musculoskeletal:     Right lower leg: 2+ Pitting Edema present.     Left lower leg: 2+ Pitting Edema present.  Skin:    General: Skin is warm and dry.  Neurological:     Mental Status: He is alert and oriented to person, place, and time.    My interpretation of EKG:  NSR, HR 84, no acute ST changes, unchanged compared to feb 2020  Dg Chest 2 View  Result Date: 01/26/2019 CLINICAL DATA:  Dyspnea on exertion. EXAM: CHEST - 2 VIEW COMPARISON:  Chest x-ray dated 08/25/2018 and 01/24/2010. FINDINGS: Heart size and mediastinal contours are stable. Bilateral perihilar opacities. No pleural effusion or pneumothorax seen. No acute or suspicious osseous finding. IMPRESSION: Bilateral perihilar opacities, most likely pulmonary edema. Electronically Signed   By: Franki Cabot M.D.   On: 01/26/2019 10:19      ASSESSMENT and PLAN  1. DOE (dyspnea on exertion) Most likely from fluid overload 2/2 CKD, anemia might be contributing. Rechecking h/h. BNP to r/o cardiac contribution. Increase lasix to 80mg  BID. Repeat labs this Friday.  - EKG 12-Lead - DG Chest 2 View; Future - Brain natriuretic peptide; Future  2. Anemia due to stage 5 chronic kidney disease, not on chronic dialysis (HCC) - CBC - Iron, TIBC and Ferritin Panel - POC Hemoccult Bld/Stl (3-Cd Home Screen); Future  3. Adjustment insomnia Struggling with recent health decline and possible HD. Starting trazodone, reviewed r/se/b  4. Hypervolemia, unspecified hypervolemia type - Brain natriuretic peptide; Future - Basic Metabolic Panel; Future  Other orders - traZODone (DESYREL) 50 MG tablet; Take 0.5-1 tablets (25-50 mg total) by mouth at bedtime as needed for sleep. - furosemide (LASIX) 80 MG tablet; Take 1 tablet (80 mg total) by mouth 2 (two) times daily.  Return in about 1 week (around 02/02/2019).    [name], MD Primary Care at [place], [place] [address] Ph.  [phone] Fax [phone]

## 2019-01-27 LAB — CBC
Hematocrit: 23.2 % — ABNORMAL LOW (ref 37.5–51.0)
Hemoglobin: 7.3 g/dL — ABNORMAL LOW (ref 13.0–17.7)
MCH: 26.5 pg — ABNORMAL LOW (ref 26.6–33.0)
MCHC: 31.5 g/dL (ref 31.5–35.7)
MCV: 84 fL (ref 79–97)
Platelets: 267 10*3/uL (ref 150–450)
RBC: 2.75 x10E6/uL — ABNORMAL LOW (ref 4.14–5.80)
RDW: 15.2 % (ref 11.6–15.4)
WBC: 9.5 10*3/uL (ref 3.4–10.8)

## 2019-01-27 LAB — IRON,TIBC AND FERRITIN PANEL
Ferritin: 301 ng/mL (ref 30–400)
Iron Saturation: 14 % — ABNORMAL LOW (ref 15–55)
Iron: 32 ug/dL — ABNORMAL LOW (ref 38–169)
Total Iron Binding Capacity: 226 ug/dL — ABNORMAL LOW (ref 250–450)
UIBC: 194 ug/dL (ref 111–343)

## 2019-01-30 ENCOUNTER — Other Ambulatory Visit: Payer: Self-pay

## 2019-01-30 ENCOUNTER — Ambulatory Visit (INDEPENDENT_AMBULATORY_CARE_PROVIDER_SITE_OTHER): Payer: Medicare Other | Admitting: Family Medicine

## 2019-01-30 DIAGNOSIS — R0609 Other forms of dyspnea: Secondary | ICD-10-CM

## 2019-01-30 DIAGNOSIS — E8770 Fluid overload, unspecified: Secondary | ICD-10-CM

## 2019-01-30 DIAGNOSIS — R7989 Other specified abnormal findings of blood chemistry: Secondary | ICD-10-CM

## 2019-01-30 DIAGNOSIS — N185 Chronic kidney disease, stage 5: Secondary | ICD-10-CM

## 2019-01-31 LAB — BASIC METABOLIC PANEL
BUN/Creatinine Ratio: 10 (ref 10–24)
BUN: 84 mg/dL (ref 8–27)
CO2: 16 mmol/L — ABNORMAL LOW (ref 20–29)
Calcium: 10.7 mg/dL — ABNORMAL HIGH (ref 8.6–10.2)
Chloride: 105 mmol/L (ref 96–106)
Creatinine, Ser: 8.08 mg/dL (ref 0.76–1.27)
GFR calc Af Amer: 7 mL/min/{1.73_m2} — ABNORMAL LOW (ref >59)
GFR calc non Af Amer: 6 mL/min/{1.73_m2} — ABNORMAL LOW (ref >59)
Glucose: 92 mg/dL (ref 65–99)
Potassium: 4.7 mmol/L (ref 3.5–5.2)
Sodium: 142 mmol/L (ref 134–144)

## 2019-01-31 LAB — BRAIN NATRIURETIC PEPTIDE: BNP: 1138.2 pg/mL — ABNORMAL HIGH (ref 0.0–100.0)

## 2019-01-31 NOTE — Addendum Note (Signed)
Addended by: [name] on: 01/31/2019 10:18 PM   Modules accepted: Orders

## 2019-02-02 ENCOUNTER — Ambulatory Visit: Payer: Medicare Other

## 2019-02-02 ENCOUNTER — Telehealth: Payer: Self-pay

## 2019-02-02 NOTE — Telephone Encounter (Signed)
[facility] reporting BUN of.  Per chart, already reviewed and labs addressed by MD.

## 2019-02-05 ENCOUNTER — Emergency Department (HOSPITAL_BASED_OUTPATIENT_CLINIC_OR_DEPARTMENT_OTHER)
Admission: EM | Admit: 2019-02-05 | Discharge: 2019-02-05 | Disposition: A | Discharge status: Home / Self Care | Payer: Medicare Other | Attending: Emergency Medicine | Admitting: Emergency Medicine

## 2019-02-05 ENCOUNTER — Other Ambulatory Visit: Payer: Self-pay | Admitting: Family Medicine

## 2019-02-05 ENCOUNTER — Encounter (HOSPITAL_BASED_OUTPATIENT_CLINIC_OR_DEPARTMENT_OTHER): Payer: Self-pay | Admitting: Emergency Medicine

## 2019-02-05 ENCOUNTER — Ambulatory Visit: Payer: Medicare Other | Admitting: Cardiology

## 2019-02-05 ENCOUNTER — Telehealth: Payer: Self-pay

## 2019-02-05 ENCOUNTER — Telehealth: Payer: Self-pay | Admitting: Cardiology

## 2019-02-05 ENCOUNTER — Emergency Department (HOSPITAL_BASED_OUTPATIENT_CLINIC_OR_DEPARTMENT_OTHER): Payer: Medicare Other

## 2019-02-05 ENCOUNTER — Other Ambulatory Visit: Payer: Self-pay

## 2019-02-05 DIAGNOSIS — Z20828 Contact with and (suspected) exposure to other viral communicable diseases: Secondary | ICD-10-CM | POA: Diagnosis not present

## 2019-02-05 DIAGNOSIS — Z87891 Personal history of nicotine dependence: Secondary | ICD-10-CM | POA: Insufficient documentation

## 2019-02-05 DIAGNOSIS — J810 Acute pulmonary edema: Secondary | ICD-10-CM | POA: Diagnosis not present

## 2019-02-05 DIAGNOSIS — I120 Hypertensive chronic kidney disease with stage 5 chronic kidney disease or end stage renal disease: Secondary | ICD-10-CM | POA: Insufficient documentation

## 2019-02-05 DIAGNOSIS — Z96652 Presence of left artificial knee joint: Secondary | ICD-10-CM | POA: Diagnosis not present

## 2019-02-05 DIAGNOSIS — R0602 Shortness of breath: Secondary | ICD-10-CM | POA: Diagnosis present

## 2019-02-05 DIAGNOSIS — N185 Chronic kidney disease, stage 5: Secondary | ICD-10-CM | POA: Diagnosis not present

## 2019-02-05 DIAGNOSIS — Z79899 Other long term (current) drug therapy: Secondary | ICD-10-CM | POA: Diagnosis not present

## 2019-02-05 LAB — CBC WITH DIFFERENTIAL/PLATELET
Abs Immature Granulocytes: 0.04 10*3/uL (ref 0.00–0.07)
Basophils Absolute: 0 10*3/uL (ref 0.0–0.1)
Basophils Relative: 1 %
Eosinophils Absolute: 0.2 10*3/uL (ref 0.0–0.5)
Eosinophils Relative: 2 %
HCT: 23.6 % — ABNORMAL LOW (ref 39.0–52.0)
Hemoglobin: 7 g/dL — ABNORMAL LOW (ref 13.0–17.0)
Immature Granulocytes: 1 %
Lymphocytes Relative: 15 %
Lymphs Abs: 1.3 10*3/uL (ref 0.7–4.0)
MCH: 26.4 pg (ref 26.0–34.0)
MCHC: 29.7 g/dL — ABNORMAL LOW (ref 30.0–36.0)
MCV: 89.1 fL (ref 80.0–100.0)
Monocytes Absolute: 0.7 10*3/uL (ref 0.1–1.0)
Monocytes Relative: 8 %
Neutro Abs: 6.3 10*3/uL (ref 1.7–7.7)
Neutrophils Relative %: 73 %
Platelets: 249 10*3/uL (ref 150–400)
RBC: 2.65 MIL/uL — ABNORMAL LOW (ref 4.22–5.81)
RDW: 15.8 % — ABNORMAL HIGH (ref 11.5–15.5)
WBC: 8.5 10*3/uL (ref 4.0–10.5)
nRBC: 0 % (ref 0.0–0.2)

## 2019-02-05 LAB — COMPREHENSIVE METABOLIC PANEL
ALT: 8 U/L (ref 0–44)
AST: 10 U/L — ABNORMAL LOW (ref 15–41)
Albumin: 3.4 g/dL — ABNORMAL LOW (ref 3.5–5.0)
Alkaline Phosphatase: 56 U/L (ref 38–126)
Anion gap: 17 — ABNORMAL HIGH (ref 5–15)
BUN: 97 mg/dL — ABNORMAL HIGH (ref 8–23)
CO2: 17 mmol/L — ABNORMAL LOW (ref 22–32)
Calcium: 10.4 mg/dL — ABNORMAL HIGH (ref 8.9–10.3)
Chloride: 105 mmol/L (ref 98–111)
Creatinine, Ser: 8.3 mg/dL — ABNORMAL HIGH (ref 0.61–1.24)
GFR calc Af Amer: 7 mL/min — ABNORMAL LOW (ref >60)
GFR calc non Af Amer: 6 mL/min — ABNORMAL LOW (ref >60)
Glucose, Bld: 107 mg/dL — ABNORMAL HIGH (ref 70–99)
Potassium: 4.2 mmol/L (ref 3.5–5.1)
Sodium: 139 mmol/L (ref 135–145)
Total Bilirubin: 0.8 mg/dL (ref 0.3–1.2)
Total Protein: 6.4 g/dL — ABNORMAL LOW (ref 6.5–8.1)

## 2019-02-05 LAB — SARS CORONAVIRUS 2 BY RT PCR (HOSPITAL ORDER, PERFORMED IN ~~LOC~~ HOSPITAL LAB): SARS Coronavirus 2: NEGATIVE

## 2019-02-05 LAB — BRAIN NATRIURETIC PEPTIDE: B Natriuretic Peptide: 1186.2 pg/mL — ABNORMAL HIGH (ref 0.0–100.0)

## 2019-02-05 MED ORDER — FUROSEMIDE 80 MG PO TABS: 160.0000 mg | ORAL_TABLET | Freq: Two times a day (BID) | ORAL | 0 refills | Status: DC

## 2019-02-05 MED ORDER — FUROSEMIDE 10 MG/ML IJ SOLN
80.0000 mg | Freq: Once | INTRAMUSCULAR | Status: AC
  Administered 2019-02-05: 80 mg via INTRAVENOUS
  Filled 2019-02-05: qty 8

## 2019-02-05 NOTE — ED Notes (Signed)
ED Provider at bedside. 

## 2019-02-05 NOTE — Telephone Encounter (Signed)
Pt screened for COVID at front door prior to entering clinic for office visit. Reports new onset SOB and Cough. Afebrile.  Has not traveled outside state in last 30 days, denies muscle aches, being around anyone with COVID or symptoms of COVID, Has not had COVID test. Sent to ER for COVID test

## 2019-02-05 NOTE — Discharge Instructions (Addendum)
Your shortness of breath is from too much fluid in your lungs.  [name] would like you to increase your Lasix to 160 mg twice per day.  If your shortness of breath becomes more consistent or worse, you develop chest pain, or any other new/concerning symptoms then return to the ER for evaluation.

## 2019-02-05 NOTE — ED Provider Notes (Addendum)
[facility] EMERGENCY DEPARTMENT Provider Note   CSN: [id number] Arrival date & time: 02/05/19  0321    History   Chief Complaint Chief Complaint  Patient presents with  . Shortness of Breath    HPI Alex Williams is a 68 y.o. male.     HPI  68 year old male sent down from his doctor's office for a COVID test.  The patient was going to go see his cardiologist today.  He had a right AV fistula laced on 6/29.  He states since then he has been feeling short of breath.  This is only when he walks after about 30 or 40 feet.  He can rest and then does better.  He has a cough when he first wakes up with some phlegm and then throughout the day he has been having a little bit of a tickle in his throat and occasional dry cough.  No chest pain or fever.  Leg swelling is improved from prior.  They asked him about loss of taste and he states that he has lost the taste for [place], explaining to me that it does not taste good anymore.  Otherwise he can taste fine.  Past Medical History:  Diagnosis Date  . Cervical disc disease   . Chronic kidney disease   . COLONIC POLYPS, HX OF 06/27/2007  . EXOGENOUS OBESITY 01/30/2010  . GERD (gastroesophageal reflux disease)    PMH  . GOUT 01/30/2010  . HYPERCHOLESTEROLEMIA 06/30/2007  . HYPERTENSION 06/27/2007  . LOW BACK PAIN 06/27/2007  . NEPHROLITHIASIS, HX OF 06/27/2007  . OSTEOARTHRITIS 06/27/2007  . SLEEP APNEA, OBSTRUCTIVE, MODERATE 01/27/2009  . Wears dentures    upper    Patient Active Problem List   Diagnosis Date Noted  . Acute on chronic renal failure ([place]) 08/26/2018  . Nonspecific abnormal electrocardiogram (ECG) (EKG) 08/25/2018  . Anemia in CKD (chronic kidney disease) 08/25/2018  . Elevated troponin 08/25/2018  . SOB (shortness of breath) 08/25/2018  . Acute pulmonary edema (HCC)   . Acute renal failure superimposed on stage 5 chronic kidney disease, not on chronic dialysis ([place]) 02/13/2018  . Morbid obesity with BMI of  45.0-49.9, adult ([place]) 12/03/2013  . Renal mass 12/02/2013  . Lower extremity edema 03/16/2013  . CKD (chronic kidney disease), stage III ([place]) 12/10/2011  . GOUT 01/30/2010  . Obstructive sleep apnea 01/27/2009  . HYPERCHOLESTEROLEMIA 06/30/2007  . Essential hypertension 06/27/2007  . Osteoarthritis 06/27/2007  . LOW BACK PAIN 06/27/2007  . History of colonic polyps 06/27/2007  . NEPHROLITHIASIS, HX OF 06/27/2007    Past Surgical History:  Procedure Laterality Date  . AV FISTULA PLACEMENT Right 01/19/2019   Procedure: RIGHT BRACHIOCEPHALIC ARTERIOVENOUS (AV) FISTULA CREATION;  Surgeon: Angelia Mould, MD;  Location: [place];  Service: Vascular;  Laterality: Right;  . CARDIAC CATHETERIZATION    . CARPAL TUNNEL RELEASE     left hand  . COLONOSCOPY     with polypectomy  . JOINT REPLACEMENT Left 2005   knee  . LUMBAR LAMINECTOMY    . MULTIPLE TOOTH EXTRACTIONS    . RADIOFREQUENCY ABLATION KIDNEY    . TOTAL KNEE ARTHROPLASTY     left        Home Medications    Prior to Admission medications   Medication Sig Start Date End Date Taking? Authorizing Provider  acetaminophen (TYLENOL) 500 MG tablet Take 1,000 mg by mouth 2 (two) times a day. Takes 1000mg  in the morning and 1000mg  in the evening.  [provider]  amLODipine (NORVASC) 10 MG tablet Take 1 tablet (10 mg total) by mouth daily. 02/11/18   Weber, Damaris Hippo, PA-C  CALCIUM CITRATE-VITAMIN D PO Take 1 tablet by mouth daily.     [provider]  carvedilol (COREG) 25 MG tablet Take 25 mg by mouth every morning.  11/22/17   [provider]  cetirizine (ZYRTEC) 10 MG tablet Take 10 mg by mouth daily as needed for allergies.     [provider]  diphenhydramine-acetaminophen (TYLENOL PM) 25-500 MG TABS tablet Take 1 tablet by mouth at bedtime as needed (sleep).    [provider]  Ferrous Sulfate (IRON) 325 (65 Fe) MG TABS Take 1 tablet by mouth daily at 6 (six) AM.    [provider]  fluticasone (FLONASE) 50 MCG/ACT nasal spray Place 2 sprays into both nostrils daily. 09/16/18   [name], MD  furosemide (LASIX) 80 MG tablet Take 2 tablets (160 mg total) by mouth 2 (two) times daily. 02/05/19 03/07/19  Sherwood Gambler, MD  Multiple Vitamins-Minerals (ONE-A-DAY MENS 50+ ADVANTAGE PO) Take 1 tablet by mouth daily.    [provider]  oxyCODONE (ROXICODONE) 5 MG immediate release tablet Take 1 tablet (5 mg total) by mouth every 4 (four) hours as needed. 01/19/19   Angelia Mould, MD  rosuvastatin (CRESTOR) 5 MG tablet Take 1 tablet (5 mg total) by mouth daily. Patient taking differently: Take 5 mg by mouth See admin instructions. Every 3 days 08/09/17   Gale Journey, Damaris Hippo, PA-C  sodium bicarbonate 650 MG tablet Take 1 tablet (650 mg total) by mouth 2 (two) times daily. Patient taking differently: Take 1,300 mg by mouth 2 (two) times daily.  02/11/18   Gale Journey, Damaris Hippo, PA-C  terazosin (HYTRIN) 1 MG capsule Take 1 capsule (1 mg total) by mouth at bedtime for 7 days, THEN 2 capsules (2 mg total) at bedtime for 21 days. 01/16/19 02/13/19  [name], MD  traZODone (DESYREL) 50 MG tablet Take 0.5-1 tablets (25-50 mg total) by mouth at bedtime as needed for sleep. 01/26/19   [name], MD  ULORIC 40 MG tablet Take 40 mg by mouth every other day.  11/06/16   [provider]    Family History Family History  Problem Relation Age of Onset  . Hypertension Other   . Diabetes Sister   . Hyperlipidemia Sister   . Sleep apnea Sister     Social History Social History   Tobacco Use  . Smoking status: Former Smoker    Quit date: 07/23/1980    Years since quitting: 38.5  . Smokeless tobacco: Never Used  Substance Use Topics  . Alcohol use: Yes    Alcohol/week: 1.0 standard drinks    Types: 1 Cans of beer per week    Comment: rare  . Drug use: No     Allergies   Codeine phosphate   Review of Systems Review of Systems   Constitutional: Negative for fever.  Respiratory: Positive for cough and shortness of breath.   Cardiovascular: Positive for leg swelling. Negative for chest pain.  All other systems reviewed and are negative.    Physical Exam Updated Vital Signs BP 125/74   Pulse 83   Temp 98.3 F (36.8 C) (Oral)   Resp (!) 22   Ht 6' (1.829 m)   Wt 136.1 kg   SpO2 100%   BMI 40.69 kg/m   Physical Exam Vitals signs and nursing  note reviewed.  Constitutional:      Appearance: He is well-developed. He is obese.  HENT:     Head: Normocephalic and atraumatic.     Right Ear: External ear normal.     Left Ear: External ear normal.     Nose: Nose normal.  Eyes:     General:        Right eye: No discharge.        Left eye: No discharge.  Neck:     Musculoskeletal: Neck supple.  Cardiovascular:     Rate and Rhythm: Normal rate and regular rhythm.     Heart sounds: Normal heart sounds.  Pulmonary:     Effort: Pulmonary effort is normal.     Breath sounds: Wheezing present. No rhonchi or rales.  Abdominal:     Palpations: Abdomen is soft.     Tenderness: There is no abdominal tenderness.  Musculoskeletal:     Right lower leg: Edema present.     Left lower leg: Edema present.     Comments: Mild pitting edema in BLE  Skin:    General: Skin is warm and dry.  Neurological:     Mental Status: He is alert.  Psychiatric:        Mood and Affect: Mood is not anxious.      ED Treatments / Results  Labs (all labs ordered are listed, but only abnormal results are displayed) Labs Reviewed  COMPREHENSIVE METABOLIC PANEL - Abnormal; Notable for the following components:      Result Value   CO2 17 (*)    Glucose, Bld 107 (*)    BUN 97 (*)    Creatinine, Ser 8.30 (*)    Calcium 10.4 (*)    Total Protein 6.4 (*)    Albumin 3.4 (*)    AST 10 (*)    GFR calc non Af Amer 6 (*)    GFR calc Af Amer 7 (*)    Anion gap 17 (*)    All other components within normal limits  CBC WITH  DIFFERENTIAL/PLATELET - Abnormal; Notable for the following components:   RBC 2.65 (*)    Hemoglobin 7.0 (*)    HCT 23.6 (*)    MCHC 29.7 (*)    RDW 15.8 (*)    All other components within normal limits  BRAIN NATRIURETIC PEPTIDE - Abnormal; Notable for the following components:   B Natriuretic Peptide 1,186.2 (*)    All other components within normal limits  SARS CORONAVIRUS 2 (HOSPITAL ORDER, [facility])    EKG EKG Interpretation  Date/Time:  Thursday February 05 2019 10:08:13 EDT Ventricular Rate:  81 PR Interval:    QRS Duration: 104 QT Interval:  384 QTC Calculation: 446 R Axis:   56 Text Interpretation:  Sinus rhythm Anterior infarct, old poor data tracing, otherwise similar to Feb 2020 Confirmed by Sherwood Gambler [phone]) on 02/05/2019 10:52:59 AM   Radiology Dg Chest Portable 1 View  Result Date: 02/05/2019 CLINICAL DATA:  68 year old with end-stage renal disease who had a hemodialysis graft placed on 01/19/2019. Patient states that he has been short of breath since that time. EXAM: PORTABLE CHEST 1 VIEW COMPARISON:  01/26/2019 and earlier, including CTA chest 01/28/2010. FINDINGS: Cardiac silhouette moderately enlarged for AP portable technique. Patchy perihilar airspace opacities in both lungs. Mild diffuse interstitial pulmonary edema as evidenced by Awanda Mink B lines in both lung bases. No visible pleural effusions. IMPRESSION: Mild-to-moderate CHF, with stable moderate cardiomegaly and  mild diffuse interstitial and BILATERAL perihilar airspace pulmonary edema. Electronically Signed   By: Evangeline Dakin M.D.   On: 02/05/2019 10:16    Procedures Procedures (including critical care time)  Medications Ordered in ED Medications  furosemide (LASIX) injection 80 mg (80 mg Intravenous Given 02/05/19 1146)     Initial Impression / Assessment and Plan / ED Course  I have reviewed the triage vital signs and the nursing notes.  Pertinent labs & imaging  results that were available during my care of the patient were reviewed by me and considered in my medical decision making (see chart for details).        Shortness of breath appears to be due to his pulmonary edema from his chronic kidney disease rather than COVID-19 or new/acute process.  Given IV Lasix.  I discussed with the cardiologist he is supposed to see but from his opinion this is more of a renal problem.  I discussed with his nephrologist, [name], who advises for him to be increased from 80 mg twice daily to 160 mg twice daily.  Already has follow-up next week in the office.  Discussed return precautions. He is not short of breath at rest, and has no anginal symptoms.  Lemond A Urbanik was evaluated in Emergency Department on 02/05/2019 for the symptoms described in the history of present illness. He was evaluated in the context of the global COVID-19 pandemic, which necessitated consideration that the patient might be at risk for infection with the SARS-CoV-2 virus that causes COVID-19. Institutional protocols and algorithms that pertain to the evaluation of patients at risk for COVID-19 are in a state of rapid change based on information released by regulatory bodies including the CDC and federal and state organizations. These policies and algorithms were followed during the patient's care in the ED.   Final Clinical Impressions(s) / ED Diagnoses   Final diagnoses:  Acute pulmonary edema (HCC)  Stage 5 chronic kidney disease not on chronic dialysis [facility])    ED Discharge Orders         Ordered    furosemide (LASIX) 80 MG tablet  2 times daily    Note to Pharmacy: Please disregard previous prescription   02/05/19 1158           Sherwood Gambler, MD 02/05/19 1212    Sherwood Gambler, MD 02/05/19 1212

## 2019-02-05 NOTE — ED Notes (Signed)
Ambulated to BR, gait steady

## 2019-02-05 NOTE — ED Triage Notes (Addendum)
SOB x 2 weeks, worse than usual. S/P HD graft on 01/19/19 and states since that time has been having SOB. Denies CP

## 2019-02-05 NOTE — Telephone Encounter (Signed)
I called patient to reschedule Consult appt from 02/05/2019 after patient was sent to ED from [place] office in [place].  Patient was cleared of CVOID in ED and was referred back to PCP and Kidney dr, patient declined to reschedule appointment at Cardiology at this time.

## 2019-02-05 NOTE — Telephone Encounter (Signed)
Requested medication (s) are due for refill today: Early  Requested medication (s) are on the active medication list: Yes  Last refill:  01/16/19  Future visit scheduled: Yes  Notes to clinic:  See request    Requested Prescriptions  Pending Prescriptions Disp Refills   terazosin (HYTRIN) 1 MG capsule [Pharmacy Med Name: TERAZOSIN 1 MG CAPSULE] 49 capsule 0    Sig: TAKE 1 CAPSULE BY MOUTH AT BEDTIME FOR 7 DAYS, THEN 2 CAPSULES AT BEDTIME FOR 21 DAYS.     Cardiovascular:  Alpha Blockers Passed - 02/05/2019 12:12 PM      Passed - Last BP in normal range    BP Readings from Last 1 Encounters:  02/05/19 125/74         Passed - Valid encounter within last 6 months    Recent Outpatient Visits          6 days ago CKD (chronic kidney disease) stage 5, GFR less than 15 ml/min [facility])   [facility], Lilia Argue, MD   1 week ago DOE (dyspnea on exertion)   [facility], Lilia Argue, MD   1 month ago Right leg swelling   [facility], Ranell Patrick, MD   1 month ago Cellulitis of leg, right   Primary Care at [place], MD   1 month ago Stage 5 chronic kidney disease not on chronic dialysis [facility])   [facility] at [facility], Ranell Patrick, MD      Future Appointments            In 3 weeks [name], MD Primary Care at [place], [facility]

## 2019-02-08 NOTE — Telephone Encounter (Signed)
Patient is requesting a refill of the following medications: Requested Prescriptions   Pending Prescriptions Disp Refills  . terazosin (HYTRIN) 1 MG capsule [Pharmacy Med Name: TERAZOSIN 1 MG CAPSULE] 49 capsule 0    Sig: TAKE 1 CAPSULE BY MOUTH AT BEDTIME FOR 7 DAYS, THEN 2 CAPSULES AT BEDTIME FOR 21 DAYS.    Date of patient request: 02/05/2019 Last office visit: 01/30/2019 Date of last refill:01/16/2019 Last refill amount: #49 Follow up time period per chart:

## 2019-02-09 ENCOUNTER — Other Ambulatory Visit: Payer: Self-pay

## 2019-02-09 DIAGNOSIS — D631 Anemia in chronic kidney disease: Secondary | ICD-10-CM

## 2019-02-09 LAB — POC HEMOCCULT BLD/STL (HOME/3-CARD/SCREEN)
Card #2 Fecal Occult Blod, POC: POSITIVE
Card #3 Fecal Occult Blood, POC: NEGATIVE
Fecal Occult Blood, POC: POSITIVE — AB

## 2019-02-17 ENCOUNTER — Other Ambulatory Visit: Payer: Self-pay | Admitting: Family Medicine

## 2019-02-17 NOTE — Telephone Encounter (Signed)
Please review for pharmacy request of trazodone 50 mg tablet. Dr. Pamella Pert

## 2019-02-17 NOTE — Telephone Encounter (Signed)
Need clarification on LASIX 40 mg and prescription has and end date of 07/16 per chart but on prescription it is 03/07/2019. Dr Pamella Pert

## 2019-02-17 NOTE — Telephone Encounter (Signed)
Pt asking for med that doctor discontinued, needs appt with [place]

## 2019-02-21 ENCOUNTER — Other Ambulatory Visit: Payer: Self-pay | Admitting: Family Medicine

## 2019-02-26 ENCOUNTER — Other Ambulatory Visit: Payer: Self-pay

## 2019-02-26 DIAGNOSIS — N185 Chronic kidney disease, stage 5: Secondary | ICD-10-CM

## 2019-03-02 ENCOUNTER — Other Ambulatory Visit: Payer: Self-pay

## 2019-03-02 ENCOUNTER — Ambulatory Visit (HOSPITAL_COMMUNITY)
Admission: RE | Admit: 2019-03-02 | Discharge: 2019-03-02 | Disposition: A | Discharge status: Home / Self Care | Payer: Medicare Other | Source: Ambulatory Visit | Attending: Vascular Surgery | Admitting: Vascular Surgery

## 2019-03-02 ENCOUNTER — Ambulatory Visit (INDEPENDENT_AMBULATORY_CARE_PROVIDER_SITE_OTHER): Payer: Self-pay | Admitting: Physician Assistant

## 2019-03-02 VITALS — BP 125/79 | HR 88 | Temp 98.8°F | Resp 12 | Ht 72.0 in | Wt 299.0 lb

## 2019-03-02 DIAGNOSIS — N185 Chronic kidney disease, stage 5: Secondary | ICD-10-CM

## 2019-03-02 DIAGNOSIS — N179 Acute kidney failure, unspecified: Secondary | ICD-10-CM

## 2019-03-02 NOTE — Progress Notes (Signed)
    Postoperative Access Visit   History of Present Illness   Alex Williams is a 68 y.o. year old male who presents for postoperative follow-up for: right brachiocephalic arteriovenous fistula by Dr. Laverda Page: 01/19/19).  The patient's wounds are healed.  The patient denies steal symptoms.  The patient is able to complete their activities of daily living.  He is not yet on hemodialysis.   Physical Examination   Vitals:   03/02/19 1348  BP: 125/79  Pulse: 88  Resp: 12  Temp: 98.8 F (37.1 C)  TempSrc: Temporal  SpO2: 99%  Weight: 299 lb (135.6 kg)  Height: 6' (1.829 m)   Body mass index is 40.55 kg/m.  right arm Incision is healed, hand grip is 5/5, sensation in digits is intact, palpable thrill, bruit can be auscultated, palpable R radial pulse     Medical Decision Making   Alex Williams is a 68 y.o. year old male who presents s/p right brachiocephalic arteriovenous fistula   Patent fistula without signs or symptoms of steal syndrome  The patient's access will be ready for use 04/13/19  The patient may follow up on a prn basis   Dagoberto Ligas PA-C [facility] Office: [phone]  Clinic MD: Dr. Trula Slade

## 2019-03-03 ENCOUNTER — Ambulatory Visit (INDEPENDENT_AMBULATORY_CARE_PROVIDER_SITE_OTHER): Payer: Medicare Other | Admitting: Family Medicine

## 2019-03-03 ENCOUNTER — Encounter: Payer: Self-pay | Admitting: Family Medicine

## 2019-03-03 ENCOUNTER — Ambulatory Visit (HOSPITAL_COMMUNITY): Payer: Medicare Other

## 2019-03-03 ENCOUNTER — Other Ambulatory Visit: Payer: Self-pay

## 2019-03-03 VITALS — BP 114/60 | HR 95 | Temp 98.2°F | Ht 72.0 in | Wt 298.0 lb

## 2019-03-03 DIAGNOSIS — M545 Low back pain, unspecified: Secondary | ICD-10-CM

## 2019-03-03 DIAGNOSIS — I10 Essential (primary) hypertension: Secondary | ICD-10-CM

## 2019-03-03 DIAGNOSIS — N185 Chronic kidney disease, stage 5: Secondary | ICD-10-CM

## 2019-03-03 DIAGNOSIS — L299 Pruritus, unspecified: Secondary | ICD-10-CM | POA: Diagnosis not present

## 2019-03-03 DIAGNOSIS — L298 Other pruritus: Secondary | ICD-10-CM

## 2019-03-03 MED ORDER — HYDROXYZINE HCL 25 MG PO TABS: 25.0000 mg | ORAL_TABLET | Freq: Three times a day (TID) | ORAL | 0 refills | Status: DC | PRN

## 2019-03-03 MED ORDER — TRAMADOL HCL 50 MG PO TABS: 50.0000 mg | ORAL_TABLET | Freq: Two times a day (BID) | ORAL | 2 refills | Status: AC | PRN

## 2019-03-03 MED ORDER — AMLODIPINE BESYLATE 10 MG PO TABS: 10.0000 mg | ORAL_TABLET | Freq: Every day | ORAL | 3 refills | Status: DC

## 2019-03-03 NOTE — Patient Instructions (Signed)
° ° ° °  If you have lab work done today you will be contacted with your lab results within the next 2 weeks.  If you have not heard from us then please contact us. The fastest way to get your results is to register for My Chart. ° ° °IF you received an x-ray today, you will receive an invoice from [facility]. Please contact [facility] at [phone] with questions or concerns regarding your invoice.  ° °IF you received labwork today, you will receive an invoice from [facility]. Please contact [facility] at [phone] with questions or concerns regarding your invoice.  ° °Our billing staff will not be able to assist you with questions regarding bills from these companies. ° °You will be contacted with the lab results as soon as they are available. The fastest way to get your results is to activate your My Chart account. Instructions are located on the last page of this paperwork. If you have not heard from us regarding the results in 2 weeks, please contact this office. °  ° ° ° °

## 2019-03-03 NOTE — Progress Notes (Signed)
8/11/20208:50 AM  Alex Williams Dec 30, 1950, 67 y.o., male [id number]  Chief Complaint  Patient presents with  . Chronic Kidney Disease  . Medication Refill    amlodipine, tramadol     HPI:   Patient is a 68 y.o. male with past medical history significant for HTN, HLP, gout, CKD stage 5s/p fistula RUE  who presents today for routine followup  Last OV July 2020 Increased lasix to 80mg  BID Referred to cards for acute DOE in setting of recent fistula placement Seen in ED on July 16 for acute pulmonary edema - treated with IV lasix 80mg  x 1, discussed with cards - does not think cardiac, discussed with renal - increased oral lasix to 160mg  BID Saw vas surg yesterday - fistula doing well, ready for use 04/13/2019, has been released to prn care  Patient reports that he is doing well He is doing lasix 160mg  AM and 80mg  PM He has been very itchy of recent, back and legs, worse at night, not letting him sleep Trazodone not helping, very stressed about his recent health changes Breathing is better, able to walk more wo getting so winded Requesting refill of tramadol which he takes very prn for his back pain, s/p surgery, pmp reviewed Sees renal in 3 weeks     Depression screen [facility] 2/9 03/03/2019 01/26/2019 12/17/2018  Decreased Interest 0 0 0  Down, Depressed, Hopeless 0 0 0  PHQ - 2 Score 0 0 0  Some recent data might be hidden    Fall Risk  03/03/2019 01/26/2019 12/17/2018 12/12/2018 12/09/2018  Falls in the past year? 0 0 0 0 0  Number falls in past yr: 0 0 0 0 -  Injury with Fall? 0 0 0 0 -  Comment - - - - -  Risk for fall due to : - - - - -  Follow up - - - Falls evaluation completed Falls evaluation completed     Allergies  Allergen Reactions  . Codeine Phosphate Other (See Comments)    Hyperactive     Prior to Admission medications   Medication Sig Start Date End Date Taking? Authorizing Provider  acetaminophen (TYLENOL) 500 MG tablet Take 1,000 mg by mouth 2 (two)  times a day. Takes 1000mg  in the morning and 1000mg  in the evening.    Yes [provider]  amLODipine (NORVASC) 10 MG tablet Take 1 tablet (10 mg total) by mouth daily. 02/11/18  Yes Weber, Sarah L, PA-C  CALCIUM CITRATE-VITAMIN D PO Take 1 tablet by mouth daily.    Yes [provider]  carvedilol (COREG) 25 MG tablet Take 25 mg by mouth every morning.  11/22/17  Yes [provider]  cetirizine (ZYRTEC) 10 MG tablet Take 10 mg by mouth daily as needed for allergies.    Yes [provider]  diphenhydramine-acetaminophen (TYLENOL PM) 25-500 MG TABS tablet Take 1 tablet by mouth at bedtime as needed (sleep).   Yes [provider]  Ferrous Sulfate (IRON) 325 (65 Fe) MG TABS Take 1 tablet by mouth daily at 6 (six) AM.   Yes [provider]  fluticasone (FLONASE) 50 MCG/ACT nasal spray Place 2 sprays into both nostrils daily. 09/16/18  Yes [name], MD  furosemide (LASIX) 80 MG tablet Take 2 tablets (160 mg total) by mouth 2 (two) times daily. 02/05/19 03/07/19 Yes Sherwood Gambler, MD  Multiple Vitamins-Minerals (ONE-A-DAY MENS 50+ ADVANTAGE PO) Take 1 tablet by mouth daily.   Yes [provider]  rosuvastatin (CRESTOR) 5 MG tablet Take 1 tablet (5 mg total) by mouth daily. Patient taking differently: Take 5 mg by mouth See admin instructions. Every 3 days 08/09/17  Yes Weber, Sarah L, PA-C  sodium bicarbonate 650 MG tablet Take 1 tablet (650 mg total) by mouth 2 (two) times daily. Patient taking differently: Take 1,300 mg by mouth 2 (two) times daily.  02/11/18  Yes Weber, Sarah L, PA-C  terazosin (HYTRIN) 1 MG capsule Take 2 capsules (2 mg total) by mouth at bedtime. 02/09/19  Yes [name], MD  traZODone (DESYREL) 50 MG tablet Take 0.5-1 tablets (25-50 mg total) by mouth at bedtime as needed for sleep. 01/26/19  Yes [name], MD  ULORIC 40 MG tablet Take 40 mg by mouth every other day.  11/06/16  Yes [provider]     Past Medical History:  Diagnosis Date  . Cervical disc disease   . Chronic kidney disease   . COLONIC POLYPS, HX OF 06/27/2007  . EXOGENOUS OBESITY 01/30/2010  . GERD (gastroesophageal reflux disease)    PMH  . GOUT 01/30/2010  . HYPERCHOLESTEROLEMIA 06/30/2007  . HYPERTENSION 06/27/2007  . LOW BACK PAIN 06/27/2007  . NEPHROLITHIASIS, HX OF 06/27/2007  . OSTEOARTHRITIS 06/27/2007  . SLEEP APNEA, OBSTRUCTIVE, MODERATE 01/27/2009  . Wears dentures    upper    Past Surgical History:  Procedure Laterality Date  . AV FISTULA PLACEMENT Right 01/19/2019   Procedure: RIGHT BRACHIOCEPHALIC ARTERIOVENOUS (AV) FISTULA CREATION;  Surgeon: Angelia Mould, MD;  Location: [facility];  Service: Vascular;  Laterality: Right;  . CARDIAC CATHETERIZATION    . CARPAL TUNNEL RELEASE     left hand  . COLONOSCOPY     with polypectomy  . JOINT REPLACEMENT Left 2005   knee  . LUMBAR LAMINECTOMY    . MULTIPLE TOOTH EXTRACTIONS    . RADIOFREQUENCY ABLATION KIDNEY    . TOTAL KNEE ARTHROPLASTY     left    Social History   Tobacco Use  . Smoking status: Former Smoker    Quit date: 07/23/1980    Years since quitting: 38.6  . Smokeless tobacco: Never Used  Substance Use Topics  . Alcohol use: Yes    Alcohol/week: 1.0 standard drinks    Types: 1 Cans of beer per week    Comment: rare    Family History  Problem Relation Age of Onset  . Hypertension Other   . Diabetes Sister   . Hyperlipidemia Sister   . Sleep apnea Sister     Review of Systems  Constitutional: Negative for chills and fever.  Respiratory: Positive for shortness of breath (improved). Negative for cough.   Cardiovascular: Negative for chest pain, palpitations and leg swelling.  Gastrointestinal: Negative for abdominal pain, nausea and vomiting.  Musculoskeletal: Positive for back pain.  Skin: Positive for itching. Negative for rash.  Psychiatric/Behavioral: The patient has insomnia.      OBJECTIVE:  Today's Vitals    03/03/19 0846  BP: 114/60  Pulse: 95  Temp: 98.2 F (36.8 C)  TempSrc: Oral  SpO2: 99%  Weight: 298 lb (135.2 kg)  Height: 6' (1.829 m)   Body mass index is 40.42 kg/m.  Wt Readings from Last 3 Encounters:  03/03/19 298 lb (135.2 kg)  03/02/19 299 lb (135.6 kg)  02/05/19 300 lb (136.1 kg)     Physical Exam Vitals signs and nursing note reviewed.  Constitutional:      Appearance: He is well-developed.  HENT:     Head: Normocephalic and atraumatic.  Eyes:     Conjunctiva/sclera: Conjunctivae normal.     Pupils: Pupils are equal, round, and reactive to light.  Neck:     Musculoskeletal: Neck supple.  Cardiovascular:     Rate and Rhythm: Normal rate and regular rhythm.     Heart sounds: No murmur. No friction rub. No gallop.   Pulmonary:     Effort: Pulmonary effort is normal.     Breath sounds: Normal breath sounds. No wheezing or rales.  Skin:    General: Skin is warm and dry.     Findings: No rash.  Neurological:     Mental Status: He is alert and oriented to person, place, and time.     ASSESSMENT and PLAN  1. Stage 5 chronic kidney disease not on chronic dialysis (HCC) Fluid status much improved, has upcoming appt with renal.  2. Uremic pruritus Explained diagnosis. Trial of vistaril.   3. Essential hypertension Controlled. Continue current regime.  - amLODipine (NORVASC) 10 MG tablet; Take 1 tablet (10 mg total) by mouth daily.  4. Bilateral low back pain without sciatica, unspecified chronicity pmp reviewed. Takes very prn, no nsaids, tramadol refilled  Other orders - hydrOXYzine (ATARAX/VISTARIL) 25 MG tablet; Take 1 tablet (25 mg total) by mouth 3 (three) times daily as needed for itching (insomnia). - traMADol (ULTRAM) 50 MG tablet; Take 1 tablet (50 mg total) by mouth every 12 (twelve) hours as needed for up to 5 days.  No follow-ups on file.    [name], MD [facility] at [place], [place] [address] Ph.   [phone] Fax [phone]

## 2019-03-12 ENCOUNTER — Other Ambulatory Visit: Payer: Self-pay | Admitting: Family Medicine

## 2019-03-17 ENCOUNTER — Other Ambulatory Visit: Payer: Self-pay | Admitting: Family Medicine

## 2019-04-02 ENCOUNTER — Other Ambulatory Visit: Payer: Self-pay | Admitting: Family Medicine

## 2019-04-02 NOTE — Telephone Encounter (Signed)
Requested medication (s) are due for refill today: yes  Requested medication (s) are on the active medication list: yes  Last refill:  03/18/2019  Future visit scheduled: yes  Notes to clinic: review for refill   Requested Prescriptions  Pending Prescriptions Disp Refills   hydrOXYzine (ATARAX/VISTARIL) 25 MG tablet [Pharmacy Med Name: HYDROXYZINE HCL 25 MG TABLET] 30 tablet 0    Sig: TAKE 1 TABLET (25 MG TOTAL) BY MOUTH 3 (THREE) TIMES DAILY AS NEEDED FOR ITCHING (INSOMNIA).     Ear, Nose, and Throat:  Antihistamines Passed - 04/02/2019  1:09 PM      Passed - Valid encounter within last 12 months    Recent Outpatient Visits          1 month ago Stage 5 chronic kidney disease not on chronic dialysis [facility])   [facility], Lilia Argue, MD   2 months ago CKD (chronic kidney disease) stage 5, GFR less than 15 ml/min [facility])   [facility], Lilia Argue, MD   2 months ago DOE (dyspnea on exertion)   [facility], Lilia Argue, MD   3 months ago Right leg swelling   [facility], Ranell Patrick, MD   3 months ago Restless legs   [facility], Lilia Argue, MD      Future Appointments            In 2 months [name], MD [facility] at [place], [facility]

## 2019-04-09 DIAGNOSIS — D689 Coagulation defect, unspecified: Secondary | ICD-10-CM | POA: Insufficient documentation

## 2019-04-09 DIAGNOSIS — Z23 Encounter for immunization: Secondary | ICD-10-CM | POA: Insufficient documentation

## 2019-04-09 DIAGNOSIS — G4700 Insomnia, unspecified: Secondary | ICD-10-CM | POA: Insufficient documentation

## 2019-04-09 DIAGNOSIS — D509 Iron deficiency anemia, unspecified: Secondary | ICD-10-CM | POA: Insufficient documentation

## 2019-04-09 DIAGNOSIS — R52 Pain, unspecified: Secondary | ICD-10-CM | POA: Insufficient documentation

## 2019-04-09 DIAGNOSIS — N529 Male erectile dysfunction, unspecified: Secondary | ICD-10-CM | POA: Insufficient documentation

## 2019-04-16 ENCOUNTER — Inpatient Hospital Stay (HOSPITAL_COMMUNITY): Payer: Medicare Other

## 2019-04-16 ENCOUNTER — Emergency Department (HOSPITAL_COMMUNITY): Payer: Medicare Other | Admitting: Certified Registered"

## 2019-04-16 ENCOUNTER — Emergency Department (HOSPITAL_COMMUNITY): Payer: Medicare Other

## 2019-04-16 ENCOUNTER — Inpatient Hospital Stay (HOSPITAL_COMMUNITY)
Admission: EM | Disposition: A | Discharge status: Rehabilitation Facility | Payer: Self-pay | Source: Home / Self Care | Attending: Thoracic Surgery (Cardiothoracic Vascular Surgery)

## 2019-04-16 ENCOUNTER — Inpatient Hospital Stay (HOSPITAL_COMMUNITY)
Admission: EM | Admit: 2019-04-16 | Discharge: 2019-04-30 | DRG: 215 | Disposition: A | Discharge status: Rehabilitation Facility | Payer: Medicare Other | Attending: Thoracic Surgery (Cardiothoracic Vascular Surgery) | Admitting: Thoracic Surgery (Cardiothoracic Vascular Surgery)

## 2019-04-16 ENCOUNTER — Encounter (HOSPITAL_COMMUNITY): Payer: Self-pay

## 2019-04-16 ENCOUNTER — Encounter (HOSPITAL_COMMUNITY)
Admission: EM | Disposition: A | Discharge status: Rehabilitation Facility | Payer: Self-pay | Source: Home / Self Care | Attending: Thoracic Surgery (Cardiothoracic Vascular Surgery)

## 2019-04-16 ENCOUNTER — Other Ambulatory Visit: Payer: Self-pay

## 2019-04-16 DIAGNOSIS — Z833 Family history of diabetes mellitus: Secondary | ICD-10-CM | POA: Diagnosis not present

## 2019-04-16 DIAGNOSIS — N179 Acute kidney failure, unspecified: Secondary | ICD-10-CM

## 2019-04-16 DIAGNOSIS — Z23 Encounter for immunization: Secondary | ICD-10-CM

## 2019-04-16 DIAGNOSIS — R570 Cardiogenic shock: Secondary | ICD-10-CM

## 2019-04-16 DIAGNOSIS — R079 Chest pain, unspecified: Secondary | ICD-10-CM | POA: Diagnosis present

## 2019-04-16 DIAGNOSIS — N2581 Secondary hyperparathyroidism of renal origin: Secondary | ICD-10-CM | POA: Diagnosis present

## 2019-04-16 DIAGNOSIS — I484 Atypical atrial flutter: Secondary | ICD-10-CM | POA: Diagnosis present

## 2019-04-16 DIAGNOSIS — E785 Hyperlipidemia, unspecified: Secondary | ICD-10-CM | POA: Diagnosis present

## 2019-04-16 DIAGNOSIS — I2102 ST elevation (STEMI) myocardial infarction involving left anterior descending coronary artery: Secondary | ICD-10-CM

## 2019-04-16 DIAGNOSIS — Z8249 Family history of ischemic heart disease and other diseases of the circulatory system: Secondary | ICD-10-CM

## 2019-04-16 DIAGNOSIS — Z7951 Long term (current) use of inhaled steroids: Secondary | ICD-10-CM | POA: Diagnosis not present

## 2019-04-16 DIAGNOSIS — I480 Paroxysmal atrial fibrillation: Secondary | ICD-10-CM | POA: Diagnosis not present

## 2019-04-16 DIAGNOSIS — I483 Typical atrial flutter: Secondary | ICD-10-CM

## 2019-04-16 DIAGNOSIS — Y832 Surgical operation with anastomosis, bypass or graft as the cause of abnormal reaction of the patient, or of later complication, without mention of misadventure at the time of the procedure: Secondary | ICD-10-CM | POA: Diagnosis not present

## 2019-04-16 DIAGNOSIS — I2510 Atherosclerotic heart disease of native coronary artery without angina pectoris: Secondary | ICD-10-CM | POA: Diagnosis present

## 2019-04-16 DIAGNOSIS — N186 End stage renal disease: Secondary | ICD-10-CM | POA: Diagnosis present

## 2019-04-16 DIAGNOSIS — T82510A Breakdown (mechanical) of surgically created arteriovenous fistula, initial encounter: Secondary | ICD-10-CM | POA: Diagnosis not present

## 2019-04-16 DIAGNOSIS — I953 Hypotension of hemodialysis: Secondary | ICD-10-CM

## 2019-04-16 DIAGNOSIS — M109 Gout, unspecified: Secondary | ICD-10-CM | POA: Diagnosis present

## 2019-04-16 DIAGNOSIS — D62 Acute posthemorrhagic anemia: Secondary | ICD-10-CM | POA: Diagnosis not present

## 2019-04-16 DIAGNOSIS — Z20828 Contact with and (suspected) exposure to other viral communicable diseases: Secondary | ICD-10-CM | POA: Diagnosis present

## 2019-04-16 DIAGNOSIS — I340 Nonrheumatic mitral (valve) insufficiency: Secondary | ICD-10-CM | POA: Diagnosis not present

## 2019-04-16 DIAGNOSIS — Z992 Dependence on renal dialysis: Secondary | ICD-10-CM

## 2019-04-16 DIAGNOSIS — Z87891 Personal history of nicotine dependence: Secondary | ICD-10-CM

## 2019-04-16 DIAGNOSIS — I2109 ST elevation (STEMI) myocardial infarction involving other coronary artery of anterior wall: Principal | ICD-10-CM

## 2019-04-16 DIAGNOSIS — Z951 Presence of aortocoronary bypass graft: Secondary | ICD-10-CM

## 2019-04-16 DIAGNOSIS — Z96652 Presence of left artificial knee joint: Secondary | ICD-10-CM | POA: Diagnosis present

## 2019-04-16 DIAGNOSIS — Z452 Encounter for adjustment and management of vascular access device: Secondary | ICD-10-CM

## 2019-04-16 DIAGNOSIS — I213 ST elevation (STEMI) myocardial infarction of unspecified site: Secondary | ICD-10-CM

## 2019-04-16 DIAGNOSIS — E7800 Pure hypercholesterolemia, unspecified: Secondary | ICD-10-CM | POA: Diagnosis present

## 2019-04-16 DIAGNOSIS — Z6841 Body Mass Index (BMI) 40.0 and over, adult: Secondary | ICD-10-CM

## 2019-04-16 DIAGNOSIS — Z419 Encounter for procedure for purposes other than remedying health state, unspecified: Secondary | ICD-10-CM

## 2019-04-16 DIAGNOSIS — Z95811 Presence of heart assist device: Secondary | ICD-10-CM

## 2019-04-16 DIAGNOSIS — K219 Gastro-esophageal reflux disease without esophagitis: Secondary | ICD-10-CM | POA: Diagnosis present

## 2019-04-16 DIAGNOSIS — I361 Nonrheumatic tricuspid (valve) insufficiency: Secondary | ICD-10-CM | POA: Diagnosis not present

## 2019-04-16 DIAGNOSIS — D631 Anemia in chronic kidney disease: Secondary | ICD-10-CM | POA: Diagnosis present

## 2019-04-16 DIAGNOSIS — K644 Residual hemorrhoidal skin tags: Secondary | ICD-10-CM | POA: Diagnosis present

## 2019-04-16 DIAGNOSIS — D696 Thrombocytopenia, unspecified: Secondary | ICD-10-CM | POA: Diagnosis present

## 2019-04-16 DIAGNOSIS — R5381 Other malaise: Secondary | ICD-10-CM | POA: Diagnosis not present

## 2019-04-16 DIAGNOSIS — Z4659 Encounter for fitting and adjustment of other gastrointestinal appliance and device: Secondary | ICD-10-CM

## 2019-04-16 DIAGNOSIS — T82590A Other mechanical complication of surgically created arteriovenous fistula, initial encounter: Secondary | ICD-10-CM | POA: Diagnosis not present

## 2019-04-16 DIAGNOSIS — I4891 Unspecified atrial fibrillation: Secondary | ICD-10-CM | POA: Diagnosis not present

## 2019-04-16 DIAGNOSIS — J939 Pneumothorax, unspecified: Secondary | ICD-10-CM

## 2019-04-16 DIAGNOSIS — Z95828 Presence of other vascular implants and grafts: Secondary | ICD-10-CM

## 2019-04-16 DIAGNOSIS — I5023 Acute on chronic systolic (congestive) heart failure: Secondary | ICD-10-CM | POA: Diagnosis present

## 2019-04-16 DIAGNOSIS — D649 Anemia, unspecified: Secondary | ICD-10-CM | POA: Diagnosis not present

## 2019-04-16 DIAGNOSIS — Z79899 Other long term (current) drug therapy: Secondary | ICD-10-CM | POA: Diagnosis not present

## 2019-04-16 DIAGNOSIS — Z09 Encounter for follow-up examination after completed treatment for conditions other than malignant neoplasm: Secondary | ICD-10-CM

## 2019-04-16 DIAGNOSIS — I25110 Atherosclerotic heart disease of native coronary artery with unstable angina pectoris: Secondary | ICD-10-CM

## 2019-04-16 DIAGNOSIS — I255 Ischemic cardiomyopathy: Secondary | ICD-10-CM | POA: Diagnosis present

## 2019-04-16 HISTORY — PX: CORONARY ARTERY BYPASS GRAFT: SHX141

## 2019-04-16 HISTORY — PX: TEE WITHOUT CARDIOVERSION: SHX5443

## 2019-04-16 HISTORY — PX: VENTRICULAR ASSIST DEVICE INSERTION: CATH118273

## 2019-04-16 HISTORY — PX: CORONARY/GRAFT ACUTE MI REVASCULARIZATION: CATH118305

## 2019-04-16 HISTORY — PX: RIGHT/LEFT HEART CATH AND CORONARY ANGIOGRAPHY: CATH118266

## 2019-04-16 LAB — POCT I-STAT, CHEM 8
BUN: 58 mg/dL — ABNORMAL HIGH (ref 8–23)
BUN: 62 mg/dL — ABNORMAL HIGH (ref 8–23)
BUN: 51 mg/dL — ABNORMAL HIGH (ref 8–23)
BUN: 52 mg/dL — ABNORMAL HIGH (ref 8–23)
BUN: 48 mg/dL — ABNORMAL HIGH (ref 8–23)
BUN: 47 mg/dL — ABNORMAL HIGH (ref 8–23)
BUN: 52 mg/dL — ABNORMAL HIGH (ref 8–23)
BUN: 53 mg/dL — ABNORMAL HIGH (ref 8–23)
Calcium, Ion: 1.79 mmol/L (ref 1.15–1.40)
Calcium, Ion: 1.54 mmol/L (ref 1.15–1.40)
Calcium, Ion: 1.32 mmol/L (ref 1.15–1.40)
Calcium, Ion: 1.34 mmol/L (ref 1.15–1.40)
Calcium, Ion: 1.31 mmol/L (ref 1.15–1.40)
Calcium, Ion: 1.47 mmol/L — ABNORMAL HIGH (ref 1.15–1.40)
Calcium, Ion: 1.69 mmol/L (ref 1.15–1.40)
Calcium, Ion: 1.31 mmol/L (ref 1.15–1.40)
Chloride: 101 mmol/L (ref 98–111)
Chloride: 102 mmol/L (ref 98–111)
Chloride: 102 mmol/L (ref 98–111)
Chloride: 105 mmol/L (ref 98–111)
Chloride: 108 mmol/L (ref 98–111)
Chloride: 108 mmol/L (ref 98–111)
Chloride: 111 mmol/L (ref 98–111)
Chloride: 108 mmol/L (ref 98–111)
Creatinine, Ser: 6.9 mg/dL — ABNORMAL HIGH (ref 0.61–1.24)
Creatinine, Ser: 7.1 mg/dL — ABNORMAL HIGH (ref 0.61–1.24)
Creatinine, Ser: 6.4 mg/dL — ABNORMAL HIGH (ref 0.61–1.24)
Creatinine, Ser: 6.5 mg/dL — ABNORMAL HIGH (ref 0.61–1.24)
Creatinine, Ser: 6.1 mg/dL — ABNORMAL HIGH (ref 0.61–1.24)
Creatinine, Ser: 5.7 mg/dL — ABNORMAL HIGH (ref 0.61–1.24)
Creatinine, Ser: 5.8 mg/dL — ABNORMAL HIGH (ref 0.61–1.24)
Creatinine, Ser: 5.9 mg/dL — ABNORMAL HIGH (ref 0.61–1.24)
Glucose, Bld: 170 mg/dL — ABNORMAL HIGH (ref 70–99)
Glucose, Bld: 166 mg/dL — ABNORMAL HIGH (ref 70–99)
Glucose, Bld: 157 mg/dL — ABNORMAL HIGH (ref 70–99)
Glucose, Bld: 159 mg/dL — ABNORMAL HIGH (ref 70–99)
Glucose, Bld: 163 mg/dL — ABNORMAL HIGH (ref 70–99)
Glucose, Bld: 139 mg/dL — ABNORMAL HIGH (ref 70–99)
Glucose, Bld: 160 mg/dL — ABNORMAL HIGH (ref 70–99)
Glucose, Bld: 144 mg/dL — ABNORMAL HIGH (ref 70–99)
HCT: 26 % — ABNORMAL LOW (ref 39.0–52.0)
HCT: 21 % — ABNORMAL LOW (ref 39.0–52.0)
HCT: 23 % — ABNORMAL LOW (ref 39.0–52.0)
HCT: 27 % — ABNORMAL LOW (ref 39.0–52.0)
HCT: 26 % — ABNORMAL LOW (ref 39.0–52.0)
HCT: 22 % — ABNORMAL LOW (ref 39.0–52.0)
HCT: 22 % — ABNORMAL LOW (ref 39.0–52.0)
HCT: 26 % — ABNORMAL LOW (ref 39.0–52.0)
Hemoglobin: 8.8 g/dL — ABNORMAL LOW (ref 13.0–17.0)
Hemoglobin: 7.1 g/dL — ABNORMAL LOW (ref 13.0–17.0)
Hemoglobin: 7.8 g/dL — ABNORMAL LOW (ref 13.0–17.0)
Hemoglobin: 9.2 g/dL — ABNORMAL LOW (ref 13.0–17.0)
Hemoglobin: 8.8 g/dL — ABNORMAL LOW (ref 13.0–17.0)
Hemoglobin: 7.5 g/dL — ABNORMAL LOW (ref 13.0–17.0)
Hemoglobin: 7.5 g/dL — ABNORMAL LOW (ref 13.0–17.0)
Hemoglobin: 8.8 g/dL — ABNORMAL LOW (ref 13.0–17.0)
Potassium: 4 mmol/L (ref 3.5–5.1)
Potassium: 3.5 mmol/L (ref 3.5–5.1)
Potassium: 3.6 mmol/L (ref 3.5–5.1)
Potassium: 3.8 mmol/L (ref 3.5–5.1)
Potassium: 3.7 mmol/L (ref 3.5–5.1)
Potassium: 3.3 mmol/L — ABNORMAL LOW (ref 3.5–5.1)
Potassium: 3.4 mmol/L — ABNORMAL LOW (ref 3.5–5.1)
Potassium: 3.7 mmol/L (ref 3.5–5.1)
Sodium: 136 mmol/L (ref 135–145)
Sodium: 137 mmol/L (ref 135–145)
Sodium: 140 mmol/L (ref 135–145)
Sodium: 140 mmol/L (ref 135–145)
Sodium: 143 mmol/L (ref 135–145)
Sodium: 144 mmol/L (ref 135–145)
Sodium: 144 mmol/L (ref 135–145)
Sodium: 144 mmol/L (ref 135–145)
TCO2: 22 mmol/L (ref 22–32)
TCO2: 22 mmol/L (ref 22–32)
TCO2: 22 mmol/L (ref 22–32)
TCO2: 24 mmol/L (ref 22–32)
TCO2: 21 mmol/L — ABNORMAL LOW (ref 22–32)
TCO2: 21 mmol/L — ABNORMAL LOW (ref 22–32)
TCO2: 23 mmol/L (ref 22–32)
TCO2: 20 mmol/L — ABNORMAL LOW (ref 22–32)

## 2019-04-16 LAB — LIPID PANEL
Cholesterol: 201 mg/dL — ABNORMAL HIGH (ref 0–200)
HDL: 23 mg/dL — ABNORMAL LOW (ref >40)
LDL Cholesterol: 142 mg/dL — ABNORMAL HIGH (ref 0–99)
Total CHOL/HDL Ratio: 8.7 RATIO
Triglycerides: 178 mg/dL — ABNORMAL HIGH (ref <150)
VLDL: 36 mg/dL (ref 0–40)

## 2019-04-16 LAB — POCT I-STAT 7, (LYTES, BLD GAS, ICA,H+H)
Acid-base deficit: 1 mmol/L (ref 0.0–2.0)
Acid-base deficit: 3 mmol/L — ABNORMAL HIGH (ref 0.0–2.0)
Acid-base deficit: 3 mmol/L — ABNORMAL HIGH (ref 0.0–2.0)
Acid-base deficit: 4 mmol/L — ABNORMAL HIGH (ref 0.0–2.0)
Acid-base deficit: 4 mmol/L — ABNORMAL HIGH (ref 0.0–2.0)
Acid-base deficit: 6 mmol/L — ABNORMAL HIGH (ref 0.0–2.0)
Acid-base deficit: 7 mmol/L — ABNORMAL HIGH (ref 0.0–2.0)
Bicarbonate: 19.3 mmol/L — ABNORMAL LOW (ref 20.0–28.0)
Bicarbonate: 19.9 mmol/L — ABNORMAL LOW (ref 20.0–28.0)
Bicarbonate: 20.6 mmol/L (ref 20.0–28.0)
Bicarbonate: 21.2 mmol/L (ref 20.0–28.0)
Bicarbonate: 21.2 mmol/L (ref 20.0–28.0)
Bicarbonate: 23.7 mmol/L (ref 20.0–28.0)
Bicarbonate: 24 mmol/L (ref 20.0–28.0)
Calcium, Ion: 1.33 mmol/L (ref 1.15–1.40)
Calcium, Ion: 1.34 mmol/L (ref 1.15–1.40)
Calcium, Ion: 1.36 mmol/L (ref 1.15–1.40)
Calcium, Ion: 1.42 mmol/L — ABNORMAL HIGH (ref 1.15–1.40)
Calcium, Ion: 1.46 mmol/L — ABNORMAL HIGH (ref 1.15–1.40)
Calcium, Ion: 1.46 mmol/L — ABNORMAL HIGH (ref 1.15–1.40)
Calcium, Ion: 1.82 mmol/L (ref 1.15–1.40)
HCT: 22 % — ABNORMAL LOW (ref 39.0–52.0)
HCT: 23 % — ABNORMAL LOW (ref 39.0–52.0)
HCT: 23 % — ABNORMAL LOW (ref 39.0–52.0)
HCT: 24 % — ABNORMAL LOW (ref 39.0–52.0)
HCT: 24 % — ABNORMAL LOW (ref 39.0–52.0)
HCT: 24 % — ABNORMAL LOW (ref 39.0–52.0)
HCT: 24 % — ABNORMAL LOW (ref 39.0–52.0)
Hemoglobin: 7.5 g/dL — ABNORMAL LOW (ref 13.0–17.0)
Hemoglobin: 7.8 g/dL — ABNORMAL LOW (ref 13.0–17.0)
Hemoglobin: 7.8 g/dL — ABNORMAL LOW (ref 13.0–17.0)
Hemoglobin: 8.2 g/dL — ABNORMAL LOW (ref 13.0–17.0)
Hemoglobin: 8.2 g/dL — ABNORMAL LOW (ref 13.0–17.0)
Hemoglobin: 8.2 g/dL — ABNORMAL LOW (ref 13.0–17.0)
Hemoglobin: 8.2 g/dL — ABNORMAL LOW (ref 13.0–17.0)
O2 Saturation: 100 %
O2 Saturation: 100 %
O2 Saturation: 86 %
O2 Saturation: 87 %
O2 Saturation: 94 %
O2 Saturation: 95 %
O2 Saturation: 97 %
Patient temperature: 38
Potassium: 3.3 mmol/L — ABNORMAL LOW (ref 3.5–5.1)
Potassium: 3.3 mmol/L — ABNORMAL LOW (ref 3.5–5.1)
Potassium: 3.4 mmol/L — ABNORMAL LOW (ref 3.5–5.1)
Potassium: 3.8 mmol/L (ref 3.5–5.1)
Potassium: 3.9 mmol/L (ref 3.5–5.1)
Potassium: 4 mmol/L (ref 3.5–5.1)
Potassium: 4 mmol/L (ref 3.5–5.1)
Sodium: 136 mmol/L (ref 135–145)
Sodium: 137 mmol/L (ref 135–145)
Sodium: 138 mmol/L (ref 135–145)
Sodium: 139 mmol/L (ref 135–145)
Sodium: 140 mmol/L (ref 135–145)
Sodium: 144 mmol/L (ref 135–145)
Sodium: 145 mmol/L (ref 135–145)
TCO2: 20 mmol/L — ABNORMAL LOW (ref 22–32)
TCO2: 21 mmol/L — ABNORMAL LOW (ref 22–32)
TCO2: 21 mmol/L — ABNORMAL LOW (ref 22–32)
TCO2: 22 mmol/L (ref 22–32)
TCO2: 22 mmol/L (ref 22–32)
TCO2: 25 mmol/L (ref 22–32)
TCO2: 25 mmol/L (ref 22–32)
pCO2 arterial: 28.3 mmHg — ABNORMAL LOW (ref 32.0–48.0)
pCO2 arterial: 32.6 mmHg (ref 32.0–48.0)
pCO2 arterial: 39.3 mmHg (ref 32.0–48.0)
pCO2 arterial: 39.6 mmHg (ref 32.0–48.0)
pCO2 arterial: 40.8 mmHg (ref 32.0–48.0)
pCO2 arterial: 41.6 mmHg (ref 32.0–48.0)
pCO2 arterial: 55 mmHg — ABNORMAL HIGH (ref 32.0–48.0)
pH, Arterial: 7.242 — ABNORMAL LOW (ref 7.350–7.450)
pH, Arterial: 7.282 — ABNORMAL LOW (ref 7.350–7.450)
pH, Arterial: 7.312 — ABNORMAL LOW (ref 7.350–7.450)
pH, Arterial: 7.341 — ABNORMAL LOW (ref 7.350–7.450)
pH, Arterial: 7.37 (ref 7.350–7.450)
pH, Arterial: 7.421 (ref 7.350–7.450)
pH, Arterial: 7.47 — ABNORMAL HIGH (ref 7.350–7.450)
pO2, Arterial: 211 mmHg — ABNORMAL HIGH (ref 83.0–108.0)
pO2, Arterial: 296 mmHg — ABNORMAL HIGH (ref 83.0–108.0)
pO2, Arterial: 58 mmHg — ABNORMAL LOW (ref 83.0–108.0)
pO2, Arterial: 58 mmHg — ABNORMAL LOW (ref 83.0–108.0)
pO2, Arterial: 71 mmHg — ABNORMAL LOW (ref 83.0–108.0)
pO2, Arterial: 84 mmHg (ref 83.0–108.0)
pO2, Arterial: 89 mmHg (ref 83.0–108.0)

## 2019-04-16 LAB — POCT I-STAT EG7
Acid-base deficit: 1 mmol/L (ref 0.0–2.0)
Bicarbonate: 23.5 mmol/L (ref 20.0–28.0)
Calcium, Ion: 1.44 mmol/L — ABNORMAL HIGH (ref 1.15–1.40)
HCT: 25 % — ABNORMAL LOW (ref 39.0–52.0)
Hemoglobin: 8.5 g/dL — ABNORMAL LOW (ref 13.0–17.0)
O2 Saturation: 58 %
Potassium: 3.5 mmol/L (ref 3.5–5.1)
Sodium: 137 mmol/L (ref 135–145)
TCO2: 25 mmol/L (ref 22–32)
pCO2, Ven: 35.8 mmHg — ABNORMAL LOW (ref 44.0–60.0)
pH, Ven: 7.425 (ref 7.250–7.430)
pO2, Ven: 29 mmHg — CL (ref 32.0–45.0)

## 2019-04-16 LAB — CBC
HCT: 25.3 % — ABNORMAL LOW (ref 39.0–52.0)
HCT: 25.5 % — ABNORMAL LOW (ref 39.0–52.0)
Hemoglobin: 8 g/dL — ABNORMAL LOW (ref 13.0–17.0)
Hemoglobin: 8.5 g/dL — ABNORMAL LOW (ref 13.0–17.0)
MCH: 26.8 pg (ref 26.0–34.0)
MCH: 28.8 pg (ref 26.0–34.0)
MCHC: 31.6 g/dL (ref 30.0–36.0)
MCHC: 33.3 g/dL (ref 30.0–36.0)
MCV: 84.6 fL (ref 80.0–100.0)
MCV: 86.4 fL (ref 80.0–100.0)
Platelets: 210 10*3/uL (ref 150–400)
Platelets: 126 10*3/uL — ABNORMAL LOW (ref 150–400)
RBC: 2.99 MIL/uL — ABNORMAL LOW (ref 4.22–5.81)
RBC: 2.95 MIL/uL — ABNORMAL LOW (ref 4.22–5.81)
RDW: 15.4 % (ref 11.5–15.5)
RDW: 14.8 % (ref 11.5–15.5)
WBC: 8.8 10*3/uL (ref 4.0–10.5)
WBC: 24.1 10*3/uL — ABNORMAL HIGH (ref 4.0–10.5)
nRBC: 0 % (ref 0.0–0.2)
nRBC: 0 % (ref 0.0–0.2)

## 2019-04-16 LAB — PROTIME-INR
INR: 1.2 (ref 0.8–1.2)
INR: 1.5 — ABNORMAL HIGH (ref 0.8–1.2)
Prothrombin Time: 15 seconds (ref 11.4–15.2)
Prothrombin Time: 18.1 seconds — ABNORMAL HIGH (ref 11.4–15.2)

## 2019-04-16 LAB — BASIC METABOLIC PANEL
Anion gap: 17 — ABNORMAL HIGH (ref 5–15)
BUN: 58 mg/dL — ABNORMAL HIGH (ref 8–23)
CO2: 22 mmol/L (ref 22–32)
Calcium: 11.1 mg/dL — ABNORMAL HIGH (ref 8.9–10.3)
Chloride: 101 mmol/L (ref 98–111)
Creatinine, Ser: 7.54 mg/dL — ABNORMAL HIGH (ref 0.61–1.24)
GFR calc Af Amer: 8 mL/min — ABNORMAL LOW (ref >60)
GFR calc non Af Amer: 7 mL/min — ABNORMAL LOW (ref >60)
Glucose, Bld: 121 mg/dL — ABNORMAL HIGH (ref 70–99)
Potassium: 3.3 mmol/L — ABNORMAL LOW (ref 3.5–5.1)
Sodium: 140 mmol/L (ref 135–145)

## 2019-04-16 LAB — APTT
aPTT: 28 seconds (ref 24–36)
aPTT: 37 seconds — ABNORMAL HIGH (ref 24–36)

## 2019-04-16 LAB — LACTATE DEHYDROGENASE: LDH: 626 U/L — ABNORMAL HIGH (ref 98–192)

## 2019-04-16 LAB — ECHOCARDIOGRAM LIMITED
Height: 72 in
Weight: 4544 oz

## 2019-04-16 LAB — TROPONIN I (HIGH SENSITIVITY): Troponin I (High Sensitivity): 1297 ng/L (ref <18)

## 2019-04-16 LAB — GLUCOSE, CAPILLARY
Glucose-Capillary: 102 mg/dL — ABNORMAL HIGH (ref 70–99)
Glucose-Capillary: 134 mg/dL — ABNORMAL HIGH (ref 70–99)
Glucose-Capillary: 92 mg/dL (ref 70–99)
Glucose-Capillary: 97 mg/dL (ref 70–99)
Glucose-Capillary: 99 mg/dL (ref 70–99)

## 2019-04-16 LAB — HEMOGLOBIN AND HEMATOCRIT, BLOOD
HCT: 24.8 % — ABNORMAL LOW (ref 39.0–52.0)
Hemoglobin: 8.2 g/dL — ABNORMAL LOW (ref 13.0–17.0)

## 2019-04-16 LAB — PLATELET COUNT: Platelets: 190 10*3/uL (ref 150–400)

## 2019-04-16 LAB — POCT ACTIVATED CLOTTING TIME
Activated Clotting Time: 208 seconds
Activated Clotting Time: 235 seconds

## 2019-04-16 LAB — ECHO INTRAOPERATIVE TEE
Height: 72 in
Weight: 4544 oz

## 2019-04-16 LAB — SARS CORONAVIRUS 2 BY RT PCR (HOSPITAL ORDER, PERFORMED IN ~~LOC~~ HOSPITAL LAB): SARS Coronavirus 2: NEGATIVE

## 2019-04-16 LAB — PREPARE RBC (CROSSMATCH)

## 2019-04-16 LAB — FIBRINOGEN
Fibrinogen: 349 mg/dL (ref 210–475)
Fibrinogen: 266 mg/dL (ref 210–475)

## 2019-04-16 SURGERY — CORONARY/GRAFT ACUTE MI REVASCULARIZATION
Anesthesia: LOCAL

## 2019-04-16 SURGERY — CORONARY ARTERY BYPASS GRAFTING (CABG)
Anesthesia: General | Site: Chest

## 2019-04-16 MED ORDER — INSULIN ASPART 100 UNIT/ML ~~LOC~~ SOLN
0.0000 [IU] | SUBCUTANEOUS | Status: DC
  Administered 2019-04-17: 2 [IU] via SUBCUTANEOUS
  Administered 2019-04-17 (×2): 4 [IU] via SUBCUTANEOUS
  Administered 2019-04-17: 2 [IU] via SUBCUTANEOUS
  Administered 2019-04-17: 4 [IU] via SUBCUTANEOUS
  Administered 2019-04-17: 08:00:00 2 [IU] via SUBCUTANEOUS
  Administered 2019-04-18: 4 [IU] via SUBCUTANEOUS
  Administered 2019-04-18 – 2019-04-19 (×6): 2 [IU] via SUBCUTANEOUS

## 2019-04-16 MED ORDER — LACTATED RINGERS IV SOLN: 500.0000 mL | Freq: Once | INTRAVENOUS | Status: DC | PRN

## 2019-04-16 MED ORDER — METOPROLOL TARTRATE 12.5 MG HALF TABLET: 12.5000 mg | ORAL_TABLET | Freq: Once | ORAL | Status: DC

## 2019-04-16 MED ORDER — TERAZOSIN HCL 2 MG PO CAPS: 2.0000 mg | ORAL_CAPSULE | Freq: Every day | ORAL | Status: DC

## 2019-04-16 MED ORDER — MANNITOL 20 % IV SOLN
Freq: Once | INTRAVENOUS | Status: DC
  Filled 2019-04-16: qty 13

## 2019-04-16 MED ORDER — VANCOMYCIN HCL 10 G IV SOLR
1500.0000 mg | INTRAVENOUS | Status: AC
  Administered 2019-04-16: 1500 mg via INTRAVENOUS
  Filled 2019-04-16: qty 1500
  Filled 2019-04-16: qty 1000

## 2019-04-16 MED ORDER — LIDOCAINE HCL (PF) 1 % IJ SOLN
INTRAMUSCULAR | Status: AC
  Filled 2019-04-16: qty 30

## 2019-04-16 MED ORDER — HEPARIN SODIUM (PORCINE) 1000 UNIT/ML IJ SOLN
INTRAMUSCULAR | Status: DC | PRN
  Administered 2019-04-16: 12000 [IU] via INTRAVENOUS
  Administered 2019-04-16 (×2): 5000 [IU] via INTRAVENOUS

## 2019-04-16 MED ORDER — SODIUM CHLORIDE 0.9 % IV SOLN
INTRAVENOUS | Status: DC
  Filled 2019-04-16: qty 30

## 2019-04-16 MED ORDER — HEPARIN (PORCINE) IN NACL 1000-0.9 UT/500ML-% IV SOLN
INTRAVENOUS | Status: AC
  Filled 2019-04-16: qty 1000

## 2019-04-16 MED ORDER — SODIUM CHLORIDE 0.9 % IV SOLN
1.5000 g | INTRAVENOUS | Status: AC
  Administered 2019-04-16: 1.5 g via INTRAVENOUS
  Filled 2019-04-16 (×2): qty 1.5

## 2019-04-16 MED ORDER — TRANEXAMIC ACID (OHS) BOLUS VIA INFUSION
15.0000 mg/kg | INTRAVENOUS | Status: AC
  Administered 2019-04-16: 11:00:00 1932 mg via INTRAVENOUS
  Filled 2019-04-16: qty 1932

## 2019-04-16 MED ORDER — MILRINONE LACTATE IN DEXTROSE 20-5 MG/100ML-% IV SOLN
0.3000 ug/kg/min | INTRAVENOUS | Status: DC
  Filled 2019-04-16: qty 100

## 2019-04-16 MED ORDER — PROPOFOL 10 MG/ML IV BOLUS
INTRAVENOUS | Status: AC
  Filled 2019-04-16: qty 20

## 2019-04-16 MED ORDER — NITROGLYCERIN 1 MG/10 ML FOR IR/CATH LAB
INTRA_ARTERIAL | Status: AC
  Filled 2019-04-16: qty 10

## 2019-04-16 MED ORDER — ACETAMINOPHEN 500 MG PO TABS
1000.0000 mg | ORAL_TABLET | Freq: Four times a day (QID) | ORAL | Status: AC
  Administered 2019-04-17 – 2019-04-21 (×15): 1000 mg via ORAL
  Filled 2019-04-16 (×15): qty 2

## 2019-04-16 MED ORDER — SODIUM CHLORIDE 0.9 % IV SOLN
INTRAVENOUS | Status: DC
  Administered 2019-04-16 – 2019-04-19 (×4): via INTRAVENOUS

## 2019-04-16 MED ORDER — TRAMADOL HCL 50 MG PO TABS
50.0000 mg | ORAL_TABLET | Freq: Two times a day (BID) | ORAL | Status: DC | PRN
  Administered 2019-04-17 – 2019-04-30 (×9): 50 mg via ORAL
  Filled 2019-04-16 (×10): qty 1

## 2019-04-16 MED ORDER — DOBUTAMINE IN D5W 4-5 MG/ML-% IV SOLN
1.5000 ug/kg/min | INTRAVENOUS | Status: DC
  Administered 2019-04-17: 4.5 ug/kg/min via INTRAVENOUS
  Administered 2019-04-19 – 2019-04-21 (×2): 2.5 ug/kg/min via INTRAVENOUS
  Filled 2019-04-16 (×2): qty 250

## 2019-04-16 MED ORDER — ROCURONIUM BROMIDE 100 MG/10ML IV SOLN
INTRAVENOUS | Status: DC | PRN
  Administered 2019-04-16 (×2): 100 mg via INTRAVENOUS

## 2019-04-16 MED ORDER — FENTANYL CITRATE (PF) 250 MCG/5ML IJ SOLN
INTRAMUSCULAR | Status: DC | PRN
  Administered 2019-04-16 (×2): 100 ug via INTRAVENOUS
  Administered 2019-04-16: 50 ug via INTRAVENOUS
  Administered 2019-04-16: 150 ug via INTRAVENOUS
  Administered 2019-04-16 (×2): 50 ug via INTRAVENOUS

## 2019-04-16 MED ORDER — CHLORHEXIDINE GLUCONATE 0.12% ORAL RINSE (MEDLINE KIT)
15.0000 mL | Freq: Two times a day (BID) | OROMUCOSAL | Status: DC
  Administered 2019-04-16: 15 mL via OROMUCOSAL

## 2019-04-16 MED ORDER — EPINEPHRINE HCL 5 MG/250ML IV SOLN IN NS
0.0000 ug/min | INTRAVENOUS | Status: AC
  Administered 2019-04-16: 4 ug/min via INTRAVENOUS
  Filled 2019-04-16: qty 250

## 2019-04-16 MED ORDER — PROPOFOL 10 MG/ML IV BOLUS
INTRAVENOUS | Status: DC | PRN
  Administered 2019-04-16: 80 mg via INTRAVENOUS

## 2019-04-16 MED ORDER — MIDAZOLAM HCL (PF) 10 MG/2ML IJ SOLN
INTRAMUSCULAR | Status: AC
  Filled 2019-04-16: qty 2

## 2019-04-16 MED ORDER — SODIUM CHLORIDE 0.9 % IV SOLN
20.0000 ug | Freq: Once | INTRAVENOUS | Status: AC
  Administered 2019-04-16: 20 ug via INTRAVENOUS
  Filled 2019-04-16: qty 5

## 2019-04-16 MED ORDER — FLUTICASONE PROPIONATE 50 MCG/ACT NA SUSP
2.0000 | Freq: Every day | NASAL | Status: DC
  Administered 2019-04-20 – 2019-04-30 (×9): 2 via NASAL
  Filled 2019-04-16: qty 16

## 2019-04-16 MED ORDER — ROSUVASTATIN CALCIUM 5 MG PO TABS
5.0000 mg | ORAL_TABLET | Freq: Every day | ORAL | Status: DC
  Administered 2019-04-17 – 2019-04-28 (×12): 5 mg via ORAL
  Filled 2019-04-16 (×12): qty 1

## 2019-04-16 MED ORDER — NOREPINEPHRINE 4 MG/250ML-% IV SOLN: 0.0000 ug/min | INTRAVENOUS | Status: DC

## 2019-04-16 MED ORDER — FAMOTIDINE IN NACL 20-0.9 MG/50ML-% IV SOLN
20.0000 mg | Freq: Two times a day (BID) | INTRAVENOUS | Status: AC
  Administered 2019-04-16 (×2): 20 mg via INTRAVENOUS
  Filled 2019-04-16: qty 50

## 2019-04-16 MED ORDER — ASPIRIN EC 325 MG PO TBEC
325.0000 mg | DELAYED_RELEASE_TABLET | Freq: Every day | ORAL | Status: DC
  Administered 2019-04-17 – 2019-04-24 (×8): 325 mg via ORAL
  Filled 2019-04-16 (×8): qty 1

## 2019-04-16 MED ORDER — FEBUXOSTAT 40 MG PO TABS
40.0000 mg | ORAL_TABLET | ORAL | Status: DC
  Administered 2019-04-18 – 2019-04-28 (×6): 40 mg via ORAL
  Filled 2019-04-16 (×6): qty 1

## 2019-04-16 MED ORDER — SODIUM CHLORIDE 0.9% FLUSH: 3.0000 mL | INTRAVENOUS | Status: DC | PRN

## 2019-04-16 MED ORDER — DOPAMINE-DEXTROSE 3.2-5 MG/ML-% IV SOLN: 0.0000 ug/kg/min | INTRAVENOUS | Status: DC

## 2019-04-16 MED ORDER — ACETAMINOPHEN 650 MG RE SUPP: 650.0000 mg | Freq: Once | RECTAL | Status: AC

## 2019-04-16 MED ORDER — VANCOMYCIN HCL IN DEXTROSE 1-5 GM/200ML-% IV SOLN
1000.0000 mg | Freq: Once | INTRAVENOUS | Status: AC
  Administered 2019-04-17: 1000 mg via INTRAVENOUS
  Filled 2019-04-16: qty 200

## 2019-04-16 MED ORDER — FENTANYL CITRATE (PF) 100 MCG/2ML IJ SOLN
INTRAMUSCULAR | Status: DC | PRN
  Administered 2019-04-16: 25 ug via INTRAVENOUS

## 2019-04-16 MED ORDER — TRANEXAMIC ACID 1000 MG/10ML IV SOLN
1.5000 mg/kg/h | INTRAVENOUS | Status: AC
  Administered 2019-04-16: 1.5 mg/kg/h via INTRAVENOUS
  Filled 2019-04-16: qty 25

## 2019-04-16 MED ORDER — DOPAMINE-DEXTROSE 3.2-5 MG/ML-% IV SOLN
0.0000 ug/kg/min | INTRAVENOUS | Status: DC
  Filled 2019-04-16: qty 250

## 2019-04-16 MED ORDER — CALCIUM CHLORIDE 10 % IV SOLN
INTRAVENOUS | Status: DC | PRN
  Administered 2019-04-16 (×4): 500 mg via INTRAVENOUS

## 2019-04-16 MED ORDER — HEPARIN SODIUM (PORCINE) 5000 UNIT/ML IJ SOLN: INTRAMUSCULAR | Status: DC | PRN

## 2019-04-16 MED ORDER — ALBUMIN HUMAN 5 % IV SOLN
250.0000 mL | INTRAVENOUS | Status: AC | PRN
  Administered 2019-04-17 (×2): 12.5 g via INTRAVENOUS

## 2019-04-16 MED ORDER — MIDAZOLAM HCL 2 MG/2ML IJ SOLN
INTRAMUSCULAR | Status: AC
  Filled 2019-04-16: qty 2

## 2019-04-16 MED ORDER — FUROSEMIDE 10 MG/ML IJ SOLN
INTRAMUSCULAR | Status: AC
  Filled 2019-04-16: qty 8

## 2019-04-16 MED ORDER — HEPARIN (PORCINE) IN NACL 1000-0.9 UT/500ML-% IV SOLN
INTRAVENOUS | Status: DC | PRN
  Administered 2019-04-16 (×2): 500 mL

## 2019-04-16 MED ORDER — ORAL CARE MOUTH RINSE
15.0000 mL | OROMUCOSAL | Status: DC
  Administered 2019-04-17 (×3): 15 mL via OROMUCOSAL

## 2019-04-16 MED ORDER — SODIUM BICARBONATE 8.4 % IV SOLN
INTRAVENOUS | Status: DC | PRN
  Administered 2019-04-16: 50 meq via INTRAVENOUS

## 2019-04-16 MED ORDER — HEPARIN SODIUM (PORCINE) 1000 UNIT/ML IJ SOLN
INTRAMUSCULAR | Status: AC
  Filled 2019-04-16: qty 1

## 2019-04-16 MED ORDER — CHLORHEXIDINE GLUCONATE 0.12 % MT SOLN: 15.0000 mL | OROMUCOSAL | Status: DC

## 2019-04-16 MED ORDER — AMIODARONE LOAD VIA INFUSION
INTRAVENOUS | Status: DC | PRN
  Administered 2019-04-16: 09:00:00 150 mg

## 2019-04-16 MED ORDER — ARTIFICIAL TEARS OPHTHALMIC OINT
TOPICAL_OINTMENT | OPHTHALMIC | Status: DC | PRN
  Administered 2019-04-16: 1 via OPHTHALMIC

## 2019-04-16 MED ORDER — LACTATED RINGERS IV SOLN: INTRAVENOUS | Status: DC

## 2019-04-16 MED ORDER — DOCUSATE SODIUM 100 MG PO CAPS
200.0000 mg | ORAL_CAPSULE | Freq: Every day | ORAL | Status: DC
  Administered 2019-04-17 – 2019-04-21 (×5): 200 mg via ORAL
  Filled 2019-04-16 (×5): qty 2

## 2019-04-16 MED ORDER — CHLORHEXIDINE GLUCONATE CLOTH 2 % EX PADS
6.0000 | MEDICATED_PAD | Freq: Every day | CUTANEOUS | Status: DC
  Administered 2019-04-17 – 2019-04-30 (×12): 6 via TOPICAL

## 2019-04-16 MED ORDER — SODIUM BICARBONATE 650 MG PO TABS
1300.0000 mg | ORAL_TABLET | Freq: Two times a day (BID) | ORAL | Status: DC
  Administered 2019-04-17 – 2019-04-19 (×6): 1300 mg via ORAL
  Filled 2019-04-16 (×6): qty 2

## 2019-04-16 MED ORDER — METOPROLOL TARTRATE 12.5 MG HALF TABLET
12.5000 mg | ORAL_TABLET | Freq: Two times a day (BID) | ORAL | Status: DC
  Filled 2019-04-16 (×2): qty 1

## 2019-04-16 MED ORDER — LIDOCAINE HCL (PF) 1 % IJ SOLN
INTRAMUSCULAR | Status: DC | PRN
  Administered 2019-04-16: 15 mL
  Administered 2019-04-16: 10 mL
  Administered 2019-04-16: 15 mL

## 2019-04-16 MED ORDER — SODIUM CHLORIDE 0.9% FLUSH
3.0000 mL | Freq: Two times a day (BID) | INTRAVENOUS | Status: DC
  Administered 2019-04-17 – 2019-04-30 (×12): 3 mL via INTRAVENOUS

## 2019-04-16 MED ORDER — NOREPINEPHRINE BITARTRATE 1 MG/ML IV SOLN
INTRAVENOUS | Status: DC | PRN
  Administered 2019-04-16: 11:00:00 20 ug/min via INTRAVENOUS
  Administered 2019-04-16: 40 ug/min via INTRAVENOUS
  Administered 2019-04-16: 11:00:00 30 ug/min via INTRAVENOUS

## 2019-04-16 MED ORDER — SODIUM CHLORIDE 0.9 % IV SOLN
INTRAVENOUS | Status: DC | PRN
  Administered 2019-04-16: 4 ug/kg/min via INTRAVENOUS

## 2019-04-16 MED ORDER — VASOPRESSIN 20 UNIT/ML IV SOLN
0.0300 [IU]/min | INTRAVENOUS | Status: AC
  Administered 2019-04-16: .04 [IU]/min via INTRAVENOUS
  Filled 2019-04-16: qty 2

## 2019-04-16 MED ORDER — NOREPINEPHRINE 16 MG/250ML-% IV SOLN
0.0000 ug/min | INTRAVENOUS | Status: DC
  Administered 2019-04-18: 21.333 ug/min via INTRAVENOUS
  Administered 2019-04-18: 18 ug/min via INTRAVENOUS
  Administered 2019-04-20: 9 ug/min via INTRAVENOUS
  Administered 2019-04-21: 8 ug/min via INTRAVENOUS
  Administered 2019-04-25: 09:00:00 5 ug/min via INTRAVENOUS
  Filled 2019-04-16 (×5): qty 250

## 2019-04-16 MED ORDER — PHENYLEPHRINE HCL-NACL 20-0.9 MG/250ML-% IV SOLN
30.0000 ug/min | INTRAVENOUS | Status: DC
  Filled 2019-04-16: qty 250

## 2019-04-16 MED ORDER — HEPARIN SODIUM (PORCINE) 5000 UNIT/ML IJ SOLN
50000.0000 [IU] | INTRAVENOUS | Status: DC
  Administered 2019-04-16: 20:00:00 50000 [IU]
  Filled 2019-04-16 (×2): qty 10

## 2019-04-16 MED ORDER — BISACODYL 5 MG PO TBEC
10.0000 mg | DELAYED_RELEASE_TABLET | Freq: Every day | ORAL | Status: DC
  Administered 2019-04-17 – 2019-04-27 (×7): 10 mg via ORAL
  Filled 2019-04-16 (×9): qty 2

## 2019-04-16 MED ORDER — FUROSEMIDE 10 MG/ML IJ SOLN
INTRAMUSCULAR | Status: DC | PRN
  Administered 2019-04-16: 80 mg via INTRAVENOUS

## 2019-04-16 MED ORDER — ONDANSETRON HCL 4 MG/2ML IJ SOLN
4.0000 mg | Freq: Four times a day (QID) | INTRAMUSCULAR | Status: DC | PRN
  Administered 2019-04-18 (×2): 4 mg via INTRAVENOUS
  Filled 2019-04-16 (×2): qty 2

## 2019-04-16 MED ORDER — MIDAZOLAM HCL 2 MG/2ML IJ SOLN
2.0000 mg | INTRAMUSCULAR | Status: DC | PRN
  Administered 2019-04-16 – 2019-04-19 (×7): 2 mg via INTRAVENOUS
  Filled 2019-04-16 (×7): qty 2

## 2019-04-16 MED ORDER — INSULIN REGULAR(HUMAN) IN NACL 100-0.9 UT/100ML-% IV SOLN: INTRAVENOUS | Status: DC

## 2019-04-16 MED ORDER — NOREPINEPHRINE BITARTRATE 1 MG/ML IV SOLN: INTRAVENOUS | Status: DC | PRN

## 2019-04-16 MED ORDER — VERAPAMIL HCL 2.5 MG/ML IV SOLN
INTRAVENOUS | Status: AC
  Filled 2019-04-16: qty 2

## 2019-04-16 MED ORDER — IOHEXOL 350 MG/ML SOLN
INTRAVENOUS | Status: DC | PRN
  Administered 2019-04-16: 150 mL

## 2019-04-16 MED ORDER — 0.9 % SODIUM CHLORIDE (POUR BTL) OPTIME
TOPICAL | Status: DC | PRN
  Administered 2019-04-16: 5000 mL

## 2019-04-16 MED ORDER — SODIUM CHLORIDE 0.9 % IV SOLN
750.0000 mg | INTRAVENOUS | Status: AC
  Administered 2019-04-16: 15:00:00 750 mg via INTRAVENOUS
  Filled 2019-04-16: qty 750

## 2019-04-16 MED ORDER — DEXMEDETOMIDINE HCL IN NACL 200 MCG/50ML IV SOLN
0.0000 ug/kg/h | INTRAVENOUS | Status: DC
  Administered 2019-04-16: 23:00:00 0.4 ug/kg/h via INTRAVENOUS
  Filled 2019-04-16: qty 50

## 2019-04-16 MED ORDER — CHLORHEXIDINE GLUCONATE CLOTH 2 % EX PADS: 6.0000 | MEDICATED_PAD | Freq: Once | CUTANEOUS | Status: DC

## 2019-04-16 MED ORDER — HEPARIN SODIUM (PORCINE) 1000 UNIT/ML IJ SOLN
INTRAMUSCULAR | Status: DC | PRN
  Administered 2019-04-16: 25000 [IU] via INTRAVENOUS

## 2019-04-16 MED ORDER — ALBUMIN HUMAN 5 % IV SOLN
INTRAVENOUS | Status: DC | PRN
  Administered 2019-04-16: 15:00:00 via INTRAVENOUS

## 2019-04-16 MED ORDER — DOBUTAMINE IN D5W 4-5 MG/ML-% IV SOLN
INTRAVENOUS | Status: DC | PRN
  Administered 2019-04-16: 5 ug/kg/min via INTRAVENOUS

## 2019-04-16 MED ORDER — BISACODYL 5 MG PO TBEC: 5.0000 mg | DELAYED_RELEASE_TABLET | Freq: Once | ORAL | Status: DC

## 2019-04-16 MED ORDER — DEXMEDETOMIDINE HCL IN NACL 400 MCG/100ML IV SOLN
0.1000 ug/kg/h | INTRAVENOUS | Status: AC
  Administered 2019-04-16: 0.7 ug/kg/h via INTRAVENOUS
  Filled 2019-04-16: qty 100

## 2019-04-16 MED ORDER — EPINEPHRINE HCL 5 MG/250ML IV SOLN IN NS
0.5000 ug/min | INTRAVENOUS | Status: DC
  Administered 2019-04-17: 2 ug/min via INTRAVENOUS
  Administered 2019-04-18: 3 ug/min via INTRAVENOUS
  Filled 2019-04-16 (×4): qty 250

## 2019-04-16 MED ORDER — NOREPINEPHRINE BITARTRATE 1 MG/ML IV SOLN
INTRAVENOUS | Status: AC | PRN
  Administered 2019-04-16: 15 ug/min via INTRAVENOUS

## 2019-04-16 MED ORDER — MAGNESIUM SULFATE 4 GM/100ML IV SOLN: 4.0000 g | Freq: Once | INTRAVENOUS | Status: DC

## 2019-04-16 MED ORDER — CHLORHEXIDINE GLUCONATE 0.12 % MT SOLN: 15.0000 mL | Freq: Once | OROMUCOSAL | Status: DC

## 2019-04-16 MED ORDER — AMIODARONE HCL IN DEXTROSE 360-4.14 MG/200ML-% IV SOLN
30.0000 mg/h | INTRAVENOUS | Status: DC
  Administered 2019-04-16: 09:00:00 via INTRAVENOUS
  Administered 2019-04-16 – 2019-04-19 (×14): 60 mg/h via INTRAVENOUS
  Administered 2019-04-20 (×2): 30 mg/h via INTRAVENOUS
  Administered 2019-04-20: 60 mg/h via INTRAVENOUS
  Administered 2019-04-21 – 2019-04-25 (×10): 30 mg/h via INTRAVENOUS
  Filled 2019-04-16 (×30): qty 200

## 2019-04-16 MED ORDER — FENTANYL CITRATE (PF) 250 MCG/5ML IJ SOLN
INTRAMUSCULAR | Status: AC
  Filled 2019-04-16: qty 20

## 2019-04-16 MED ORDER — NOREPINEPHRINE 4 MG/250ML-% IV SOLN
INTRAVENOUS | Status: AC
  Filled 2019-04-16: qty 250

## 2019-04-16 MED ORDER — VASOPRESSIN 20 UNIT/ML IV SOLN
0.0400 [IU]/min | INTRAVENOUS | Status: DC
  Administered 2019-04-17 – 2019-04-19 (×4): 0.04 [IU]/min via INTRAVENOUS
  Filled 2019-04-16 (×5): qty 2

## 2019-04-16 MED ORDER — DEXTROSE 5 % SOLN FOR IMPELLA PURGE CATHETER
INTRAVENOUS | Status: DC
  Filled 2019-04-16 (×2): qty 1000

## 2019-04-16 MED ORDER — TRANEXAMIC ACID 1000 MG/10ML IV SOLN
1.5000 mg/kg/h | INTRAVENOUS | Status: DC
  Filled 2019-04-16: qty 25

## 2019-04-16 MED ORDER — INSULIN REGULAR(HUMAN) IN NACL 100-0.9 UT/100ML-% IV SOLN
INTRAVENOUS | Status: AC
  Administered 2019-04-16: 12:00:00 2.2 [IU]/h via INTRAVENOUS
  Filled 2019-04-16: qty 100

## 2019-04-16 MED ORDER — PHENYLEPHRINE HCL-NACL 20-0.9 MG/250ML-% IV SOLN: 0.0000 ug/min | INTRAVENOUS | Status: DC

## 2019-04-16 MED ORDER — ASPIRIN 81 MG PO CHEW: 324.0000 mg | CHEWABLE_TABLET | Freq: Every day | ORAL | Status: DC

## 2019-04-16 MED ORDER — HEPARIN SODIUM (PORCINE) 1000 UNIT/ML IJ SOLN
INTRAMUSCULAR | Status: AC
  Filled 2019-04-16: qty 2

## 2019-04-16 MED ORDER — TRANEXAMIC ACID (OHS) PUMP PRIME SOLUTION
2.0000 mg/kg | INTRAVENOUS | Status: DC
  Filled 2019-04-16: qty 2.58

## 2019-04-16 MED ORDER — SODIUM CHLORIDE 0.9 % IV SOLN
1.5000 g | INTRAVENOUS | Status: DC
  Administered 2019-04-17: 1.5 g via INTRAVENOUS
  Filled 2019-04-16 (×2): qty 1.5

## 2019-04-16 MED ORDER — METOPROLOL TARTRATE 5 MG/5ML IV SOLN: 2.5000 mg | INTRAVENOUS | Status: DC | PRN

## 2019-04-16 MED ORDER — LACTATED RINGERS IV SOLN
INTRAVENOUS | Status: DC
  Administered 2019-04-16: 17:00:00 via INTRAVENOUS

## 2019-04-16 MED ORDER — SODIUM CHLORIDE 0.9 % IV BOLUS: 1000.0000 mL | Freq: Once | INTRAVENOUS | Status: DC

## 2019-04-16 MED ORDER — ACETAMINOPHEN 160 MG/5ML PO SOLN
1000.0000 mg | Freq: Four times a day (QID) | ORAL | Status: AC
  Administered 2019-04-17: 1000 mg
  Filled 2019-04-16: qty 40.6

## 2019-04-16 MED ORDER — CANGRELOR BOLUS VIA INFUSION
INTRAVENOUS | Status: DC | PRN
  Administered 2019-04-16: 09:00:00 3864 ug via INTRAVENOUS

## 2019-04-16 MED ORDER — MIDAZOLAM HCL 5 MG/5ML IJ SOLN
INTRAMUSCULAR | Status: DC | PRN
  Administered 2019-04-16: 3 mg via INTRAVENOUS
  Administered 2019-04-16 (×3): 2 mg via INTRAVENOUS
  Administered 2019-04-16: 1 mg via INTRAVENOUS
  Administered 2019-04-16: 2 mg via INTRAVENOUS
  Administered 2019-04-16: 1 mg via INTRAVENOUS
  Administered 2019-04-16: 3 mg via INTRAVENOUS

## 2019-04-16 MED ORDER — AMIODARONE HCL IN DEXTROSE 360-4.14 MG/200ML-% IV SOLN
INTRAVENOUS | Status: AC
  Filled 2019-04-16: qty 200

## 2019-04-16 MED ORDER — EPINEPHRINE PF 1 MG/ML IJ SOLN: INTRAMUSCULAR | Status: DC | PRN

## 2019-04-16 MED ORDER — PLASMA-LYTE 148 IV SOLN
INTRAVENOUS | Status: AC
  Administered 2019-04-16: 500 mL
  Filled 2019-04-16: qty 2.5

## 2019-04-16 MED ORDER — INSULIN REGULAR BOLUS VIA INFUSION
0.0000 [IU] | Freq: Three times a day (TID) | INTRAVENOUS | Status: DC
  Filled 2019-04-16: qty 10

## 2019-04-16 MED ORDER — PANTOPRAZOLE SODIUM 40 MG PO TBEC
40.0000 mg | DELAYED_RELEASE_TABLET | Freq: Every day | ORAL | Status: DC
  Administered 2019-04-18 – 2019-04-30 (×13): 40 mg via ORAL
  Filled 2019-04-16 (×14): qty 1

## 2019-04-16 MED ORDER — AMIODARONE LOAD VIA INFUSION
150.0000 mg | Freq: Once | INTRAVENOUS | Status: AC
  Administered 2019-04-16: 09:00:00 150 mg via INTRAVENOUS

## 2019-04-16 MED ORDER — CANGRELOR TETRASODIUM 50 MG IV SOLR
INTRAVENOUS | Status: AC
  Filled 2019-04-16: qty 50

## 2019-04-16 MED ORDER — EPINEPHRINE PF 1 MG/ML IJ SOLN
INTRAMUSCULAR | Status: DC | PRN
  Administered 2019-04-16: .2 mg via INTRAVENOUS

## 2019-04-16 MED ORDER — ACETAMINOPHEN 160 MG/5ML PO SOLN
650.0000 mg | Freq: Once | ORAL | Status: AC
  Administered 2019-04-23: 650 mg
  Filled 2019-04-16: qty 20.3

## 2019-04-16 MED ORDER — BISACODYL 10 MG RE SUPP
10.0000 mg | Freq: Every day | RECTAL | Status: DC
  Filled 2019-04-16: qty 1

## 2019-04-16 MED ORDER — NITROGLYCERIN IN D5W 200-5 MCG/ML-% IV SOLN
2.0000 ug/min | INTRAVENOUS | Status: DC
  Filled 2019-04-16: qty 250

## 2019-04-16 MED ORDER — PHENYLEPHRINE 40 MCG/ML (10ML) SYRINGE FOR IV PUSH (FOR BLOOD PRESSURE SUPPORT)
PREFILLED_SYRINGE | INTRAVENOUS | Status: DC | PRN
  Administered 2019-04-16: 80 ug via INTRAVENOUS
  Administered 2019-04-16 (×2): 40 ug via INTRAVENOUS
  Administered 2019-04-16: 120 ug via INTRAVENOUS
  Administered 2019-04-16: 40 ug via INTRAVENOUS

## 2019-04-16 MED ORDER — SODIUM CHLORIDE 0.45 % IV SOLN
INTRAVENOUS | Status: DC | PRN
  Administered 2019-04-16 – 2019-04-18 (×2): via INTRAVENOUS

## 2019-04-16 MED ORDER — PROTAMINE SULFATE 10 MG/ML IV SOLN
INTRAVENOUS | Status: DC | PRN
  Administered 2019-04-16: 220 mg via INTRAVENOUS

## 2019-04-16 MED ORDER — POTASSIUM CHLORIDE 2 MEQ/ML IV SOLN
80.0000 meq | INTRAVENOUS | Status: DC
  Filled 2019-04-16: qty 40

## 2019-04-16 MED ORDER — TEMAZEPAM 15 MG PO CAPS: 15.0000 mg | ORAL_CAPSULE | Freq: Once | ORAL | Status: DC | PRN

## 2019-04-16 MED ORDER — SODIUM CHLORIDE 0.9 % IV SOLN
INTRAVENOUS | Status: DC | PRN
  Administered 2019-04-16: 11:00:00 via INTRAVENOUS

## 2019-04-16 MED ORDER — SODIUM CHLORIDE (PF) 0.9 % IJ SOLN
OROMUCOSAL | Status: DC | PRN
  Administered 2019-04-16 (×3): 4 mL via TOPICAL

## 2019-04-16 MED ORDER — FENTANYL CITRATE (PF) 100 MCG/2ML IJ SOLN
INTRAMUSCULAR | Status: AC
  Filled 2019-04-16: qty 2

## 2019-04-16 MED ORDER — METOPROLOL TARTRATE 25 MG/10 ML ORAL SUSPENSION: 12.5000 mg | Freq: Two times a day (BID) | ORAL | Status: DC

## 2019-04-16 MED ORDER — MORPHINE SULFATE (PF) 2 MG/ML IV SOLN
1.0000 mg | INTRAVENOUS | Status: DC | PRN
  Administered 2019-04-16: 2 mg via INTRAVENOUS
  Administered 2019-04-16 – 2019-04-17 (×4): 4 mg via INTRAVENOUS
  Administered 2019-04-17: 2 mg via INTRAVENOUS
  Administered 2019-04-17: 4 mg via INTRAVENOUS
  Administered 2019-04-17 – 2019-04-25 (×4): 2 mg via INTRAVENOUS
  Filled 2019-04-16: qty 1
  Filled 2019-04-16: qty 2
  Filled 2019-04-16 (×2): qty 1
  Filled 2019-04-16: qty 2
  Filled 2019-04-16: qty 1
  Filled 2019-04-16 (×3): qty 2
  Filled 2019-04-16 (×2): qty 1

## 2019-04-16 MED ORDER — SODIUM CHLORIDE 0.9 % IV SOLN: 250.0000 mL | INTRAVENOUS | Status: DC

## 2019-04-16 MED ORDER — SODIUM CHLORIDE 0.9 % IV SOLN: 20.0000 ug | Freq: Once | INTRAVENOUS | Status: DC

## 2019-04-16 SURGICAL SUPPLY — 102 items
ADH SKN CLS APL DERMABOND .7 (GAUZE/BANDAGES/DRESSINGS) ×2
BAG DECANTER FOR FLEXI CONT (MISCELLANEOUS) ×5 IMPLANT
BASKET HEART (ORDER IN 25'S) (MISCELLANEOUS) ×1
BASKET HEART (ORDER IN 25S) (MISCELLANEOUS) ×2 IMPLANT
BLADE CLIPPER SURG (BLADE) ×3 IMPLANT
BLADE STERNUM SYSTEM 6 (BLADE) ×3 IMPLANT
BLADE SURG 11 STRL SS (BLADE) ×1 IMPLANT
BLOOD HAEMOCONCENTR 700 MIDI (MISCELLANEOUS) ×1 IMPLANT
BLOWER MISTER CAL-MED (MISCELLANEOUS) ×1 IMPLANT
BNDG ELASTIC 4X5.8 VLCR STR LF (GAUZE/BANDAGES/DRESSINGS) ×3 IMPLANT
BNDG ELASTIC 6X5.8 VLCR STR LF (GAUZE/BANDAGES/DRESSINGS) ×3 IMPLANT
BNDG GAUZE ELAST 4 BULKY (GAUZE/BANDAGES/DRESSINGS) ×3 IMPLANT
CANISTER SUCT 3000ML PPV (MISCELLANEOUS) ×7 IMPLANT
CANNULA EZ GLIDE AORTIC 21FR (CANNULA) ×5 IMPLANT
CATH CPB KIT HENDRICKSON (MISCELLANEOUS) ×3 IMPLANT
CLIP RETRACTION 3.0MM CORONARY (MISCELLANEOUS) ×2 IMPLANT
CLIP VESOCCLUDE MED 24/CT (CLIP) IMPLANT
CLIP VESOCCLUDE SM WIDE 24/CT (CLIP) ×1 IMPLANT
COVER SURGICAL LIGHT HANDLE (MISCELLANEOUS) ×1 IMPLANT
COVER WAND RF STERILE (DRAPES) ×2 IMPLANT
DERMABOND ADVANCED (GAUZE/BANDAGES/DRESSINGS) ×1
DERMABOND ADVANCED .7 DNX12 (GAUZE/BANDAGES/DRESSINGS) IMPLANT
DRAIN CHANNEL 19F RND (DRAIN) ×3 IMPLANT
DRAIN CONNECTOR BLAKE 1:1 (MISCELLANEOUS) ×1 IMPLANT
DRAIN JACKSON PRATT 10MM FLAT (MISCELLANEOUS) ×1 IMPLANT
DRAPE CARDIOVASCULAR INCISE (DRAPES) ×3
DRAPE INCISE IOBAN 66X45 STRL (DRAPES) ×3 IMPLANT
DRAPE SLUSH/WARMER DISC (DRAPES) ×3 IMPLANT
DRAPE SRG 135X102X78XABS (DRAPES) ×2 IMPLANT
DRSG AQUACEL AG ADV 3.5X14 (GAUZE/BANDAGES/DRESSINGS) ×1 IMPLANT
DRSG COVADERM 4X14 (GAUZE/BANDAGES/DRESSINGS) ×3 IMPLANT
DRSG TEGADERM 2-3/8X2-3/4 SM (GAUZE/BANDAGES/DRESSINGS) ×1 IMPLANT
DRSG XEROFORM 1X8 (GAUZE/BANDAGES/DRESSINGS) ×1 IMPLANT
ELECT REM PT RETURN 9FT ADLT (ELECTROSURGICAL) ×6
ELECTRODE REM PT RTRN 9FT ADLT (ELECTROSURGICAL) ×4 IMPLANT
EVACUATOR SILICONE 100CC (DRAIN) ×3 IMPLANT
FELT TEFLON 1X6 (MISCELLANEOUS) ×5 IMPLANT
GAUZE SPONGE 4X4 12PLY STRL (GAUZE/BANDAGES/DRESSINGS) ×6 IMPLANT
GAUZE SPONGE 4X4 12PLY STRL LF (GAUZE/BANDAGES/DRESSINGS) ×2 IMPLANT
GLOVE BIO SURGEON STRL SZ 6.5 (GLOVE) ×4 IMPLANT
GLOVE BIO SURGEON STRL SZ7 (GLOVE) ×7 IMPLANT
GLOVE BIO SURGEON STRL SZ7.5 (GLOVE) ×1 IMPLANT
GLOVE BIOGEL PI IND STRL 6 (GLOVE) IMPLANT
GLOVE BIOGEL PI IND STRL 7.5 (GLOVE) ×4 IMPLANT
GLOVE BIOGEL PI INDICATOR 6 (GLOVE) ×3
GLOVE BIOGEL PI INDICATOR 7.5 (GLOVE) ×2
GLOVE INDICATOR 7.0 STRL GRN (GLOVE) ×1 IMPLANT
GOWN STRL REUS W/ TWL LRG LVL3 (GOWN DISPOSABLE) ×8 IMPLANT
GOWN STRL REUS W/ TWL XL LVL3 (GOWN DISPOSABLE) ×4 IMPLANT
GOWN STRL REUS W/TWL LRG LVL3 (GOWN DISPOSABLE) ×24
GOWN STRL REUS W/TWL XL LVL3 (GOWN DISPOSABLE) ×6
HEMOSTAT POWDER SURGIFOAM 1G (HEMOSTASIS) ×9 IMPLANT
KIT BASIN OR (CUSTOM PROCEDURE TRAY) ×3 IMPLANT
KIT SUCTION CATH 14FR (SUCTIONS) ×7 IMPLANT
KIT TURNOVER KIT B (KITS) ×3 IMPLANT
KIT VASOVIEW HEMOPRO 2 VH 4000 (KITS) ×3 IMPLANT
LEAD PACING MYOCARDI (MISCELLANEOUS) ×2 IMPLANT
MARKER GRAFT CORONARY BYPASS (MISCELLANEOUS) ×9 IMPLANT
NS IRRIG 1000ML POUR BTL (IV SOLUTION) ×15 IMPLANT
OFFPUMP STABILIZER SUV (MISCELLANEOUS) ×1 IMPLANT
PACK E OPEN HEART (SUTURE) ×3 IMPLANT
PACK OPEN HEART (CUSTOM PROCEDURE TRAY) ×3 IMPLANT
PAD ARMBOARD 7.5X6 YLW CONV (MISCELLANEOUS) ×6 IMPLANT
PAD ELECT DEFIB RADIOL ZOLL (MISCELLANEOUS) ×3 IMPLANT
PENCIL BUTTON HOLSTER BLD 10FT (ELECTRODE) ×3 IMPLANT
POSITIONER ACROBAT-I OFFPUMP (MISCELLANEOUS) ×1 IMPLANT
POSITIONER HEAD DONUT 9IN (MISCELLANEOUS) ×3 IMPLANT
PUNCH AORTIC ROTATE 4.0MM (MISCELLANEOUS) ×1 IMPLANT
PUNCH AORTIC ROTATE 4.5MM 8IN (MISCELLANEOUS) IMPLANT
PUNCH AORTIC ROTATE 5MM 8IN (MISCELLANEOUS) IMPLANT
SET CARDIOPLEGIA MPS 5001102 (MISCELLANEOUS) ×1 IMPLANT
SPONGE LAP 18X18 RF (DISPOSABLE) ×4 IMPLANT
SPONGE LAP 4X18 RFD (DISPOSABLE) IMPLANT
SUT BONE WAX W31G (SUTURE) ×3 IMPLANT
SUT MNCRL AB 3-0 PS2 18 (SUTURE) ×8 IMPLANT
SUT PDS AB 1 CTX 36 (SUTURE) ×6 IMPLANT
SUT PROLENE 2 0 SH DA (SUTURE) IMPLANT
SUT PROLENE 3 0 SH DA (SUTURE) ×3 IMPLANT
SUT PROLENE 3 0 SH1 36 (SUTURE) IMPLANT
SUT PROLENE 4 0 RB 1 (SUTURE)
SUT PROLENE 4 0 SH DA (SUTURE) IMPLANT
SUT PROLENE 4-0 RB1 .5 CRCL 36 (SUTURE) IMPLANT
SUT PROLENE 5 0 C 1 36 (SUTURE) ×9 IMPLANT
SUT PROLENE 6 0 C 1 30 (SUTURE) IMPLANT
SUT PROLENE 7 0 BV 1 (SUTURE) ×2 IMPLANT
SUT PROLENE 8 0 BV175 6 (SUTURE) IMPLANT
SUT PROLENE BLUE 7 0 (SUTURE) ×3 IMPLANT
SUT PROLENE POLY MONO (SUTURE) IMPLANT
SUT STEEL 6MS V (SUTURE) ×4 IMPLANT
SUT VIC AB 2-0 CT1 27 (SUTURE) ×12
SUT VIC AB 2-0 CT1 TAPERPNT 27 (SUTURE) IMPLANT
SYSTEM SAHARA CHEST DRAIN ATS (WOUND CARE) ×3 IMPLANT
TAPE CLOTH SOFT 2X10 (GAUZE/BANDAGES/DRESSINGS) ×1 IMPLANT
TAPE CLOTH SURG 4X10 WHT LF (GAUZE/BANDAGES/DRESSINGS) ×2 IMPLANT
TOWEL GREEN STERILE (TOWEL DISPOSABLE) ×4 IMPLANT
TOWEL GREEN STERILE FF (TOWEL DISPOSABLE) ×3 IMPLANT
TRAY FOLEY SLVR 16FR TEMP STAT (SET/KITS/TRAYS/PACK) ×3 IMPLANT
TUBE CONNECTING 12X1/4 (SUCTIONS) ×1 IMPLANT
TUBING LAP HI FLOW INSUFFLATIO (TUBING) ×3 IMPLANT
UNDERPAD 30X30 (UNDERPADS AND DIAPERS) ×3 IMPLANT
WATER STERILE IRR 1000ML POUR (IV SOLUTION) ×6 IMPLANT
YANKAUER SUCT BULB TIP NO VENT (SUCTIONS) ×1 IMPLANT

## 2019-04-16 SURGICAL SUPPLY — 28 items
BALLN EMERGE MR 2.5X12 (BALLOONS) ×2
BALLN TREK OTW 2.5X12 (BALLOONS) ×2
BALLOON EMERGE MR 2.5X12 (BALLOONS) IMPLANT
BALLOON TREK OTW 2.5X12 (BALLOONS) IMPLANT
CATH INFINITI 5FR ANG PIGTAIL (CATHETERS) ×1 IMPLANT
CATH INFINITI JR4 5F (CATHETERS) ×1 IMPLANT
CATH SWAN GANZ VIP 7.5F (CATHETERS) ×1 IMPLANT
CATH VISTA GUIDE 6FR XBLAD3.5 (CATHETERS) ×1 IMPLANT
DEVICE CONTINUOUS FLUSH (MISCELLANEOUS) ×2 IMPLANT
DEVICE SECURE STATLOCK IABP (MISCELLANEOUS) ×2 IMPLANT
GLIDESHEATH SLEND SS 6F .021 (SHEATH) IMPLANT
GUIDEWIRE INQWIRE 1.5J.035X260 (WIRE) IMPLANT
INQWIRE 1.5J .035X260CM (WIRE)
KIT ENCORE 26 ADVANTAGE (KITS) ×1 IMPLANT
KIT HEART LEFT (KITS) ×2 IMPLANT
KIT MICROPUNCTURE NIT STIFF (SHEATH) ×2 IMPLANT
PACK CARDIAC CATHETERIZATION (CUSTOM PROCEDURE TRAY) ×2 IMPLANT
SET IMPELLA CP PUMP (CATHETERS) ×1 IMPLANT
SHEATH PINNACLE 5F 10CM (SHEATH) ×1 IMPLANT
SHEATH PINNACLE 6F 10CM (SHEATH) ×1 IMPLANT
SHEATH PINNACLE 8F 10CM (SHEATH) ×1 IMPLANT
SHEATH PROBE COVER 6X72 (BAG) ×2 IMPLANT
SLEEVE REPOSITIONING LENGTH 30 (MISCELLANEOUS) ×1 IMPLANT
TRANSDUCER W/STOPCOCK (MISCELLANEOUS) ×2 IMPLANT
TUBING CIL FLEX 10 FLL-RA (TUBING) ×2 IMPLANT
WIRE ASAHI FIELDER XT 300CM (WIRE) ×1 IMPLANT
WIRE COUGAR XT STRL 190CM (WIRE) ×1 IMPLANT
WIRE EMERALD 3MM-J .035X150CM (WIRE) ×1 IMPLANT

## 2019-04-16 NOTE — OR Nursing (Signed)
1515 2 HEART CHARGE NURSE CALLED AND GIVEN 45 MIN. ETA.

## 2019-04-16 NOTE — OR Nursing (Signed)
1639 GOOD THRILL FELT TO RUA AV FISTULA. HEMATOMA NOTED TO LEFT GROIN IMPELLA INSERTION SITE. EDGES MARKED FOR FURTHER MONITORING.

## 2019-04-16 NOTE — Progress Notes (Addendum)
Twice tonight patient went into a ST rhythm from 130-150, confirmed by EKG. Pt breaks out of rhythm spontaneously back to NSR in 80-90s. Pt tolerates HR change in BP. Attempts made to decrease epi and dobutamine to aid in decreasing HR.   Rapid wean delayed due to agitation and restlessness on vent with very frequent doses of PRN morphine and versed. Rapid wean initiated at 0338.   Of note, patient has had temp of 100.8 tonight. Pt receiving tylenol and zinacef. Will relay to oncoming RN.   At 0501 pt had suction event on impella. Dropped to P7, CVP 6, PRN albumin given.   0515 Pt on P8, CVP 7, aortic signal on impella still low 70/50(60)'s. Increased levo. Additional albumin given.   Pt continues to have suction events on P9. CVP 7/8 on 2 epi, 2 levo, 4.5 dobutamine. Decreased to P7 and pt tolerating well. Paged Dr. Cyndia Bent regarding the above, verbal order to decrease to P7 and keep it there. Informed of hgb 7.6, orders received for 1u pRBC.

## 2019-04-16 NOTE — Anesthesia Procedure Notes (Signed)
Arterial Line Insertion Start/End9/24/2020 10:35 AM, 04/16/2019 10:45 AM Performed by: Josephine Igo, CRNA, CRNA  Patient location: OR. Preanesthetic checklist: IV checked and monitors and equipment checked Lidocaine 1% used for infiltration Left, radial was placed Catheter size: 20 G Hand hygiene performed , maximum sterile barriers used  and Seldinger technique used  Attempts: 1 Procedure performed without using ultrasound guided technique. Following insertion, dressing applied and Biopatch. Post procedure assessment: normal  Patient tolerated the procedure well with no immediate complications.

## 2019-04-16 NOTE — Op Note (Signed)
[address]       [place] [address]             [phone]                                          04/16/2019 Patient:  Alex Williams Pre-Op Dx:  LM/3V CAD.     STEMI   ESRD   Morbid Obesity   Post-op Dx:  same Procedure: Emergency CABG X 3.  Warm and beating.  LIMA D2, RSVG OM2, PDA.  There was no good target on the LAD   Endoscopic greater saphenous vein harvest on the right Intra-operative Transesophageal Echocardiogram  Surgeon and Role:      * Samarie Pinder, Lucile Crater, MD - Primary    * Dr. Prescott Gum, MD - assisting  Assistant: Josie Saunders, PA-C  Anesthesia  general EBL:  1000 ml Blood Administration: per anesthesia record  Pump Time:  144min  Drains: 31 F blake drain: R, L, mediastinal  Wires: none Counts: correct   Indications: Baron A Hanksis a 68 y.o.malewith history of ESRD on HD, GERD, gout, HTN, HLD who presented to the ED with c/o chest pain and palpitations. EKG with ST elevation in the precordial leads. Code STEMI called by the ED.  LHC noted LM disease, and chronic occlusion of the LAD.  Was call emergently to the cath lab to assist with management.    Findings: Heavily calcified LAD.  Good conduit.  Good PDA, and OM target  Operative Technique: All invasive lines were placed in pre-op holding.  After the risks, benefits and alternatives were thoroughly discussed, the patient was brought to the operative theatre.  Anesthesia was induced, and the patient was prepped and draped in normal sterile fashion.  An appropriate surgical pause was performed, and pre-operative antibiotics were dosed accordingly.  We began with simultaneous incisions were made along the right leg for harvesting of the greater saphenous vein and the chest for the sternotomy.  In regards to the sternotomy, this was carried down with bovie cautery, and the sternum was divided with a reciprocating saw.  Meticulous hemostasis was obtained.  The left internal  thoracic artery was exposed and harvested in in pedicled fashion.  The patient was systemically heparinized, and the artery was divided distally, and placed in a papaverine sponge.    The sternal elevator was removed, and a retractor was placed.  The pericardium was divided in the midline and fashioned into a cradle with pericardial stitches.   After we confirmed an appropriate ACT, the ascending aorta was cannulated in standard fashion.  The right atrial appendage was used for venous cannulation site.  Cardiopulmonary bypass was initiated, and Impella flows were decreased to P1. We remained warm throughout the entirety of the case.  Next, we exposed the anterior wall of the heart.  The LAD was heavily calcified, and the territory appeared bruised from the previous PCI.  There was a good target on the diagonal branch.  We fashioned an end to side anastomosis between it and the LITA. We then moved to the posterior wall of the heart, and found a good target on the PDA.  An arteriotomy was made, and the vein graft was anastomosed to it in an end to side fashion.  Next we exposed the lateral wall, and found a good target on the  OM.  An end to side anastomosis with the vein graft was then created.  Meticulous hemostasis was obtained.    A partial occludding clamp was then placed on the ascending aorta, and we insured that the Impella was not entrapped.  We created an end to side anastomosis between it and the proximal vein grafts.  The proximal sites were marked with rings.  Hemostasis was obtained, and we increased the Impella to P9, and  separated from cardiopulmonary bypass without event.  The heparin was reversed with protamine.  Chest tubes and wires were placed, and the sternum was re-approximated with with sternal wires.  The soft tissue and skin were re-approximated wth absorbable suture.    The patient tolerated the procedure without any immediate complications, and was transferred to the ICU in guarded  condition.  Charmeka Freeburg Bary Leriche

## 2019-04-16 NOTE — Progress Notes (Signed)
  Echocardiogram 2D Echocardiogram limited has been performed.  Alex Williams 04/16/2019, 10:25 AM

## 2019-04-16 NOTE — OR Nursing (Signed)
[place] SKIN TEAR NOTED TO LEFT UPPER CHEST AFTER REMOVAL OF IOBAN.

## 2019-04-16 NOTE — Consult Note (Signed)
[address]       [place] [address]             [phone]        Philip A Dolley [facility] Medical Record [id number] Date of Birth: 1951/03/31  Referring: No ref. provider found Primary Care: [name], MD Primary Cardiologist:No primary care provider on file.  Chief Complaint:    Chief Complaint  Patient presents with  . Chest Pain    History of Present Illness:     Alex Williams is a 68 y.o. male with history of ESRD on HD, GERD, gout, HTN, HLD who presented to the ED with c/o chest pain and palpitations. EKG with ST elevation in the precordial leads. Code STEMI called by the ED.  LHC noted LM disease, and acute occlusion of the LAD.  Was call emergently to the cath lab to assist with management.  Currently placing Impella, and with PCI LAD lesion.   Past Medical and Surgical History: Previous Chest Surgery: no Previous Chest Radiation: no Diabetes Mellitus: unknown.  HbA1C pending Creatinine: 7.54  Past Medical History:  Diagnosis Date  . Cervical disc disease   . Chronic kidney disease   . COLONIC POLYPS, HX OF 06/27/2007  . EXOGENOUS OBESITY 01/30/2010  . GERD (gastroesophageal reflux disease)    PMH  . GOUT 01/30/2010  . HYPERCHOLESTEROLEMIA 06/30/2007  . HYPERTENSION 06/27/2007  . LOW BACK PAIN 06/27/2007  . NEPHROLITHIASIS, HX OF 06/27/2007  . OSTEOARTHRITIS 06/27/2007  . SLEEP APNEA, OBSTRUCTIVE, MODERATE 01/27/2009  . Wears dentures    upper    Past Surgical History:  Procedure Laterality Date  . AV FISTULA PLACEMENT Right 01/19/2019   Procedure: RIGHT BRACHIOCEPHALIC ARTERIOVENOUS (AV) FISTULA CREATION;  Surgeon: Angelia Mould, MD;  Location: [facility];  Service: Vascular;  Laterality: Right;  . CARDIAC CATHETERIZATION    . CARPAL TUNNEL RELEASE     left hand  . COLONOSCOPY     with polypectomy  . JOINT REPLACEMENT Left 2005   knee  . LUMBAR LAMINECTOMY    . MULTIPLE TOOTH EXTRACTIONS    . RADIOFREQUENCY ABLATION  KIDNEY    . TOTAL KNEE ARTHROPLASTY     left    Social History: Support: daughter in the waiting area  Social History   Tobacco Use  Smoking Status Former Smoker  . Quit date: 07/23/1980  . Years since quitting: 38.7  Smokeless Tobacco Never Used    Social History   Substance and Sexual Activity  Alcohol Use Yes  . Alcohol/week: 1.0 standard drinks  . Types: 1 Cans of beer per week   Comment: rare     Allergies  Allergen Reactions  . Codeine Phosphate Other (See Comments)    Hyperactive     Medications: Asprin: no Statin: yes Beta Blocker: yes Ace Inhibitor: no Anti-Coagulation: currently receiving cangrelor  Current Facility-Administered Medications  Medication Dose Route Frequency Provider Last Rate Last Dose  . amiodarone (NEXTERONE PREMIX) 360-4.14 MG/200ML-% (1.8 mg/mL) IV infusion  60 mg/hr Intravenous Continuous Burnell Blanks, MD 33.3 mL/hr at 04/16/19 [phone]    . cangrelor [facility]) 50,000 mcg in sodium chloride 0.9 % 250 mL (200 mcg/mL) infusion    Continuous PRN Burnell Blanks, MD 29 mL/hr at 04/16/19 0902 0.75 mcg/kg/min at 04/16/19 0902  . cangrelor [facility]) bolus via infusion    PRN Burnell Blanks, MD   3,864 mcg at 04/16/19 0850  . Heparin (Porcine) in NaCl  1000-0.9 UT/500ML-% SOLN    PRN Burnell Blanks, MD   500 mL at 04/16/19 [id number]  . lidocaine (PF) (XYLOCAINE) 1 % injection    PRN Burnell Blanks, MD   15 mL at 04/16/19 0910  . norepinephrine (LEVOPHED) 4 mg in dextrose 5 % 250 mL (0.016 mg/mL) infusion    Continuous PRN Burnell Blanks, MD 56.3 mL/hr at 04/16/19 0844 15 mcg/min at 04/16/19 0844  . [MAR Hold] sodium chloride 0.9 % bolus 1,000 mL  1,000 mL Intravenous Once Trifan, Carola Rhine, MD        Medications Prior to Admission  Medication Sig Dispense Refill Last Dose  . acetaminophen (TYLENOL) 500 MG tablet Take 1,000 mg by mouth 2 (two) times a day. Takes 1000mg  in the morning and 1000mg  in  the evening.      Marland Kitchen amLODipine (NORVASC) 10 MG tablet Take 1 tablet (10 mg total) by mouth daily. 90 tablet 3   . CALCIUM CITRATE-VITAMIN D PO Take 1 tablet by mouth daily.      . carvedilol (COREG) 25 MG tablet Take 25 mg by mouth every morning.   3   . Ferrous Sulfate (IRON) 325 (65 Fe) MG TABS Take 1 tablet by mouth daily at 6 (six) AM.     . fluticasone (FLONASE) 50 MCG/ACT nasal spray Place 2 sprays into both nostrils daily. 16 g 6   . furosemide (LASIX) 80 MG tablet Take 2 tablets (160 mg total) by mouth 2 (two) times daily. 120 tablet 0   . hydrOXYzine (ATARAX/VISTARIL) 25 MG tablet TAKE 1 TABLET (25 MG TOTAL) BY MOUTH 3 (THREE) TIMES DAILY AS NEEDED FOR ITCHING (INSOMNIA). 30 tablet 0   . Multiple Vitamins-Minerals (ONE-A-DAY MENS 50+ ADVANTAGE PO) Take 1 tablet by mouth daily.     . rosuvastatin (CRESTOR) 5 MG tablet Take 1 tablet (5 mg total) by mouth daily. (Patient taking differently: Take 5 mg by mouth See admin instructions. Every 3 days) 90 tablet 2   . sodium bicarbonate 650 MG tablet Take 1 tablet (650 mg total) by mouth 2 (two) times daily. (Patient taking differently: Take 1,300 mg by mouth 2 (two) times daily. ) 180 tablet 4   . terazosin (HYTRIN) 1 MG capsule Take 2 capsules (2 mg total) by mouth at bedtime. 180 capsule 1   . traZODone (DESYREL) 50 MG tablet TAKE 1/2 TO 1 TABLET AT BEDTIME AS NEEDED FOR SLEEP 90 tablet 0   . ULORIC 40 MG tablet Take 40 mg by mouth every other day.   6     Family History  Problem Relation Age of Onset  . Hypertension Other   . Diabetes Sister   . Hyperlipidemia Sister   . Sleep apnea Sister      Review of Systems:   Review of Systems  Unable to perform ROS: Critical illness      Physical Exam: BP 97/73   Pulse (!) 137   Temp (!) 97.5 F (36.4 C) (Oral)   Resp 19   Ht 6' (1.829 m)   Wt 128.8 kg   SpO2 98%   BMI 38.52 kg/m  Physical Exam  Cardiovascular:  Sinus, regular.  In and out of atrial flutter      Diagnostic  Studies & Laboratory data:     Echo: pending   I have independently reviewed the above radiologic studies and discussed with the patient   Recent Lab Findings: Lab Results  Component Value Date   WBC  8.8 04/16/2019   HGB 8.0 (L) 04/16/2019   HCT 25.3 (L) 04/16/2019   PLT 210 04/16/2019   GLUCOSE 121 (H) 04/16/2019   CHOL 201 (H) 04/16/2019   TRIG 178 (H) 04/16/2019   HDL 23 (L) 04/16/2019   LDLDIRECT 155.0 01/20/2009   LDLCALC 142 (H) 04/16/2019   ALT 8 02/05/2019   AST 10 (L) 02/05/2019   NA 140 04/16/2019   K 3.3 (L) 04/16/2019   CL 101 04/16/2019   CREATININE 7.54 (H) 04/16/2019   BUN 58 (H) 04/16/2019   CO2 22 04/16/2019   TSH 2.840 08/25/2018   INR 1.2 04/16/2019   HGBA1C 5.4 08/26/2018      Assessment / Plan:   68 yo male with Lm CAD, and acutely occluded LAD.  A flutter on the table.  PCI to LAD performed, but small vessel.  Will be taken emergently to the OR for revascularization.  Will likely place LAD on diagonal branch since it is a better target, and veins to the LAD, and OM.    Lajuana Matte 04/16/2019 9:13 AM

## 2019-04-16 NOTE — Brief Op Note (Addendum)
04/16/2019  1:13 PM  PATIENT:  Alex Williams  68 y.o. male  PRE-OPERATIVE DIAGNOSIS:  1. S/p STEMI 2. Cardiogenic Shock 3. CAD  POST-OPERATIVE DIAGNOSIS:  1. S/p STEMI 2. Cardiogenic Shock 3.CAD  PROCEDURE:TRANSESOPHAGEAL ECHOCARDIOGRAM (TEE),  EMERGENT MEDIAN STERNOTOMY for CORONARY ARTERY BYPASS GRAFTING (CABG)X3  (LIMA to DIAGONAL 2, SVG to OM, SVG to PDA)  WITH ENDOSCOPIC HARVESTING OF RIGHT GREATER SAPHENOUS VEIN and LEFT INTERNAL MAMMARY ARTERY  SURGEON:  Surgeon(s) and Role:    1. Lajuana Matte, MD - Primary    2. Ivin Poot, MD - Assisting  PHYSICIAN ASSISTANT: Lars Pinks PA-C   ANESTHESIA:   general  EBL:  Per anesthesia and perfusion record  DRAINS: Chest tubes placed in the mediastinal and pleural spaces;JP drain   COUNTS CORRECT:  YES  DICTATION: .Dragon Dictation  PLAN OF CARE: Admit to inpatient   PATIENT DISPOSITION:  ICU - intubated and critically ill.   Delay start of Pharmacological VTE agent (>24hrs) due to surgical blood loss or risk of bleeding: yes  BASELINE WEIGHT: 128 kg

## 2019-04-16 NOTE — Anesthesia Procedure Notes (Signed)
Procedure Name: Intubation Date/Time: 04/16/2019 10:50 AM Performed by: Wilburn Cornelia, CRNA Pre-anesthesia Checklist: Patient identified, Emergency Drugs available, Suction available, Patient being monitored and Timeout performed Patient Re-evaluated:Patient Re-evaluated prior to induction Oxygen Delivery Method: Circle system utilized Preoxygenation: Pre-oxygenation with 100% oxygen Induction Type: IV induction Ventilation: Mask ventilation without difficulty Laryngoscope Size: Mac and 4 Grade View: Grade I Tube type: Oral Tube size: 8.0 mm Number of attempts: 1 Airway Equipment and Method: Stylet Placement Confirmation: ETT inserted through vocal cords under direct vision,  positive ETCO2,  CO2 detector and breath sounds checked- equal and bilateral Secured at: 24 cm Tube secured with: Tape Dental Injury: Teeth and Oropharynx as per pre-operative assessment

## 2019-04-16 NOTE — ED Provider Notes (Signed)
[facility] EMERGENCY DEPARTMENT Provider Note   CSN: [id number] Arrival date & time: 04/16/19  [id number]     History   Chief Complaint Chief Complaint  Patient presents with  . Chest Pain    HPI Jerred A Branum is a 68 y.o. male past medical history of obesity, hypertension, hyper cholesterolemia, end-stage renal disease recently started on dialysis, presenting to the emergency department with chest pain and palpitations.  For symptom onset around 5:30 in the morning which woke him up from sleep.  He feels his heart palpating a thumping in his chest.  He developed pressure during breakfast.  He reports a pressure sensation is mostly resolved but he still feels some mild palpitations shortness of breath.  Denies ever feeling this before.  Denies any history of MI.  He states he had a catheterization done approximately 12 years ago and was told that his vessels were "clean".  Reports family history of MI in his father at age 70. Denies smoking history.    HPI  Past Medical History:  Diagnosis Date  . Cervical disc disease   . Chronic kidney disease   . COLONIC POLYPS, HX OF 06/27/2007  . EXOGENOUS OBESITY 01/30/2010  . GERD (gastroesophageal reflux disease)    PMH  . GOUT 01/30/2010  . HYPERCHOLESTEROLEMIA 06/30/2007  . HYPERTENSION 06/27/2007  . LOW BACK PAIN 06/27/2007  . NEPHROLITHIASIS, HX OF 06/27/2007  . OSTEOARTHRITIS 06/27/2007  . SLEEP APNEA, OBSTRUCTIVE, MODERATE 01/27/2009  . Wears dentures    upper    Patient Active Problem List   Diagnosis Date Noted  . Acute ST elevation myocardial infarction (STEMI) involving left anterior descending (LAD) coronary artery ([place])   . Acute on chronic renal failure ([place]) 08/26/2018  . Nonspecific abnormal electrocardiogram (ECG) (EKG) 08/25/2018  . Anemia in CKD (chronic kidney disease) 08/25/2018  . Elevated troponin 08/25/2018  . SOB (shortness of breath) 08/25/2018  . Acute pulmonary edema (H[facility])   . Acute renal  failure superimposed on stage 5 chronic kidney disease, not on chronic dialysis ([place]) 02/13/2018  . Morbid obesity with BMI of 45.0-49.9, adult ([place]) 12/03/2013  . Renal mass 12/02/2013  . Lower extremity edema 03/16/2013  . CKD (chronic kidney disease), stage III ([place]) 12/10/2011  . GOUT 01/30/2010  . Obstructive sleep apnea 01/27/2009  . HYPERCHOLESTEROLEMIA 06/30/2007  . Essential hypertension 06/27/2007  . Osteoarthritis 06/27/2007  . LOW BACK PAIN 06/27/2007  . History of colonic polyps 06/27/2007  . NEPHROLITHIASIS, HX OF 06/27/2007    Past Surgical History:  Procedure Laterality Date  . AV FISTULA PLACEMENT Right 01/19/2019   Procedure: RIGHT BRACHIOCEPHALIC ARTERIOVENOUS (AV) FISTULA CREATION;  Surgeon: Angelia Mould, MD;  Location: [facility];  Service: Vascular;  Laterality: Right;  . CARDIAC CATHETERIZATION    . CARPAL TUNNEL RELEASE     left hand  . COLONOSCOPY     with polypectomy  . CORONARY/GRAFT ACUTE MI REVASCULARIZATION N/A 04/16/2019   Procedure: CORONARY/GRAFT ACUTE MI REVASCULARIZATION;  Surgeon: Burnell Blanks, MD;  Location: [facility];  Service: Cardiovascular;  Laterality: N/A;  . JOINT REPLACEMENT Left 2005   knee  . LUMBAR LAMINECTOMY    . MULTIPLE TOOTH EXTRACTIONS    . RADIOFREQUENCY ABLATION KIDNEY    . RIGHT/LEFT HEART CATH AND CORONARY ANGIOGRAPHY N/A 04/16/2019   Procedure: RIGHT/LEFT HEART CATH AND CORONARY ANGIOGRAPHY;  Surgeon: Burnell Blanks, MD;  Location: [facility];  Service: Cardiovascular;  Laterality: N/A;  . TOTAL KNEE ARTHROPLASTY  left  . VENTRICULAR ASSIST DEVICE INSERTION N/A 04/16/2019   Procedure: VENTRICULAR ASSIST DEVICE INSERTION;  Surgeon: Burnell Blanks, MD;  Location: [facility];  Service: Cardiovascular;  Laterality: N/A;        Home Medications    Prior to Admission medications   Medication Sig Start Date End Date Taking? Authorizing Provider  acetaminophen  (TYLENOL) 500 MG tablet Take 1,000 mg by mouth 2 (two) times a day. Takes 1000mg  in the morning and 1000mg  in the evening.     [provider]  amLODipine (NORVASC) 10 MG tablet Take 1 tablet (10 mg total) by mouth daily. 03/03/19   [name], MD  CALCIUM CITRATE-VITAMIN D PO Take 1 tablet by mouth daily.     [provider]  carvedilol (COREG) 25 MG tablet Take 25 mg by mouth every morning.  11/22/17   [provider]  Ferrous Sulfate (IRON) 325 (65 Fe) MG TABS Take 1 tablet by mouth daily at 6 (six) AM.    [provider]  fluticasone (FLONASE) 50 MCG/ACT nasal spray Place 2 sprays into both nostrils daily. 09/16/18   [name], MD  furosemide (LASIX) 80 MG tablet Take 2 tablets (160 mg total) by mouth 2 (two) times daily. 02/05/19 03/07/19  Sherwood Gambler, MD  hydrOXYzine (ATARAX/VISTARIL) 25 MG tablet TAKE 1 TABLET (25 MG TOTAL) BY MOUTH 3 (THREE) TIMES DAILY AS NEEDED FOR ITCHING (INSOMNIA). 04/06/19   [name], MD  Multiple Vitamins-Minerals (ONE-A-DAY MENS 50+ ADVANTAGE PO) Take 1 tablet by mouth daily.    [provider]  rosuvastatin (CRESTOR) 5 MG tablet Take 1 tablet (5 mg total) by mouth daily. Patient taking differently: Take 5 mg by mouth See admin instructions. Every 3 days 08/09/17   Gale Journey, Damaris Hippo, PA-C  sodium bicarbonate 650 MG tablet Take 1 tablet (650 mg total) by mouth 2 (two) times daily. Patient taking differently: Take 1,300 mg by mouth 2 (two) times daily.  02/11/18   Gale Journey, Damaris Hippo, PA-C  terazosin (HYTRIN) 1 MG capsule Take 2 capsules (2 mg total) by mouth at bedtime. 02/09/19   [name], MD  traZODone (DESYREL) 50 MG tablet TAKE 1/2 TO 1 TABLET AT BEDTIME AS NEEDED FOR SLEEP 03/12/19   [name], MD  ULORIC 40 MG tablet Take 40 mg by mouth every other day.  11/06/16   [provider]    Family History Family History  Problem Relation Age of Onset  . Hypertension Other   . Diabetes Sister    . Hyperlipidemia Sister   . Sleep apnea Sister     Social History Social History   Tobacco Use  . Smoking status: Former Smoker    Quit date: 07/23/1980    Years since quitting: 38.7  . Smokeless tobacco: Never Used  Substance Use Topics  . Alcohol use: Yes    Alcohol/week: 1.0 standard drinks    Types: 1 Cans of beer per week    Comment: rare  . Drug use: No     Allergies   Codeine phosphate   Review of Systems Review of Systems  Constitutional: Negative for chills and fever.  Respiratory: Positive for shortness of breath. Negative for cough.   Cardiovascular: Positive for chest pain and palpitations. Negative for leg swelling.  Gastrointestinal: Negative for abdominal pain, nausea and vomiting.  Genitourinary: Negative for dysuria and hematuria.  Musculoskeletal: Negative for arthralgias and back pain.  Skin: Negative for pallor and  rash.  Neurological: Negative for syncope and light-headedness.  All other systems reviewed and are negative.    Physical Exam Updated Vital Signs BP 124/87   Pulse (!) 135   Temp (!) 97.5 F (36.4 C) (Oral)   Resp 15   Ht 6' (1.829 m)   Wt 128.8 kg   SpO2 90%   BMI 38.52 kg/m   Physical Exam Vitals signs and nursing note reviewed.  Constitutional:      Appearance: He is well-developed.  HENT:     Head: Normocephalic and atraumatic.  Eyes:     Conjunctiva/sclera: Conjunctivae normal.  Neck:     Musculoskeletal: Normal range of motion and neck supple.  Cardiovascular:     Rate and Rhythm: Normal rate and regular rhythm.     Heart sounds: No murmur.  Pulmonary:     Effort: Pulmonary effort is normal. Tachypnea present.     Breath sounds: Normal breath sounds.  Abdominal:     Palpations: Abdomen is soft.     Tenderness: There is no abdominal tenderness.  Skin:    General: Skin is warm and dry.  Neurological:     General: No focal deficit present.     Mental Status: He is alert and oriented to person, place, and  time.      ED Treatments / Results  Labs (all labs ordered are listed, but only abnormal results are displayed) Labs Reviewed  BASIC METABOLIC PANEL - Abnormal; Notable for the following components:      Result Value   Potassium 3.3 (*)    Glucose, Bld 121 (*)    BUN 58 (*)    Creatinine, Ser 7.54 (*)    Calcium 11.1 (*)    GFR calc non Af Amer 7 (*)    GFR calc Af Amer 8 (*)    Anion gap 17 (*)    All other components within normal limits  CBC - Abnormal; Notable for the following components:   RBC 2.99 (*)    Hemoglobin 8.0 (*)    HCT 25.3 (*)    All other components within normal limits  LIPID PANEL - Abnormal; Notable for the following components:   Cholesterol 201 (*)    Triglycerides 178 (*)    HDL 23 (*)    LDL Cholesterol 142 (*)    All other components within normal limits  TROPONIN I (HIGH SENSITIVITY) - Abnormal; Notable for the following components:   Troponin I (High Sensitivity) 1,297 (*)    All other components within normal limits  SARS CORONAVIRUS 2 (HOSPITAL ORDER, [facility])  PROTIME-INR  APTT  GLOBAL TEG PANEL  TYPE AND SCREEN  PREPARE RBC (CROSSMATCH)  TROPONIN I (HIGH SENSITIVITY)    EKG EKG Interpretation  Date/Time:  Thursday April 16 2019 07:38:07 EDT Ventricular Rate:  139 PR Interval:    QRS Duration: 102 QT Interval:  328 QTC Calculation: 499 R Axis:   75 Text Interpretation:  Sinus tachycardia Anterior infarct, acute (LAD) >>> Acute MI <<< ST elevations in V1-V3 and reciprocal depressions in inferior leads concerning for anterior infarct  Confirmed by Octaviano Glow [phone]) on 04/16/2019 7:52:49 AM   Radiology Dg Chest Port 1 View  Result Date: 04/16/2019 CLINICAL DATA:  Chest pain EXAM: PORTABLE CHEST 1 VIEW COMPARISON:  February 05, 2019 FINDINGS: The heart is enlarged with pulmonary vascularity within normal limits. There is no edema or consolidation. No adenopathy. There is aortic atherosclerosis.  There is arthropathy in  each shoulder. IMPRESSION: Persistent cardiac enlargement. No edema or consolidation. Pulmonary vascularity within normal limits. Aortic Atherosclerosis (ICD10-I70.0). Electronically Signed   By: Lowella Grip III M.D.   On: 04/16/2019 08:33    Procedures .Critical Care Performed by: Wyvonnia Dusky, MD Authorized by: Wyvonnia Dusky, MD   Critical care provider statement:    Critical care time (minutes):  30   Critical care was necessary to treat or prevent imminent or life-threatening deterioration of the following conditions:  Cardiac failure   Critical care was time spent personally by me on the following activities:  Discussions with consultants, evaluation of patient's response to treatment, examination of patient, ordering and performing treatments and interventions, ordering and review of laboratory studies, ordering and review of radiographic studies, pulse oximetry, re-evaluation of patient's condition, obtaining history from patient or surrogate and review of old charts Comments:     STEMI with cardiogenic shock, EKG interpretation, discussion with cardiology, IV fluids and heparin   (including critical care time)  Medications Ordered in ED Medications  sodium chloride 0.9 % bolus 1,000 mL ( Intravenous Canceled Entry 04/16/19 1035)  amiodarone (NEXTERONE PREMIX) 360-4.14 MG/200ML-% (1.8 mg/mL) IV infusion (60 mg/hr Intravenous Continued from Pre-op 04/16/19 1029)  dexmedetomidine (PRECEDEX) 400 MCG/100ML (4 mcg/mL) infusion (has no administration in time range)  DOPamine (INTROPIN) 800 mg in dextrose 5 % 250 mL (3.2 mg/mL) infusion (has no administration in time range)  milrinone (PRIMACOR) 20 MG/100 ML (0.2 mg/mL) infusion (has no administration in time range)  nitroGLYCERIN 50 mg in dextrose 5 % 250 mL (0.2 mg/mL) infusion (has no administration in time range)  phenylephrine (NEOSYNEPHRINE) 20-0.9 MG/250ML-% infusion (has no administration in time  range)  potassium chloride injection 80 mEq (has no administration in time range)  heparin 30,000 units/NS 1000 mL solution for CELLSAVER (has no administration in time range)  tranexamic acid (CYKLOKAPRON) pump prime solution 258 mg (has no administration in time range)  tranexamic acid (CYKLOKAPRON) bolus via infusion - over 30 minutes 1,932 mg (1,932 mg Intravenous Given 04/16/19 1123)  tranexamic acid (CYKLOKAPRON) 2,500 mg in sodium chloride 0.9 % 250 mL (10 mg/mL) infusion (has no administration in time range)  cefUROXime (ZINACEF) 750 mg in sodium chloride 0.9 % 100 mL IVPB (has no administration in time range)  [place] Blood Cardioplegia vial (lidocaine/magnesium/mannitol 0.26g-4g-6.4g) (has no administration in time range)  0.9 % irrigation (POUR BTL) (5,000 mLs Irrigation Given 04/16/19 1012)  Surgifoam 1 Gm with 0.9% sodium chloride (4 ml) topical solution (4 mLs Topical Given 04/16/19 1027)  amiodarone (NEXTERONE) 1.8 mg/mL load via infusion 150 mg (150 mg Intravenous Bolus from Bag 04/16/19 0841)  norepinephrine (LEVOPHED) 4 mg in dextrose 5 % 250 mL (0.016 mg/mL) infusion (20 mcg/min  Rate/Dose Change 04/16/19 0935)  insulin regular, human (MYXREDLIN) 100 units/ 100 mL infusion (2.2 Units/hr Intravenous New Bag/Given 04/16/19 1130)  EPINEPHrine (ADRENALIN) 4 mg in NS 250 mL (0.016 mg/mL) premix infusion (5 mcg/min Intravenous Rate/Dose Change 04/16/19 1110)  heparin 2,500 Units, papaverine 30 mg in electrolyte-148 (PLASMALYTE-148) 500 mL irrigation (500 mLs Irrigation Given 04/16/19 1028)  vancomycin (VANCOCIN) 1,500 mg in sodium chloride 0.9 % 250 mL IVPB (1,500 mg Intravenous New Bag/Given 04/16/19 1110)  cefUROXime (ZINACEF) 1.5 g in sodium chloride 0.9 % 100 mL IVPB (1.5 g Intravenous New Bag/Given 04/16/19 1110)  nitroGLYCERIN 100 mcg/mL intra-arterial injection (has no administration in time range)  lidocaine (PF) (XYLOCAINE) 1 % injection (has no administration in time range)   Heparin (  Porcine) in NaCl 1000-0.9 UT/500ML-% SOLN (has no administration in time range)  verapamil (ISOPTIN) 2.5 MG/ML injection (has no administration in time range)  norepinephrine (LEVOPHED) 4-5 MG/250ML-% infusion SOLN (has no administration in time range)  heparin 1000 UNIT/ML injection (has no administration in time range)  cangrelor (KENGREAL) 50 MG SOLR (has no administration in time range)  lidocaine (PF) (XYLOCAINE) 1 % injection (has no administration in time range)  amiodarone (NEXTERONE PREMIX) 360-4.14 MG/200ML-% (1.8 mg/mL) IV infusion (has no administration in time range)  propofol (DIPRIVAN) 10 mg/mL bolus/IV push (has no administration in time range)  midazolam PF (VERSED) 10 MG/2ML injection (has no administration in time range)  fentaNYL (SUBLIMAZE) 250 MCG/5ML injection (has no administration in time range)  furosemide (LASIX) 10 MG/ML injection (has no administration in time range)  fentaNYL (SUBLIMAZE) 100 MCG/2ML injection (has no administration in time range)  heparin 1000 UNIT/ML injection (has no administration in time range)     Initial Impression / Assessment and Plan / ED Course  I have reviewed the triage vital signs and the nursing notes.  Pertinent labs & imaging results that were available during my care of the patient were reviewed by me and considered in my medical decision making (see chart for details).  Patient is a 68 year old male presented to the emergency department with chest pressure and shortness of breath and tachycardia.  His EKG that I reviewed upon his arrival shows greater than 2 mm ST elevations in lead V2 and V3 with reciprocal depressions in the inferior leads.  He is tachycardic and his blood pressure is 90/60, with good mental status.  He does not appear to be in acute respiratory distress.  He has very minimal symptoms and otherwise quite well-appearing on exam.  He ready received his full dose aspirin.  We will hold nitroglycerin given  his hypotension here.  We will try to give him cautious fluids instead.  He was made a code STEMI on arrival.  Of note the patient was recently started on dialysis as well although he does continue to make urine.  He had his first session on Monday.   Clinical Course as of Apr 16 1151  Thu Apr 16, 2019  0757 Code STEMI on arrival. I spoke to our cardiologist who is requesting 4000 units heparin and states he will come see patient.   [MT]  [phone] Patient mentating well, 93% on 2L [place], HR 130's, BP 91/66, asked nursing to hold SL nitro due to hypotension   [MT]    Clinical Course User Index [MT] Okechukwu Regnier, Carola Rhine, MD   Final Clinical Impressions(s) / ED Diagnoses   Final diagnoses:  ST elevation myocardial infarction (STEMI), unspecified artery [facility])    ED Discharge Orders    None       Wyvonnia Dusky, MD 04/16/19 1152

## 2019-04-16 NOTE — Progress Notes (Signed)
  Echocardiogram TEE has been performed.  Alex Williams 04/16/2019, 11:29 AM

## 2019-04-16 NOTE — Progress Notes (Signed)
Patient ID: Alex Williams, male   DOB: Jan 26, 1951, 68 y.o.   MRN: [id number]  TCTS Evening Rounds:   Hemodynamically stable on dobut 5, epi 3, NE15, vaso 0.04 CI = 1.9 Impella on P9, flow 3.4  Has started to wake up on vent.   Urine output good  CT output low  Has doppler flow in left foot.  CBC    Component Value Date/Time   WBC 24.1 (H) 04/16/2019 1744   RBC 2.95 (L) 04/16/2019 1744   HGB 8.5 (L) 04/16/2019 1744   HGB 7.3 (L) 01/26/2019 1100   HCT 25.5 (L) 04/16/2019 1744   HCT 23.2 (L) 01/26/2019 1100   PLT 126 (L) 04/16/2019 1744   PLT 267 01/26/2019 1100   MCV 86.4 04/16/2019 1744   MCV 84 01/26/2019 1100   MCH 28.8 04/16/2019 1744   MCHC 33.3 04/16/2019 1744   RDW 14.8 04/16/2019 1744   RDW 15.2 01/26/2019 1100   LYMPHSABS 1.3 02/05/2019 0951   LYMPHSABS 2.4 08/09/2017 1100   MONOABS 0.7 02/05/2019 0951   EOSABS 0.2 02/05/2019 0951   EOSABS 0.2 08/09/2017 1100   BASOSABS 0.0 02/05/2019 0951   BASOSABS 0.1 08/09/2017 1100     BMET    Component Value Date/Time   NA 144 04/16/2019 1554   NA 142 01/30/2019 0819   K 3.7 04/16/2019 1554   CL 108 04/16/2019 1554   CO2 22 04/16/2019 0740   GLUCOSE 144 (H) 04/16/2019 1554   BUN 53 (H) 04/16/2019 1554   BUN 84 (HH) 01/30/2019 0819   CREATININE 5.90 (H) 04/16/2019 1554   CREATININE 2.19 (H) 09/23/2015 1159   CALCIUM 11.1 (H) 04/16/2019 0740   GFRNONAA 7 (L) 04/16/2019 0740   GFRNONAA 30 (L) 09/23/2015 1159   GFRAA 8 (L) 04/16/2019 0740   GFRAA 35 (L) 09/23/2015 1159     A/P:  Stable postop course. Continue current plans. Wean to extubate if he remains stable.

## 2019-04-16 NOTE — Consult Note (Deleted)
error 

## 2019-04-16 NOTE — OR Nursing (Signed)
1551 2 HEART CHARGE NURSE CALLED AND GIVEN 15 MIN. ETA.

## 2019-04-16 NOTE — Progress Notes (Signed)
  Nephrology consulted per Dr Aundra Dubin --. ESRD  Discussed with Dr Royce Macadamia.   Barrett Goldie  NP_C  2:29 PM

## 2019-04-16 NOTE — Anesthesia Postprocedure Evaluation (Signed)
Anesthesia Post Note  Patient: Alex Williams  Procedure(s) Performed: CORONARY ARTERY BYPASS GRAFTING (CABG)X3  , WITH ENDOSCOPIC HARVESTING OF RIGHT GREATER SAPHENOUS VEIN (N/A Chest) TRANSESOPHAGEAL ECHOCARDIOGRAM (TEE) (N/A )     Patient location during evaluation: SICU Anesthesia Type: General Level of consciousness: sedated Pain management: pain level controlled Vital Signs Assessment: post-procedure vital signs reviewed and stable Respiratory status: patient remains intubated per anesthesia plan Cardiovascular status: stable Postop Assessment: no apparent nausea or vomiting Anesthetic complications: no    Last Vitals:  Vitals:   04/16/19 1005 04/16/19 1010  BP: 123/89 124/87  Pulse: (!) 137 (!) 135  Resp: 15 15  Temp:    SpO2: 93% 90%    Last Pain:  Vitals:   04/16/19 1014  TempSrc:   PainSc: 0-No pain                 Bevelyn Arriola DANIEL

## 2019-04-16 NOTE — H&P (Signed)
Cardiology Admission History and Physical:   Patient ID: DELQUAN HUIBREGTSE MRN: [id number]; DOB: May 15, 1951   Admission date: 04/16/2019  Primary Care Provider: [name], MD Primary Cardiologist: No primary care provider on file. Primary Electrophysiologist:  None   Chief Complaint:  Chest pain and palpitations  Patient Profile:   Alex Williams is a 68 y.o. male with history of ESRD on HD, GERD, gout, HTN, HLD who presented to the ED with c/o chest pain and palpitations. EKG with ST elevation in the precordial leads. Code STEMI called by the ED.   History of Present Illness:   Alex Williams is a 68 yo male with history of ESRD on HD, GERD, gout, HTN, HLD who presented to the ED with c/o chest pain and palpitations. EKG with ST elevation in the precordial leads. Code STEMI called by the ED. He says he felt anxious last night all night and did not sleep. He felt his heart racing. He began to have chest pain at 5 am. He appears to be in atrial flutter with a heart rate of 140 bpm. Hgb 8.0 this am. All other labs pending.   Past Medical History:  Diagnosis Date   Cervical disc disease    Chronic kidney disease    COLONIC POLYPS, HX OF 06/27/2007   EXOGENOUS OBESITY 01/30/2010   GERD (gastroesophageal reflux disease)    PMH   GOUT 01/30/2010   HYPERCHOLESTEROLEMIA 06/30/2007   HYPERTENSION 06/27/2007   LOW BACK PAIN 06/27/2007   NEPHROLITHIASIS, HX OF 06/27/2007   OSTEOARTHRITIS 06/27/2007   SLEEP APNEA, OBSTRUCTIVE, MODERATE 01/27/2009   Wears dentures    upper    Past Surgical History:  Procedure Laterality Date   AV FISTULA PLACEMENT Right 01/19/2019   Procedure: RIGHT BRACHIOCEPHALIC ARTERIOVENOUS (AV) FISTULA CREATION;  Surgeon: Angelia Mould, MD;  Location: [facility];  Service: Vascular;  Laterality: Right;   CARDIAC CATHETERIZATION     CARPAL TUNNEL RELEASE     left hand   COLONOSCOPY     with polypectomy   JOINT REPLACEMENT Left 2005   knee   LUMBAR  LAMINECTOMY     MULTIPLE TOOTH EXTRACTIONS     RADIOFREQUENCY ABLATION KIDNEY     TOTAL KNEE ARTHROPLASTY     left     Medications Prior to Admission: Prior to Admission medications   Medication Sig Start Date End Date Taking? Authorizing Provider  acetaminophen (TYLENOL) 500 MG tablet Take 1,000 mg by mouth 2 (two) times a day. Takes 1000mg  in the morning and 1000mg  in the evening.     [provider]  amLODipine (NORVASC) 10 MG tablet Take 1 tablet (10 mg total) by mouth daily. 03/03/19   [name], MD  CALCIUM CITRATE-VITAMIN D PO Take 1 tablet by mouth daily.     [provider]  carvedilol (COREG) 25 MG tablet Take 25 mg by mouth every morning.  11/22/17   [provider]  Ferrous Sulfate (IRON) 325 (65 Fe) MG TABS Take 1 tablet by mouth daily at 6 (six) AM.    [provider]  fluticasone (FLONASE) 50 MCG/ACT nasal spray Place 2 sprays into both nostrils daily. 09/16/18   [name], MD  furosemide (LASIX) 80 MG tablet Take 2 tablets (160 mg total) by mouth 2 (two) times daily. 02/05/19 03/07/19  Sherwood Gambler, MD  hydrOXYzine (ATARAX/VISTARIL) 25 MG tablet TAKE 1 TABLET (25 MG TOTAL) BY MOUTH 3 (THREE) TIMES DAILY AS NEEDED FOR ITCHING (INSOMNIA). 04/06/19  [name], MD  Multiple Vitamins-Minerals (ONE-A-DAY MENS 50+ ADVANTAGE PO) Take 1 tablet by mouth daily.    [provider]  rosuvastatin (CRESTOR) 5 MG tablet Take 1 tablet (5 mg total) by mouth daily. Patient taking differently: Take 5 mg by mouth See admin instructions. Every 3 days 08/09/17   Gale Journey, Damaris Hippo, PA-C  sodium bicarbonate 650 MG tablet Take 1 tablet (650 mg total) by mouth 2 (two) times daily. Patient taking differently: Take 1,300 mg by mouth 2 (two) times daily.  02/11/18   Gale Journey, Damaris Hippo, PA-C  terazosin (HYTRIN) 1 MG capsule Take 2 capsules (2 mg total) by mouth at bedtime. 02/09/19   [name], MD  traZODone (DESYREL) 50 MG tablet TAKE 1/2 TO 1  TABLET AT BEDTIME AS NEEDED FOR SLEEP 03/12/19   [name], MD  ULORIC 40 MG tablet Take 40 mg by mouth every other day.  11/06/16   [provider]     Allergies:    Allergies  Allergen Reactions   Codeine Phosphate Other (See Comments)    Hyperactive     Social History:   Social History   Socioeconomic History   Marital status: Legally Separated    Spouse name: Not on file   Number of children: 2   Years of education: Not on file   Highest education level: Not on file  Occupational History   Not on file  Social Needs   Financial resource strain: Not hard at all   Food insecurity    Worry: Never true    Inability: Never true   Transportation needs    Medical: No    Non-medical: No  Tobacco Use   Smoking status: Former Smoker    Quit date: 07/23/1980    Years since quitting: 38.7   Smokeless tobacco: Never Used  Substance and Sexual Activity   Alcohol use: Yes    Alcohol/week: 1.0 standard drinks    Types: 1 Cans of beer per week    Comment: rare   Drug use: No   Sexual activity: Not Currently  Lifestyle   Physical activity    Days per week: 2 days    Minutes per session: 20 min   Stress: Not at all  Relationships   Social connections    Talks on phone: Three times a week    Gets together: More than three times a week    Attends religious service: More than 4 times per year    Active member of club or organization: No    Attends meetings of clubs or organizations: Never    Relationship status: Patient refused   Intimate partner violence    Fear of current or ex partner: No    Emotionally abused: No    Physically abused: No    Forced sexual activity: No  Other Topics Concern   Not on file  Social History Narrative   Pt lives alone. He has one son who lives within 3 miles who he usually sees daily. He has two sisters living in the next town over who he also sees on a weekly basis. He is a religious man who attends church at  least 3 Sundays each month.   He operates [employer] full time.     Family History:  The patient's family history includes Diabetes in his sister; Hyperlipidemia in his sister; Hypertension in an other family member; Sleep apnea in his sister.    ROS:  Please see the  history of present illness.  All other ROS reviewed and negative.     Physical Exam/Data:   Vitals:   04/16/19 0754 04/16/19 0755 04/16/19 0800 04/16/19 0826  BP: 91/66  97/73   Pulse: (!) 139  (!) 137   Resp: 20  19   Temp: (!) 97.5 F (36.4 C)     TempSrc: Oral     SpO2: (!) 86%  100% 98%  Weight:  128.8 kg    Height:  6' (1.829 m)     No intake or output data in the 24 hours ending 04/16/19 0827 Last 3 Weights 04/16/2019 03/03/2019 03/02/2019  Weight (lbs) 284 lb 298 lb 299 lb  Weight (kg) 128.822 kg 135.172 kg 135.626 kg     Body mass index is 38.52 kg/m.  General:  Obese male, Appears comfortable.  HEENT: normal Lymph: no adenopathy Neck: no JVD Endocrine:  No thryomegaly Vascular: No carotid bruits; FA pulses 2+ bilaterally without bruits  Cardiac:  normal S1, S2; RRR; no murmur  Lungs:  clear to auscultation bilaterally, no wheezing, rhonchi or rales  Abd: soft, nontender, no hepatomegaly  Ext: no LE edema Musculoskeletal:  No deformities, BUE and BLE strength normal and equal Skin: warm and dry  Neuro:  CNs 2-12 intact, no focal abnormalities noted Psych:  Normal affect    EKG:  The ECG that was done was personally reviewed and demonstrates atrial flutter with rate 140 bpm. ST segment elevation precordial leads.  Laboratory Data:  High Sensitivity Troponin:  No results for input(s): TROPONINIHS in the last 720 hours.    ChemistryNo results for input(s): NA, K, CL, CO2, GLUCOSE, BUN, CREATININE, CALCIUM, GFRNONAA, GFRAA, ANIONGAP in the last 168 hours.  No results for input(s): PROT, ALBUMIN, AST, ALT, ALKPHOS, BILITOT in the last 168 hours. Hematology Recent Labs  Lab 04/16/19 0740  WBC 8.8   RBC 2.99*  HGB 8.0*  HCT 25.3*  MCV 84.6  MCH 26.8  MCHC 31.6  RDW 15.4  PLT 210   BNPNo results for input(s): BNP, PROBNP in the last 168 hours.  DDimer No results for input(s): DDIMER in the last 168 hours.   Radiology/Studies:  No results found.  Assessment and Plan:   1. Atrial flutter with RVR 2. Chest pain with ST elevation on EKG  Plan emergent cardiac cath. Will attempt rate control but with soft BP will use amiodarone.   Severity of Illness: The appropriate patient status for this patient is INPATIENT. Inpatient status is judged to be reasonable and necessary in order to provide the required intensity of service to ensure the patient's safety. The patient's presenting symptoms, physical exam findings, and initial radiographic and laboratory data in the context of their chronic comorbidities is felt to place them at high risk for further clinical deterioration. Furthermore, it is not anticipated that the patient will be medically stable for discharge from the hospital within 2 midnights of admission. The following factors support the patient status of inpatient.   " The patient's presenting symptoms include chest pain, palpitations. " The worrisome physical exam findings include EKG changes c/w acute MI " The initial radiographic and laboratory data are worrisome because of ST elevation " The chronic co-morbidities include ESRD on HD, HTN, HLD, obesity   * I certify that at the point of admission it is my clinical judgment that the patient will require inpatient hospital care spanning beyond 2 midnights from the point of admission due to high intensity of service, high risk  for further deterioration and high frequency of surveillance required.*    For questions or updates, please contact [facility] Please consult [email or web address] for contact info under        Signed, Lauree Chandler, MD  04/16/2019 8:27 AM

## 2019-04-16 NOTE — Transfer of Care (Signed)
Immediate Anesthesia Transfer of Care Note  Patient: Alex Williams  Procedure(s) Performed: CORONARY ARTERY BYPASS GRAFTING (CABG)X3  , WITH ENDOSCOPIC HARVESTING OF RIGHT GREATER SAPHENOUS VEIN (N/A Chest) TRANSESOPHAGEAL ECHOCARDIOGRAM (TEE) (N/A )  Patient Location: SICU  Anesthesia Type:General  Level of Consciousness: sedated  Airway & Oxygen Therapy: Patient remains intubated per anesthesia plan and Patient placed on Ventilator (see vital sign flow sheet for setting)  Post-op Assessment: Report given to RN and Post -op Vital signs reviewed and stable  Post vital signs: Reviewed and stable  Last Vitals:  Vitals Value Taken Time  BP    Temp    Pulse    Resp    SpO2      Last Pain:  Vitals:   04/16/19 1014  TempSrc:   PainSc: 0-No pain         Complications: No apparent anesthesia complications

## 2019-04-16 NOTE — Anesthesia Preprocedure Evaluation (Addendum)
Anesthesia Evaluation  Patient identified by MRN, date of birth, ID band Patient awake    Reviewed: Allergy & Precautions, NPO status , Patient's Chart, lab work & pertinent test results, Unable to perform ROS - Chart review only  History of Anesthesia Complications Negative for: history of anesthetic complications  Airway Mallampati: III  TM Distance: >3 FB     Dental no notable dental hx. (+) Dental Advisory Given   Pulmonary sleep apnea , former smoker,    Pulmonary exam normal        Cardiovascular hypertension, + angina + CAD, + Past MI and +CHF  Normal cardiovascular exam     Neuro/Psych negative neurological ROS  negative psych ROS   GI/Hepatic Neg liver ROS, GERD  ,  Endo/Other  Morbid obesity  Renal/GU Renal disease     Musculoskeletal negative musculoskeletal ROS (+)   Abdominal   Peds  Hematology negative hematology ROS (+)   Anesthesia Other Findings Day of surgery medications reviewed with the patient.  Reproductive/Obstetrics                           Anesthesia Physical Anesthesia Plan  ASA: IV and emergent  Anesthesia Plan: General   Post-op Pain Management:    Induction: Intravenous  PONV Risk Score and Plan: 2 and Ondansetron and Dexamethasone  Airway Management Planned: Oral ETT  Additional Equipment: Arterial line and 3D TEE  Intra-op Plan:   Post-operative Plan: Post-operative intubation/ventilation  Informed Consent: I have reviewed the patients History and Physical, chart, labs and discussed the procedure including the risks, benefits and alternatives for the proposed anesthesia with the patient or authorized representative who has indicated his/her understanding and acceptance.     Dental advisory given  Plan Discussed with: Anesthesiologist, Surgeon and CRNA  Anesthesia Plan Comments: (Pt arrived with PAC in right femoral vein from cath lab.   Will utilize this during surgery.)      Anesthesia Quick Evaluation

## 2019-04-16 NOTE — ED Triage Notes (Signed)
Pt from home via [facility] for chest palpitations and dizziness upon standing 0515 this morning. Pt had dialysis for first time on Monday. Pt is A&Ox4. Pt has Pt is tachy w/ST depression and BBB on monitor. EMS gve ASA 324mg  and NS 236ml

## 2019-04-16 NOTE — Consult Note (Signed)
[facility] Renal Consultation Note  Requesting MD: Loralie Champagne, MD  Indication for Consultation:  ESRD  Chief complaint: chest pain  HPI: Alex Williams is a 68 y.o. male with a history of end-stage renal disease on hemodialysis, gout, hypertension.  Who presented to the ER with chest pain.  He was found to have a STEMI.  He was taken to the cath lab with pressures in the 99991111 systolic.  He was initiated on norepinephrine and had an Impella placed. Unable to restore flow in the cath lab.  His course was also complicated by atypical flutter and he was started on amio.  He was ultimately taken to the OR for emergent CABG today.  Has had cardiogenic shock.   He was in the OR earlier today at time of consult.  He was actually just started on dialysis earlier this week - first treatment was Monday; they state time has gradually been increased.  He has been more tired that usual recently.  Postoperatively, his levo has been at 10 mcg/min, dobutamine 5, vaso at 0.04, and epi at 3.  His levo was initially at 15 at 1700 so this is slight improvement.  He has remained intubated with FiO2 of 50% and PEEP of 5.  I spoke with his 2 children at bedside, [employer], Therapist, art.  We discussed the likely need for CRRT and they would want him to have any measures needed.  His son's number is [phone].     PMHx:   Past Medical History:  Diagnosis Date  . Cervical disc disease   . Chronic kidney disease   . COLONIC POLYPS, HX OF 06/27/2007  . EXOGENOUS OBESITY 01/30/2010  . GERD (gastroesophageal reflux disease)    PMH  . GOUT 01/30/2010  . HYPERCHOLESTEROLEMIA 06/30/2007  . HYPERTENSION 06/27/2007  . LOW BACK PAIN 06/27/2007  . NEPHROLITHIASIS, HX OF 06/27/2007  . OSTEOARTHRITIS 06/27/2007  . SLEEP APNEA, OBSTRUCTIVE, MODERATE 01/27/2009  . Wears dentures    upper    Past Surgical History:  Procedure Laterality Date  . AV FISTULA PLACEMENT Right 01/19/2019   Procedure: RIGHT  BRACHIOCEPHALIC ARTERIOVENOUS (AV) FISTULA CREATION;  Surgeon: Angelia Mould, MD;  Location: [facility];  Service: Vascular;  Laterality: Right;  . CARDIAC CATHETERIZATION    . CARPAL TUNNEL RELEASE     left hand  . COLONOSCOPY     with polypectomy  . CORONARY/GRAFT ACUTE MI REVASCULARIZATION N/A 04/16/2019   Procedure: CORONARY/GRAFT ACUTE MI REVASCULARIZATION;  Surgeon: Burnell Blanks, MD;  Location: [facility];  Service: Cardiovascular;  Laterality: N/A;  . JOINT REPLACEMENT Left 2005   knee  . LUMBAR LAMINECTOMY    . MULTIPLE TOOTH EXTRACTIONS    . RADIOFREQUENCY ABLATION KIDNEY    . RIGHT/LEFT HEART CATH AND CORONARY ANGIOGRAPHY N/A 04/16/2019   Procedure: RIGHT/LEFT HEART CATH AND CORONARY ANGIOGRAPHY;  Surgeon: Burnell Blanks, MD;  Location: [facility];  Service: Cardiovascular;  Laterality: N/A;  . TOTAL KNEE ARTHROPLASTY     left  . VENTRICULAR ASSIST DEVICE INSERTION N/A 04/16/2019   Procedure: VENTRICULAR ASSIST DEVICE INSERTION;  Surgeon: Burnell Blanks, MD;  Location: [facility];  Service: Cardiovascular;  Laterality: N/A;    Family Hx:  Family History  Problem Relation Age of Onset  . Hypertension Other   . Diabetes Sister   . Hyperlipidemia Sister   . Sleep apnea Sister     Social History:  reports that he quit  smoking about 38 years ago. He has never used smokeless tobacco. He reports current alcohol use of about 1.0 standard drinks of alcohol per week. He reports that he does not use drugs.  Allergies:  Allergies  Allergen Reactions  . Codeine Phosphate Other (See Comments)    Hyperactive     Medications: Prior to Admission medications   Medication Sig Start Date End Date Taking? Authorizing Provider  acetaminophen (TYLENOL) 500 MG tablet Take 1,000 mg by mouth 2 (two) times a day. Takes 1000mg  in the morning and 1000mg  in the evening.     [provider]  amLODipine (NORVASC) 10 MG tablet Take 1  tablet (10 mg total) by mouth daily. 03/03/19   [name], MD  CALCIUM CITRATE-VITAMIN D PO Take 1 tablet by mouth daily.     [provider]  carvedilol (COREG) 25 MG tablet Take 25 mg by mouth every morning.  11/22/17   [provider]  Ferrous Sulfate (IRON) 325 (65 Fe) MG TABS Take 1 tablet by mouth daily at 6 (six) AM.    [provider]  fluticasone (FLONASE) 50 MCG/ACT nasal spray Place 2 sprays into both nostrils daily. 09/16/18   [name], MD  furosemide (LASIX) 80 MG tablet Take 2 tablets (160 mg total) by mouth 2 (two) times daily. 02/05/19 03/07/19  Sherwood Gambler, MD  hydrOXYzine (ATARAX/VISTARIL) 25 MG tablet TAKE 1 TABLET (25 MG TOTAL) BY MOUTH 3 (THREE) TIMES DAILY AS NEEDED FOR ITCHING (INSOMNIA). 04/06/19   [name], MD  Multiple Vitamins-Minerals (ONE-A-DAY MENS 50+ ADVANTAGE PO) Take 1 tablet by mouth daily.    [provider]  rosuvastatin (CRESTOR) 5 MG tablet Take 1 tablet (5 mg total) by mouth daily. Patient taking differently: Take 5 mg by mouth See admin instructions. Every 3 days 08/09/17   Gale Journey, Damaris Hippo, PA-C  sodium bicarbonate 650 MG tablet Take 1 tablet (650 mg total) by mouth 2 (two) times daily. Patient taking differently: Take 1,300 mg by mouth 2 (two) times daily.  02/11/18   Gale Journey, Damaris Hippo, PA-C  terazosin (HYTRIN) 1 MG capsule Take 2 capsules (2 mg total) by mouth at bedtime. 02/09/19   [name], MD  traZODone (DESYREL) 50 MG tablet TAKE 1/2 TO 1 TABLET AT BEDTIME AS NEEDED FOR SLEEP 03/12/19   [name], MD  ULORIC 40 MG tablet Take 40 mg by mouth every other day.  11/06/16   [provider]    I have reviewed the patient's current and reported prior to admission medications.  His son is bringing in an updated medication list  Labs:  BMP Latest Ref Rng & Units 04/16/2019 04/16/2019 04/16/2019  Glucose 70 - 99 mg/dL 144(H) - -  BUN 8 - 23 mg/dL 53(H) - -  Creatinine 0.61 - 1.24 mg/dL  5.90(H) - -  BUN/Creat Ratio 10 - 24 - - -  Sodium 135 - 145 mmol/L 144 145 144  Potassium 3.5 - 5.1 mmol/L 3.7 3.8 3.3(L)  Chloride 98 - 111 mmol/L 108 - -  CO2 22 - 32 mmol/L - - -  Calcium 8.9 - 10.3 mg/dL - - -    ROS:  Unable to obtain given patient intubated and sedated  Physical Exam: Vitals:   04/16/19 1010 04/16/19 1708  BP: 124/87 122/83  Pulse: (!) 135 95  Resp: 15 16  Temp:    SpO2: 90% 95%     General: Elderly male intubated critically ill  HEENT: Normocephalic atraumatic Neck: Trachea midline Heart: Tachycardic on pressors Lungs: Coarse mechanical breath sounds Abdomen: Softly distended/obese; patient currently sedated; reduced bowel sounds Extremities: No edema lower extremities Skin: No rash on extremities exposed; graft site lower leg is bandaged Neuro: Patient with continuous sedation running Psych - no agitation; sedated GU foley catheter is in place  Access right upper extremity AVF with bruit and thrill    Dialysis orders:  [place]  MWF  3 hours  EDW 132 kg reported Current orders appear for 300 BF, 600 DF RUE AVF  2 K/2 Ca venofer 100 mg ordered each treatment x 4  Note Hb 8.5 on 9/21 No ESA order yet PTH 1366 on 04/15/19  Assessment/Plan:  # ESRD  - New start to dialysis this week and outpatient schedule is Monday Wednesday Friday  - No acute indication for dialysis tonight however anticipate that he will likely require CRRT given his pressor requirement -Nephrology will reassess patient tomorrow AM to guide access and dialysis plans; currently with right upper extremity AV fistula and would need a catheter should he require CRRT  # Cardiogenic shock  - On pressors per primary team   # STEMI  - s/p emergent CABG  -Per cardiology and CV surgery  # Acute on chronic systolic CHF  - Heart failure is following -No acute indication for CRRT tonight  # Atrial flutter - rate/rhythm control per cardiology   # Anemia CKD  -  Secondary in part to CKD as well as possible peri-operative losses  - Defer ESA for now with recent MI - Transfuse as needed - Check iron stores   # Secondary hyperpara - PTH 1366 on 9/23 - outpatient calcium at 11.3 on 9/23  - Hold activated vit D for now acutely today and follow trends   Claudia Desanctis 04/16/2019, 7:35 PM

## 2019-04-16 NOTE — Consult Note (Signed)
Advanced Heart Failure Team Consult Note   Primary Physician: [name], MD PCP-Cardiologist:  No primary care provider on file.  Reason for Consultation: Cardiogenic shock  HPI:    Alex Williams is seen today for evaluation of shock at the request of Dr. Angelena Form.   68 y.o. with history of ESRD (newly started on HD), gout, HTN, hyperlipidemia presented to the ER with chest pain and palpitations.  Last night, he felt his heart start to race and he had a hard time getting to sleep.  He felt anxious.  Chest pain started around 5 am and he came to ER.  He was noted to be in atypical atrial flutter in the 130s-140s with acute anterior infarction.   He was brought to the cath lab and noted to be hypotensive with SBP in 80s.  Norepinephrine was started and Impella was placed.  LHC and RHC were done:   Mean RA 15, PA 68/32 mean 45, CI 3.3.  There was 90% distal left main stenosis into the LCx, totally occluded mid LAD after diagonal, 90% mild LCx, 50% ostial RCA, 99% ostial PDA.  POBA to mid LAD but unable to restore flow.   Suspect that the LAD was a chronic occlusion and the onset of atypical aflutter with RVR drove chest pain and ischemia (via severe distal left main stenosis).  Hs-TnI returned at 1297. Hgb 8, this is chronically low.    Impella was adjusted under echo, was functioning properly with flow 3.8 L/min at P8.  Patient was started on amiodarone gtt.  He was in and out of atypical flutter while in the cath lab.  Echo showed EF 25% range with apical and peri-apical severe hypokinesis.   Patient was taken to the OR for urgent CABG with plans to bypass PDA, LCx system, and diagonal (not do not think there is a good LAD target).   Review of Systems: All systems reviewed and negative except as per HPI.   Home Medications Prior to Admission medications   Medication Sig Start Date End Date Taking? Authorizing Provider  acetaminophen (TYLENOL) 500 MG tablet Take 1,000 mg by  mouth 2 (two) times a day. Takes 1000mg  in the morning and 1000mg  in the evening.     [provider]  amLODipine (NORVASC) 10 MG tablet Take 1 tablet (10 mg total) by mouth daily. 03/03/19   [name], MD  CALCIUM CITRATE-VITAMIN D PO Take 1 tablet by mouth daily.     [provider]  carvedilol (COREG) 25 MG tablet Take 25 mg by mouth every morning.  11/22/17   [provider]  Ferrous Sulfate (IRON) 325 (65 Fe) MG TABS Take 1 tablet by mouth daily at 6 (six) AM.    [provider]  fluticasone (FLONASE) 50 MCG/ACT nasal spray Place 2 sprays into both nostrils daily. 09/16/18   [name], MD  furosemide (LASIX) 80 MG tablet Take 2 tablets (160 mg total) by mouth 2 (two) times daily. 02/05/19 03/07/19  Sherwood Gambler, MD  hydrOXYzine (ATARAX/VISTARIL) 25 MG tablet TAKE 1 TABLET (25 MG TOTAL) BY MOUTH 3 (THREE) TIMES DAILY AS NEEDED FOR ITCHING (INSOMNIA). 04/06/19   [name], MD  Multiple Vitamins-Minerals (ONE-A-DAY MENS 50+ ADVANTAGE PO) Take 1 tablet by mouth daily.    [provider]  rosuvastatin (CRESTOR) 5 MG tablet Take 1 tablet (5 mg total) by mouth daily. Patient taking differently: Take 5 mg by mouth See admin instructions. Every  3 days 08/09/17   Mancel Bale, PA-C  sodium bicarbonate 650 MG tablet Take 1 tablet (650 mg total) by mouth 2 (two) times daily. Patient taking differently: Take 1,300 mg by mouth 2 (two) times daily.  02/11/18   Gale Journey, Damaris Hippo, PA-C  terazosin (HYTRIN) 1 MG capsule Take 2 capsules (2 mg total) by mouth at bedtime. 02/09/19   [name], MD  traZODone (DESYREL) 50 MG tablet TAKE 1/2 TO 1 TABLET AT BEDTIME AS NEEDED FOR SLEEP 03/12/19   [name], MD  ULORIC 40 MG tablet Take 40 mg by mouth every other day.  11/06/16   [provider]    Past Medical History: 1. ESRD: Newly started on HD.  2. GERD 3. Gout 4. Hyperlipidemia 5. HTN 6. OSA 7. Chronic anemia: ?due to renal  disease.   Past Surgical History: Past Surgical History:  Procedure Laterality Date  . AV FISTULA PLACEMENT Right 01/19/2019   Procedure: RIGHT BRACHIOCEPHALIC ARTERIOVENOUS (AV) FISTULA CREATION;  Surgeon: Angelia Mould, MD;  Location: [facility];  Service: Vascular;  Laterality: Right;  . CARDIAC CATHETERIZATION    . CARPAL TUNNEL RELEASE     left hand  . COLONOSCOPY     with polypectomy  . CORONARY/GRAFT ACUTE MI REVASCULARIZATION N/A 04/16/2019   Procedure: CORONARY/GRAFT ACUTE MI REVASCULARIZATION;  Surgeon: Burnell Blanks, MD;  Location: [facility];  Service: Cardiovascular;  Laterality: N/A;  . JOINT REPLACEMENT Left 2005   knee  . LUMBAR LAMINECTOMY    . MULTIPLE TOOTH EXTRACTIONS    . RADIOFREQUENCY ABLATION KIDNEY    . RIGHT/LEFT HEART CATH AND CORONARY ANGIOGRAPHY N/A 04/16/2019   Procedure: RIGHT/LEFT HEART CATH AND CORONARY ANGIOGRAPHY;  Surgeon: Burnell Blanks, MD;  Location: [facility];  Service: Cardiovascular;  Laterality: N/A;  . TOTAL KNEE ARTHROPLASTY     left  . VENTRICULAR ASSIST DEVICE INSERTION N/A 04/16/2019   Procedure: VENTRICULAR ASSIST DEVICE INSERTION;  Surgeon: Burnell Blanks, MD;  Location: [facility];  Service: Cardiovascular;  Laterality: N/A;    Family History: Family History  Problem Relation Age of Onset  . Hypertension Other   . Diabetes Sister   . Hyperlipidemia Sister   . Sleep apnea Sister     Social History: Social History   Socioeconomic History  . Marital status: Legally Separated    Spouse name: Not on file  . Number of children: 2  . Years of education: Not on file  . Highest education level: Not on file  Occupational History  . Not on file  Social Needs  . Financial resource strain: Not hard at all  . Food insecurity    Worry: Never true    Inability: Never true  . Transportation needs    Medical: No    Non-medical: No  Tobacco Use  . Smoking status: Former Smoker     Quit date: 07/23/1980    Years since quitting: 38.7  . Smokeless tobacco: Never Used  Substance and Sexual Activity  . Alcohol use: Yes    Alcohol/week: 1.0 standard drinks    Types: 1 Cans of beer per week    Comment: rare  . Drug use: No  . Sexual activity: Not Currently  Lifestyle  . Physical activity    Days per week: 2 days    Minutes per session: 20 min  . Stress: Not at all  Relationships  . Social Herbalist on phone:  Three times a week    Gets together: More than three times a week    Attends religious service: More than 4 times per year    Active member of club or organization: No    Attends meetings of clubs or organizations: Never    Relationship status: Patient refused  Other Topics Concern  . Not on file  Social History Narrative   Pt lives alone. He has one son who lives within 3 miles who he usually sees daily. He has two sisters living in the next town over who he also sees on a weekly basis. He is a religious man who attends church at least 3 Sundays each month.   He operates [employer] full time.     Allergies:  Allergies  Allergen Reactions  . Codeine Phosphate Other (See Comments)    Hyperactive     Objective:    Vital Signs:   Temp:  [97.5 F (36.4 C)] 97.5 F (36.4 C) (09/24 0754) Pulse Rate:  [88-170] 135 (09/24 1010) Resp:  [4-83] 15 (09/24 1010) BP: (87-129)/(56-90) 124/87 (09/24 1010) SpO2:  [82 %-100 %] 90 % (09/24 1010) Weight:  [128.8 kg] 128.8 kg (09/24 0755)    Weight change: Filed Weights   04/16/19 0755  Weight: 128.8 kg    Intake/Output:   Intake/Output Summary (Last 24 hours) at 04/16/2019 1149 Last data filed at 04/16/2019 1134 Gross per 24 hour  Intake -  Output 40 ml  Net -40 ml      Physical Exam    General:  Well appearing. No resp difficulty HEENT: normal Neck: supple. JVP 10 cm. Carotids 2+ bilat; no bruits. No lymphadenopathy or thyromegaly appreciated. Cor: PMI nondisplaced. Tachy, regular  rate & rhythm. No rubs, gallops or murmurs. Lungs: Crackles at bases.  Abdomen: soft, nontender, nondistended. No hepatosplenomegaly. No bruits or masses. Good bowel sounds. Extremities: no cyanosis, clubbing, rash, edema Neuro: alert & orientedx3, cranial nerves grossly intact. moves all 4 extremities w/o difficulty. Affect pleasant   Telemetry   Atypical atrial flutter rate around 130. Personally reviewed.   EKG    Atypical flutter 139 with anterior MI (personally reviewed).   Labs   Basic Metabolic Panel: Recent Labs  Lab 04/16/19 0740  NA 140  K 3.3*  CL 101  CO2 22  GLUCOSE 121*  BUN 58*  CREATININE 7.54*  CALCIUM 11.1*    Liver Function Tests: No results for input(s): AST, ALT, ALKPHOS, BILITOT, PROT, ALBUMIN in the last 168 hours. No results for input(s): LIPASE, AMYLASE in the last 168 hours. No results for input(s): AMMONIA in the last 168 hours.  CBC: Recent Labs  Lab 04/16/19 0740  WBC 8.8  HGB 8.0*  HCT 25.3*  MCV 84.6  PLT 210    Cardiac Enzymes: No results for input(s): CKTOTAL, CKMB, CKMBINDEX, TROPONINI in the last 168 hours.  BNP: BNP (last 3 results) Recent Labs    08/25/18 1746 01/30/19 0819 02/05/19 0951  BNP 513.0* 1,138.2* 1,186.2*    ProBNP (last 3 results) No results for input(s): PROBNP in the last 8760 hours.   CBG: No results for input(s): GLUCAP in the last 168 hours.  Coagulation Studies: Recent Labs    04/16/19 0745  LABPROT 15.0  INR 1.2     Imaging   Dg Chest Port 1 View  Result Date: 04/16/2019 CLINICAL DATA:  Chest pain EXAM: PORTABLE CHEST 1 VIEW COMPARISON:  February 05, 2019 FINDINGS: The heart is enlarged with pulmonary vascularity within  normal limits. There is no edema or consolidation. No adenopathy. There is aortic atherosclerosis. There is arthropathy in each shoulder. IMPRESSION: Persistent cardiac enlargement. No edema or consolidation. Pulmonary vascularity within normal limits. Aortic  Atherosclerosis (ICD10-I70.0). Electronically Signed   By: Lowella Grip III M.D.   On: 04/16/2019 08:33      Medications:     Current Medications: Burgess Amor Blood Cardioplegia vial (lidocaine/magnesium/mannitol 0.26g-4g-6.4g)   Intracoronary Once  . phenylephrine  30-200 mcg/min Intravenous To OR  . potassium chloride  80 mEq Other To OR  . tranexamic acid  15 mg/kg Intravenous To OR  . tranexamic acid  2 mg/kg Intracatheter To OR     Infusions: . amiodarone 60 mg/hr (04/16/19 1029)  . cefUROXime (ZINACEF)  IV    . dexmedetomidine    . DOPamine    . heparin 30,000 units/NS 1000 mL solution for CELLSAVER    . milrinone    . nitroGLYCERIN    . [MAR Hold] sodium chloride    . tranexamic acid (CYKLOKAPRON) infusion ([facility])         Assessment/Plan   1. Shock: Suspect cardiogenic shock in setting of atypical atrial flutter with RVR and ischemia from severe left main stenosis. Required norepinephrine and Impella placed.  CI up to 3.3 when RHC done.  Impella in appropriate position with flow 3.8 L/min at P8. BP stabilized.  - Continue Impella pending OR for CABG.  - Wean norepinephrine as able.  2. CAD: Concern for acute anterior MI and patient has occluded mid LAD.  However, suspect this was CTO.  Dr. Angelena Form did angioplasty but unable to restore flow.  Patient has a 90% distal left main stenosis compromising flow in LCx and large diagonal.  Suspect this led to ischemia/chest pain when the patient went into rapid atrial flutter last night.  - Patient taken urgently for CABG supported by Impella. Should be able to bypass PDA, LCx system, and diagonal.  Probably not a target in the LAD.  - Statin and ASA post-op, will eventually need anticoagulation with atrial flutter. 3. Acute on chronic systolic CHF: Echo with EF in 25% range with peri-apical severe hypokinesis.  Suspect ischemic cardiomyopathy.  Elevated filling pressures on RHC.  He has recently started HD but still takes  Lasix.  - Lasix 80 mg IV x 1 given in cath lab.  - Will need to consult renal for HD for volume removal.  Post-op, he may need CVVH.  4. Atrial flutter: This appears atypical, by symptoms think it started last night.  He tolerates poorly, likely triggered ischemia in territory supplied by left main (90% left main stenosis, likely chronic).  - Continue amiodarone gtt, he has been in and out of atrial flutter.  - He will eventually need anticoagulation.  5. ESRD: Recent start on HD.  Will consult renal.  6. Anemia: Hgb 8 today, he has been chronically in this range.  No history of overt GI bleeding, may be related primarily to renal disease.    Length of Stay: 0  Loralie Champagne, MD  04/16/2019, 11:49 AM  Advanced Heart Failure Team Pager [phone] (M-F; 7a - 4p)  Please contact [facility] for night-coverage after hours (4p -7a ) and weekends on [email or web address]

## 2019-04-17 ENCOUNTER — Inpatient Hospital Stay (HOSPITAL_COMMUNITY): Payer: Medicare Other

## 2019-04-17 ENCOUNTER — Encounter (HOSPITAL_COMMUNITY): Payer: Self-pay | Admitting: Thoracic Surgery (Cardiothoracic Vascular Surgery)

## 2019-04-17 DIAGNOSIS — I340 Nonrheumatic mitral (valve) insufficiency: Secondary | ICD-10-CM

## 2019-04-17 DIAGNOSIS — I361 Nonrheumatic tricuspid (valve) insufficiency: Secondary | ICD-10-CM

## 2019-04-17 LAB — CBC
HCT: 22.3 % — ABNORMAL LOW (ref 39.0–52.0)
HCT: 23 % — ABNORMAL LOW (ref 39.0–52.0)
HCT: 23.8 % — ABNORMAL LOW (ref 39.0–52.0)
Hemoglobin: 7.5 g/dL — ABNORMAL LOW (ref 13.0–17.0)
Hemoglobin: 7.6 g/dL — ABNORMAL LOW (ref 13.0–17.0)
Hemoglobin: 8.1 g/dL — ABNORMAL LOW (ref 13.0–17.0)
MCH: 28.6 pg (ref 26.0–34.0)
MCH: 29.1 pg (ref 26.0–34.0)
MCH: 29.1 pg (ref 26.0–34.0)
MCHC: 32.6 g/dL (ref 30.0–36.0)
MCHC: 34 g/dL (ref 30.0–36.0)
MCHC: 34.1 g/dL (ref 30.0–36.0)
MCV: 85.4 fL (ref 80.0–100.0)
MCV: 85.6 fL (ref 80.0–100.0)
MCV: 87.8 fL (ref 80.0–100.0)
Platelets: 111 10*3/uL — ABNORMAL LOW (ref 150–400)
Platelets: 132 10*3/uL — ABNORMAL LOW (ref 150–400)
Platelets: 97 10*3/uL — ABNORMAL LOW (ref 150–400)
RBC: 2.61 MIL/uL — ABNORMAL LOW (ref 4.22–5.81)
RBC: 2.62 MIL/uL — ABNORMAL LOW (ref 4.22–5.81)
RBC: 2.78 MIL/uL — ABNORMAL LOW (ref 4.22–5.81)
RDW: 15.2 % (ref 11.5–15.5)
RDW: 15.4 % (ref 11.5–15.5)
RDW: 15.8 % — ABNORMAL HIGH (ref 11.5–15.5)
WBC: 19.7 10*3/uL — ABNORMAL HIGH (ref 4.0–10.5)
WBC: 22.2 10*3/uL — ABNORMAL HIGH (ref 4.0–10.5)
WBC: 24.2 10*3/uL — ABNORMAL HIGH (ref 4.0–10.5)
nRBC: 0 % (ref 0.0–0.2)
nRBC: 0 % (ref 0.0–0.2)
nRBC: 0.1 % (ref 0.0–0.2)

## 2019-04-17 LAB — HEMOGLOBIN AND HEMATOCRIT, BLOOD
HCT: 21.3 % — ABNORMAL LOW (ref 39.0–52.0)
Hemoglobin: 7.3 g/dL — ABNORMAL LOW (ref 13.0–17.0)

## 2019-04-17 LAB — GLUCOSE, CAPILLARY
Glucose-Capillary: 156 mg/dL — ABNORMAL HIGH (ref 70–99)
Glucose-Capillary: 154 mg/dL — ABNORMAL HIGH (ref 70–99)
Glucose-Capillary: 185 mg/dL — ABNORMAL HIGH (ref 70–99)
Glucose-Capillary: 166 mg/dL — ABNORMAL HIGH (ref 70–99)
Glucose-Capillary: 168 mg/dL — ABNORMAL HIGH (ref 70–99)

## 2019-04-17 LAB — POCT ACTIVATED CLOTTING TIME
Activated Clotting Time: 109 seconds
Activated Clotting Time: 114 seconds
Activated Clotting Time: 114 seconds
Activated Clotting Time: 120 seconds
Activated Clotting Time: 114 seconds

## 2019-04-17 LAB — POCT I-STAT 7, (LYTES, BLD GAS, ICA,H+H)
Acid-base deficit: 3 mmol/L — ABNORMAL HIGH (ref 0.0–2.0)
Acid-base deficit: 5 mmol/L — ABNORMAL HIGH (ref 0.0–2.0)
Acid-base deficit: 5 mmol/L — ABNORMAL HIGH (ref 0.0–2.0)
Bicarbonate: 22.1 mmol/L (ref 20.0–28.0)
Bicarbonate: 20.1 mmol/L (ref 20.0–28.0)
Bicarbonate: 20.1 mmol/L (ref 20.0–28.0)
Calcium, Ion: 1.42 mmol/L — ABNORMAL HIGH (ref 1.15–1.40)
Calcium, Ion: 1.46 mmol/L — ABNORMAL HIGH (ref 1.15–1.40)
Calcium, Ion: 1.47 mmol/L — ABNORMAL HIGH (ref 1.15–1.40)
HCT: 22 % — ABNORMAL LOW (ref 39.0–52.0)
HCT: 20 % — ABNORMAL LOW (ref 39.0–52.0)
HCT: 20 % — ABNORMAL LOW (ref 39.0–52.0)
Hemoglobin: 7.5 g/dL — ABNORMAL LOW (ref 13.0–17.0)
Hemoglobin: 6.8 g/dL — CL (ref 13.0–17.0)
Hemoglobin: 6.8 g/dL — CL (ref 13.0–17.0)
O2 Saturation: 92 %
O2 Saturation: 99 %
O2 Saturation: 98 %
Patient temperature: 37.8
Patient temperature: 37.6
Potassium: 3.6 mmol/L (ref 3.5–5.1)
Potassium: 3.9 mmol/L (ref 3.5–5.1)
Potassium: 4.2 mmol/L (ref 3.5–5.1)
Sodium: 145 mmol/L (ref 135–145)
Sodium: 143 mmol/L (ref 135–145)
Sodium: 142 mmol/L (ref 135–145)
TCO2: 23 mmol/L (ref 22–32)
TCO2: 21 mmol/L — ABNORMAL LOW (ref 22–32)
TCO2: 21 mmol/L — ABNORMAL LOW (ref 22–32)
pCO2 arterial: 39.9 mmHg (ref 32.0–48.0)
pCO2 arterial: 36.8 mmHg (ref 32.0–48.0)
pCO2 arterial: 36.5 mmHg (ref 32.0–48.0)
pH, Arterial: 7.351 (ref 7.350–7.450)
pH, Arterial: 7.349 — ABNORMAL LOW (ref 7.350–7.450)
pH, Arterial: 7.353 (ref 7.350–7.450)
pO2, Arterial: 68 mmHg — ABNORMAL LOW (ref 83.0–108.0)
pO2, Arterial: 152 mmHg — ABNORMAL HIGH (ref 83.0–108.0)
pO2, Arterial: 116 mmHg — ABNORMAL HIGH (ref 83.0–108.0)

## 2019-04-17 LAB — RENAL FUNCTION PANEL
Albumin: 2.6 g/dL — ABNORMAL LOW (ref 3.5–5.0)
Anion gap: 12 (ref 5–15)
BUN: 51 mg/dL — ABNORMAL HIGH (ref 8–23)
CO2: 20 mmol/L — ABNORMAL LOW (ref 22–32)
Calcium: 10.3 mg/dL (ref 8.9–10.3)
Chloride: 105 mmol/L (ref 98–111)
Creatinine, Ser: 6.17 mg/dL — ABNORMAL HIGH (ref 0.61–1.24)
GFR calc Af Amer: 10 mL/min — ABNORMAL LOW (ref >60)
GFR calc non Af Amer: 8 mL/min — ABNORMAL LOW (ref >60)
Glucose, Bld: 168 mg/dL — ABNORMAL HIGH (ref 70–99)
Phosphorus: 7.1 mg/dL — ABNORMAL HIGH (ref 2.5–4.6)
Potassium: 4.1 mmol/L (ref 3.5–5.1)
Sodium: 137 mmol/L (ref 135–145)

## 2019-04-17 LAB — IRON AND TIBC
Iron: 137 ug/dL (ref 45–182)
Saturation Ratios: 73 % — ABNORMAL HIGH (ref 17.9–39.5)
TIBC: 188 ug/dL — ABNORMAL LOW (ref 250–450)
UIBC: 51 ug/dL

## 2019-04-17 LAB — BASIC METABOLIC PANEL
Anion gap: 10 (ref 5–15)
Anion gap: 11 (ref 5–15)
Anion gap: 12 (ref 5–15)
BUN: 51 mg/dL — ABNORMAL HIGH (ref 8–23)
BUN: 54 mg/dL — ABNORMAL HIGH (ref 8–23)
BUN: 54 mg/dL — ABNORMAL HIGH (ref 8–23)
CO2: 19 mmol/L — ABNORMAL LOW (ref 22–32)
CO2: 21 mmol/L — ABNORMAL LOW (ref 22–32)
CO2: 22 mmol/L (ref 22–32)
Calcium: 10.1 mg/dL (ref 8.9–10.3)
Calcium: 10.4 mg/dL — ABNORMAL HIGH (ref 8.9–10.3)
Calcium: 9.9 mg/dL (ref 8.9–10.3)
Chloride: 107 mmol/L (ref 98–111)
Chloride: 110 mmol/L (ref 98–111)
Chloride: 110 mmol/L (ref 98–111)
Creatinine, Ser: 6.25 mg/dL — ABNORMAL HIGH (ref 0.61–1.24)
Creatinine, Ser: 6.54 mg/dL — ABNORMAL HIGH (ref 0.61–1.24)
Creatinine, Ser: 6.62 mg/dL — ABNORMAL HIGH (ref 0.61–1.24)
GFR calc Af Amer: 10 mL/min — ABNORMAL LOW (ref >60)
GFR calc Af Amer: 9 mL/min — ABNORMAL LOW (ref >60)
GFR calc Af Amer: 9 mL/min — ABNORMAL LOW (ref >60)
GFR calc non Af Amer: 8 mL/min — ABNORMAL LOW (ref >60)
GFR calc non Af Amer: 8 mL/min — ABNORMAL LOW (ref >60)
GFR calc non Af Amer: 8 mL/min — ABNORMAL LOW (ref >60)
Glucose, Bld: 148 mg/dL — ABNORMAL HIGH (ref 70–99)
Glucose, Bld: 165 mg/dL — ABNORMAL HIGH (ref 70–99)
Glucose, Bld: 167 mg/dL — ABNORMAL HIGH (ref 70–99)
Potassium: 4.1 mmol/L (ref 3.5–5.1)
Potassium: 4.2 mmol/L (ref 3.5–5.1)
Potassium: 4.3 mmol/L (ref 3.5–5.1)
Sodium: 139 mmol/L (ref 135–145)
Sodium: 141 mmol/L (ref 135–145)
Sodium: 142 mmol/L (ref 135–145)

## 2019-04-17 LAB — ECHOCARDIOGRAM COMPLETE
Height: 72 in
Weight: 4744.3 oz

## 2019-04-17 LAB — BPAM FFP
Blood Product Expiration Date: 202009272359
Blood Product Expiration Date: 202009272359
ISSUE DATE / TIME: 202009241452
ISSUE DATE / TIME: 202009241452
Unit Type and Rh: 600
Unit Type and Rh: 6200

## 2019-04-17 LAB — PREPARE FRESH FROZEN PLASMA
Unit division: 0
Unit division: 0

## 2019-04-17 LAB — MAGNESIUM
Magnesium: 1.8 mg/dL (ref 1.7–2.4)
Magnesium: 1.8 mg/dL (ref 1.7–2.4)
Magnesium: 1.9 mg/dL (ref 1.7–2.4)

## 2019-04-17 LAB — HEMOGLOBIN A1C
Hgb A1c MFr Bld: 5.5 % (ref 4.8–5.6)
Mean Plasma Glucose: 111.15 mg/dL

## 2019-04-17 LAB — APTT: aPTT: 36 seconds (ref 24–36)

## 2019-04-17 LAB — MRSA PCR SCREENING: MRSA by PCR: NEGATIVE

## 2019-04-17 LAB — PREPARE RBC (CROSSMATCH)

## 2019-04-17 LAB — FERRITIN: Ferritin: 310 ng/mL (ref 24–336)

## 2019-04-17 LAB — LACTATE DEHYDROGENASE: LDH: 1097 U/L — ABNORMAL HIGH (ref 98–192)

## 2019-04-17 MED ORDER — SODIUM CHLORIDE 0.9% FLUSH: 10.0000 mL | INTRAVENOUS | Status: DC | PRN

## 2019-04-17 MED ORDER — PRISMASOL BGK 4/2.5 32-4-2.5 MEQ/L IV SOLN
INTRAVENOUS | Status: DC
  Administered 2019-04-17 – 2019-04-24 (×47): via INTRAVENOUS_CENTRAL
  Filled 2019-04-17 (×64): qty 5000

## 2019-04-17 MED ORDER — AMIODARONE LOAD VIA INFUSION
150.0000 mg | Freq: Once | INTRAVENOUS | Status: AC
  Administered 2019-04-17: 150 mg via INTRAVENOUS
  Filled 2019-04-17: qty 83.34

## 2019-04-17 MED ORDER — SODIUM CHLORIDE 0.9% IV SOLUTION: Freq: Once | INTRAVENOUS | Status: DC

## 2019-04-17 MED ORDER — OXYCODONE HCL 5 MG PO TABS
5.0000 mg | ORAL_TABLET | Freq: Four times a day (QID) | ORAL | Status: DC | PRN
  Administered 2019-04-22 – 2019-04-30 (×11): 5 mg via ORAL
  Filled 2019-04-17 (×13): qty 1

## 2019-04-17 MED ORDER — PRISMASOL BGK 4/2.5 32-4-2.5 MEQ/L REPLACEMENT SOLN
Status: DC
  Administered 2019-04-17 – 2019-04-24 (×16): via INTRAVENOUS_CENTRAL
  Filled 2019-04-17 (×21): qty 5000

## 2019-04-17 MED ORDER — LIDOCAINE HCL (PF) 1 % IJ SOLN
INTRAMUSCULAR | Status: AC
  Administered 2019-04-17: 5 mL
  Filled 2019-04-17: qty 5

## 2019-04-17 MED ORDER — PRISMASOL BGK 4/2.5 32-4-2.5 MEQ/L REPLACEMENT SOLN
Status: DC
  Administered 2019-04-17 – 2019-04-23 (×10): via INTRAVENOUS_CENTRAL
  Filled 2019-04-17 (×13): qty 5000

## 2019-04-17 MED ORDER — HEPARIN (PORCINE) 25000 UT/250ML-% IV SOLN
200.0000 [IU]/h | INTRAVENOUS | Status: AC
  Administered 2019-04-17: 200 [IU]/h via INTRAVENOUS
  Administered 2019-04-17: 400 [IU]/h via INTRAVENOUS
  Administered 2019-04-18: 1100 [IU]/h via INTRAVENOUS
  Filled 2019-04-17 (×2): qty 250

## 2019-04-17 MED ORDER — PERFLUTREN LIPID MICROSPHERE
1.0000 mL | INTRAVENOUS | Status: AC | PRN
  Administered 2019-04-17: 3 mL via INTRAVENOUS
  Filled 2019-04-17: qty 10

## 2019-04-17 MED ORDER — FENTANYL CITRATE (PF) 100 MCG/2ML IJ SOLN
50.0000 ug | Freq: Once | INTRAMUSCULAR | Status: AC
  Administered 2019-04-17: 10:00:00 via INTRAVENOUS

## 2019-04-17 MED ORDER — PRO-STAT SUGAR FREE PO LIQD
30.0000 mL | Freq: Three times a day (TID) | ORAL | Status: DC
  Administered 2019-04-17 – 2019-04-20 (×5): 30 mL via ORAL
  Filled 2019-04-17 (×5): qty 30

## 2019-04-17 MED ORDER — HEPARIN SODIUM (PORCINE) 1000 UNIT/ML DIALYSIS
1000.0000 [IU] | Freq: Once | INTRAMUSCULAR | Status: AC
  Administered 2019-04-17: 1000 [IU] via INTRAVENOUS_CENTRAL
  Filled 2019-04-17: qty 1
  Filled 2019-04-17: qty 6

## 2019-04-17 MED ORDER — ORAL CARE MOUTH RINSE
15.0000 mL | Freq: Two times a day (BID) | OROMUCOSAL | Status: DC
  Administered 2019-04-17 – 2019-04-30 (×20): 15 mL via OROMUCOSAL

## 2019-04-17 MED ORDER — FENTANYL CITRATE (PF) 100 MCG/2ML IJ SOLN
INTRAMUSCULAR | Status: AC
  Filled 2019-04-17: qty 2

## 2019-04-17 MED ORDER — BOOST / RESOURCE BREEZE PO LIQD CUSTOM
1.0000 | Freq: Three times a day (TID) | ORAL | Status: DC
  Administered 2019-04-17 – 2019-04-20 (×5): 1 via ORAL

## 2019-04-17 MED ORDER — B COMPLEX-C PO TABS
1.0000 | ORAL_TABLET | Freq: Every day | ORAL | Status: DC
  Administered 2019-04-17 – 2019-04-30 (×14): 1 via ORAL
  Filled 2019-04-17 (×14): qty 1

## 2019-04-17 MED ORDER — HEPARIN SODIUM (PORCINE) 1000 UNIT/ML DIALYSIS: 1000.0000 [IU] | INTRAMUSCULAR | Status: DC | PRN

## 2019-04-17 MED ORDER — ATROPINE SULFATE 1 MG/10ML IJ SOSY
PREFILLED_SYRINGE | INTRAMUSCULAR | Status: AC
  Filled 2019-04-17: qty 10

## 2019-04-17 MED ORDER — SODIUM CHLORIDE 0.9% FLUSH
10.0000 mL | Freq: Two times a day (BID) | INTRAVENOUS | Status: DC
  Administered 2019-04-17 – 2019-04-21 (×5): 10 mL
  Administered 2019-04-21: 20 mL
  Administered 2019-04-22 – 2019-04-24 (×4): 10 mL
  Administered 2019-04-26 – 2019-04-27 (×2): 30 mL
  Administered 2019-04-27: 20 mL
  Administered 2019-04-27 – 2019-04-30 (×6): 10 mL

## 2019-04-17 MED FILL — Verapamil HCl IV Soln 2.5 MG/ML: INTRAVENOUS | Qty: 2 | Status: AC

## 2019-04-17 MED FILL — Nitroglycerin IV Soln 100 MCG/ML in D5W: INTRA_ARTERIAL | Qty: 10 | Status: AC

## 2019-04-17 NOTE — Progress Notes (Signed)
  Echocardiogram 2D Echocardiogram has been performed.  Alex Williams 04/17/2019, 9:34 AM

## 2019-04-17 NOTE — Progress Notes (Signed)
[facility] for Heparin  Indication: Impella CP  Allergies  Allergen Reactions  . Codeine Phosphate Other (See Comments)    Hyperactive     Patient Measurements: Height: 6' (182.9 cm) Weight: 296 lb 8.3 oz (134.5 kg) IBW/kg (Calculated) : 77.6  Vital Signs: Temp: 96.6 F (35.9 C) (09/25 2030) Temp Source: Core (09/25 2000) BP: 90/66 (09/25 1800) Pulse Rate: 98 (09/25 2030)  Labs: Recent Labs    04/16/19 0740 04/16/19 0745  04/16/19 1744  04/16/19 2344 04/17/19 0352  04/17/19 0550 04/17/19 1149 04/17/19 1544  HGB 8.0*  --    < > 8.5*   < > 8.1* 7.6*   < > 6.8* 7.3* 7.5*  HCT 25.3*  --    < > 25.5*   < > 23.8* 22.3*   < > 20.0* 21.3* 23.0*  PLT 210  --    < > 126*  --  132* 111*  --   --   --  97*  APTT  --  28  --  37*  --   --   --   --   --  36  --   LABPROT  --  15.0  --  18.1*  --   --   --   --   --   --   --   INR  --  1.2  --  1.5*  --   --   --   --   --   --   --   CREATININE 7.54*  --    < >  --   --  6.54* 6.62*  --   --   --  6.25*  6.17*  TROPONINIHS 1,297*  --   --   --   --   --   --   --   --   --   --    < > = values in this interval not displayed.    Estimated Creatinine Clearance: 15.8 mL/min (A) (by C-G formula based on SCr of 6.25 mg/dL (H)).   Medical History: Past Medical History:  Diagnosis Date  . Cervical disc disease   . Chronic kidney disease   . COLONIC POLYPS, HX OF 06/27/2007  . EXOGENOUS OBESITY 01/30/2010  . GERD (gastroesophageal reflux disease)    PMH  . GOUT 01/30/2010  . HYPERCHOLESTEROLEMIA 06/30/2007  . HYPERTENSION 06/27/2007  . LOW BACK PAIN 06/27/2007  . NEPHROLITHIASIS, HX OF 06/27/2007  . OSTEOARTHRITIS 06/27/2007  . SLEEP APNEA, OBSTRUCTIVE, MODERATE 01/27/2009  . Wears dentures    upper    Assessment: 68yom admitted for STEMI with LM disease > Impella CP placed in cath lab and then OR for CABG.  Implella purge running 11.32ml/hr  = heparin 570 uts/hr  ACT 114sec  Systemic  heparin up to 700 units/hr, per CVTS keep at this rate overnight. Hope to pull impella in am.   Goal of Therapy:  ACT 160-180 sec Monitor platelets by anticoagulation protocol: Yes   Plan:  Heparin purge  Heparin drip 700 uts/hr >>no titration overnight Monitor ACT to goal Monitor s/s bleeding  Erin Hearing PharmD., BCPS Clinical Pharmacist 04/17/2019 9:06 PM

## 2019-04-17 NOTE — Progress Notes (Signed)
[facility] NEPHROLOGY PROGRESS NOTE  Assessment/ Plan: Pt is a 68 y.o. yo male with CHF, ESRD on HD admitted with STEMI, hypotensive in the Cath Lab required pressure and Impella subsequently required emergent CABG and now cardiogenic shock on Impella.  Dialysis orders:  [facility] ,MWF,3 hours,,EDW 132 kg reported,Current orders appear for 300 BF, 600 DF,,RUE AVF ,2 K/2 Ca,,venofer 100 mg ordered each treatment x 4 ,Note Hb 8.5 on 9/21,No ESA order yet, PTH 1366 on 04/15/19  #Cardiogenic shock/STEMI: Status post emergent CABG on 9/24.  On Impella.  As per heart failure team and CT surgery.  Patient is on epinephrine, Levophed, vasopressin.  # ESRD: Recently started on dialysis with outpatient MWF schedule.  Patient with borderline low blood pressure and on multiple pressors.  Need to start CRRT.  I have discussed with the patient.  Patient agreed with the placement of dialysis catheter and initiation of CRRT.  Also discussed with Dr. Haroldine Laws who will place temporary HD catheter.  Use 4K bath, UF goal 50 to 100 cc/h as tolerated.  Already on IV heparin.  Discussed with ICU nurse  # Anemia: Due to ABLA and CKD: Transfuse blood per ICU team.  # Secondary hyperparathyroidism: -PTH 1366 on 9/23 - outpatient calcium at 11.3 on 9/23  - Hold activated vit D for now acutely today and follow trends   #Acute on chronic systolic heart failure: Starting CRRT.  Heart failure team following.  Subjective: Seen and examined in ICU.  Patient is on multiple pressor.  He was alert awake and following commands.  Agreed for initiation of CRRT. Objective Vital signs in last 24 hours: Vitals:   04/17/19 0725 04/17/19 0730 04/17/19 0745 04/17/19 0800  BP: 106/72   107/72  Pulse: 91  93 93  Resp: (!) 23 (!) 24 (!) 29 (!) 24  Temp: 99.1 F (37.3 C) 99.1 F (37.3 C) 99.1 F (37.3 C) 99.1 F (37.3 C)  TempSrc: Core   Core  SpO2:   100% 100%  Weight:      Height:       Weight change:    Intake/Output Summary (Last 24 hours) at 04/17/2019 0835 Last data filed at 04/17/2019 0800 Gross per 24 hour  Intake 6407.88 ml  Output 1820 ml  Net 4587.88 ml       Labs: Basic Metabolic Panel: Recent Labs  Lab 04/16/19 0740  04/16/19 1554  04/16/19 2344 04/17/19 0352 04/17/19 0436 04/17/19 0550  NA 140   < > 144   < > 141 142 143 142  K 3.3*   < > 3.7   < > 4.3 4.2 3.9 4.2  CL 101   < > 108  --  110 110  --   --   CO2 22  --   --   --  19* 22  --   --   GLUCOSE 121*   < > 144*  --  148* 165*  --   --   BUN 58*   < > 53*  --  54* 54*  --   --   CREATININE 7.54*   < > 5.90*  --  6.54* 6.62*  --   --   CALCIUM 11.1*  --   --   --  9.9 10.1  --   --    < > = values in this interval not displayed.   Liver Function Tests: No results for input(s): AST, ALT, ALKPHOS, BILITOT, PROT, ALBUMIN in the last  168 hours. No results for input(s): LIPASE, AMYLASE in the last 168 hours. No results for input(s): AMMONIA in the last 168 hours. CBC: Recent Labs  Lab 04/16/19 0740  04/16/19 1744  04/16/19 2344 04/17/19 0352 04/17/19 0436 04/17/19 0550  WBC 8.8  --  24.1*  --  22.2* 19.7*  --   --   HGB 8.0*   < > 8.5*   < > 8.1* 7.6* 6.8* 6.8*  HCT 25.3*   < > 25.5*   < > 23.8* 22.3* 20.0* 20.0*  MCV 84.6  --  86.4  --  85.6 85.4  --   --   PLT 210   < > 126*  --  132* 111*  --   --    < > = values in this interval not displayed.   Cardiac Enzymes: No results for input(s): CKTOTAL, CKMB, CKMBINDEX, TROPONINI in the last 168 hours. CBG: Recent Labs  Lab 04/16/19 1909 04/16/19 2012 04/16/19 2340 04/17/19 0344 04/17/19 0755  GLUCAP 99 92 134* 156* 154*    Iron Studies:  Recent Labs    04/16/19 2343  IRON 137  TIBC 188*  FERRITIN 310   Studies/Results: Dg Chest Port 1 View  Result Date: 04/16/2019 CLINICAL DATA:  Postop CABG EXAM: PORTABLE CHEST 1 VIEW COMPARISON:  04/16/2019 FINDINGS: Changes of CABG. Endotracheal tube is in place. The carina is difficult to  visualize. The tip appears to be above the carina. Left chest tube in place without pneumothorax. NG tube tip is in the distal esophagus. Cardiomegaly with vascular congestion. Bilateral effusions and bibasilar atelectasis. IMPRESSION: Postoperative changes from CABG. Left chest tube in place without pneumothorax. Carina is difficult to visualize but the endotracheal tube appears to be above the carina. Cardiomegaly, vascular congestion. Layering effusions with bibasilar atelectasis. No pneumothorax. Electronically Signed   By: Rolm Baptise M.D.   On: 04/16/2019 18:19   Dg Chest Port 1 View  Result Date: 04/16/2019 CLINICAL DATA:  Chest pain EXAM: PORTABLE CHEST 1 VIEW COMPARISON:  February 05, 2019 FINDINGS: The heart is enlarged with pulmonary vascularity within normal limits. There is no edema or consolidation. No adenopathy. There is aortic atherosclerosis. There is arthropathy in each shoulder. IMPRESSION: Persistent cardiac enlargement. No edema or consolidation. Pulmonary vascularity within normal limits. Aortic Atherosclerosis (ICD10-I70.0). Electronically Signed   By: Lowella Grip III M.D.   On: 04/16/2019 08:33   Dg Abd Portable 1v  Result Date: 04/16/2019 CLINICAL DATA:  OG tube placement EXAM: PORTABLE ABDOMEN - 1 VIEW COMPARISON:  None. FINDINGS: OG tube tip is seen in the distal stomach. IMPRESSION: OG tube tip in the distal stomach. Electronically Signed   By: Rolm Baptise M.D.   On: 04/16/2019 23:58    Medications: Infusions: .  prismasol BGK 4/2.5    .  prismasol BGK 4/2.5    . sodium chloride 20 mL/hr at 04/17/19 0800  . sodium chloride    . sodium chloride 10 mL/hr at 04/17/19 0317  . albumin human 12.5 g (04/17/19 0519)  . amiodarone 60 mg/hr (04/17/19 0800)  . cefUROXime (ZINACEF)  IV Stopped (04/17/19 0134)  . dexmedetomidine (PRECEDEX) IV infusion Stopped (04/17/19 0316)  . dextrose 5 % Impella 5.0 Purge solution Stopped (04/16/19 2026)  . DOBUTamine 4.5 mcg/kg/min  (04/17/19 0800)  . DOPamine    . epinephrine 2 mcg/min (04/17/19 0800)  . impella catheter heparin 50 unit/mL in dextrose 5%    . lactated ringers    . lactated ringers    .  lactated ringers 20 mL/hr at 04/17/19 0800  . norepinephrine (LEVOPHED) Adult infusion 2 mcg/min (04/17/19 0800)  . prismasol BGK 4/2.5    . vancomycin    . vasopressin (PITRESSIN) infusion - *FOR SHOCK* 0.04 Units/min (04/17/19 0800)    Scheduled Medications: . sodium chloride   Intravenous Once  . acetaminophen  1,000 mg Oral Q6H   Or  . acetaminophen (TYLENOL) oral liquid 160 mg/5 mL  1,000 mg Per Tube Q6H  . acetaminophen (TYLENOL) oral liquid 160 mg/5 mL  650 mg Per Tube Once   Or  . acetaminophen  650 mg Rectal Once  . aspirin EC  325 mg Oral Daily   Or  . aspirin  324 mg Per Tube Daily  . bisacodyl  10 mg Oral Daily   Or  . bisacodyl  10 mg Rectal Daily  . Chlorhexidine Gluconate Cloth  6 each Topical Daily  . docusate sodium  200 mg Oral Daily  . [START ON 04/18/2019] febuxostat  40 mg Oral QODAY  . fluticasone  2 spray Each Nare Daily  . insulin aspart  0-24 Units Subcutaneous Q4H  . mouth rinse  15 mL Mouth Rinse BID  . metoprolol tartrate  12.5 mg Oral BID   Or  . metoprolol tartrate  12.5 mg Per Tube BID  . [START ON 04/18/2019] pantoprazole  40 mg Oral Daily  . rosuvastatin  5 mg Oral q1800  . sodium bicarbonate  1,300 mg Oral BID  . sodium chloride flush  10-40 mL Intracatheter Q12H  . sodium chloride flush  3 mL Intravenous Q12H    have reviewed scheduled and prn medications.  Physical Exam: General:NAD, comfortable Heart:RRR, s1s2 nl Lungs: Surgical wound with dressing in chest, lungs clear with decreased breath sound basally, no wheezing Abdomen:soft, Non-tender, non-distended Extremities: Trace lower extremity edema  Dialysis Access: Right upper extremity AV fistula has good thrill and bruit  Dron Tanna Furry 04/17/2019,8:35 AM  LOS: 1 day  Pager: [phone]

## 2019-04-17 NOTE — Progress Notes (Signed)
RT assessed pt for readiness to wean to next phase of wean protocol. Pt able to stick out tongue, lift head off of pillow for 20+ sec, squeeze RTs hand, and wiggle toes upon request. Pt was then placed in PS/CPAP mode on 10/5 and FIO2 40%. Pt respiratory status is stable at this time. RN will draw ABG in 20 minutes to assess pts readiness to be liberated from the vent. RT will then have pt perform VC and NIF. RT will continue to monitor.

## 2019-04-17 NOTE — Progress Notes (Signed)
ANTICOAGULATION CONSULT NOTE - Initial Consult  Pharmacy Consult for Heparin  Indication: Impella CP  Allergies  Allergen Reactions  . Codeine Phosphate Other (See Comments)    Hyperactive     Patient Measurements: Height: 6' (182.9 cm) Weight: 296 lb 8.3 oz (134.5 kg) IBW/kg (Calculated) : 77.6  Vital Signs: Temp: 96.6 F (35.9 C) (09/25 1400) Temp Source: Core (09/25 1400) BP: 94/68 (09/25 1400) Pulse Rate: 90 (09/25 1400)  Labs: Recent Labs    04/16/19 0740 04/16/19 0745  04/16/19 1554  04/16/19 1744  04/16/19 2344 04/17/19 0352 04/17/19 0436 04/17/19 0550 04/17/19 1149  HGB 8.0*  --    < > 8.8*   < > 8.5*   < > 8.1* 7.6* 6.8* 6.8* 7.3*  HCT 25.3*  --    < > 26.0*   < > 25.5*   < > 23.8* 22.3* 20.0* 20.0* 21.3*  PLT 210  --    < >  --   --  126*  --  132* 111*  --   --   --   APTT  --  28  --   --   --  37*  --   --   --   --   --  36  LABPROT  --  15.0  --   --   --  18.1*  --   --   --   --   --   --   INR  --  1.2  --   --   --  1.5*  --   --   --   --   --   --   CREATININE 7.54*  --    < > 5.90*  --   --   --  6.54* 6.62*  --   --   --   TROPONINIHS 1,297*  --   --   --   --   --   --   --   --   --   --   --    < > = values in this interval not displayed.    Estimated Creatinine Clearance: 15 mL/min (A) (by C-G formula based on SCr of 6.62 mg/dL (H)).   Medical History: Past Medical History:  Diagnosis Date  . Cervical disc disease   . Chronic kidney disease   . COLONIC POLYPS, HX OF 06/27/2007  . EXOGENOUS OBESITY 01/30/2010  . GERD (gastroesophageal reflux disease)    PMH  . GOUT 01/30/2010  . HYPERCHOLESTEROLEMIA 06/30/2007  . HYPERTENSION 06/27/2007  . LOW BACK PAIN 06/27/2007  . NEPHROLITHIASIS, HX OF 06/27/2007  . OSTEOARTHRITIS 06/27/2007  . SLEEP APNEA, OBSTRUCTIVE, MODERATE 01/27/2009  . Wears dentures    upper    Assessment: 68yom admitted for STEMI with LM disease > Impella CP placed in cath lab and then OR for CABG.  Implella purge  running 66ml/hr  = heparin 600 uts/hr  ACT 114sec Little CT output - TCTS ok with starting peripheral heparin and titrate for ACT goal  CRT started 9/25 - use saline in CRT machine - no extra heparin  Goal of Therapy:  ACT 160-180 sec Monitor platelets by anticoagulation protocol: Yes   Plan:  Heparin purge  Heparin drip 400 uts/hr  Monitor ACT to goal Monitor s/s bleeding  Bonnita Nasuti Pharm.D. CPP, BCPS Clinical Pharmacist [phone] 04/17/2019 2:44 PM

## 2019-04-17 NOTE — Progress Notes (Addendum)
At 2152 pt had suction event. Decreased to P5, with resolve of suction. Pt BP 79/37(50) in afib/flutter rate low 100's. CVP 12. Increased levo, decrease pt fluid removal rate on CRRT to even.   Paged Dr. Kipp Brood regarding the above, and continued suction alarms. Orders given to keep on P5, give amio bolus and keep even on CRRT for now.   2300: Pt having suction and low placement alarms on Impella. Increasing levo requirements to maintain BP. Aline BP 47/27. Restarting epi gtt. Paged Dr. Kipp Brood again. MD coming to bedside, concern for potential placement issues, decreased to P2, order placed for STAT ECHO, contacting HF to come assess patient. While MD at bedside, assessed HD cath site, additional stitches placed and HD cath now secured.   Consulted with Dr. Haroldine Laws regarding the above- not concerned about incorrect placement, thinking more volume related and BP issues r/t to loss of atrial kick from afib/flutter. ECHO canceled. Impella placed on P4 and pt currently tolerating well with good waveforms. Orders received for repeat amio bolus and STAT CBC. Also orders for STAT CXR, 1u pRBC, and do not remove fluid on CRRT. Increasing WBC, abx changed from zinacef to zosyn- consulted with pharmacy regarding dosing.

## 2019-04-17 NOTE — Progress Notes (Addendum)
Discussed with Dr. Kipp Brood pt ACT and currently max on heparin gtt at 700 units. Orders to keep 700u as the max for systemic heparin. Still has heparin in purge solution. On 10 levo, 4.5 dobutamine. MD hopeful to remove impella tomorrow. Will continue to monitor ACTs.

## 2019-04-17 NOTE — Progress Notes (Signed)
Day shift RN relayed to this RN that trialysis catheter was loose and was giving alarms on CRRT machine this evening. CXR obtained to confirm placement. Cap on catheter is loose and may require additional stitch. Relayed concerns to CCM and asked to come see, was told to reach out to cardiology team since they placed it. Fellow came to bedside to assess. Fellow was unable to put stitch in via their scope of practice. Catheter secured with dressing and tape at this time; no alarms on CRRT at this time.   Told to reach back out if we start having alarms on CRRT. Otherwise will reassess catheter and site in AM during rounds.

## 2019-04-17 NOTE — Procedures (Signed)
Extubation Procedure Note  Patient Details:   Name: Alex Williams DOB: Sep 25, 1950 MRN: [id number]   Airway Documentation:    Vent end date: 04/17/19 Vent end time: 0448   Evaluation  O2 sats: stable throughout Complications: No apparent complications Patient did tolerate procedure well. Bilateral Breath Sounds: Clear, Diminished   Yes  Pt positive for cuff leak, NIF -28, VC 1.8L. Pt able to speak name. Pt able to expectorate well, pt placed on Riggins 4 Lpm w/humidity. No stridor noted upon extubation. Pt respiratory status is stable at this time. RT will continue to monitor.   Roby Lofts Clinten Howk 04/17/2019, 4:59 AM

## 2019-04-17 NOTE — CV Procedure (Signed)
Central Venous Catheter Insertion Procedure Note TYGER HAWKES [id number] 1950-12-13    Procedure: Insertion of Central Venous Catheter Indications: Hemodialysis   Procedure Details Consent: Risks of procedure as well as the alternatives and risks of each were explained to the (patient/caregiver).  Consent for procedure obtained. Time Out: Verified patient identification, verified procedure, site/side was marked, verified correct patient position, special equipment/implants available, medications/allergies/relevent history reviewed, required imaging and test results available.  Performed   Maximum sterile technique was used including antiseptics, cap, gloves, gown, hand hygiene, mask and sheet. Skin prep: Chlorhexidine; local anesthetic administered A antimicrobial bonded/coated 13FR 20 cm triple lumen catheter was placed in the right internal jugular vein using the Seldinger technique and u/s guidance.   Evaluation Blood flow good Complications: No apparent complications Patient did tolerate procedure well. Chest X-ray ordered to verify placement.  CXR: pending   Glori Bickers, MD  10:22 AM

## 2019-04-17 NOTE — Progress Notes (Addendum)
Advanced Heart Failure Rounding Note  PCP-Cardiologist: No primary care provider on file.   Subjective:    9/24 S/P CABG x3. Impella in place.   Currently on amio drip, vasopressin 0.4, dobutamine , epi 2 mcg, norepi 1 mcg.   Impella turned to down to P7 due to suction events. 3.0 liters. CPO 1.1, CO 7.9 CI 2.8  PA 42/17  Having some chest soreness. Denies SOB   Objective:   Weight Range: 134.5 kg Body mass index is 40.22 kg/m.   Vital Signs:   Temp:  [97.5 F (36.4 C)-100.8 F (38.2 C)] 99.5 F (37.5 C) (09/25 0615) Pulse Rate:  [88-170] 111 (09/25 0600) Resp:  [0-83] 17 (09/25 0615) BP: (87-129)/(56-90) 92/65 (09/25 0600) SpO2:  [78 %-100 %] 87 % (09/25 0600) Arterial Line BP: (83-109)/(53-71) 102/55 (09/25 0615) FiO2 (%):  [40 %-50 %] 40 % (09/25 0355) Weight:  [128.8 kg-134.5 kg] 134.5 kg (09/25 0500) Last BM Date: (PTA)  Weight change: Filed Weights   04/16/19 0755 04/17/19 0500  Weight: 128.8 kg 134.5 kg    Intake/Output:   Intake/Output Summary (Last 24 hours) at 04/17/2019 0734 Last data filed at 04/17/2019 0630 Gross per 24 hour  Intake 6042.85 ml  Output 1785 ml  Net 4257.85 ml      Physical Exam    General:   No resp difficulty HEENT: Normal Neck: Supple. JVP elevated.  . Carotids 2+ bilat; no bruits. No lymphadenopathy or thyromegaly appreciated. Cor: PMI nondisplaced. Regular rate & rhythm. No rubs, gallops or murmurs. Sternal dressing.  Lungs: Clear on nasal cannula.  Abdomen: Soft, nontender, nondistended. No hepatosplenomegaly. No bruits or masses. Good bowel sounds. Extremities: No cyanosis, clubbing, rash, edema. L foot cool but able to doppler left pedal pulse. L groin impella. RUE AVF Neuro: Alert & orientedx3, cranial nerves grossly intact. moves all 4 extremities w/o difficulty. Affect pleasant   Telemetry   SR- ST with brief episode of NSVT   EKG    N/A  Labs    CBC Recent Labs    04/16/19 2344 04/17/19 0352  04/17/19 0436 04/17/19 0550  WBC 22.2* 19.7*  --   --   HGB 8.1* 7.6* 6.8* 6.8*  HCT 23.8* 22.3* 20.0* 20.0*  MCV 85.6 85.4  --   --   PLT 132* 111*  --   --    Basic Metabolic Panel Recent Labs    04/16/19 2344 04/17/19 0352 04/17/19 0436 04/17/19 0550  NA 141 142 143 142  K 4.3 4.2 3.9 4.2  CL 110 110  --   --   CO2 19* 22  --   --   GLUCOSE 148* 165*  --   --   BUN 54* 54*  --   --   CREATININE 6.54* 6.62*  --   --   CALCIUM 9.9 10.1  --   --   MG 1.8 1.8  --   --    Liver Function Tests No results for input(s): AST, ALT, ALKPHOS, BILITOT, PROT, ALBUMIN in the last 72 hours. No results for input(s): LIPASE, AMYLASE in the last 72 hours. Cardiac Enzymes No results for input(s): CKTOTAL, CKMB, CKMBINDEX, TROPONINI in the last 72 hours.  BNP: BNP (last 3 results) Recent Labs    08/25/18 1746 01/30/19 0819 02/05/19 0951  BNP 513.0* 1,138.2* 1,186.2*    ProBNP (last 3 results) No results for input(s): PROBNP in the last 8760 hours.   D-Dimer No results for input(s): DDIMER in  the last 72 hours. Hemoglobin A1C No results for input(s): HGBA1C in the last 72 hours. Fasting Lipid Panel Recent Labs    04/16/19 0740  CHOL 201*  HDL 23*  LDLCALC 142*  TRIG 178*  CHOLHDL 8.7   Thyroid Function Tests No results for input(s): TSH, T4TOTAL, T3FREE, THYROIDAB in the last 72 hours.  Invalid input(s): FREET3  Other results:   Imaging    Dg Chest Port 1 View  Result Date: 04/16/2019 CLINICAL DATA:  Postop CABG EXAM: PORTABLE CHEST 1 VIEW COMPARISON:  04/16/2019 FINDINGS: Changes of CABG. Endotracheal tube is in place. The carina is difficult to visualize. The tip appears to be above the carina. Left chest tube in place without pneumothorax. NG tube tip is in the distal esophagus. Cardiomegaly with vascular congestion. Bilateral effusions and bibasilar atelectasis. IMPRESSION: Postoperative changes from CABG. Left chest tube in place without pneumothorax. Carina  is difficult to visualize but the endotracheal tube appears to be above the carina. Cardiomegaly, vascular congestion. Layering effusions with bibasilar atelectasis. No pneumothorax. Electronically Signed   By: Rolm Baptise M.D.   On: 04/16/2019 18:19   Dg Chest Port 1 View  Result Date: 04/16/2019 CLINICAL DATA:  Chest pain EXAM: PORTABLE CHEST 1 VIEW COMPARISON:  February 05, 2019 FINDINGS: The heart is enlarged with pulmonary vascularity within normal limits. There is no edema or consolidation. No adenopathy. There is aortic atherosclerosis. There is arthropathy in each shoulder. IMPRESSION: Persistent cardiac enlargement. No edema or consolidation. Pulmonary vascularity within normal limits. Aortic Atherosclerosis (ICD10-I70.0). Electronically Signed   By: Lowella Grip III M.D.   On: 04/16/2019 08:33   Dg Abd Portable 1v  Result Date: 04/16/2019 CLINICAL DATA:  OG tube placement EXAM: PORTABLE ABDOMEN - 1 VIEW COMPARISON:  None. FINDINGS: OG tube tip is seen in the distal stomach. IMPRESSION: OG tube tip in the distal stomach. Electronically Signed   By: Rolm Baptise M.D.   On: 04/16/2019 23:58      Medications:     Scheduled Medications: . sodium chloride   Intravenous Once  . acetaminophen  1,000 mg Oral Q6H   Or  . acetaminophen (TYLENOL) oral liquid 160 mg/5 mL  1,000 mg Per Tube Q6H  . acetaminophen (TYLENOL) oral liquid 160 mg/5 mL  650 mg Per Tube Once   Or  . acetaminophen  650 mg Rectal Once  . aspirin EC  325 mg Oral Daily   Or  . aspirin  324 mg Per Tube Daily  . bisacodyl  10 mg Oral Daily   Or  . bisacodyl  10 mg Rectal Daily  . Chlorhexidine Gluconate Cloth  6 each Topical Daily  . docusate sodium  200 mg Oral Daily  . [START ON 04/18/2019] febuxostat  40 mg Oral QODAY  . fluticasone  2 spray Each Nare Daily  . insulin aspart  0-24 Units Subcutaneous Q4H  . mouth rinse  15 mL Mouth Rinse BID  . metoprolol tartrate  12.5 mg Oral BID   Or  . metoprolol tartrate   12.5 mg Per Tube BID  . [START ON 04/18/2019] pantoprazole  40 mg Oral Daily  . rosuvastatin  5 mg Oral q1800  . sodium bicarbonate  1,300 mg Oral BID  . sodium chloride flush  10-40 mL Intracatheter Q12H  . sodium chloride flush  3 mL Intravenous Q12H  . terazosin  2 mg Oral QHS     Infusions: . sodium chloride 20 mL/hr at 04/17/19 0500  . sodium  chloride    . sodium chloride 10 mL/hr at 04/17/19 0317  . albumin human 12.5 g (04/17/19 0519)  . amiodarone 60 mg/hr (04/17/19 0500)  . cefUROXime (ZINACEF)  IV Stopped (04/17/19 0134)  . dexmedetomidine (PRECEDEX) IV infusion Stopped (04/17/19 0316)  . dextrose 5 % Impella 5.0 Purge solution Stopped (04/16/19 2026)  . DOBUTamine 4.5 mcg/kg/min (04/17/19 0500)  . DOPamine    . epinephrine 2 mcg/min (04/17/19 0500)  . impella catheter heparin 50 unit/mL in dextrose 5%    . lactated ringers    . lactated ringers    . lactated ringers 20 mL/hr at 04/17/19 0500  . norepinephrine (LEVOPHED) Adult infusion 1 mcg/min (04/17/19 0500)  . phenylephrine (NEO-SYNEPHRINE) Adult infusion    . vancomycin    . vasopressin (PITRESSIN) infusion - *FOR SHOCK* 0.04 Units/min (04/17/19 0645)     PRN Medications:  sodium chloride, albumin human, lactated ringers, metoprolol tartrate, midazolam, morphine injection, ondansetron (ZOFRAN) IV, sodium chloride flush, sodium chloride flush, traMADol   Assessment/Plan   1. Shock: Suspect cardiogenic shock in setting of atypical atrial flutter with RVR and ischemia from severe left main stenosis. Required norepinephrine and Impella placed.  CI up to 3.3 when RHC done.   Impella remains in place on pressors. Bedside ECHO today.  2. CAD: Concern for acute anterior MI and patient has occluded mid LAD.  However, suspect this was CTO.  Dr. Angelena Form did angioplasty but unable to restore flow.  Patient has a 90% distal left main stenosis compromising flow in LCx and large diagonal.  Suspect this led to ischemia/chest  pain when the patient went into rapid atrial flutter last night.  - Patient taken urgently for CABG supported by Impella. S/P CABG x3.  - On pressors wean per CT surgery. ECHO today Impeall positioning.  - Statin and ASA post-op, will eventually need anticoagulation with atrial flutter. 3. Acute on chronic systolic CHF: Echo with EF in 25% range with peri-apical severe hypokinesis.  Suspect ischemic cardiomyopathy.  Elevated filling pressures on RHC.   - Started CRRT today.  4. Atrial flutter: This appears atypical, by symptoms think it started last night.  He tolerates poorly, likely triggered ischemia in territory supplied by left main (90% left main stenosis, likely chronic).  - Continue amiodarone gtt, he has been in and out of atrial flutter.  - He will eventually need anticoagulation.  5. ESRD: Recent start on HD.  Nephrology consulted. Starting CRRT today. Will need trialysis catheter.  6. Anemia: Hgb 6.8 with expected blood loss. Receiving blood.   Length of Stay: 1  Amy Clegg, NP  04/17/2019, 7:34 AM  Advanced Heart Failure Team Pager [phone] (M-F; 7a - 4p)  Please contact [facility] for night-coverage after hours (4p -7a ) and weekends on [email or web address]  Patient seen with NP, agree with the above note.   He was extubated this morning, stable.  Good cardiac output from swan. Not short of breath, hungry.  He is in NSR without further atrial flutter.   General: NAD Neck: JVP difficult, no thyromegaly or thyroid nodule.  Lungs: Decreased BS at bases.  CV: Nonpalpable PMI.  Heart sounds distant, Impella sounds heard.  Trace ankle edema.   Abdomen: Soft, nontender, no hepatosplenomegaly, no distention.  Skin: Intact without lesions or rashes.  Neurologic: Alert and oriented x 3.  Psych: Normal affect. Extremities: Impella right groin.   HEENT: Normal.   Stable post-emergent CABG. Good cardiac output, stable BP.  - Will aim to wean  off vasopressin and epinephrine today.  - He  is on minimal NE and dobutamine as well.  - Continue Impella at P7, flow 3.0 L/min.  Position checked under echo guidance today, good placement at 3.8 cm. If pressor support weaned off, probably ready to start Impella weaning tomorrow.   No atrial flutter on amiodarone.  Will eventually need anticoagulation.   Plan for CVVH, will need catheter placed today.   Post-op anemia, will getting PRBCs.    CRITICAL CARE Performed by: Loralie Champagne  Total critical care time: 40 minutes  Critical care time was exclusive of separately billable procedures and treating other patients.  Critical care was necessary to treat or prevent imminent or life-threatening deterioration.  Critical care was time spent personally by me on the following activities: development of treatment plan with patient and/or surrogate as well as nursing, discussions with consultants, evaluation of patient's response to treatment, examination of patient, obtaining history from patient or surrogate, ordering and performing treatments and interventions, ordering and review of laboratory studies, ordering and review of radiographic studies, pulse oximetry and re-evaluation of patient's condition.   Loralie Champagne 04/17/2019 9:06 AM

## 2019-04-17 NOTE — Progress Notes (Signed)
RN obtained ABG on pt pre extubation with the following results. Pt results meet criteria for extubation. RT will proceed to extubation.  Ph 7.35 PCO2 36.8 HCO3 30.1  PaO2  152  Pt NIF -28 VC 1.8L

## 2019-04-17 NOTE — Progress Notes (Signed)
[address]       [place] [address]             [phone]                 1 Day Post-Op Procedure(s) (LRB): CORONARY ARTERY BYPASS GRAFTING (CABG)X3  , WITH ENDOSCOPIC HARVESTING OF RIGHT GREATER SAPHENOUS VEIN (N/A) TRANSESOPHAGEAL ECHOCARDIOGRAM (TEE) (N/A)   Events: Extubated overnight _______________________________________________________________ Vitals: BP 106/68   Pulse 92   Temp 98.8 F (37.1 C)   Resp (!) 21   Ht 6' (1.829 m)   Wt 134.5 kg   SpO2 100%   BMI 40.22 kg/m   - Neuro: awake, follows commands  - Cardiovascular: sinus  Drips:  Epi 2, Levo 2, Dobut 4.5, Vaso 0.02  Impella P7 3L PAP: (28-53)/(15-33) 44/20 CVP:  [5 mmHg-12 mmHg] 9 mmHg CO:  [4.7 L/min-7.5 L/min] 7.1 L/min CI:  [1.9 L/min/m2-3 L/min/m2] 2.9 L/min/m2  - Pulm: EWOB  ABG    Component Value Date/Time   PHART 7.353 04/17/2019 0550   PCO2ART 36.5 04/17/2019 0550   PO2ART 116.0 (H) 04/17/2019 0550   HCO3 20.1 04/17/2019 0550   TCO2 21 (L) 04/17/2019 0550   ACIDBASEDEF 5.0 (H) 04/17/2019 0550   O2SAT 98.0 04/17/2019 0550    - Abd: soft   .Intake/Output      09/24 0701 - 09/25 0700 09/25 0701 - 09/26 0700   I.V. (mL/kg) 4313.8 (32.1) 317.4 (2.4)   Blood 1063 335   Other 163.6 11.9   NG/GT 100    IV Piggyback 450.1    Total Intake(mL/kg) 6090.5 (45.3) 664.3 (4.9)   Urine (mL/kg/hr) 370 15 (0)   Drains 145 0   Blood 900    Chest Tube 370 20   Total Output 1785 35   Net +4305.5 +629.3           _______________________________________________________________ Labs: CBC Latest Ref Rng & Units 04/17/2019 04/17/2019 04/17/2019  WBC 4.0 - 10.5 K/uL - - 19.7(H)  Hemoglobin 13.0 - 17.0 g/dL 6.8(LL) 6.8(LL) 7.6(L)  Hematocrit 39.0 - 52.0 % 20.0(L) 20.0(L) 22.3(L)  Platelets 150 - 400 K/uL - - 111(L)   CMP Latest Ref Rng & Units 04/17/2019 04/17/2019 04/17/2019  Glucose 70 - 99 mg/dL - - 165(H)  BUN 8 - 23 mg/dL - - 54(H)  Creatinine 0.61 - 1.24 mg/dL -  - 6.62(H)  Sodium 135 - 145 mmol/L 142 143 142  Potassium 3.5 - 5.1 mmol/L 4.2 3.9 4.2  Chloride 98 - 111 mmol/L - - 110  CO2 22 - 32 mmol/L - - 22  Calcium 8.9 - 10.3 mg/dL - - 10.1  Total Protein 6.5 - 8.1 g/dL - - -  Total Bilirubin 0.3 - 1.2 mg/dL - - -  Alkaline Phos 38 - 126 U/L - - -  AST 15 - 41 U/L - - -  ALT 0 - 44 U/L - - -    CXR: Pulmonary vascular congestion  _______________________________________________________________  Assessment and Plan: POD 1 s/p CABG 3, impella placement in Cath lab following STEMI  Neuro: pain controlled CV: wean vaso, and epi today.  Will titrate Dob, and levo as we decrease impella support.  Will also need support once CRRT starts today.  Can remove femoral arterial line Pulm: pulm toilet.  Will receive dialysis today Renal: vascath placement today.  Will start CRRT GI: NPO while on vasopressor support Heme: stable.  Will start heparin gtt for Impella ID:  afebrile Endo: on insulin gtt Dispo: continue ICU care.  Melodie Bouillon, MD 04/17/2019 9:26 AM

## 2019-04-17 NOTE — Progress Notes (Signed)
RT assessed pt for readiness to wean per heart wean protocol. Pt able to hold head off of bed for 20+ sec, stick out tongue, squeeze RT hand, and wiggle toes upon request. Pt placed on SIMV/PRVC/PSV  VT 620, RR 4, PEEP 5 and FIO2 40%. Pt respiratory status stable at this time. RT will reassess pts readiness to wean to next phase in 20 minutes. RT will continue to monitor.

## 2019-04-17 NOTE — Progress Notes (Signed)
Initial Nutrition Assessment  DOCUMENTATION CODES:   Morbid obesity  INTERVENTION:   - Boost Breeze po TID, each supplement provides 250 kcal and 9 grams of protein  - Pro-stat 30 ml TID, each supplement provides 100 kcal and 15 grams of protein  - Recommend B complex with vitamin C  - RD will monitor for diet advancement and adjust TF regimen as appropriate  NUTRITION DIAGNOSIS:   Increased nutrient needs related to chronic illness as evidenced by estimated needs.  GOAL:   Patient will meet greater than or equal to 90% of their needs  MONITOR:   PO intake, Supplement acceptance, Diet advancement, Labs, Weight trends, I & O's, Skin  REASON FOR ASSESSMENT:   Rounds    ASSESSMENT:   68 year old male who presented to the ED on 9/24 with chest pain. PMH of HTN, ESRD recently started on HD, GERD, gout, HLD. Code STEMI called by ED. Pt found to have CHF.  9/24 - s/p LHC and RHC, Impella placement, urgent CABG x 3 9/25 - CRRT initiated, diet advanced to clear liquids  EDW: 132 kg  Discussed pt with RN and during ICU rounds.  Spoke with pt and daughter at bedside. Pt lethargic but able to answer RD questions.  Pt states that his appetite is moderate at this time. Noted clear liquid meal tray at bedside. Pt had consumed 25% of items on meal tray. Pt reports that he typically has a good appetite and eats anywhere between 2-4 meals daily.  RD discussed the importance of adequate kcal and protein intake at this time. Discussed increased nutrient needs related to CRRT. Pt is willing to consume Boost Breeze and Pro-stat to help him meet his kcal and protein needs while on CRRT and a clear liquid diet.  Weight trending down over the last 7 months. Pt with a 20.6 kg weight loss since 08/25/18. This is a 13.3% weight loss which is significant for timeframe. Will monitor trends.  Medications reviewed and include: Dulcolax, Colace, SSI, Protonix, sodium bicarb 1300 mg BID, amiodarone,  IV abx, dobutamine, epinephrine, heparin, levophed, vasopressin IVF: 1/2NS @ 20 ml/hr, LR @ 20 ml/hr  Labs reviewed: hemoglobin 7.3 CBG's: 92-185 x 24 hours  UOP: 370 ml x 24 hours JP drain: 145 ml x 24 hours CT: 370 ml x 24 hours  NUTRITION - FOCUSED PHYSICAL EXAM:    Most Recent Value  Orbital Region  No depletion  Upper Arm Region  No depletion  Thoracic and Lumbar Region  No depletion  Buccal Region  No depletion  Temple Region  Mild depletion  Clavicle Bone Region  Mild depletion  Clavicle and Acromion Bone Region  Mild depletion  Scapular Bone Region  Unable to assess  Dorsal Hand  No depletion  Patellar Region  No depletion  Anterior Thigh Region  No depletion  Posterior Calf Region  No depletion  Edema (RD Assessment)  Mild [generalized, BLE]  Hair  Reviewed  Eyes  Reviewed  Mouth  Reviewed  Skin  Reviewed  Nails  Reviewed       Diet Order:   Diet Order            Diet clear liquid Room service appropriate? Yes; Fluid consistency: Thin  Diet effective now              EDUCATION NEEDS:   Education needs have been addressed  Skin:  Skin Assessment: Skin Integrity Issues: Skin Integrity Issues: Incisions: left chest, right leg, right arm  Last  BM:  no documented BM  Height:   Ht Readings from Last 1 Encounters:  04/16/19 6' (1.829 m)    Weight:   Wt Readings from Last 1 Encounters:  04/17/19 134.5 kg    Ideal Body Weight:  80.9 kg  BMI:  Body mass index is 40.22 kg/m.  Estimated Nutritional Needs:   Kcal:  2400-2600  Protein:  140-160 grams  Fluid:  per MD    Gaynell Face, MS, RD, LDN Inpatient Clinical Dietitian Pager: [phone] Weekend/After Hours: [phone]

## 2019-04-18 ENCOUNTER — Inpatient Hospital Stay (HOSPITAL_COMMUNITY): Payer: Medicare Other

## 2019-04-18 LAB — RENAL FUNCTION PANEL
Albumin: 2.7 g/dL — ABNORMAL LOW (ref 3.5–5.0)
Albumin: 2.6 g/dL — ABNORMAL LOW (ref 3.5–5.0)
Anion gap: 12 (ref 5–15)
Anion gap: 11 (ref 5–15)
BUN: 37 mg/dL — ABNORMAL HIGH (ref 8–23)
BUN: 31 mg/dL — ABNORMAL HIGH (ref 8–23)
CO2: 22 mmol/L (ref 22–32)
CO2: 23 mmol/L (ref 22–32)
Calcium: 11.1 mg/dL — ABNORMAL HIGH (ref 8.9–10.3)
Calcium: 11 mg/dL — ABNORMAL HIGH (ref 8.9–10.3)
Chloride: 104 mmol/L (ref 98–111)
Chloride: 102 mmol/L (ref 98–111)
Creatinine, Ser: 4.58 mg/dL — ABNORMAL HIGH (ref 0.61–1.24)
Creatinine, Ser: 3.77 mg/dL — ABNORMAL HIGH (ref 0.61–1.24)
GFR calc Af Amer: 14 mL/min — ABNORMAL LOW (ref >60)
GFR calc Af Amer: 18 mL/min — ABNORMAL LOW (ref >60)
GFR calc non Af Amer: 12 mL/min — ABNORMAL LOW (ref >60)
GFR calc non Af Amer: 15 mL/min — ABNORMAL LOW (ref >60)
Glucose, Bld: 141 mg/dL — ABNORMAL HIGH (ref 70–99)
Glucose, Bld: 157 mg/dL — ABNORMAL HIGH (ref 70–99)
Phosphorus: 6.1 mg/dL — ABNORMAL HIGH (ref 2.5–4.6)
Phosphorus: 5.3 mg/dL — ABNORMAL HIGH (ref 2.5–4.6)
Potassium: 4 mmol/L (ref 3.5–5.1)
Potassium: 4 mmol/L (ref 3.5–5.1)
Sodium: 138 mmol/L (ref 135–145)
Sodium: 136 mmol/L (ref 135–145)

## 2019-04-18 LAB — CBC
HCT: 22.2 % — ABNORMAL LOW (ref 39.0–52.0)
HCT: 24.6 % — ABNORMAL LOW (ref 39.0–52.0)
Hemoglobin: 7.2 g/dL — ABNORMAL LOW (ref 13.0–17.0)
Hemoglobin: 8.4 g/dL — ABNORMAL LOW (ref 13.0–17.0)
MCH: 28.2 pg (ref 26.0–34.0)
MCH: 29.5 pg (ref 26.0–34.0)
MCHC: 32.4 g/dL (ref 30.0–36.0)
MCHC: 34.1 g/dL (ref 30.0–36.0)
MCV: 87.1 fL (ref 80.0–100.0)
MCV: 86.3 fL (ref 80.0–100.0)
Platelets: 114 10*3/uL — ABNORMAL LOW (ref 150–400)
Platelets: 117 10*3/uL — ABNORMAL LOW (ref 150–400)
RBC: 2.55 MIL/uL — ABNORMAL LOW (ref 4.22–5.81)
RBC: 2.85 MIL/uL — ABNORMAL LOW (ref 4.22–5.81)
RDW: 16 % — ABNORMAL HIGH (ref 11.5–15.5)
RDW: 15.6 % — ABNORMAL HIGH (ref 11.5–15.5)
WBC: 30.7 10*3/uL — ABNORMAL HIGH (ref 4.0–10.5)
WBC: 32.6 10*3/uL — ABNORMAL HIGH (ref 4.0–10.5)
nRBC: 0.2 % (ref 0.0–0.2)
nRBC: 0.2 % (ref 0.0–0.2)

## 2019-04-18 LAB — POCT I-STAT 7, (LYTES, BLD GAS, ICA,H+H)
Acid-base deficit: 4 mmol/L — ABNORMAL HIGH (ref 0.0–2.0)
Bicarbonate: 21.7 mmol/L (ref 20.0–28.0)
Calcium, Ion: 1.57 mmol/L (ref 1.15–1.40)
HCT: 24 % — ABNORMAL LOW (ref 39.0–52.0)
Hemoglobin: 8.2 g/dL — ABNORMAL LOW (ref 13.0–17.0)
O2 Saturation: 97 %
Patient temperature: 35.8
Potassium: 3.9 mmol/L (ref 3.5–5.1)
Sodium: 138 mmol/L (ref 135–145)
TCO2: 23 mmol/L (ref 22–32)
pCO2 arterial: 37.1 mmHg (ref 32.0–48.0)
pH, Arterial: 7.369 (ref 7.350–7.450)
pO2, Arterial: 86 mmHg (ref 83.0–108.0)

## 2019-04-18 LAB — POCT ACTIVATED CLOTTING TIME
Activated Clotting Time: 109 seconds
Activated Clotting Time: 114 seconds
Activated Clotting Time: 120 seconds
Activated Clotting Time: 125 seconds
Activated Clotting Time: 125 seconds
Activated Clotting Time: 125 seconds
Activated Clotting Time: 125 seconds
Activated Clotting Time: 131 seconds
Activated Clotting Time: 131 seconds
Activated Clotting Time: 131 seconds
Activated Clotting Time: 136 seconds

## 2019-04-18 LAB — LACTATE DEHYDROGENASE: LDH: 1135 U/L — ABNORMAL HIGH (ref 98–192)

## 2019-04-18 LAB — APTT: aPTT: 45 seconds — ABNORMAL HIGH (ref 24–36)

## 2019-04-18 LAB — GLUCOSE, CAPILLARY
Glucose-Capillary: 118 mg/dL — ABNORMAL HIGH (ref 70–99)
Glucose-Capillary: 142 mg/dL — ABNORMAL HIGH (ref 70–99)
Glucose-Capillary: 142 mg/dL — ABNORMAL HIGH (ref 70–99)
Glucose-Capillary: 145 mg/dL — ABNORMAL HIGH (ref 70–99)
Glucose-Capillary: 159 mg/dL — ABNORMAL HIGH (ref 70–99)
Glucose-Capillary: 174 mg/dL — ABNORMAL HIGH (ref 70–99)

## 2019-04-18 LAB — MAGNESIUM: Magnesium: 2 mg/dL (ref 1.7–2.4)

## 2019-04-18 LAB — PREPARE RBC (CROSSMATCH)

## 2019-04-18 MED ORDER — PIPERACILLIN-TAZOBACTAM IN DEX 2-0.25 GM/50ML IV SOLN
2.2500 g | Freq: Four times a day (QID) | INTRAVENOUS | Status: DC
  Administered 2019-04-18 – 2019-04-20 (×10): 2.25 g via INTRAVENOUS
  Filled 2019-04-18 (×13): qty 50

## 2019-04-18 MED ORDER — MENTHOL 3 MG MT LOZG
1.0000 | LOZENGE | OROMUCOSAL | Status: DC | PRN
  Filled 2019-04-18: qty 9

## 2019-04-18 MED ORDER — AMIODARONE LOAD VIA INFUSION
150.0000 mg | Freq: Once | INTRAVENOUS | Status: AC
  Administered 2019-04-18: 150 mg via INTRAVENOUS
  Filled 2019-04-18: qty 83.34

## 2019-04-18 MED ORDER — SODIUM CHLORIDE 0.9% IV SOLUTION
Freq: Once | INTRAVENOUS | Status: AC
  Administered 2019-04-18: 01:00:00 via INTRAVENOUS

## 2019-04-18 NOTE — Progress Notes (Signed)
[address]       [place] [address]             [phone]                 2 Days Post-Op Procedure(s) (LRB): CORONARY ARTERY BYPASS GRAFTING (CABG)X3  , WITH ENDOSCOPIC HARVESTING OF RIGHT GREATER SAPHENOUS VEIN (N/A) TRANSESOPHAGEAL ECHOCARDIOGRAM (TEE) (N/A)   Events: Suction events and hypotension overnight Back on high dose levo, and low dose epi this am Impella decreased to P4, and stopped pulling fluid _______________________________________________________________ Vitals: BP 100/69   Pulse (!) 107   Temp (!) 96.6 F (35.9 C)   Resp (!) 21   Ht 6' (1.829 m)   Wt (!) 136.3 kg   SpO2 99%   BMI 40.75 kg/m   - Neuro: awake, follows commands  - Cardiovascular: sinus  Drips:  Epi 2, Levo 24, Dobut 5, Vaso off  Impella P4 2.3L PAP: (34-53)/(11-30) 34/20 CVP:  [7 mmHg-18 mmHg] 12 mmHg CO:  [7.9 L/min-9.3 L/min] 7.9 L/min CI:  [3.2 L/min/m2-3.8 L/min/m2] 3.2 L/min/m2  - Pulm: EWOB  ABG    Component Value Date/Time   PHART 7.369 04/18/2019 0559   PCO2ART 37.1 04/18/2019 0559   PO2ART 86.0 04/18/2019 0559   HCO3 21.7 04/18/2019 0559   TCO2 23 04/18/2019 0559   ACIDBASEDEF 4.0 (H) 04/18/2019 0559   O2SAT 97.0 04/18/2019 0559    - Abd: soft - Ext: cool peripherally.  +dopplers in L PT   .Intake/Output      09/25 0701 - 09/26 0700 09/26 0701 - 09/27 0700   P.O. 400    I.V. (mL/kg) 2969.7 (21.8)    Blood 669    Other 264.3    NG/GT     IV Piggyback 267.5    Total Intake(mL/kg) 4570.6 (33.5)    Urine (mL/kg/hr) 25 (0)    Drains 50    Other 2729    Blood     Chest Tube 240    Total Output 3044    Net +1526.6            _______________________________________________________________ Labs: CBC Latest Ref Rng & Units 04/18/2019 04/18/2019 04/17/2019  WBC 4.0 - 10.5 K/uL - 32.6(H) 30.7(H)  Hemoglobin 13.0 - 17.0 g/dL 8.2(L) 8.4(L) 7.2(L)  Hematocrit 39.0 - 52.0 % 24.0(L) 24.6(L) 22.2(L)  Platelets 150 - 400 K/uL - 117(L)  114(L)   CMP Latest Ref Rng & Units 04/18/2019 04/18/2019 04/17/2019  Glucose 70 - 99 mg/dL - 141(H) 167(H)  BUN 8 - 23 mg/dL - 37(H) 51(H)  Creatinine 0.61 - 1.24 mg/dL - 4.58(H) 6.25(H)  Sodium 135 - 145 mmol/L 138 138 139  Potassium 3.5 - 5.1 mmol/L 3.9 4.0 4.1  Chloride 98 - 111 mmol/L - 104 107  CO2 22 - 32 mmol/L - 22 21(L)  Calcium 8.9 - 10.3 mg/dL - 11.1(H) 10.4(H)  Total Protein 6.5 - 8.1 g/dL - - -  Total Bilirubin 0.3 - 1.2 mg/dL - - -  Alkaline Phos 38 - 126 U/L - - -  AST 15 - 41 U/L - - -  ALT 0 - 44 U/L - - -    CXR: Pulmonary vascular congestion stable  _______________________________________________________________  Assessment and Plan: POD 2 s/p CABG 3, impella placement in Cath lab following STEMI  Neuro: pain controlled CV: back on high vaso pressor support, but decreased mechanical support.  Will restart vaso to help wean levo.  Hypotension likely a  result of intravascular depletion and atrial fibrillation.  Will continue amio gtt as well.   Pulm: pulm toilet.   Renal: CRRT but running patient even. GI: emesis this am, so NPO for now Heme: received 1 unit of blood.  On systemic heparin, and through impella purge. ID: [email or web address] Endo: on insulin gtt Dispo: continue ICU care.  Melodie Bouillon, MD 04/18/2019 7:55 AM

## 2019-04-18 NOTE — Progress Notes (Signed)
Advanced Heart Failure Rounding Note  PCP-Cardiologist: No primary care provider on file.   Subjective:    9/24 S/P CABG x3. Impella in place.   Developed AF with RVR last night. Multiple suction events and hypotension last night. Impella now at P-4. Keeping even on CVVHD. Was also febrile last night and abx changed.  Vasopressin added. Also on NE 25, dobutamine 5 and EPI 2  Denies CP or SOB. No orthopnea or PND   Swan numbers done personally with RN at bedside CVP 9 PA 37/18 (23)  PCWP 3 Thermo 7.6/3.1 SVR 770   Impella P-4 Flow 2.1. Good wave forms. VAD interrogated personally. Parameters stable.   Objective:   Weight Range: (!) 136.3 kg Body mass index is 40.75 kg/m.   Vital Signs:   Temp:  [96.3 F (35.7 C)-98.2 F (36.8 C)] 96.4 F (35.8 C) (09/26 1000) Pulse Rate:  [84-136] 107 (09/26 0800) Resp:  [14-26] 18 (09/26 1000) BP: (60-108)/(37-91) 95/82 (09/26 1000) SpO2:  [92 %-100 %] 99 % (09/26 0800) Arterial Line BP: (74-160)/(43-65) 100/49 (09/26 1000) Weight:  [136.3 kg] 136.3 kg (09/26 0600) Last BM Date: (PTA)  Weight change: Filed Weights   04/16/19 0755 04/17/19 0500 04/18/19 0600  Weight: 128.8 kg 134.5 kg (!) 136.3 kg    Intake/Output:   Intake/Output Summary (Last 24 hours) at 04/18/2019 1011 Last data filed at 04/18/2019 1000 Gross per 24 hour  Intake 4269.62 ml  Output 3448 ml  Net 821.62 ml      Physical Exam    General:  Sitting up in bed . No resp difficulty HEENT: normal Neck: supple. RIJ Trialysis. Carotids 2+ bilat; no bruits. No lymphadenopathy or thryomegaly appreciated. Cor: Sternal dressing and CT ok PMI nondisplaced. Irregular rate & rhythm. No rubs, gallops or murmurs. Lungs: clear Abdomen: obese soft, nontender, nondistended. No hepatosplenomegaly. No bruits or masses. Hypoactive bowel sounds. Extremities: no cyanosis, clubbing, rash, tr edema RUE AVF  RFV swan LFA impella Neuro: alert & orientedx3, cranial nerves  grossly intact. moves all 4 extremities w/o difficulty. Affect pleasant   Telemetry   SR- AF with RVR Personally reviewed  EKG    N/A  Labs    CBC Recent Labs    04/17/19 2328 04/18/19 0541 04/18/19 0559  WBC 30.7* 32.6*  --   HGB 7.2* 8.4* 8.2*  HCT 22.2* 24.6* 24.0*  MCV 87.1 86.3  --   PLT 114* 117*  --    Basic Metabolic Panel Recent Labs    04/17/19 1544 04/18/19 0541 04/18/19 0559  NA 139   137 138 138  K 4.1   4.1 4.0 3.9  CL 107   105 104  --   CO2 21*   20* 22  --   GLUCOSE 167*   168* 141*  --   BUN 51*   51* 37*  --   CREATININE 6.25*   6.17* 4.58*  --   CALCIUM 10.4*   10.3 11.1*  --   MG 1.9 2.0  --   PHOS 7.1* 6.1*  --    Liver Function Tests Recent Labs    04/17/19 1544 04/18/19 0541  ALBUMIN 2.6* 2.7*   No results for input(s): LIPASE, AMYLASE in the last 72 hours. Cardiac Enzymes No results for input(s): CKTOTAL, CKMB, CKMBINDEX, TROPONINI in the last 72 hours.  BNP: BNP (last 3 results) Recent Labs    08/25/18 1746 01/30/19 0819 02/05/19 0951  BNP 513.0* 1,138.2* 1,186.2*    ProBNP (  last 3 results) No results for input(s): PROBNP in the last 8760 hours.   D-Dimer No results for input(s): DDIMER in the last 72 hours. Hemoglobin A1C Recent Labs    04/17/19 0819  HGBA1C 5.5   Fasting Lipid Panel Recent Labs    04/16/19 0740  CHOL 201*  HDL 23*  LDLCALC 142*  TRIG 178*  CHOLHDL 8.7   Thyroid Function Tests No results for input(s): TSH, T4TOTAL, T3FREE, THYROIDAB in the last 72 hours.  Invalid input(s): FREET3  Other results:   Imaging    Dg Chest Port 1 View  Result Date: 04/18/2019 CLINICAL DATA:  Verify Impella placement. EXAM: PORTABLE CHEST 1 VIEW COMPARISON:  Radiograph yesterday at 6:40 p.m. FINDINGS: Tip of the right internal jugular catheter/sheath in the lower IVC. Impella device in place tip projecting over the ventricular apex, radiopaque marker in the ascending aorta. Probable Swan-Ganz  catheter from an inferior approach tip in the region the main pulmonary outflow tract. Suspected mediastinal drains and left chest tube unchanged. Unchanged cardiomegaly and mediastinal contours. Suspect left pleural effusion without visualized pneumothorax. Slight improvement in pulmonary edema. Increased right infrahilar atelectasis. IMPRESSION: 1. Impella device in appropriate position. Additional support apparatus unchanged. 2. Slight improvement in pulmonary edema from yesterday. 3. Increased right infrahilar atelectasis. Suspect small left pleural effusion. Electronically Signed   By: Keith Rake M.D.   On: 04/18/2019 01:32   Dg Chest Port 1 View  Result Date: 04/17/2019 CLINICAL DATA:  Hemodialysis catheter placement EXAM: PORTABLE CHEST 1 VIEW COMPARISON:  Earlier same day FINDINGS: Dual lumen right internal jugular catheter tip at the SVC RA junction. Aortic device remains in place with tip in the ascending aorta. Inferiorly place 1 Ganz catheter has its tip in the right main pulmonary artery. Possible mediastinal drain. Left thoracostomy tube. Slightly less pulmonary edema and better pulmonary aeration. No pneumothorax. IMPRESSION: Lines and tubes well positioned as above. Slightly less pulmonary edema. No pneumothorax. Electronically Signed   By: Nelson Chimes M.D.   On: 04/17/2019 18:55   Dg Chest Port 1 View  Result Date: 04/17/2019 CLINICAL DATA:  Catheter placement EXAM: PORTABLE CHEST 1 VIEW COMPARISON:  None. FINDINGS: There is been interval placement of a non tunneled right-sided dialysis catheter. The tip terminates near the cavoatrial junction. There is no right-sided pneumothorax. The patient is status post prior median sternotomy. The heart size remains enlarged. Again noted is an impella which appears grossly stable from prior study. There is a left-sided chest tube projecting over the left chest wall. Small bilateral pleural effusions and bibasilar atelectasis is again noted.  IMPRESSION: 1. Interval placement of a right-sided dialysis catheter without evidence for a right-sided pneumothorax. 2. Otherwise, stable appearance of the chest. Electronically Signed   By: Constance Holster M.D.   On: 04/17/2019 11:06     Medications:     Scheduled Medications:  sodium chloride   Intravenous Once   acetaminophen  1,000 mg Oral Q6H   Or   acetaminophen (TYLENOL) oral liquid 160 mg/5 mL  1,000 mg Per Tube Q6H   acetaminophen (TYLENOL) oral liquid 160 mg/5 mL  650 mg Per Tube Once   Or   acetaminophen  650 mg Rectal Once   aspirin EC  325 mg Oral Daily   Or   aspirin  324 mg Per Tube Daily   B-complex with vitamin C  1 tablet Oral Daily   bisacodyl  10 mg Oral Daily   Or   bisacodyl  10  mg Rectal Daily   Chlorhexidine Gluconate Cloth  6 each Topical Daily   docusate sodium  200 mg Oral Daily   febuxostat  40 mg Oral QODAY   feeding supplement  1 Container Oral TID BM   feeding supplement (PRO-STAT SUGAR FREE 64)  30 mL Oral TID BM   fluticasone  2 spray Each Nare Daily   insulin aspart  0-24 Units Subcutaneous Q4H   mouth rinse  15 mL Mouth Rinse BID   metoprolol tartrate  12.5 mg Oral BID   Or   metoprolol tartrate  12.5 mg Per Tube BID   pantoprazole  40 mg Oral Daily   rosuvastatin  5 mg Oral q1800   sodium bicarbonate  1,300 mg Oral BID   sodium chloride flush  10-40 mL Intracatheter Q12H   sodium chloride flush  3 mL Intravenous Q12H    Infusions:   prismasol BGK 4/2.5 500 mL/hr at 04/18/19 0836    prismasol BGK 4/2.5 300 mL/hr at 04/18/19 0516   sodium chloride 20 mL/hr at 04/18/19 1000   sodium chloride     sodium chloride 10 mL/hr at 04/18/19 0858   amiodarone 60 mg/hr (04/18/19 1000)   dexmedetomidine (PRECEDEX) IV infusion Stopped (04/17/19 0316)   dextrose 5 % Impella 5.0 Purge solution Stopped (04/16/19 2026)   DOBUTamine 2.5 mcg/kg/min (04/18/19 1000)   DOPamine     epinephrine 3 mcg/min (04/18/19  1000)   impella catheter heparin 50 unit/mL in dextrose 5%     heparin 900 Units/hr (04/18/19 1010)   lactated ringers     lactated ringers     lactated ringers 20 mL/hr at 04/18/19 1000   norepinephrine (LEVOPHED) Adult infusion 24 mcg/min (04/18/19 1000)   piperacillin-tazobactam (ZOSYN)  IV Stopped (04/18/19 0230)   prismasol BGK 4/2.5 1,500 mL/hr at 04/18/19 0840   vasopressin (PITRESSIN) infusion - *FOR SHOCK* 0.04 Units/min (04/18/19 1000)    PRN Medications: sodium chloride, lactated ringers, menthol-cetylpyridinium, metoprolol tartrate, midazolam, morphine injection, ondansetron (ZOFRAN) IV, oxyCODONE, sodium chloride flush, sodium chloride flush, traMADol   Assessment/Plan   1. Shock: Suspect cardiogenic shock in setting of atypical atrial flutter with RVR and ischemia from severe left main stenosis. May have developed a bit of sepsis component on 9/25 -  Echo 9/25 EF 30% -  Now on NE 25, dobutamine 5, epi 2, VP 0.03. Impella P-4 - Hemodynamics look good. Cut dobutamine to 2.5. Wean NE - Impella placement looks good on echo - Likely pull impella in am - Heparin dosing d/w PharmD  Shoot for ACT 130-140 - D/w Dr/ Kipp Brood personally  2. CAD: Concern for acute anterior MI and patient has occluded mid LAD.  However, suspect this was CTO.  Dr. Angelena Form did angioplasty but unable to restore flow.  Patient has a 90% distal left main stenosis compromising flow in LCx and large diagonal.  Suspect this led to ischemia/chest pain when the patient went into rapid atrial flutter last night.  - Patient taken urgently for CABG supported by Impella. S/P CABG x3.  - Statin and ASA post-op, will eventually need anticoagulation with atrial flutter. - Hold b-blocker for now while on pressors  3. Acute on chronic systolic CHF: Echo with EF in 25% range with peri-apical severe hypokinesis.  Suspect ischemic cardiomyopathy.  Elevated filling pressures on RHC.   - Echo EF 30% - On CRRT  today. Volume status ok. Keeping even. D/w Renal team at bedside\  4. Paroxysmal Atrial flutter/fib: This appears atypical, by  symptoms think it started last night.  He tolerates poorly, likely triggered ischemia in territory supplied by left main (90% left main stenosis, likely chronic).  - Continue amiodarone gtt, he has been in and out of atrial flutter.  - He will eventually need anticoagulation. Now on heparin   5. ESRD: Recent start on HD.  Nephrology following. On CVVD. Keep even   6. ID - febrile o/n. WBC 32k - abx broadened - cx NGTD  7. Anemia: Hgb 8.2 with expected blood loss. Received 1u overnight  CRITICAL CARE Performed by: Glori Bickers  Total critical care time: 45 minutes  Critical care time was exclusive of separately billable procedures and treating other patients.  Critical care was necessary to treat or prevent imminent or life-threatening deterioration.  Critical care was time spent personally by me (independent of midlevel providers or residents) on the following activities: development of treatment plan with patient and/or surrogate as well as nursing, discussions with consultants, evaluation of patient's response to treatment, examination of patient, obtaining history from patient or surrogate, ordering and performing treatments and interventions, ordering and review of laboratory studies, ordering and review of radiographic studies, pulse oximetry and re-evaluation of patient's condition.    Length of Stay: 2  Glori Bickers, MD  04/18/2019, 10:11 AM  Advanced Heart Failure Team Pager [phone] (M-F; 7a - 4p)  Please contact [facility] for night-coverage after hours (4p -7a ) and weekends on [email or web address]

## 2019-04-18 NOTE — Progress Notes (Addendum)
ANTICOAGULATION CONSULT NOTE - Follow Up Consult  Pharmacy Consult for Heparin  Indication: Impella CP  Allergies  Allergen Reactions  . Codeine Phosphate Other (See Comments)    Hyperactive     Patient Measurements: Height: 6' (182.9 cm) Weight: (!) 300 lb 7.8 oz (136.3 kg) IBW/kg (Calculated) : 77.6  Vital Signs: Temp: 96.4 F (35.8 C) (09/26 1300) Temp Source: Core (09/26 1200) BP: 101/75 (09/26 1300) Pulse Rate: 102 (09/26 1300)  Labs: Recent Labs    04/16/19 0740  04/16/19 0745  04/16/19 1744  04/17/19 0352  04/17/19 1149 04/17/19 1544 04/17/19 2328 04/18/19 0541 04/18/19 0559  HGB 8.0*  --   --    < > 8.5*   < > 7.6*   < > 7.3* 7.5* 7.2* 8.4* 8.2*  HCT 25.3*  --   --    < > 25.5*   < > 22.3*   < > 21.3* 23.0* 22.2* 24.6* 24.0*  PLT 210  --   --    < > 126*   < > 111*  --   --  97* 114* 117*  --   APTT  --    < > 28  --  37*  --   --   --  36  --   --  45*  --   LABPROT  --   --  15.0  --  18.1*  --   --   --   --   --   --   --   --   INR  --   --  1.2  --  1.5*  --   --   --   --   --   --   --   --   CREATININE 7.54*  --   --    < >  --    < > 6.62*  --   --  6.25*  6.17*  --  4.58*  --   TROPONINIHS 1,297*  --   --   --   --   --   --   --   --   --   --   --   --    < > = values in this interval not displayed.    Estimated Creatinine Clearance: 21.8 mL/min (A) (by C-G formula based on SCr of 4.58 mg/dL (H)).   Medical History: Past Medical History:  Diagnosis Date  . Cervical disc disease   . Chronic kidney disease   . COLONIC POLYPS, HX OF 06/27/2007  . EXOGENOUS OBESITY 01/30/2010  . GERD (gastroesophageal reflux disease)    PMH  . GOUT 01/30/2010  . HYPERCHOLESTEROLEMIA 06/30/2007  . HYPERTENSION 06/27/2007  . LOW BACK PAIN 06/27/2007  . NEPHROLITHIASIS, HX OF 06/27/2007  . OSTEOARTHRITIS 06/27/2007  . SLEEP APNEA, OBSTRUCTIVE, MODERATE 01/27/2009  . Wears dentures    upper    Assessment: 68yom admitted for STEMI with LM disease > Impella CP  placed in cath lab and then OR for CABG.  Implella purge running 11.33ml/hr  = heparin 570 uts/hr. Systemic heparin up to 1100 units/hr.  ACT 125sec  Impella likely to be pulled tomorrow AM  Goal of Therapy:  ACT 130-140 sec per MD Monitor platelets by anticoagulation protocol: Yes   Plan:  Heparin purge  Heparin drip 1100 uts/hr >> no further titration without discussing with HF and TCTS first Monitor ACT to lowered goal Monitor s/s bleeding Stop heparin around 0600 tomorrow in anticipation for  Impella pull  Vertis Kelch, PharmD PGY2 Cardiology Pharmacy Resident Phone [phone] 04/18/2019       2:13 PM  Please check [email or web address] for unit-specific pharmacist phone numbers

## 2019-04-18 NOTE — Progress Notes (Signed)
Hourly ACT's resumed per Dr Haroldine Laws ACT goal of 130 is an acceptable range for patient. Will continue to assess.

## 2019-04-18 NOTE — Progress Notes (Signed)
1u pRBC obtained from blood bank. Pt is typed O-neg, ordered transfuse blood is O-positive. Discussed with Sharene Skeans in [facility] blood bank lab about compatibility, she confirmed that it safe to transfuse and this can be done with adult male patients requiring blood transfusions. Discussed with Dr. Zacarias Pontes as well, this can be done in emergent situations but run transfusion slow and monitor closely for reactions.

## 2019-04-18 NOTE — Progress Notes (Addendum)
[facility] NEPHROLOGY PROGRESS NOTE  Summary Pt is a 68 y.o. yo male with CHF, ESRD on HD admitted with STEMI, hypotensive in the Cath Lab required pressure and Impella subsequently required emergent CABG and now cardiogenic shock on Impella.  Physical Exam: General:NAD, comfortable Heart:RRR, s1s2 nl Lungs: Surgical wound with dressing in chest, lungs clear with decreased breath sound basally, no wheezing Abdomen:soft, Non-tender, non-distended Extremities: Trace lower extremity edema  Dialysis Access: Right upper extremity AV fistula has good thrill and bruit   Dialysis orders:  [facility] MWF    3h (new start)  132kg  300/600  RUE AVF  2/2 bath  Hep none   venofer 100 mg ordered each treatment x 4, Hb 8.5 on 9/21  no ESA order yet  PTH 1366 on 04/15/19  Assessment/ Plan #Cardiogenic shock/STEMI: Status post emergent CABG on 9/24.  On Impella.  As per heart failure team and CT surgery.  Patient is on epinephrine, Levophed, vasopressin.  # ESRD: Recently started on dialysis with outpatient MWF schedule.  Pt on multiple pressors w/ cardiogenic shock.  Getting CRRT D#2, 4K bath, UF 0- 50 cc/hr as tolerated.  Already on IV heparin.  Discussed with ICU nurse  # Anemia: Due to ABLA and CKD: Transfuse blood prn per ICU team.  # Secondary hyperparathyroidism: -PTH 1366 on 9/23 - outpatient calcium at 11.3 on 9/23  - Hold activated vit D for now acutely today and follow trends   # Acute on chronic systolic heart failure: Cont CRRT.  Heart failure team following.  Kelly Splinter, MD 04/18/2019, 12:52 PM    Subjective: Seen and examined in ICU.  Patient is on multiple pressor.  He was alert awake and following commands.  No new c/o.   Objective Vital signs in last 24 hours: Vitals:   04/18/19 0700 04/18/19 0800 04/18/19 0900 04/18/19 1000  BP: 100/69 92/61 107/62 95/82  Pulse: (!) 107 (!) 107    Resp: (!) 21 (!) 25 14 18   Temp: (!) 96.6 F (35.9 C) (!) 96.6 F (35.9 C)  (!) 96.6 F (35.9 C) (!) 96.4 F (35.8 C)  TempSrc:  Core    SpO2: 99% 99%    Weight:      Height:       Weight change: 7.478 kg  Intake/Output Summary (Last 24 hours) at 04/18/2019 1246 Last data filed at 04/18/2019 1204 Gross per 24 hour  Intake 4082.44 ml  Output 3728 ml  Net 354.44 ml       Labs: Basic Metabolic Panel: Recent Labs  Lab 04/17/19 0352  04/17/19 1544 04/18/19 0541 04/18/19 0559  NA 142   < > 139  137 138 138  K 4.2   < > 4.1  4.1 4.0 3.9  CL 110  --  107  105 104  --   CO2 22  --  21*  20* 22  --   GLUCOSE 165*  --  167*  168* 141*  --   BUN 54*  --  51*  51* 37*  --   CREATININE 6.62*  --  6.25*  6.17* 4.58*  --   CALCIUM 10.1  --  10.4*  10.3 11.1*  --   PHOS  --   --  7.1* 6.1*  --    < > = values in this interval not displayed.   Liver Function Tests: Recent Labs  Lab 04/17/19 1544 04/18/19 0541  ALBUMIN 2.6* 2.7*   No results for input(s): LIPASE, AMYLASE  in the last 168 hours. No results for input(s): AMMONIA in the last 168 hours. CBC: Recent Labs  Lab 04/16/19 2344 04/17/19 0352  04/17/19 1544 04/17/19 2328 04/18/19 0541 04/18/19 0559  WBC 22.2* 19.7*  --  24.2* 30.7* 32.6*  --   HGB 8.1* 7.6*   < > 7.5* 7.2* 8.4* 8.2*  HCT 23.8* 22.3*   < > 23.0* 22.2* 24.6* 24.0*  MCV 85.6 85.4  --  87.8 87.1 86.3  --   PLT 132* 111*  --  97* 114* 117*  --    < > = values in this interval not displayed.   Cardiac Enzymes: No results for input(s): CKTOTAL, CKMB, CKMBINDEX, TROPONINI in the last 168 hours. CBG: Recent Labs  Lab 04/17/19 1553 04/17/19 1953 04/18/19 0032 04/18/19 0402 04/18/19 0741  GLUCAP 166* 168* 159* 142* 118*    Iron Studies:  Recent Labs    04/16/19 2343  IRON 137  TIBC 188*  FERRITIN 310   Medications: Infusions: .  prismasol BGK 4/2.5 500 mL/hr at 04/18/19 0836  .  prismasol BGK 4/2.5 300 mL/hr at 04/18/19 0516  . sodium chloride 20 mL/hr at 04/18/19 1200  . sodium chloride    . sodium  chloride 10 mL/hr at 04/18/19 0858  . amiodarone 60 mg/hr (04/18/19 1200)  . dexmedetomidine (PRECEDEX) IV infusion Stopped (04/17/19 0316)  . dextrose 5 % Impella 5.0 Purge solution Stopped (04/16/19 2026)  . DOBUTamine 2.5 mcg/kg/min (04/18/19 1200)  . DOPamine    . epinephrine 3 mcg/min (04/18/19 1200)  . impella catheter heparin 50 unit/mL in dextrose 5%    . heparin 1,100 Units/hr (04/18/19 1223)  . lactated ringers    . lactated ringers    . lactated ringers 20 mL/hr at 04/18/19 1200  . norepinephrine (LEVOPHED) Adult infusion 18 mcg/min (04/18/19 1245)  . piperacillin-tazobactam (ZOSYN)  IV 2.25 g (04/18/19 1214)  . prismasol BGK 4/2.5 1,500 mL/hr at 04/18/19 0840  . vasopressin (PITRESSIN) infusion - *FOR SHOCK* 0.04 Units/min (04/18/19 1200)    Scheduled Medications: . sodium chloride   Intravenous Once  . acetaminophen  1,000 mg Oral Q6H   Or  . acetaminophen (TYLENOL) oral liquid 160 mg/5 mL  1,000 mg Per Tube Q6H  . acetaminophen (TYLENOL) oral liquid 160 mg/5 mL  650 mg Per Tube Once   Or  . acetaminophen  650 mg Rectal Once  . aspirin EC  325 mg Oral Daily   Or  . aspirin  324 mg Per Tube Daily  . B-complex with vitamin C  1 tablet Oral Daily  . bisacodyl  10 mg Oral Daily   Or  . bisacodyl  10 mg Rectal Daily  . Chlorhexidine Gluconate Cloth  6 each Topical Daily  . docusate sodium  200 mg Oral Daily  . febuxostat  40 mg Oral QODAY  . feeding supplement  1 Container Oral TID BM  . feeding supplement (PRO-STAT SUGAR FREE 64)  30 mL Oral TID BM  . fluticasone  2 spray Each Nare Daily  . insulin aspart  0-24 Units Subcutaneous Q4H  . mouth rinse  15 mL Mouth Rinse BID  . pantoprazole  40 mg Oral Daily  . rosuvastatin  5 mg Oral q1800  . sodium bicarbonate  1,300 mg Oral BID  . sodium chloride flush  10-40 mL Intracatheter Q12H  . sodium chloride flush  3 mL Intravenous Q12H    have reviewed scheduled and prn medications.

## 2019-04-19 DIAGNOSIS — Z951 Presence of aortocoronary bypass graft: Secondary | ICD-10-CM

## 2019-04-19 LAB — POCT I-STAT 7, (LYTES, BLD GAS, ICA,H+H)
Acid-Base Excess: 1 mmol/L (ref 0.0–2.0)
Bicarbonate: 26 mmol/L (ref 20.0–28.0)
Calcium, Ion: 1.63 mmol/L (ref 1.15–1.40)
HCT: 23 % — ABNORMAL LOW (ref 39.0–52.0)
Hemoglobin: 7.8 g/dL — ABNORMAL LOW (ref 13.0–17.0)
O2 Saturation: 98 %
Patient temperature: 35.7
Potassium: 4.1 mmol/L (ref 3.5–5.1)
Sodium: 135 mmol/L (ref 135–145)
TCO2: 27 mmol/L (ref 22–32)
pCO2 arterial: 39.9 mmHg (ref 32.0–48.0)
pH, Arterial: 7.416 (ref 7.350–7.450)
pO2, Arterial: 100 mmHg (ref 83.0–108.0)

## 2019-04-19 LAB — RENAL FUNCTION PANEL
Albumin: 2.4 g/dL — ABNORMAL LOW (ref 3.5–5.0)
Albumin: 2.4 g/dL — ABNORMAL LOW (ref 3.5–5.0)
Anion gap: 9 (ref 5–15)
Anion gap: 10 (ref 5–15)
BUN: 25 mg/dL — ABNORMAL HIGH (ref 8–23)
BUN: 24 mg/dL — ABNORMAL HIGH (ref 8–23)
CO2: 25 mmol/L (ref 22–32)
CO2: 24 mmol/L (ref 22–32)
Calcium: 11.1 mg/dL — ABNORMAL HIGH (ref 8.9–10.3)
Calcium: 11.2 mg/dL — ABNORMAL HIGH (ref 8.9–10.3)
Chloride: 102 mmol/L (ref 98–111)
Chloride: 101 mmol/L (ref 98–111)
Creatinine, Ser: 3.24 mg/dL — ABNORMAL HIGH (ref 0.61–1.24)
Creatinine, Ser: 3.05 mg/dL — ABNORMAL HIGH (ref 0.61–1.24)
GFR calc Af Amer: 21 mL/min — ABNORMAL LOW (ref >60)
GFR calc Af Amer: 23 mL/min — ABNORMAL LOW (ref >60)
GFR calc non Af Amer: 18 mL/min — ABNORMAL LOW (ref >60)
GFR calc non Af Amer: 20 mL/min — ABNORMAL LOW (ref >60)
Glucose, Bld: 125 mg/dL — ABNORMAL HIGH (ref 70–99)
Glucose, Bld: 127 mg/dL — ABNORMAL HIGH (ref 70–99)
Phosphorus: 4.8 mg/dL — ABNORMAL HIGH (ref 2.5–4.6)
Phosphorus: 4.6 mg/dL (ref 2.5–4.6)
Potassium: 4.1 mmol/L (ref 3.5–5.1)
Potassium: 4.3 mmol/L (ref 3.5–5.1)
Sodium: 136 mmol/L (ref 135–145)
Sodium: 135 mmol/L (ref 135–145)

## 2019-04-19 LAB — CBC WITH DIFFERENTIAL/PLATELET
Abs Immature Granulocytes: 0.29 10*3/uL — ABNORMAL HIGH (ref 0.00–0.07)
Abs Immature Granulocytes: 0.29 10*3/uL — ABNORMAL HIGH (ref 0.00–0.07)
Basophils Absolute: 0 10*3/uL (ref 0.0–0.1)
Basophils Absolute: 0 10*3/uL (ref 0.0–0.1)
Basophils Relative: 0 %
Basophils Relative: 0 %
Eosinophils Absolute: 0 10*3/uL (ref 0.0–0.5)
Eosinophils Absolute: 0.1 10*3/uL (ref 0.0–0.5)
Eosinophils Relative: 0 %
Eosinophils Relative: 0 %
HCT: 23.1 % — ABNORMAL LOW (ref 39.0–52.0)
HCT: 23.1 % — ABNORMAL LOW (ref 39.0–52.0)
Hemoglobin: 7.8 g/dL — ABNORMAL LOW (ref 13.0–17.0)
Hemoglobin: 7.7 g/dL — ABNORMAL LOW (ref 13.0–17.0)
Immature Granulocytes: 1 %
Immature Granulocytes: 1 %
Lymphocytes Relative: 7 %
Lymphocytes Relative: 8 %
Lymphs Abs: 1.5 10*3/uL (ref 0.7–4.0)
Lymphs Abs: 1.8 10*3/uL (ref 0.7–4.0)
MCH: 29.8 pg (ref 26.0–34.0)
MCH: 29.7 pg (ref 26.0–34.0)
MCHC: 33.8 g/dL (ref 30.0–36.0)
MCHC: 33.3 g/dL (ref 30.0–36.0)
MCV: 88.2 fL (ref 80.0–100.0)
MCV: 89.2 fL (ref 80.0–100.0)
Monocytes Absolute: 1.9 10*3/uL — ABNORMAL HIGH (ref 0.1–1.0)
Monocytes Absolute: 2.2 10*3/uL — ABNORMAL HIGH (ref 0.1–1.0)
Monocytes Relative: 10 %
Monocytes Relative: 10 %
Neutro Abs: 16.4 10*3/uL — ABNORMAL HIGH (ref 1.7–7.7)
Neutro Abs: 17.4 10*3/uL — ABNORMAL HIGH (ref 1.7–7.7)
Neutrophils Relative %: 82 %
Neutrophils Relative %: 81 %
Platelets: 76 10*3/uL — ABNORMAL LOW (ref 150–400)
Platelets: 84 10*3/uL — ABNORMAL LOW (ref 150–400)
RBC: 2.62 MIL/uL — ABNORMAL LOW (ref 4.22–5.81)
RBC: 2.59 MIL/uL — ABNORMAL LOW (ref 4.22–5.81)
RDW: 16.2 % — ABNORMAL HIGH (ref 11.5–15.5)
RDW: 16.7 % — ABNORMAL HIGH (ref 11.5–15.5)
WBC: 20.1 10*3/uL — ABNORMAL HIGH (ref 4.0–10.5)
WBC: 21.8 10*3/uL — ABNORMAL HIGH (ref 4.0–10.5)
nRBC: 0.3 % — ABNORMAL HIGH (ref 0.0–0.2)
nRBC: 0.8 % — ABNORMAL HIGH (ref 0.0–0.2)

## 2019-04-19 LAB — APTT: aPTT: 74 seconds — ABNORMAL HIGH (ref 24–36)

## 2019-04-19 LAB — CBC
HCT: 20.8 % — ABNORMAL LOW (ref 39.0–52.0)
Hemoglobin: 7 g/dL — ABNORMAL LOW (ref 13.0–17.0)
MCH: 29.5 pg (ref 26.0–34.0)
MCHC: 33.7 g/dL (ref 30.0–36.0)
MCV: 87.8 fL (ref 80.0–100.0)
Platelets: 77 10*3/uL — ABNORMAL LOW (ref 150–400)
RBC: 2.37 MIL/uL — ABNORMAL LOW (ref 4.22–5.81)
RDW: 16.2 % — ABNORMAL HIGH (ref 11.5–15.5)
WBC: 19.1 10*3/uL — ABNORMAL HIGH (ref 4.0–10.5)
nRBC: 0.5 % — ABNORMAL HIGH (ref 0.0–0.2)

## 2019-04-19 LAB — POCT ACTIVATED CLOTTING TIME
Activated Clotting Time: 120 seconds
Activated Clotting Time: 120 seconds
Activated Clotting Time: 125 seconds
Activated Clotting Time: 125 seconds
Activated Clotting Time: 131 seconds
Activated Clotting Time: 136 seconds
Activated Clotting Time: 136 seconds

## 2019-04-19 LAB — LACTATE DEHYDROGENASE: LDH: 692 U/L — ABNORMAL HIGH (ref 98–192)

## 2019-04-19 LAB — GLUCOSE, CAPILLARY
Glucose-Capillary: 115 mg/dL — ABNORMAL HIGH (ref 70–99)
Glucose-Capillary: 118 mg/dL — ABNORMAL HIGH (ref 70–99)
Glucose-Capillary: 118 mg/dL — ABNORMAL HIGH (ref 70–99)
Glucose-Capillary: 132 mg/dL — ABNORMAL HIGH (ref 70–99)
Glucose-Capillary: 137 mg/dL — ABNORMAL HIGH (ref 70–99)
Glucose-Capillary: 86 mg/dL (ref 70–99)

## 2019-04-19 LAB — MAGNESIUM: Magnesium: 2.2 mg/dL (ref 1.7–2.4)

## 2019-04-19 LAB — PREPARE RBC (CROSSMATCH)

## 2019-04-19 MED ORDER — SODIUM CHLORIDE 0.9 % IV SOLN
250.0000 [IU]/h | INTRAVENOUS | Status: DC
  Administered 2019-04-19: 250 [IU]/h via INTRAVENOUS_CENTRAL
  Filled 2019-04-19: qty 2

## 2019-04-19 MED ORDER — HEPARIN BOLUS VIA INFUSION (CRRT)
1000.0000 [IU] | INTRAVENOUS | Status: DC | PRN
  Filled 2019-04-19: qty 1000

## 2019-04-19 MED ORDER — HEPARIN (PORCINE) 25000 UT/250ML-% IV SOLN
2300.0000 [IU]/h | INTRAVENOUS | Status: DC
  Administered 2019-04-19 – 2019-04-20 (×2): 1100 [IU]/h via INTRAVENOUS
  Administered 2019-04-21: 1900 [IU]/h via INTRAVENOUS
  Administered 2019-04-21: 1650 [IU]/h via INTRAVENOUS
  Administered 2019-04-22: 2100 [IU]/h via INTRAVENOUS
  Administered 2019-04-22 – 2019-04-23 (×2): 2300 [IU]/h via INTRAVENOUS
  Filled 2019-04-19 (×5): qty 250

## 2019-04-19 MED ORDER — SODIUM CHLORIDE 0.9% IV SOLUTION
Freq: Once | INTRAVENOUS | Status: AC
  Administered 2019-04-19: 06:00:00 via INTRAVENOUS

## 2019-04-19 MED ORDER — FENTANYL CITRATE (PF) 100 MCG/2ML IJ SOLN
INTRAMUSCULAR | Status: AC
  Filled 2019-04-19: qty 2

## 2019-04-19 MED ORDER — FENTANYL CITRATE (PF) 100 MCG/2ML IJ SOLN
50.0000 ug | Freq: Once | INTRAMUSCULAR | Status: AC
  Administered 2019-04-19: 50 ug via INTRAVENOUS

## 2019-04-19 NOTE — Progress Notes (Addendum)
Advanced Heart Failure Rounding Note  PCP-Cardiologist: No primary care provider on file.   Subjective:    9/24 S/P CABG x3. Impella in place.   Awake. Conversant. Chest core. Denies SOB.   Remains on CVVHD keeping even. On NE 4, dobutamine 2.5 and VP 0.03. Epi off  Impella p-4 overnight flows 2.1 L  Waveforms ok. Personally reviewed  Remains in AF this am on amio at 60  Got 1u RBCs overnight.  CVP 12 PA 40/22  Thermo 7.8/3.2 SVR 550  Objective:   Weight Range: 134.7 kg Body mass index is 40.27 kg/m.   Vital Signs:   Temp:  [96.1 F (35.6 C)-96.8 F (36 C)] 96.4 F (35.8 C) (09/27 1218) Pulse Rate:  [73-102] 82 (09/27 1218) Resp:  [11-28] 22 (09/27 1218) BP: (82-105)/(38-75) 94/53 (09/27 1200) SpO2:  [93 %-100 %] 99 % (09/27 1218) Arterial Line BP: (78-133)/(43-82) 79/53 (09/27 1218) Weight:  [134.7 kg] 134.7 kg (09/27 0500) Last BM Date: (PTA)  Weight change: Filed Weights   04/17/19 0500 04/18/19 0600 04/19/19 0500  Weight: 134.5 kg (!) 136.3 kg 134.7 kg    Intake/Output:   Intake/Output Summary (Last 24 hours) at 04/19/2019 1219 Last data filed at 04/19/2019 1200 Gross per 24 hour  Intake 3562.94 ml  Output 3874.5 ml  Net -311.56 ml      Physical Exam    General:  Sitting up in bed. No resp difficulty HEENT: normal Neck: supple. RIJ trialysis. Carotids 2+ bilat; no bruits. No lymphadenopathy or thryomegaly appreciated. Cor: sternal dressing ok  PMI nondisplaced. Regular rate & rhythm. No rubs, gallops or murmurs. + CTs in place Lungs: clear Abdomen: obese soft, nontender, nondistended. No hepatosplenomegaly. No bruits or masses. Good bowel sounds. Extremities: no cyanosis, clubbing, rash, edema RFV swan  LFA impella sites ok  Neuro: alert & orientedx3, cranial nerves grossly intact. moves all 4 extremities w/o difficulty. Affect pleasant    Telemetry   AF 80s this am Personally reviewed  EKG    N/A  Labs    CBC Recent Labs   04/19/19 0404 04/19/19 0413 04/19/19 1130  WBC 19.1*  --  20.1*  NEUTROABS  --   --  16.4*  HGB 7.0* 7.8* 7.8*  HCT 20.8* 23.0* 23.1*  MCV 87.8  --  88.2  PLT 77*  --  76*   Basic Metabolic Panel Recent Labs    04/18/19 0541  04/18/19 1713 04/19/19 0403 04/19/19 0413  NA 138   < > 136 136 135  K 4.0   < > 4.0 4.1 4.1  CL 104  --  102 102  --   CO2 22  --  23 25  --   GLUCOSE 141*  --  157* 125*  --   BUN 37*  --  31* 25*  --   CREATININE 4.58*  --  3.77* 3.24*  --   CALCIUM 11.1*  --  11.0* 11.1*  --   MG 2.0  --   --  2.2  --   PHOS 6.1*  --  5.3* 4.8*  --    < > = values in this interval not displayed.   Liver Function Tests Recent Labs    04/18/19 1713 04/19/19 0403  ALBUMIN 2.6* 2.4*   No results for input(s): LIPASE, AMYLASE in the last 72 hours. Cardiac Enzymes No results for input(s): CKTOTAL, CKMB, CKMBINDEX, TROPONINI in the last 72 hours.  BNP: BNP (last 3 results) Recent Labs  08/25/18 1746 01/30/19 0819 02/05/19 0951  BNP 513.0* 1,138.2* 1,186.2*    ProBNP (last 3 results) No results for input(s): PROBNP in the last 8760 hours.   D-Dimer No results for input(s): DDIMER in the last 72 hours. Hemoglobin A1C Recent Labs    04/17/19 0819  HGBA1C 5.5   Fasting Lipid Panel No results for input(s): CHOL, HDL, LDLCALC, TRIG, CHOLHDL, LDLDIRECT in the last 72 hours. Thyroid Function Tests No results for input(s): TSH, T4TOTAL, T3FREE, THYROIDAB in the last 72 hours.  Invalid input(s): FREET3  Other results:   Imaging    No results found.   Medications:     Scheduled Medications: . sodium chloride   Intravenous Once  . acetaminophen  1,000 mg Oral Q6H   Or  . acetaminophen (TYLENOL) oral liquid 160 mg/5 mL  1,000 mg Per Tube Q6H  . acetaminophen (TYLENOL) oral liquid 160 mg/5 mL  650 mg Per Tube Once   Or  . acetaminophen  650 mg Rectal Once  . aspirin EC  325 mg Oral Daily   Or  . aspirin  324 mg Per Tube Daily  .  B-complex with vitamin C  1 tablet Oral Daily  . bisacodyl  10 mg Oral Daily   Or  . bisacodyl  10 mg Rectal Daily  . Chlorhexidine Gluconate Cloth  6 each Topical Daily  . docusate sodium  200 mg Oral Daily  . febuxostat  40 mg Oral QODAY  . feeding supplement  1 Container Oral TID BM  . feeding supplement (PRO-STAT SUGAR FREE 64)  30 mL Oral TID BM  . fluticasone  2 spray Each Nare Daily  . insulin aspart  0-24 Units Subcutaneous Q4H  . mouth rinse  15 mL Mouth Rinse BID  . pantoprazole  40 mg Oral Daily  . rosuvastatin  5 mg Oral q1800  . sodium bicarbonate  1,300 mg Oral BID  . sodium chloride flush  10-40 mL Intracatheter Q12H  . sodium chloride flush  3 mL Intravenous Q12H    Infusions: .  prismasol BGK 4/2.5 500 mL/hr at 04/19/19 0552  .  prismasol BGK 4/2.5 300 mL/hr at 04/18/19 2232  . sodium chloride Stopped (04/19/19 1144)  . sodium chloride    . sodium chloride 10 mL/hr at 04/19/19 0517  . amiodarone 60 mg/hr (04/19/19 1200)  . dexmedetomidine (PRECEDEX) IV infusion Stopped (04/17/19 0316)  . dextrose 5 % Impella 5.0 Purge solution Stopped (04/16/19 2026)  . DOBUTamine 2.5 mcg/kg/min (04/19/19 1200)  . DOPamine    . epinephrine Stopped (04/19/19 0019)  . impella catheter heparin 50 unit/mL in dextrose 5%    . lactated ringers    . lactated ringers    . lactated ringers 20 mL/hr at 04/19/19 1200  . norepinephrine (LEVOPHED) Adult infusion 4 mcg/min (04/19/19 1200)  . piperacillin-tazobactam (ZOSYN)  IV 100 mL/hr at 04/19/19 1200  . prismasol BGK 4/2.5 1,500 mL/hr at 04/19/19 1206  . vasopressin (PITRESSIN) infusion - *FOR SHOCK* 0.04 Units/min (04/19/19 1200)    PRN Medications: sodium chloride, lactated ringers, menthol-cetylpyridinium, metoprolol tartrate, midazolam, morphine injection, ondansetron (ZOFRAN) IV, oxyCODONE, sodium chloride flush, sodium chloride flush, traMADol   Assessment/Plan   1. Shock: Suspect cardiogenic shock in setting of atypical  atrial flutter with RVR and ischemia from severe left main stenosis. May have developed a bit of sepsis component on 9/25 -  Echo 9/25 EF 30% -  Now on NE 4, dobutamine 2.5, epi off, VP 0.03. Impella P-4 -  Hemodynamics look good. No co-ox but thermo numbers very good. - Impella parameters stable. Will decrease to P-2 and watch for 1 hour then pull. Heparin off - D/w Dr/ Kipp Brood personally  2. CAD: Concern for acute anterior MI and patient has occluded mid LAD.  However, suspect this was CTO.  Dr. Angelena Form did angioplasty but unable to restore flow.  Patient has a 90% distal left main stenosis compromising flow in LCx and large diagonal.  Suspect this led to ischemia/chest pain when the patient went into rapid atrial flutter PTA  - Patient taken urgently for CABG supported by Impella. S/P CABG x3 on 9/24 - Statin and ASA post-op, will eventually need anticoagulation with atrial flutter. Will resume 8 hours after Impella removal. Eventual switch to Eliquis once CTs out.  - Hold b-blocker for now while on pressors/Impella  3. Acute on chronic systolic CHF: Echo with EF in 25% range with peri-apical severe hypokinesis.  Suspect ischemic cardiomyopathy.  Elevated filling pressures on RHC.   - Echo EF 30% - On CRRT today. Volume status elevated. Will increase pull rate to 100-150/hr  4. Paroxysmal Atrial flutter/fib: This appears atypical, by symptoms think it started last night.  He tolerates poorly, likely triggered ischemia in territory supplied by left main (90% left main stenosis, likely chronic).  - Continue amiodarone gtt, he has been in and out of atrial flutter. Will resume 8 hours after Impella removal. Eventual switch to Eliquis once CTs out.   5. ESRD: Recent start on HD.  Nephrology following. On CVVD. Pull as above  6. ID - fevers resolved. WBC 32k-> 20k - abx broadened - cx NGTD  7. Anemia: Hgb 7.8 with expected blood loss. Received 1u overnight  8. Thrombocytopenia - Plts 76K  (down 110-120k). May be Impella related. No bleeding. - will follow - send HIT  CRITICAL CARE Performed by: Glori Bickers  Total critical care time: 35 minutes  Critical care time was exclusive of separately billable procedures and treating other patients.  Critical care was necessary to treat or prevent imminent or life-threatening deterioration.  Critical care was time spent personally by me (independent of midlevel providers or residents) on the following activities: development of treatment plan with patient and/or surrogate as well as nursing, discussions with consultants, evaluation of patient's response to treatment, examination of patient, obtaining history from patient or surrogate, ordering and performing treatments and interventions, ordering and review of laboratory studies, ordering and review of radiographic studies, pulse oximetry and re-evaluation of patient's condition.    Length of Stay: Beasley, MD  04/19/2019, 12:19 PM  Advanced Heart Failure Team Pager [phone] (M-F; 7a - 4p)  Please contact [facility] for night-coverage after hours (4p -7a ) and weekends on [email or web address]

## 2019-04-19 NOTE — Progress Notes (Signed)
ANTICOAGULATION CONSULT NOTE - Follow Up Consult  Pharmacy Consult for Heparin  Indication: atrial fibrillation  Allergies  Allergen Reactions  . Codeine Phosphate Other (See Comments)    Hyperactive     Patient Measurements: Height: 6' (182.9 cm) Weight: 296 lb 15.4 oz (134.7 kg) IBW/kg (Calculated) : 77.6  Vital Signs: Temp: 96.6 F (35.9 C) (09/27 1300) Temp Source: Core (09/27 0800) BP: 105/59 (09/27 1300) Pulse Rate: 86 (09/27 1300)  Labs: Recent Labs    04/16/19 1744  04/17/19 1149  04/18/19 0541  04/18/19 1713 04/19/19 0403 04/19/19 0404 04/19/19 0413 04/19/19 1130  HGB 8.5*   < > 7.3*   < > 8.4*   < >  --   --  7.0* 7.8* 7.8*  HCT 25.5*   < > 21.3*   < > 24.6*   < >  --   --  20.8* 23.0* 23.1*  PLT 126*   < >  --    < > 117*  --   --   --  77*  --  76*  APTT 37*  --  36  --  45*  --   --  74*  --   --   --   LABPROT 18.1*  --   --   --   --   --   --   --   --   --   --   INR 1.5*  --   --   --   --   --   --   --   --   --   --   CREATININE  --    < >  --    < > 4.58*  --  3.77* 3.24*  --   --   --    < > = values in this interval not displayed.    Estimated Creatinine Clearance: 30.6 mL/min (A) (by C-G formula based on SCr of 3.24 mg/dL (H)).   Medical History: Past Medical History:  Diagnosis Date  . Cervical disc disease   . Chronic kidney disease   . COLONIC POLYPS, HX OF 06/27/2007  . EXOGENOUS OBESITY 01/30/2010  . GERD (gastroesophageal reflux disease)    PMH  . GOUT 01/30/2010  . HYPERCHOLESTEROLEMIA 06/30/2007  . HYPERTENSION 06/27/2007  . LOW BACK PAIN 06/27/2007  . NEPHROLITHIASIS, HX OF 06/27/2007  . OSTEOARTHRITIS 06/27/2007  . SLEEP APNEA, OBSTRUCTIVE, MODERATE 01/27/2009  . Wears dentures    upper    Assessment: 68yom admitted for STEMI with LM disease > Impella CP placed in cath lab and then OR for CABG. Impella pulled today at 1030  Heparin to resume for afib/flutter 8 hours after Impella pull.  Goal of Therapy:  Heparin level  0.3-0.5 with recent Impella pull Monitor platelets by anticoagulation protocol: Yes   Plan:  Resume heparin IV at 1100 units/hr at 1830 Check next heparin level with AM labs Daily heparin level, CBC Monitor s/s bleeding  Vertis Kelch, PharmD PGY2 Cardiology Pharmacy Resident Phone [phone] 04/19/2019       1:26 PM  Please check [email or web address] for unit-specific pharmacist phone numbers

## 2019-04-19 NOTE — Progress Notes (Signed)
[facility] Progress Note  Subjective: Impella removed this am.  Patient w/o new c/o.  Net I/O was -261 cc y esterday.  No sig UOP.   Vitals:   04/19/19 1218 04/19/19 1300 04/19/19 1400 04/19/19 1500  BP:  (!) 105/59 (!) 107/57 (!) 98/59  Pulse: 82 86 78 88  Resp: (!) 22 (!) 21 15 16   Temp: (!) 96.4 F (35.8 C) (!) 96.6 F (35.9 C) 97.7 F (36.5 C) (!) 97.2 F (36.2 C)  TempSrc:      SpO2: 99% 97% 100% 99%  Weight:      Height:        Inpatient medications: . sodium chloride   Intravenous Once  . acetaminophen  1,000 mg Oral Q6H   Or  . acetaminophen (TYLENOL) oral liquid 160 mg/5 mL  1,000 mg Per Tube Q6H  . acetaminophen (TYLENOL) oral liquid 160 mg/5 mL  650 mg Per Tube Once   Or  . acetaminophen  650 mg Rectal Once  . aspirin EC  325 mg Oral Daily   Or  . aspirin  324 mg Per Tube Daily  . B-complex with vitamin C  1 tablet Oral Daily  . bisacodyl  10 mg Oral Daily   Or  . bisacodyl  10 mg Rectal Daily  . Chlorhexidine Gluconate Cloth  6 each Topical Daily  . docusate sodium  200 mg Oral Daily  . febuxostat  40 mg Oral QODAY  . feeding supplement  1 Container Oral TID BM  . feeding supplement (PRO-STAT SUGAR FREE 64)  30 mL Oral TID BM  . fluticasone  2 spray Each Nare Daily  . insulin aspart  0-24 Units Subcutaneous Q4H  . mouth rinse  15 mL Mouth Rinse BID  . pantoprazole  40 mg Oral Daily  . rosuvastatin  5 mg Oral q1800  . sodium bicarbonate  1,300 mg Oral BID  . sodium chloride flush  10-40 mL Intracatheter Q12H  . sodium chloride flush  3 mL Intravenous Q12H   .  prismasol BGK 4/2.5 500 mL/hr at 04/19/19 1405  .  prismasol BGK 4/2.5 300 mL/hr at 04/19/19 1405  . sodium chloride Stopped (04/19/19 1401)  . sodium chloride    . sodium chloride 10 mL/hr at 04/19/19 0517  . amiodarone 60 mg/hr (04/19/19 1500)  . dexmedetomidine (PRECEDEX) IV infusion Stopped (04/17/19 0316)  . dextrose 5 % Impella 5.0 Purge solution Stopped (04/16/19 2026)   . DOBUTamine 2.5 mcg/kg/min (04/19/19 1500)  . DOPamine    . epinephrine Stopped (04/19/19 0019)  . heparin 10,000 units/ 20 mL infusion syringe 250 Units/hr (04/19/19 1457)  . heparin    . lactated ringers    . lactated ringers    . lactated ringers 20 mL/hr at 04/19/19 1500  . norepinephrine (LEVOPHED) Adult infusion 12 mcg/min (04/19/19 1500)  . piperacillin-tazobactam (ZOSYN)  IV Stopped (04/19/19 1214)  . prismasol BGK 4/2.5 1,500 mL/hr at 04/19/19 1405  . vasopressin (PITRESSIN) infusion - *FOR SHOCK* 0.04 Units/min (04/19/19 1500)   sodium chloride, heparin, lactated ringers, menthol-cetylpyridinium, metoprolol tartrate, midazolam, morphine injection, ondansetron (ZOFRAN) IV, oxyCODONE, sodium chloride flush, sodium chloride flush, traMADol    Exam: General:NAD, comfortable Heart:RRR, s1s2 nl Lungs: Surgical wound with dressing in chest, lungs clear with decreased breath sound basally, no wheezing Abdomen:soft, Non-tender, non-distended Extremities: 1+ lower extremity edema  Dialysis Access: RUE AVF + bruit    Dialysis:  [facility] MWF  - new start, had OP HD on 9/21  and 9/23 prior to admission  3h  132kg  300/600  RUE AVF  2/2 bath  Hep none   venofer 100 mg ordered each treatment x 4, Hb 8.5 on 9/21  no ESA order yet  PTH 1366 on 04/15/19   Assessment/ Plan #Cardiogenic shock/STEMI: Status post emergent CABG on 9/24.  SP Impella, removed today. As per heart failure team and CT surgery.  Patient is on dobutamine, levo and vaso gtt's this am.   # ESRD: new start had 1st / 2nd HD last week prior to admission. In cardiogenic shock and started CRRT here on 9/25. D#3. Cont 4K bath, UF 0- 100 cc/hr as tolerated.  Systemic heparin was dc'd this am and filter clotted this afternoon, will start heparin IV thru CRRT circuit w/ ACT checks. Watch plts closely, may have to dc if they drop much further. Will transition to regular HD when off pressors and clinically stable.  Discussed with  ICU nurse  # Anemia: Due to ABLA and CKD: Transfuse blood prn per ICU team.  # Secondary hyperparathyroidism: -PTH 1366 on 9/23 - outpatient calcium at 11.3 on 9/23  - Hold activated vit D for now acutely today and follow trends  # Acute on chronic systolic heart failure: Cont CRRT.  Heart failure team following.      Rob Doctor, hospital 04/19/2019, 3:21 PM  Iron/TIBC/Ferritin/ %Sat    Component Value Date/Time   IRON 137 04/16/2019 2343   IRON 32 (L) 01/26/2019 1100   TIBC 188 (L) 04/16/2019 2343   TIBC 226 (L) 01/26/2019 1100   FERRITIN 310 04/16/2019 2343   FERRITIN 301 01/26/2019 1100   IRONPCTSAT 73 (H) 04/16/2019 2343   IRONPCTSAT 14 (L) 01/26/2019 1100   Recent Labs  Lab 04/16/19 1744  04/19/19 0403 04/19/19 0413  NA  --    < > 136 135  K  --    < > 4.1 4.1  CL  --    < > 102  --   CO2  --    < > 25  --   GLUCOSE  --    < > 125*  --   BUN  --    < > 25*  --   CREATININE  --    < > 3.24*  --   CALCIUM  --    < > 11.1*  --   PHOS  --    < > 4.8*  --   ALBUMIN  --    < > 2.4*  --   INR 1.5*  --   --   --    < > = values in this interval not displayed.   No results for input(s): AST, ALT, ALKPHOS, BILITOT, PROT in the last 168 hours. Recent Labs  Lab 04/19/19 1130  WBC 20.1*  HGB 7.8*  HCT 23.1*  PLT 76*

## 2019-04-19 NOTE — Progress Notes (Signed)
[address]       [place] [address]             [phone]                 3 Days Post-Op Procedure(s) (LRB): CORONARY ARTERY BYPASS GRAFTING (CABG)X3  , WITH ENDOSCOPIC HARVESTING OF RIGHT GREATER SAPHENOUS VEIN (N/A) TRANSESOPHAGEAL ECHOCARDIOGRAM (TEE) (N/A)   Events: No events overnight.   1 unit of blood transfused _______________________________________________________________ Vitals: BP 94/68   Pulse 73   Temp (!) 96.4 F (35.8 C) (Core)   Resp 19   Ht 6' (1.829 m)   Wt 134.7 kg   SpO2 100%   BMI 40.27 kg/m   - Neuro: awake, follows commands  - Cardiovascular: sinus  Drips:  Levo 2, Dobut 2.5, Vaso 0.04  Impella P4 2.3L PAP: (36-58)/(18-28) 53/22 CVP:  [9 mmHg-17 mmHg] 15 mmHg PCWP:  [3 mmHg-15 mmHg] 15 mmHg CO:  [6.4 L/min-8.1 L/min] 7.8 L/min CI:  [2.6 L/min/m2-3.3 L/min/m2] 3.2 L/min/m2  - Pulm: EWOB  ABG    Component Value Date/Time   PHART 7.416 04/19/2019 0413   PCO2ART 39.9 04/19/2019 0413   PO2ART 100.0 04/19/2019 0413   HCO3 26.0 04/19/2019 0413   TCO2 27 04/19/2019 0413   ACIDBASEDEF 4.0 (H) 04/18/2019 0559   O2SAT 98.0 04/19/2019 0413    - Abd: soft - Ext: cool peripherally.  +dopplers in L PT   .Intake/Output      09/26 0701 - 09/27 0700 09/27 0701 - 09/28 0700   P.O. 30    I.V. (mL/kg) 2944.1 (21.9) 95.1 (0.7)   Blood  365.3   Other 272.9 11.4   IV Piggyback 200    Total Intake(mL/kg) 3447 (25.6) 471.8 (3.5)   Urine (mL/kg/hr) 20 (0) 5 (0)   Emesis/NG output 50    Drains 12.5 0   Other 3506 147   Stool 0    Chest Tube 120 0   Total Output 3708.5 152   Net -261.5 +319.8           _______________________________________________________________ Labs: CBC Latest Ref Rng & Units 04/19/2019 04/19/2019 04/18/2019  WBC 4.0 - 10.5 K/uL - 19.1(H) -  Hemoglobin 13.0 - 17.0 g/dL 7.8(L) 7.0(L) 8.2(L)  Hematocrit 39.0 - 52.0 % 23.0(L) 20.8(L) 24.0(L)  Platelets 150 - 400 K/uL - 77(L) -   CMP Latest Ref  Rng & Units 04/19/2019 04/19/2019 04/18/2019  Glucose 70 - 99 mg/dL - 125(H) 157(H)  BUN 8 - 23 mg/dL - 25(H) 31(H)  Creatinine 0.61 - 1.24 mg/dL - 3.24(H) 3.77(H)  Sodium 135 - 145 mmol/L 135 136 136  Potassium 3.5 - 5.1 mmol/L 4.1 4.1 4.0  Chloride 98 - 111 mmol/L - 102 102  CO2 22 - 32 mmol/L - 25 23  Calcium 8.9 - 10.3 mg/dL - 11.1(H) 11.0(H)  Total Protein 6.5 - 8.1 g/dL - - -  Total Bilirubin 0.3 - 1.2 mg/dL - - -  Alkaline Phos 38 - 126 U/L - - -  AST 15 - 41 U/L - - -  ALT 0 - 44 U/L - - -    CXR: Pulmonary vascular congestion stable  _______________________________________________________________  Assessment and Plan: POD 3 s/p CABG 3, impella placement in Cath lab following STEMI  Neuro: pain controlled CV: on low dose inotropic and vasopressor support.  CO of 7.  Will remove Impella today. Pulm: pulm toilet.   Renal: CRRT but running patient even. GI: continue  NPO for now.   Heme: received 1 unit of blood.  Heparin on hold for Impella removal ID: afebrile trending down Endo: on insulin gtt Dispo: continue ICU care.  Melodie Bouillon, MD 04/19/2019 8:57 AM

## 2019-04-19 NOTE — Progress Notes (Signed)
Hgb 7.0 on morning labs. Paged Dr. Kipp Brood, orders received for 1u pRBC and turn heparin off at 0600. Hematoma still present at impella insertion site, no obvious growth, still tender to the touch. New bruising located on lower panus. Soft to the touch, not tender.

## 2019-04-19 NOTE — Progress Notes (Signed)
   Impella weaned down to P-2. Patient stable for 1 hour.   Heparin held.  ACT < 120.   Sutures removed. Impella turned down to P-1 and pulled back across the AoV. Impella then truned off and device pulled down to the sheath. Entire sheath and device removed from LFA.   Manual pressure held personally for 45 minutes with good hemostasis.   Fem-stop placed.   Glori Bickers, MD  12:30 PM

## 2019-04-20 ENCOUNTER — Encounter (HOSPITAL_COMMUNITY): Payer: Self-pay | Admitting: Cardiovascular Disease

## 2019-04-20 ENCOUNTER — Inpatient Hospital Stay (HOSPITAL_COMMUNITY): Payer: Medicare Other

## 2019-04-20 LAB — POCT I-STAT 7, (LYTES, BLD GAS, ICA,H+H)
Acid-Base Excess: 2 mmol/L (ref 0.0–2.0)
Bicarbonate: 27.4 mmol/L (ref 20.0–28.0)
Calcium, Ion: 1.65 mmol/L (ref 1.15–1.40)
HCT: 22 % — ABNORMAL LOW (ref 39.0–52.0)
Hemoglobin: 7.5 g/dL — ABNORMAL LOW (ref 13.0–17.0)
O2 Saturation: 97 %
Patient temperature: 98.6
Potassium: 4.1 mmol/L (ref 3.5–5.1)
Sodium: 135 mmol/L (ref 135–145)
TCO2: 29 mmol/L (ref 22–32)
pCO2 arterial: 46.8 mmHg (ref 32.0–48.0)
pH, Arterial: 7.376 (ref 7.350–7.450)
pO2, Arterial: 96 mmHg (ref 83.0–108.0)

## 2019-04-20 LAB — RENAL FUNCTION PANEL
Albumin: 2.3 g/dL — ABNORMAL LOW (ref 3.5–5.0)
Albumin: 2.3 g/dL — ABNORMAL LOW (ref 3.5–5.0)
Anion gap: 6 (ref 5–15)
Anion gap: 6 (ref 5–15)
BUN: 20 mg/dL (ref 8–23)
BUN: 20 mg/dL (ref 8–23)
CO2: 28 mmol/L (ref 22–32)
CO2: 29 mmol/L (ref 22–32)
Calcium: 11.2 mg/dL — ABNORMAL HIGH (ref 8.9–10.3)
Calcium: 11.3 mg/dL — ABNORMAL HIGH (ref 8.9–10.3)
Chloride: 102 mmol/L (ref 98–111)
Chloride: 102 mmol/L (ref 98–111)
Creatinine, Ser: 2.7 mg/dL — ABNORMAL HIGH (ref 0.61–1.24)
Creatinine, Ser: 2.84 mg/dL — ABNORMAL HIGH (ref 0.61–1.24)
GFR calc Af Amer: 27 mL/min — ABNORMAL LOW (ref >60)
GFR calc Af Amer: 25 mL/min — ABNORMAL LOW (ref >60)
GFR calc non Af Amer: 23 mL/min — ABNORMAL LOW (ref >60)
GFR calc non Af Amer: 22 mL/min — ABNORMAL LOW (ref >60)
Glucose, Bld: 119 mg/dL — ABNORMAL HIGH (ref 70–99)
Glucose, Bld: 97 mg/dL (ref 70–99)
Phosphorus: 4 mg/dL (ref 2.5–4.6)
Phosphorus: 4.4 mg/dL (ref 2.5–4.6)
Potassium: 4.2 mmol/L (ref 3.5–5.1)
Potassium: 4.3 mmol/L (ref 3.5–5.1)
Sodium: 136 mmol/L (ref 135–145)
Sodium: 137 mmol/L (ref 135–145)

## 2019-04-20 LAB — TYPE AND SCREEN
ABO/RH(D): O NEG
Antibody Screen: NEGATIVE
Unit division: 0
Unit division: 0
Unit division: 0
Unit division: 0
Unit division: 0
Unit division: 0
Unit division: 0
Unit division: 0

## 2019-04-20 LAB — GLUCOSE, CAPILLARY
Glucose-Capillary: 106 mg/dL — ABNORMAL HIGH (ref 70–99)
Glucose-Capillary: 88 mg/dL (ref 70–99)
Glucose-Capillary: 92 mg/dL (ref 70–99)
Glucose-Capillary: 92 mg/dL (ref 70–99)
Glucose-Capillary: 96 mg/dL (ref 70–99)
Glucose-Capillary: 98 mg/dL (ref 70–99)
Glucose-Capillary: 99 mg/dL (ref 70–99)

## 2019-04-20 LAB — BPAM RBC
Blood Product Expiration Date: 202010092359
Blood Product Expiration Date: 202010122359
Blood Product Expiration Date: 202010122359
Blood Product Expiration Date: 202010162359
Blood Product Expiration Date: 202010162359
Blood Product Expiration Date: 202010172359
Blood Product Expiration Date: 202010172359
Blood Product Expiration Date: 202010232359
ISSUE DATE / TIME: 202009241026
ISSUE DATE / TIME: 202009241026
ISSUE DATE / TIME: 202009241026
ISSUE DATE / TIME: 202009241026
ISSUE DATE / TIME: 202009241445
ISSUE DATE / TIME: 202009250653
ISSUE DATE / TIME: 202009260029
ISSUE DATE / TIME: 202009270538
Unit Type and Rh: 5100
Unit Type and Rh: 5100
Unit Type and Rh: 9500
Unit Type and Rh: 9500
Unit Type and Rh: 9500
Unit Type and Rh: 9500
Unit Type and Rh: 9500
Unit Type and Rh: 9500

## 2019-04-20 LAB — CBC
HCT: 22.6 % — ABNORMAL LOW (ref 39.0–52.0)
Hemoglobin: 7.2 g/dL — ABNORMAL LOW (ref 13.0–17.0)
MCH: 29 pg (ref 26.0–34.0)
MCHC: 31.9 g/dL (ref 30.0–36.0)
MCV: 91.1 fL (ref 80.0–100.0)
Platelets: 89 10*3/uL — ABNORMAL LOW (ref 150–400)
RBC: 2.48 MIL/uL — ABNORMAL LOW (ref 4.22–5.81)
RDW: 17.6 % — ABNORMAL HIGH (ref 11.5–15.5)
WBC: 19.7 10*3/uL — ABNORMAL HIGH (ref 4.0–10.5)
nRBC: 0.8 % — ABNORMAL HIGH (ref 0.0–0.2)

## 2019-04-20 LAB — HEPARIN LEVEL (UNFRACTIONATED)
Heparin Unfractionated: <0.1 IU/mL — ABNORMAL LOW (ref 0.30–0.70)
Heparin Unfractionated: <0.1 IU/mL — ABNORMAL LOW (ref 0.30–0.70)

## 2019-04-20 LAB — PREPARE RBC (CROSSMATCH)

## 2019-04-20 LAB — APTT: aPTT: 42 seconds — ABNORMAL HIGH (ref 24–36)

## 2019-04-20 LAB — HEPARIN INDUCED PLATELET AB (HIT ANTIBODY): Heparin Induced Plt Ab: 0.116 OD (ref 0.000–0.400)

## 2019-04-20 LAB — LACTATE DEHYDROGENASE: LDH: 504 U/L — ABNORMAL HIGH (ref 98–192)

## 2019-04-20 LAB — MAGNESIUM: Magnesium: 2.4 mg/dL (ref 1.7–2.4)

## 2019-04-20 MED ORDER — SODIUM CHLORIDE 0.9% IV SOLUTION
Freq: Once | INTRAVENOUS | Status: AC
  Administered 2019-04-20: 09:00:00 via INTRAVENOUS

## 2019-04-20 MED ORDER — SODIUM CHLORIDE 0.9 % IV SOLN
INTRAVENOUS | Status: DC | PRN
  Administered 2019-04-21 – 2019-04-29 (×2): via INTRA_ARTERIAL

## 2019-04-20 MED ORDER — PIPERACILLIN-TAZOBACTAM 3.375 G IVPB 30 MIN
3.3750 g | Freq: Four times a day (QID) | INTRAVENOUS | Status: DC
  Administered 2019-04-20 – 2019-04-24 (×16): 3.375 g via INTRAVENOUS
  Filled 2019-04-20 (×31): qty 50

## 2019-04-20 MED ORDER — PIPERACILLIN-TAZOBACTAM 3.375 G IVPB: 3.3750 g | Freq: Four times a day (QID) | INTRAVENOUS | Status: DC

## 2019-04-20 MED FILL — Calcium Chloride Inj 10%: INTRAVENOUS | Qty: 10 | Status: AC

## 2019-04-20 MED FILL — Sodium Chloride IV Soln 0.9%: INTRAVENOUS | Qty: 2000 | Status: AC

## 2019-04-20 MED FILL — Potassium Chloride Inj 2 mEq/ML: INTRAVENOUS | Qty: 40 | Status: AC

## 2019-04-20 MED FILL — Sodium Bicarbonate IV Soln 8.4%: INTRAVENOUS | Qty: 150 | Status: AC

## 2019-04-20 MED FILL — Heparin Sodium (Porcine) Inj 1000 Unit/ML: INTRAMUSCULAR | Qty: 20 | Status: AC

## 2019-04-20 MED FILL — Heparin Sodium (Porcine) Inj 1000 Unit/ML: INTRAMUSCULAR | Qty: 30 | Status: AC

## 2019-04-20 MED FILL — Electrolyte-R (PH 7.4) Solution: INTRAVENOUS | Qty: 3000 | Status: AC

## 2019-04-20 MED FILL — Lidocaine HCl Local Preservative Free (PF) Inj 2%: INTRAMUSCULAR | Qty: 15 | Status: AC

## 2019-04-20 MED FILL — Sodium Chloride IV Soln 0.9%: INTRAVENOUS | Qty: 8000 | Status: AC

## 2019-04-20 NOTE — Progress Notes (Signed)
[facility] Evening Rounds  POD # 4 s/p emergent CABG Tolerating HD Some lines removed Doing well off ventilator.   Continue present management  Alex Williams Z. Orvan Seen, [place]

## 2019-04-20 NOTE — Progress Notes (Addendum)
Nutrition Follow-up  DOCUMENTATION CODES:   Morbid obesity  INTERVENTION:   If diet unable to advance and PO intake remains minimal, recommend Cortrak placement. Service offered Tuesday from 8am-4pm.    Continue Boost Breeze po TID, each supplement provides 250 kcal and 9 grams of protein  Continue Ensure Enlive po BID, each supplement provides 350 kcal and 20 grams of protein  Continue b complex with Vitamin C  NUTRITION DIAGNOSIS:   Increased nutrient needs related to chronic illness as evidenced by estimated needs.  Ongoing   GOAL:   Patient will meet greater than or equal to 90% of their needs  Not meeting at this time   MONITOR:   PO intake, Supplement acceptance, Diet advancement, Labs, Weight trends, I & O's, Skin  REASON FOR ASSESSMENT:   Rounds    ASSESSMENT:   68 year old male who presented to the ED on 9/24 with chest pain. PMH of HTN, ESRD recently started on HD, GERD, gout, HLD. Code STEMI called by ED. Pt found to have CHF.   9/24 - s/p LHC and RHC, Impella placement, urgent CABG x 3 9/25 - CRRT initiated, diet advanced to clear liquids  Pt discussed during ICU rounds and with RN.   Impella removed yesterday. Plan for swan removal today. On CRRT pulling 100 ml/hr. Requiring pressors. Remains on clear liquid diet. Per RN, pt not eating. Contacted MD to advance diet so we can try higher kcal/protein supplements. May need to consider Cortrak for temporary means of nutrition until PO intake progresses.   No BM since admit. Bowel regimen in place.   Admission weight: 128.8 kg Current weight: 132 kg    I/O:+3,5560 ml since admit UOP: 32 ml x 24 hrs  JP drain: 3 ml x 24 hrs Chest tube: 80 ml x 24 hrs  CRRT: 4,531 ml x 24 hrs   Drips: amiodarone, dobutrex, levophed Medications: b complex with Vit, dulcolax, colace, SS novolog Labs: corrected calcium 12.6 (H) CBG 92-106 Cr 2.7- decreased   Diet Order:   Diet Order            Diet clear liquid  Room service appropriate? Yes; Fluid consistency: Thin  Diet effective now              EDUCATION NEEDS:   Education needs have been addressed  Skin:  Skin Assessment: Skin Integrity Issues: Skin Integrity Issues:: Incisions Incisions: left chest, right leg, right arm  Last BM:  PTA  Height:   Ht Readings from Last 1 Encounters:  04/20/19 6' (1.829 m)    Weight:   Wt Readings from Last 1 Encounters:  04/20/19 132 kg    Ideal Body Weight:  80.9 kg  BMI:  Body mass index is 39.47 kg/m.  Estimated Nutritional Needs:   Kcal:  2400-2600  Protein:  140-160 grams  Fluid:  1000 ml + UOP   Mariana Single RD, LDN Clinical Nutrition Pager # [phone]

## 2019-04-20 NOTE — Procedures (Signed)
Central Venous Catheter Insertion Procedure Note Alex Williams [id number] November 03, 1950  Procedure: Insertion of Central Venous Catheter.  Left subclavian Indications: Drug and/or fluid administration  Procedure Details Consent: Risks of procedure as well as the alternatives and risks of each were explained to the (patient/caregiver).  Consent for procedure obtained. Time Out: Verified patient identification, verified procedure, site/side was marked, verified correct patient position, special equipment/implants available, medications/allergies/relevent history reviewed, required imaging and test results available.  Performed  Maximum sterile technique was used including antiseptics, cap, gloves, gown, hand hygiene and mask. Skin prep: Chlorhexidine; local anesthetic administered A antimicrobial bonded/coated triple lumen catheter was placed in the left subclavian vein using the Seldinger technique.  Evaluation Blood flow good Complications: No apparent complications Patient did tolerate procedure well. Chest X-ray ordered to verify placement.  CXR: pending.  Alex Williams Alex Williams 04/20/2019, 11:13 AM

## 2019-04-20 NOTE — Discharge Summary (Signed)
Physician Discharge Summary       [address]       [place] [address]             [phone]    Patient ID: Alex Williams MRN: [id number] DOB/AGE: 69/04/1951 69 y.o.  Admit date: 04/16/2019 Discharge date: 04/30/2019  Admission Diagnoses: 1. Cardiogenic shock ([facility]) 2. Acute ST elevation myocardial infarction (STEMI) involving left anterior descending (LAD) coronary artery ([employer]) 3. Coronary artery disease  Discharge Diagnoses:  1. S/P CABG x 3 2. Anemia 3. Thrombocytopenia 4. History of hypertension 5. History of OSA 6. History of hypercholesterolemia 7. History of exogenous obesity 8. History of CKD 9. History of cervical disc disease 10. History of GERD (gastroesophageal reflux disease) 11. History of low back pain 12. History of gout     Consults: nephrology and and heart failure  Procedure (s):  Emergency CABG X 3.  Warm and beating.  LIMA D2, RSVG OM2, PDA.  There was no good target on the LAD   Endoscopic greater saphenous vein harvest on the right Intra-operative Transesophageal Echocardiogram by Dr. Kipp Williams on 04/16/2019  History of Presenting Illness: Alex A Hanksis a 68 y.o.malewith history of ESRD on HD, GERD, gout, HTN, HLD who presented to the ED with c/o chest pain and palpitations. EKG with ST elevation in the precordial leads. Code STEMI called by the ED.  LHC noted LM disease, and acute occlusion of the LAD.  Was call emergently to the cath lab to assist with management. Currently placing Impella, and with PCI LAD lesion.  Patient was in a flutter on arrival to OR. He had  PCI to LAD performed, but it was asmall vessel.  Impella was placed as well. Potential risks, benefits, and complications of the surgery Patient was taken emergently to OR in order to undergo emergent CABGx 3.  Brief Hospital Course:  The patient was extubated early post operative day one without difficulty. He did have a fever and WBC was elevated. He was put  on Zosyn. Alex Williams and a line,  Heart failure managed the Impella. It was removed on 09/27. Alex Williams and a line were removed early in the post operative course. A new a line was put in 09/28 and sleeve was removed on this date. A central line was placed on 09/28 as well.  He remained on Dobutamine, Neo Synephrine and Amiodarone drips as of 09/28. Epi nephrine was discontinue. His creatinine worsened post op. Nephrology was consulted and CRRT was initiated. He had ABL anemia. He did require post op transfusions. Last H and H was 8.9/29.3. He was weaned off the insulin drip.  The patient's glucose remained well controlled.The patient's HGA1C pre op was 5.5. The patient was felt surgically stable for transfer from the ICU to [facility] for further convalescence on 10/05. He continues to progress with cardiac rehab. He was ambulating on room air. He has been tolerating a diet and has had a bowel movement. Chest tube sutures will be removed the day of discharge. The patient is felt surgically stable for discharge today.   Latest Vital Signs: Blood pressure 116/76, pulse 87, temperature 98.4 F (36.9 C), temperature source Oral, resp. rate 18, height 6' (1.829 m), weight 124.7 kg, SpO2 100 %.  Physical Exam:  General appearance: alert, cooperative and no distress Heart: regular rate and rhythm, S1, S2 normal, no murmur, click, rub or gallop Lungs: clear to auscultation bilaterally Abdomen: soft, non-tender; bowel sounds normal; no masses,  no organomegaly Extremities: 2-3+ pitting pedal edema Wound: clean and dry  Discharge Condition: Stable and discharged to CIR.  Recent laboratory studies:  Lab Results  Component Value Date   WBC 12.3 (H) 04/30/2019   HGB 7.3 (L) 04/30/2019   HCT 24.2 (L) 04/30/2019   MCV 93.8 04/30/2019   PLT 297 04/30/2019   Lab Results  Component Value Date   NA 138 04/30/2019   K 4.2 04/30/2019   CL 100 04/30/2019   CO2 26 04/30/2019   CREATININE 4.85 (H) 04/30/2019    GLUCOSE 84 04/30/2019      Diagnostic Studies: Dg Chest Port 1 View  Result Date: 04/29/2019 CLINICAL DATA:  Status post dialysis catheter insertion. EXAM: PORTABLE CHEST 1 VIEW COMPARISON:  04/28/2019 FINDINGS: 1342 hours. Low volume film. The cardio pericardial silhouette is enlarged. There is pulmonary vascular congestion without overt pulmonary edema. No pneumothorax or pleural effusion. Left subclavian central line tip overlies the left innominate vein near the innominate vein confluence. Right IJ central line tip overlies the upper right atrium. The visualized bony structures of the thorax are intact. Telemetry leads overlie the chest. IMPRESSION: 1. Low volume film with pulmonary vascular congestion. 2. Right IJ central line tip overlies the upper right atrium. Stable left subclavian line. Electronically Signed   By: Alex Williams M.D.   On: 04/29/2019 15:34   Dg Chest Port 1 View  Result Date: 04/28/2019 CLINICAL DATA:  Status post coronary artery bypass graft. EXAM: PORTABLE CHEST 1 VIEW COMPARISON:  Radiograph of April 23, 2019. FINDINGS: Stable cardiomegaly. Sternotomy wires are noted. Stable position of left subclavian catheter. Right internal jugular catheter is noted. No pneumothorax or pleural effusion is noted. Left lung is clear. Mild right basilar atelectasis is noted. Bony thorax is unremarkable. IMPRESSION: Mild right basilar subsegmental atelectasis. Bilateral internal jugular catheters are noted. Electronically Signed   By: Alex Williams M.D.   On: 04/28/2019 10:07   Dg Chest Port 1 View  Result Date: 04/23/2019 CLINICAL DATA:  Pneumothorax, recent CABG, chest pain. EXAM: PORTABLE CHEST 1 VIEW COMPARISON:  04/22/2019 FINDINGS: Heart size remains enlarged following median sternotomy and CABG. Cardiomediastinal contours are stable. Right IJ central venous catheter terminates in the mid superior vena cava. Left subclavian catheter terminates at the brachiocephalic junction. Lung  volumes remain low. No visible pneumothorax on this single portable view. Persistent basilar opacities and vascular congestion. No acute bone process. IMPRESSION: Right-sided line may have been exchanged since prior study and appears to been retracted slightly since previous exam though this could be due to projection. Otherwise stable evaluation. Electronically Signed   By: Zetta Bills M.D.   On: 04/23/2019 09:13   Dg Chest Port 1 View  Result Date: 04/22/2019 CLINICAL DATA:  Central line retracted. EXAM: PORTABLE CHEST 1 VIEW COMPARISON:  04/20/2019 FINDINGS: Stable enlarged cardiac silhouette and post CABG changes. The right jugular porta catheter tip is unchanged in the region of the superior cavoatrial junction. The left subclavian catheter is unchanged with its tip in the proximal superior vena cava. The previously demonstrated possible femoral Swan-Ganz catheter or overlying wire/tubing is no longer demonstrated. Mild bibasilar atelectasis with improvement. No acute bony abnormality. IMPRESSION: 1. No acute abnormality. 2. Mild bibasilar atelectasis with improvement. 3. Stable cardiomegaly. Electronically Signed   By: Claudie Revering M.D.   On: 04/22/2019 13:44   Dg Chest Port 1 View  Result Date: 04/20/2019 CLINICAL DATA:  Status post left subclavian central line placement today. EXAM: PORTABLE CHEST  1 VIEW COMPARISON:  Single-view of the chest 04/18/2019. FINDINGS: New left subclavian central venous catheter is in place with its tip projecting in the mid to upper superior vena cava. Impella device is no longer seen. Likely Swan-Ganz catheter from an inferior approach is looped in the right ventricle with its tip projecting retrograde. Mediastinal drain and left chest tube remain in place. There is no pneumothorax. Cardiomegaly and mild interstitial edema are seen. There is basilar atelectasis. The patient is status post CABG. IMPRESSION: Left subclavian catheter tip projects in the mid to upper  superior vena cava. No pneumothorax. Likely Swan-Ganz catheter from an inferior approach now appears looped with its tip projecting retrograde in the right ventricle. Interstitial pulmonary edema and bibasilar atelectasis. These results were called by telephone at the time of interpretation on 04/20/2019 at 11:15 am to the patient's nurse, Thayer Headings, who verbally acknowledged these results. Electronically Signed   By: Inge Rise M.D.   On: 04/20/2019 11:16   Dg Chest Port 1 View  Result Date: 04/18/2019 CLINICAL DATA:  Verify Impella placement. EXAM: PORTABLE CHEST 1 VIEW COMPARISON:  Radiograph yesterday at 6:40 p.m. FINDINGS: Tip of the right internal jugular catheter/sheath in the lower IVC. Impella device in place tip projecting over the ventricular apex, radiopaque marker in the ascending aorta. Probable Swan-Ganz catheter from an inferior approach tip in the region the main pulmonary outflow tract. Suspected mediastinal drains and left chest tube unchanged. Unchanged cardiomegaly and mediastinal contours. Suspect left pleural effusion without visualized pneumothorax. Slight improvement in pulmonary edema. Increased right infrahilar atelectasis. IMPRESSION: 1. Impella device in appropriate position. Additional support apparatus unchanged. 2. Slight improvement in pulmonary edema from yesterday. 3. Increased right infrahilar atelectasis. Suspect small left pleural effusion. Electronically Signed   By: Keith Rake M.D.   On: 04/18/2019 01:32   Dg Chest Port 1 View  Result Date: 04/17/2019 CLINICAL DATA:  Hemodialysis catheter placement EXAM: PORTABLE CHEST 1 VIEW COMPARISON:  Earlier same day FINDINGS: Dual lumen right internal jugular catheter tip at the SVC RA junction. Aortic device remains in place with tip in the ascending aorta. Inferiorly place 1 Ganz catheter has its tip in the right main pulmonary artery. Possible mediastinal drain. Left thoracostomy tube. Slightly less pulmonary edema and  better pulmonary aeration. No pneumothorax. IMPRESSION: Lines and tubes well positioned as above. Slightly less pulmonary edema. No pneumothorax. Electronically Signed   By: Nelson Chimes M.D.   On: 04/17/2019 18:55   Dg Chest Port 1 View  Result Date: 04/17/2019 CLINICAL DATA:  Catheter placement EXAM: PORTABLE CHEST 1 VIEW COMPARISON:  None. FINDINGS: There is been interval placement of a non tunneled right-sided dialysis catheter. The tip terminates near the cavoatrial junction. There is no right-sided pneumothorax. The patient is status post prior median sternotomy. The heart size remains enlarged. Again noted is an impella which appears grossly stable from prior study. There is a left-sided chest tube projecting over the left chest wall. Small bilateral pleural effusions and bibasilar atelectasis is again noted. IMPRESSION: 1. Interval placement of a right-sided dialysis catheter without evidence for a right-sided pneumothorax. 2. Otherwise, stable appearance of the chest. Electronically Signed   By: Constance Holster M.D.   On: 04/17/2019 11:06   Dg Chest Port 1 View  Result Date: 04/17/2019 CLINICAL DATA:  Chest tube EXAM: PORTABLE CHEST 1 VIEW COMPARISON:  Yesterday FINDINGS: Tracheal and esophageal extubation. Unchanged positioning of Impella device with tip over the cardiac apex. Chest tubes remain in place. Cardiomegaly  and vascular congestion with atelectasis and probable layering pleural fluid. No pneumothorax. IMPRESSION: 1. Unremarkable hardware positioning, including Impella. 2. Extubation with stable vascular congestion and atelectasis. Electronically Signed   By: Monte Fantasia M.D.   On: 04/17/2019 08:48   Dg Chest Port 1 View  Result Date: 04/16/2019 CLINICAL DATA:  Postop CABG EXAM: PORTABLE CHEST 1 VIEW COMPARISON:  04/16/2019 FINDINGS: Changes of CABG. Endotracheal tube is in place. The carina is difficult to visualize. The tip appears to be above the carina. Left chest tube in  place without pneumothorax. NG tube tip is in the distal esophagus. Cardiomegaly with vascular congestion. Bilateral effusions and bibasilar atelectasis. IMPRESSION: Postoperative changes from CABG. Left chest tube in place without pneumothorax. Carina is difficult to visualize but the endotracheal tube appears to be above the carina. Cardiomegaly, vascular congestion. Layering effusions with bibasilar atelectasis. No pneumothorax. Electronically Signed   By: Rolm Baptise M.D.   On: 04/16/2019 18:19   Dg Chest Port 1 View  Result Date: 04/16/2019 CLINICAL DATA:  Chest pain EXAM: PORTABLE CHEST 1 VIEW COMPARISON:  February 05, 2019 FINDINGS: The heart is enlarged with pulmonary vascularity within normal limits. There is no edema or consolidation. No adenopathy. There is aortic atherosclerosis. There is arthropathy in each shoulder. IMPRESSION: Persistent cardiac enlargement. No edema or consolidation. Pulmonary vascularity within normal limits. Aortic Atherosclerosis (ICD10-I70.0). Electronically Signed   By: Lowella Grip III M.D.   On: 04/16/2019 08:33   Dg Abd Portable 1v  Result Date: 04/16/2019 CLINICAL DATA:  OG tube placement EXAM: PORTABLE ABDOMEN - 1 VIEW COMPARISON:  None. FINDINGS: OG tube tip is seen in the distal stomach. IMPRESSION: OG tube tip in the distal stomach. Electronically Signed   By: Rolm Baptise M.D.   On: 04/16/2019 23:58   Dg Fluoro Guide Cv Line-no Report  Result Date: 04/29/2019 Fluoroscopy was utilized by the requesting physician.  No radiographic interpretation.   Vas US Duplex Dialysis Access (avf, Avg)  Result Date: 04/29/2019 DIALYSIS ACCESS Reason for Exam: Unable to dialyze through AVF/AVG. Access Site: Right Upper Extremity. Access Type: Brachial-cephalic AVF. Comparison Study: 03/02/19 Performing Technologist: June Leap RDMS, RVT  Examination Guidelines: A complete evaluation includes B-mode imaging, spectral Doppler, color Doppler, and power Doppler as needed  of all accessible portions of each vessel. Unilateral testing is considered an integral part of a complete examination. Limited examinations for reoccurring indications may be performed as noted.  Findings: +--------------------+----------+-----------------+--------+  AVF                  PSV (cm/s) Flow Vol (mL/min) Comments  +--------------------+----------+-----------------+--------+  Native artery inflow    300           2216                  +--------------------+----------+-----------------+--------+  +------------+----------+-------------+----------+--------+  OUTFLOW VEIN PSV (cm/s) Diameter (cm) Depth (cm) Describe  +------------+----------+-------------+----------+--------+  Shoulder        243         0.90         0.71              +------------+----------+-------------+----------+--------+  Prox UA                     0.82         0.92              +------------+----------+-------------+----------+--------+  Mid UA  210         0.77         0.27              +------------+----------+-------------+----------+--------+  Dist UA         608         0.77         0.52              +------------+----------+-------------+----------+--------+  AC Fossa        381         0.96         1.30              +------------+----------+-------------+----------+--------+   Summary: Patent Right brachiocephalic fistula. Appears unchanged when compared to previous study.  *See table(s) above for measurements and observations.  Diagnosing physician: Harold Barban MD Electronically signed by Harold Barban MD on 04/29/2019 at 7:02:39 AM.   --------------------------------------------------------------------------------   Final          Discharge Medications: Allergies as of 04/30/2019      Reactions   Codeine Phosphate Other (See Comments)   Hyperactive       Medication List    STOP taking these medications   amLODipine 10 MG tablet Commonly known as: NORVASC   Auryxia 1 GM 210 MG(Fe) tablet Generic  drug: ferric citrate   furosemide 80 MG tablet Commonly known as: LASIX   hydrocortisone cream 1 %   hydrOXYzine 25 MG tablet Commonly known as: ATARAX/VISTARIL   multivitamin with minerals Tabs tablet   sodium bicarbonate 650 MG tablet   terazosin 1 MG capsule Commonly known as: HYTRIN   traZODone 50 MG tablet Commonly known as: DESYREL     TAKE these medications   acetaminophen 500 MG tablet Commonly known as: TYLENOL Take 1,000 mg by mouth 2 (two) times a day.   amiodarone 200 MG tablet Commonly known as: PACERONE Take 1 tablet (200 mg total) by mouth 2 (two) times daily.   apixaban 5 MG Tabs tablet Commonly known as: ELIQUIS Take 1 tablet (5 mg total) by mouth 2 (two) times daily.   aspirin 81 MG EC tablet Take 1 tablet (81 mg total) by mouth daily. Start taking on: May 01, 2019   B-complex with vitamin C tablet Take 1 tablet by mouth daily. Start taking on: May 01, 2019   bisacodyl 5 MG EC tablet Commonly known as: DULCOLAX Take 2 tablets (10 mg total) by mouth daily. Start taking on: May 01, 2019   bisoprolol 5 MG tablet Commonly known as: ZEBETA Take 0.5 tablets (2.5 mg total) by mouth daily.   cinacalcet 30 MG tablet Commonly known as: SENSIPAR Take 1 tablet (30 mg total) by mouth daily with supper.   Darbepoetin Alfa 60 MCG/0.3ML Sosy injection Commonly known as: ARANESP Inject 0.3 mLs (60 mcg total) into the vein every Monday with hemodialysis. Start taking on: May 04, 2019   febuxostat 40 MG tablet Commonly known as: ULORIC Take 40 mg by mouth every Monday.   ferric gluconate 125 mg in sodium chloride 0.9 % 100 mL Inject 125 mg into the vein every Monday, Wednesday, and Friday with hemodialysis. Start taking on: May 01, 2019   fluticasone 50 MCG/ACT nasal spray Commonly known as: FLONASE Place 2 sprays into both nostrils daily. What changed:   when to take this  reasons to take this   heparin 1000 UNIT/ML  injection Inject 1 mL (1,000 Units total) into the vein  every dialysis (For HD intracatheter lock).   hydrocortisone 2.5 % rectal cream Commonly known as: ANUSOL-HC Place rectally as needed for hemorrhoids or anal itching.   lactated ringers infusion Inject 10 mLs into the vein continuous.   menthol-cetylpyridinium 3 MG lozenge Commonly known as: CEPACOL Take 1 lozenge (3 mg total) by mouth as needed for sore throat.   midodrine 5 MG tablet Commonly known as: PROAMATINE Take 1 tablet (5 mg total) by mouth every Monday, Wednesday, and Friday. Start taking on: May 01, 2019   ondansetron 4 MG/2ML Soln injection Commonly known as: ZOFRAN Inject 2 mLs (4 mg total) into the vein every 6 (six) hours as needed for nausea or vomiting.   pantoprazole 40 MG tablet Commonly known as: PROTONIX Take 1 tablet (40 mg total) by mouth daily. Start taking on: May 01, 2019   psyllium 95 % Pack Commonly known as: HYDROCIL/METAMUCIL Take 1 packet by mouth 2 (two) times daily.   rosuvastatin 10 MG tablet Commonly known as: CRESTOR Take 1 tablet (10 mg total) by mouth daily at 6 PM. What changed:   medication strength  how much to take  when to take this   sevelamer carbonate 800 MG tablet Commonly known as: RENVELA Take 1 tablet (800 mg total) by mouth 3 (three) times daily with meals.   sodium chloride flush 0.9 % Soln Commonly known as: NS Inject 3 mLs into the vein as needed.   traMADol 50 MG tablet Commonly known as: ULTRAM Take 1 tablet (50 mg total) by mouth every 12 (twelve) hours as needed for moderate pain.   witch hazel-glycerin pad Commonly known as: TUCKS Apply topically as needed for itching.      The patient has been discharged on:   1.Beta Blocker:  Yes [   ]                              No   [ no  ]                              If No, reason: hypotensive on midodrine  2.Ace Inhibitor/ARB: Yes [   ]                                     No  [  no  ]                                      If No, reason: hypotensive on midodrine 3.Statin:   Yes [ yes  ]                  No  [   ]                  If No, reason:  4.Ecasa:  Yes  [ yes  ]                  No   [   ]                  If No, reason:   Follow Up Appointments: Follow-up Information    Bensimhon, Shaune Pascal, MD Follow up.   Specialty: Cardiology Contact  information: [place] [address] [phone]        Lajuana Matte, MD Follow up.   Specialty: Thoracic Surgery Contact information: [address] [place] [address] [phone]        [name], MD. Call in 1 day(s).   Specialty: Family Medicine Contact information: [address] [place] [address] [phone]           Signed: Terance Hart ContePA-C 04/30/2019, 3:22 PM

## 2019-04-20 NOTE — Progress Notes (Addendum)
[facility] DAILY ICU PROGRESS NOTE                   [address]            [place] [address]          [phone]   4 Days Post-Op Procedure(s) (LRB): CORONARY ARTERY BYPASS GRAFTING (CABG)X3  , WITH ENDOSCOPIC HARVESTING OF RIGHT GREATER SAPHENOUS VEIN (N/A) TRANSESOPHAGEAL ECHOCARDIOGRAM (TEE) (N/A)  Total Length of Stay:  LOS: 4 days   Subjective: Patient on CRRT. Patient had nausea earlier this am. He states this is "gone now'. He denies abdominal pain. Respiratory at bedside to place a line.  Objective: Vital signs in last 24 hours: Temp:  [96.4 F (35.8 C)-98 F (36.7 C)] 98 F (36.7 C) (09/28 0839) Pulse Rate:  [73-95] 80 (09/28 0800) Cardiac Rhythm: Normal sinus rhythm;Atrial fibrillation (09/28 0800) Resp:  [13-26] 13 (09/28 0800) BP: (74-125)/(47-75) 107/57 (09/28 0800) SpO2:  [87 %-100 %] 100 % (09/28 0800) Arterial Line BP: (79-116)/(44-60) 82/60 (09/27 1400) Weight:  [132 kg] 132 kg (09/28 0500)  Filed Weights   04/18/19 0600 04/19/19 0500 04/20/19 0500  Weight: (!) 136.3 kg 134.7 kg 132 kg    Weight change: -2.7 kg   Hemodynamic parameters for last 24 hours: PAP: (33-50)/(20-36) 39/26 CVP:  [8 mmHg-34 mmHg] 8 mmHg PCWP:  [17 mmHg] 17 mmHg CO:  [6.9 L/min-10.1 L/min] 7.7 L/min CI:  [2.8 L/min/m2-4.1 L/min/m2] 3.1 L/min/m2  Intake/Output from previous day: 09/27 0701 - 09/28 0700 In: 3034.7 [P.O.:20; I.V.:2405.1; Blood:365.3; IV Piggyback:200.2] Out: 4646 [Urine:32; Drains:3; Chest Tube:80]  Intake/Output this shift: Total I/O In: 101.2 [I.V.:101.2] Out: 234 [Other:234]  Current Meds: Scheduled Meds: . sodium chloride   Intravenous Once  . sodium chloride   Intravenous Once  . acetaminophen  1,000 mg Oral Q6H   Or  . acetaminophen (TYLENOL) oral liquid 160 mg/5 mL  1,000 mg Per Tube Q6H  . acetaminophen (TYLENOL) oral liquid 160 mg/5 mL  650 mg Per Tube Once   Or  . acetaminophen  650 mg Rectal Once  . aspirin EC  325 mg Oral  Daily   Or  . aspirin  324 mg Per Tube Daily  . B-complex with vitamin C  1 tablet Oral Daily  . bisacodyl  10 mg Oral Daily   Or  . bisacodyl  10 mg Rectal Daily  . Chlorhexidine Gluconate Cloth  6 each Topical Daily  . docusate sodium  200 mg Oral Daily  . febuxostat  40 mg Oral QODAY  . feeding supplement  1 Container Oral TID BM  . feeding supplement (PRO-STAT SUGAR FREE 64)  30 mL Oral TID BM  . fluticasone  2 spray Each Nare Daily  . insulin aspart  0-24 Units Subcutaneous Q4H  . mouth rinse  15 mL Mouth Rinse BID  . pantoprazole  40 mg Oral Daily  . rosuvastatin  5 mg Oral q1800  . sodium chloride flush  10-40 mL Intracatheter Q12H  . sodium chloride flush  3 mL Intravenous Q12H   Continuous Infusions: .  prismasol BGK 4/2.5 500 mL/hr at 04/20/19 0005  .  prismasol BGK 4/2.5 300 mL/hr at 04/20/19 0703  . sodium chloride 10 mL/hr at 04/20/19 0800  . sodium chloride    . sodium chloride Stopped (04/19/19 2000)  . sodium chloride    . amiodarone 30 mg/hr (04/20/19 0814)  . dexmedetomidine (PRECEDEX) IV infusion Stopped (04/17/19 0316)  . dextrose  5 % Impella 5.0 Purge solution Stopped (04/16/19 2026)  . DOBUTamine 2.5 mcg/kg/min (04/20/19 0800)  . DOPamine    . epinephrine Stopped (04/19/19 0019)  . heparin 1,100 Units/hr (04/20/19 0815)  . lactated ringers    . lactated ringers    . lactated ringers 20 mL/hr at 04/20/19 0800  . norepinephrine (LEVOPHED) Adult infusion 8 mcg/min (04/20/19 0800)  . piperacillin-tazobactam (ZOSYN)  IV Stopped (04/20/19 [id number])  . prismasol BGK 4/2.5 1,500 mL/hr at 04/20/19 0602  . vasopressin (PITRESSIN) infusion - *FOR SHOCK* 0.02 Units/min (04/20/19 0800)   PRN Meds:.sodium chloride, Place/Maintain arterial line **AND** sodium chloride, lactated ringers, menthol-cetylpyridinium, metoprolol tartrate, midazolam, morphine injection, ondansetron (ZOFRAN) IV, oxyCODONE, sodium chloride flush, sodium chloride flush, traMADol  General  appearance: alert, cooperative and no distress Heart: IRRR Lungs: Slightly diminished bibsilar breath sounds Abdomen: Soft, obese, non tender, sporadic bowel sounds Extremities: Mild LE edema Wound: Sternal wound is clean and dry. RLE wounds are clean and dry  Lab Results: CBC: Recent Labs    04/19/19 1548 04/20/19 0422 04/20/19 0515  WBC 21.8* 19.7*  --   HGB 7.7* 7.2* 7.5*  HCT 23.1* 22.6* 22.0*  PLT 84* 89*  --    BMET:  Recent Labs    04/19/19 1548 04/20/19 0422 04/20/19 0515  NA 135 136 135  K 4.3 4.2 4.1  CL 101 102  --   CO2 24 28  --   GLUCOSE 127* 119*  --   BUN 24* 20  --   CREATININE 3.05* 2.70*  --   CALCIUM 11.2* 11.2*  --     CMET: Lab Results  Component Value Date   WBC 19.7 (H) 04/20/2019   HGB 7.5 (L) 04/20/2019   HCT 22.0 (L) 04/20/2019   PLT 89 (L) 04/20/2019   GLUCOSE 119 (H) 04/20/2019   CHOL 201 (H) 04/16/2019   TRIG 178 (H) 04/16/2019   HDL 23 (L) 04/16/2019   LDLDIRECT 155.0 01/20/2009   LDLCALC 142 (H) 04/16/2019   ALT 8 02/05/2019   AST 10 (L) 02/05/2019   NA 135 04/20/2019   K 4.1 04/20/2019   CL 102 04/20/2019   CREATININE 2.70 (H) 04/20/2019   BUN 20 04/20/2019   CO2 28 04/20/2019   TSH 2.840 08/25/2018   PSA 1.28 03/01/2016   INR 1.5 (H) 04/16/2019   HGBA1C 5.5 04/17/2019      PT/INR: No results for input(s): LABPROT, INR in the last 72 hours. Radiology: No results found.   Assessment/Plan: S/P Procedure(s) (LRB): CORONARY ARTERY BYPASS GRAFTING (CABG)X3  , WITH ENDOSCOPIC HARVESTING OF RIGHT GREATER SAPHENOUS VEIN (N/A) TRANSESOPHAGEAL ECHOCARDIOGRAM (TEE) (N/A)   1. CV-S/p STEMI, cardiogenic shock. Impella removed yesterday. PAF-HR in the 80's this am. Will likely resume Apixaban once chest tube have been removed. On Dobutamine, Neo Synephrine, 2. Pulmonary-on 2 liters of oxygen via [place]. Chest tubes with 80 cc last 24 hours. Will order CXR for am. Encourage incentive spirometer. 3. Acute on chronic systolic heart  failure-likely related to ischemic cardiomyopathy 4. Anemia-H and H this am slightly decreased to 7.2 and 22.6 5. CBGs 99/106/98. Pre op HGA1C 5.5. Will likely stop accu checks and SS in a few days. 6. Thrombocytopenia-platelets 89,000 7. ESRD-Creatinine decreased this am to 2.7. On CRRT. 8. Remove swan, place central and a lines. 9. JP drain right thigh with little output. Likely remove. 10. Secondary hyperparathyroidism-per nephrology 11. ID-on Zosyn. WBC decreased to 19,700. No fever last 24 hours.   Donielle Liston Alba  PA-C 04/20/2019 8:49 AM   Agree with above Will remove swan, and place new A-line, and CVL Will bulb CT today.  Will remove once patient has mobilized On CRRT, and pulling fluid Will wean vaso, and levo.  Will consider adding midodrine if pt remains vasoplegic.  Continue ICU care.  Forest Pruden Bary Leriche

## 2019-04-20 NOTE — Progress Notes (Signed)
RT attempted Arterial line insertion x2 with Doppler. Unsuccessful both times. RN was made aware. RN to notfiy MD.

## 2019-04-20 NOTE — Progress Notes (Signed)
[facility] for Heparin  Indication: atrial fibrillation  Allergies  Allergen Reactions  . Codeine Phosphate Other (See Comments)    Hyperactive     Patient Measurements: Height: 6' (182.9 cm) Weight: 291 lb 0.1 oz (132 kg) IBW/kg (Calculated) : 77.6  Heparin dosing weight: 107.5  Vital Signs: Temp: 98.1 F (36.7 C) (09/28 2000) Temp Source: Oral (09/28 2000) BP: 99/60 (09/28 2300) Pulse Rate: 93 (09/28 2300)  Labs: Recent Labs    04/18/19 0541  04/19/19 0403  04/19/19 1130 04/19/19 1548 04/20/19 0422 04/20/19 0515 04/20/19 1605 04/20/19 2216  HGB 8.4*   < >  --    < > 7.8* 7.7* 7.2* 7.5*  --   --   HCT 24.6*   < >  --    < > 23.1* 23.1* 22.6* 22.0*  --   --   PLT 117*  --   --    < > 76* 84* 89*  --   --   --   APTT 45*  --  74*  --   --   --  42*  --   --   --   HEPARINUNFRC  --   --   --   --   --   --  <0.10*  --   --  <0.10*  CREATININE 4.58*   < > 3.24*  --   --  3.05* 2.70*  --  2.84*  --    < > = values in this interval not displayed.    Estimated Creatinine Clearance: 34.5 mL/min (A) (by C-G formula based on SCr of 2.84 mg/dL (H)).   Assessment: 67 y.o. male with Afib for heparin  Goal of Therapy:  Heparin level 0.3-0.5 with recent Impella pull Monitor platelets by anticoagulation protocol: Yes   Plan:  Increase Heparin 1650 units/hr Follow-up am labs.  Phillis Knack, PharmD, BCPS

## 2019-04-20 NOTE — Progress Notes (Addendum)
PHARMACY NOTE:  ANTIMICROBIAL RENAL DOSAGE ADJUSTMENT  Current antimicrobial regimen includes a mismatch between antimicrobial dosage and estimated renal function.  As per policy approved by the Pharmacy & Therapeutics and Medical Executive Committees, the antimicrobial dosage will be adjusted accordingly.  Current antimicrobial dosage:  Zosyn 2.25 q6 hours  Indication: Sepsis  Renal Function:  Estimated Creatinine Clearance: 36.3 mL/min (A) (by C-G formula based on SCr of 2.7 mg/dL (H)). []      On intermittent HD, scheduled: [x]      On CRRT    Antimicrobial dosage has been changed to:  3.375 q6 h   Thank you for allowing pharmacy to be a part of this patient's care.  Sherren Kerns, PharmD PGY1 Acute Care Pharmacy Resident [phone] 04/20/2019 2:07 PM

## 2019-04-20 NOTE — Progress Notes (Addendum)
ANTICOAGULATION CONSULT NOTE - Follow Up Consult  Pharmacy Consult for Heparin  Indication: atrial fibrillation  Allergies  Allergen Reactions  . Codeine Phosphate Other (See Comments)    Hyperactive     Patient Measurements: Height: 6' (182.9 cm) Weight: 291 lb 0.1 oz (132 kg) IBW/kg (Calculated) : 77.6  Heparin dosing weight: 107.5  Vital Signs: Temp: 97.2 F (36.2 C) (09/28 1200) Temp Source: Core (09/28 1100) BP: 103/52 (09/28 1300) Pulse Rate: 80 (09/28 1300)  Labs: Recent Labs    04/18/19 0541  04/19/19 0403  04/19/19 1130 04/19/19 1548 04/20/19 0422 04/20/19 0515  HGB 8.4*   < >  --    < > 7.8* 7.7* 7.2* 7.5*  HCT 24.6*   < >  --    < > 23.1* 23.1* 22.6* 22.0*  PLT 117*  --   --    < > 76* 84* 89*  --   APTT 45*  --  74*  --   --   --  42*  --   HEPARINUNFRC  --   --   --   --   --   --  <0.10*  --   CREATININE 4.58*   < > 3.24*  --   --  3.05* 2.70*  --    < > = values in this interval not displayed.    Estimated Creatinine Clearance: 36.3 mL/min (A) (by C-G formula based on SCr of 2.7 mg/dL (H)).   Medical History: Past Medical History:  Diagnosis Date  . Cervical disc disease   . Chronic kidney disease   . COLONIC POLYPS, HX OF 06/27/2007  . EXOGENOUS OBESITY 01/30/2010  . GERD (gastroesophageal reflux disease)    PMH  . GOUT 01/30/2010  . HYPERCHOLESTEROLEMIA 06/30/2007  . HYPERTENSION 06/27/2007  . LOW BACK PAIN 06/27/2007  . NEPHROLITHIASIS, HX OF 06/27/2007  . OSTEOARTHRITIS 06/27/2007  . SLEEP APNEA, OBSTRUCTIVE, MODERATE 01/27/2009  . Wears dentures    upper    Assessment: 68yom admitted for STEMI with LM disease > Impella CP placed in cath lab and then OR for CABG. Impella pulled today at 1030  Heparin resumed for afib/flutter after impella pulled. Heparin level was undetectable this morning. No signs/symptoms of bleeding. CBC is low with Hg at 7.5. Plts are also low at 89. Both are low likely due to recent CABG and impella.  Goal of  Therapy:  Heparin level 0.3-0.5 with recent Impella pull Monitor platelets by anticoagulation protocol: Yes   Plan:  Resume heparin IV at 1400 units/hr Check next heparin level in 8 hours Daily heparin level, CBC Monitor s/s bleeding Plan noted to switch to apixaban once chest tube pulled.  Sherren Kerns, PharmD PGY1 Acute Care Pharmacy Resident [phone] 04/20/2019       1:45 PM  Please check [email or web address] for unit-specific pharmacist phone numbers

## 2019-04-20 NOTE — Progress Notes (Signed)
[facility] Progress Note  Subjective: pt doing well, weaning pressors, no resp issues. I/O neg 1.6 L yesterday.   Vitals:   04/20/19 0900 04/20/19 0930 04/20/19 0945 04/20/19 1000  BP: (!) 107/57 107/60 105/61 115/62  Pulse: 79 77 79 79  Resp: 14 13 (!) 24 13  Temp: (!) 97.2 F (36.2 C) (!) 97 F (36.1 C) (!) 97 F (36.1 C) (!) 97.2 F (36.2 C)  TempSrc: Core Core    SpO2: 100% 100% 98% 100%  Weight:      Height:        Inpatient medications: . sodium chloride   Intravenous Once  . acetaminophen  1,000 mg Oral Q6H   Or  . acetaminophen (TYLENOL) oral liquid 160 mg/5 mL  1,000 mg Per Tube Q6H  . acetaminophen (TYLENOL) oral liquid 160 mg/5 mL  650 mg Per Tube Once   Or  . acetaminophen  650 mg Rectal Once  . aspirin EC  325 mg Oral Daily   Or  . aspirin  324 mg Per Tube Daily  . B-complex with vitamin C  1 tablet Oral Daily  . bisacodyl  10 mg Oral Daily   Or  . bisacodyl  10 mg Rectal Daily  . Chlorhexidine Gluconate Cloth  6 each Topical Daily  . docusate sodium  200 mg Oral Daily  . febuxostat  40 mg Oral QODAY  . feeding supplement  1 Container Oral TID BM  . feeding supplement (PRO-STAT SUGAR FREE 64)  30 mL Oral TID BM  . fluticasone  2 spray Each Nare Daily  . insulin aspart  0-24 Units Subcutaneous Q4H  . mouth rinse  15 mL Mouth Rinse BID  . pantoprazole  40 mg Oral Daily  . rosuvastatin  5 mg Oral q1800  . sodium chloride flush  10-40 mL Intracatheter Q12H  . sodium chloride flush  3 mL Intravenous Q12H   .  prismasol BGK 4/2.5 500 mL/hr at 04/20/19 0954  .  prismasol BGK 4/2.5 300 mL/hr at 04/20/19 0703  . sodium chloride 10 mL/hr at 04/20/19 1000  . sodium chloride    . sodium chloride Stopped (04/19/19 2000)  . sodium chloride    . amiodarone 30 mg/hr (04/20/19 1000)  . dexmedetomidine (PRECEDEX) IV infusion Stopped (04/17/19 0316)  . dextrose 5 % Impella 5.0 Purge solution Stopped (04/16/19 2026)  . DOBUTamine 2.5 mcg/kg/min  (04/20/19 1000)  . DOPamine    . epinephrine Stopped (04/19/19 0019)  . heparin 1,100 Units/hr (04/20/19 1000)  . lactated ringers    . lactated ringers    . lactated ringers 20 mL/hr at 04/20/19 1000  . norepinephrine (LEVOPHED) Adult infusion 8 mcg/min (04/20/19 1000)  . piperacillin-tazobactam (ZOSYN)  IV Stopped (04/20/19 [id number])  . prismasol BGK 4/2.5 1,500 mL/hr at 04/20/19 0950  . vasopressin (PITRESSIN) infusion - *FOR SHOCK* 0.02 Units/min (04/20/19 1000)   sodium chloride, Place/Maintain arterial line **AND** sodium chloride, lactated ringers, menthol-cetylpyridinium, metoprolol tartrate, midazolam, morphine injection, ondansetron (ZOFRAN) IV, oxyCODONE, sodium chloride flush, sodium chloride flush, traMADol    Exam: General:NAD, comfortable Heart:RRR, s1s2 nl Lungs: Surgical wound with dressing in chest, lungs clear with decreased breath sound basally, no wheezing Abdomen:soft, Non-tender, non-distended Extremities: 1+ lower extremity edema  Dialysis Access: RUE AVF + bruit    Dialysis:  [facility] MWF  - new start, had OP HD on 9/21 and 9/23 prior to admission  3h  132kg  300/600  RUE AVF  2/2 bath  Hep  none   venofer 100 mg ordered each treatment x 4, Hb 8.5 on 9/21  no ESA order yet  PTH 1366 on 04/15/19   Assessment/ Plan #Cardiogenic shock/STEMI: Status post emergent CABG on 9/24.  SP Impella, removed Sunday. As per heart failure team and CT surgery.  Drips are weaning down now.   # ESRD: new start had 1st / 2nd HD last week prior to admission. Had post -op cardiogenic shock and started CRRT here on 9/25. D#4. Still some vol excess. Last CXR 9/26 min edema.  Back on systemic heparin gtt. Tolerating 100 cc/hr UF, will continue same.   # Anemia: Due to ABLA and CKD: Transfuse blood prn  # Secondary hyperparathyroidism: hypercalcemic, not sure why, vit D has been on hold here.  PTH 1366 on 9/23 - outpatient calcium at 11.3 on 9/23  - cont to follow, low Ca+ bath when  on regular HD  # Acute on chronic systolic heart failure: Cont CRRT.  Heart failure team following.      Rob Zane Samson 04/20/2019, 10:47 AM  Iron/TIBC/Ferritin/ %Sat    Component Value Date/Time   IRON 137 04/16/2019 2343   IRON 32 (L) 01/26/2019 1100   TIBC 188 (L) 04/16/2019 2343   TIBC 226 (L) 01/26/2019 1100   FERRITIN 310 04/16/2019 2343   FERRITIN 301 01/26/2019 1100   IRONPCTSAT 73 (H) 04/16/2019 2343   IRONPCTSAT 14 (L) 01/26/2019 1100   Recent Labs  Lab 04/16/19 1744  04/20/19 0422 04/20/19 0515  NA  --    < > 136 135  K  --    < > 4.2 4.1  CL  --    < > 102  --   CO2  --    < > 28  --   GLUCOSE  --    < > 119*  --   BUN  --    < > 20  --   CREATININE  --    < > 2.70*  --   CALCIUM  --    < > 11.2*  --   PHOS  --    < > 4.0  --   ALBUMIN  --    < > 2.3*  --   INR 1.5*  --   --   --    < > = values in this interval not displayed.   No results for input(s): AST, ALT, ALKPHOS, BILITOT, PROT in the last 168 hours. Recent Labs  Lab 04/20/19 0422 04/20/19 0515  WBC 19.7*  --   HGB 7.2* 7.5*  HCT 22.6* 22.0*  PLT 89*  --

## 2019-04-20 NOTE — Progress Notes (Addendum)
Advanced Heart Failure Rounding Note  PCP-Cardiologist: No primary care provider on file.   Subjective:    9/24 S/P CABG x3. Impella in place.  9/27 Impella removed.   Remains on CRRT. On NE 8, dobutamine 2.5 and VP 0.04. Epi off  Denies SOB. Having some chest soreness.   UF at 100 cc/hr, net I/O -1611.   Hgb down to 7.2.   CVP 10  PA 40/27  PCWP 17  Thermo CO 7.7  CI 3.1   Objective:   Weight Range: 132 kg Body mass index is 39.47 kg/m.   Vital Signs:   Temp:  [96.3 F (35.7 C)-97.9 F (36.6 C)] 97 F (36.1 C) (09/28 0700) Pulse Rate:  [73-95] 83 (09/28 0700) Resp:  [13-26] 20 (09/28 0700) BP: (74-125)/(47-75) 111/63 (09/28 0700) SpO2:  [87 %-100 %] 96 % (09/28 0700) Arterial Line BP: (79-133)/(44-82) 82/60 (09/27 1400) Weight:  [132 kg] 132 kg (09/28 0500) Last BM Date: (PTA)  Weight change: Filed Weights   04/18/19 0600 04/19/19 0500 04/20/19 0500  Weight: (!) 136.3 kg 134.7 kg 132 kg    Intake/Output:   Intake/Output Summary (Last 24 hours) at 04/20/2019 0705 Last data filed at 04/20/2019 0700 Gross per 24 hour  Intake 3034.74 ml  Output 4646 ml  Net -1611.26 ml      Physical Exam   CVP 14 General: . In bed  No resp difficulty HEENT: normal Neck: supple. JVP 12-13  Carotids 2+ bilat; no bruits. No lymphadenopathy or thryomegaly appreciated. Cor: PMI nondisplaced. Irregular rate & rhythm. No rubs, gallops or murmurs. Lungs: clear Abdomen: soft, nontender, nondistended. No hepatosplenomegaly. No bruits or masses. Good bowel sounds. Extremities: no cyanosis, clubbing, rash, edema. L groin ecchymotic. R groin swan . RUE AVF  Neuro: alert & orientedx3, cranial nerves grossly intact. moves all 4 extremities w/o difficulty. Affect pleasant GU: Foley     Telemetry    A fib 80-90s   EKG    N/A  Labs    CBC Recent Labs    04/19/19 1130 04/19/19 1548 04/20/19 0422 04/20/19 0515  WBC 20.1* 21.8* 19.7*  --   NEUTROABS 16.4* 17.4*   --   --   HGB 7.8* 7.7* 7.2* 7.5*  HCT 23.1* 23.1* 22.6* 22.0*  MCV 88.2 89.2 91.1  --   PLT 76* 84* 89*  --    Basic Metabolic Panel Recent Labs    04/19/19 0403  04/19/19 1548 04/20/19 0422 04/20/19 0515  NA 136   < > 135 136 135  K 4.1   < > 4.3 4.2 4.1  CL 102  --  101 102  --   CO2 25  --  24 28  --   GLUCOSE 125*  --  127* 119*  --   BUN 25*  --  24* 20  --   CREATININE 3.24*  --  3.05* 2.70*  --   CALCIUM 11.1*  --  11.2* 11.2*  --   MG 2.2  --   --  2.4  --   PHOS 4.8*  --  4.6 4.0  --    < > = values in this interval not displayed.   Liver Function Tests Recent Labs    04/19/19 1548 04/20/19 0422  ALBUMIN 2.4* 2.3*   No results for input(s): LIPASE, AMYLASE in the last 72 hours. Cardiac Enzymes No results for input(s): CKTOTAL, CKMB, CKMBINDEX, TROPONINI in the last 72 hours.  BNP: BNP (last 3 results) Recent  Labs    08/25/18 1746 01/30/19 0819 02/05/19 0951  BNP 513.0* 1,138.2* 1,186.2*    ProBNP (last 3 results) No results for input(s): PROBNP in the last 8760 hours.   D-Dimer No results for input(s): DDIMER in the last 72 hours. Hemoglobin A1C Recent Labs    04/17/19 0819  HGBA1C 5.5   Fasting Lipid Panel No results for input(s): CHOL, HDL, LDLCALC, TRIG, CHOLHDL, LDLDIRECT in the last 72 hours. Thyroid Function Tests No results for input(s): TSH, T4TOTAL, T3FREE, THYROIDAB in the last 72 hours.  Invalid input(s): FREET3  Other results:   Imaging    No results found.   Medications:     Scheduled Medications: . sodium chloride   Intravenous Once  . acetaminophen  1,000 mg Oral Q6H   Or  . acetaminophen (TYLENOL) oral liquid 160 mg/5 mL  1,000 mg Per Tube Q6H  . acetaminophen (TYLENOL) oral liquid 160 mg/5 mL  650 mg Per Tube Once   Or  . acetaminophen  650 mg Rectal Once  . aspirin EC  325 mg Oral Daily   Or  . aspirin  324 mg Per Tube Daily  . B-complex with vitamin C  1 tablet Oral Daily  . bisacodyl  10 mg Oral  Daily   Or  . bisacodyl  10 mg Rectal Daily  . Chlorhexidine Gluconate Cloth  6 each Topical Daily  . docusate sodium  200 mg Oral Daily  . febuxostat  40 mg Oral QODAY  . feeding supplement  1 Container Oral TID BM  . feeding supplement (PRO-STAT SUGAR FREE 64)  30 mL Oral TID BM  . fluticasone  2 spray Each Nare Daily  . insulin aspart  0-24 Units Subcutaneous Q4H  . mouth rinse  15 mL Mouth Rinse BID  . pantoprazole  40 mg Oral Daily  . rosuvastatin  5 mg Oral q1800  . sodium chloride flush  10-40 mL Intracatheter Q12H  . sodium chloride flush  3 mL Intravenous Q12H    Infusions: .  prismasol BGK 4/2.5 500 mL/hr at 04/20/19 0005  .  prismasol BGK 4/2.5 300 mL/hr at 04/20/19 0703  . sodium chloride 10 mL/hr at 04/20/19 0700  . sodium chloride    . sodium chloride Stopped (04/19/19 2000)  . amiodarone 60 mg/hr (04/20/19 0700)  . dexmedetomidine (PRECEDEX) IV infusion Stopped (04/17/19 0316)  . dextrose 5 % Impella 5.0 Purge solution Stopped (04/16/19 2026)  . DOBUTamine 2.5 mcg/kg/min (04/20/19 0700)  . DOPamine    . epinephrine Stopped (04/19/19 0019)  . heparin 1,100 Units/hr (04/20/19 0700)  . lactated ringers    . lactated ringers    . lactated ringers 20 mL/hr at 04/20/19 0700  . norepinephrine (LEVOPHED) Adult infusion 8 mcg/min (04/20/19 0700)  . piperacillin-tazobactam (ZOSYN)  IV Stopped (04/20/19 [id number])  . prismasol BGK 4/2.5 1,500 mL/hr at 04/20/19 0602  . vasopressin (PITRESSIN) infusion - *FOR SHOCK* 0.04 Units/min (04/20/19 0700)    PRN Medications: sodium chloride, lactated ringers, menthol-cetylpyridinium, metoprolol tartrate, midazolam, morphine injection, ondansetron (ZOFRAN) IV, oxyCODONE, sodium chloride flush, sodium chloride flush, traMADol   Assessment/Plan   1. Shock: Suspect cardiogenic shock in setting of atypical atrial flutter with RVR and ischemia from severe left main stenosis. May have developed a bit of sepsis component on 9/25.  Echo 9/25  EF 30%.  Stable MAP this morning.  -  Now on NE 8, dobutamine 2.5, epi off, VP 0.04 - Cardiac Output ok. Remove swan today but leave  CVL, if we can wean off vasopressin and NE today can use peripheral IVs.  If not, will need another CVL and femoral line out tomorrow. - Place arterial line to follow BP more easily.    2. CAD: Concern for acute anterior MI and patient has occluded mid LAD.  However, suspect this was CTO.  Dr. Angelena Form did angioplasty but unable to restore flow.  Patient has a 90% distal left main stenosis compromising flow in LCx and large diagonal.  Suspect this led to ischemia/chest pain when the patient went into rapid atrial flutter PTA  - Patient taken urgently for CABG supported by Impella. S/P CABG x3 on 9/24 - Statin and ASA  - Continue heparin gtt, eventually transition to Eliquis given atrial flutter.  - Hold b-blocker for now while on pressors/Impella  3. Acute on chronic systolic CHF: Echo with EF in 25% range with peri-apical severe hypokinesis.  Suspect ischemic cardiomyopathy.  Elevated filling pressures on RHC.   - Echo EF 30% - CVP 13-14, continue UF at 100 cc/hr as we bring down volume gently.  - See above  4. Paroxysmal Atrial flutter/fib: He had atypical atrial flutter initially, post-op had atrial fibrillation.  - Continue amiodarone gtt, he has been in and out of afib.  Can decrease to 30 mg/hr.  - On heparin drip. Hgb down. Eventual switch to Eliquis once CTs out.   5. ESRD: Recent start on HD.  Nephrology following. On CVVD.   6. ID - fevers resolved. WBC 32k-> 20->19.7 k - He is on Zosyn.  - cx NGTD  7. Anemia: Hgb 7.2.  Will need another unit of blood today.  No obvious source of bleeding. Abdomen nontender.    8. Thrombocytopenia - Plts 76>89K (down 110-120k). - HIT pending but hopefully post-op platelet drop and not HIT, remains on heparin for now.   Length of Stay: 4  Amy Clegg, NP  04/20/2019, 7:05 AM  Advanced Heart Failure Team  Pager [phone] (M-F; 7a - 4p)  Please contact [facility] for night-coverage after hours (4p -7a ) and weekends on [email or web address]  Patient seen with NP, agree with the above note.    CVVH ongoing with UF 100 cc/hr, CVP 13-14.  Good cardiac output, CI 3.1 this morning.  Awake/alert, remains on pressors/inotropes as above.  He is in NSR on amiodarone 60 mg/hr and remains on heparin gtt.   On exam, JVP 10 cm.  Trace ankle edema.  Regular S1S2, no murmur.  Decreased BS at bases.  Abdomen soft/NT.   Today, I will remove his Luiz Blare but leave femoral line for the time being.  - Continue dobutamine 2.5 but will aim to wean off vasopressin and then norepinephrine.  Place arterial line to help with this.  - If we can wean off pressors today, can take out femoral line tomorrow and use peripheral IV in left arm.  If unable to wean pressors, will need femoral line out and another CVL in left neck.   CVP remains high, continue current UF at 100 cc/hr via CVVH.   He is in NSR, decrease amiodarone to 30 mg/hr.  Continuing heparin gtt. Platelets slowly increasing now, HIT sent but hopefully just post-op platelet drop.  Eventually will transition to Eliquis.   Afebrile, WBCs trending down, remains on Zosyn.   Post-op anemia, hgb 7.2.  Will give unit PRBCs today.   CRITICAL CARE Performed by: Loralie Champagne  Total critical care time: 40 minutes  Critical care time was exclusive  of separately billable procedures and treating other patients.  Critical care was necessary to treat or prevent imminent or life-threatening deterioration.  Critical care was time spent personally by me on the following activities: development of treatment plan with patient and/or surrogate as well as nursing, discussions with consultants, evaluation of patient's response to treatment, examination of patient, obtaining history from patient or surrogate, ordering and performing treatments and interventions, ordering and review of  laboratory studies, ordering and review of radiographic studies, pulse oximetry and re-evaluation of patient's condition.  Loralie Champagne 04/20/2019 8:17 AM

## 2019-04-21 LAB — CBC
HCT: 23.6 % — ABNORMAL LOW (ref 39.0–52.0)
Hemoglobin: 7.7 g/dL — ABNORMAL LOW (ref 13.0–17.0)
MCH: 29.4 pg (ref 26.0–34.0)
MCHC: 32.6 g/dL (ref 30.0–36.0)
MCV: 90.1 fL (ref 80.0–100.0)
Platelets: 99 10*3/uL — ABNORMAL LOW (ref 150–400)
RBC: 2.62 MIL/uL — ABNORMAL LOW (ref 4.22–5.81)
RDW: 17.1 % — ABNORMAL HIGH (ref 11.5–15.5)
WBC: 16.8 10*3/uL — ABNORMAL HIGH (ref 4.0–10.5)
nRBC: 0.6 % — ABNORMAL HIGH (ref 0.0–0.2)

## 2019-04-21 LAB — TYPE AND SCREEN
ABO/RH(D): O NEG
Antibody Screen: NEGATIVE
Unit division: 0

## 2019-04-21 LAB — RENAL FUNCTION PANEL
Albumin: 2.3 g/dL — ABNORMAL LOW (ref 3.5–5.0)
Albumin: 2.3 g/dL — ABNORMAL LOW (ref 3.5–5.0)
Anion gap: 11 (ref 5–15)
Anion gap: 9 (ref 5–15)
BUN: 22 mg/dL (ref 8–23)
BUN: 21 mg/dL (ref 8–23)
CO2: 25 mmol/L (ref 22–32)
CO2: 26 mmol/L (ref 22–32)
Calcium: 11.2 mg/dL — ABNORMAL HIGH (ref 8.9–10.3)
Calcium: 11.3 mg/dL — ABNORMAL HIGH (ref 8.9–10.3)
Chloride: 101 mmol/L (ref 98–111)
Chloride: 101 mmol/L (ref 98–111)
Creatinine, Ser: 2.74 mg/dL — ABNORMAL HIGH (ref 0.61–1.24)
Creatinine, Ser: 2.6 mg/dL — ABNORMAL HIGH (ref 0.61–1.24)
GFR calc Af Amer: 26 mL/min — ABNORMAL LOW (ref >60)
GFR calc Af Amer: 28 mL/min — ABNORMAL LOW (ref >60)
GFR calc non Af Amer: 23 mL/min — ABNORMAL LOW (ref >60)
GFR calc non Af Amer: 24 mL/min — ABNORMAL LOW (ref >60)
Glucose, Bld: 94 mg/dL (ref 70–99)
Glucose, Bld: 95 mg/dL (ref 70–99)
Phosphorus: 4.1 mg/dL (ref 2.5–4.6)
Phosphorus: 3.5 mg/dL (ref 2.5–4.6)
Potassium: 4.2 mmol/L (ref 3.5–5.1)
Potassium: 4.2 mmol/L (ref 3.5–5.1)
Sodium: 137 mmol/L (ref 135–145)
Sodium: 136 mmol/L (ref 135–145)

## 2019-04-21 LAB — HEPARIN LEVEL (UNFRACTIONATED)
Heparin Unfractionated: 0.14 IU/mL — ABNORMAL LOW (ref 0.30–0.70)
Heparin Unfractionated: 0.18 IU/mL — ABNORMAL LOW (ref 0.30–0.70)
Heparin Unfractionated: 0.15 IU/mL — ABNORMAL LOW (ref 0.30–0.70)

## 2019-04-21 LAB — GLUCOSE, CAPILLARY
Glucose-Capillary: 91 mg/dL (ref 70–99)
Glucose-Capillary: 96 mg/dL (ref 70–99)
Glucose-Capillary: 97 mg/dL (ref 70–99)
Glucose-Capillary: 96 mg/dL (ref 70–99)
Glucose-Capillary: 89 mg/dL (ref 70–99)

## 2019-04-21 LAB — BASIC METABOLIC PANEL
Anion gap: 9 (ref 5–15)
BUN: 21 mg/dL (ref 8–23)
CO2: 25 mmol/L (ref 22–32)
Calcium: 11.2 mg/dL — ABNORMAL HIGH (ref 8.9–10.3)
Chloride: 102 mmol/L (ref 98–111)
Creatinine, Ser: 2.72 mg/dL — ABNORMAL HIGH (ref 0.61–1.24)
GFR calc Af Amer: 26 mL/min — ABNORMAL LOW (ref >60)
GFR calc non Af Amer: 23 mL/min — ABNORMAL LOW (ref >60)
Glucose, Bld: 94 mg/dL (ref 70–99)
Potassium: 4.2 mmol/L (ref 3.5–5.1)
Sodium: 136 mmol/L (ref 135–145)

## 2019-04-21 LAB — BPAM RBC
Blood Product Expiration Date: 202010172359
ISSUE DATE / TIME: 202009280857
Unit Type and Rh: 9500

## 2019-04-21 LAB — COOXEMETRY PANEL
Carboxyhemoglobin: 2 % — ABNORMAL HIGH (ref 0.5–1.5)
Methemoglobin: 1.1 % (ref 0.0–1.5)
O2 Saturation: 70.4 %
Total hemoglobin: 8.1 g/dL — ABNORMAL LOW (ref 12.0–16.0)

## 2019-04-21 LAB — APTT: aPTT: 58 seconds — ABNORMAL HIGH (ref 24–36)

## 2019-04-21 LAB — MAGNESIUM: Magnesium: 2.6 mg/dL — ABNORMAL HIGH (ref 1.7–2.4)

## 2019-04-21 LAB — LACTATE DEHYDROGENASE: LDH: 402 U/L — ABNORMAL HIGH (ref 98–192)

## 2019-04-21 MED ORDER — MIDODRINE HCL 5 MG PO TABS
2.5000 mg | ORAL_TABLET | Freq: Three times a day (TID) | ORAL | Status: DC
  Administered 2019-04-21 (×3): 2.5 mg via ORAL
  Filled 2019-04-21 (×3): qty 1

## 2019-04-21 NOTE — Progress Notes (Signed)
      [address]       [place], [address]             [phone]                 5 Days Post-Op Procedure(s) (LRB): CORONARY ARTERY BYPASS GRAFTING (CABG)X3  , WITH ENDOSCOPIC HARVESTING OF RIGHT GREATER SAPHENOUS VEIN (N/A) TRANSESOPHAGEAL ECHOCARDIOGRAM (TEE) (N/A)   Events: No events Continuing to pull fluid. _______________________________________________________________ Vitals: BP (!) 110/45   Pulse 88   Temp (!) 97.3 F (36.3 C) (Oral)   Resp (!) 21   Ht 6' (1.829 m)   Wt 130.6 kg   SpO2 96%   BMI 39.05 kg/m   - Neuro: alert NAD  - Cardiovascular: sinus, regular  Drips: Levo 8.  CVP:  [2 mmHg-17 mmHg] 6 mmHg  - Pulm: EWOB    ABG    Component Value Date/Time   PHART 7.376 04/20/2019 0515   PCO2ART 46.8 04/20/2019 0515   PO2ART 96.0 04/20/2019 0515   HCO3 27.4 04/20/2019 0515   TCO2 29 04/20/2019 0515   ACIDBASEDEF 4.0 (H) 04/18/2019 0559   O2SAT 70.4 04/21/2019 0730    - Abd: soft NT - Extremity: edematous  .Intake/Output      09/28 0701 - 09/29 0700 09/29 0701 - 09/30 0700   P.O. 555 390   I.V. (mL/kg) 1448.7 (11.1) 441.5 (3.4)   Blood 315    Other 0    IV Piggyback 150 50   Total Intake(mL/kg) 2468.7 (18.9) 881.5 (6.7)   Urine (mL/kg/hr) 15 (0)    Drains 0 3   Other 3986 1467   Chest Tube 60 70   Total Output 4061 1540   Net -1592.3 -658.5           _______________________________________________________________ Labs: CBC Latest Ref Rng & Units 04/21/2019 04/20/2019 04/20/2019  WBC 4.0 - 10.5 K/uL 16.8(H) - 19.7(H)  Hemoglobin 13.0 - 17.0 g/dL 7.7(L) 7.5(L) 7.2(L)  Hematocrit 39.0 - 52.0 % 23.6(L) 22.0(L) 22.6(L)  Platelets 150 - 400 K/uL 99(L) - 89(L)   CMP Latest Ref Rng & Units 04/21/2019 04/21/2019 04/20/2019  Glucose 70 - 99 mg/dL 94 94 97  BUN 8 - 23 mg/dL 21 22 20   Creatinine 0.61 - 1.24 mg/dL 2.72(H) 2.74(H) 2.84(H)  Sodium 135 - 145 mmol/L 136 137 137  Potassium 3.5 - 5.1 mmol/L 4.2 4.2 4.3  Chloride 98  - 111 mmol/L 102 101 102  CO2 22 - 32 mmol/L 25 25 29   Calcium 8.9 - 10.3 mg/dL 11.2(H) 11.2(H) 11.3(H)  Total Protein 6.5 - 8.1 g/dL - - -  Total Bilirubin 0.3 - 1.2 mg/dL - - -  Alkaline Phos 38 - 126 U/L - - -  AST 15 - 41 U/L - - -  ALT 0 - 44 U/L - - -    CXR: stable  _______________________________________________________________  Assessment and Plan: POD 5 s/p emergency CABG  Neuro: pain control CV: started on midodrine, will remove chest tube.  Wean levo Pulm: pulm toilet Renal: on CRRT, pulling fluid GI: on clears Heme: stable ID: [email or web address] Endo: SSI Dispo: continue ICU care.  Melodie Bouillon, MD 04/21/2019 3:45 PM

## 2019-04-21 NOTE — Progress Notes (Signed)
ANTICOAGULATION CONSULT NOTE - Follow Up Consult  Pharmacy Consult for Heparin  Indication: atrial fibrillation  Allergies  Allergen Reactions  . Codeine Phosphate Other (See Comments)    Hyperactive     Patient Measurements: Height: 6' (182.9 cm) Weight: 287 lb 14.7 oz (130.6 kg) IBW/kg (Calculated) : 77.6  Heparin dosing weight: 107.5  Vital Signs: Temp: 97.8 F (36.6 C) (09/29 1604) Temp Source: Oral (09/29 1604) BP: 101/60 (09/29 1915) Pulse Rate: 90 (09/29 1915)  Labs: Recent Labs    04/19/19 0403  04/19/19 1548 04/20/19 0422 04/20/19 0515 04/20/19 1605  04/21/19 0423 04/21/19 0732 04/21/19 1544 04/21/19 1645  HGB  --    < > 7.7* 7.2* 7.5*  --   --  7.7*  --   --   --   HCT  --    < > 23.1* 22.6* 22.0*  --   --  23.6*  --   --   --   PLT  --    < > 84* 89*  --   --   --  99*  --   --   --   APTT 74*  --   --  42*  --   --   --   --  58*  --   --   HEPARINUNFRC  --   --   --  <0.10*  --   --    < >  --  0.14* 0.18* 0.15*  CREATININE 3.24*  --  3.05* 2.70*  --  2.84*  --  2.72*  2.74*  --  2.60*  --    < > = values in this interval not displayed.    Estimated Creatinine Clearance: 37.5 mL/min (A) (by C-G formula based on SCr of 2.6 mg/dL (H)).   Medical History: Past Medical History:  Diagnosis Date  . Cervical disc disease   . Chronic kidney disease   . COLONIC POLYPS, HX OF 06/27/2007  . EXOGENOUS OBESITY 01/30/2010  . GERD (gastroesophageal reflux disease)    PMH  . GOUT 01/30/2010  . HYPERCHOLESTEROLEMIA 06/30/2007  . HYPERTENSION 06/27/2007  . LOW BACK PAIN 06/27/2007  . NEPHROLITHIASIS, HX OF 06/27/2007  . OSTEOARTHRITIS 06/27/2007  . SLEEP APNEA, OBSTRUCTIVE, MODERATE 01/27/2009  . Wears dentures    upper    Assessment: 68yom admitted for STEMI with LM disease > Impella CP placed in cath lab and then OR for CABG. Impella pulled and heparin resumed for A-flutter/A-fib.  Heparin level  subtherapeutic at 0.15 on heparin rate of 1900 units/hr.   Running through central line and heparin level drawn peripherally.   No signs/symptoms of bleeding. Little CT oupt. CBC is low with Hg at 7.7. Plts are low at 99 but improving. Both are low likely due to recent CABG and impella. Of note, patient's HIT antibody was negative  Goal of Therapy:  Heparin level 0.3-0.5 with recent Impella pull Monitor platelets by anticoagulation protocol: Yes   Plan:  Increase heparin IV to 2100 units/hr Daily heparin level, CBC Monitor s/s bleeding Plan noted to switch to apixaban once chest tube pulled.  Bonnita Nasuti Pharm.D. CPP, BCPS Clinical Pharmacist [phone] 04/21/2019 7:28 PM    Please check [email or web address] for unit-specific pharmacist phone numbers

## 2019-04-21 NOTE — Progress Notes (Signed)
[facility] Progress Note  Subjective: stable, -1.5 L off yest on crrt.  Midodrine added 2.5 tid. Pt w/o c/o's.    Vitals:   04/21/19 1115 04/21/19 1130 04/21/19 1145 04/21/19 1200  BP: (!) 97/56 107/65 (!) 106/57 103/63  Pulse: 83 84 83 86  Resp: 18 14 11 20   Temp:      TempSrc:      SpO2: 100% 100% 100% 100%  Weight:      Height:        Inpatient medications: . sodium chloride   Intravenous Once  . acetaminophen  1,000 mg Oral Q6H   Or  . acetaminophen (TYLENOL) oral liquid 160 mg/5 mL  1,000 mg Per Tube Q6H  . acetaminophen (TYLENOL) oral liquid 160 mg/5 mL  650 mg Per Tube Once   Or  . acetaminophen  650 mg Rectal Once  . aspirin EC  325 mg Oral Daily   Or  . aspirin  324 mg Per Tube Daily  . B-complex with vitamin C  1 tablet Oral Daily  . bisacodyl  10 mg Oral Daily   Or  . bisacodyl  10 mg Rectal Daily  . Chlorhexidine Gluconate Cloth  6 each Topical Daily  . docusate sodium  200 mg Oral Daily  . febuxostat  40 mg Oral QODAY  . feeding supplement  1 Container Oral TID BM  . feeding supplement (PRO-STAT SUGAR FREE 64)  30 mL Oral TID BM  . fluticasone  2 spray Each Nare Daily  . insulin aspart  0-24 Units Subcutaneous Q4H  . mouth rinse  15 mL Mouth Rinse BID  . midodrine  2.5 mg Oral TID WC  . pantoprazole  40 mg Oral Daily  . rosuvastatin  5 mg Oral q1800  . sodium chloride flush  10-40 mL Intracatheter Q12H  . sodium chloride flush  3 mL Intravenous Q12H   .  prismasol BGK 4/2.5 500 mL/hr at 04/21/19 [id number]  .  prismasol BGK 4/2.5 300 mL/hr at 04/21/19 0033  . sodium chloride    . amiodarone 30 mg/hr (04/21/19 1200)  . DOBUTamine 2.5 mcg/kg/min (04/21/19 1200)  . DOPamine    . epinephrine Stopped (04/19/19 0019)  . heparin 1,900 Units/hr (04/21/19 1200)  . lactated ringers    . lactated ringers    . lactated ringers 10 mL/hr at 04/21/19 1200  . norepinephrine (LEVOPHED) Adult infusion 6 mcg/min (04/21/19 1200)  . piperacillin-tazobactam  3.375 g (04/21/19 1206)  . prismasol BGK 4/2.5 1,500 mL/hr at 04/21/19 [id number]   Place/Maintain arterial line **AND** sodium chloride, lactated ringers, menthol-cetylpyridinium, metoprolol tartrate, morphine injection, ondansetron (ZOFRAN) IV, oxyCODONE, sodium chloride flush, sodium chloride flush, traMADol    Exam: General:NAD, comfortable Heart:RRR, s1s2 nl Lungs: Surgical wound with dressing in chest, lungs clear with decreased breath sound basally, no wheezing Abdomen:soft, Non-tender, non-distended Extremities: 1+ lower extremity edema  Dialysis Access: RUE AVF + bruit    Dialysis:  [facility] MWF  - new start, had OP HD on 9/21 and 9/23 prior to admission  3h  132kg  300/600  RUE AVF  2/2 bath  Hep none   venofer 100 mg ordered each treatment x 4, Hb 8.5 on 9/21  no ESA order yet  PTH 1366 on 04/15/19   Assessment/ Plan #Cardiogenic shock/STEMI: Status post emergent CABG on 9/24.  SP Impella, removed Sunday. As per heart failure team and CT surgery.  On levo and dobutamine low dose now, weaning off per CT surg/ cardiology.   #  ESRD: new start had 1st / 2nd HD last week prior to admission. Had post -op cardiogenic shock and started CRRT here on 9/25.  On systemic heparin gtt. Tolerating 100 cc/hr UF, will continue. Approaching euvolemia.   # Anemia: Due to ABLA and CKD: Transfuse blood prn  # Secondary hyperparathyroidism: hypercalcemic, not sure why, vit D has been on hold here.  PTH 1366 on 9/23 - outpatient calcium at 11.3 on 9/23  - cont to follow, low Ca+ bath when on regular HD  # Acute on chronic systolic heart failure: Cont CRRT.  Heart failure team following.      Rob Buffi Ewton 04/21/2019, 12:11 PM  Iron/TIBC/Ferritin/ %Sat    Component Value Date/Time   IRON 137 04/16/2019 2343   IRON 32 (L) 01/26/2019 1100   TIBC 188 (L) 04/16/2019 2343   TIBC 226 (L) 01/26/2019 1100   FERRITIN 310 04/16/2019 2343   FERRITIN 301 01/26/2019 1100   IRONPCTSAT 73 (H)  04/16/2019 2343   IRONPCTSAT 14 (L) 01/26/2019 1100   Recent Labs  Lab 04/16/19 1744  04/21/19 0423  NA  --    < > 136  137  K  --    < > 4.2  4.2  CL  --    < > 102  101  CO2  --    < > 25  25  GLUCOSE  --    < > 94  94  BUN  --    < > 21  22  CREATININE  --    < > 2.72*  2.74*  CALCIUM  --    < > 11.2*  11.2*  PHOS  --    < > 4.1  ALBUMIN  --    < > 2.3*  INR 1.5*  --   --    < > = values in this interval not displayed.   No results for input(s): AST, ALT, ALKPHOS, BILITOT, PROT in the last 168 hours. Recent Labs  Lab 04/21/19 0423  WBC 16.8*  HGB 7.7*  HCT 23.6*  PLT 99*

## 2019-04-21 NOTE — Progress Notes (Signed)
ANTICOAGULATION CONSULT NOTE - Follow Up Consult  Pharmacy Consult for Heparin  Indication: atrial fibrillation  Allergies  Allergen Reactions  . Codeine Phosphate Other (See Comments)    Hyperactive     Patient Measurements: Height: 6' (182.9 cm) Weight: 287 lb 14.7 oz (130.6 kg) IBW/kg (Calculated) : 77.6  Heparin dosing weight: 107.5  Vital Signs: Temp: 97.6 F (36.4 C) (09/29 0745) Temp Source: Oral (09/29 0745) BP: 96/58 (09/29 0815) Pulse Rate: 83 (09/29 0815)  Labs: Recent Labs    04/19/19 0403  04/19/19 1548 04/20/19 0422 04/20/19 0515 04/20/19 1605 04/20/19 2216 04/21/19 0423 04/21/19 0732  HGB  --    < > 7.7* 7.2* 7.5*  --   --  7.7*  --   HCT  --    < > 23.1* 22.6* 22.0*  --   --  23.6*  --   PLT  --    < > 84* 89*  --   --   --  99*  --   APTT 74*  --   --  42*  --   --   --   --  58*  HEPARINUNFRC  --   --   --  <0.10*  --   --  <0.10*  --  0.14*  CREATININE 3.24*  --  3.05* 2.70*  --  2.84*  --  2.72*  2.74*  --    < > = values in this interval not displayed.    Estimated Creatinine Clearance: 35.6 mL/min (A) (by C-G formula based on SCr of 2.74 mg/dL (H)).   Medical History: Past Medical History:  Diagnosis Date  . Cervical disc disease   . Chronic kidney disease   . COLONIC POLYPS, HX OF 06/27/2007  . EXOGENOUS OBESITY 01/30/2010  . GERD (gastroesophageal reflux disease)    PMH  . GOUT 01/30/2010  . HYPERCHOLESTEROLEMIA 06/30/2007  . HYPERTENSION 06/27/2007  . LOW BACK PAIN 06/27/2007  . NEPHROLITHIASIS, HX OF 06/27/2007  . OSTEOARTHRITIS 06/27/2007  . SLEEP APNEA, OBSTRUCTIVE, MODERATE 01/27/2009  . Wears dentures    upper    Assessment: 68yom admitted for STEMI with LM disease > Impella CP placed in cath lab and then OR for CABG. Impella pulled and heparin resumed for A-flutter/A-fib.  Heparin level this morning was subtherapeutic at 0.14 @ 0732 on heparin rate of 1,650 units/hr. No signs/symptoms of bleeding. CBC is low with Hg at 7.7.  Plts are low at 99 but improving. Both are low likely due to recent CABG and impella. Of note, patient's HIT antibody was negative  Goal of Therapy:  Heparin level 0.3-0.5 with recent Impella pull Monitor platelets by anticoagulation protocol: Yes   Plan:  Increase heparin IV to 1900 units/hr Check next heparin level in 6 hours Daily heparin level, CBC Monitor s/s bleeding Plan noted to switch to apixaban once chest tube pulled.  Sherren Kerns, PharmD PGY1 Acute Care Pharmacy Resident [phone] 04/21/2019       8:29 AM  Please check [email or web address] for unit-specific pharmacist phone numbers

## 2019-04-21 NOTE — Progress Notes (Addendum)
RN was informed during report that both patient's daughter Briggs Alcide and patient's son Donyel "Dian Situ" Payette have been visiting the patient.   Patient was previously intubated and this visitor exception was made however patient is now alert and oriented and able to communicate his own needs.   RN was informed by patient that both his daughter and son were planning on visiting today as they have been since patient was admitted to the hospital.  Marquette Old (daughter) at patient's bedside.  RN discussed visitation policy with Maggie Dam and that due to improvement in patient condition only one visitor per the remainder of the hospital stay would be permitted.  RN advised Burman Nieves that for today it would be okay for her to visit as well as the patient's son Jasiah "Dian Situ" Hilmer but moving forward only one of them would be allowed to visit.  Maggie voiced understanding and they are going to discuss this as a family and then will advise RN who the support person will be moving forward.  RN to notify the front entrance staff when support person is designated.   69 - Patient's son Dian Situ Varnell at bedside.  RN discussed visitor policy with patient's son, patient's son stated that his sister has informed him of this.   The family is aware only one visitor moving forward and they will discuss and inform RN of designated support person.

## 2019-04-21 NOTE — Progress Notes (Addendum)
Advanced Heart Failure Rounding Note  PCP-Cardiologist: No primary care provider on file.   Subjective:    9/24 S/P CABG x3. Impella in place.  9/27 Impella removed.   Remains on CRRT. On NE 8, dobutamine 2.5  UF at 100 cc/hr, net I/O -1.3   Hgb down to 7.7   Denies SOB.    Objective:   Weight Range: 130.6 kg Body mass index is 39.05 kg/m.   Vital Signs:   Temp:  [97 F (36.1 C)-98.3 F (36.8 C)] 97.8 F (36.6 C) (09/29 0400) Pulse Rate:  [77-102] 84 (09/29 0700) Resp:  [13-38] 16 (09/29 0700) BP: (76-118)/(33-75) 108/63 (09/29 0700) SpO2:  [78 %-100 %] 100 % (09/29 0700) Weight:  [130.6 kg-132 kg] 130.6 kg (09/29 0500) Last BM Date: (PTA)  Weight change: Filed Weights   04/20/19 0500 04/20/19 1300 04/21/19 0500  Weight: 132 kg 132 kg 130.6 kg    Intake/Output:   Intake/Output Summary (Last 24 hours) at 04/21/2019 0707 Last data filed at 04/21/2019 0700 Gross per 24 hour  Intake 2468.67 ml  Output 3846 ml  Net -1377.33 ml      Physical Exam   CVP 9 -10  General:  . No resp difficulty HEENT: normal Neck: supple. JVP 10-11. Carotids 2+ bilat; no bruits. No lymphadenopathy or thryomegaly appreciated. RIJU HD catheter. L subclavian  Cor: PMI nondisplaced. Regular rate & rhythm. No rubs, gallops or murmurs. Lungs: clear Abdomen: soft, nontender, nondistended. No hepatosplenomegaly. No bruits or masses. Good bowel sounds. Extremities: no cyanosis, clubbing, rash, edema. L and R groin ecchymotic.  Neuro: alert & orientedx3, cranial nerves grossly intact. moves all 4 extremities w/o difficulty. Affect pleasant     Telemetry   SR 80s with occasional PVCs   EKG    N/A  Labs    CBC Recent Labs    04/19/19 1130 04/19/19 1548 04/20/19 0422 04/20/19 0515 04/21/19 0423  WBC 20.1* 21.8* 19.7*  --  16.8*  NEUTROABS 16.4* 17.4*  --   --   --   HGB 7.8* 7.7* 7.2* 7.5* 7.7*  HCT 23.1* 23.1* 22.6* 22.0* 23.6*  MCV 88.2 89.2 91.1  --  90.1  PLT  76* 84* 89*  --  99*   Basic Metabolic Panel Recent Labs    04/20/19 0422  04/20/19 1605 04/21/19 0423  NA 136   < > 137 136  137  K 4.2   < > 4.3 4.2  4.2  CL 102  --  102 102  101  CO2 28  --  29 25  25   GLUCOSE 119*  --  97 94  94  BUN 20  --  20 21  22   CREATININE 2.70*  --  2.84* 2.72*  2.74*  CALCIUM 11.2*  --  11.3* 11.2*  11.2*  MG 2.4  --   --  2.6*  PHOS 4.0  --  4.4 4.1   < > = values in this interval not displayed.   Liver Function Tests Recent Labs    04/20/19 1605 04/21/19 0423  ALBUMIN 2.3* 2.3*   No results for input(s): LIPASE, AMYLASE in the last 72 hours. Cardiac Enzymes No results for input(s): CKTOTAL, CKMB, CKMBINDEX, TROPONINI in the last 72 hours.  BNP: BNP (last 3 results) Recent Labs    08/25/18 1746 01/30/19 0819 02/05/19 0951  BNP 513.0* 1,138.2* 1,186.2*    ProBNP (last 3 results) No results for input(s): PROBNP in the last 8760 hours.  D-Dimer No results for input(s): DDIMER in the last 72 hours. Hemoglobin A1C No results for input(s): HGBA1C in the last 72 hours. Fasting Lipid Panel No results for input(s): CHOL, HDL, LDLCALC, TRIG, CHOLHDL, LDLDIRECT in the last 72 hours. Thyroid Function Tests No results for input(s): TSH, T4TOTAL, T3FREE, THYROIDAB in the last 72 hours.  Invalid input(s): FREET3  Other results:   Imaging    Dg Chest Port 1 View  Result Date: 04/20/2019 CLINICAL DATA:  Status post left subclavian central line placement today. EXAM: PORTABLE CHEST 1 VIEW COMPARISON:  Single-view of the chest 04/18/2019. FINDINGS: New left subclavian central venous catheter is in place with its tip projecting in the mid to upper superior vena cava. Impella device is no longer seen. Likely Swan-Ganz catheter from an inferior approach is looped in the right ventricle with its tip projecting retrograde. Mediastinal drain and left chest tube remain in place. There is no pneumothorax. Cardiomegaly and mild interstitial  edema are seen. There is basilar atelectasis. The patient is status post CABG. IMPRESSION: Left subclavian catheter tip projects in the mid to upper superior vena cava. No pneumothorax. Likely Swan-Ganz catheter from an inferior approach now appears looped with its tip projecting retrograde in the right ventricle. Interstitial pulmonary edema and bibasilar atelectasis. These results were called by telephone at the time of interpretation on 04/20/2019 at 11:15 am to the patient's nurse, Thayer Headings, who verbally acknowledged these results. Electronically Signed   By: Inge Rise M.D.   On: 04/20/2019 11:16     Medications:     Scheduled Medications: . sodium chloride   Intravenous Once  . acetaminophen  1,000 mg Oral Q6H   Or  . acetaminophen (TYLENOL) oral liquid 160 mg/5 mL  1,000 mg Per Tube Q6H  . acetaminophen (TYLENOL) oral liquid 160 mg/5 mL  650 mg Per Tube Once   Or  . acetaminophen  650 mg Rectal Once  . aspirin EC  325 mg Oral Daily   Or  . aspirin  324 mg Per Tube Daily  . B-complex with vitamin C  1 tablet Oral Daily  . bisacodyl  10 mg Oral Daily   Or  . bisacodyl  10 mg Rectal Daily  . Chlorhexidine Gluconate Cloth  6 each Topical Daily  . docusate sodium  200 mg Oral Daily  . febuxostat  40 mg Oral QODAY  . feeding supplement  1 Container Oral TID BM  . feeding supplement (PRO-STAT SUGAR FREE 64)  30 mL Oral TID BM  . fluticasone  2 spray Each Nare Daily  . insulin aspart  0-24 Units Subcutaneous Q4H  . mouth rinse  15 mL Mouth Rinse BID  . pantoprazole  40 mg Oral Daily  . rosuvastatin  5 mg Oral q1800  . sodium chloride flush  10-40 mL Intracatheter Q12H  . sodium chloride flush  3 mL Intravenous Q12H    Infusions: .  prismasol BGK 4/2.5 500 mL/hr at 04/21/19 [id number]  .  prismasol BGK 4/2.5 300 mL/hr at 04/21/19 0033  . sodium chloride Stopped (04/20/19 1230)  . sodium chloride    . sodium chloride Stopped (04/19/19 2000)  . sodium chloride    . amiodarone 30  mg/hr (04/21/19 0700)  . dexmedetomidine (PRECEDEX) IV infusion Stopped (04/17/19 0316)  . dextrose 5 % Impella 5.0 Purge solution Stopped (04/16/19 2026)  . DOBUTamine 2.5 mcg/kg/min (04/21/19 0700)  . DOPamine    . epinephrine Stopped (04/19/19 0019)  . heparin 1,650 Units/hr (04/21/19  0700)  . lactated ringers    . lactated ringers    . lactated ringers 10 mL/hr at 04/21/19 0700  . norepinephrine (LEVOPHED) Adult infusion 8 mcg/min (04/21/19 0700)  . piperacillin-tazobactam 3.375 g (04/21/19 0606)  . prismasol BGK 4/2.5 1,500 mL/hr at 04/21/19 0652    PRN Medications: sodium chloride, Place/Maintain arterial line **AND** sodium chloride, lactated ringers, menthol-cetylpyridinium, metoprolol tartrate, midazolam, morphine injection, ondansetron (ZOFRAN) IV, oxyCODONE, sodium chloride flush, sodium chloride flush, traMADol   Assessment/Plan   1. Shock: Suspect cardiogenic shock in setting of atypical atrial flutter with RVR and ischemia from severe left main stenosis. May have developed a bit of sepsis component on 9/25.  Echo 9/25 EF 30%.  Stable MAP this morning.  -  Now on NE 8, dobutamine 2.5. CO-OX pending. Adding midodrine per Dr Kipp Brood.    2. CAD: Concern for acute anterior MI and patient has occluded mid LAD.  However, suspect this was CTO.  Dr. Angelena Form did angioplasty but unable to restore flow.  Patient has a 90% distal left main stenosis compromising flow in LCx and large diagonal.  Suspect this led to ischemia/chest pain when the patient went into rapid atrial flutter PTA  - Patient taken urgently for CABG supported by Impella. S/P CABG x3 on 9/24 - Statin and ASA  - Continue heparin gtt, eventually transition to Eliquis given atrial flutter.  - Hold b-blocker for now while on pressors/Impella  3. Acute on chronic systolic CHF: Echo with EF in 25% range with peri-apical severe hypokinesis.  Suspect ischemic cardiomyopathy.  Elevated filling pressures on RHC.   - In  NSR. On Amio drip 30 mg per hour.  - Echo EF 30% - Volume managed per CRRT. Add midodrine 2.5 mg three times a day.  - See above  4. Paroxysmal Atrial flutter/fib: He had atypical atrial flutter initially, post-op had atrial fibrillation.  - Continue amiodarone gtt, he has been in and out of afib.  Can decrease to 30 mg/hr.  - On heparin drip.  - Hgb 7.7  -  Eventual switch to Eliquis once CTs out.   5. ESRD: Recent start on HD.  Nephrology following. On CVVD.   6. ID - fevers resolved. WBC 32k-> 20->19.7 ->16.8k - He is on Zosyn.  - cx NGTD  7. Anemia: Hgb 7.7 .  No obvious source of bleeding. Abdomen nontender.   8. Thrombocytopenia - Plts 76>89>99K (down 110-120k). - HIT  Antibody negative.    Length of Stay: 5  Amy Clegg, NP  04/21/2019, 7:07 AM  Advanced Heart Failure Team Pager [phone] (M-F; 7a - 4p)  Please contact [facility] for night-coverage after hours (4p -7a ) and weekends on [email or web address]  Patient seen with NP, agree with the above note.    CVVH ongoing with UF 100 cc/hr, CVP 9-10.  Luiz Blare out yesterday.  Awake/alert, remains on dobutamine 2.5 + norepinephrine 8.  He is in NSR on amiodarone 30 mg/hr and remains on heparin gtt.   Now off vasopressin, remains on norepinephrine 8 and dobutamine 2.5.   General: NAD Neck: JVP 8-9 cm, no thyromegaly or thyroid nodule.  Lungs: decreased at bases. CV: Nonpalpable PMI.  Heart regular S1/S2, no S3/S4, no murmur.  No peripheral edema.   Abdomen: Soft, nontender, no hepatosplenomegaly, no distention.  Skin: Intact without lesions or rashes.  Neurologic: Alert and oriented x 3.  Psych: Normal affect. Extremities: No clubbing or cyanosis.  HEENT: Normal.   - Continue dobutamine 2.5, aim to  wean wean down norepinephrine today.  Start midodrine 2.5 tid.   - Continue UF via CVVH, 100 cc/hr.  CVP down to 8-9 cm.   He is in NSR, continue amiodarone to 30 mg/hr.  Continuing heparin gtt. Platelets slowly increasing  now, HIT negative. Eventually will transition to Eliquis.   Afebrile, WBCs trending down, remains on Zosyn.   Post-op anemia, hgb 7.7 today after 1 unit PRBCs yesterday.  CRITICAL CARE Performed by: Loralie Champagne  Total critical care time: 35 minutes  Critical care time was exclusive of separately billable procedures and treating other patients.  Critical care was necessary to treat or prevent imminent or life-threatening deterioration.  Critical care was time spent personally by me on the following activities: development of treatment plan with patient and/or surrogate as well as nursing, discussions with consultants, evaluation of patient's response to treatment, examination of patient, obtaining history from patient or surrogate, ordering and performing treatments and interventions, ordering and review of laboratory studies, ordering and review of radiographic studies, pulse oximetry and re-evaluation of patient's condition.  Loralie Champagne 04/21/2019 7:54 AM

## 2019-04-22 ENCOUNTER — Inpatient Hospital Stay (HOSPITAL_COMMUNITY): Payer: Medicare Other

## 2019-04-22 LAB — RENAL FUNCTION PANEL
Albumin: 2.2 g/dL — ABNORMAL LOW (ref 3.5–5.0)
Albumin: 2.3 g/dL — ABNORMAL LOW (ref 3.5–5.0)
Anion gap: 14 (ref 5–15)
Anion gap: 11 (ref 5–15)
BUN: 20 mg/dL (ref 8–23)
BUN: 21 mg/dL (ref 8–23)
CO2: 23 mmol/L (ref 22–32)
CO2: 25 mmol/L (ref 22–32)
Calcium: 11.2 mg/dL — ABNORMAL HIGH (ref 8.9–10.3)
Calcium: 11.3 mg/dL — ABNORMAL HIGH (ref 8.9–10.3)
Chloride: 100 mmol/L (ref 98–111)
Chloride: 100 mmol/L (ref 98–111)
Creatinine, Ser: 2.54 mg/dL — ABNORMAL HIGH (ref 0.61–1.24)
Creatinine, Ser: 2.44 mg/dL — ABNORMAL HIGH (ref 0.61–1.24)
GFR calc Af Amer: 29 mL/min — ABNORMAL LOW (ref >60)
GFR calc Af Amer: 30 mL/min — ABNORMAL LOW (ref >60)
GFR calc non Af Amer: 25 mL/min — ABNORMAL LOW (ref >60)
GFR calc non Af Amer: 26 mL/min — ABNORMAL LOW (ref >60)
Glucose, Bld: 87 mg/dL (ref 70–99)
Glucose, Bld: 108 mg/dL — ABNORMAL HIGH (ref 70–99)
Phosphorus: 3.1 mg/dL (ref 2.5–4.6)
Phosphorus: 3.2 mg/dL (ref 2.5–4.6)
Potassium: 3.8 mmol/L (ref 3.5–5.1)
Potassium: 4.1 mmol/L (ref 3.5–5.1)
Sodium: 137 mmol/L (ref 135–145)
Sodium: 136 mmol/L (ref 135–145)

## 2019-04-22 LAB — CBC
HCT: 23.7 % — ABNORMAL LOW (ref 39.0–52.0)
Hemoglobin: 7.5 g/dL — ABNORMAL LOW (ref 13.0–17.0)
MCH: 29.2 pg (ref 26.0–34.0)
MCHC: 31.6 g/dL (ref 30.0–36.0)
MCV: 92.2 fL (ref 80.0–100.0)
Platelets: 119 10*3/uL — ABNORMAL LOW (ref 150–400)
RBC: 2.57 MIL/uL — ABNORMAL LOW (ref 4.22–5.81)
RDW: 16.6 % — ABNORMAL HIGH (ref 11.5–15.5)
WBC: 13.3 10*3/uL — ABNORMAL HIGH (ref 4.0–10.5)
nRBC: 0.2 % (ref 0.0–0.2)

## 2019-04-22 LAB — GLUCOSE, CAPILLARY
Glucose-Capillary: 102 mg/dL — ABNORMAL HIGH (ref 70–99)
Glucose-Capillary: 107 mg/dL — ABNORMAL HIGH (ref 70–99)
Glucose-Capillary: 112 mg/dL — ABNORMAL HIGH (ref 70–99)
Glucose-Capillary: 82 mg/dL (ref 70–99)
Glucose-Capillary: 82 mg/dL (ref 70–99)
Glucose-Capillary: 85 mg/dL (ref 70–99)

## 2019-04-22 LAB — APTT: aPTT: 80 seconds — ABNORMAL HIGH (ref 24–36)

## 2019-04-22 LAB — COOXEMETRY PANEL
Carboxyhemoglobin: 2.2 % — ABNORMAL HIGH (ref 0.5–1.5)
Methemoglobin: 0.8 % (ref 0.0–1.5)
O2 Saturation: 83 %
Total hemoglobin: 7.4 g/dL — ABNORMAL LOW (ref 12.0–16.0)

## 2019-04-22 LAB — HEPARIN LEVEL (UNFRACTIONATED)
Heparin Unfractionated: 0.25 IU/mL — ABNORMAL LOW (ref 0.30–0.70)
Heparin Unfractionated: 0.27 IU/mL — ABNORMAL LOW (ref 0.30–0.70)

## 2019-04-22 LAB — MAGNESIUM: Magnesium: 2.6 mg/dL — ABNORMAL HIGH (ref 1.7–2.4)

## 2019-04-22 MED ORDER — HEPARIN SODIUM (PORCINE) 1000 UNIT/ML DIALYSIS
1000.0000 [IU] | INTRAMUSCULAR | Status: DC | PRN
  Administered 2019-04-24: 2400 [IU] via INTRAVENOUS_CENTRAL
  Filled 2019-04-22: qty 5
  Filled 2019-04-22 (×2): qty 6

## 2019-04-22 MED ORDER — SODIUM CHLORIDE 0.9 % IV SOLN
INTRAVENOUS | Status: DC | PRN
  Administered 2019-04-23: 12:00:00 250 mL via INTRAVENOUS
  Administered 2019-04-29: 500 mL via INTRAVENOUS

## 2019-04-22 MED ORDER — AMIODARONE IV BOLUS ONLY 150 MG/100ML
150.0000 mg | Freq: Once | INTRAVENOUS | Status: AC
  Administered 2019-04-22: 150 mg via INTRAVENOUS
  Filled 2019-04-22: qty 100

## 2019-04-22 MED ORDER — MIDODRINE HCL 5 MG PO TABS
5.0000 mg | ORAL_TABLET | Freq: Three times a day (TID) | ORAL | Status: DC
  Administered 2019-04-22 – 2019-04-23 (×4): 5 mg via ORAL
  Filled 2019-04-22 (×4): qty 1

## 2019-04-22 NOTE — Progress Notes (Signed)
      [address]       [place] [address]             [phone]      POD # 6 CABG x 3  Sleeping currently  BP (!) 84/37   Pulse 87   Temp 97.8 F (36.6 C) (Oral)   Resp 16   Ht 6' (1.829 m)   Wt 125.8 kg   SpO2 91%   BMI 37.61 kg/m   6l  94% sat  Intake/Output Summary (Last 24 hours) at 04/22/2019 1834 Last data filed at 04/22/2019 1800 Gross per 24 hour  Intake 2318.26 ml  Output 4584 ml  Net -2265.74 ml   On CVVH Dobutamine at 1.5 and norepi at 2 mcg/min  Continue current Rx  Steven C. Roxan Hockey, MD [facility] [phone]

## 2019-04-22 NOTE — Progress Notes (Signed)
ANTICOAGULATION CONSULT NOTE - Follow Up Consult  Pharmacy Consult for Heparin  Indication: atrial fibrillation  Allergies  Allergen Reactions  . Codeine Phosphate Other (See Comments)    Hyperactive     Patient Measurements: Height: 6' (182.9 cm) Weight: 277 lb 5.4 oz (125.8 kg) IBW/kg (Calculated) : 77.6  Heparin dosing weight: 107.5  Vital Signs: Temp: 98.1 F (36.7 C) (09/30 0400) Temp Source: Oral (09/30 0400) BP: 105/65 (09/30 0600) Pulse Rate: 91 (09/30 0615)  Labs: Recent Labs    04/20/19 0422 04/20/19 0515  04/21/19 0423 04/21/19 0732 04/21/19 1544 04/21/19 1645 04/22/19 0535  HGB 7.2* 7.5*  --  7.7*  --   --   --  7.5*  HCT 22.6* 22.0*  --  23.6*  --   --   --  23.7*  PLT 89*  --   --  99*  --   --   --  119*  APTT 42*  --   --   --  58*  --   --  80*  HEPARINUNFRC <0.10*  --    < >  --  0.14* 0.18* 0.15* 0.25*  CREATININE 2.70*  --    < > 2.72*  2.74*  --  2.60*  --  2.54*   < > = values in this interval not displayed.    Estimated Creatinine Clearance: 37.6 mL/min (A) (by C-G formula based on SCr of 2.54 mg/dL (H)).   Medical History: Past Medical History:  Diagnosis Date  . Cervical disc disease   . Chronic kidney disease   . COLONIC POLYPS, HX OF 06/27/2007  . EXOGENOUS OBESITY 01/30/2010  . GERD (gastroesophageal reflux disease)    PMH  . GOUT 01/30/2010  . HYPERCHOLESTEROLEMIA 06/30/2007  . HYPERTENSION 06/27/2007  . LOW BACK PAIN 06/27/2007  . NEPHROLITHIASIS, HX OF 06/27/2007  . OSTEOARTHRITIS 06/27/2007  . SLEEP APNEA, OBSTRUCTIVE, MODERATE 01/27/2009  . Wears dentures    upper    Assessment: 68yom admitted for STEMI with LM disease > Impella CP placed in cath lab and then OR for CABG. Impella pulled and heparin resumed for A-flutter/A-fib.  Heparin level  subtherapeutic at 0.15 on heparin rate of 1900 units/hr.  Running through central line and heparin level drawn peripherally.   No signs/symptoms of bleeding. Little CT oupt. CBC is low  with Hg at 7.7. Plts are low at 99 but improving. Both are low likely due to recent CABG and impella. Of note, patient's HIT antibody was negative  9/30 AM update:  Heparin level just below goal, no issues per RN, Hgb low but stable for several days  Goal of Therapy:  Heparin level 0.3-0.5 with recent Impella pull Monitor platelets by anticoagulation protocol: Yes   Plan:  Inc heparin to 2200 units//hr Re-check heparin level at [place], PharmD, [place] Pharmacist Phone: [phone]

## 2019-04-22 NOTE — Progress Notes (Signed)
[facility] Progress Note  Subjective: stable, no new c/o.  Dobutamine down to 1.5 u/k/min and planning to wean off levo today, midodrine ^'d  5 tid.    Vitals:   04/22/19 1100 04/22/19 1115 04/22/19 1130 04/22/19 1145  BP: (!) 105/59 115/65 102/63   Pulse: 96 100 92   Resp: 16 15 14    Temp:    97.6 F (36.4 C)  TempSrc:    Oral  SpO2: 97% 95% 94%   Weight:      Height:        Inpatient medications: . sodium chloride   Intravenous Once  . acetaminophen (TYLENOL) oral liquid 160 mg/5 mL  650 mg Per Tube Once   Or  . acetaminophen  650 mg Rectal Once  . aspirin EC  325 mg Oral Daily   Or  . aspirin  324 mg Per Tube Daily  . B-complex with vitamin C  1 tablet Oral Daily  . bisacodyl  10 mg Oral Daily   Or  . bisacodyl  10 mg Rectal Daily  . Chlorhexidine Gluconate Cloth  6 each Topical Daily  . febuxostat  40 mg Oral QODAY  . fluticasone  2 spray Each Nare Daily  . insulin aspart  0-24 Units Subcutaneous Q4H  . mouth rinse  15 mL Mouth Rinse BID  . midodrine  5 mg Oral TID WC  . pantoprazole  40 mg Oral Daily  . rosuvastatin  5 mg Oral q1800  . sodium chloride flush  10-40 mL Intracatheter Q12H  . sodium chloride flush  3 mL Intravenous Q12H   .  prismasol BGK 4/2.5 500 mL/hr at 04/22/19 0215  .  prismasol BGK 4/2.5 300 mL/hr at 04/22/19 0913  . sodium chloride 10 mL/hr at 04/21/19 2209  . sodium chloride 10 mL/hr at 04/22/19 1100  . amiodarone 30 mg/hr (04/22/19 1100)  . DOBUTamine 1.5 mcg/kg/min (04/22/19 1100)  . epinephrine Stopped (04/19/19 0019)  . heparin 2,200 Units/hr (04/22/19 1100)  . lactated ringers    . lactated ringers    . lactated ringers Stopped (04/22/19 0900)  . norepinephrine (LEVOPHED) Adult infusion 6 mcg/min (04/22/19 1100)  . piperacillin-tazobactam 3.375 g (04/22/19 1103)  . prismasol BGK 4/2.5 1,500 mL/hr at 04/22/19 1201   Place/Maintain arterial line **AND** sodium chloride, sodium chloride, lactated ringers,  menthol-cetylpyridinium, metoprolol tartrate, morphine injection, ondansetron (ZOFRAN) IV, oxyCODONE, sodium chloride flush, sodium chloride flush, traMADol    Exam: General:NAD, comfortable Heart:RRR, s1s2 nl Lungs: clear with decreased breath sound basally, no wheezing Abdomen:soft, Non-tender, non-distended Extremities: no sig lower extremity edema  Dialysis Access: RUE AVF + bruit    Dialysis:  [facility] MWF  - new start, had OP HD on 9/21 and 9/23 prior to admission  3h  132kg  300/600  RUE AVF  2/2 bath  Hep none   venofer 100 mg ordered each treatment x 4, Hb 8.5 on 9/21  no ESA order yet  PTH 1366 on 04/15/19   Assessment/ Plan #Cardiogenic shock/STEMI: Status post emergent CABG on 9/24.  SP Impella, removed Sunday. As per heart failure team and CT surgery.  Weaning dobutamine down, levo to come off today if possible. Midodrine ^'d 5 tid.   # ESRD: new start had 1st / 2nd HD last week prior to admission. Had post -op cardiogenic shock and started CRRT here on 9/25.  On systemic heparin gtt. Tolerating 100 cc/hr UF, will continue. Approaching euvolemia. Will plan to transition to regular HD when  off of drips.   # Anemia: Due to ABLA and CKD: Transfuse blood prn  # Secondary hyperparathyroidism: hypercalcemic, ? Immobilization. Vit D has been on hold here.  PTH 1366 on 9/23 and OP Ca++ 11.3 9/23  - cont to follow, low Ca+ bath when on regular HD   Rob Thelma Lorenzetti 04/22/2019, 12:02 PM  Iron/TIBC/Ferritin/ %Sat    Component Value Date/Time   IRON 137 04/16/2019 2343   IRON 32 (L) 01/26/2019 1100   TIBC 188 (L) 04/16/2019 2343   TIBC 226 (L) 01/26/2019 1100   FERRITIN 310 04/16/2019 2343   FERRITIN 301 01/26/2019 1100   IRONPCTSAT 73 (H) 04/16/2019 2343   IRONPCTSAT 14 (L) 01/26/2019 1100   Recent Labs  Lab 04/16/19 1744  04/22/19 0535  NA  --    < > 137  K  --    < > 3.8  CL  --    < > 100  CO2  --    < > 23  GLUCOSE  --    < > 87  BUN  --    < > 20  CREATININE   --    < > 2.54*  CALCIUM  --    < > 11.2*  PHOS  --    < > 3.1  ALBUMIN  --    < > 2.2*  INR 1.5*  --   --    < > = values in this interval not displayed.   No results for input(s): AST, ALT, ALKPHOS, BILITOT, PROT in the last 168 hours. Recent Labs  Lab 04/22/19 0535  WBC 13.3*  HGB 7.5*  HCT 23.7*  PLT 119*

## 2019-04-22 NOTE — Progress Notes (Addendum)
Nutrition Follow-up  DOCUMENTATION CODES:   Morbid obesity  INTERVENTION:   -D/C Boost Breeze + Prostat (pt preference)    Magic cup TID with meals, each supplement provides 290 kcal and 9 grams of protein  Continue b complex with Vitamin C  NUTRITION DIAGNOSIS:   Increased nutrient needs related to chronic illness as evidenced by estimated needs.  Ongoing   GOAL:   Patient will meet greater than or equal to 90% of their needs  Progressing  MONITOR:   PO intake, Supplement acceptance, Diet advancement, Labs, Weight trends, I & O's, Skin  REASON FOR ASSESSMENT:   Rounds    ASSESSMENT:   68 year old male who presented to the ED on 9/24 with chest pain. PMH of HTN, ESRD recently started on HD, GERD, gout, HLD. Code STEMI called by ED. Pt found to have CHF.   9/24 - s/p LHC and RHC, Impella placement, urgent CABG x 3 9/25 - CRRT initiated, diet advanced to clear liquids 9/27- impella removed   Pt discussed during ICU rounds and with RN.   Pressors being weaned. Remains on CRRT. Plan to transition to HD once off drips. Diet advanced to heart healthy yesterday. Had BM this am. Intake progressing- meal completions charted as 30-75% for pt's last four meals. Pt does not like Prostat or Boost. Does not wish to have Ensure. Willing to try [employer].   EDW: 132 kg Admission weight: 128.8 kg  Current weight: 125.8 kg    I/O: -310 ml since admit UOP: 10 ml x 24 hrs  JP drain: 3 ml x 24 hrs Chest tube: 70 ml x 24 hrs  CRRT: 4,508 ml x 24 hrs   Drips: amiodarone, dobutrex, levophed Medications: b complex with Vit, dulcolax, SS novolog Labs: Mg 2.6 (H) CBG 82-107  Diet Order:   Diet Order            Diet Heart Room service appropriate? Yes; Fluid consistency: Thin  Diet effective now              EDUCATION NEEDS:   Education needs have been addressed  Skin:  Skin Assessment: Skin Integrity Issues: Skin Integrity Issues:: Incisions Incisions: left chest,  right leg, right arm  Last BM:  9/30  Height:   Ht Readings from Last 1 Encounters:  04/20/19 6' (1.829 m)    Weight:   Wt Readings from Last 1 Encounters:  04/22/19 125.8 kg    Ideal Body Weight:  80.9 kg  BMI:  Body mass index is 37.61 kg/m.  Estimated Nutritional Needs:   Kcal:  2400-2600  Protein:  140-160 grams  Fluid:  1000 ml + UOP   Mariana Single RD, LDN Clinical Nutrition Pager # [phone]

## 2019-04-22 NOTE — Progress Notes (Signed)
ANTICOAGULATION CONSULT NOTE - Follow Up Consult  Pharmacy Consult for Heparin  Indication: atrial fibrillation  Allergies  Allergen Reactions  . Codeine Phosphate Other (See Comments)    Hyperactive     Patient Measurements: Height: 6' (182.9 cm) Weight: 277 lb 5.4 oz (125.8 kg) IBW/kg (Calculated) : 77.6  Heparin dosing weight: 107.5  Vital Signs: Temp: 97.8 F (36.6 C) (09/30 1545) Temp Source: Oral (09/30 1545) BP: 112/92 (09/30 1615) Pulse Rate: 87 (09/30 1615)  Labs: Recent Labs    04/20/19 0422 04/20/19 0515  04/21/19 0423 04/21/19 0732 04/21/19 1544 04/21/19 1645 04/22/19 0535 04/22/19 1534  HGB 7.2* 7.5*  --  7.7*  --   --   --  7.5*  --   HCT 22.6* 22.0*  --  23.6*  --   --   --  23.7*  --   PLT 89*  --   --  99*  --   --   --  119*  --   APTT 42*  --   --   --  58*  --   --  80*  --   HEPARINUNFRC <0.10*  --    < >  --  0.14* 0.18* 0.15* 0.25* 0.27*  CREATININE 2.70*  --    < > 2.72*  2.74*  --  2.60*  --  2.54* 2.44*   < > = values in this interval not displayed.    Estimated Creatinine Clearance: 39.2 mL/min (A) (by C-G formula based on SCr of 2.44 mg/dL (H)).  Assessment: 68yom admitted for STEMI with LM disease > Impella CP placed in cath lab and then OR for CABG. Impella pulled and heparin resumed for A-flutter/A-fib.  Hep lvl this afternoon slightly low at 0.27   Goal of Therapy:  Heparin level 0.3-0.5 with recent Impella pull Monitor platelets by anticoagulation protocol: Yes   Plan:  Inc heparin to 2300 units/hr 0000 HL  Levester Fresh, PharmD, BCPS, BCCCP Clinical Pharmacist [phone]  Please check AMION for all [place] numbers  04/22/2019 4:30 PM

## 2019-04-22 NOTE — Progress Notes (Addendum)
TCTS DAILY ICU PROGRESS NOTE                   [place].[address]            [employer] [id number]          [phone]   6 Days Post-Op Procedure(s) (LRB): CORONARY ARTERY BYPASS GRAFTING (CABG)X3  , WITH ENDOSCOPIC HARVESTING OF RIGHT GREATER SAPHENOUS VEIN (N/A) TRANSESOPHAGEAL ECHOCARDIOGRAM (TEE) (N/A)  Total Length of Stay:  LOS: 6 days   Subjective: Patient on CRRT this am. He had a large bowel movement this am.  Objective: Vital signs in last 24 hours: Temp:  [97.3 F (36.3 C)-98.4 F (36.9 C)] 98.1 F (36.7 C) (09/30 0400) Pulse Rate:  [74-129] 129 (09/30 0745) Cardiac Rhythm: Normal sinus rhythm;Heart block (09/30 0600) Resp:  [10-33] 19 (09/30 0745) BP: (81-119)/(45-71) 81/56 (09/30 0745) SpO2:  [87 %-100 %] 92 % (09/30 0730) Weight:  [125.8 kg] 125.8 kg (09/30 0500)  Filed Weights   04/20/19 1300 04/21/19 0500 04/22/19 0500  Weight: 132 kg 130.6 kg 125.8 kg    Weight change: -6.2 kg   Hemodynamic parameters for last 24 hours: CVP:  [2 mmHg-15 mmHg] 8 mmHg  Intake/Output from previous day: 09/29 0701 - 09/30 0700 In: 2325 [P.O.:720; I.V.:1405; IV Piggyback:200] Out: [id number] [Urine:10; Drains:3; Chest Tube:70]  Intake/Output this shift: No intake/output data recorded.  Current Meds: Scheduled Meds: . sodium chloride   Intravenous Once  . acetaminophen (TYLENOL) oral liquid 160 mg/5 mL  650 mg Per Tube Once   Or  . acetaminophen  650 mg Rectal Once  . aspirin EC  325 mg Oral Daily   Or  . aspirin  324 mg Per Tube Daily  . B-complex with vitamin C  1 tablet Oral Daily  . bisacodyl  10 mg Oral Daily   Or  . bisacodyl  10 mg Rectal Daily  . Chlorhexidine Gluconate Cloth  6 each Topical Daily  . docusate sodium  200 mg Oral Daily  . febuxostat  40 mg Oral QODAY  . feeding supplement  1 Container Oral TID BM  . feeding supplement (PRO-STAT SUGAR FREE 64)  30 mL Oral TID BM  . fluticasone  2 spray Each Nare Daily  . insulin aspart  0-24 Units  Subcutaneous Q4H  . mouth rinse  15 mL Mouth Rinse BID  . midodrine  5 mg Oral TID WC  . pantoprazole  40 mg Oral Daily  . rosuvastatin  5 mg Oral q1800  . sodium chloride flush  10-40 mL Intracatheter Q12H  . sodium chloride flush  3 mL Intravenous Q12H   Continuous Infusions: .  prismasol BGK 4/2.5 500 mL/hr at 04/22/19 0215  .  prismasol BGK 4/2.5 300 mL/hr at 04/21/19 1619  . sodium chloride 10 mL/hr at 04/21/19 2209  . amiodarone 30 mg/hr (04/22/19 0700)  . amiodarone    . DOBUTamine 2.5 mcg/kg/min (04/22/19 0700)  . DOPamine    . epinephrine Stopped (04/19/19 0019)  . heparin 2,100 Units/hr (04/22/19 0700)  . lactated ringers    . lactated ringers    . lactated ringers 20 mL/hr at 04/22/19 0700  . norepinephrine (LEVOPHED) Adult infusion 2 mcg/min (04/22/19 0700)  . piperacillin-tazobactam Stopped (04/22/19 0640)  . prismasol BGK 4/2.5 1,500 mL/hr at 04/22/19 0541   PRN Meds:.Place/Maintain arterial line **AND** sodium chloride, lactated ringers, menthol-cetylpyridinium, metoprolol tartrate, morphine injection, ondansetron (ZOFRAN) IV, oxyCODONE, sodium chloride flush, sodium chloride flush, traMADol  General appearance: alert, cooperative and no distress Heart: IRRR IRRR Lungs: Slightly diminished bibsilar breath sounds Abdomen: Soft, obese, non tender, sporadic bowel sounds Extremities: Mild LE edema. Ecchymosis right thigh Wound: Sternal wound is clean and dry. RLE wounds are clean and dry JP drain: Sero sanguinous drainage  Lab Results: CBC: Recent Labs    04/21/19 0423 04/22/19 0535  WBC 16.8* 13.3*  HGB 7.7* 7.5*  HCT 23.6* 23.7*  PLT 99* 119*   BMET:  Recent Labs    04/21/19 1544 04/22/19 0535  NA 136 137  K 4.2 3.8  CL 101 100  CO2 26 23  GLUCOSE 95 87  BUN 21 20  CREATININE 2.60* 2.54*  CALCIUM 11.3* 11.2*    CMET: Lab Results  Component Value Date   WBC 13.3 (H) 04/22/2019   HGB 7.5 (L) 04/22/2019   HCT 23.7 (L) 04/22/2019   PLT 119  (L) 04/22/2019   GLUCOSE 87 04/22/2019   CHOL 201 (H) 04/16/2019   TRIG 178 (H) 04/16/2019   HDL 23 (L) 04/16/2019   LDLDIRECT 155.0 01/20/2009   LDLCALC 142 (H) 04/16/2019   ALT 8 02/05/2019   AST 10 (L) 02/05/2019   NA 137 04/22/2019   K 3.8 04/22/2019   CL 100 04/22/2019   CREATININE 2.54 (H) 04/22/2019   BUN 20 04/22/2019   CO2 23 04/22/2019   TSH 2.840 08/25/2018   PSA 1.28 03/01/2016   INR 1.5 (H) 04/16/2019   HGBA1C 5.5 04/17/2019     PT/INR: No results for input(s): LABPROT, INR in the last 72 hours. Radiology: No results found.   Assessment/Plan: S/P Procedure(s) (LRB): CORONARY ARTERY BYPASS GRAFTING (CABG)X3  , WITH ENDOSCOPIC HARVESTING OF RIGHT GREATER SAPHENOUS VEIN (N/A) TRANSESOPHAGEAL ECHOCARDIOGRAM (TEE) (N/A)   1. CV-S/p STEMI, cardiogenic shock.PAF-HR in the 120's this am. Will likely resume Apixaban once chest tube have been removed. On Amiodarone, Dobutamine, Neo Synephrine, Heparin drips. No BB at this time. Co ox this am up to 83 2. Pulmonary-on 2 liters of oxygen via [place]. Chest tubes with 70 cc last 24 hours.  Encourage incentive spirometer. 3. Acute on chronic systolic heart failure-likely related to ischemic cardiomyopathy 4. Anemia-H and H this am slightly decreased to 7.5 and 23.7 5. CBGs 85/82/82. Pre op HGA1C 5.5. Will likely stop accu checks and SS soon 6. Thrombocytopenia-platelets 119,000 7. ESRD-Creatinine decreased this am to 2.54. On CRRT. 8. Secondary hyperparathyroidism-per nephrology 9. ID-on Zosyn. WBC decreased to 19,700. No fever last 24 hours.  10. JP drain with scant output so will remove  Donielle Liston Alba PA-C 04/22/2019 8:14 AM    Doing well Only on 2 of levo.  Will increase midodrine, and wean levo On CRRT, tolerating fluid removal WBC continue to trend down  Alif Petrak O Sorren Vallier

## 2019-04-22 NOTE — Progress Notes (Addendum)
RN to patient's room due to CRRT alarming return pressure dropping and return disconnection.  All connections secure but noticed HD catheter had been pulled out some from insertion site.   Dressing changed and HD catheter secured via tape and dressing.  CRRT machine resumed proper function and alarms resolved.  Stat chest xray ordered to confirm placement.    MD Bensimhon placed HD catheter, paged MD Aundra Dubin to inform of HD catheter issue and to see if additional sutures could be placed to assist in securing HD catheter.  MD Aundra Dubin attending to another patient and unable to come bedside, requested RN page MD Lightfoot.  Paged MD Lightfoot, informed of HD catheter situation and MD Aundra Dubin being unable to assist at this time.  MD Lightfoot not available and advised to secure via tape and dressing and to monitor.    1400 - chest xray confirms HD catheter is in correct position.

## 2019-04-22 NOTE — Progress Notes (Signed)
Patient ID: Alex Williams, male   DOB: 1951/05/16, 68 y.o.   MRN: [id number]     Advanced Heart Failure Rounding Note  PCP-Cardiologist: No primary care provider on file.   Subjective:    9/24 S/P CABG x3. Impella in place.  9/27 Impella removed.   Remains on CRRT. On NE 2, dobutamine 2.5. CVP 13, co-ox 83%.  Now on midodrine 2.5 tid.   UF at 100 cc/hr, net I/O -2.3, weight now below baseline.   Hgb 7.7 => 7.5.    He went into atrial fibrillation last night, rate 90s.   Denies SOB, feels ok.    Objective:   Weight Range: 125.8 kg Body mass index is 37.61 kg/m.   Vital Signs:   Temp:  [97.3 F (36.3 C)-98.4 F (36.9 C)] 98.1 F (36.7 C) (09/30 0400) Pulse Rate:  [74-101] 101 (09/30 0715) Resp:  [10-33] 24 (09/30 0715) BP: (82-119)/(45-71) 95/59 (09/30 0715) SpO2:  [87 %-100 %] 96 % (09/30 0715) Weight:  [125.8 kg] 125.8 kg (09/30 0500) Last BM Date: 04/21/19  Weight change: Filed Weights   04/20/19 1300 04/21/19 0500 04/22/19 0500  Weight: 132 kg 130.6 kg 125.8 kg    Intake/Output:   Intake/Output Summary (Last 24 hours) at 04/22/2019 0737 Last data filed at 04/22/2019 0700 Gross per 24 hour  Intake 2325.01 ml  Output 4591 ml  Net -2265.99 ml      Physical Exam  CVP 13  General: NAD Neck: JVP 10 cm, no thyromegaly or thyroid nodule.  Lungs: Decreased at bases.  CV: Nondisplaced PMI.  Heart regular S1/S2, no S3/S4, no murmur.  No peripheral edema.   Abdomen: Soft, nontender, no hepatosplenomegaly, no distention.  Skin: Intact without lesions or rashes.  Neurologic: Alert and oriented x 3.  Psych: Normal affect. Extremities: No clubbing or cyanosis.  HEENT: Normal.    Telemetry   Atrial fibrillation 90s (personally reviewed)  EKG    N/A  Labs    CBC Recent Labs    04/19/19 1130 04/19/19 1548  04/21/19 0423 04/22/19 0535  WBC 20.1* 21.8*   < > 16.8* 13.3*  NEUTROABS 16.4* 17.4*  --   --   --   HGB 7.8* 7.7*   < > 7.7* 7.5*  HCT  23.1* 23.1*   < > 23.6* 23.7*  MCV 88.2 89.2   < > 90.1 92.2  PLT 76* 84*   < > 99* 119*   < > = values in this interval not displayed.   Basic Metabolic Panel Recent Labs    04/21/19 0423 04/21/19 1544 04/22/19 0535  NA 136  137 136 137  K 4.2  4.2 4.2 3.8  CL 102  101 101 100  CO2 25  25 26 23   GLUCOSE 94  94 95 87  BUN 21  22 21 20   CREATININE 2.72*  2.74* 2.60* 2.54*  CALCIUM 11.2*  11.2* 11.3* 11.2*  MG 2.6*  --  2.6*  PHOS 4.1 3.5 3.1   Liver Function Tests Recent Labs    04/21/19 1544 04/22/19 0535  ALBUMIN 2.3* 2.2*   No results for input(s): LIPASE, AMYLASE in the last 72 hours. Cardiac Enzymes No results for input(s): CKTOTAL, CKMB, CKMBINDEX, TROPONINI in the last 72 hours.  BNP: BNP (last 3 results) Recent Labs    08/25/18 1746 01/30/19 0819 02/05/19 0951  BNP 513.0* 1,138.2* 1,186.2*    ProBNP (last 3 results) No results for input(s): PROBNP in the last 8760 hours.  D-Dimer No results for input(s): DDIMER in the last 72 hours. Hemoglobin A1C No results for input(s): HGBA1C in the last 72 hours. Fasting Lipid Panel No results for input(s): CHOL, HDL, LDLCALC, TRIG, CHOLHDL, LDLDIRECT in the last 72 hours. Thyroid Function Tests No results for input(s): TSH, T4TOTAL, T3FREE, THYROIDAB in the last 72 hours.  Invalid input(s): FREET3  Other results:   Imaging    No results found.   Medications:     Scheduled Medications: . sodium chloride   Intravenous Once  . acetaminophen (TYLENOL) oral liquid 160 mg/5 mL  650 mg Per Tube Once   Or  . acetaminophen  650 mg Rectal Once  . aspirin EC  325 mg Oral Daily   Or  . aspirin  324 mg Per Tube Daily  . B-complex with vitamin C  1 tablet Oral Daily  . bisacodyl  10 mg Oral Daily   Or  . bisacodyl  10 mg Rectal Daily  . Chlorhexidine Gluconate Cloth  6 each Topical Daily  . docusate sodium  200 mg Oral Daily  . febuxostat  40 mg Oral QODAY  . feeding supplement  1 Container  Oral TID BM  . feeding supplement (PRO-STAT SUGAR FREE 64)  30 mL Oral TID BM  . fluticasone  2 spray Each Nare Daily  . insulin aspart  0-24 Units Subcutaneous Q4H  . mouth rinse  15 mL Mouth Rinse BID  . midodrine  5 mg Oral TID WC  . pantoprazole  40 mg Oral Daily  . rosuvastatin  5 mg Oral q1800  . sodium chloride flush  10-40 mL Intracatheter Q12H  . sodium chloride flush  3 mL Intravenous Q12H    Infusions: .  prismasol BGK 4/2.5 500 mL/hr at 04/22/19 0215  .  prismasol BGK 4/2.5 300 mL/hr at 04/21/19 1619  . sodium chloride 10 mL/hr at 04/21/19 2209  . amiodarone 30 mg/hr (04/22/19 0700)  . amiodarone    . DOBUTamine 2.5 mcg/kg/min (04/22/19 0700)  . DOPamine    . epinephrine Stopped (04/19/19 0019)  . heparin 2,100 Units/hr (04/22/19 0700)  . lactated ringers    . lactated ringers    . lactated ringers 20 mL/hr at 04/22/19 0700  . norepinephrine (LEVOPHED) Adult infusion 2 mcg/min (04/22/19 0700)  . piperacillin-tazobactam Stopped (04/22/19 0640)  . prismasol BGK 4/2.5 1,500 mL/hr at 04/22/19 0541    PRN Medications: Place/Maintain arterial line **AND** sodium chloride, lactated ringers, menthol-cetylpyridinium, metoprolol tartrate, morphine injection, ondansetron (ZOFRAN) IV, oxyCODONE, sodium chloride flush, sodium chloride flush, traMADol   Assessment/Plan   1. Shock: Suspect initial cardiogenic shock in setting of atypical atrial flutter with RVR and ischemia from severe left main stenosis. May have developed a bit of sepsis component on 9/25.  Echo 9/25 EF 30%.  Stable MAP this morning, remains on norepinephrine 3 and dobutamine 2.5.  -  Increase midodrine to 5 mg tid.  - Wean off norepinephrine hopefully today.  2. CAD: Initially suspected acute anterior MI with occluded mid LAD.  However, suspect this was CTO.  Dr. Angelena Form did angioplasty but unable to restore flow.  Patient had a 90% distal left main stenosis compromising flow in LCx and large diagonal. Suspect  this led to ischemia/chest pain when the patient went into rapid atrial flutter prior to admission.  Patient taken urgently for CABG supported by Impella. S/P CABG x3 on 9/24 (unable to graft LAD).  - Statin and ASA  - Continue heparin gtt, eventually transition to  Eliquis given atrial flutter.  - Hold b-blocker for now with shock.  3. Acute on chronic systolic CHF: Echo with EF in 30% range with peri-apical severe hypokinesis.  Suspect ischemic cardiomyopathy.  Elevated filling pressures on RHC.  CVP 13 today with co-ox 83%.  - Increase midodrine to 5 mg tid and try to wean off norepinephrine today as above.  - Decrease dobutamine to 1.5 mcg/kg/min, off soon.  - Would continue CVVH at current UF today, still some volume overload.  Hopefully off CVVH tomorrow and transition to HD. 4. Paroxysmal Atrial flutter/fib: He had atypical atrial flutter initially, post-op had atrial fibrillation.  Back in atrial fibrillation overnight.  - Continue amiodarone gtt today, bolus this morning with afib.   - On heparin drip.  - Eventual switch to Eliquis once CTs out.  5. ESRD: Recent start on HD.  Nephrology following. On CVVD, hopefully can stop tomorrow.  6. ID: Fevers resolved. WBC 32k-> 20->19.7 ->16.8 -> 13.3.  - He is on Zosyn.  - cx NGTD 7. Anemia: Hgb 7.7 => 7.5. No obvious source of bleeding. Abdomen nontender.  - Transfuse if drops further.  8. Thrombocytopenia: Recovering. HIT Antibody negative.    CRITICAL CARE Performed by: Loralie Champagne  Total critical care time: 35 minutes  Critical care time was exclusive of separately billable procedures and treating other patients.  Critical care was necessary to treat or prevent imminent or life-threatening deterioration.  Critical care was time spent personally by me on the following activities: development of treatment plan with patient and/or surrogate as well as nursing, discussions with consultants, evaluation of patient's response to treatment,  examination of patient, obtaining history from patient or surrogate, ordering and performing treatments and interventions, ordering and review of laboratory studies, ordering and review of radiographic studies, pulse oximetry and re-evaluation of patient's condition.   Length of Stay: 6  Loralie Champagne, MD  04/22/2019, 7:37 AM  Advanced Heart Failure Team Pager [phone] (M-F; 7a - 4p)  Please contact [facility] for night-coverage after hours (4p -7a ) and weekends on [email or web address]

## 2019-04-23 ENCOUNTER — Inpatient Hospital Stay (HOSPITAL_COMMUNITY): Payer: Medicare Other

## 2019-04-23 ENCOUNTER — Encounter (HOSPITAL_COMMUNITY): Payer: Self-pay | Admitting: Cardiovascular Disease

## 2019-04-23 LAB — RENAL FUNCTION PANEL
Albumin: 2.3 g/dL — ABNORMAL LOW (ref 3.5–5.0)
Albumin: 2.4 g/dL — ABNORMAL LOW (ref 3.5–5.0)
Anion gap: 9 (ref 5–15)
Anion gap: 9 (ref 5–15)
BUN: 19 mg/dL (ref 8–23)
BUN: 21 mg/dL (ref 8–23)
CO2: 25 mmol/L (ref 22–32)
CO2: 26 mmol/L (ref 22–32)
Calcium: 11.3 mg/dL — ABNORMAL HIGH (ref 8.9–10.3)
Calcium: 11.3 mg/dL — ABNORMAL HIGH (ref 8.9–10.3)
Chloride: 103 mmol/L (ref 98–111)
Chloride: 104 mmol/L (ref 98–111)
Creatinine, Ser: 2.51 mg/dL — ABNORMAL HIGH (ref 0.61–1.24)
Creatinine, Ser: 2.38 mg/dL — ABNORMAL HIGH (ref 0.61–1.24)
GFR calc Af Amer: 29 mL/min — ABNORMAL LOW (ref >60)
GFR calc Af Amer: 31 mL/min — ABNORMAL LOW (ref >60)
GFR calc non Af Amer: 25 mL/min — ABNORMAL LOW (ref >60)
GFR calc non Af Amer: 27 mL/min — ABNORMAL LOW (ref >60)
Glucose, Bld: 97 mg/dL (ref 70–99)
Glucose, Bld: 131 mg/dL — ABNORMAL HIGH (ref 70–99)
Phosphorus: 3 mg/dL (ref 2.5–4.6)
Phosphorus: 2.8 mg/dL (ref 2.5–4.6)
Potassium: 3.9 mmol/L (ref 3.5–5.1)
Potassium: 3.9 mmol/L (ref 3.5–5.1)
Sodium: 137 mmol/L (ref 135–145)
Sodium: 139 mmol/L (ref 135–145)

## 2019-04-23 LAB — CBC
HCT: 25.7 % — ABNORMAL LOW (ref 39.0–52.0)
Hemoglobin: 7.9 g/dL — ABNORMAL LOW (ref 13.0–17.0)
MCH: 28.7 pg (ref 26.0–34.0)
MCHC: 30.7 g/dL (ref 30.0–36.0)
MCV: 93.5 fL (ref 80.0–100.0)
Platelets: 170 10*3/uL (ref 150–400)
RBC: 2.75 MIL/uL — ABNORMAL LOW (ref 4.22–5.81)
RDW: 16.6 % — ABNORMAL HIGH (ref 11.5–15.5)
WBC: 13.4 10*3/uL — ABNORMAL HIGH (ref 4.0–10.5)
nRBC: 0.3 % — ABNORMAL HIGH (ref 0.0–0.2)

## 2019-04-23 LAB — APTT: aPTT: 91 seconds — ABNORMAL HIGH (ref 24–36)

## 2019-04-23 LAB — COOXEMETRY PANEL
Carboxyhemoglobin: 2.2 % — ABNORMAL HIGH (ref 0.5–1.5)
Carboxyhemoglobin: 2.2 % — ABNORMAL HIGH (ref 0.5–1.5)
Methemoglobin: 1.2 % (ref 0.0–1.5)
Methemoglobin: 0.7 % (ref 0.0–1.5)
O2 Saturation: 49.8 %
O2 Saturation: 66.4 %
Total hemoglobin: 9 g/dL — ABNORMAL LOW (ref 12.0–16.0)
Total hemoglobin: 8.1 g/dL — ABNORMAL LOW (ref 12.0–16.0)

## 2019-04-23 LAB — HEPARIN LEVEL (UNFRACTIONATED)
Heparin Unfractionated: 0.45 IU/mL (ref 0.30–0.70)
Heparin Unfractionated: 0.53 IU/mL (ref 0.30–0.70)

## 2019-04-23 LAB — MAGNESIUM: Magnesium: 2.7 mg/dL — ABNORMAL HIGH (ref 1.7–2.4)

## 2019-04-23 LAB — GLUCOSE, CAPILLARY
Glucose-Capillary: 89 mg/dL (ref 70–99)
Glucose-Capillary: 95 mg/dL (ref 70–99)

## 2019-04-23 MED ORDER — MIDODRINE HCL 5 MG PO TABS
10.0000 mg | ORAL_TABLET | Freq: Three times a day (TID) | ORAL | Status: DC
  Administered 2019-04-23 – 2019-04-25 (×6): 10 mg via ORAL
  Filled 2019-04-23 (×8): qty 2

## 2019-04-23 MED ORDER — APIXABAN 5 MG PO TABS
5.0000 mg | ORAL_TABLET | Freq: Two times a day (BID) | ORAL | Status: DC
  Administered 2019-04-23 – 2019-04-30 (×13): 5 mg via ORAL
  Filled 2019-04-23 (×13): qty 1

## 2019-04-23 MED ORDER — MIDODRINE HCL 5 MG PO TABS
5.0000 mg | ORAL_TABLET | Freq: Once | ORAL | Status: AC
  Administered 2019-04-23: 5 mg via ORAL

## 2019-04-23 NOTE — Discharge Instructions (Signed)
Discharge Instructions:  1. You may shower, please wash incisions daily with soap and water and keep dry.  If you wish to cover wounds with dressing you may do so but please keep clean and change daily.  No tub baths or swimming until incisions have completely healed.  If your incisions become red or develop any drainage please call our office at [phone]  2. No Driving until cleared by Dr. Lightfoot's office and you are no longer using narcotic pain medications  3. Monitor your weight daily.. Please use the same scale and weigh at same time... If you gain 5-10 lbs in 48 hours with associated lower extremity swelling, please contact our office at [phone]  4. Fever of 101.5 for at least 24 hours with no source, please contact our office at [phone]  5. Activity- up as tolerated, please walk at least 3 times per day.  Avoid strenuous activity, no lifting, pushing, or pulling with your arms over 8-10 lbs for a minimum of 6 weeks  6. If any questions or concerns arise, please do not hesitate to contact our office at [phone]  ___________________________________________________________________________________________________________________________  Information on my medicine - ELIQUIS (apixaban)  This medication education was reviewed with me or my healthcare representative as part of my discharge preparation.  Why was Eliquis prescribed for you? Eliquis was prescribed for you to reduce the risk of a blood clot forming that can cause a stroke if you have a medical condition called atrial fibrillation (a type of irregular heartbeat).  What do You need to know about Eliquis ? Take your Eliquis TWICE DAILY - one tablet in the morning and one tablet in the evening with or without food. If you have difficulty swallowing the tablet whole please discuss with your pharmacist how to take the medication safely.  Take Eliquis exactly as prescribed by your doctor and DO NOT stop  taking Eliquis without talking to the doctor who prescribed the medication.  Stopping may increase your risk of developing a stroke.  Refill your prescription before you run out.  After discharge, you should have regular check-up appointments with your healthcare provider that is prescribing your Eliquis.  In the future your dose may need to be changed if your kidney function or weight changes by a significant amount or as you get older.  What do you do if you miss a dose? If you miss a dose, take it as soon as you remember on the same day and resume taking twice daily.  Do not take more than one dose of ELIQUIS at the same time to make up a missed dose.  Important Safety Information A possible side effect of Eliquis is bleeding. You should call your healthcare provider right away if you experience any of the following: Bleeding from an injury or your nose that does not stop. Unusual colored urine (red or dark brown) or unusual colored stools (red or black). Unusual bruising for unknown reasons. A serious fall or if you hit your head (even if there is no bleeding).  Some medicines may interact with Eliquis and might increase your risk of bleeding or clotting while on Eliquis. To help avoid this, consult your healthcare provider or pharmacist prior to using any new prescription or non-prescription medications, including herbals, vitamins, non-steroidal anti-inflammatory drugs (NSAIDs) and supplements.  This website has more information on Eliquis (apixaban): http://www.eliquis.com/eliquis/home   

## 2019-04-23 NOTE — Progress Notes (Signed)
ANTICOAGULATION CONSULT NOTE - Follow Up Consult  Pharmacy Consult for Heparin  Indication: atrial fibrillation  Allergies  Allergen Reactions  . Codeine Phosphate Other (See Comments)    Hyperactive     Patient Measurements: Height: 6' (182.9 cm) Weight: 277 lb 5.4 oz (125.8 kg) IBW/kg (Calculated) : 77.6  Heparin dosing weight: 107.5  Vital Signs: Temp: 98.2 F (36.8 C) (10/01 0000) Temp Source: Oral (10/01 0000) BP: 83/56 (10/01 0130) Pulse Rate: 94 (10/01 0130)  Labs: Recent Labs    04/20/19 0422 04/20/19 0515  04/21/19 0423 04/21/19 0732 04/21/19 1544  04/22/19 0535 04/22/19 1534 04/23/19 0006  HGB 7.2* 7.5*  --  7.7*  --   --   --  7.5*  --   --   HCT 22.6* 22.0*  --  23.6*  --   --   --  23.7*  --   --   PLT 89*  --   --  99*  --   --   --  119*  --   --   APTT 42*  --   --   --  58*  --   --  80*  --   --   HEPARINUNFRC <0.10*  --    < >  --  0.14* 0.18*   < > 0.25* 0.27* 0.45  CREATININE 2.70*  --    < > 2.72*  2.74*  --  2.60*  --  2.54* 2.44*  --    < > = values in this interval not displayed.    Estimated Creatinine Clearance: 39.2 mL/min (A) (by C-G formula based on SCr of 2.44 mg/dL (H)).  Assessment: 68yom admitted for STEMI with LM disease > Impella CP placed in cath lab and then OR for CABG. Impella pulled and heparin resumed for A-flutter/A-fib.  Heparin level came back therapeutic at 0.45, on 2300 units/hr - heparin is running in HD pigtail and level was drawn from opposite central line. No issues or s/sx of bleeding per nursing.   Goal of Therapy:  Heparin level 0.3-0.5 with recent Impella pull Monitor platelets by anticoagulation protocol: Yes   Plan:  Continue heparin infusion at 2300 units/hr Confirm heparin level in 8 hours Monitor heparin level, CBC, and for s/sx of bleeding daily   Antonietta Jewel, PharmD, BCCCP Clinical Pharmacist  Phone: [phone]  Please check AMION for all [employer] phone numbers After 10:00 PM, call [place] [phone] 04/23/2019 1:33 AM

## 2019-04-23 NOTE — Progress Notes (Signed)
[facility] Progress Note  Subjective: stable, no new c/o.  Dobutamine down to 1.5 u/k/min, midodrine ^'d 10 tid and attempting to wean off levo today.   Vitals:   04/23/19 0915 04/23/19 0930 04/23/19 0945 04/23/19 1000  BP: (!) 81/49 (!) 80/63 (!) 70/54 95/64  Pulse: 89 (!) 103 97 89  Resp: 15 19 14 15   Temp:      TempSrc:      SpO2: 99% 90% 95% 99%  Weight:      Height:        Inpatient medications: . sodium chloride   Intravenous Once  . acetaminophen (TYLENOL) oral liquid 160 mg/5 mL  650 mg Per Tube Once   Or  . acetaminophen  650 mg Rectal Once  . aspirin EC  325 mg Oral Daily   Or  . aspirin  324 mg Per Tube Daily  . B-complex with vitamin C  1 tablet Oral Daily  . bisacodyl  10 mg Oral Daily   Or  . bisacodyl  10 mg Rectal Daily  . Chlorhexidine Gluconate Cloth  6 each Topical Daily  . febuxostat  40 mg Oral QODAY  . fluticasone  2 spray Each Nare Daily  . mouth rinse  15 mL Mouth Rinse BID  . midodrine  10 mg Oral TID WC  . pantoprazole  40 mg Oral Daily  . rosuvastatin  5 mg Oral q1800  . sodium chloride flush  10-40 mL Intracatheter Q12H  . sodium chloride flush  3 mL Intravenous Q12H   .  prismasol BGK 4/2.5 500 mL/hr at 04/23/19 0909  .  prismasol BGK 4/2.5 300 mL/hr at 04/23/19 0246  . sodium chloride 10 mL/hr at 04/21/19 2209  . sodium chloride 10 mL/hr at 04/23/19 1100  . amiodarone 30 mg/hr (04/23/19 1100)  . DOBUTamine 1.5 mcg/kg/min (04/23/19 1100)  . epinephrine Stopped (04/19/19 0019)  . heparin 2,300 Units/hr (04/23/19 1100)  . lactated ringers    . lactated ringers    . lactated ringers Stopped (04/22/19 0900)  . norepinephrine (LEVOPHED) Adult infusion 3 mcg/min (04/23/19 1100)  . piperacillin-tazobactam Stopped (04/23/19 0631)  . prismasol BGK 4/2.5 1,500 mL/hr at 04/23/19 0831   Place/Maintain arterial line **AND** sodium chloride, sodium chloride, heparin, lactated ringers, menthol-cetylpyridinium, metoprolol tartrate,  morphine injection, ondansetron (ZOFRAN) IV, oxyCODONE, sodium chloride flush, sodium chloride flush, traMADol    Exam: General:NAD, comfortable Heart:RRR, s1s2 nl Lungs: clear with decreased breath sound basally, no wheezing Abdomen:soft, Non-tender, non-distended Extremities: no sig lower extremity edema  Dialysis Access: RUE AVF + bruit    Dialysis:  [facility] MWF  - new start, had OP HD on 9/21 and 9/23 prior to admission  3h  132kg  300/600  RUE AVF  2/2 bath  Hep none   venofer 100 mg ordered each treatment x 4, Hb 8.5 on 9/21  no ESA order yet  PTH 1366 on 04/15/19   Assessment/ Plan #Cardiogenic shock/STEMI: Status post emergent CABG on 9/24.  SP Impella, removed Sunday. As per heart failure team and CT surgery.  - dobutamine at 1.5 u/k/min - midodrine ^'d 10 tid, trying to wean off levo   # ESRD: new start had 1st / 2nd HD last week prior to admission. Had post -op cardiogenic shock and started CRRT here on 9/25.  On systemic heparin gtt.  - will lower UF to 75 cc/hr - will plan to transition to regular HD when off drips  # Anemia: Due to ABLA and  CKD: Transfuse blood prn  # Secondary hyperparathyroidism: hypercalcemic prob immobilization. Vit D has been on hold here.  PTH 1366 on 9/23 and OP Ca++ 11.3 9/23  - cont to follow, low Ca+ bath when on regular HD  # CAD sp CABG x 3 on 9/24  # Afib - on amio drip and IV heparin  # ID - on empiric abx Zosyn, WBC declining   Rob Skyleigh Windle 04/23/2019, 11:09 AM  Iron/TIBC/Ferritin/ %Sat    Component Value Date/Time   IRON 137 04/16/2019 2343   IRON 32 (L) 01/26/2019 1100   TIBC 188 (L) 04/16/2019 2343   TIBC 226 (L) 01/26/2019 1100   FERRITIN 310 04/16/2019 2343   FERRITIN 301 01/26/2019 1100   IRONPCTSAT 73 (H) 04/16/2019 2343   IRONPCTSAT 14 (L) 01/26/2019 1100   Recent Labs  Lab 04/16/19 1744  04/23/19 0408  NA  --    < > 137  K  --    < > 3.9  CL  --    < > 103  CO2  --    < > 25  GLUCOSE  --    < > 97   BUN  --    < > 19  CREATININE  --    < > 2.51*  CALCIUM  --    < > 11.3*  PHOS  --    < > 3.0  ALBUMIN  --    < > 2.3*  INR 1.5*  --   --    < > = values in this interval not displayed.   No results for input(s): AST, ALT, ALKPHOS, BILITOT, PROT in the last 168 hours. Recent Labs  Lab 04/23/19 0851  WBC 13.4*  HGB 7.9*  HCT 25.7*  PLT 170

## 2019-04-23 NOTE — Progress Notes (Signed)
[facility] BRIEF SICU PROGRESS NOTE  7 Days Post-Op  S/P Procedure(s) (LRB): CORONARY ARTERY BYPASS GRAFTING (CABG)X3  , WITH ENDOSCOPIC HARVESTING OF RIGHT GREATER SAPHENOUS VEIN (N/A) TRANSESOPHAGEAL ECHOCARDIOGRAM (TEE) (N/A)   Stable day  Plan: Continue current plan  Rexene Alberts, MD 04/23/2019 7:34 PM

## 2019-04-23 NOTE — Progress Notes (Signed)
Patient ID: Alex Williams, male   DOB: 05-Apr-1951, 68 y.o.   MRN: [id number]     Advanced Heart Failure Rounding Note  PCP-Cardiologist: No primary care provider on file.   Subjective:    9/24 S/P CABG x3. Impella in place.  9/27 Impella removed.   Remains on CRRT. On NE 3, dobutamine 1.5. CVP 9, co-ox lower this morning at 50%.  Now on midodrine 5 tid.   UF at 100 cc/hr, net I/O -2.3, weight now below baseline.   Hgb 7.7 => 7.5 => pending.    He has been in and out of atrial fibrillation, just back in atrial fibrillation in 90s this morning.   Denies SOB, feels ok.    Objective:   Weight Range: 125.8 kg Body mass index is 37.61 kg/m.   Vital Signs:   Temp:  [97.4 F (36.3 C)-98.2 F (36.8 C)] 97.4 F (36.3 C) (10/01 0400) Pulse Rate:  [81-115] 105 (10/01 0700) Resp:  [12-32] 32 (10/01 0700) BP: (67-124)/(37-96) 96/55 (10/01 0700) SpO2:  [78 %-100 %] 94 % (10/01 0700) Weight:  [125.8 kg] 125.8 kg (10/01 0600) Last BM Date: 04/23/19  Weight change: Filed Weights   04/21/19 0500 04/22/19 0500 04/23/19 0600  Weight: 130.6 kg 125.8 kg 125.8 kg    Intake/Output:   Intake/Output Summary (Last 24 hours) at 04/23/2019 0804 Last data filed at 04/23/2019 0700 Gross per 24 hour  Intake 1917.24 ml  Output 4250 ml  Net -2332.76 ml      Physical Exam   CVP 9  General: NAD Neck: JVP 8-9 cm, no thyromegaly or thyroid nodule.  Lungs: Decreased at bases.  CV: Nondisplaced PMI.  Heart irregular S1/S2, no S3/S4, no murmur.  No edema.   Abdomen: Soft, nontender, no hepatosplenomegaly, no distention.  Skin: Intact without lesions or rashes.  Neurologic: Alert and oriented x 3.  Psych: Normal affect. Extremities: No clubbing or cyanosis.  HEENT: Normal.    Telemetry   Atrial fibrillation 90s (personally reviewed)  EKG    N/A  Labs    CBC Recent Labs    04/21/19 0423 04/22/19 0535  WBC 16.8* 13.3*  HGB 7.7* 7.5*  HCT 23.6* 23.7*  MCV 90.1 92.2  PLT 99*  123456*   Basic Metabolic Panel Recent Labs    04/22/19 0535 04/22/19 1534 04/23/19 0408  NA 137 136 137  K 3.8 4.1 3.9  CL 100 100 103  CO2 23 25 25   GLUCOSE 87 108* 97  BUN 20 21 19   CREATININE 2.54* 2.44* 2.51*  CALCIUM 11.2* 11.3* 11.3*  MG 2.6*  --  2.7*  PHOS 3.1 3.2 3.0   Liver Function Tests Recent Labs    04/22/19 1534 04/23/19 0408  ALBUMIN 2.3* 2.3*   No results for input(s): LIPASE, AMYLASE in the last 72 hours. Cardiac Enzymes No results for input(s): CKTOTAL, CKMB, CKMBINDEX, TROPONINI in the last 72 hours.  BNP: BNP (last 3 results) Recent Labs    08/25/18 1746 01/30/19 0819 02/05/19 0951  BNP 513.0* 1,138.2* 1,186.2*    ProBNP (last 3 results) No results for input(s): PROBNP in the last 8760 hours.   D-Dimer No results for input(s): DDIMER in the last 72 hours. Hemoglobin A1C No results for input(s): HGBA1C in the last 72 hours. Fasting Lipid Panel No results for input(s): CHOL, HDL, LDLCALC, TRIG, CHOLHDL, LDLDIRECT in the last 72 hours. Thyroid Function Tests No results for input(s): TSH, T4TOTAL, T3FREE, THYROIDAB in the last 72 hours.  Invalid  input(s): FREET3  Other results:   Imaging    Dg Chest Port 1 View  Result Date: 04/22/2019 CLINICAL DATA:  Central line retracted. EXAM: PORTABLE CHEST 1 VIEW COMPARISON:  04/20/2019 FINDINGS: Stable enlarged cardiac silhouette and post CABG changes. The right jugular porta catheter tip is unchanged in the region of the superior cavoatrial junction. The left subclavian catheter is unchanged with its tip in the proximal superior vena cava. The previously demonstrated possible femoral Swan-Ganz catheter or overlying wire/tubing is no longer demonstrated. Mild bibasilar atelectasis with improvement. No acute bony abnormality. IMPRESSION: 1. No acute abnormality. 2. Mild bibasilar atelectasis with improvement. 3. Stable cardiomegaly. Electronically Signed   By: Claudie Revering M.D.   On: 04/22/2019 13:44      Medications:     Scheduled Medications:  sodium chloride   Intravenous Once   acetaminophen (TYLENOL) oral liquid 160 mg/5 mL  650 mg Per Tube Once   Or   acetaminophen  650 mg Rectal Once   aspirin EC  325 mg Oral Daily   Or   aspirin  324 mg Per Tube Daily   B-complex with vitamin C  1 tablet Oral Daily   bisacodyl  10 mg Oral Daily   Or   bisacodyl  10 mg Rectal Daily   Chlorhexidine Gluconate Cloth  6 each Topical Daily   febuxostat  40 mg Oral QODAY   fluticasone  2 spray Each Nare Daily   insulin aspart  0-24 Units Subcutaneous Q4H   mouth rinse  15 mL Mouth Rinse BID   midodrine  10 mg Oral TID WC   pantoprazole  40 mg Oral Daily   rosuvastatin  5 mg Oral q1800   sodium chloride flush  10-40 mL Intracatheter Q12H   sodium chloride flush  3 mL Intravenous Q12H    Infusions:   prismasol BGK 4/2.5 500 mL/hr at 04/22/19 1205    prismasol BGK 4/2.5 300 mL/hr at 04/23/19 0246   sodium chloride 10 mL/hr at 04/21/19 2209   sodium chloride 10 mL/hr at 04/23/19 0700   amiodarone 30 mg/hr (04/23/19 0700)   DOBUTamine 1.5 mcg/kg/min (04/23/19 0700)   epinephrine Stopped (04/19/19 0019)   heparin 2,300 Units/hr (04/23/19 0700)   lactated ringers     lactated ringers     lactated ringers Stopped (04/22/19 0900)   norepinephrine (LEVOPHED) Adult infusion 3 mcg/min (04/23/19 0700)   piperacillin-tazobactam Stopped (04/23/19 0631)   prismasol BGK 4/2.5 1,500 mL/hr at 04/23/19 0504    PRN Medications: Place/Maintain arterial line **AND** sodium chloride, sodium chloride, heparin, lactated ringers, menthol-cetylpyridinium, metoprolol tartrate, morphine injection, ondansetron (ZOFRAN) IV, oxyCODONE, sodium chloride flush, sodium chloride flush, traMADol   Assessment/Plan   1. Shock: Suspect initial cardiogenic shock in setting of atypical atrial flutter with RVR and ischemia from severe left main stenosis. May have developed a bit of sepsis  component on 9/25.  Echo 9/25 EF 30%.  BP still marginal and have not been able to wean completely off drips.  He is on norepinephrine 3 and dobutamine 1.5.  -  Increase midodrine to 10 mg tid.  - Try again to wean off norepinephrine => OK for SBP > 85, MAP > 60 2. CAD: Initially suspected acute anterior MI with occluded mid LAD.  However, suspect this was CTO.  Dr. Angelena Form did angioplasty but unable to restore flow.  Patient had a 90% distal left main stenosis compromising flow in LCx and large diagonal. Suspect this led to ischemia/chest pain when  the patient went into rapid atrial flutter prior to admission.  Patient taken urgently for CABG supported by Impella. S/P CABG x3 on 9/24 (unable to graft LAD).  - Statin and ASA  - Continue heparin gtt, eventually transition to Eliquis given atrial flutter/fibrillation.  - Hold b-blocker for now with shock.  3. Acute on chronic systolic CHF: Echo with EF in 30% range with peri-apical severe hypokinesis.  Suspect ischemic cardiomyopathy.  Elevated filling pressures on RHC.  CVP 9 today with co-ox lower at 50% => ?hemoglobin lower or drawn in early am with oxygen saturation low.  - Increase midodrine to 10 mg tid and try to wean off norepinephrine today as above.  - Keep dobutamine at 1.5 for now, repeat co-ox.  Will need CBC as well this morning as co-ox may be low with lower hgb.   - Decrease UF to 75 cc/hr with CVP 9 and weight at baseline.  4. Paroxysmal Atrial flutter/fib: He had atypical atrial flutter initially, post-op had atrial fibrillation.  On and off atrial fibrillation last couple days.  - Continue amiodarone gtt today at 30 mg/hr.    - On heparin drip.  - Eventual switch to Eliquis once CTs out.  5. ESRD: Recent start on HD.  Nephrology following. On CVVH.  - Can decrease UF to 75 cc/hr with improved volume. - Need transition to HD when we can get off drips.   6. ID: Fevers resolved. WBC 32k-> 20->19.7 ->16.8 -> 13.3.  - He is on Zosyn.   - cx NGTD 7. Anemia: Hgb 7.7 => 7.5 yesterday. No obvious source of bleeding. Abdomen nontender.  - Needs CBC today, ?transition.   8. Thrombocytopenia: Recovering. HIT Antibody negative.    CRITICAL CARE Performed by: Loralie Champagne  Total critical care time: 35 minutes  Critical care time was exclusive of separately billable procedures and treating other patients.  Critical care was necessary to treat or prevent imminent or life-threatening deterioration.  Critical care was time spent personally by me on the following activities: development of treatment plan with patient and/or surrogate as well as nursing, discussions with consultants, evaluation of patient's response to treatment, examination of patient, obtaining history from patient or surrogate, ordering and performing treatments and interventions, ordering and review of laboratory studies, ordering and review of radiographic studies, pulse oximetry and re-evaluation of patient's condition.   Length of Stay: 7  Loralie Champagne, MD  04/23/2019, 8:04 AM  Advanced Heart Failure Team Pager [phone] (M-F; 7a - 4p)  Please contact [facility] for night-coverage after hours (4p -7a ) and weekends on [email or web address]

## 2019-04-23 NOTE — Progress Notes (Signed)
      [address]       [place] [address]             [phone]                 7 Days Post-Op Procedure(s) (LRB): CORONARY ARTERY BYPASS GRAFTING (CABG)X3  , WITH ENDOSCOPIC HARVESTING OF RIGHT GREATER SAPHENOUS VEIN (N/A) TRANSESOPHAGEAL ECHOCARDIOGRAM (TEE) (N/A)   Events: No events  _______________________________________________________________ Vitals: BP 95/64   Pulse 89   Temp 98.4 F (36.9 C) (Oral)   Resp 15   Ht 6' (1.829 m)   Wt 125.8 kg   SpO2 99%   BMI 37.61 kg/m   - Neuro: alert NAD  - Cardiovascular: sinus, regular  Drips: Levo 5.  CVP:  [6 mmHg-10 mmHg] 10 mmHg  - Pulm: EWOB    ABG    Component Value Date/Time   PHART 7.376 04/20/2019 0515   PCO2ART 46.8 04/20/2019 0515   PO2ART 96.0 04/20/2019 0515   HCO3 27.4 04/20/2019 0515   TCO2 29 04/20/2019 0515   ACIDBASEDEF 4.0 (H) 04/18/2019 0559   O2SAT 66.4 04/23/2019 0844    - Abd: soft NT - Extremity: edematous  .Intake/Output      09/30 0701 - 10/01 0700 10/01 0701 - 10/02 0700   P.O. 440 50   I.V. (mL/kg) 1441.7 (11.5) 166.2 (1.3)   IV Piggyback 200    Total Intake(mL/kg) 2081.7 (16.5) 216.2 (1.7)   Urine (mL/kg/hr) 15 (0) 0 (0)   Drains     Other 4384 489   Stool 0 0   Chest Tube     Total Output 4399 489   Net -2317.3 -272.8        Urine Occurrence 1 x    Stool Occurrence 3 x 1 x      _______________________________________________________________ Labs: CBC Latest Ref Rng & Units 04/23/2019 04/22/2019 04/21/2019  WBC 4.0 - 10.5 K/uL 13.4(H) 13.3(H) 16.8(H)  Hemoglobin 13.0 - 17.0 g/dL 7.9(L) 7.5(L) 7.7(L)  Hematocrit 39.0 - 52.0 % 25.7(L) 23.7(L) 23.6(L)  Platelets 150 - 400 K/uL 170 119(L) 99(L)   CMP Latest Ref Rng & Units 04/23/2019 04/22/2019 04/22/2019  Glucose 70 - 99 mg/dL 97 108(H) 87  BUN 8 - 23 mg/dL 19 21 20   Creatinine 0.61 - 1.24 mg/dL 2.51(H) 2.44(H) 2.54(H)  Sodium 135 - 145 mmol/L 137 136 137  Potassium 3.5 - 5.1 mmol/L 3.9 4.1 3.8   Chloride 98 - 111 mmol/L 103 100 100  CO2 22 - 32 mmol/L 25 25 23   Calcium 8.9 - 10.3 mg/dL 11.3(H) 11.3(H) 11.2(H)  Total Protein 6.5 - 8.1 g/dL - - -  Total Bilirubin 0.3 - 1.2 mg/dL - - -  Alkaline Phos 38 - 126 U/L - - -  AST 15 - 41 U/L - - -  ALT 0 - 44 U/L - - -    CXR: stable  _______________________________________________________________  Assessment and Plan: POD 7 s/p emergency CABG  Neuro: pain control CV: remains on levo.  Midodrine increased again.  Low SVR state.  Will not pull as much fluid on CRRT Pulm: pulm toilet Renal: on CRRT, pulling less fluid GI: on reg diet Heme: stable ID: afebrile Endo: SSI Dispo: continue ICU care.  Melodie Bouillon, MD 04/23/2019 10:32 AM

## 2019-04-24 LAB — CBC WITH DIFFERENTIAL/PLATELET
Abs Immature Granulocytes: 0.27 10*3/uL — ABNORMAL HIGH (ref 0.00–0.07)
Basophils Absolute: 0 10*3/uL (ref 0.0–0.1)
Basophils Relative: 0 %
Eosinophils Absolute: 0.4 10*3/uL (ref 0.0–0.5)
Eosinophils Relative: 3 %
HCT: 25.5 % — ABNORMAL LOW (ref 39.0–52.0)
Hemoglobin: 8 g/dL — ABNORMAL LOW (ref 13.0–17.0)
Immature Granulocytes: 2 %
Lymphocytes Relative: 17 %
Lymphs Abs: 2.1 10*3/uL (ref 0.7–4.0)
MCH: 28.9 pg (ref 26.0–34.0)
MCHC: 31.4 g/dL (ref 30.0–36.0)
MCV: 92.1 fL (ref 80.0–100.0)
Monocytes Absolute: 1.9 10*3/uL — ABNORMAL HIGH (ref 0.1–1.0)
Monocytes Relative: 16 %
Neutro Abs: 7.5 10*3/uL (ref 1.7–7.7)
Neutrophils Relative %: 62 %
Platelets: 194 10*3/uL (ref 150–400)
RBC: 2.77 MIL/uL — ABNORMAL LOW (ref 4.22–5.81)
RDW: 16.5 % — ABNORMAL HIGH (ref 11.5–15.5)
WBC: 12.2 10*3/uL — ABNORMAL HIGH (ref 4.0–10.5)
nRBC: 0.2 % (ref 0.0–0.2)

## 2019-04-24 LAB — COOXEMETRY PANEL
Carboxyhemoglobin: 1.8 % — ABNORMAL HIGH (ref 0.5–1.5)
Methemoglobin: 0.7 % (ref 0.0–1.5)
O2 Saturation: 50.6 %
Total hemoglobin: 13.1 g/dL (ref 12.0–16.0)

## 2019-04-24 LAB — RENAL FUNCTION PANEL
Albumin: 2.4 g/dL — ABNORMAL LOW (ref 3.5–5.0)
Albumin: 2.3 g/dL — ABNORMAL LOW (ref 3.5–5.0)
Anion gap: 10 (ref 5–15)
Anion gap: 9 (ref 5–15)
BUN: 20 mg/dL (ref 8–23)
BUN: 22 mg/dL (ref 8–23)
CO2: 26 mmol/L (ref 22–32)
CO2: 26 mmol/L (ref 22–32)
Calcium: 11.9 mg/dL — ABNORMAL HIGH (ref 8.9–10.3)
Calcium: 12 mg/dL — ABNORMAL HIGH (ref 8.9–10.3)
Chloride: 101 mmol/L (ref 98–111)
Chloride: 102 mmol/L (ref 98–111)
Creatinine, Ser: 2.47 mg/dL — ABNORMAL HIGH (ref 0.61–1.24)
Creatinine, Ser: 2.73 mg/dL — ABNORMAL HIGH (ref 0.61–1.24)
GFR calc Af Amer: 30 mL/min — ABNORMAL LOW (ref >60)
GFR calc Af Amer: 26 mL/min — ABNORMAL LOW (ref >60)
GFR calc non Af Amer: 26 mL/min — ABNORMAL LOW (ref >60)
GFR calc non Af Amer: 23 mL/min — ABNORMAL LOW (ref >60)
Glucose, Bld: 98 mg/dL (ref 70–99)
Glucose, Bld: 98 mg/dL (ref 70–99)
Phosphorus: 3.2 mg/dL (ref 2.5–4.6)
Phosphorus: 3.6 mg/dL (ref 2.5–4.6)
Potassium: 3.8 mmol/L (ref 3.5–5.1)
Potassium: 4 mmol/L (ref 3.5–5.1)
Sodium: 137 mmol/L (ref 135–145)
Sodium: 137 mmol/L (ref 135–145)

## 2019-04-24 LAB — MAGNESIUM: Magnesium: 2.5 mg/dL — ABNORMAL HIGH (ref 1.7–2.4)

## 2019-04-24 LAB — APTT: aPTT: 38 seconds — ABNORMAL HIGH (ref 24–36)

## 2019-04-24 MED ORDER — ASPIRIN EC 81 MG PO TBEC
81.0000 mg | DELAYED_RELEASE_TABLET | Freq: Every day | ORAL | Status: DC
  Administered 2019-04-25 – 2019-04-30 (×6): 81 mg via ORAL
  Filled 2019-04-24 (×6): qty 1

## 2019-04-24 NOTE — Progress Notes (Addendum)
[facility] Progress Note  Subjective: stable, no new c/o.    Vitals:   04/24/19 0900 04/24/19 0930 04/24/19 1002 04/24/19 1100  BP: (!) 99/58 (!) 92/49 (!) 90/53 97/60  Pulse: 88 88 87   Resp: 18 16 (!) 27 (!) 23  Temp:      TempSrc:      SpO2: 100% 96% 99%   Weight:      Height:        Inpatient medications: . sodium chloride   Intravenous Once  . apixaban  5 mg Oral BID  . aspirin EC  325 mg Oral Daily   Or  . aspirin  324 mg Per Tube Daily  . B-complex with vitamin C  1 tablet Oral Daily  . bisacodyl  10 mg Oral Daily   Or  . bisacodyl  10 mg Rectal Daily  . Chlorhexidine Gluconate Cloth  6 each Topical Daily  . febuxostat  40 mg Oral QODAY  . fluticasone  2 spray Each Nare Daily  . mouth rinse  15 mL Mouth Rinse BID  . midodrine  10 mg Oral TID WC  . pantoprazole  40 mg Oral Daily  . rosuvastatin  5 mg Oral q1800  . sodium chloride flush  10-40 mL Intracatheter Q12H  . sodium chloride flush  3 mL Intravenous Q12H   .  prismasol BGK 4/2.5 500 mL/hr at 04/24/19 0528  .  prismasol BGK 4/2.5 300 mL/hr at 04/23/19 1919  . sodium chloride 10 mL/hr at 04/21/19 2209  . sodium chloride Stopped (04/23/19 1146)  . amiodarone 30 mg/hr (04/24/19 1200)  . lactated ringers    . lactated ringers    . lactated ringers Stopped (04/22/19 0900)  . norepinephrine (LEVOPHED) Adult infusion Stopped (04/24/19 0801)  . piperacillin-tazobactam Stopped (04/24/19 0602)  . prismasol BGK 4/2.5 1,500 mL/hr at 04/24/19 0857   Place/Maintain arterial line **AND** sodium chloride, sodium chloride, heparin, lactated ringers, menthol-cetylpyridinium, metoprolol tartrate, morphine injection, ondansetron (ZOFRAN) IV, oxyCODONE, sodium chloride flush, sodium chloride flush, traMADol    Exam: General:NAD, comfortable Heart:RRR, s1s2 nl Lungs: clear with decreased breath sound basally, no wheezing Abdomen:soft, Non-tender, non-distended Extremities: no sig lower extremity edema   Dialysis Access: RUE AVF + bruit    Dialysis:  [place] MWF  - new start, had OP HD on 9/21 and 9/23 prior to admission  3h  132kg  300/600  RUE AVF  2/2 bath  Hep none   venofer 100 mg ordered each treatment x 4, Hb 8.5 on 9/21  no ESA order yet  PTH 1366 on 04/15/19   Assessment/ Plan #Cardiogenic shock/STEMI: Status post emergent CABG on 9/24.  SP Impella, removed Sunday. As per heart failure team and CT surgery.  - dobutamine and levo off as of 8 am today - midodrine at 10 tid  # ESRD: new start had 1st / 2nd HD last week prior to admission. Had post -op cardiogenic shock and started CRRT here on 9/25.  On systemic heparin gtt.  - will plan on finishing up CRRT later today - will plan on regular HD on Monday  # Anemia: Due to ABLA and CKD: Transfuse blood prn  # Secondary hyperparathyroidism: hypercalcemic prob immobilization. Vit D has been on hold here.  PTH 1366 on 9/23 and OP Ca++ 11.3 9/23  - cont to follow, low Ca+ bath when on regular HD  # CAD sp CABG x 3 on 9/24  # Afib - on amio drip and IV  heparin  # ID - on empiric abx Zosyn, WBC declining   Alex Williams 04/24/2019, 12:30 PM  Iron/TIBC/Ferritin/ %Sat    Component Value Date/Time   IRON 137 04/16/2019 2343   IRON 32 (L) 01/26/2019 1100   TIBC 188 (L) 04/16/2019 2343   TIBC 226 (L) 01/26/2019 1100   FERRITIN 310 04/16/2019 2343   FERRITIN 301 01/26/2019 1100   IRONPCTSAT 73 (H) 04/16/2019 2343   IRONPCTSAT 14 (L) 01/26/2019 1100   Recent Labs  Lab 04/24/19 0423  NA 137  K 3.8  CL 101  CO2 26  GLUCOSE 98  BUN 20  CREATININE 2.47*  CALCIUM 11.9*  PHOS 3.2  ALBUMIN 2.4*   No results for input(s): AST, ALT, ALKPHOS, BILITOT, PROT in the last 168 hours. Recent Labs  Lab 04/24/19 0423  WBC 12.2*  HGB 8.0*  HCT 25.5*  PLT 194

## 2019-04-24 NOTE — Evaluation (Signed)
Physical Therapy Evaluation Patient Details Name: Alex Williams MRN: [id number] DOB: 1951/04/10 Today's Date: 04/24/2019   History of Present Illness  Pt admit with STEMI.  Multiple complications including shock, CHF.  Emergent CABG x 3 on 9/24 with Impella placed 9/24-9/27 for awhile and on CRRT to present.    Clinical Impression  Pt admitted with above diagnosis. Pt was able to take a few pivotal steps to the recliner.  CRRT in place. Pt needed min assist of 2 for safety and cues.  Practiced sit to stand and LE strengthening as well.  Will continue to progress pt as able.   Pt currently with functional limitations due to the deficits listed below (see PT Problem List). Pt will benefit from skilled PT to increase their independence and safety with mobility to allow discharge to the venue listed below.      Follow Up Recommendations Home health PT;Supervision/Assistance - 24 hour    Equipment Recommendations  Rolling walker with 5" wheels;3in1 (PT)(Needs tall equipment)    Recommendations for Other Services       Precautions / Restrictions Precautions Precautions: Fall;Sternal, CRRT Precaution Booklet Issued: Yes (comment) Restrictions Weight Bearing Restrictions: No      Mobility  Bed Mobility Overal bed mobility: Needs Assistance Bed Mobility: Rolling;Sidelying to Sit Rolling: Min assist Sidelying to sit: Min assist       General bed mobility comments:  alittle light assist to come to EOB wtih cues for technique  Transfers Overall transfer level: Needs assistance Equipment used: 2 person hand held assist Transfers: Sit to/from [facility] Sit to Stand: Min assist;+2 safety/equipment Stand pivot transfers: Min assist;+2 safety/equipment       General transfer comment: Pt able to stand and pivot to chair.  PRacticed sit to stand x 3 as well wtih hands on knees technique  Ambulation/Gait Ambulation/Gait assistance: Min guard;+2 safety/equipment;Min  assist Gait Distance (Feet): 3 Feet Assistive device: 2 person hand held assist Gait Pattern/deviations: Step-through pattern;Decreased stride length;Trunk flexed;Wide base of support   Gait velocity interpretation: <1.31 ft/sec, indicative of household ambulator General Gait Details: Pt took about 3 steps to chair.  Marched in place as well.  FAtigues quickly.   Stairs            Wheelchair Mobility    Modified Rankin (Stroke Patients Only)       Balance Overall balance assessment: Needs assistance Sitting-balance support: No upper extremity supported;Feet supported Sitting balance-Leahy Scale: Fair     Standing balance support: Bilateral upper extremity supported;During functional activity Standing balance-Leahy Scale: Poor Standing balance comment: relies on UE support for balance                             Pertinent Vitals/Pain Pain Assessment: No/denies pain Faces Pain Scale: No hurt    Home Living Family/patient expects to be discharged to:: Private residence Living Arrangements: Alone Available Help at Discharge: Family;Available 24 hours/day(sister, daughter and son) Type of Home: House Home Access: Stairs to enter Entrance Stairs-Rails: Right;Left;Can reach both Technical brewer of Steps: 3 Home Layout: One level Home Equipment: None      Prior Function Level of Independence: Independent         Comments: driving , semi retired     Engineer, manufacturing Dominance   Dominant Hand: Right    Extremity/Trunk Assessment   Upper Extremity Assessment Upper Extremity Assessment: Defer to OT evaluation    Lower Extremity Assessment Lower Extremity  Assessment: Generalized weakness    Cervical / Trunk Assessment Cervical / Trunk Assessment: Normal  Communication   Communication: No difficulties  Cognition Arousal/Alertness: Awake/alert Behavior During Therapy: WFL for tasks assessed/performed Overall Cognitive Status: Within Functional  Limits for tasks assessed                                        General Comments      Exercises General Exercises - Lower Extremity Long Arc Quad: AROM;Both;10 reps;Seated Hip Flexion/Marching: AROM;Both;10 reps;Seated   Assessment/Plan    PT Assessment Patient needs continued PT services  PT Problem List Decreased activity tolerance;Decreased balance;Decreased mobility;Decreased knowledge of use of DME;Decreased safety awareness;Decreased knowledge of precautions;Cardiopulmonary status limiting activity;Obesity       PT Treatment Interventions DME instruction;Gait training;Functional mobility training;Therapeutic activities;Therapeutic exercise;Balance training;Patient/family education;Stair training    PT Goals (Current goals can be found in the Care Plan section)  Acute Rehab PT Goals Patient Stated Goal: to go home PT Goal Formulation: With patient Time For Goal Achievement: 05/08/19 Potential to Achieve Goals: Good    Frequency Min 3X/week   Barriers to discharge        Co-evaluation               AM-PAC PT "6 Clicks" Mobility  Outcome Measure Help needed turning from your back to your side while in a flat bed without using bedrails?: A Little Help needed moving from lying on your back to sitting on the side of a flat bed without using bedrails?: A Little Help needed moving to and from a bed to a chair (including a wheelchair)?: A Little Help needed standing up from a chair using your arms (e.g., wheelchair or bedside chair)?: A Lot Help needed to walk in hospital room?: A Little Help needed climbing 3-5 steps with a railing? : A Little 6 Click Score: 17    End of Session Equipment Utilized During Treatment: Gait belt Activity Tolerance: Patient limited by fatigue;Patient limited by pain Patient left: in chair;with call bell/phone within reach;with chair alarm set;with nursing/sitter in room Nurse Communication: Mobility status PT Visit  Diagnosis: Unsteadiness on feet (R26.81);Muscle weakness (generalized) (M62.81)    Time: [id number] PT Time Calculation (min) (ACUTE ONLY): 23 min   Charges:   PT Evaluation $PT Eval Moderate Complexity: 1 Mod PT Treatments $Therapeutic Activity: 8-22 mins        [place] Pager:  [phone]  Office:  [place] 04/24/2019, 12:38 PM

## 2019-04-24 NOTE — Progress Notes (Addendum)
Patient ID: Alex Williams, male   DOB: [birth year], 68 y.o.   MRN: [id number]     Advanced Heart Failure Rounding Note  PCP-Cardiologist: No primary care provider on file.   Subjective:    9/24 S/P CABG x3. Impella in place.  9/27 Impella removed.   Remains on CRRT. On NE 1 dobutamine 1.5. CVP 4, co-ox lower this morning at 51%.  Now on midodrine 10 tid.   UF at 75 cc/hr, net I/O -2.3, weight now below baseline.   Hgb 7.7 => 7.5 => 8    He has been in and out of atrial fibrillation, now in NSR in 90s.   Denies SOB. Having some chest soreness.     Objective:   Weight Range: 124.2 kg Body mass index is 37.14 kg/m.   Vital Signs:   Temp:  [97.9 F (36.6 C)-98.4 F (36.9 C)] 97.9 F (36.6 C) (10/02 0430) Pulse Rate:  [86-103] 91 (10/02 0700) Resp:  [11-27] 14 (10/02 0700) BP: (54-119)/(37-96) 83/50 (10/02 0700) SpO2:  [89 %-100 %] 95 % (10/02 0700) Weight:  [124.2 kg] 124.2 kg (10/02 0500) Last BM Date: 04/23/19  Weight change: Filed Weights   04/22/19 0500 04/23/19 0600 04/24/19 0500  Weight: 125.8 kg 125.8 kg 124.2 kg    Intake/Output:   Intake/Output Summary (Last 24 hours) at 04/24/2019 0722 Last data filed at 04/24/2019 0700 Gross per 24 hour  Intake 1059.47 ml  Output 2760 ml  Net -1700.53 ml      Physical Exam   CVP 4.  General:  Well appearing. No resp difficulty HEENT: normal Neck: supple. no JVD. Carotids 2+ bilat; no bruits. No lymphadenopathy or thryomegaly appreciated. Cor: PMI nondisplaced. Regular rate & rhythm. No rubs, gallops or murmurs. Sternal incision approximated.  Lungs: clear on room air.  Abdomen: soft, nontender, nondistended. No hepatosplenomegaly. No bruits or masses. Good bowel sounds. Extremities: no cyanosis, clubbing, rash, edema. RUE A-V fistual  Neuro: alert & orientedx3, cranial nerves grossly intact. moves all 4 extremities w/o difficulty. Affect pleasant   EKG    In and out A fib   Labs    CBC Recent Labs   04/23/19 0851 04/24/19 0423  WBC 13.4* 12.2*  NEUTROABS  --  7.5  HGB 7.9* 8.0*  HCT 25.7* 25.5*  MCV 93.5 92.1  PLT 170 [id number]   Basic Metabolic Panel Recent Labs    04/23/19 0408 04/23/19 1600 04/24/19 0423  NA 137 139 137  K 3.9 3.9 3.8  CL 103 104 101  CO2 25 26 26   GLUCOSE 97 131* 98  BUN 19 21 20   CREATININE 2.51* 2.38* 2.47*  CALCIUM 11.3* 11.3* 11.9*  MG 2.7*  --  2.5*  PHOS 3.0 2.8 3.2   Liver Function Tests Recent Labs    04/23/19 1600 04/24/19 0423  ALBUMIN 2.4* 2.4*   No results for input(s): LIPASE, AMYLASE in the last 72 hours. Cardiac Enzymes No results for input(s): CKTOTAL, CKMB, CKMBINDEX, TROPONINI in the last 72 hours.  BNP: BNP (last 3 results) Recent Labs    08/25/18 1746 01/30/19 0819 02/05/19 0951  BNP 513.0* 1,138.2* 1,186.2*    ProBNP (last 3 results) No results for input(s): PROBNP in the last 8760 hours.   D-Dimer No results for input(s): DDIMER in the last 72 hours. Hemoglobin A1C No results for input(s): HGBA1C in the last 72 hours. Fasting Lipid Panel No results for input(s): CHOL, HDL, LDLCALC, TRIG, CHOLHDL, LDLDIRECT in the last 72 hours. Thyroid Function  Tests No results for input(s): TSH, T4TOTAL, T3FREE, THYROIDAB in the last 72 hours.  Invalid input(s): FREET3  Other results:   Imaging    No results found.   Medications:     Scheduled Medications: . sodium chloride   Intravenous Once  . apixaban  5 mg Oral BID  . aspirin EC  325 mg Oral Daily   Or  . aspirin  324 mg Per Tube Daily  . B-complex with vitamin C  1 tablet Oral Daily  . bisacodyl  10 mg Oral Daily   Or  . bisacodyl  10 mg Rectal Daily  . Chlorhexidine Gluconate Cloth  6 each Topical Daily  . febuxostat  40 mg Oral QODAY  . fluticasone  2 spray Each Nare Daily  . mouth rinse  15 mL Mouth Rinse BID  . midodrine  10 mg Oral TID WC  . pantoprazole  40 mg Oral Daily  . rosuvastatin  5 mg Oral q1800  . sodium chloride flush  10-40 mL  Intracatheter Q12H  . sodium chloride flush  3 mL Intravenous Q12H    Infusions: .  prismasol BGK 4/2.5 500 mL/hr at 04/24/19 0528  .  prismasol BGK 4/2.5 300 mL/hr at 04/23/19 1919  . sodium chloride 10 mL/hr at 04/21/19 2209  . sodium chloride Stopped (04/23/19 1146)  . amiodarone 30 mg/hr (04/24/19 0700)  . DOBUTamine 1.5 mcg/kg/min (04/24/19 0700)  . epinephrine Stopped (04/19/19 0019)  . lactated ringers    . lactated ringers    . lactated ringers Stopped (04/22/19 0900)  . norepinephrine (LEVOPHED) Adult infusion 1.5 mcg/min (04/24/19 0700)  . piperacillin-tazobactam Stopped (04/24/19 0602)  . prismasol BGK 4/2.5 1,500 mL/hr at 04/24/19 0528    PRN Medications: Place/Maintain arterial line **AND** sodium chloride, sodium chloride, heparin, lactated ringers, menthol-cetylpyridinium, metoprolol tartrate, morphine injection, ondansetron (ZOFRAN) IV, oxyCODONE, sodium chloride flush, sodium chloride flush, traMADol   Assessment/Plan   1. Shock: Suspect initial cardiogenic shock in setting of atypical atrial flutter with RVR and ischemia from severe left main stenosis. May have developed a bit of sepsis component on 9/25.  Echo 9/25 EF 30%.  BP still marginal and have not been able to wean completely off drips.  He is on norepinephrine 1 and dobutamine 1.5.  -  Continue midodrine to 10 mg tid.  - Try again to wean off norepinephrine => OK for SBP > 85, MAP > 60 2. CAD: Initially suspected acute anterior MI with occluded mid LAD.  However, suspect this was CTO.  Dr. Angelena Form did angioplasty but unable to restore flow.  Patient had a 90% distal left main stenosis compromising flow in LCx and large diagonal. Suspect this led to ischemia/chest pain when the patient went into rapid atrial flutter prior to admission.  Patient taken urgently for CABG supported by Impella. S/P CABG x3 on 9/24 (unable to graft LAD).  - Statin and ASA  - On eliquis 5 mg twice a day.  - Hold b-blocker for now  with shock.  3. Acute on chronic systolic CHF: Echo with EF in 30% range with peri-apical severe hypokinesis.  Suspect ischemic cardiomyopathy.  Elevated filling pressures on RHC.  CVP 9 today with co-ox lower at 50% => ?hemoglobin lower or drawn in early am with oxygen saturation low.  -Continue  midodrine to 10 mg tid . Norepi 1 mcg and dobutamine 1.5 mcg. CO-OX 51%  - CVP down to 4. UF at 75 cc/hr  4. Paroxysmal Atrial flutter/fib: He had  atypical atrial flutter initially, post-op had atrial fibrillation.  On and off atrial fibrillation last couple days.  - Continue amiodarone gtt today at 30 mg/hr.    - On eliquis 5 mg twice a day. .  5. ESRD: Recent start on HD.  Nephrology following. On CVVH.  -  UF pulling  75 cc/hr with improved volume.CVP down to 4.  - Need transition to HD when we can get off drips.   6. ID: Fevers resolved. WBC 32k-> 20->19.7 ->16.8 -> 13.3->12.2   - He is on Zosyn.  - cx NGTD 7. Anemia: Hgb 7.7 => 7.5=>8 yesterday. No obvious source of bleeding. Abdomen nontender.  8. Thrombocytopenia: Recovering. HIT Antibody negative.     Length of Stay: [place], NP  04/24/2019, 7:22 AM  Advanced Heart Failure Team Pager [phone] (M-F; [place])  Please contact [facility] for night-coverage after hours (4p -7a ) and weekends on [email or web address]  Patient seen with NP, agree with the above note.   Stable this morning on NE 1.5, dobutamine 1.5.  Co-ox 51% with CVP 4.  UF via CVVH 75 cc/hr.  Has been up to chair.   General: NAD Neck: No JVD, no thyromegaly or thyroid nodule.  Lungs: Clear to auscultation bilaterally with normal respiratory effort. CV: Nondisplaced PMI.  Heart regular S1/S2, no S3/S4, no murmur.  No peripheral edema.   Abdomen: Soft, nontender, no hepatosplenomegaly, no distention.  Skin: Intact without lesions or rashes.  Neurologic: Alert and oriented x 3.  Psych: Normal affect. Extremities: No clubbing or cyanosis.  HEENT: Normal.   I am going to  stop dobutamine this morning.  We will try to wean off the rest of his norepinephrine through the day.  Continue midodrine 10 mg tid.   He is in NSR on Eliquis + amiodarone gtt.  Will stop amiodarone gtt when off dobutamine/NE and transition to po.   With CVP 4, will decrease CVVH UF to 50 cc/hr.  If we can get off norepinephrine today, will be able to get him back to HD.   Hgb stable, WBCs decreasing, afebrile.  He remains on Zosyn.   Mobilize with PT.   CRITICAL CARE Performed by: Loralie Champagne  Total critical care time: 35 minutes  Critical care time was exclusive of separately billable procedures and treating other patients.  Critical care was necessary to treat or prevent imminent or life-threatening deterioration.  Critical care was time spent personally by me on the following activities: development of treatment plan with patient and/or surrogate as well as nursing, discussions with consultants, evaluation of patient's response to treatment, examination of patient, obtaining history from patient or surrogate, ordering and performing treatments and interventions, ordering and review of laboratory studies, ordering and review of radiographic studies, pulse oximetry and re-evaluation of patient's condition.  Loralie Champagne 04/24/2019 7:36 AM

## 2019-04-24 NOTE — Progress Notes (Addendum)
TCTS DAILY ICU PROGRESS NOTE                   [address]            ,[place] [address]          [phone]   8 Days Post-Op Procedure(s) (LRB): CORONARY ARTERY BYPASS GRAFTING (CABG)X3  , WITH ENDOSCOPIC HARVESTING OF RIGHT GREATER SAPHENOUS VEIN (N/A) TRANSESOPHAGEAL ECHOCARDIOGRAM (TEE) (N/A)  Total Length of Stay:  LOS: 8 days   Subjective: Patient on CRRT this am. He is eating breakfast. He has no specific complaints this am.  Objective: Vital signs in last 24 hours: Temp:  [97.9 F (36.6 C)-98.4 F (36.9 C)] 97.9 F (36.6 C) (10/02 0430) Pulse Rate:  [26-103] 68 (10/02 0730) Cardiac Rhythm: Atrial fibrillation (10/02 0400) Resp:  [11-27] 16 (10/02 0730) BP: (54-119)/(37-96) 112/70 (10/02 0730) SpO2:  [89 %-100 %] 95 % (10/02 0730) Weight:  [124.2 kg] 124.2 kg (10/02 0500)  Filed Weights   04/22/19 0500 04/23/19 0600 04/24/19 0500  Weight: 125.8 kg 125.8 kg 124.2 kg    Weight change: -1.6 kg   Hemodynamic parameters for last 24 hours: CVP:  [4 mmHg-10 mmHg] 4 mmHg  Intake/Output from previous day: 10/01 0701 - 10/02 0700 In: 1059.5 [P.O.:180; I.V.:675.4; IV Piggyback:204] Out: 2760   Intake/Output this shift: No intake/output data recorded.  Current Meds: Scheduled Meds: . sodium chloride   Intravenous Once  . apixaban  5 mg Oral BID  . aspirin EC  325 mg Oral Daily   Or  . aspirin  324 mg Per Tube Daily  . B-complex with vitamin C  1 tablet Oral Daily  . bisacodyl  10 mg Oral Daily   Or  . bisacodyl  10 mg Rectal Daily  . Chlorhexidine Gluconate Cloth  6 each Topical Daily  . febuxostat  40 mg Oral QODAY  . fluticasone  2 spray Each Nare Daily  . mouth rinse  15 mL Mouth Rinse BID  . midodrine  10 mg Oral TID WC  . pantoprazole  40 mg Oral Daily  . rosuvastatin  5 mg Oral q1800  . sodium chloride flush  10-40 mL Intracatheter Q12H  . sodium chloride flush  3 mL Intravenous Q12H   Continuous Infusions: .  prismasol BGK  4/2.5 500 mL/hr at 04/24/19 0528  .  prismasol BGK 4/2.5 300 mL/hr at 04/23/19 1919  . sodium chloride 10 mL/hr at 04/21/19 2209  . sodium chloride Stopped (04/23/19 1146)  . amiodarone 30 mg/hr (04/24/19 0700)  . epinephrine Stopped (04/19/19 0019)  . lactated ringers    . lactated ringers    . lactated ringers Stopped (04/22/19 0900)  . norepinephrine (LEVOPHED) Adult infusion 1.5 mcg/min (04/24/19 0700)  . piperacillin-tazobactam Stopped (04/24/19 0602)  . prismasol BGK 4/2.5 1,500 mL/hr at 04/24/19 0528   PRN Meds:.Place/Maintain arterial line **AND** sodium chloride, sodium chloride, heparin, lactated ringers, menthol-cetylpyridinium, metoprolol tartrate, morphine injection, ondansetron (ZOFRAN) IV, oxyCODONE, sodium chloride flush, sodium chloride flush, traMADol  General appearance: alert, cooperative and no distress Heart: RRR Lungs: Clear to auscultation bilaterally Abdomen: Soft, obese, non tender,  bowel sounds Extremities: Mild LE edema. Ecchymosis right thigh Wound: Sternal wound is clean and dry. RLE wounds are clean and dry. There is some bloody like drainage from wound where JP was but no sign of infection   Lab Results: CBC: Recent Labs    04/23/19 0851 04/24/19 0423  WBC 13.4* 12.2*  HGB  7.9* 8.0*  HCT 25.7* 25.5*  PLT 170 194   BMET:  Recent Labs    04/23/19 1600 04/24/19 0423  NA 139 137  K 3.9 3.8  CL 104 101  CO2 26 26  GLUCOSE 131* 98  BUN 21 20  CREATININE 2.38* 2.47*  CALCIUM 11.3* 11.9*    CMET: Lab Results  Component Value Date   WBC 12.2 (H) 04/24/2019   HGB 8.0 (L) 04/24/2019   HCT 25.5 (L) 04/24/2019   PLT 194 04/24/2019   GLUCOSE 98 04/24/2019   CHOL 201 (H) 04/16/2019   TRIG 178 (H) 04/16/2019   HDL 23 (L) 04/16/2019   LDLDIRECT 155.0 01/20/2009   LDLCALC 142 (H) 04/16/2019   ALT 8 02/05/2019   AST 10 (L) 02/05/2019   NA 137 04/24/2019   K 3.8 04/24/2019   CL 101 04/24/2019   CREATININE 2.47 (H) 04/24/2019   BUN 20  04/24/2019   CO2 26 04/24/2019   TSH 2.840 08/25/2018   PSA 1.28 03/01/2016   INR 1.5 (H) 04/16/2019   HGBA1C 5.5 04/17/2019     PT/INR: No results for input(s): LABPROT, INR in the last 72 hours. Radiology: No results found.   Assessment/Plan: S/P Procedure(s) (LRB): CORONARY ARTERY BYPASS GRAFTING (CABG)X3  , WITH ENDOSCOPIC HARVESTING OF RIGHT GREATER SAPHENOUS VEIN (N/A) TRANSESOPHAGEAL ECHOCARDIOGRAM (TEE) (N/A)   1. CV-S/p STEMI, cardiogenic shock.PAF. SR this am.  On Amiodarone and Levophed drips. time. Also, on Apixaban 5 mg bid. Co ox this am decreased to 50.6 2. Pulmonary-on room air. Encourage incentive spirometer. 3. Acute on chronic systolic heart failure-likely related to ischemic cardiomyopathy 4. Anemia-H and H this am 8 and 25.5 5. CBGs 85/82/82. Pre op HGA1C 5.5. Will likely stop accu checks and SS soon 6. ESRD-Creatinine decreased this am decreased to 2.47. On CRRT. Nephrology following. 7. Secondary hyperparathyroidism-per nephrology 8. ID-on Zosyn. WBC decreased to 12,200. No fever last 24 hours.    Donielle Liston Alba PA-C 04/24/2019 7:56 AM    Agree with above Off levo this am.  Remains in low SVR state.On 10 TID of midodrine Continue CRRT, for now Continue ICU care until on HD.  [place]

## 2019-04-24 NOTE — Progress Notes (Signed)
Patient ID: Alex Williams, male   DOB: 1951-01-03, 68 y.o.   MRN: [id number] EVENING ROUNDS NOTE :     [address]       [place] [address]             [phone]                 8 Days Post-Op Procedure(s) (LRB): CORONARY ARTERY BYPASS GRAFTING (CABG)X3  , WITH ENDOSCOPIC HARVESTING OF RIGHT GREATER SAPHENOUS VEIN (N/A) TRANSESOPHAGEAL ECHOCARDIOGRAM (TEE) (N/A)  Total Length of Stay:  LOS: 8 days  BP 113/69 (BP Location: Left Arm)   Pulse 85   Temp (!) 97.4 F (36.3 C) (Oral)   Resp 15   Ht 6' (1.829 m)   Wt 124.2 kg   SpO2 100%   BMI 37.14 kg/m   .Intake/Output      10/01 0701 - 10/02 0700 10/02 0701 - 10/03 0700   P.O. 180 290   I.V. (mL/kg) 675.4 (5.4) 153.2 (1.2)   IV Piggyback 204 49.9   Total Intake(mL/kg) 1059.5 (8.5) 493.1 (4)   Urine (mL/kg/hr) 0 (0) 15 (0)   Other 2760 637   Stool 0 0   Total Output 2760 652   Net -1700.5 -158.9        Stool Occurrence 1 x 3 x     .  prismasol BGK 4/2.5 500 mL/hr at 04/24/19 0528  .  prismasol BGK 4/2.5 300 mL/hr at 04/23/19 1919  . sodium chloride 10 mL/hr at 04/21/19 2209  . sodium chloride Stopped (04/23/19 1146)  . amiodarone 30 mg/hr (04/24/19 1600)  . lactated ringers    . lactated ringers    . lactated ringers Stopped (04/22/19 0900)  . norepinephrine (LEVOPHED) Adult infusion Stopped (04/24/19 0801)  . prismasol BGK 4/2.5 1,500 mL/hr at 04/24/19 1233     Lab Results  Component Value Date   WBC 12.2 (H) 04/24/2019   HGB 8.0 (L) 04/24/2019   HCT 25.5 (L) 04/24/2019   PLT 194 04/24/2019   GLUCOSE 98 04/24/2019   CHOL 201 (H) 04/16/2019   TRIG 178 (H) 04/16/2019   HDL 23 (L) 04/16/2019   LDLDIRECT 155.0 01/20/2009   LDLCALC 142 (H) 04/16/2019   ALT 8 02/05/2019   AST 10 (L) 02/05/2019   NA 137 04/24/2019   K 4.0 04/24/2019   CL 102 04/24/2019   CREATININE 2.73 (H) 04/24/2019   BUN 22 04/24/2019   CO2 26 04/24/2019   TSH 2.840 08/25/2018   PSA 1.28 03/01/2016   INR 1.5 (H)  04/16/2019   HGBA1C 5.5 04/17/2019   Stable day   Grace Isaac MD  Beeper [phone] Office [phone] 04/24/2019 7:00 PM

## 2019-04-25 LAB — CBC WITH DIFFERENTIAL/PLATELET
Abs Immature Granulocytes: 0.32 10*3/uL — ABNORMAL HIGH (ref 0.00–0.07)
Basophils Absolute: 0 10*3/uL (ref 0.0–0.1)
Basophils Relative: 0 %
Eosinophils Absolute: 0.5 10*3/uL (ref 0.0–0.5)
Eosinophils Relative: 4 %
HCT: 25.2 % — ABNORMAL LOW (ref 39.0–52.0)
Hemoglobin: 7.8 g/dL — ABNORMAL LOW (ref 13.0–17.0)
Immature Granulocytes: 3 %
Lymphocytes Relative: 15 %
Lymphs Abs: 1.9 10*3/uL (ref 0.7–4.0)
MCH: 28.6 pg (ref 26.0–34.0)
MCHC: 31 g/dL (ref 30.0–36.0)
MCV: 92.3 fL (ref 80.0–100.0)
Monocytes Absolute: 1.9 10*3/uL — ABNORMAL HIGH (ref 0.1–1.0)
Monocytes Relative: 15 %
Neutro Abs: 8 10*3/uL — ABNORMAL HIGH (ref 1.7–7.7)
Neutrophils Relative %: 63 %
Platelets: 235 10*3/uL (ref 150–400)
RBC: 2.73 MIL/uL — ABNORMAL LOW (ref 4.22–5.81)
RDW: 16.7 % — ABNORMAL HIGH (ref 11.5–15.5)
WBC: 12.8 10*3/uL — ABNORMAL HIGH (ref 4.0–10.5)
nRBC: 0.2 % (ref 0.0–0.2)

## 2019-04-25 LAB — RENAL FUNCTION PANEL
Albumin: 2.3 g/dL — ABNORMAL LOW (ref 3.5–5.0)
Albumin: 2.3 g/dL — ABNORMAL LOW (ref 3.5–5.0)
Anion gap: 11 (ref 5–15)
Anion gap: 11 (ref 5–15)
BUN: 33 mg/dL — ABNORMAL HIGH (ref 8–23)
BUN: 42 mg/dL — ABNORMAL HIGH (ref 8–23)
CO2: 26 mmol/L (ref 22–32)
CO2: 24 mmol/L (ref 22–32)
Calcium: 12.8 mg/dL — ABNORMAL HIGH (ref 8.9–10.3)
Calcium: 13.1 mg/dL (ref 8.9–10.3)
Chloride: 101 mmol/L (ref 98–111)
Chloride: 102 mmol/L (ref 98–111)
Creatinine, Ser: 3.93 mg/dL — ABNORMAL HIGH (ref 0.61–1.24)
Creatinine, Ser: 5.02 mg/dL — ABNORMAL HIGH (ref 0.61–1.24)
GFR calc Af Amer: 17 mL/min — ABNORMAL LOW (ref >60)
GFR calc Af Amer: 13 mL/min — ABNORMAL LOW (ref >60)
GFR calc non Af Amer: 15 mL/min — ABNORMAL LOW (ref >60)
GFR calc non Af Amer: 11 mL/min — ABNORMAL LOW (ref >60)
Glucose, Bld: 98 mg/dL (ref 70–99)
Glucose, Bld: 132 mg/dL — ABNORMAL HIGH (ref 70–99)
Phosphorus: 4.7 mg/dL — ABNORMAL HIGH (ref 2.5–4.6)
Phosphorus: 5.7 mg/dL — ABNORMAL HIGH (ref 2.5–4.6)
Potassium: 4 mmol/L (ref 3.5–5.1)
Potassium: 4.5 mmol/L (ref 3.5–5.1)
Sodium: 138 mmol/L (ref 135–145)
Sodium: 137 mmol/L (ref 135–145)

## 2019-04-25 LAB — MAGNESIUM: Magnesium: 2.5 mg/dL — ABNORMAL HIGH (ref 1.7–2.4)

## 2019-04-25 LAB — COOXEMETRY PANEL
Carboxyhemoglobin: 2.9 % — ABNORMAL HIGH (ref 0.5–1.5)
Methemoglobin: 1.1 % (ref 0.0–1.5)
O2 Saturation: 70.4 %
Total hemoglobin: 7.7 g/dL — ABNORMAL LOW (ref 12.0–16.0)

## 2019-04-25 LAB — APTT: aPTT: 43 seconds — ABNORMAL HIGH (ref 24–36)

## 2019-04-25 MED ORDER — MIDODRINE HCL 5 MG PO TABS
15.0000 mg | ORAL_TABLET | Freq: Three times a day (TID) | ORAL | Status: AC
  Administered 2019-04-25 – 2019-04-27 (×7): 15 mg via ORAL
  Filled 2019-04-25 (×6): qty 3

## 2019-04-25 NOTE — Progress Notes (Signed)
Patient ID: Alex Williams, male   DOB: 05/04/1951, 68 y.o.   MRN: [id number] EVENING ROUNDS NOTE :     [address]       [place] [address]             [phone]                 9 Days Post-Op Procedure(s) (LRB): CORONARY ARTERY BYPASS GRAFTING (CABG)X3  , WITH ENDOSCOPIC HARVESTING OF RIGHT GREATER SAPHENOUS VEIN (N/A) TRANSESOPHAGEAL ECHOCARDIOGRAM (TEE) (N/A)  Total Length of Stay:  LOS: 9 days  BP (!) 97/49   Pulse 80   Temp 98.1 F (36.7 C) (Oral)   Resp 10   Ht 6' (1.829 m)   Wt 124.2 kg   SpO2 97%   BMI 37.14 kg/m   .Intake/Output      10/02 0701 - 10/03 0700 10/03 0701 - 10/04 0700   P.O. 410    I.V. (mL/kg) 411 (3.3) 17.6 (0.1)   IV Piggyback 49.9    Total Intake(mL/kg) 870.9 (7) 17.6 (0.1)   Urine (mL/kg/hr) 43 (0) 1 (0)   Other 637    Stool 0 1   Total Output 680 2   Net +190.9 +15.6        Urine Occurrence  1 x   Stool Occurrence 3 x 1 x     . sodium chloride 10 mL/hr at 04/21/19 2209  . sodium chloride Stopped (04/23/19 1146)  . amiodarone 30 mg/hr (04/25/19 0912)  . lactated ringers    . lactated ringers    . lactated ringers Stopped (04/22/19 0900)  . norepinephrine (LEVOPHED) Adult infusion Stopped (04/25/19 1115)     Lab Results  Component Value Date   WBC 12.8 (H) 04/25/2019   HGB 7.8 (L) 04/25/2019   HCT 25.2 (L) 04/25/2019   PLT 235 04/25/2019   GLUCOSE 132 (H) 04/25/2019   CHOL 201 (H) 04/16/2019   TRIG 178 (H) 04/16/2019   HDL 23 (L) 04/16/2019   LDLDIRECT 155.0 01/20/2009   LDLCALC 142 (H) 04/16/2019   ALT 8 02/05/2019   AST 10 (L) 02/05/2019   NA 137 04/25/2019   K 4.5 04/25/2019   CL 102 04/25/2019   CREATININE 5.02 (H) 04/25/2019   BUN 42 (H) 04/25/2019   CO2 24 04/25/2019   TSH 2.840 08/25/2018   PSA 1.28 03/01/2016   INR 1.5 (H) 04/16/2019   HGBA1C 5.5 04/17/2019   4 stools today Ca elevated    Grace Isaac MD  Beeper [phone] Office [phone] 04/25/2019 6:29 PM

## 2019-04-25 NOTE — Progress Notes (Signed)
Patient ID: Alex Williams, male   DOB: 03-01-1951, 68 y.o.   MRN: [id number] T[facility] DAILY ICU PROGRESS NOTE                   [address]            [place] [id number]          [phone]   9 Days Post-Op Procedure(s) (LRB): CORONARY ARTERY BYPASS GRAFTING (CABG)X3  , WITH ENDOSCOPIC HARVESTING OF RIGHT GREATER SAPHENOUS VEIN (N/A) TRANSESOPHAGEAL ECHOCARDIOGRAM (TEE) (N/A)  Total Length of Stay:  LOS: 9 days   Subjective: Continues to progress from last week  Objective: Vital signs in last 24 hours: Temp:  [97.4 F (36.3 C)-98.5 F (36.9 C)] 97.8 F (36.6 C) (10/03 0838) Pulse Rate:  [38-137] 91 (10/03 1100) Cardiac Rhythm: Normal sinus rhythm;Heart block (10/03 0800) Resp:  [12-29] 25 (10/03 1100) BP: (74-121)/(43-83) 114/57 (10/03 1100) SpO2:  [85 %-100 %] 97 % (10/03 1100)  Filed Weights   04/22/19 0500 04/23/19 0600 04/24/19 0500  Weight: 125.8 kg 125.8 kg 124.2 kg    Weight change:    Hemodynamic parameters for last 24 hours: CVP:  [7 mmHg-8 mmHg] 8 mmHg  Intake/Output from previous day: 10/02 0701 - 10/03 0700 In: 870.9 [P.O.:410; I.V.:411; IV Piggyback:49.9] Out: 680 [Urine:43]  Intake/Output this shift: Total I/O In: 17.6 [I.V.:17.6] Out: -   Current Meds: Scheduled Meds: . sodium chloride   Intravenous Once  . apixaban  5 mg Oral BID  . aspirin EC  81 mg Oral Daily  . B-complex with vitamin C  1 tablet Oral Daily  . bisacodyl  10 mg Oral Daily   Or  . bisacodyl  10 mg Rectal Daily  . Chlorhexidine Gluconate Cloth  6 each Topical Daily  . febuxostat  40 mg Oral QODAY  . fluticasone  2 spray Each Nare Daily  . mouth rinse  15 mL Mouth Rinse BID  . midodrine  15 mg Oral TID WC  . pantoprazole  40 mg Oral Daily  . rosuvastatin  5 mg Oral q1800  . sodium chloride flush  10-40 mL Intracatheter Q12H  . sodium chloride flush  3 mL Intravenous Q12H   Continuous Infusions: .  prismasol BGK 4/2.5 500 mL/hr at 04/24/19 0528  .   prismasol BGK 4/2.5 300 mL/hr at 04/23/19 1919  . sodium chloride 10 mL/hr at 04/21/19 2209  . sodium chloride Stopped (04/23/19 1146)  . amiodarone 30 mg/hr (04/25/19 0912)  . lactated ringers    . lactated ringers    . lactated ringers Stopped (04/22/19 0900)  . norepinephrine (LEVOPHED) Adult infusion Stopped (04/25/19 1115)  . prismasol BGK 4/2.5 1,500 mL/hr at 04/24/19 1233   PRN Meds:.Place/Maintain arterial line **AND** sodium chloride, sodium chloride, heparin, lactated ringers, menthol-cetylpyridinium, metoprolol tartrate, morphine injection, ondansetron (ZOFRAN) IV, oxyCODONE, sodium chloride flush, sodium chloride flush, traMADol  General appearance: alert and cooperative Neurologic: intact Heart: irregularly irregular rhythm Lungs: diminished breath sounds bibasilar Abdomen: soft, non-tender; bowel sounds normal; no masses,  no organomegaly Wound: intact  Lab Results: CBC: Recent Labs    04/24/19 0423 04/25/19 0417  WBC 12.2* 12.8*  HGB 8.0* 7.8*  HCT 25.5* 25.2*  PLT 194 235   BMET:  Recent Labs    04/24/19 1531 04/25/19 0417  NA 137 138  K 4.0 4.0  CL 102 101  CO2 26 26  GLUCOSE 98 98  BUN 22 33*  CREATININE 2.73* 3.93*  CALCIUM 12.0* 12.8*    CMET: Lab Results  Component Value Date   WBC 12.8 (H) 04/25/2019   HGB 7.8 (L) 04/25/2019   HCT 25.2 (L) 04/25/2019   PLT 235 04/25/2019   GLUCOSE 98 04/25/2019   CHOL 201 (H) 04/16/2019   TRIG 178 (H) 04/16/2019   HDL 23 (L) 04/16/2019   LDLDIRECT 155.0 01/20/2009   LDLCALC 142 (H) 04/16/2019   ALT 8 02/05/2019   AST 10 (L) 02/05/2019   NA 138 04/25/2019   K 4.0 04/25/2019   CL 101 04/25/2019   CREATININE 3.93 (H) 04/25/2019   BUN 33 (H) 04/25/2019   CO2 26 04/25/2019   TSH 2.840 08/25/2018   PSA 1.28 03/01/2016   INR 1.5 (H) 04/16/2019   HGBA1C 5.5 04/17/2019      PT/INR: No results for input(s): LABPROT, INR in the last 72 hours. Radiology: No results found.   Assessment/Plan: S/P  Procedure(s) (LRB): CORONARY ARTERY BYPASS GRAFTING (CABG)X3  , WITH ENDOSCOPIC HARVESTING OF RIGHT GREATER SAPHENOUS VEIN (N/A) TRANSESOPHAGEAL ECHOCARDIOGRAM (TEE) (N/A) Mobilize Wbc decreasing now 12.8 Off cvvh    Grace Isaac 04/25/2019 11:57 AM

## 2019-04-25 NOTE — Progress Notes (Signed)
Patient ID: Alex Williams, male   DOB: 11-08-1950, 68 y.o.   MRN: [id number]     Advanced Heart Failure Rounding Note  PCP-Cardiologist: No primary care provider on file.   Subjective:    9/24 S/P CABG x3. Impella in place.  9/27 Impella removed.  10/2 CVVH stopped.   Off CVVH and dobutamine. Still on low dose NE at 4.  Midodrine 10 mg tid.  Feels good, has been up out of bed several times today.  Co-ox 70%.   Hgb 7.7 => 7.5 => 8  => 7.8  He has been in and out of atrial fibrillation, now in atrial fibrillation in 90s, remains on amiodarone 30 mg/hr.     Objective:   Weight Range: 124.2 kg Body mass index is 37.14 kg/m.   Vital Signs:   Temp:  [97.4 F (36.3 C)-98.5 F (36.9 C)] 97.8 F (36.6 C) (10/03 0838) Pulse Rate:  [38-137] 90 (10/03 1000) Resp:  [12-29] 25 (10/03 1000) BP: (74-121)/(43-83) 98/55 (10/03 1000) SpO2:  [85 %-100 %] 94 % (10/03 1000) Last BM Date: 04/25/19  Weight change: Filed Weights   04/22/19 0500 04/23/19 0600 04/24/19 0500  Weight: 125.8 kg 125.8 kg 124.2 kg    Intake/Output:   Intake/Output Summary (Last 24 hours) at 04/25/2019 1105 Last data filed at 04/25/2019 0800 Gross per 24 hour  Intake 648.81 ml  Output 304 ml  Net 344.81 ml      Physical Exam   General: NAD Neck: No JVD, no thyromegaly or thyroid nodule.  Lungs: Clear to auscultation bilaterally with normal respiratory effort. CV: Nondisplaced PMI.  Heart irregular S1/S2, no S3/S4, no murmur.  No peripheral edema.   Abdomen: Soft, nontender, no hepatosplenomegaly, no distention.  Skin: Intact without lesions or rashes.  Neurologic: Alert and oriented x 3.  Psych: Normal affect. Extremities: No clubbing or cyanosis.  HEENT: Normal.    EKG    NSR alternating with AF, currently AF 90s.   Labs    CBC Recent Labs    04/24/19 0423 04/25/19 0417  WBC 12.2* 12.8*  NEUTROABS 7.5 8.0*  HGB 8.0* 7.8*  HCT 25.5* 25.2*  MCV 92.1 92.3  PLT 194 [id number]   Basic Metabolic  Panel Recent Labs    04/24/19 0423 04/24/19 1531 04/25/19 0417  NA 137 137 138  K 3.8 4.0 4.0  CL 101 102 101  CO2 26 26 26   GLUCOSE 98 98 98  BUN 20 22 33*  CREATININE 2.47* 2.73* 3.93*  CALCIUM 11.9* 12.0* 12.8*  MG 2.5*  --  2.5*  PHOS 3.2 3.6 4.7*   Liver Function Tests Recent Labs    04/24/19 1531 04/25/19 0417  ALBUMIN 2.3* 2.3*   No results for input(s): LIPASE, AMYLASE in the last 72 hours. Cardiac Enzymes No results for input(s): CKTOTAL, CKMB, CKMBINDEX, TROPONINI in the last 72 hours.  BNP: BNP (last 3 results) Recent Labs    08/25/18 1746 01/30/19 0819 02/05/19 0951  BNP 513.0* 1,138.2* 1,186.2*    ProBNP (last 3 results) No results for input(s): PROBNP in the last 8760 hours.   D-Dimer No results for input(s): DDIMER in the last 72 hours. Hemoglobin A1C No results for input(s): HGBA1C in the last 72 hours. Fasting Lipid Panel No results for input(s): CHOL, HDL, LDLCALC, TRIG, CHOLHDL, LDLDIRECT in the last 72 hours. Thyroid Function Tests No results for input(s): TSH, T4TOTAL, T3FREE, THYROIDAB in the last 72 hours.  Invalid input(s): FREET3  Other results:  Imaging    No results found.   Medications:     Scheduled Medications: . sodium chloride   Intravenous Once  . apixaban  5 mg Oral BID  . aspirin EC  81 mg Oral Daily  . B-complex with vitamin C  1 tablet Oral Daily  . bisacodyl  10 mg Oral Daily   Or  . bisacodyl  10 mg Rectal Daily  . Chlorhexidine Gluconate Cloth  6 each Topical Daily  . febuxostat  40 mg Oral QODAY  . fluticasone  2 spray Each Nare Daily  . mouth rinse  15 mL Mouth Rinse BID  . midodrine  15 mg Oral TID WC  . pantoprazole  40 mg Oral Daily  . rosuvastatin  5 mg Oral q1800  . sodium chloride flush  10-40 mL Intracatheter Q12H  . sodium chloride flush  3 mL Intravenous Q12H    Infusions: .  prismasol BGK 4/2.5 500 mL/hr at 04/24/19 0528  .  prismasol BGK 4/2.5 300 mL/hr at 04/23/19 1919  .  sodium chloride 10 mL/hr at 04/21/19 2209  . sodium chloride Stopped (04/23/19 1146)  . amiodarone 30 mg/hr (04/25/19 0912)  . lactated ringers    . lactated ringers    . lactated ringers Stopped (04/22/19 0900)  . norepinephrine (LEVOPHED) Adult infusion 4 mcg/min (04/25/19 1000)  . prismasol BGK 4/2.5 1,500 mL/hr at 04/24/19 1233    PRN Medications: Place/Maintain arterial line **AND** sodium chloride, sodium chloride, heparin, lactated ringers, menthol-cetylpyridinium, metoprolol tartrate, morphine injection, ondansetron (ZOFRAN) IV, oxyCODONE, sodium chloride flush, sodium chloride flush, traMADol   Assessment/Plan   1. Shock: Suspect initial cardiogenic shock in setting of atypical atrial flutter with RVR and ischemia from severe left main stenosis. May have developed a bit of sepsis component on 9/25.  Echo 9/25 EF 30%.  BP still marginal and have not been able to wean completely off drips.  He is on norepinephrine 4 and off dobutamine.  - Increase midodrine to 15 mg tid.   - Should be able to stop NE today now that he is off CVVH.  2. CAD: Initially suspected acute anterior MI with occluded mid LAD.  However, suspect this was CTO.  Dr. Angelena Form did angioplasty but unable to restore flow.  Patient had a 90% distal left main stenosis compromising flow in LCx and large diagonal. Suspect this led to ischemia/chest pain when the patient went into rapid atrial flutter prior to admission.  Patient taken urgently for CABG supported by Impella. S/P CABG x3 on 9/24 (unable to graft LAD).  - Statin and ASA  - On eliquis 5 mg twice a day for AF.  - Hold b-blocker for now with shock.  3. Acute on chronic systolic CHF: Echo with EF in 30% range with peri-apical severe hypokinesis.  Suspect ischemic cardiomyopathy.  Elevated filling pressures on RHC. Co-ox 70% today. Volume looks ok on exam.  - Increase midodrine to 15 mg tid and stop norepinephrine.  - Now off CVVH, plan for HD Monday.   4.  Paroxysmal Atrial flutter/fib: He had atypical atrial flutter initially, post-op had atrial fibrillation.  On and off atrial fibrillation last couple days.  - Continue amiodarone gtt today at 30 mg/hr while on NE, probably to po tomorrow.    - On eliquis 5 mg twice a day.  5. ESRD: Recent start on HD.  Nephrology following. CVVH stopped 10/2.  -  HD Monday.  6. ID: Fevers resolved. WBC 32k-> 20->19.7 ->16.8 -> 13.3->12.2 ->  12.8.   - Completed course of Zosyn.  7. Anemia: Hgb 7.7 => 7.5=> 8 => 7.8. Abdomen nontender.  8. Thrombocytopenia: Recovered. HIT negative.    CRITICAL CARE Performed by: Loralie Champagne  Total critical care time: 35 minutes  Critical care time was exclusive of separately billable procedures and treating other patients.  Critical care was necessary to treat or prevent imminent or life-threatening deterioration.  Critical care was time spent personally by me on the following activities: development of treatment plan with patient and/or surrogate as well as nursing, discussions with consultants, evaluation of patient's response to treatment, examination of patient, obtaining history from patient or surrogate, ordering and performing treatments and interventions, ordering and review of laboratory studies, ordering and review of radiographic studies, pulse oximetry and re-evaluation of patient's condition.  Loralie Champagne 04/25/2019 11:05 AM

## 2019-04-25 NOTE — Progress Notes (Signed)
[facility] Progress Note  Subjective: stable, no new c/o.    Vitals:   04/25/19 0850 04/25/19 0900 04/25/19 1000 04/25/19 1100  BP:   (!) 98/55 (!) 114/57  Pulse: 75 (!) 101 90 91  Resp: (!) 24 (!) 27 (!) 25 (!) 25  Temp:      TempSrc:      SpO2: 92% 96% 94% 97%  Weight:      Height:        Inpatient medications: . sodium chloride   Intravenous Once  . apixaban  5 mg Oral BID  . aspirin EC  81 mg Oral Daily  . B-complex with vitamin C  1 tablet Oral Daily  . bisacodyl  10 mg Oral Daily   Or  . bisacodyl  10 mg Rectal Daily  . Chlorhexidine Gluconate Cloth  6 each Topical Daily  . febuxostat  40 mg Oral QODAY  . fluticasone  2 spray Each Nare Daily  . mouth rinse  15 mL Mouth Rinse BID  . midodrine  15 mg Oral TID WC  . pantoprazole  40 mg Oral Daily  . rosuvastatin  5 mg Oral q1800  . sodium chloride flush  10-40 mL Intracatheter Q12H  . sodium chloride flush  3 mL Intravenous Q12H   . sodium chloride 10 mL/hr at 04/21/19 2209  . sodium chloride Stopped (04/23/19 1146)  . amiodarone 30 mg/hr (04/25/19 0912)  . lactated ringers    . lactated ringers    . lactated ringers Stopped (04/22/19 0900)  . norepinephrine (LEVOPHED) Adult infusion Stopped (04/25/19 1115)   Place/Maintain arterial line **AND** sodium chloride, sodium chloride, lactated ringers, menthol-cetylpyridinium, metoprolol tartrate, morphine injection, ondansetron (ZOFRAN) IV, oxyCODONE, sodium chloride flush, sodium chloride flush, traMADol    Exam: General:NAD, comfortable Heart:RRR, s1s2 nl Lungs: clear with decreased breath sound basally, no wheezing Abdomen:soft, Non-tender, non-distended Extremities: no sig lower extremity edema  Dialysis Access: RUE AVF + bruit    Dialysis:  [place] MWF  - new start, had OP HD on 9/21 and 9/23 prior to admission  3h  132kg  300/600  RUE AVF  2/2 bath  Hep none   venofer 100 mg ordered each treatment x 4, Hb 8.5 on 9/21  no ESA order yet  PTH  1366 on 04/15/19   Assessment/ Plan #Cardiogenic shock/STEMI: Status post emergent CABG on 9/24.  SP Impella, removed Sunday. As per heart failure team and CT surgery.  - on low dose levo gtt - midodrine at 10 tid  # ESRD: new start had 2 OP sessions prior to admission. Required CRRT due to shock from 9/25 - 10/2. DC'd yesterday - plan for regular HD Monday  # Anemia: Due to ABLA and CKD: Transfuse blood prn. Hb ~8  # Secondary hyperparathyroidism: hypercalcemia prob immobilization. Vit D has been on hold here.  PTH 1366 on 9/23 and OP Ca++ 11.3 9/23  - cont to follow, low Ca+ bath when on regular HD  # CAD sp CABG x 3 on 9/24  # Afib - on amio drip  # ID - on empiric abx Zosyn, WBC better, 12K and leveling off   Alex Williams 04/25/2019, 1:23 PM  Iron/TIBC/Ferritin/ %Sat    Component Value Date/Time   IRON 137 04/16/2019 2343   IRON 32 (L) 01/26/2019 1100   TIBC 188 (L) 04/16/2019 2343   TIBC 226 (L) 01/26/2019 1100   FERRITIN 310 04/16/2019 2343   FERRITIN 301 01/26/2019 1100   IRONPCTSAT 73 (  H) 04/16/2019 2343   IRONPCTSAT 14 (L) 01/26/2019 1100   Recent Labs  Lab 04/25/19 0417  NA 138  K 4.0  CL 101  CO2 26  GLUCOSE 98  BUN 33*  CREATININE 3.93*  CALCIUM 12.8*  PHOS 4.7*  ALBUMIN 2.3*   No results for input(s): AST, ALT, ALKPHOS, BILITOT, PROT in the last 168 hours. Recent Labs  Lab 04/25/19 0417  WBC 12.8*  HGB 7.8*  HCT 25.2*  PLT 235

## 2019-04-26 LAB — COMPREHENSIVE METABOLIC PANEL
ALT: 31 U/L (ref 0–44)
AST: 22 U/L (ref 15–41)
Albumin: 2.2 g/dL — ABNORMAL LOW (ref 3.5–5.0)
Alkaline Phosphatase: 79 U/L (ref 38–126)
Anion gap: 12 (ref 5–15)
BUN: 53 mg/dL — ABNORMAL HIGH (ref 8–23)
CO2: 24 mmol/L (ref 22–32)
Calcium: 12.9 mg/dL — ABNORMAL HIGH (ref 8.9–10.3)
Chloride: 100 mmol/L (ref 98–111)
Creatinine, Ser: 6 mg/dL — ABNORMAL HIGH (ref 0.61–1.24)
GFR calc Af Amer: 10 mL/min — ABNORMAL LOW (ref >60)
GFR calc non Af Amer: 9 mL/min — ABNORMAL LOW (ref >60)
Glucose, Bld: 97 mg/dL (ref 70–99)
Potassium: 4.3 mmol/L (ref 3.5–5.1)
Sodium: 136 mmol/L (ref 135–145)
Total Bilirubin: 0.9 mg/dL (ref 0.3–1.2)
Total Protein: 5 g/dL — ABNORMAL LOW (ref 6.5–8.1)

## 2019-04-26 LAB — CBC WITH DIFFERENTIAL/PLATELET
Abs Immature Granulocytes: 0.31 10*3/uL — ABNORMAL HIGH (ref 0.00–0.07)
Basophils Absolute: 0.1 10*3/uL (ref 0.0–0.1)
Basophils Relative: 0 %
Eosinophils Absolute: 0.5 10*3/uL (ref 0.0–0.5)
Eosinophils Relative: 4 %
HCT: 24.4 % — ABNORMAL LOW (ref 39.0–52.0)
Hemoglobin: 7.5 g/dL — ABNORMAL LOW (ref 13.0–17.0)
Immature Granulocytes: 2 %
Lymphocytes Relative: 18 %
Lymphs Abs: 2.3 10*3/uL (ref 0.7–4.0)
MCH: 28.4 pg (ref 26.0–34.0)
MCHC: 30.7 g/dL (ref 30.0–36.0)
MCV: 92.4 fL (ref 80.0–100.0)
Monocytes Absolute: 1.6 10*3/uL — ABNORMAL HIGH (ref 0.1–1.0)
Monocytes Relative: 12 %
Neutro Abs: 8.2 10*3/uL — ABNORMAL HIGH (ref 1.7–7.7)
Neutrophils Relative %: 64 %
Platelets: 293 10*3/uL (ref 150–400)
RBC: 2.64 MIL/uL — ABNORMAL LOW (ref 4.22–5.81)
RDW: 16.8 % — ABNORMAL HIGH (ref 11.5–15.5)
WBC: 13 10*3/uL — ABNORMAL HIGH (ref 4.0–10.5)
nRBC: 0.2 % (ref 0.0–0.2)

## 2019-04-26 LAB — RENAL FUNCTION PANEL
Albumin: 2.2 g/dL — ABNORMAL LOW (ref 3.5–5.0)
Anion gap: 13 (ref 5–15)
BUN: 64 mg/dL — ABNORMAL HIGH (ref 8–23)
CO2: 23 mmol/L (ref 22–32)
Calcium: 12.7 mg/dL — ABNORMAL HIGH (ref 8.9–10.3)
Chloride: 99 mmol/L (ref 98–111)
Creatinine, Ser: 6.88 mg/dL — ABNORMAL HIGH (ref 0.61–1.24)
GFR calc Af Amer: 9 mL/min — ABNORMAL LOW (ref >60)
GFR calc non Af Amer: 7 mL/min — ABNORMAL LOW (ref >60)
Glucose, Bld: 93 mg/dL (ref 70–99)
Phosphorus: 6.9 mg/dL — ABNORMAL HIGH (ref 2.5–4.6)
Potassium: 4.6 mmol/L (ref 3.5–5.1)
Sodium: 135 mmol/L (ref 135–145)

## 2019-04-26 LAB — COOXEMETRY PANEL
Carboxyhemoglobin: 2.2 % — ABNORMAL HIGH (ref 0.5–1.5)
Methemoglobin: 0.7 % (ref 0.0–1.5)
O2 Saturation: 63.8 %
Total hemoglobin: 10.3 g/dL — ABNORMAL LOW (ref 12.0–16.0)

## 2019-04-26 LAB — PHOSPHORUS: Phosphorus: 6.4 mg/dL — ABNORMAL HIGH (ref 2.5–4.6)

## 2019-04-26 LAB — APTT: aPTT: 44 seconds — ABNORMAL HIGH (ref 24–36)

## 2019-04-26 LAB — MAGNESIUM: Magnesium: 2.5 mg/dL — ABNORMAL HIGH (ref 1.7–2.4)

## 2019-04-26 MED ORDER — PNEUMOCOCCAL VAC POLYVALENT 25 MCG/0.5ML IJ INJ
0.5000 mL | INJECTION | INTRAMUSCULAR | Status: DC
  Filled 2019-04-26: qty 0.5

## 2019-04-26 MED ORDER — DARBEPOETIN ALFA 60 MCG/0.3ML IJ SOSY
60.0000 ug | PREFILLED_SYRINGE | Freq: Once | INTRAMUSCULAR | Status: AC
  Administered 2019-04-27: 60 ug via SUBCUTANEOUS
  Filled 2019-04-26: qty 0.3

## 2019-04-26 MED ORDER — CHLORHEXIDINE GLUCONATE CLOTH 2 % EX PADS
6.0000 | MEDICATED_PAD | Freq: Every day | CUTANEOUS | Status: DC
  Administered 2019-04-28 – 2019-04-30 (×3): 6 via TOPICAL

## 2019-04-26 MED ORDER — DARBEPOETIN ALFA 60 MCG/0.3ML IJ SOSY: 60.0000 ug | PREFILLED_SYRINGE | INTRAMUSCULAR | Status: DC

## 2019-04-26 MED ORDER — WITCH HAZEL-GLYCERIN EX PADS
MEDICATED_PAD | CUTANEOUS | Status: DC | PRN
  Filled 2019-04-26: qty 100

## 2019-04-26 MED ORDER — INFLUENZA VAC A&B SA ADJ QUAD 0.5 ML IM PRSY
0.5000 mL | PREFILLED_SYRINGE | INTRAMUSCULAR | Status: DC
  Filled 2019-04-26: qty 0.5

## 2019-04-26 MED ORDER — AMIODARONE IV BOLUS ONLY 150 MG/100ML: 150.0000 mg | Freq: Once | INTRAVENOUS | Status: DC

## 2019-04-26 MED ORDER — AMIODARONE HCL 200 MG PO TABS
400.0000 mg | ORAL_TABLET | Freq: Two times a day (BID) | ORAL | Status: DC
  Administered 2019-04-26 – 2019-04-30 (×9): 400 mg via ORAL
  Filled 2019-04-26 (×9): qty 2

## 2019-04-26 MED ORDER — AMIODARONE LOAD VIA INFUSION
150.0000 mg | Freq: Once | INTRAVENOUS | Status: AC
  Administered 2019-04-26: 150 mg via INTRAVENOUS
  Filled 2019-04-26: qty 83.34

## 2019-04-26 NOTE — Plan of Care (Signed)
  Problem: Health Behavior/Discharge Planning: Goal: Ability to manage health-related needs will improve Outcome: Progressing   Problem: Clinical Measurements: Goal: Ability to maintain clinical measurements within normal limits will improve Outcome: Progressing Goal: Will remain free from infection Outcome: Progressing Goal: Diagnostic test results will improve Outcome: Progressing Goal: Cardiovascular complication will be avoided Outcome: Progressing   Problem: Activity: Goal: Risk for activity intolerance will decrease Outcome: Progressing   Problem: Activity: Goal: Risk for activity intolerance will decrease Outcome: Progressing   Problem: Nutrition: Goal: Adequate nutrition will be maintained Outcome: Progressing   Problem: Elimination: Goal: Will not experience complications related to urinary retention Outcome: Progressing   Problem: Safety: Goal: Ability to remain free from injury will improve Outcome: Progressing   Problem: Skin Integrity: Goal: Wound healing without signs and symptoms of infection Outcome: Progressing Goal: Risk for impaired skin integrity will decrease Outcome: Progressing   Problem: Clinical Measurements: Goal: Postoperative complications will be avoided or minimized Outcome: Progressing   Problem: Cardiac: Goal: Will achieve and/or maintain hemodynamic stability Outcome: Progressing   Problem: Activity: Goal: Risk for activity intolerance will decrease Outcome: Progressing   Problem: Education: Goal: Will demonstrate proper wound care and an understanding of methods to prevent future damage Outcome: Progressing   Problem: Activity: Goal: Capacity to carry out activities will improve Outcome: Progressing   Problem: Education: Goal: Ability to demonstrate management of disease process will improve Outcome: Progressing Goal: Ability to verbalize understanding of medication therapies will improve Outcome: Progressing    Problem: Skin Integrity: Goal: Risk for impaired skin integrity will decrease Outcome: Progressing

## 2019-04-26 NOTE — Progress Notes (Addendum)
[facility] Progress Note  Subjective: stable, no new c/o.  Up in chair.   Vitals:   04/26/19 0700 04/26/19 0800 04/26/19 0821 04/26/19 1222  BP: (!) 123/58 (!) 129/53  (!) 115/56  Pulse: 78 83  83  Resp: 10 14  16   Temp:   98 F (36.7 C) 98 F (36.7 C)  TempSrc:   Oral Oral  SpO2: 100% 100%    Weight:      Height:        Inpatient medications: . sodium chloride   Intravenous Once  . amiodarone  400 mg Oral BID  . apixaban  5 mg Oral BID  . aspirin EC  81 mg Oral Daily  . B-complex with vitamin C  1 tablet Oral Daily  . bisacodyl  10 mg Oral Daily   Or  . bisacodyl  10 mg Rectal Daily  . Chlorhexidine Gluconate Cloth  6 each Topical Daily  . febuxostat  40 mg Oral QODAY  . fluticasone  2 spray Each Nare Daily  . mouth rinse  15 mL Mouth Rinse BID  . midodrine  15 mg Oral TID WC  . pantoprazole  40 mg Oral Daily  . rosuvastatin  5 mg Oral q1800  . sodium chloride flush  10-40 mL Intracatheter Q12H  . sodium chloride flush  3 mL Intravenous Q12H   . sodium chloride 10 mL/hr at 04/21/19 2209  . sodium chloride Stopped (04/23/19 1146)  . amiodarone Stopped (04/26/19 0900)  . lactated ringers    . lactated ringers    . lactated ringers Stopped (04/22/19 0900)  . norepinephrine (LEVOPHED) Adult infusion Stopped (04/25/19 1107)   Place/Maintain arterial line **AND** sodium chloride, sodium chloride, lactated ringers, menthol-cetylpyridinium, metoprolol tartrate, morphine injection, ondansetron (ZOFRAN) IV, oxyCODONE, sodium chloride flush, sodium chloride flush, traMADol, witch hazel-glycerin    Exam: General:NAD, comfortable Heart:RRR, s1s2 nl Lungs: clear with decreased breath sound basally, no wheezing Abdomen:soft, Non-tender, non-distended Extremities: no sig lower extremity edema  Dialysis Access: RUE AVF + bruit    Dialysis:  [place] MWF  - new start, had OP HD on 9/21 and 9/23 prior to admission  3h  132kg  300/600  RUE AVF  2/2 bath  Hep none    venofer 100 mg ordered each treatment x 4, Hb 8.5 on 9/21  no ESA order yet  PTH 1366 on 04/15/19   Assessment/ Plan #Cardiogenic shock/STEMI: Status post emergent CABG on 9/24.  SP Impella, removed 04/19/19. As per heart failure team and CT surgery.  - off of pressors, transferring to 2C - midodrine at 10 tid  # ESRD: new start had 2 OP sessions prior to admission. Required CRRT due to shock from 9/25 - 10/2.  AVF used twice at OP HD w/ sig difficulty per the staff.   - 8 kg under dry wt, 124kg - plan for regular HD Monday - use temp cath for now - will need to call [facility] to see Monday for AVF difficulties and possibly convert temp cath to [facility]  # Anemia: Due to ABLA and CKD: Transfuse blood prn. Hb down < 8 will start darbe with 60 ug sq today then 60 ug IV q Mon.  Check fe tibc.   # Secondary hyperparathyroidism: hypercalcemia prob immobilization. Vit D has been on hold here.  PTH 1366 on 9/23 and OP Ca++ 11.3 9/23  - cont to follow - low Ca+ bath when on regular HD  # CAD sp CABG x 3  on 9/24  # Afib - on amio drip  # ID - sp empiric IV abx, dc'd 10/2   Alex Williams 04/26/2019, 3:39 PM  Iron/TIBC/Ferritin/ %Sat    Component Value Date/Time   IRON 137 04/16/2019 2343   IRON 32 (L) 01/26/2019 1100   TIBC 188 (L) 04/16/2019 2343   TIBC 226 (L) 01/26/2019 1100   FERRITIN 310 04/16/2019 2343   FERRITIN 301 01/26/2019 1100   IRONPCTSAT 73 (H) 04/16/2019 2343   IRONPCTSAT 14 (L) 01/26/2019 1100   Recent Labs  Lab 04/26/19 0429  NA 136  K 4.3  CL 100  CO2 24  GLUCOSE 97  BUN 53*  CREATININE 6.00*  CALCIUM 12.9*  PHOS 6.4*  ALBUMIN 2.2*   Recent Labs  Lab 04/26/19 0429  AST 22  ALT 31  ALKPHOS 79  BILITOT 0.9  PROT 5.0*   Recent Labs  Lab 04/26/19 0429  WBC 13.0*  HGB 7.5*  HCT 24.4*  PLT 293

## 2019-04-26 NOTE — Progress Notes (Signed)
Patient ID: Alex Williams, male   DOB: 1951-02-11, 68 y.o.   MRN: [id number] TCTS DAILY ICU PROGRESS NOTE                   [address]            [place] [id number]          [phone]   10 Days Post-Op Procedure(s) (LRB): CORONARY ARTERY BYPASS GRAFTING (CABG)X3  , WITH ENDOSCOPIC HARVESTING OF RIGHT GREATER SAPHENOUS VEIN (N/A) TRANSESOPHAGEAL ECHOCARDIOGRAM (TEE) (N/A)  Total Length of Stay:  LOS: 10 days   Subjective: Stable this am  Objective: Vital signs in last 24 hours: Temp:  [97.9 F (36.6 C)-98.2 F (36.8 C)] 98 F (36.7 C) (10/04 0821) Pulse Rate:  [69-99] 83 (10/04 0800) Cardiac Rhythm: Normal sinus rhythm;Heart block (10/04 0800) Resp:  [7-27] 14 (10/04 0800) BP: (66-140)/(41-76) 129/53 (10/04 0800) SpO2:  [91 %-100 %] 100 % (10/04 0800) Weight:  [124.5 kg] 124.5 kg (10/04 0500)  Filed Weights   04/23/19 0600 04/24/19 0500 04/26/19 0500  Weight: 125.8 kg 124.2 kg 124.5 kg    Weight change:    Hemodynamic parameters for last 24 hours:    Intake/Output from previous day: 10/03 0701 - 10/04 0700 In: 891 [P.O.:480; I.V.:411] Out: 32 [Urine:31; Stool:1]  Intake/Output this shift: Total I/O In: 136.7 [P.O.:120; I.V.:16.7] Out: 1 [Stool:1]  Current Meds: Scheduled Meds: . sodium chloride   Intravenous Once  . amiodarone  150 mg Intravenous Once  . amiodarone  400 mg Oral BID  . apixaban  5 mg Oral BID  . aspirin EC  81 mg Oral Daily  . B-complex with vitamin C  1 tablet Oral Daily  . bisacodyl  10 mg Oral Daily   Or  . bisacodyl  10 mg Rectal Daily  . Chlorhexidine Gluconate Cloth  6 each Topical Daily  . febuxostat  40 mg Oral QODAY  . fluticasone  2 spray Each Nare Daily  . mouth rinse  15 mL Mouth Rinse BID  . midodrine  15 mg Oral TID WC  . pantoprazole  40 mg Oral Daily  . rosuvastatin  5 mg Oral q1800  . sodium chloride flush  10-40 mL Intracatheter Q12H  . sodium chloride flush  3 mL Intravenous Q12H   Continuous  Infusions: . sodium chloride 10 mL/hr at 04/21/19 2209  . sodium chloride Stopped (04/23/19 1146)  . amiodarone Stopped (04/26/19 0900)  . lactated ringers    . lactated ringers    . lactated ringers Stopped (04/22/19 0900)  . norepinephrine (LEVOPHED) Adult infusion Stopped (04/25/19 1107)   PRN Meds:.Place/Maintain arterial line **AND** sodium chloride, sodium chloride, lactated ringers, menthol-cetylpyridinium, metoprolol tartrate, morphine injection, ondansetron (ZOFRAN) IV, oxyCODONE, sodium chloride flush, sodium chloride flush, traMADol  General appearance: alert, cooperative and no distress Neurologic: intact Heart: regular rate and rhythm, S1, S2 normal, no murmur, click, rub or gallop Lungs: diminished breath sounds bibasilar Abdomen: soft, non-tender; bowel sounds normal; no masses,  no organomegaly Wound: sternum intact  Lab Results: CBC: Recent Labs    04/25/19 0417 04/26/19 0429  WBC 12.8* 13.0*  HGB 7.8* 7.5*  HCT 25.2* 24.4*  PLT 235 293   BMET:  Recent Labs    04/25/19 1745 04/26/19 0429  NA 137 136  K 4.5 4.3  CL 102 100  CO2 24 24  GLUCOSE 132* 97  BUN 42* 53*  CREATININE 5.02* 6.00*  CALCIUM 13.1* 12.9*  CMET: Lab Results  Component Value Date   WBC 13.0 (H) 04/26/2019   HGB 7.5 (L) 04/26/2019   HCT 24.4 (L) 04/26/2019   PLT 293 04/26/2019   GLUCOSE 97 04/26/2019   CHOL 201 (H) 04/16/2019   TRIG 178 (H) 04/16/2019   HDL 23 (L) 04/16/2019   LDLDIRECT 155.0 01/20/2009   LDLCALC 142 (H) 04/16/2019   ALT 31 04/26/2019   AST 22 04/26/2019   NA 136 04/26/2019   K 4.3 04/26/2019   CL 100 04/26/2019   CREATININE 6.00 (H) 04/26/2019   BUN 53 (H) 04/26/2019   CO2 24 04/26/2019   TSH 2.840 08/25/2018   PSA 1.28 03/01/2016   INR 1.5 (H) 04/16/2019   HGBA1C 5.5 04/17/2019      PT/INR: No results for input(s): LABPROT, INR in the last 72 hours. Radiology: No results found.   Assessment/Plan: S/P Procedure(s) (LRB): CORONARY ARTERY  BYPASS GRAFTING (CABG)X3  , WITH ENDOSCOPIC HARVESTING OF RIGHT GREATER SAPHENOUS VEIN (N/A) TRANSESOPHAGEAL ECHOCARDIOGRAM (TEE) (N/A) Plan for transfer to step-down: see transfer orders orders per hf team 2c Holding sinus changed to po amniodrone  Dialysis tomorrow    Grace Isaac 04/26/2019 10:39 AM

## 2019-04-26 NOTE — Progress Notes (Signed)
Patient ID: Alex Williams, male   DOB: [birth year], 68 y.o.   MRN: [id number]     Advanced Heart Failure Rounding Note  PCP-Cardiologist: No primary care provider on file.   Subjective:    9/24 S/P CABG x3. Impella in place.  9/27 Impella removed.  10/2 CVVH stopped.   Off CVVH, dobutamine, and norepinephrine. Continue midodrine 15 mg tid.  Walked to chair a couple of times yesterday.  Co-ox 64%.   Hgb 7.7 => 7.5 => 8  => 7.8 => 7.5.   He has been in and out of atrial fibrillation, now in NSR in 70s, remains on amiodarone 30 mg/hr.     Objective:   Weight Range: 124.5 kg Body mass index is 37.23 kg/m.   Vital Signs:   Temp:  [97.8 F (36.6 C)-98.2 F (36.8 C)] 98.2 F (36.8 C) (10/04 0400) Pulse Rate:  [69-101] 83 (10/04 0600) Resp:  [7-27] 7 (10/04 0600) BP: (66-140)/(41-76) 126/56 (10/04 0600) SpO2:  [91 %-100 %] 97 % (10/04 0600) Weight:  [124.5 kg] 124.5 kg (10/04 0500) Last BM Date: 04/25/19  Weight change: Filed Weights   04/23/19 0600 04/24/19 0500 04/26/19 0500  Weight: 125.8 kg 124.2 kg 124.5 kg    Intake/Output:   Intake/Output Summary (Last 24 hours) at 04/26/2019 0705 Last data filed at 04/26/2019 0700 Gross per 24 hour  Intake 891.01 ml  Output 32 ml  Net 859.01 ml      Physical Exam   General: NAD Neck: No JVD, no thyromegaly or thyroid nodule.  Lungs: Clear to auscultation bilaterally with normal respiratory effort. CV: Nondisplaced PMI.  Heart regular S1/S2, no S3/S4, no murmur.  No peripheral edema.   Abdomen: Soft, nontender, no hepatosplenomegaly, no distention.  Skin: Intact without lesions or rashes.  Neurologic: Alert and oriented x 3.  Psych: Normal affect. Extremities: No clubbing or cyanosis.  HEENT: Normal.    EKG    NSR alternating with AF, currently NSR 70s.   Labs    CBC Recent Labs    04/25/19 0417 04/26/19 0429  WBC 12.8* 13.0*  NEUTROABS 8.0* 8.2*  HGB 7.8* 7.5*  HCT 25.2* 24.4*  MCV 92.3 92.4  PLT 235 0000000    Basic Metabolic Panel Recent Labs    04/25/19 0417 04/25/19 1745 04/26/19 0429  NA 138 137 136  K 4.0 4.5 4.3  CL 101 102 100  CO2 26 24 24   GLUCOSE 98 132* 97  BUN 33* 42* 53*  CREATININE 3.93* 5.02* 6.00*  CALCIUM 12.8* 13.1* 12.9*  MG 2.5*  --  2.5*  PHOS 4.7* 5.7* 6.4*   Liver Function Tests Recent Labs    04/25/19 1745 04/26/19 0429  AST  --  22  ALT  --  31  ALKPHOS  --  79  BILITOT  --  0.9  PROT  --  5.0*  ALBUMIN 2.3* 2.2*   No results for input(s): LIPASE, AMYLASE in the last 72 hours. Cardiac Enzymes No results for input(s): CKTOTAL, CKMB, CKMBINDEX, TROPONINI in the last 72 hours.  BNP: BNP (last 3 results) Recent Labs    08/25/18 1746 01/30/19 0819 02/05/19 0951  BNP 513.0* 1,138.2* 1,186.2*    ProBNP (last 3 results) No results for input(s): PROBNP in the last 8760 hours.   D-Dimer No results for input(s): DDIMER in the last 72 hours. Hemoglobin A1C No results for input(s): HGBA1C in the last 72 hours. Fasting Lipid Panel No results for input(s): CHOL, HDL, LDLCALC, TRIG, CHOLHDL,  LDLDIRECT in the last 72 hours. Thyroid Function Tests No results for input(s): TSH, T4TOTAL, T3FREE, THYROIDAB in the last 72 hours.  Invalid input(s): FREET3  Other results:   Imaging    No results found.   Medications:     Scheduled Medications: . sodium chloride   Intravenous Once  . amiodarone  400 mg Oral BID  . apixaban  5 mg Oral BID  . aspirin EC  81 mg Oral Daily  . B-complex with vitamin C  1 tablet Oral Daily  . bisacodyl  10 mg Oral Daily   Or  . bisacodyl  10 mg Rectal Daily  . Chlorhexidine Gluconate Cloth  6 each Topical Daily  . febuxostat  40 mg Oral QODAY  . fluticasone  2 spray Each Nare Daily  . mouth rinse  15 mL Mouth Rinse BID  . midodrine  15 mg Oral TID WC  . pantoprazole  40 mg Oral Daily  . rosuvastatin  5 mg Oral q1800  . sodium chloride flush  10-40 mL Intracatheter Q12H  . sodium chloride flush  3 mL  Intravenous Q12H    Infusions: . sodium chloride 10 mL/hr at 04/21/19 2209  . sodium chloride Stopped (04/23/19 1146)  . amiodarone 30 mg/hr (04/26/19 0700)  . amiodarone    . lactated ringers    . lactated ringers    . lactated ringers Stopped (04/22/19 0900)  . norepinephrine (LEVOPHED) Adult infusion Stopped (04/25/19 1107)    PRN Medications: Place/Maintain arterial line **AND** sodium chloride, sodium chloride, lactated ringers, menthol-cetylpyridinium, metoprolol tartrate, morphine injection, ondansetron (ZOFRAN) IV, oxyCODONE, sodium chloride flush, sodium chloride flush, traMADol   Assessment/Plan   1. Shock: Suspect initial cardiogenic shock in setting of atypical atrial flutter with RVR and ischemia from severe left main stenosis. May have developed a bit of sepsis component on 9/25.  Echo 9/25 EF 30%.  He is now off dobutamine and norepinephrine with co-ox 64%. - Continue midodrine 15 mg tid.   2. CAD: Initially suspected acute anterior MI with occluded mid LAD.  However, suspect this was CTO.  Dr. Angelena Form did angioplasty but unable to restore flow.  Patient had a 90% distal left main stenosis compromising flow in LCx and large diagonal. Suspect this led to ischemia/chest pain when the patient went into rapid atrial flutter prior to admission.  Patient taken urgently for CABG supported by Impella. S/P CABG x3 on 9/24 (unable to graft LAD).  - Statin and ASA  - On eliquis 5 mg twice a day for AF.  - Hold b-blocker for now with shock.  3. Acute on chronic systolic CHF: Echo with EF in 30% range with peri-apical severe hypokinesis.  Suspect ischemic cardiomyopathy.  Elevated filling pressures on RHC. Co-ox 64% today. Volume looks ok on exam. He is off NE and dobutamine.  - Continue midodrine 15 mg tid.  - Now off CVVH, plan for HD Monday.   4. Paroxysmal Atrial flutter/fib: He had atypical atrial flutter initially, post-op had atrial fibrillation.  On and off atrial  fibrillation, rate is controlled when he goes in AF.   - Stop amiodarone gtt, start amiodarone 400 mg bid.    - On eliquis 5 mg twice a day.  5. ESRD: Recent start on HD.  Nephrology following. CVVH stopped 10/2.  -  HD Monday.  6. ID: Fevers resolved. WBC 13K.   - Completed course of Zosyn.  7. Anemia: Hgb 7.7 => 7.5=> 8 => 7.8 => 7.5. Abdomen nontender.  -  If Hgb trends down further, will give 1 unit PRBCs with HD tomorrow.  8. Thrombocytopenia: Recovered. HIT negative.    Loralie Champagne 04/26/2019 7:05 AM

## 2019-04-27 LAB — RENAL FUNCTION PANEL
Albumin: 2.1 g/dL — ABNORMAL LOW (ref 3.5–5.0)
Anion gap: 12 (ref 5–15)
BUN: 69 mg/dL — ABNORMAL HIGH (ref 8–23)
CO2: 23 mmol/L (ref 22–32)
Calcium: 12.5 mg/dL — ABNORMAL HIGH (ref 8.9–10.3)
Chloride: 101 mmol/L (ref 98–111)
Creatinine, Ser: 7.58 mg/dL — ABNORMAL HIGH (ref 0.61–1.24)
GFR calc Af Amer: 8 mL/min — ABNORMAL LOW (ref >60)
GFR calc non Af Amer: 7 mL/min — ABNORMAL LOW (ref >60)
Glucose, Bld: 95 mg/dL (ref 70–99)
Phosphorus: 6.9 mg/dL — ABNORMAL HIGH (ref 2.5–4.6)
Potassium: 4.6 mmol/L (ref 3.5–5.1)
Sodium: 136 mmol/L (ref 135–145)

## 2019-04-27 LAB — CBC WITH DIFFERENTIAL/PLATELET
Abs Immature Granulocytes: 0.2 10*3/uL — ABNORMAL HIGH (ref 0.00–0.07)
Basophils Absolute: 0 10*3/uL (ref 0.0–0.1)
Basophils Relative: 0 %
Eosinophils Absolute: 0.4 10*3/uL (ref 0.0–0.5)
Eosinophils Relative: 3 %
HCT: 23.5 % — ABNORMAL LOW (ref 39.0–52.0)
Hemoglobin: 7.4 g/dL — ABNORMAL LOW (ref 13.0–17.0)
Immature Granulocytes: 2 %
Lymphocytes Relative: 15 %
Lymphs Abs: 1.8 10*3/uL (ref 0.7–4.0)
MCH: 28.8 pg (ref 26.0–34.0)
MCHC: 31.5 g/dL (ref 30.0–36.0)
MCV: 91.4 fL (ref 80.0–100.0)
Monocytes Absolute: 1.2 10*3/uL — ABNORMAL HIGH (ref 0.1–1.0)
Monocytes Relative: 10 %
Neutro Abs: 8.5 10*3/uL — ABNORMAL HIGH (ref 1.7–7.7)
Neutrophils Relative %: 70 %
Platelets: 285 10*3/uL (ref 150–400)
RBC: 2.57 MIL/uL — ABNORMAL LOW (ref 4.22–5.81)
RDW: 16.8 % — ABNORMAL HIGH (ref 11.5–15.5)
WBC: 12.2 10*3/uL — ABNORMAL HIGH (ref 4.0–10.5)
nRBC: 0 % (ref 0.0–0.2)

## 2019-04-27 LAB — PREPARE RBC (CROSSMATCH)

## 2019-04-27 LAB — COOXEMETRY PANEL
Carboxyhemoglobin: 2.7 % — ABNORMAL HIGH (ref 0.5–1.5)
Methemoglobin: 0.8 % (ref 0.0–1.5)
O2 Saturation: 74.7 %
Total hemoglobin: 7.7 g/dL — ABNORMAL LOW (ref 12.0–16.0)

## 2019-04-27 LAB — IRON AND TIBC
Iron: 39 ug/dL — ABNORMAL LOW (ref 45–182)
Saturation Ratios: 14 % — ABNORMAL LOW (ref 17.9–39.5)
TIBC: 279 ug/dL (ref 250–450)
UIBC: 240 ug/dL

## 2019-04-27 LAB — HEPATITIS B SURFACE ANTIGEN: Hepatitis B Surface Ag: NONREACTIVE

## 2019-04-27 LAB — MAGNESIUM: Magnesium: 2.7 mg/dL — ABNORMAL HIGH (ref 1.7–2.4)

## 2019-04-27 LAB — APTT: aPTT: 42 seconds — ABNORMAL HIGH (ref 24–36)

## 2019-04-27 LAB — CALCIUM, IONIZED: Calcium, Ionized, Serum: 7.6 mg/dL — ABNORMAL HIGH (ref 4.5–5.6)

## 2019-04-27 MED ORDER — DARBEPOETIN ALFA 60 MCG/0.3ML IJ SOSY
PREFILLED_SYRINGE | INTRAMUSCULAR | Status: AC
  Filled 2019-04-27: qty 0.3

## 2019-04-27 MED ORDER — HEPARIN SODIUM (PORCINE) 1000 UNIT/ML IJ SOLN
INTRAMUSCULAR | Status: AC
  Filled 2019-04-27: qty 3

## 2019-04-27 MED ORDER — SODIUM CHLORIDE 0.9% IV SOLUTION: Freq: Once | INTRAVENOUS | Status: DC

## 2019-04-27 MED ORDER — MIDODRINE HCL 5 MG PO TABS
10.0000 mg | ORAL_TABLET | Freq: Three times a day (TID) | ORAL | Status: DC
  Administered 2019-04-28: 10 mg via ORAL
  Filled 2019-04-27: qty 2

## 2019-04-27 MED ORDER — HYDROCORTISONE (PERIANAL) 2.5 % EX CREA
TOPICAL_CREAM | CUTANEOUS | Status: DC | PRN
  Administered 2019-04-27: 1 via RECTAL
  Administered 2019-04-28: 04:00:00 via RECTAL
  Filled 2019-04-27: qty 28.35

## 2019-04-27 MED ORDER — MIDODRINE HCL 5 MG PO TABS
ORAL_TABLET | ORAL | Status: AC
  Filled 2019-04-27: qty 3

## 2019-04-27 NOTE — Progress Notes (Signed)
    We will place [facility] exchange for temp cath on Wednesday 04/29/2019.  Roxy Horseman PA-C

## 2019-04-27 NOTE — Progress Notes (Signed)
Physical Therapy Treatment Patient Details Name: Alex Williams MRN: [id number] DOB: 1950/08/08 Today's Date: 04/27/2019    History of Present Illness Pt admit with STEMI.  Multiple complications including shock, CHF.  Emergent CABG x 3 on 9/24 with Impella placed 9/24-9/27 for awhile and on CRRT 9/25-10/2.    PT Comments    Patient progressing slowly towards PT goals. Pt complaining of back pain this AM and has a low hemoglobin. Tolerated short distance gait training with Min A-Min guard for balance/safety. Requires Mod of 2 to power to standing from EOB while maintaining sternal precautions. "If I could just use my hands, I could get up." Continued education on sternal precautions. Fatigues quickly and has BLE weakness. Noted to have DOE as well, VSS. Per Rn, planning for a blood transfusion and hopefully this will help endurance and his energy level. Due to slow progress, discharge recommendation updated to CIR.  Will continue to follow.   Follow Up Recommendations  CIR;Supervision/Assistance - 24 hour     Equipment Recommendations  Rolling walker with 5" wheels;3in1 (PT)    Recommendations for Other Services       Precautions / Restrictions Precautions Precautions: Fall;Sternal Precaution Booklet Issued: Yes (comment) Restrictions Weight Bearing Restrictions: Yes(sternal precautions) LUE Partial Weight Bearing Percentage or Pounds: Sternal Precautions    Mobility  Bed Mobility               General bed mobility comments: Sitting EOB upon PT arrival.  Transfers Overall transfer level: Needs assistance Equipment used: Rolling walker (2 wheeled) Transfers: Sit to/from Stand Sit to Stand: Mod assist;+2 physical assistance         General transfer comment: ASsist of 2 to power to standing with cues for use of momentum and hands on knees. Unable to stand without support.  Ambulation/Gait Ambulation/Gait assistance: Min guard;+2 safety/equipment;Min assist Gait  Distance (Feet): 8 Feet Assistive device: Rolling walker (2 wheeled) Gait Pattern/deviations: Step-through pattern;Decreased stride length;Trunk flexed;Wide base of support Gait velocity: decreased   General Gait Details: Slow, unsteady gait with Min guard-Min A for support; BLE weakness. Fatigues quickly. 2/4 DOE. Leaning on forearms of RW after 5' due to fatigue. Sp02 >95% on RA.   Stairs             Wheelchair Mobility    Modified Rankin (Stroke Patients Only)       Balance Overall balance assessment: Needs assistance Sitting-balance support: No upper extremity supported;Feet supported Sitting balance-Leahy Scale: Fair Sitting balance - Comments: Sitting at sink to perform ADLs- declined standing due to back pain and fatigue.   Standing balance support: During functional activity;Bilateral upper extremity supported Standing balance-Leahy Scale: Poor Standing balance comment: relies on UE support for balance                            Cognition Arousal/Alertness: Awake/alert Behavior During Therapy: WFL for tasks assessed/performed Overall Cognitive Status: Within Functional Limits for tasks assessed                                        Exercises      General Comments General comments (skin integrity, edema, etc.): pre activity BP 152/84, post activity BP 134/83.      Pertinent Vitals/Pain Pain Assessment: 0-10 Pain Score: 7  Pain Location: back Pain Descriptors / Indicators: Discomfort;Grimacing;Aching Pain Intervention(s): Repositioned;Monitored during  session;Limited activity within patient's tolerance;Patient requesting pain meds-RN notified    Home Living                      Prior Function            PT Goals (current goals can now be found in the care plan section) Progress towards PT goals: Progressing toward goals(very slowly)    Frequency    Min 3X/week      PT Plan Discharge plan needs to be  updated    Co-evaluation PT/OT/SLP Co-Evaluation/Treatment: Yes Reason for Co-Treatment: For patient/therapist safety;To address functional/ADL transfers PT goals addressed during session: Mobility/safety with mobility;Balance        AM-PAC PT "6 Clicks" Mobility   Outcome Measure  Help needed turning from your back to your side while in a flat bed without using bedrails?: A Little Help needed moving from lying on your back to sitting on the side of a flat bed without using bedrails?: A Little Help needed moving to and from a bed to a chair (including a wheelchair)?: A Lot Help needed standing up from a chair using your arms (e.g., wheelchair or bedside chair)?: A Lot Help needed to walk in hospital room?: A Little Help needed climbing 3-5 steps with a railing? : A Lot 6 Click Score: 15    End of Session Equipment Utilized During Treatment: Gait belt Activity Tolerance: Patient limited by fatigue;Patient limited by pain Patient left: in chair;with call bell/phone within reach;with nursing/sitter in room Nurse Communication: Mobility status PT Visit Diagnosis: Unsteadiness on feet (R26.81);Muscle weakness (generalized) (M62.81);Pain Pain - part of body: (back)     Time: [id number] PT Time Calculation (min) (ACUTE ONLY): 25 min  Charges:  $Therapeutic Activity: 8-22 mins                     Wray Kearns, PT, DPT Acute Rehabilitation Services Pager [phone] Office [place] 04/27/2019, 12:21 PM

## 2019-04-27 NOTE — Evaluation (Signed)
Occupational Therapy Evaluation Patient Details Name: Alex Williams MRN: [id number] DOB: 1951-03-09 Today's Date: 04/27/2019    History of Present Illness Pt admit with STEMI.  Multiple complications including shock, CHF.  Emergent CABG x 3 on 9/24 with Impella placed 9/24-9/27 for awhile and on CRRT 9/25-10/2.   Clinical Impression   PTA patient independent. Admitted for above and limited by problem list below, including generalized weakness, decreased activity tolerance, sternal precautions, and impaired balance.  He currently requires min assist for UB ADLs, mod-max assist +2 for LB ADLs, mod assist +2 for transfers using RW.  He will benefit from continued OT services while admitted and after dc at intensive CIR level in order to maximize independence with ADLs to return to PLOF.     Follow Up Recommendations  CIR    Equipment Recommendations  3 in 1 bedside commode    Recommendations for Other Services Rehab consult     Precautions / Restrictions Precautions Precautions: Fall;Sternal Precaution Booklet Issued: Yes (comment) Precaution Comments: reviewed precautions with patient  Restrictions Weight Bearing Restrictions: Yes LUE Partial Weight Bearing Percentage or Pounds: Sternal Precautions      Mobility Bed Mobility               General bed mobility comments: EOB upon entry   Transfers Overall transfer level: Needs assistance Equipment used: Rolling walker (2 wheeled) Transfers: Sit to/from Stand Sit to Stand: Mod assist;+2 physical assistance         General transfer comment: ASsist of 2 to power to standing with cues for use of momentum and hands on knees. Unable to stand without support.    Balance Overall balance assessment: Needs assistance Sitting-balance support: No upper extremity supported;Feet supported Sitting balance-Leahy Scale: Fair Sitting balance - Comments: Sitting at sink to perform ADLs- declined standing due to back pain and  fatigue.   Standing balance support: During functional activity;Bilateral upper extremity supported Standing balance-Leahy Scale: Poor Standing balance comment: relies on UE support for balance                           ADL either performed or assessed with clinical judgement   ADL Overall ADL's : Needs assistance/impaired     Grooming: Set up;Sitting   Upper Body Bathing: Set up;Sitting   Lower Body Bathing: Minimal assistance;Sit to/from stand;+2 for safety/equipment   Upper Body Dressing : Minimal assistance;Sitting   Lower Body Dressing: Moderate assistance;Sit to/from stand   Toilet Transfer: Ambulation;RW;Moderate assistance;+2 for physical assistance;+2 for safety/equipment Toilet Transfer Details (indicate cue type and reason): simulated to recliner  Toileting- Clothing Manipulation and Hygiene: Minimal assistance;Sit to/from stand       Functional mobility during ADLs: Rolling walker;Minimal assistance;+2 for safety/equipment General ADL Comments: pt limited by decreased activity tolerance, sternal precautions, generalized weakness, and back pain      Vision   Vision Assessment?: No apparent visual deficits     Perception     Praxis      Pertinent Vitals/Pain Pain Assessment: 0-10 Pain Score: 7  Pain Location: back Pain Descriptors / Indicators: Discomfort;Grimacing;Aching Pain Intervention(s): Monitored during session;Repositioned;Patient requesting pain meds-RN notified     Hand Dominance Right   Extremity/Trunk Assessment Upper Extremity Assessment Upper Extremity Assessment: Generalized weakness   Lower Extremity Assessment Lower Extremity Assessment: Defer to PT evaluation   Cervical / Trunk Assessment Cervical / Trunk Assessment: Normal   Communication Communication Communication: No difficulties   Cognition Arousal/Alertness: Awake/alert  Behavior During Therapy: WFL for tasks assessed/performed Overall Cognitive Status:  Within Functional Limits for tasks assessed                                     General Comments  pre activity BP 152/84, post activity BP 134/83.    Exercises     Shoulder Instructions      Home Living Family/patient expects to be discharged to:: Private residence Living Arrangements: Alone Available Help at Discharge: Family;Available 24 hours/day(plans to dc to sisters home ) Type of Home: House Home Access: Stairs to enter [employer] of Steps: 3 Entrance Stairs-Rails: Right;Left;Can reach both Home Layout: One level     Bathroom Shower/Tub: Occupational psychologist: Standard     Home Equipment: None          Prior Functioning/Environment Level of Independence: Independent        Comments: driving , semi retired        OT Problem List: Decreased strength;Decreased activity tolerance;Impaired balance (sitting and/or standing);Decreased knowledge of use of DME or AE;Decreased knowledge of precautions;Pain;Cardiopulmonary status limiting activity      OT Treatment/Interventions: Self-care/ADL training;DME and/or AE instruction;Patient/family education;Balance training;Therapeutic activities;Therapeutic exercise    OT Goals(Current goals can be found in the care plan section) Acute Rehab OT Goals Patient Stated Goal: to go home  OT Goal Formulation: With patient Time For Goal Achievement: 05/11/19 Potential to Achieve Goals: Good  OT Frequency: Min 2X/week   Barriers to D/C:            Co-evaluation PT/OT/SLP Co-Evaluation/Treatment: Yes Reason for Co-Treatment: For patient/therapist safety;To address functional/ADL transfers PT goals addressed during session: Mobility/safety with mobility;Balance OT goals addressed during session: ADL's and self-care      AM-PAC OT "6 Clicks" Daily Activity     Outcome Measure Help from another person eating meals?: None Help from another person taking care of personal grooming?:  A Little Help from another person toileting, which includes using toliet, bedpan, or urinal?: A Lot Help from another person bathing (including washing, rinsing, drying)?: A Lot Help from another person to put on and taking off regular upper body clothing?: A Little Help from another person to put on and taking off regular lower body clothing?: A Lot 6 Click Score: 16   End of Session Equipment Utilized During Treatment: Rolling walker Nurse Communication: Mobility status  Activity Tolerance: Patient tolerated treatment well Patient left: in chair;with call bell/phone within reach;with nursing/sitter in room  OT Visit Diagnosis: Other abnormalities of gait and mobility (R26.89);Muscle weakness (generalized) (M62.81);Pain Pain - part of body: (back)                Time: [id number] OT Time Calculation (min): 25 min Charges:  OT General Charges $OT Visit: 1 Visit OT Evaluation $OT Eval Moderate Complexity: [place], OT Acute Rehabilitation Services Pager [phone] Office [phone]   Delight Stare 04/27/2019, 12:46 PM

## 2019-04-27 NOTE — Care Management Important Message (Signed)
Important Message  Patient Details  Name: Alex Williams MRN: [id number] Date of Birth: 14-Oct-1950   Medicare Important Message Given:  Yes     Memory Argue 04/27/2019, 4:21 PM   DAUGHTER  SIGNED IM FOR DAD

## 2019-04-27 NOTE — Plan of Care (Signed)
  Problem: Clinical Measurements: Goal: Ability to maintain clinical measurements within normal limits will improve Outcome: Progressing   

## 2019-04-27 NOTE — Progress Notes (Signed)
Rehab Admissions Coordinator Note:  Per PT and OT recommendations, this patient was screened by Raechel Ache for appropriateness for an Inpatient Acute Rehab Consult.  At this time, we are recommending Inpatient Rehab consult. AC will contact MD to request order.   Raechel Ache 04/27/2019, 1:01 PM  I can be reached at [phone].

## 2019-04-27 NOTE — Consult Note (Addendum)
[facility] [place] NOTE   MRN : [id number]  Reason for Consult: Malfunctioning AV fistula and now on HD needs [facility] exchange for temp cath. Referring Physician: Dr. Hollie Salk  History of Present Illness: 68 y/o male last seen at our office for post op check right Brachialcephalic av fistula created by Dr. Scot Dock on 01/19/2019.  He was not on HD yet at his last office visit which was on 03/02/2019.  The diameter was > 0.75 mm and the depth was < 0.37 mm over 3/4 the fistula.    He presented to the hospital on 04/16/2019 chest pain and palpitations.  EKG with ST elevation in the precordial leads. Code STEMI was called.   He underwent Emergency CABG X 3 by Dr. Kipp Brood on 04/16/2019.    He had started HD 1 week earlier using the right AV fistula which was cannulated twice, but with difficulty.    Due to pressor needs they placed a temp HD cath and started CRRT post-op.  We have been asked to provide [facility] catheter and work up for right UE AV fistula malfunction.     Past medical history includes: GERD, gout, HTN, HLD    Current Facility-Administered Medications  Medication Dose Route Frequency Provider Last Rate Last Dose  . 0.9 %  sodium chloride infusion (Manually program via Guardrails IV Fluids)   Intravenous Once Larey Dresser, MD      . 0.9 %  sodium chloride infusion (Manually program via Guardrails IV Fluids)   Intravenous Once Larey Dresser, MD      . 0.9 %  sodium chloride infusion   Intra-arterial PRN Larey Dresser, MD 10 mL/hr at 04/21/19 2209    . 0.9 %  sodium chloride infusion   Intravenous PRN Larey Dresser, MD   Stopped at 04/23/19 1146  . amiodarone (PACERONE) tablet 400 mg  400 mg Oral BID Larey Dresser, MD   400 mg at 04/27/19 [id number]  . apixaban (ELIQUIS) tablet 5 mg  5 mg Oral BID Larey Dresser, MD   5 mg at 04/27/19 [id number]  . aspirin EC tablet 81 mg  81 mg Oral Daily Larey Dresser, MD   81 mg at 04/27/19 [id number]  . B-complex with vitamin C  tablet 1 tablet  1 tablet Oral Daily Larey Dresser, MD   1 tablet at 04/27/19 [phone]  . bisacodyl (DULCOLAX) EC tablet 10 mg  10 mg Oral Daily Larey Dresser, MD   10 mg at 04/27/19 [id number]   Or  . bisacodyl (DULCOLAX) suppository 10 mg  10 mg Rectal Daily Larey Dresser, MD      . Chlorhexidine Gluconate Cloth 2 % PADS 6 each  6 each Topical Daily Larey Dresser, MD   6 each at 04/26/19 1056  . Chlorhexidine Gluconate Cloth 2 % PADS 6 each  6 each Topical Q0600 Roney Jaffe, MD      . Derrill Memo ON 05/04/2019] Darbepoetin Alfa (ARANESP) injection 60 mcg  60 mcg Intravenous Q Mon-HD Roney Jaffe, MD      . febuxostat (ULORIC) tablet 40 mg  40 mg Oral Manuela Neptune, MD   40 mg at 04/26/19 1047  . fluticasone (FLONASE) 50 MCG/ACT nasal spray 2 spray  2 spray Each Nare Daily Larey Dresser, MD   2 spray at 04/27/19 0930  . heparin 1000 UNIT/ML injection           . influenza vaccine  adjuvanted (FLUAD) injection 0.5 mL  0.5 mL Intramuscular Tomorrow-1000 Lightfoot, Harrell O, MD      . lactated ringers infusion 500 mL  500 mL Intravenous Once PRN Larey Dresser, MD      . lactated ringers infusion   Intravenous Continuous Larey Dresser, MD      . lactated ringers infusion   Intravenous Continuous Larey Dresser, MD   Stopped at 04/22/19 0900  . MEDLINE mouth rinse  15 mL Mouth Rinse BID Larey Dresser, MD   15 mL at 04/27/19 0401  . menthol-cetylpyridinium (CEPACOL) lozenge 3 mg  1 lozenge Oral PRN Larey Dresser, MD      . metoprolol tartrate (LOPRESSOR) injection 2.5-5 mg  2.5-5 mg Intravenous Q2H PRN Larey Dresser, MD      . midodrine (PROAMATINE) 5 MG tablet           . [START ON 04/28/2019] midodrine (PROAMATINE) tablet 10 mg  10 mg Oral TID WC Larey Dresser, MD      . midodrine (PROAMATINE) tablet 15 mg  15 mg Oral TID WC Larey Dresser, MD   15 mg at 04/27/19 1308  . morphine 2 MG/ML injection 1-4 mg  1-4 mg Intravenous Q1H PRN Larey Dresser, MD   2 mg at  04/25/19 0855  . ondansetron (ZOFRAN) injection 4 mg  4 mg Intravenous Q6H PRN Larey Dresser, MD   4 mg at 04/18/19 1230  . oxyCODONE (Oxy IR/ROXICODONE) immediate release tablet 5 mg  5 mg Oral Q6H PRN Larey Dresser, MD   5 mg at 04/27/19 [id number]  . pantoprazole (PROTONIX) EC tablet 40 mg  40 mg Oral Daily Larey Dresser, MD   40 mg at 04/27/19 [id number]  . pneumococcal 23 valent vaccine (PNU-IMMUNE) injection 0.5 mL  0.5 mL Intramuscular Tomorrow-1000 Lightfoot, Harrell O, MD      . rosuvastatin (CRESTOR) tablet 5 mg  5 mg Oral q1800 Larey Dresser, MD   5 mg at 04/26/19 1755  . sodium chloride flush (NS) 0.9 % injection 10-40 mL  10-40 mL Intracatheter Q12H Larey Dresser, MD   30 mL at 04/27/19 0932  . sodium chloride flush (NS) 0.9 % injection 10-40 mL  10-40 mL Intracatheter PRN Larey Dresser, MD      . sodium chloride flush (NS) 0.9 % injection 3 mL  3 mL Intravenous Q12H Larey Dresser, MD   3 mL at 04/27/19 0401  . sodium chloride flush (NS) 0.9 % injection 3 mL  3 mL Intravenous PRN Larey Dresser, MD      . traMADol Veatrice Bourbon) tablet 50 mg  50 mg Oral Q12H PRN Larey Dresser, MD   50 mg at 04/26/19 2238  . witch hazel-glycerin (TUCKS) pad   Topical PRN Lightfoot, Lucile Crater, MD        Pt meds include: Statin :Yes Betablocker: No ASA: Yes Other anticoagulants/antiplatelets: Plavix  Past Medical History:  Diagnosis Date  . Cervical disc disease   . Chronic kidney disease   . COLONIC POLYPS, HX OF 06/27/2007  . EXOGENOUS OBESITY 01/30/2010  . GERD (gastroesophageal reflux disease)    PMH  . GOUT 01/30/2010  . HYPERCHOLESTEROLEMIA 06/30/2007  . HYPERTENSION 06/27/2007  . LOW BACK PAIN 06/27/2007  . NEPHROLITHIASIS, HX OF 06/27/2007  . OSTEOARTHRITIS 06/27/2007  . SLEEP APNEA, OBSTRUCTIVE, MODERATE 01/27/2009  . Wears dentures    upper    Past Surgical History:  Procedure Laterality Date  . AV FISTULA PLACEMENT Right 01/19/2019   Procedure: RIGHT BRACHIOCEPHALIC  ARTERIOVENOUS (AV) FISTULA CREATION;  Surgeon: Angelia Mould, MD;  Location: [place];  Service: Vascular;  Laterality: Right;  . CARDIAC CATHETERIZATION    . CARPAL TUNNEL RELEASE     left hand  . COLONOSCOPY     with polypectomy  . CORONARY ARTERY BYPASS GRAFT N/A 04/16/2019   Procedure: CORONARY ARTERY BYPASS GRAFTING (CABG)X3  , WITH ENDOSCOPIC HARVESTING OF RIGHT GREATER SAPHENOUS VEIN;  Surgeon: Lajuana Matte, MD;  Location: [place];  Service: Open Heart Surgery;  Laterality: N/A;  . CORONARY/GRAFT ACUTE MI REVASCULARIZATION N/A 04/16/2019   Procedure: CORONARY/GRAFT ACUTE MI REVASCULARIZATION;  Surgeon: Burnell Blanks, MD;  Location: [place] [facility];  Service: Cardiovascular;  Laterality: N/A;  . JOINT REPLACEMENT Left 2005   knee  . LUMBAR LAMINECTOMY    . MULTIPLE TOOTH EXTRACTIONS    . RADIOFREQUENCY ABLATION KIDNEY    . RIGHT/LEFT HEART CATH AND CORONARY ANGIOGRAPHY N/A 04/16/2019   Procedure: RIGHT/LEFT HEART CATH AND CORONARY ANGIOGRAPHY;  Surgeon: Burnell Blanks, MD;  Location: [place] [facility];  Service: Cardiovascular;  Laterality: N/A;  . TEE WITHOUT CARDIOVERSION N/A 04/16/2019   Procedure: TRANSESOPHAGEAL ECHOCARDIOGRAM (TEE);  Surgeon: Lajuana Matte, MD;  Location: [place];  Service: Open Heart Surgery;  Laterality: N/A;  . TOTAL KNEE ARTHROPLASTY     left  . VENTRICULAR ASSIST DEVICE INSERTION N/A 04/16/2019   Procedure: VENTRICULAR ASSIST DEVICE INSERTION;  Surgeon: Burnell Blanks, MD;  Location: [place] [facility];  Service: Cardiovascular;  Laterality: N/A;    Social History Social History   Tobacco Use  . Smoking status: Former Smoker    Quit date: 07/23/1980    Years since quitting: 38.7  . Smokeless tobacco: Never Used  Substance Use Topics  . Alcohol use: Yes    Alcohol/week: 1.0 standard drinks    Types: 1 Cans of beer per week    Comment: rare  . Drug use: No    Family History Family History  Problem  Relation Age of Onset  . Hypertension Other   . Diabetes Sister   . Hyperlipidemia Sister   . Sleep apnea Sister     Allergies  Allergen Reactions  . Codeine Phosphate Other (See Comments)    Hyperactive      REVIEW OF SYSTEMS  General: [ ]  Weight loss, [ ]  Fever, [ ]  chills Neurologic: [ ]  Dizziness, [ ]  Blackouts, [ ]  Seizure [ ]  Stroke, [ ]  "Mini stroke", [ ]  Slurred speech, [ ]  Temporary blindness; [ ]  weakness in arms or legs, [ ]  Hoarseness [ ]  Dysphagia Cardiac: [x ] Chest pain/pressure, [ ]  Shortness of breath at rest [ ]  Shortness of breath with exertion, [ ]  Atrial fibrillation or irregular heartbeat  Vascular: [ ]  Pain in legs with walking, [ ]  Pain in legs at rest, [ ]  Pain in legs at night,  [ ]  Non-healing ulcer, [ ]  Blood clot in vein/DVT,   Pulmonary: [ ]  Home oxygen, [ ]  Productive cough, [ ]  Coughing up blood, [ ]  Asthma,  [ ]  Wheezing [ ]  COPD Musculoskeletal:  [ ]  Arthritis, [ ]  Low back pain, [ ]  Joint pain Hematologic: [ ]  Easy Bruising, [ ]  Anemia; [ ]  Hepatitis Gastrointestinal: [ ]  Blood in stool, [ ]  Gastroesophageal Reflux/heartburn, Urinary: [ ]  chronic Kidney disease, [x ] on HD - [x ] MWF or [ ]   TTHS, [ ]  Burning with urination, [ ]  Difficulty urinating Skin: [ ]  Rashes, [ ]  Wounds Psychological: [ ]  Anxiety, [ ]  Depression  Physical Examination Vitals:   04/27/19 1144 04/27/19 1200 04/27/19 1230 04/27/19 1317  BP: (!) 161/67 132/64 (!) 102/50 (!) 86/40  Pulse: 86 80 69 67  Resp: 16  17 15   Temp:    97.7 F (36.5 C)  TempSrc:    Oral  SpO2:    100%  Weight:      Height:       Body mass index is 36.6 kg/m.  General:  WDWN in NAD HENT: WNL Eyes: Pupils equal Pulmonary: normal non-labored breathing , without Rales, rhonchi,  wheezing Cardiac: RRR, without  Murmurs, rubs or gallops; No carotid bruits Abdomen: soft, NT, no masses Skin: no rashes, ulcers noted;  no Gangrene , no cellulitis; no open wounds;   Vascular Exam/Pulses:palpable  radial, brachial pulses, good thrill in fistula.   Musculoskeletal: no muscle wasting or atrophy; no edema  Neurologic: A&O X 3; Appropriate Affect ;  SENSATION: normal; MOTOR FUNCTION: 5/5 Symmetric Speech is fluent/normal   Significant Diagnostic Studies: CBC Lab Results  Component Value Date   WBC 12.2 (H) 04/27/2019   HGB 7.4 (L) 04/27/2019   HCT 23.5 (L) 04/27/2019   MCV 91.4 04/27/2019   PLT 285 04/27/2019    BMET    Component Value Date/Time   NA 136 04/27/2019 0315   NA 142 01/30/2019 0819   K 4.6 04/27/2019 0315   CL 101 04/27/2019 0315   CO2 23 04/27/2019 0315   GLUCOSE 95 04/27/2019 0315   BUN 69 (H) 04/27/2019 0315   BUN 84 (HH) 01/30/2019 0819   CREATININE 7.58 (H) 04/27/2019 0315   CREATININE 2.19 (H) 09/23/2015 1159   CALCIUM 12.5 (H) 04/27/2019 0315   GFRNONAA 7 (L) 04/27/2019 0315   GFRNONAA 30 (L) 09/23/2015 1159   GFRAA 8 (L) 04/27/2019 0315   GFRAA 35 (L) 09/23/2015 1159   Estimated Creatinine Clearance: 12.4 mL/min (A) (by C-G formula based on SCr of 7.58 mg/dL (H)).  COAG Lab Results  Component Value Date   INR 1.5 (H) 04/16/2019   INR 1.2 04/16/2019   INR 1.5 05/30/2007     Non-Invasive Vascular Imaging:  Right UE fistula duplex ordered  ASSESSMENT/PLAN:  New HD start 1 week prior with difficult cannulation. MI and CABG x 3, short term CRRT now with temp cath.  I have ordered a fistula duplex to start.  He may need a fistulogram.  He is now off pressors and EKG shows NSR 70-80.   Roxy Horseman 04/27/2019 1:32 PM   Pt is 12 days post op from acute MI and CABG.  Will need a few weeks to recover from this before we consider any interventions on his fistula.    Will place [facility] tomorrow by Dr Butch Penny, MD [facility] [place] Office: [phone] Pager: [phone]

## 2019-04-27 NOTE — TOC Benefit Eligibility Note (Signed)
Transition of Care [facility]) Benefit Eligibility Note    Patient Details  Name: Alex Williams MRN: [id number] Date of Birth: 19-Mar-1951   Medication/Dose: Eliquis  2.5mg  or 5mg  bid is this covered  Covered?: Yes  Tier: 3 Drug  Prescription Coverage Preferred Pharmacy: [facility] Non formulary  Spoke with Person/Company/Phone Number:: [employer]  Co-Pay: 47.00 for 30 day supply  131.00 for 90 day supply  Prior Approval: No  Deductible: Unmet(50.00)  Additional Notes: [facility] is prefered but can use any retail pharmacy    Orbie Pyo Phone Number: 04/27/2019, 10:59 AM

## 2019-04-27 NOTE — Plan of Care (Signed)
  Problem: Health Behavior/Discharge Planning: Goal: Ability to manage health-related needs will improve Outcome: Progressing   Problem: Clinical Measurements: Goal: Ability to maintain clinical measurements within normal limits will improve Outcome: Progressing Goal: Will remain free from infection Outcome: Progressing Goal: Diagnostic test results will improve Outcome: Progressing Goal: Cardiovascular complication will be avoided Outcome: Progressing   Problem: Activity: Goal: Risk for activity intolerance will decrease Outcome: Progressing   Problem: Nutrition: Goal: Adequate nutrition will be maintained Outcome: Progressing   Problem: Elimination: Goal: Will not experience complications related to urinary retention Outcome: Progressing   Problem: Safety: Goal: Ability to remain free from injury will improve Outcome: Progressing   Problem: Skin Integrity: Goal: Risk for impaired skin integrity will decrease Outcome: Progressing   Problem: Education: Goal: Ability to demonstrate management of disease process will improve Outcome: Progressing Goal: Ability to verbalize understanding of medication therapies will improve Outcome: Progressing   Problem: Activity: Goal: Capacity to carry out activities will improve Outcome: Progressing   Problem: Education: Goal: Will demonstrate proper wound care and an understanding of methods to prevent future damage Outcome: Progressing   Problem: Activity: Goal: Risk for activity intolerance will decrease Outcome: Progressing   Problem: Cardiac: Goal: Will achieve and/or maintain hemodynamic stability Outcome: Progressing   Problem: Clinical Measurements: Goal: Postoperative complications will be avoided or minimized Outcome: Progressing   Problem: Skin Integrity: Goal: Wound healing without signs and symptoms of infection Outcome: Progressing Goal: Risk for impaired skin integrity will decrease Outcome: Progressing

## 2019-04-27 NOTE — Progress Notes (Signed)
[facility] Progress Note   Assessment/ Plan:   Dialysis: [place] MWF - new start, had OP HD on 9/21 and 9/23 prior to admission 3h 132kg 300/600 RUE AVF 2/2 bath Hep none venofer 100 mg ordered each treatment x 4,Hb 8.5 on 9/21 noESA order yet PTH 1366 on 04/15/19   Assessment/ Plan #Cardiogenic shock/STEMI: Status post emergent CABG on 9/24. SP Impella, removed 04/19/19. As per heart failure team and CT surgery.  - midodrine at 15 tid  # ESRD: new start had 2 OP sessions prior to admission. Required CRRT due to shock from 9/25 - 10/2.  AVF used twice at OP HD w/ sig difficulty per the staff.   - 8 kg under dry wt, 124kg - plan for regular HD Monday - use temp cath for now - VVS c/s today  # Anemia: Due to ABLA and CKD: Transfuse bloodprn. Hb down < 8 will start darbe with 60 ug sq today then 60 ug IV q Mon.  Check fe tibc.   # Secondary hyperparathyroidism: hypercalcemia prob immobilization. Vit D has been on hold here.  PTH 1366 on 9/23 and OP Ca++ 11.3 9/23  - cont to follow - low Ca+ bath when on regular HD  # CAD sp CABG x 3 on 9/24, AS 81 mg daily  # Afib - on PO amio and eliquis  # ID - sp empiric IV abx, dc'd 10/2   Subjective:    For HD today.  Will need fistulogram.  No complaints today.   Objective:   BP (!) 142/63 (BP Location: Left Leg)   Pulse 90   Temp 98.5 F (36.9 C) (Oral)   Resp 12   Ht 6' (1.829 m)   Wt 127 kg   SpO2 98%   BMI 37.97 kg/m   Physical Exam: Gen: NAD, sitting in bed CVS: irregular, well-healed sternal scar Resp: clear bilaterally Abd: soft, nontender, NABS Ext: trace LE edema ACCESS: RUE AVF weak thrill/ bruit, R nontunneled HD cath  Labs: BMET Recent Labs  Lab 04/24/19 0423 04/24/19 1531 04/25/19 0417 04/25/19 1745 04/26/19 0429 04/26/19 1706 04/27/19 0315  NA 137 137 138 137 136 135 136  K 3.8 4.0 4.0 4.5 4.3 4.6 4.6  CL 101 102 101 102 100 99 101  CO2 26 26 26 24 24 23 23    GLUCOSE 98 98 98 132* 97 93 95  BUN 20 22 33* 42* 53* 64* 69*  CREATININE 2.47* 2.73* 3.93* 5.02* 6.00* 6.88* 7.58*  CALCIUM 11.9* 12.0* 12.8* 13.1* 12.9* 12.7* 12.5*  PHOS 3.2 3.6 4.7* 5.7* 6.4* 6.9* 6.9*   CBC Recent Labs  Lab 04/24/19 0423 04/25/19 0417 04/26/19 0429 04/27/19 0314  WBC 12.2* 12.8* 13.0* 12.2*  NEUTROABS 7.5 8.0* 8.2* 8.5*  HGB 8.0* 7.8* 7.5* 7.4*  HCT 25.5* 25.2* 24.4* 23.5*  MCV 92.1 92.3 92.4 91.4  PLT 194 235 293 285    @IMGRELPRIORS @ Medications:    . sodium chloride   Intravenous Once  . sodium chloride   Intravenous Once  . amiodarone  400 mg Oral BID  . apixaban  5 mg Oral BID  . aspirin EC  81 mg Oral Daily  . B-complex with vitamin C  1 tablet Oral Daily  . bisacodyl  10 mg Oral Daily   Or  . bisacodyl  10 mg Rectal Daily  . Chlorhexidine Gluconate Cloth  6 each Topical Daily  . Chlorhexidine Gluconate Cloth  6 each Topical Q0600  . darbepoetin (  ARANESP) injection - NON-DIALYSIS  60 mcg Subcutaneous Once  . [START ON 05/04/2019] darbepoetin (ARANESP) injection - DIALYSIS  60 mcg Intravenous Q Mon-HD  . febuxostat  40 mg Oral QODAY  . fluticasone  2 spray Each Nare Daily  . influenza vaccine adjuvanted  0.5 mL Intramuscular Tomorrow-1000  . mouth rinse  15 mL Mouth Rinse BID  . midodrine  15 mg Oral TID WC  . pantoprazole  40 mg Oral Daily  . pneumococcal 23 valent vaccine  0.5 mL Intramuscular Tomorrow-1000  . rosuvastatin  5 mg Oral q1800  . sodium chloride flush  10-40 mL Intracatheter Q12H  . sodium chloride flush  3 mL Intravenous Q12H     Madelon Lips, MD 04/27/2019, 9:52 AM

## 2019-04-27 NOTE — Progress Notes (Signed)
     [address]       [place] [address]             [phone]       Doing well. Transferred to stepdown  Vitals:   04/27/19 0236 04/27/19 0728  BP: (!) 119/56 (!) 142/63  Pulse: 90 90  Resp: 13 12  Temp: 98.2 F (36.8 C) 98.5 F (36.9 C)  SpO2: 99% 98%   Alert NAD RRR,  EWOB Incision clean Trace edema  CBC    Component Value Date/Time   WBC 12.2 (H) 04/27/2019 0314   RBC 2.57 (L) 04/27/2019 0314   HGB 7.4 (L) 04/27/2019 0314   HGB 7.3 (L) 01/26/2019 1100   HCT 23.5 (L) 04/27/2019 0314   HCT 23.2 (L) 01/26/2019 1100   PLT 285 04/27/2019 0314   PLT 267 01/26/2019 1100   MCV 91.4 04/27/2019 0314   MCV 84 01/26/2019 1100   MCH 28.8 04/27/2019 0314   MCHC 31.5 04/27/2019 0314   RDW 16.8 (H) 04/27/2019 0314   RDW 15.2 01/26/2019 1100   LYMPHSABS 1.8 04/27/2019 0314   LYMPHSABS 2.4 08/09/2017 1100   MONOABS 1.2 (H) 04/27/2019 0314   EOSABS 0.4 04/27/2019 0314   EOSABS 0.2 08/09/2017 1100   BASOSABS 0.0 04/27/2019 0314   BASOSABS 0.1 08/09/2017 1100    BMP Latest Ref Rng & Units 04/27/2019 04/26/2019 04/26/2019  Glucose 70 - 99 mg/dL 95 93 97  BUN 8 - 23 mg/dL 69(H) 64(H) 53(H)  Creatinine 0.61 - 1.24 mg/dL 7.58(H) 6.88(H) 6.00(H)  BUN/Creat Ratio 10 - 24 - - -  Sodium 135 - 145 mmol/L 136 135 136  Potassium 3.5 - 5.1 mmol/L 4.6 4.6 4.3  Chloride 98 - 111 mmol/L 101 99 100  CO2 22 - 32 mmol/L 23 23 24   Calcium 8.9 - 10.3 mg/dL 12.5(H) 12.7(H) 12.9(H)   POD 11 s/p emergency CABG Doing well On amio and midodrine.  Beta blocker not started due to low SVR state.  Will discuss with heart failure. On statin Continue PT/OT On HD dispo planning

## 2019-04-27 NOTE — Progress Notes (Addendum)
Patient ID: Alex Williams, male   DOB: 1950-12-19, 68 y.o.   MRN: [id number]     Advanced Heart Failure Rounding Note  PCP-Cardiologist: No primary care provider on file.   Subjective:    9/24 S/P CABG x3. Impella in place.  9/27 Impella removed.  10/2 CVVH stopped.   CO-OX stable off all drips.   Denies SOB. Ongoing chest soreness.   Objective:   Weight Range: 127 kg Body mass index is 37.97 kg/m.   Vital Signs:   Temp:  [97.8 F (36.6 C)-98.5 F (36.9 C)] 98.5 F (36.9 C) (10/05 0728) Pulse Rate:  [81-90] 90 (10/05 0728) Resp:  [12-21] 12 (10/05 0728) BP: (91-142)/(49-63) 142/63 (10/05 0728) SpO2:  [96 %-100 %] 98 % (10/05 0728) Weight:  [127 kg] 127 kg (10/05 0236) Last BM Date: 04/26/19  Weight change: Filed Weights   04/24/19 0500 04/26/19 0500 04/27/19 0236  Weight: 124.2 kg 124.5 kg 127 kg    Intake/Output:   Intake/Output Summary (Last 24 hours) at 04/27/2019 0733 Last data filed at 04/27/2019 0300 Gross per 24 hour  Intake 698.69 ml  Output 2 ml  Net 696.69 ml      Physical Exam  CVP 4-5  General:  In bed.  No resp difficulty HEENT: normal Neck: supple. no JVD. Carotids 2+ bilat; no bruits. No lymphadenopathy or thryomegaly appreciated. Cor: PMI nondisplaced. Regular rate & rhythm. No rubs, gallops or murmurs. Sternal incision approximated.  Lungs: clear Abdomen: soft, nontender, nondistended. No hepatosplenomegaly. No bruits or masses. Good bowel sounds. Extremities: no cyanosis, clubbing, rash, edema Neuro: alert & orientedx3, cranial nerves grossly intact. moves all 4 extremities w/o difficulty. Affect pleasant   EKG    NSR 70-80s   Labs    CBC Recent Labs    04/26/19 0429 04/27/19 0314  WBC 13.0* 12.2*  NEUTROABS 8.2* 8.5*  HGB 7.5* 7.4*  HCT 24.4* 23.5*  MCV 92.4 91.4  PLT 293 [id number]   Basic Metabolic Panel Recent Labs    04/26/19 0429 04/26/19 1706 04/27/19 0314 04/27/19 0315  NA 136 135  --  136  K 4.3 4.6  --  4.6   CL 100 99  --  101  CO2 24 23  --  23  GLUCOSE 97 93  --  95  BUN 53* 64*  --  69*  CREATININE 6.00* 6.88*  --  7.58*  CALCIUM 12.9* 12.7*  --  12.5*  MG 2.5*  --  2.7*  --   PHOS 6.4* 6.9*  --  6.9*   Liver Function Tests Recent Labs    04/26/19 0429 04/26/19 1706 04/27/19 0315  AST 22  --   --   ALT 31  --   --   ALKPHOS 79  --   --   BILITOT 0.9  --   --   PROT 5.0*  --   --   ALBUMIN 2.2* 2.2* 2.1*   No results for input(s): LIPASE, AMYLASE in the last 72 hours. Cardiac Enzymes No results for input(s): CKTOTAL, CKMB, CKMBINDEX, TROPONINI in the last 72 hours.  BNP: BNP (last 3 results) Recent Labs    08/25/18 1746 01/30/19 0819 02/05/19 0951  BNP 513.0* 1,138.2* 1,186.2*    ProBNP (last 3 results) No results for input(s): PROBNP in the last 8760 hours.   D-Dimer No results for input(s): DDIMER in the last 72 hours. Hemoglobin A1C No results for input(s): HGBA1C in the last 72 hours. Fasting Lipid Panel No results  for input(s): CHOL, HDL, LDLCALC, TRIG, CHOLHDL, LDLDIRECT in the last 72 hours. Thyroid Function Tests No results for input(s): TSH, T4TOTAL, T3FREE, THYROIDAB in the last 72 hours.  Invalid input(s): FREET3  Other results:   Imaging    No results found.   Medications:     Scheduled Medications: . sodium chloride   Intravenous Once  . amiodarone  400 mg Oral BID  . apixaban  5 mg Oral BID  . aspirin EC  81 mg Oral Daily  . B-complex with vitamin C  1 tablet Oral Daily  . bisacodyl  10 mg Oral Daily   Or  . bisacodyl  10 mg Rectal Daily  . Chlorhexidine Gluconate Cloth  6 each Topical Daily  . Chlorhexidine Gluconate Cloth  6 each Topical Q0600  . darbepoetin (ARANESP) injection - NON-DIALYSIS  60 mcg Subcutaneous Once  . [START ON 05/04/2019] darbepoetin (ARANESP) injection - DIALYSIS  60 mcg Intravenous Q Mon-HD  . febuxostat  40 mg Oral QODAY  . fluticasone  2 spray Each Nare Daily  . influenza vaccine adjuvanted  0.5 mL  Intramuscular Tomorrow-1000  . mouth rinse  15 mL Mouth Rinse BID  . midodrine  15 mg Oral TID WC  . pantoprazole  40 mg Oral Daily  . pneumococcal 23 valent vaccine  0.5 mL Intramuscular Tomorrow-1000  . rosuvastatin  5 mg Oral q1800  . sodium chloride flush  10-40 mL Intracatheter Q12H  . sodium chloride flush  3 mL Intravenous Q12H    Infusions: . sodium chloride 10 mL/hr at 04/21/19 2209  . sodium chloride Stopped (04/23/19 1146)  . amiodarone Stopped (04/26/19 0900)  . lactated ringers    . lactated ringers    . lactated ringers Stopped (04/22/19 0900)  . norepinephrine (LEVOPHED) Adult infusion Stopped (04/25/19 1107)    PRN Medications: Place/Maintain arterial line **AND** sodium chloride, sodium chloride, lactated ringers, menthol-cetylpyridinium, metoprolol tartrate, morphine injection, ondansetron (ZOFRAN) IV, oxyCODONE, sodium chloride flush, sodium chloride flush, traMADol, witch hazel-glycerin   Assessment/Plan   1. Shock: Suspect initial cardiogenic shock in setting of atypical atrial flutter with RVR and ischemia from severe left main stenosis. May have developed a bit of sepsis omponent on 9/25.  Echo 9/25 EF 30%.  He is now off dobutamine and norepinephrine with co-ox 75%  - Resolved.  - Continue midodrine 15 mg tid.   2. CAD: Initially suspected acute anterior MI with occluded mid LAD.  However, suspect this was CTO.  Dr. Angelena Form did angioplasty but unable to restore flow.  Patient had a 90% distal left main stenosis compromising flow in LCx and large diagonal. Suspect this led to ischemia/chest pain when the patient went into rapid atrial flutter prior to admission.  Patient taken urgently for CABG supported by Impella. S/P CABG x3 on 9/24 (unable to graft LAD).  - Statin and ASA  - On eliquis 5 mg twice a day for AF.  - Hold b-blocker for now with shock.  3. Acute on chronic systolic CHF: Echo with EF in 30% range with peri-apical severe hypokinesis.  Suspect  ischemic cardiomyopathy.  Elevated filling pressures on RHC.  CO-OX 75%. Off all drips.  CVP 4.   - Continue midodrine 15 mg tid.  - Now off CVVH, plan for HD today    4. Paroxysmal Atrial flutter/fib: He had atypical atrial flutter initially, post-op had atrial fibrillation.  On and off atrial fibrillation, rate is controlled when he goes in AF.   - In NSR  -  Continue  amiodarone 400 mg bid.    - On eliquis 5 mg twice a day.  5. ESRD: Recent start on HD.  Nephrology following. CVVH stopped 10/2.  -  HD today  6. ID: Fevers resolved. WBC 12.2   - Completed course of Zosyn.  7. Anemia: Hgb 7.7 => 7.5=> 8 => 7.8 => 7.5.=>7.4  Abdomen nontender.  - If Hgb trends down further, will give 1 unit PRBCs with HD today   8. Thrombocytopenia: Recovered. HIT negative.    Mobilize.   Amy Clegg NP-C  04/27/2019 7:33 AM  Patient seen with NP, agree with the above note.   He is doing well today, no complaints.  Has been out of bed, moving around more.  BP stable.  No lightheadedness.   General: NAD Neck: No JVD, no thyromegaly or thyroid nodule.  Lungs: Clear to auscultation bilaterally with normal respiratory effort. CV: Nondisplaced PMI.  Heart regular S1/S2, no S3/S4, no murmur.  No peripheral edema.   Abdomen: Soft, nontender, no hepatosplenomegaly, no distention.  Skin: Intact without lesions or rashes.  Neurologic: Alert and oriented x 3.  Psych: Normal affect. Extremities: No clubbing or cyanosis.  HEENT: Normal.   Plan for HD today.  Will keep midodrine at 15 mg tid today since he will be going for HD.  Will start decreasing midodrine afterwards, down to 10 mg tid after HD.    He remains in NSR, continue po amiodarone.   Slow down-trend in Hgb, 7.4 today.  Will give unit of PRBCs with HD.   Continue to mobilize.   Loralie Champagne 04/27/2019 8:31 AM

## 2019-04-27 NOTE — TOC Progression Note (Signed)
Transition of Care [facility]) - Progression Note    Patient Details  Name: Alex Williams MRN: [id number] Date of Birth: Aug 01, 1950  Transition of [facility]) CM/SW Contact  Zenon Mayo, RN Phone Number: 04/27/2019, 1:10 PM  Clinical Narrative:    NCM informed Staff RN taking  Care of patient that we MD to put in CIR consult.         Expected Discharge Plan and Services                                                 Social Determinants of Health (SDOH) Interventions    Readmission Risk Interventions No flowsheet data found.

## 2019-04-28 ENCOUNTER — Inpatient Hospital Stay (HOSPITAL_COMMUNITY): Payer: Medicare Other

## 2019-04-28 DIAGNOSIS — T82590A Other mechanical complication of surgically created arteriovenous fistula, initial encounter: Secondary | ICD-10-CM

## 2019-04-28 LAB — CBC WITH DIFFERENTIAL/PLATELET
Abs Immature Granulocytes: 0.18 10*3/uL — ABNORMAL HIGH (ref 0.00–0.07)
Basophils Absolute: 0.1 10*3/uL (ref 0.0–0.1)
Basophils Relative: 0 %
Eosinophils Absolute: 0.2 10*3/uL (ref 0.0–0.5)
Eosinophils Relative: 1 %
HCT: 29.3 % — ABNORMAL LOW (ref 39.0–52.0)
Hemoglobin: 8.9 g/dL — ABNORMAL LOW (ref 13.0–17.0)
Immature Granulocytes: 1 %
Lymphocytes Relative: 19 %
Lymphs Abs: 3.5 10*3/uL (ref 0.7–4.0)
MCH: 28.1 pg (ref 26.0–34.0)
MCHC: 30.4 g/dL (ref 30.0–36.0)
MCV: 92.4 fL (ref 80.0–100.0)
Monocytes Absolute: 1.7 10*3/uL — ABNORMAL HIGH (ref 0.1–1.0)
Monocytes Relative: 10 %
Neutro Abs: 12.3 10*3/uL — ABNORMAL HIGH (ref 1.7–7.7)
Neutrophils Relative %: 69 %
Platelets: 402 10*3/uL — ABNORMAL HIGH (ref 150–400)
RBC: 3.17 MIL/uL — ABNORMAL LOW (ref 4.22–5.81)
RDW: 16.6 % — ABNORMAL HIGH (ref 11.5–15.5)
WBC: 17.9 10*3/uL — ABNORMAL HIGH (ref 4.0–10.5)
nRBC: 0 % (ref 0.0–0.2)

## 2019-04-28 LAB — COOXEMETRY PANEL
Carboxyhemoglobin: 1.6 % — ABNORMAL HIGH (ref 0.5–1.5)
Carboxyhemoglobin: 2.2 % — ABNORMAL HIGH (ref 0.5–1.5)
Methemoglobin: 0.9 % (ref 0.0–1.5)
Methemoglobin: 0.8 % (ref 0.0–1.5)
O2 Saturation: 32.1 %
O2 Saturation: 66.7 %
Total hemoglobin: 10 g/dL — ABNORMAL LOW (ref 12.0–16.0)
Total hemoglobin: 11.4 g/dL — ABNORMAL LOW (ref 12.0–16.0)

## 2019-04-28 LAB — BPAM RBC
Blood Product Expiration Date: 202011052359
ISSUE DATE / TIME: 202010051305
Unit Type and Rh: 5100

## 2019-04-28 LAB — TYPE AND SCREEN
ABO/RH(D): O NEG
Antibody Screen: NEGATIVE
Unit division: 0

## 2019-04-28 MED ORDER — MIDODRINE HCL 5 MG PO TABS
5.0000 mg | ORAL_TABLET | Freq: Three times a day (TID) | ORAL | Status: DC
  Administered 2019-04-28 – 2019-04-29 (×3): 5 mg via ORAL
  Filled 2019-04-28 (×3): qty 1

## 2019-04-28 MED ORDER — SODIUM CHLORIDE 0.9 % IV SOLN
125.0000 mg | INTRAVENOUS | Status: DC
  Administered 2019-04-29: 125 mg via INTRAVENOUS
  Filled 2019-04-28: qty 10

## 2019-04-28 NOTE — Consult Note (Signed)
Inpatient Rehab Admissions:  Inpatient Rehab Consult received.  I met with pt at the bedside for rehabilitation assessment. Pt reports [place] prior to admission and recognizes need for rehab prior to returning home. Pt's plan is to discharge to his sister's house once ready with assist from her. Based on pt's multiple complexities and functional decline, feel pt is a great candidate for CIR. Noted pt has plans for procedure tomorrow. Will follow up with pt tomorrow after he has had a chance to review program material.   With his permission, I will reach out to his sister to confirm DC plan that would support a CIR stay.   Will continue to follow.  Jhonnie Garner, OTR/L  Rehab Admissions Coordinator  [phone] 04/28/2019 10:29 AM

## 2019-04-28 NOTE — Progress Notes (Signed)
[id number]       [place] [address]             [phone]      12 Days Post-Op Procedure(s) (LRB): CORONARY ARTERY BYPASS GRAFTING (CABG)X3  , WITH ENDOSCOPIC HARVESTING OF RIGHT GREATER SAPHENOUS VEIN (N/A) TRANSESOPHAGEAL ECHOCARDIOGRAM (TEE) (N/A) Subjective: Some DOE, overall feels like he is progressing well  Objective: Vital signs in last 24 hours: Temp:  [97.7 F (36.5 C)-99 F (37.2 C)] 97.8 F (36.6 C) (10/06 0320) Pulse Rate:  [66-100] 93 (10/06 0320) Cardiac Rhythm: Normal sinus rhythm;Bundle branch block;Heart block (10/06 0320) Resp:  [7-22] 17 (10/06 0320) BP: (86-161)/(36-84) 110/50 (10/06 0320) SpO2:  [93 %-100 %] 100 % (10/06 0320) Weight:  [120.9 kg-124.3 kg] 124.3 kg (10/06 0320)  Hemodynamic parameters for last 24 hours: CVP:  [4 mmHg-10 mmHg] 6 mmHg  Intake/Output from previous day: 10/05 0701 - 10/06 0700 In: 795 [P.O.:480; Blood:315] Out: [id number] [Urine:150] Intake/Output this shift: No intake/output data recorded.  General appearance: alert, cooperative and no distress Heart: irregularly irregular rhythm and soft systolic murmur, faint rub Lungs: minor crackles in right base, mostly clear Abdomen: obese, soft, non-tender Extremities: + LE edema Wound: incis healing well  Lab Results: Recent Labs    04/27/19 0314 04/28/19 0354  WBC 12.2* 17.9*  HGB 7.4* 8.9*  HCT 23.5* 29.3*  PLT 285 402*   BMET:  Recent Labs    04/26/19 1706 04/27/19 0315  NA 135 136  K 4.6 4.6  CL 99 101  CO2 23 23  GLUCOSE 93 95  BUN 64* 69*  CREATININE 6.88* 7.58*  CALCIUM 12.7* 12.5*    PT/INR: No results for input(s): LABPROT, INR in the last 72 hours. ABG    Component Value Date/Time   PHART 7.376 04/20/2019 0515   HCO3 27.4 04/20/2019 0515   TCO2 29 04/20/2019 0515   ACIDBASEDEF 4.0 (H) 04/18/2019 0559   O2SAT 32.1 04/28/2019 0400   CBG (last 3)  No results for input(s): GLUCAP in the last 72 hours.  Meds Scheduled  Meds: . sodium chloride   Intravenous Once  . amiodarone  400 mg Oral BID  . apixaban  5 mg Oral BID  . aspirin EC  81 mg Oral Daily  . B-complex with vitamin C  1 tablet Oral Daily  . bisacodyl  10 mg Oral Daily   Or  . bisacodyl  10 mg Rectal Daily  . Chlorhexidine Gluconate Cloth  6 each Topical Daily  . Chlorhexidine Gluconate Cloth  6 each Topical Q0600  . [START ON 05/04/2019] darbepoetin (ARANESP) injection - DIALYSIS  60 mcg Intravenous Q Mon-HD  . febuxostat  40 mg Oral QODAY  . fluticasone  2 spray Each Nare Daily  . influenza vaccine adjuvanted  0.5 mL Intramuscular Tomorrow-1000  . mouth rinse  15 mL Mouth Rinse BID  . midodrine  10 mg Oral TID WC  . midodrine  15 mg Oral TID WC  . pantoprazole  40 mg Oral Daily  . pneumococcal 23 valent vaccine  0.5 mL Intramuscular Tomorrow-1000  . rosuvastatin  5 mg Oral q1800  . sodium chloride flush  10-40 mL Intracatheter Q12H  . sodium chloride flush  3 mL Intravenous Q12H   Continuous Infusions: . sodium chloride 10 mL/hr at 04/21/19 2209  . sodium chloride Stopped (04/23/19 1146)  . lactated ringers    . lactated ringers    . lactated ringers Stopped (04/22/19 0900)  PRN Meds:.Place/Maintain arterial line **AND** sodium chloride, sodium chloride, hydrocortisone, lactated ringers, menthol-cetylpyridinium, metoprolol tartrate, morphine injection, ondansetron (ZOFRAN) IV, oxyCODONE, sodium chloride flush, sodium chloride flush, traMADol, witch hazel-glycerin  Xrays No results found.  Assessment/Plan: S/P Procedure(s) (LRB): CORONARY ARTERY BYPASS GRAFTING (CABG)X3  , WITH ENDOSCOPIC HARVESTING OF RIGHT GREATER SAPHENOUS VEIN (N/A) TRANSESOPHAGEAL ECHOCARDIOGRAM (TEE) (N/A)  1 conts to progress well overall 2 afib/flutter- conts amiodarone po and eliquis 3 BP quite variable range, on midodrine, AHF team assisting with management. Co-Ox is 32 if accurate- clinically looks better than this.  4 Tmax 99, leukocytosis trend is  worsening with WBC 17K, will recheck CXR, may need further eval based on clinical findings- completed course of zosyn 5 New catheter today per Vasc surgery 6 nephrology managing dialysis/renal issues 7 anemia has improved after transfusion, platelet count slightly elevated 8 conts pulm toilet and rehab- CIR consult requested  LOS: 12 days    John Giovanni PA-C 04/28/2019 Pager [phone]

## 2019-04-28 NOTE — PMR Pre-admission (Signed)
PMR Admission Coordinator Pre-Admission Assessment  Patient: Alex Williams is an 68 y.o., male MRN: [id number] DOB: [birth year] Height: 6' (182.9 cm) Weight: 124.7 kg  Insurance Information HMO: yes    PPO:      PCP:      IPA:      80/20:      OTHER: PRIMARY: U[employer] Medicare      Policy#: [id number]      Subscriber: Patient CM Name: Alex Williams      Phone#: [phone]     Fax#: [phone] Pre-Cert#: [id number]      Employer:  [employer]m [employer]aufmann provided by Alex Williams on 10/8 for admit to CIR. Pt is approved for 7 days starting on 10/8. Next review date is 10/14. Clinical updates are due to (f): [id number] (no CM currently assigned at time of approval). When faxing updates, put "for continued stay review" on coversheet. Benefits:  Phone #: online     Name: [email or web address] Eff. Date: 10/22/2018 - 07/23/2019     Deduct: $0      Out of Pocket Max: $3,600 [phone] met)     Life Max: NA CIR: $295/day co-pay for days 1-5, $0/day co-pay for days 6+      SNF: $0/day co-pay for days 1-20, $160/day co-pay for days 21-43, $0/day co-pay for days 44-100; limited to 100 days/cal yr Outpatient: limited by medical necessity     Co-Pay: $30/visit co-pay Home Health: 100% coverage; limited by medical necessity      Co-Pay: 0% co-insurance DME: 80% coverage     Co-Pay: 20% co-insurance Providers:  SECONDARY:       Policy#:       Subscriber:  CM Name:       Phone#:      Fax#:  Pre-Cert#:       Employer:  Benefits:  Phone #:      Name:  Eff. Date:      Deduct:       Out of Pocket Max:       Life Max:  CIR:       SNF:  Outpatient:      Co-Pay:  Home Health:       Co-Pay:  DME:      Co-Pay:   Medicaid Application Date:       Case Manager:  Disability Application Date:       Case Worker:   The "Data Collection Information Summary" for patients in Inpatient Rehabilitation Facilities with attached "Privacy Act [place] Records" was provided and verbally reviewed with: Patient  Emergency Contact  Information Contact Information    Name Relation Home Work Mobile   Reed,Carol Sister [phone]     Alex Williams   [phone]   Alex Williams Daughter   [phone]      Current Medical History  Patient Admitting Diagnosis: Debility after CABG x3, complicated with Dialysis   History of Present Illness: Alex Williams is a 68 year old male with history of chronic kidney disease with hemodialysis, systolic congestive heart failure, hyperlipidemia, hypertension, low back pain.  Per chart review patient lives alone independent prior to admission.  1 level home 3 steps to entry.  Plans to stay with his sister and assistance as needed on discharge.  Presented 04/16/2019 with chest pain and palpitations.  Echocardiogram with with ejection fraction of 25%.  Suspect cardiogenic shock in setting of atypical atrial flutter with RVR and ischemia.  Cardiac catheterization 90% distal left main stenosis into the LCx, totally occluded mid LAD.  Initial attempts at  angioplasty but unable to restore flow.  Patient underwent emergent medial sternotomy for coronary artery bypass grafting x3 04/16/2019 per Dr. Kipp Brood.  Hospital course pain management.  Hemodialysis ongoing after initially receiving CRRT and patient did receive a tunneled dialysis catheter 04/29/2019 per Dr. Donnetta Hutching.  Acute on chronic anemia 7.4 patient did receive 1 unit packed red blood cells 04/27/2019.  Maintained on Eliquis as well as low-dose aspirin.  Cardiac rate remained controlled patient continues on amiodarone follow-up per cardiology services.  Close monitoring of blood pressure continues on ProAmatine Monday Wednesday Fridays.  Tolerating a regular consistency diet.  Therapy evaluations completed and patient is to be admitted for a comprehensive rehab program.   Patient's medical record from [facility] has been reviewed by the rehabilitation admission coordinator and physician.  Past Medical History  Past Medical  History:  Diagnosis Date  . Cervical disc disease   . Chronic kidney disease   . COLONIC POLYPS, HX OF 06/27/2007  . EXOGENOUS OBESITY 01/30/2010  . GERD (gastroesophageal reflux disease)    PMH  . GOUT 01/30/2010  . HYPERCHOLESTEROLEMIA 06/30/2007  . HYPERTENSION 06/27/2007  . LOW BACK PAIN 06/27/2007  . NEPHROLITHIASIS, HX OF 06/27/2007  . OSTEOARTHRITIS 06/27/2007  . SLEEP APNEA, OBSTRUCTIVE, MODERATE 01/27/2009  . Wears dentures    upper    Family History   family history includes Diabetes in his sister; Hyperlipidemia in his sister; Hypertension in an other family member; Sleep apnea in his sister.  Prior Rehab/Hospitalizations Has the patient had prior rehab or hospitalizations prior to admission? Yes  Has the patient had major surgery during 100 days prior to admission? Yes   Current Medications  Current Facility-Administered Medications:  .  0.9 %  sodium chloride infusion (Manually program via Guardrails IV Fluids), , Intravenous, Once, Larey Dresser, MD .  Place/Maintain arterial line, , , Until Discontinued **AND** 0.9 %  sodium chloride infusion, , Intra-arterial, PRN, Larey Dresser, MD, Last Rate: 10 mL/hr at 04/21/19 2209 .  0.9 %  sodium chloride infusion, , Intravenous, PRN, Larey Dresser, MD, Last Rate: 10 mL/hr at 04/29/19 1156, 500 mL at 04/29/19 1156 .  allopurinol (ZYLOPRIM) tablet 100 mg, 100 mg, Oral, Daily, Larey Dresser, MD, 100 mg at 04/30/19 1001 .  amiodarone (PACERONE) tablet 200 mg, 200 mg, Oral, BID, Larey Dresser, MD .  apixaban [facility]) tablet 5 mg, 5 mg, Oral, BID, Larey Dresser, MD, 5 mg at 04/30/19 1001 .  aspirin EC tablet 81 mg, 81 mg, Oral, Daily, Larey Dresser, MD, 81 mg at 04/30/19 1001 .  B-complex with vitamin C tablet 1 tablet, 1 tablet, Oral, Daily, Larey Dresser, MD, 1 tablet at 04/30/19 1000 .  bisacodyl (DULCOLAX) EC tablet 10 mg, 10 mg, Oral, Daily, 10 mg at 04/27/19 0924 **OR** bisacodyl (DULCOLAX) suppository 10  mg, 10 mg, Rectal, Daily, Larey Dresser, MD .  bisoprolol (ZEBETA) tablet 2.5 mg, 2.5 mg, Oral, Once per day on Sun Tue Thu Sat, McLean, Dalton S, MD, 2.5 mg at 04/30/19 1119 .  Chlorhexidine Gluconate Cloth 2 % PADS 6 each, 6 each, Topical, Daily, Larey Dresser, MD, 6 each at 04/30/19 1122 .  Chlorhexidine Gluconate Cloth 2 % PADS 6 each, 6 each, Topical, Q0600, Roney Jaffe, MD, 6 each at 04/30/19 0524 .  cinacalcet (SENSIPAR) tablet 30 mg, 30 mg, Oral, Q supper, Madelon Lips, MD, 30 mg at 04/29/19 1656 .  [START ON 05/04/2019] Darbepoetin Alfa (ARANESP)  injection 60 mcg, 60 mcg, Intravenous, Q Mon-HD, Roney Jaffe, MD .  ferric gluconate (NULECIT) 125 mg in sodium chloride 0.9 % 100 mL IVPB, 125 mg, Intravenous, Q M,W,F-HD, Madelon Lips, MD, Last Rate: 110 mL/hr at 04/29/19 1819, 125 mg at 04/29/19 1819 .  fluticasone (FLONASE) 50 MCG/ACT nasal spray 2 spray, 2 spray, Each Nare, Daily, Larey Dresser, MD, 2 spray at 04/30/19 1009 .  heparin injection 1,000 Units, 1,000 Units, Intravenous, Q dialysis, Claudia Desanctis, MD, 3,400 Units at 04/29/19 2237 .  hydrocortisone (ANUSOL-HC) 2.5 % rectal cream, , Rectal, PRN, Lightfoot, Harrell O, MD .  influenza vaccine adjuvanted (FLUAD) injection 0.5 mL, 0.5 mL, Intramuscular, Tomorrow-1000, Lightfoot, Harrell O, MD .  lactated ringers infusion 500 mL, 500 mL, Intravenous, Once PRN, Larey Dresser, MD .  lactated ringers infusion, , Intravenous, Continuous, Larey Dresser, MD .  lactated ringers infusion, , Intravenous, Continuous, Larey Dresser, MD, Stopped at 04/22/19 0900 .  MEDLINE mouth rinse, 15 mL, Mouth Rinse, BID, Larey Dresser, MD, 15 mL at 04/30/19 1006 .  menthol-cetylpyridinium (CEPACOL) lozenge 3 mg, 1 lozenge, Oral, PRN, Larey Dresser, MD .  metoprolol tartrate (LOPRESSOR) injection 2.5-5 mg, 2.5-5 mg, Intravenous, Q2H PRN, Larey Dresser, MD .  midodrine (PROAMATINE) tablet 5 mg, 5 mg, Oral, Q M,W,F,  Larey Dresser, MD, 5 mg at 04/29/19 1946 .  morphine 2 MG/ML injection 1-4 mg, 1-4 mg, Intravenous, Q1H PRN, Larey Dresser, MD, 2 mg at 04/25/19 0855 .  ondansetron (ZOFRAN) injection 4 mg, 4 mg, Intravenous, Q6H PRN, Larey Dresser, MD, 4 mg at 04/18/19 1230 .  oxyCODONE (Oxy IR/ROXICODONE) immediate release tablet 5 mg, 5 mg, Oral, Q6H PRN, Larey Dresser, MD, 5 mg at 04/30/19 0522 .  pantoprazole (PROTONIX) EC tablet 40 mg, 40 mg, Oral, Daily, Larey Dresser, MD, 40 mg at 04/30/19 1001 .  pneumococcal 23 valent vaccine (PNU-IMMUNE) injection 0.5 mL, 0.5 mL, Intramuscular, Tomorrow-1000, Lightfoot, Harrell O, MD .  psyllium (HYDROCIL/METAMUCIL) packet 1 packet, 1 packet, Oral, BID, Focht, Jessica L, PA, 1 packet at 04/30/19 1000 .  rosuvastatin (CRESTOR) tablet 10 mg, 10 mg, Oral, q1800, Larey Dresser, MD, 10 mg at 04/29/19 1700 .  sevelamer carbonate (RENVELA) tablet 800 mg, 800 mg, Oral, TID WC, Madelon Lips, MD, 800 mg at 04/30/19 1222 .  sodium chloride flush (NS) 0.9 % injection 10-40 mL, 10-40 mL, Intracatheter, Q12H, Larey Dresser, MD, 10 mL at 04/30/19 1007 .  sodium chloride flush (NS) 0.9 % injection 10-40 mL, 10-40 mL, Intracatheter, PRN, Larey Dresser, MD .  sodium chloride flush (NS) 0.9 % injection 3 mL, 3 mL, Intravenous, Q12H, Larey Dresser, MD, 3 mL at 04/30/19 1224 .  sodium chloride flush (NS) 0.9 % injection 3 mL, 3 mL, Intravenous, PRN, Larey Dresser, MD .  traMADol Veatrice Bourbon) tablet 50 mg, 50 mg, Oral, Q12H PRN, Larey Dresser, MD, 50 mg at 04/30/19 0008 .  witch hazel-glycerin (TUCKS) pad, , Topical, PRN, Lightfoot, Lucile Crater, MD  Patients Current Diet:  Diet Order            Diet Heart Room service appropriate? Yes; Fluid consistency: Thin  Diet effective now              Precautions / Restrictions Precautions Precautions: Fall, Sternal Precaution Booklet Issued: Yes (comment) Precaution Comments: reviewed precautions with patient   Restrictions Weight Bearing Restrictions: Yes RUE Weight Bearing: Non weight  bearing RUE Partial Weight Bearing Percentage or Pounds: sternal LUE Weight Bearing: Non weight bearing LUE Partial Weight Bearing Percentage or Pounds: Sternal Precautions LLE Weight Bearing: Non weight bearing   Has the patient had 2 or more falls or a fall with injury in the past year? No  Prior Activity Level Community (5-7x/wk): very active, still works part-time as Dealer; drove PTA, no AD use for ambulation. Independent PTA  Prior Functional Level Self Care: Did the patient need help bathing, dressing, using the toilet or eating? Independent  Indoor Mobility: Did the patient need assistance with walking from room to room (with or without device)? Independent  Stairs: Did the patient need assistance with internal or external stairs (with or without device)? Independent  Functional Cognition: Did the patient need help planning regular tasks such as shopping or remembering to take medications? Independent  Home Assistive Devices / Equipment Home Assistive Devices/Equipment: None Home Equipment: None  Prior Device Use: Indicate devices/aids used by the patient prior to current illness, exacerbation or injury? None of the above  Current Functional Level Cognition  Overall Cognitive Status: Within Functional Limits for tasks assessed Orientation Level: (P) Oriented X4    Extremity Assessment (includes Sensation/Coordination)  Upper Extremity Assessment: Generalized weakness  Lower Extremity Assessment: Defer to PT evaluation    ADLs  Overall ADL's : Needs assistance/impaired Grooming: Set up, Sitting Upper Body Bathing: Set up, Sitting Lower Body Bathing: Minimal assistance, Sit to/from stand, +2 for safety/equipment Upper Body Dressing : Minimal assistance, Sitting Lower Body Dressing: Moderate assistance, Sit to/from stand Toilet Transfer: Ambulation, RW, Moderate assistance, +2 for  physical assistance, +2 for safety/equipment Toilet Transfer Details (indicate cue type and reason): simulated to recliner  Toileting- Clothing Manipulation and Hygiene: Minimal assistance, Sit to/from stand Functional mobility during ADLs: Rolling walker, Minimal assistance, +2 for safety/equipment General ADL Comments: pt limited by decreased activity tolerance, sternal precautions, generalized weakness, and back pain     Mobility  Overal bed mobility: Needs Assistance Bed Mobility: Rolling, Sidelying to Sit Rolling: Supervision Sidelying to sit: Supervision, HOB elevated General bed mobility comments: Cues to decrease WB thru UEs when coming to EOB    Transfers  Overall transfer level: Needs assistance Equipment used: Rolling walker (2 wheeled) Transfers: Sit to/from Stand Sit to Stand: Min assist Stand pivot transfers: Min assist, +2 safety/equipment General transfer comment: Reliant on momentum and minA to power into standing to RW; able to perform 3x sit<>stand during session, requiring increased assist with fatigue    Ambulation / Gait / Stairs / Wheelchair Mobility  Ambulation/Gait Ambulation/Gait assistance: Herbalist (Feet): 32 Feet(+32) Assistive device: Rolling walker (2 wheeled) Gait Pattern/deviations: Step-through pattern, Decreased stride length, Trunk flexed, Wide base of support General Gait Details: Slow, unsteady gait requiring increased assist to maintain balance with fatigue and BLE buckling noted. 1x prolonged seated rest break to recover between gait trials. SpO2 >90% on RA; DOE up to 3/4. Difficulty gripping RW with L hand due to swelling Gait velocity: Decreased Gait velocity interpretation: <1.31 ft/sec, indicative of household ambulator    Posture / Balance Dynamic Sitting Balance Sitting balance - Comments: Sitting at sink to perform ADLs- declined standing due to back pain and fatigue. Balance Overall balance assessment: Needs  assistance Sitting-balance support: No upper extremity supported, Feet supported Sitting balance-Leahy Scale: Fair Sitting balance - Comments: Sitting at sink to perform ADLs- declined standing due to back pain and fatigue. Standing balance support: During functional activity, Bilateral upper extremity supported  Standing balance-Leahy Scale: Poor Standing balance comment: Heavy reliance on BUE support    Special needs/care consideration BiPAP/CPAP : uses CPAP at night at home CPM : no Continuous Drip IV: lactated ringers infusion Dialysis : yes        Days : MWF Life Vest : no Oxygen : on RA Special Bed : no Trach Size : no Wound Vac (area) : no      Location : no Skin: surgical incision (x2 to chest, right leg- upper medial thigh); ecchymosis to lower mid abdomen and right arm                            Bowel mgmt: last BM: 04/29/2019; continent Bladder mgmt: continent Diabetic mgmt: no Behavioral consideration : no Chemo/radiation : no   Previous Home Environment (from acute therapy documentation) Living Arrangements: Alone Available Help at Discharge: Family, Available 24 hours/day(plans to dc to sisters home ) Type of Home: House Home Layout: One level Home Access: Stairs to enter Entrance Stairs-Rails: Right, Left, Can reach both Entrance Stairs-Number of Steps: 3 Bathroom Shower/Tub: Multimedia programmer: Standard Home Care Services: No  Discharge Living Setting Plans for Discharge Living Setting: Other (Comment)(plan is to stay with sister at her house at [place] for support) Type of Home at Discharge: House Discharge Home Layout: One level Discharge Home Access: Level entry Discharge Bathroom Shower/Tub: Tub/shower unit, Walk-in shower Discharge Bathroom Toilet: Standard Discharge Bathroom Accessibility: Yes How Accessible: Accessible via walker Does the patient have any problems obtaining your medications?: No  Social/Family/Support Systems Patient Roles:  Other (Comment)(part-time Dealer. ) Contact Information: sister [name]): home [phone]  Anticipated Caregiver: sister and brother-in-law Anticipated Caregiver's Contact Information: see above Ability/Limitations of Caregiver: Min A Caregiver Availability: 24/7 Discharge Plan Discussed with Primary Caregiver: Yes Is Caregiver In Agreement with Plan?: Yes Does Caregiver/Family have Issues with Lodging/Transportation while Pt is in Rehab?: No   Designated visitor: Son: Dian Situ  *Please have SW call Terri Piedra (Dialysis Coordinator prior to DC for planning purposes).   Goals/Additional Needs Patient/Family Goal for Rehab: PT/OT: Supervision; SLP: NA Expected length of stay: 7-10 days Cultural Considerations: NA Dietary Needs: heart healthy, thin liquids Equipment Needs: TBD Pt/Family Agrees to Admission and willing to participate: Yes Program Orientation Provided & Reviewed with Pt/Caregiver Including Roles  & Responsibilities: Yes(pt and his sister Arbie Cookey)  Barriers to Discharge: Other (comments)(no barriers currently identified)  Decrease burden of Care through IP rehab admission: NA  Possible need for SNF placement upon discharge: Not anticipated; pt has good family support from his sister and brother-in-law who are retired and are willing and able to physically assist at needed at [place]. Pt plans to stay with his sister who's house is more accessible. Pt has a good prognosis for further progress through CIR.   Patient Condition: I have reviewed medical records from [facility], spoken with RN, and patient and family member (sister). I met with patient at the bedside for inpatient rehabilitation assessment.  Patient will benefit from ongoing PT and OT, can actively participate in 3 hours of therapy a day 5 days of the week, and can make measurable gains during the admission.  Patient will also benefit from the coordinated team approach during an Inpatient Acute  Rehabilitation admission.  The patient will receive intensive therapy as well as Rehabilitation physician, nursing, social worker, and care management interventions.  Due to safety, skin/wound care, disease management, medication  administration, pain management and patient education the patient requires 24 hour a day rehabilitation nursing.  The patient is currently Min/Mod A with mobility and Min to Mod Ax 2 for basic ADLs.  Discharge setting and therapy post discharge at home with home health is anticipated.  Patient has agreed to participate in the Acute Inpatient Rehabilitation Program and will admit 04/30/2019.  Preadmission Screen Completed By:  Raechel Ache, 04/30/2019 4:15 PM ______________________________________________________________________   Discussed status with Dr. Posey Pronto on 04/30/2019 at 4:15PM and received approval for admission today.  Admission Coordinator:  Raechel Ache, OT, time 4:15PM/Date 04/30/2019   Assessment/Plan: Diagnosis: Debility   1. Does the need for close, 24 hr/day Medical supervision in concert with the patient's rehab needs make it unreasonable for this patient to be served in a less intensive setting? Yes  2. Co-Morbidities requiring supervision/potential complications: chronic kidney disease with hemodialysis, systolic congestive heart failure, hyperlipidemia, hypertension, low back pain 3. Due to bladder management, bowel management, safety, skin/wound care, disease management, pain management and patient education, does the patient require 24 hr/day rehab nursing? Yes 4. Does the patient require coordinated care of a physician, rehab nurse, PT (1-2 hrs/day, 5 days/week) and OT (1-2 hrs/day, 5 days/week) to address physical and functional deficits in the context of the above medical diagnosis(es)? Yes Addressing deficits in the following areas: balance, endurance, locomotion, strength, transferring, bathing, dressing, toileting and psychosocial support 5. Can the  patient actively participate in an intensive therapy program of at least 3 hrs of therapy 5 days a week? Potentially 6. The potential for patient to make measurable gains while on inpatient rehab is excellent 7. Anticipated functional outcomes upon discharge from inpatients are: supervision and min assist PT, supervision and min assist OT, n/a SLP 8. Estimated rehab length of stay to reach the above functional goals is: 14-17 days. 9. Anticipated D/C setting: Home 10. Anticipated post D/C treatments: HH therapy and Home excercise program 11. Overall Rehab/Functional Prognosis: good  MD Signature: Delice Lesch, MD, ABPMR

## 2019-04-28 NOTE — Progress Notes (Signed)
[facility] Progress Note   Assessment/ Plan:   Dialysis: [place] MWF - new start, had OP HD on 9/21 and 9/23 prior to admission 3h 132kg 300/600 RUE AVF 2/2 bath Hep none venofer 100 mg ordered each treatment x 4,Hb 8.5 on 9/21 noESA order yet PTH 1366 on 04/15/19   Assessment/ Plan #Cardiogenic shock/STEMI: Status post emergent CABG on 9/24. SP Impella, removed 04/19/19. As per heart failure team and CT surgery.  - midodrine at 15 tid  # ESRD: new start had 2 OP sessions prior to admission. Required CRRT due to shock from 9/25 - 10/2.  AVF used twice at OP HD w/ sig difficulty per the staff.   - 8 kg under dry wt, 124kg - plan for regular HD Monday - use temp cath for now - VVS to place [facility] today, appreciate assistance  # Anemia: Due to ABLA and CKD: Transfuse bloodprn. Hb down < 8 will start darbe with 60 ug sq today then 60 ug IV q Mon.  Check fe tibc--> Tsat 14, start ferrlicet.   # Secondary hyperparathyroidism: hypercalcemia prob immobilization. Vit D has been on hold here.  PTH 1366 on 9/23 and OP Ca++ 11.3 9/23  - cont to follow - low Ca+ bath when on regular HD  # CAD sp CABG x 3 on 9/24, AS 81 mg daily  # Afib - on PO amio and eliquis  # ID - sp empiric IV abx, dc'd 10/2 - WBC up to 17.6 today, CTM, culture if febrile   Subjective:    For [facility] this AM.  Sleeping in recliner.  Tolerated HD yesterday. WBC up to 17.6, no fever/ systemic signs of infection   Objective:   BP (!) 110/50 (BP Location: Left Leg)   Pulse 93   Temp 97.8 F (36.6 C) (Oral)   Resp 17   Ht 6' (1.829 m)   Wt 124.3 kg   SpO2 100%   BMI 37.17 kg/m   Physical Exam: Gen: NAD, sitting in bed CVS: irregular, well-healed sternal scar Resp: clear bilaterally Abd: soft, nontender, NABS Ext: trace LE edema ACCESS: RUE AVF weak thrill/ bruit, R nontunneled HD cath  Labs: BMET Recent Labs  Lab 04/24/19 0423 04/24/19 1531 04/25/19 0417  04/25/19 1745 04/26/19 0429 04/26/19 1706 04/27/19 0315  NA 137 137 138 137 136 135 136  K 3.8 4.0 4.0 4.5 4.3 4.6 4.6  CL 101 102 101 102 100 99 101  CO2 26 26 26 24 24 23 23   GLUCOSE 98 98 98 132* 97 93 95  BUN 20 22 33* 42* 53* 64* 69*  CREATININE 2.47* 2.73* 3.93* 5.02* 6.00* 6.88* 7.58*  CALCIUM 11.9* 12.0* 12.8* 13.1* 12.9* 12.7* 12.5*  PHOS 3.2 3.6 4.7* 5.7* 6.4* 6.9* 6.9*   CBC Recent Labs  Lab 04/25/19 0417 04/26/19 0429 04/27/19 0314 04/28/19 0354  WBC 12.8* 13.0* 12.2* 17.9*  NEUTROABS 8.0* 8.2* 8.5* 12.3*  HGB 7.8* 7.5* 7.4* 8.9*  HCT 25.2* 24.4* 23.5* 29.3*  MCV 92.3 92.4 91.4 92.4  PLT 235 293 285 402*    @IMGRELPRIORS @ Medications:    . sodium chloride   Intravenous Once  . amiodarone  400 mg Oral BID  . apixaban  5 mg Oral BID  . aspirin EC  81 mg Oral Daily  . B-complex with vitamin C  1 tablet Oral Daily  . bisacodyl  10 mg Oral Daily   Or  . bisacodyl  10 mg Rectal Daily  .  Chlorhexidine Gluconate Cloth  6 each Topical Daily  . Chlorhexidine Gluconate Cloth  6 each Topical Q0600  . [START ON 05/04/2019] darbepoetin (ARANESP) injection - DIALYSIS  60 mcg Intravenous Q Mon-HD  . febuxostat  40 mg Oral QODAY  . fluticasone  2 spray Each Nare Daily  . influenza vaccine adjuvanted  0.5 mL Intramuscular Tomorrow-1000  . mouth rinse  15 mL Mouth Rinse BID  . midodrine  5 mg Oral TID WC  . pantoprazole  40 mg Oral Daily  . pneumococcal 23 valent vaccine  0.5 mL Intramuscular Tomorrow-1000  . rosuvastatin  5 mg Oral q1800  . sodium chloride flush  10-40 mL Intracatheter Q12H  . sodium chloride flush  3 mL Intravenous Q12H     Madelon Lips, MD 04/28/2019, 10:15 AM

## 2019-04-28 NOTE — Progress Notes (Signed)
Duplex dialysis access       has been completed. Preliminary results can be found under CV proc through chart review. June Leap, BS, RDMS, RVT

## 2019-04-28 NOTE — Progress Notes (Signed)
Patient ID: Alex Williams, male   DOB: 06/23/1951, 68 y.o.   MRN: [id number]     Advanced Heart Failure Rounding Note  PCP-Cardiologist: No primary care provider on file.   Subjective:    9/24 S/P CABG x3. Impella in place.  9/27 Impella removed.  10/2 CVVH stopped.   He had HD yesterday, got 1 unit PRBCs with appropriate rise in hgb.  BP stable.  Afebrile but WBCs higher at 17.9.    Co-ox low from early am but suspect not accurate.  Was 75% yesterday and he is clinically stable.      Objective:   Weight Range: 124.3 kg Body mass index is 37.17 kg/m.   Vital Signs:   Temp:  [97.7 F (36.5 C)-99 F (37.2 C)] 97.8 F (36.6 C) (10/06 0320) Pulse Rate:  [66-100] 93 (10/06 0320) Resp:  [7-22] 17 (10/06 0320) BP: (86-161)/(36-83) 110/50 (10/06 0320) SpO2:  [93 %-100 %] 100 % (10/06 0320) Weight:  [120.9 kg-124.3 kg] 124.3 kg (10/06 0320) Last BM Date: 04/28/19  Weight change: Filed Weights   04/27/19 1130 04/27/19 1514 04/28/19 0320  Weight: 122.4 kg 120.9 kg 124.3 kg    Intake/Output:   Intake/Output Summary (Last 24 hours) at 04/28/2019 0907 Last data filed at 04/27/2019 1700 Gross per 24 hour  Intake 795 ml  Output 1650 ml  Net -855 ml      Physical Exam  CVP 5  General: NAD Neck: No JVD, no thyromegaly or thyroid nodule.  Lungs: Clear to auscultation bilaterally with normal respiratory effort. CV: Nondisplaced PMI.  Heart regular S1/S2, no S3/S4, no murmur.  No peripheral edema.   Abdomen: Soft, nontender, no hepatosplenomegaly, no distention.  Skin: Intact without lesions or rashes.  Neurologic: Alert and oriented x 3.  Psych: Normal affect. Extremities: No clubbing or cyanosis.  HEENT: Normal.    EKG    Atrial fibrillation 70s (personally reviewed)  Labs    CBC Recent Labs    04/27/19 0314 04/28/19 0354  WBC 12.2* 17.9*  NEUTROABS 8.5* 12.3*  HGB 7.4* 8.9*  HCT 23.5* 29.3*  MCV 91.4 92.4  PLT 285 [id number]   Basic Metabolic Panel Recent  Labs    04/26/19 0429 04/26/19 1706 04/27/19 0314 04/27/19 0315  NA 136 135  --  136  K 4.3 4.6  --  4.6  CL 100 99  --  101  CO2 24 23  --  23  GLUCOSE 97 93  --  95  BUN 53* 64*  --  69*  CREATININE 6.00* 6.88*  --  7.58*  CALCIUM 12.9* 12.7*  --  12.5*  MG 2.5*  --  2.7*  --   PHOS 6.4* 6.9*  --  6.9*   Liver Function Tests Recent Labs    04/26/19 0429 04/26/19 1706 04/27/19 0315  AST 22  --   --   ALT 31  --   --   ALKPHOS 79  --   --   BILITOT 0.9  --   --   PROT 5.0*  --   --   ALBUMIN 2.2* 2.2* 2.1*   No results for input(s): LIPASE, AMYLASE in the last 72 hours. Cardiac Enzymes No results for input(s): CKTOTAL, CKMB, CKMBINDEX, TROPONINI in the last 72 hours.  BNP: BNP (last 3 results) Recent Labs    08/25/18 1746 01/30/19 0819 02/05/19 0951  BNP 513.0* 1,138.2* 1,186.2*    ProBNP (last 3 results) No results for input(s): PROBNP in the last  8760 hours.   D-Dimer No results for input(s): DDIMER in the last 72 hours. Hemoglobin A1C No results for input(s): HGBA1C in the last 72 hours. Fasting Lipid Panel No results for input(s): CHOL, HDL, LDLCALC, TRIG, CHOLHDL, LDLDIRECT in the last 72 hours. Thyroid Function Tests No results for input(s): TSH, T4TOTAL, T3FREE, THYROIDAB in the last 72 hours.  Invalid input(s): FREET3  Other results:   Imaging    No results found.   Medications:     Scheduled Medications: . sodium chloride   Intravenous Once  . amiodarone  400 mg Oral BID  . apixaban  5 mg Oral BID  . aspirin EC  81 mg Oral Daily  . B-complex with vitamin C  1 tablet Oral Daily  . bisacodyl  10 mg Oral Daily   Or  . bisacodyl  10 mg Rectal Daily  . Chlorhexidine Gluconate Cloth  6 each Topical Daily  . Chlorhexidine Gluconate Cloth  6 each Topical Q0600  . [START ON 05/04/2019] darbepoetin (ARANESP) injection - DIALYSIS  60 mcg Intravenous Q Mon-HD  . febuxostat  40 mg Oral QODAY  . fluticasone  2 spray Each Nare Daily  .  influenza vaccine adjuvanted  0.5 mL Intramuscular Tomorrow-1000  . mouth rinse  15 mL Mouth Rinse BID  . midodrine  5 mg Oral TID WC  . pantoprazole  40 mg Oral Daily  . pneumococcal 23 valent vaccine  0.5 mL Intramuscular Tomorrow-1000  . rosuvastatin  5 mg Oral q1800  . sodium chloride flush  10-40 mL Intracatheter Q12H  . sodium chloride flush  3 mL Intravenous Q12H    Infusions: . sodium chloride 10 mL/hr at 04/21/19 2209  . sodium chloride Stopped (04/23/19 1146)  . lactated ringers    . lactated ringers    . lactated ringers Stopped (04/22/19 0900)    PRN Medications: Place/Maintain arterial line **AND** sodium chloride, sodium chloride, hydrocortisone, lactated ringers, menthol-cetylpyridinium, metoprolol tartrate, morphine injection, ondansetron (ZOFRAN) IV, oxyCODONE, sodium chloride flush, sodium chloride flush, traMADol, witch hazel-glycerin   Assessment/Plan   1. Shock: Suspect initial cardiogenic shock in setting of atypical atrial flutter with RVR and ischemia from severe left main stenosis. May have developed a bit of sepsis omponent on 9/25.  Echo 9/25 EF 30%.  He is now off dobutamine and norepinephrine.  BP stable.  - Titrate down midodrine, will go to 5 mg tid today. He was not on this prior to admission.  2. CAD: Initially suspected acute anterior MI with occluded mid LAD.  However, suspect this was CTO.  Dr. Angelena Form did angioplasty but unable to restore flow.  Patient had a 90% distal left main stenosis compromising flow in LCx and large diagonal. Suspect this led to ischemia/chest pain when the patient went into rapid atrial flutter prior to admission.  Patient taken urgently for CABG supported by Impella. S/P CABG x3 on 9/24 (unable to graft LAD).  - Statin and ASA  - On eliquis 5 mg twice a day for AF.  - Hold b-blocker for now with recent shock, still on midodrine.  3. Acute on chronic systolic CHF: Echo with EF in 30% range with peri-apical severe  hypokinesis.  Ischemic cardiomyopathy.  CVP 5. Suspect co-ox inaccurate as above, will resend.  He is clinically stable.  - Titrating down on midodrine as above.  - Volume management by HD.    4. Paroxysmal Atrial flutter/fib: He had atypical atrial flutter initially, post-op had atrial fibrillation.  On and off  atrial fibrillation, rate is controlled when he goes in AF.  This morning, in AF but NSR overnight.  - Continue  amiodarone 400 mg bid.    - On eliquis 5 mg twice a day.  5. ESRD: Recent start on HD.  Nephrology following. CVVH stopped 10/2.  - HD again tomorrow.   - New HD catheter tomorrow.  6. ID: No fever but WBCs higher today.  Completed course of Zosyn.  - Plan to change out for new HD catheter.  7. Anemia: 1 unit PRBCs with HD 10/5, appropriate rise in hgb.  8. Thrombocytopenia: Recovered. HIT negative.    Mobilize.   Loralie Champagne  04/28/2019 9:07 AM

## 2019-04-28 NOTE — Progress Notes (Signed)
    Pre-op for [facility] placement tomorrow NPO past MN  Roxy Horseman PA-C

## 2019-04-29 ENCOUNTER — Inpatient Hospital Stay (HOSPITAL_COMMUNITY): Payer: Medicare Other | Admitting: Anesthesiology

## 2019-04-29 ENCOUNTER — Inpatient Hospital Stay (HOSPITAL_COMMUNITY): Payer: Medicare Other

## 2019-04-29 ENCOUNTER — Encounter (HOSPITAL_COMMUNITY): Payer: Self-pay | Admitting: Anesthesiology

## 2019-04-29 ENCOUNTER — Other Ambulatory Visit: Payer: Self-pay

## 2019-04-29 ENCOUNTER — Encounter (HOSPITAL_COMMUNITY)
Admission: EM | Disposition: A | Discharge status: Rehabilitation Facility | Payer: Self-pay | Source: Home / Self Care | Attending: Thoracic Surgery (Cardiothoracic Vascular Surgery)

## 2019-04-29 DIAGNOSIS — N186 End stage renal disease: Secondary | ICD-10-CM

## 2019-04-29 DIAGNOSIS — Z992 Dependence on renal dialysis: Secondary | ICD-10-CM

## 2019-04-29 HISTORY — PX: INSERTION OF DIALYSIS CATHETER: SHX1324

## 2019-04-29 LAB — COOXEMETRY PANEL
Carboxyhemoglobin: 2.1 % — ABNORMAL HIGH (ref 0.5–1.5)
Methemoglobin: 0.8 % (ref 0.0–1.5)
O2 Saturation: 60.8 %
Total hemoglobin: 8 g/dL — ABNORMAL LOW (ref 12.0–16.0)

## 2019-04-29 LAB — RENAL FUNCTION PANEL
Albumin: 2.6 g/dL — ABNORMAL LOW (ref 3.5–5.0)
Anion gap: 13 (ref 5–15)
BUN: 64 mg/dL — ABNORMAL HIGH (ref 8–23)
CO2: 23 mmol/L (ref 22–32)
Calcium: 11.3 mg/dL — ABNORMAL HIGH (ref 8.9–10.3)
Chloride: 101 mmol/L (ref 98–111)
Creatinine, Ser: 8.31 mg/dL — ABNORMAL HIGH (ref 0.61–1.24)
GFR calc Af Amer: 7 mL/min — ABNORMAL LOW (ref >60)
GFR calc non Af Amer: 6 mL/min — ABNORMAL LOW (ref >60)
Glucose, Bld: 108 mg/dL — ABNORMAL HIGH (ref 70–99)
Phosphorus: 7 mg/dL — ABNORMAL HIGH (ref 2.5–4.6)
Potassium: 4.2 mmol/L (ref 3.5–5.1)
Sodium: 137 mmol/L (ref 135–145)

## 2019-04-29 LAB — CBC
HCT: 23.4 % — ABNORMAL LOW (ref 39.0–52.0)
Hemoglobin: 7.4 g/dL — ABNORMAL LOW (ref 13.0–17.0)
MCH: 29.2 pg (ref 26.0–34.0)
MCHC: 31.6 g/dL (ref 30.0–36.0)
MCV: 92.5 fL (ref 80.0–100.0)
Platelets: 300 10*3/uL (ref 150–400)
RBC: 2.53 MIL/uL — ABNORMAL LOW (ref 4.22–5.81)
RDW: 16.5 % — ABNORMAL HIGH (ref 11.5–15.5)
WBC: 12.6 10*3/uL — ABNORMAL HIGH (ref 4.0–10.5)
nRBC: 0 % (ref 0.0–0.2)

## 2019-04-29 SURGERY — INSERTION OF DIALYSIS CATHETER
Anesthesia: General | Site: Neck

## 2019-04-29 MED ORDER — SEVELAMER CARBONATE 800 MG PO TABS
800.0000 mg | ORAL_TABLET | Freq: Three times a day (TID) | ORAL | Status: DC
  Administered 2019-04-29 – 2019-04-30 (×4): 800 mg via ORAL
  Filled 2019-04-29 (×4): qty 1

## 2019-04-29 MED ORDER — ALLOPURINOL 100 MG PO TABS
100.0000 mg | ORAL_TABLET | Freq: Every day | ORAL | Status: DC
  Administered 2019-04-29 – 2019-04-30 (×2): 100 mg via ORAL
  Filled 2019-04-29 (×2): qty 1

## 2019-04-29 MED ORDER — HEPARIN SODIUM (PORCINE) 1000 UNIT/ML IJ SOLN
INTRAMUSCULAR | Status: AC
  Administered 2019-04-29: 3400 [IU] via INTRAVENOUS
  Filled 2019-04-29: qty 4

## 2019-04-29 MED ORDER — CINACALCET HCL 30 MG PO TABS
30.0000 mg | ORAL_TABLET | Freq: Every day | ORAL | Status: DC
  Administered 2019-04-29 – 2019-04-30 (×2): 30 mg via ORAL
  Filled 2019-04-29 (×2): qty 1

## 2019-04-29 MED ORDER — FENTANYL CITRATE (PF) 100 MCG/2ML IJ SOLN
INTRAMUSCULAR | Status: DC | PRN
  Administered 2019-04-29: 25 ug via INTRAVENOUS

## 2019-04-29 MED ORDER — SODIUM CHLORIDE 0.9 % IV SOLN
INTRAVENOUS | Status: DC | PRN
  Administered 2019-04-29: 13:00:00 25 ug/min via INTRAVENOUS

## 2019-04-29 MED ORDER — ALBUMIN HUMAN 5 % IV SOLN
INTRAVENOUS | Status: AC
  Administered 2019-04-29: 12.5 g via INTRAVENOUS
  Filled 2019-04-29: qty 250

## 2019-04-29 MED ORDER — HEPARIN SODIUM (PORCINE) 1000 UNIT/ML IJ SOLN
INTRAMUSCULAR | Status: AC
  Filled 2019-04-29: qty 1

## 2019-04-29 MED ORDER — HEPARIN SODIUM (PORCINE) 1000 UNIT/ML IJ SOLN
1000.0000 [IU] | INTRAMUSCULAR | Status: DC | PRN
  Administered 2019-04-29: 23:00:00 3400 [IU] via INTRAVENOUS

## 2019-04-29 MED ORDER — HYDROCORTISONE (PERIANAL) 2.5 % EX CREA
TOPICAL_CREAM | Freq: Three times a day (TID) | CUTANEOUS | Status: DC
  Filled 2019-04-29: qty 28.35

## 2019-04-29 MED ORDER — ALBUMIN HUMAN 5 % IV SOLN
INTRAVENOUS | Status: DC | PRN
  Administered 2019-04-29: 13:00:00 via INTRAVENOUS

## 2019-04-29 MED ORDER — SODIUM CHLORIDE 0.9 % IV SOLN
INTRAVENOUS | Status: AC
  Filled 2019-04-29: qty 1.2

## 2019-04-29 MED ORDER — SODIUM CHLORIDE 0.9 % IV SOLN
INTRAVENOUS | Status: DC | PRN
  Administered 2019-04-29: 13:00:00 500 mL

## 2019-04-29 MED ORDER — MIDODRINE HCL 5 MG PO TABS
5.0000 mg | ORAL_TABLET | ORAL | Status: DC
  Administered 2019-04-29 (×2): 5 mg via ORAL
  Filled 2019-04-29: qty 1

## 2019-04-29 MED ORDER — ALBUMIN HUMAN 5 % IV SOLN
12.5000 g | Freq: Once | INTRAVENOUS | Status: AC
  Administered 2019-04-29: 15:00:00 12.5 g via INTRAVENOUS

## 2019-04-29 MED ORDER — PSYLLIUM 95 % PO PACK
1.0000 | PACK | Freq: Two times a day (BID) | ORAL | Status: DC
  Administered 2019-04-29 – 2019-04-30 (×2): 1 via ORAL
  Filled 2019-04-29 (×4): qty 1

## 2019-04-29 MED ORDER — PHENYLEPHRINE 40 MCG/ML (10ML) SYRINGE FOR IV PUSH (FOR BLOOD PRESSURE SUPPORT)
PREFILLED_SYRINGE | INTRAVENOUS | Status: DC | PRN
  Administered 2019-04-29: 160 ug via INTRAVENOUS
  Administered 2019-04-29 (×2): 120 ug via INTRAVENOUS
  Administered 2019-04-29: 80 ug via INTRAVENOUS

## 2019-04-29 MED ORDER — CEFAZOLIN SODIUM-DEXTROSE 2-3 GM-%(50ML) IV SOLR
INTRAVENOUS | Status: DC | PRN
  Administered 2019-04-29: 2 g via INTRAVENOUS

## 2019-04-29 MED ORDER — ETOMIDATE 2 MG/ML IV SOLN
INTRAVENOUS | Status: DC | PRN
  Administered 2019-04-29: 12 mg via INTRAVENOUS

## 2019-04-29 MED ORDER — 0.9 % SODIUM CHLORIDE (POUR BTL) OPTIME
TOPICAL | Status: DC | PRN
  Administered 2019-04-29: 1000 mL

## 2019-04-29 MED ORDER — HEPARIN SODIUM (PORCINE) 1000 UNIT/ML IJ SOLN
INTRAMUSCULAR | Status: DC | PRN
  Administered 2019-04-29: 3400 [IU]

## 2019-04-29 MED ORDER — MIDAZOLAM HCL 2 MG/2ML IJ SOLN
INTRAMUSCULAR | Status: AC
  Filled 2019-04-29: qty 2

## 2019-04-29 MED ORDER — FENTANYL CITRATE (PF) 250 MCG/5ML IJ SOLN
INTRAMUSCULAR | Status: AC
  Filled 2019-04-29: qty 5

## 2019-04-29 MED ORDER — ROSUVASTATIN CALCIUM 5 MG PO TABS
10.0000 mg | ORAL_TABLET | Freq: Every day | ORAL | Status: DC
  Administered 2019-04-29 – 2019-04-30 (×2): 10 mg via ORAL
  Filled 2019-04-29 (×2): qty 2

## 2019-04-29 MED ORDER — LIDOCAINE-EPINEPHRINE 0.5 %-1:200000 IJ SOLN
INTRAMUSCULAR | Status: AC
  Filled 2019-04-29: qty 1

## 2019-04-29 MED ORDER — MIDODRINE HCL 5 MG PO TABS
ORAL_TABLET | ORAL | Status: AC
  Filled 2019-04-29: qty 1

## 2019-04-29 SURGICAL SUPPLY — 41 items
ADH SKN CLS APL DERMABOND .7 (GAUZE/BANDAGES/DRESSINGS) ×1
BAG DECANTER FOR FLEXI CONT (MISCELLANEOUS) ×3 IMPLANT
BIOPATCH RED 1 DISK 7.0 (GAUZE/BANDAGES/DRESSINGS) ×2 IMPLANT
BIOPATCH RED 1IN DISK 7.0MM (GAUZE/BANDAGES/DRESSINGS) ×1
CATH PALINDROME RT-P 15FX19CM (CATHETERS) IMPLANT
CATH PALINDROME RT-P 15FX23CM (CATHETERS) ×2 IMPLANT
CATH PALINDROME RT-P 15FX28CM (CATHETERS) IMPLANT
CATH PALINDROME RT-P 15FX55CM (CATHETERS) IMPLANT
COVER PROBE W GEL 5X96 (DRAPES) ×3 IMPLANT
COVER SURGICAL LIGHT HANDLE (MISCELLANEOUS) ×3 IMPLANT
COVER WAND RF STERILE (DRAPES) ×3 IMPLANT
DECANTER SPIKE VIAL GLASS SM (MISCELLANEOUS) ×3 IMPLANT
DERMABOND ADVANCED (GAUZE/BANDAGES/DRESSINGS) ×2
DERMABOND ADVANCED .7 DNX12 (GAUZE/BANDAGES/DRESSINGS) ×1 IMPLANT
DRAPE C-ARM 42X72 X-RAY (DRAPES) ×3 IMPLANT
DRAPE CHEST BREAST 15X10 FENES (DRAPES) ×3 IMPLANT
GAUZE 4X4 16PLY RFD (DISPOSABLE) ×3 IMPLANT
GLOVE SS BIOGEL STRL SZ 7.5 (GLOVE) ×1 IMPLANT
GLOVE SUPERSENSE BIOGEL SZ 7.5 (GLOVE) ×2
GOWN STRL REUS W/ TWL LRG LVL3 (GOWN DISPOSABLE) ×2 IMPLANT
GOWN STRL REUS W/TWL LRG LVL3 (GOWN DISPOSABLE) ×6
KIT BASIN OR (CUSTOM PROCEDURE TRAY) ×3 IMPLANT
KIT TURNOVER KIT B (KITS) ×3 IMPLANT
NDL 18GX1X1/2 (RX/OR ONLY) (NEEDLE) ×1 IMPLANT
NDL HYPO 25GX1X1/2 BEV (NEEDLE) ×1 IMPLANT
NEEDLE 18GX1X1/2 (RX/OR ONLY) (NEEDLE) ×3 IMPLANT
NEEDLE 22X1 1/2 (OR ONLY) (NEEDLE) IMPLANT
NEEDLE HYPO 25GX1X1/2 BEV (NEEDLE) ×3 IMPLANT
NS IRRIG 1000ML POUR BTL (IV SOLUTION) ×3 IMPLANT
PACK SURGICAL SETUP 50X90 (CUSTOM PROCEDURE TRAY) ×3 IMPLANT
PAD ARMBOARD 7.5X6 YLW CONV (MISCELLANEOUS) ×6 IMPLANT
SOAP 2 % CHG 4 OZ (WOUND CARE) ×3 IMPLANT
SUT ETHILON 3 0 PS 1 (SUTURE) ×3 IMPLANT
SUT VICRYL 4-0 PS2 18IN ABS (SUTURE) ×3 IMPLANT
SYR 10ML LL (SYRINGE) ×3 IMPLANT
SYR 20ML LL LF (SYRINGE) ×3 IMPLANT
SYR 5ML LL (SYRINGE) ×6 IMPLANT
SYR CONTROL 10ML LL (SYRINGE) ×3 IMPLANT
TOWEL GREEN STERILE (TOWEL DISPOSABLE) ×6 IMPLANT
TOWEL GREEN STERILE FF (TOWEL DISPOSABLE) ×3 IMPLANT
WATER STERILE IRR 1000ML POUR (IV SOLUTION) ×3 IMPLANT

## 2019-04-29 NOTE — H&P (View-Only) (Signed)
Pt for HD catheter by Dr Donnetta Hutching today.  [place] reviewed probable mid fistula narrowing  Will schedule for outpt fistulogram in 2 weeks  Ruta Hinds, MD [facility] [place]: [phone] Pager: [phone]

## 2019-04-29 NOTE — Progress Notes (Addendum)
[address]       [employer] [address]             [phone]      13 Days Post-Op Procedure(s) (LRB): CORONARY ARTERY BYPASS GRAFTING (CABG)X3  , WITH ENDOSCOPIC HARVESTING OF RIGHT GREATER SAPHENOUS VEIN (N/A) TRANSESOPHAGEAL ECHOCARDIOGRAM (TEE) (N/A) Subjective:C/O Some hemorrrhoid discomfort/bleeding Left ulnar distribution left arm numbness  Objective: Vital signs in last 24 hours: Temp:  [97.6 F (36.4 C)-98.6 F (37 C)] 97.9 F (36.6 C) (10/07 0758) Pulse Rate:  [81-92] 92 (10/07 0253) Cardiac Rhythm: Heart block (10/07 0700) Resp:  [11-30] 30 (10/07 0758) BP: (96-152)/(50-85) 96/57 (10/07 0758) SpO2:  [98 %] 98 % (10/07 0253) Weight:  SY:[id number] kg] 124 kg (10/07 0253)  Hemodynamic parameters for last 24 hours: CVP:  [5 mmHg-9 mmHg] 5 mmHg  Intake/Output from previous day: 10/06 0701 - 10/07 0700 In: 940 [P.O.:940] Out: 250 [Urine:250] Intake/Output this shift: No intake/output data recorded.  General appearance: alert, cooperative and no distress Heart: RRR with occas extrasystole, + systolic murmur Lungs: mildly dim in bases Abdomen: benign, obese Extremities: + edema Wound: incis healing well  Lab Results: Recent Labs    04/27/19 0314 04/28/19 0354  WBC 12.2* 17.9*  HGB 7.4* 8.9*  HCT 23.5* 29.3*  PLT 285 402*   BMET:  Recent Labs    04/26/19 1706 04/27/19 0315  NA 135 136  K 4.6 4.6  CL 99 101  CO2 23 23  GLUCOSE 93 95  BUN 64* 69*  CREATININE 6.88* 7.58*  CALCIUM 12.7* 12.5*    PT/INR: No results for input(s): LABPROT, INR in the last 72 hours. ABG    Component Value Date/Time   PHART 7.376 04/20/2019 0515   HCO3 27.4 04/20/2019 0515   TCO2 29 04/20/2019 0515   ACIDBASEDEF 4.0 (H) 04/18/2019 0559   O2SAT 60.8 04/29/2019 0320   CBG (last 3)  No results for input(s): GLUCAP in the last 72 hours.  Meds Scheduled Meds:  sodium chloride   Intravenous Once   amiodarone  400 mg Oral BID   apixaban  5 mg  Oral BID   aspirin EC  81 mg Oral Daily   B-complex with vitamin C  1 tablet Oral Daily   bisacodyl  10 mg Oral Daily   Or   bisacodyl  10 mg Rectal Daily   Chlorhexidine Gluconate Cloth  6 each Topical Daily   Chlorhexidine Gluconate Cloth  6 each Topical Q0600   [START ON 05/04/2019] darbepoetin (ARANESP) injection - DIALYSIS  60 mcg Intravenous Q Mon-HD   febuxostat  40 mg Oral QODAY   fluticasone  2 spray Each Nare Daily   influenza vaccine adjuvanted  0.5 mL Intramuscular Tomorrow-1000   mouth rinse  15 mL Mouth Rinse BID   midodrine  5 mg Oral TID WC   pantoprazole  40 mg Oral Daily   pneumococcal 23 valent vaccine  0.5 mL Intramuscular Tomorrow-1000   rosuvastatin  5 mg Oral q1800   sodium chloride flush  10-40 mL Intracatheter Q12H   sodium chloride flush  3 mL Intravenous Q12H   Continuous Infusions:  sodium chloride 10 mL/hr at 04/21/19 2209   sodium chloride Stopped (04/23/19 1146)   ferric gluconate (FERRLECIT/NULECIT) IV     lactated ringers     lactated ringers     lactated ringers Stopped (04/22/19 0900)   PRN Meds:.Place/Maintain arterial line **AND** sodium chloride, sodium chloride, hydrocortisone, lactated ringers, menthol-cetylpyridinium,  metoprolol tartrate, morphine injection, ondansetron (ZOFRAN) IV, oxyCODONE, sodium chloride flush, sodium chloride flush, traMADol, witch hazel-glycerin  Xrays Dg Chest Port 1 View  Result Date: 04/28/2019 CLINICAL DATA:  Status post coronary artery bypass graft. EXAM: PORTABLE CHEST 1 VIEW COMPARISON:  Radiograph of April 23, 2019. FINDINGS: Stable cardiomegaly. Sternotomy wires are noted. Stable position of left subclavian catheter. Right internal jugular catheter is noted. No pneumothorax or pleural effusion is noted. Left lung is clear. Mild right basilar atelectasis is noted. Bony thorax is unremarkable. IMPRESSION: Mild right basilar subsegmental atelectasis. Bilateral internal jugular catheters are  noted. Electronically Signed   By: Marijo Conception M.D.   On: 04/28/2019 10:07   Vas US Duplex Dialysis Access (avf, Avg)  Result Date: 04/29/2019 DIALYSIS ACCESS Reason for Exam: Unable to dialyze through AVF/AVG. Access Site: Right Upper Extremity. Access Type: Brachial-cephalic AVF. Comparison Study: 03/02/19 Performing Technologist: June Leap RDMS, RVT  Examination Guidelines: A complete evaluation includes B-mode imaging, spectral Doppler, color Doppler, and power Doppler as needed of all accessible portions of each vessel. Unilateral testing is considered an integral part of a complete examination. Limited examinations for reoccurring indications may be performed as noted.  Findings: +--------------------+----------+-----------------+--------+  AVF                  PSV (cm/s) Flow Vol (mL/min) Comments  +--------------------+----------+-----------------+--------+  Native artery inflow    300           2216                  +--------------------+----------+-----------------+--------+  +------------+----------+-------------+----------+--------+  OUTFLOW VEIN PSV (cm/s) Diameter (cm) Depth (cm) Describe  +------------+----------+-------------+----------+--------+  Shoulder        243         0.90         0.71              +------------+----------+-------------+----------+--------+  Prox UA                     0.82         0.92              +------------+----------+-------------+----------+--------+  Mid UA          210         0.77         0.27              +------------+----------+-------------+----------+--------+  Dist UA         608         0.77         0.52              +------------+----------+-------------+----------+--------+  AC Fossa        381         0.96         1.30              +------------+----------+-------------+----------+--------+   Summary: Patent Right brachiocephalic fistula. Appears unchanged when compared to previous study.  *See table(s) above for measurements and observations.   Diagnosing physician: Harold Barban MD Electronically signed by Harold Barban MD on 04/29/2019 at 7:02:39 AM.   --------------------------------------------------------------------------------   Final     Assessment/Plan: S/P Procedure(s) (LRB): CORONARY ARTERY BYPASS GRAFTING (CABG)X3  , WITH ENDOSCOPIC HARVESTING OF RIGHT GREATER SAPHENOUS VEIN (N/A) TRANSESOPHAGEAL ECHOCARDIOGRAM (TEE) (N/A)  1 conts to progress overall, having external hemorrhoidal bleeding, will ask general surgery to see 2 hemodynamics- variable BP-  Co-Ox is 60- AHF conts to assist with management 3 dialysis and renal management as per nephrology, Vasc to place [facility] today 4 no other new labs or CXR 's today, CBC is pending 5 may have brachial plexus stretch injury, cont therapies 6 poss CIR soon  LOS: 13 days    John Giovanni PA-C 04/29/2019 Pager [phone]  Agree with above No surgical intervention for hemorrhoids at this point dispo planning

## 2019-04-29 NOTE — Progress Notes (Signed)
Patient via bed to OR for catheter placement

## 2019-04-29 NOTE — Interval H&P Note (Signed)
History and Physical Interval Note:  04/29/2019 11:57 AM  Alex Williams  has presented today for surgery, with the diagnosis of END STAGE RENAL DISEASE FOR HEMODIALYSIS ACCESS.  The various methods of treatment have been discussed with the patient and family. After consideration of risks, benefits and other options for treatment, the patient has consented to  Procedure(s): INSERTION OF TUNNELED DIALYSIS CATHETER (N/A) as a surgical intervention.  The patient's history has been reviewed, patient examined, no change in status, stable for surgery.  I have reviewed the patient's chart and labs.  Questions were answered to the patient's satisfaction.     Curt Jews

## 2019-04-29 NOTE — Progress Notes (Signed)
Pt for HD catheter by Dr Donnetta Hutching today.  [place] reviewed probable mid fistula narrowing  Will schedule for outpt fistulogram in 2 weeks  Ruta Hinds, MD [facility] [place]: [phone] Pager: [phone]

## 2019-04-29 NOTE — Transfer of Care (Signed)
Immediate Anesthesia Transfer of Care Note  Patient: Alex Williams  Procedure(s) Performed: INSERTION OF TUNNELED DIALYSIS CATHETER, right internal jugular (N/A Neck)  Patient Location: PACU  Anesthesia Type:General  Level of Consciousness: awake, alert , oriented and sedated  Airway & Oxygen Therapy: Patient Spontanous Breathing and Patient connected to nasal cannula oxygen  Post-op Assessment: Report given to RN, Post -op Vital signs reviewed and stable and Patient moving all extremities  Post vital signs: Reviewed and stable  Last Vitals:  Vitals Value Taken Time  BP 98/58 04/29/19 1337  Temp 36.1 C 04/29/19 1337  Pulse 79 04/29/19 1341  Resp 18 04/29/19 1341  SpO2 100 % 04/29/19 1341  Vitals shown include unvalidated device data.  Last Pain:  Vitals:   04/29/19 0758  TempSrc: Oral  PainSc:       Patients Stated Pain Goal: 3 ([id number])  Complications: No apparent anesthesia complications

## 2019-04-29 NOTE — Progress Notes (Signed)
[facility] Progress Note   Assessment/ Plan:   Dialysis: [place] MWF - new start, had OP HD on 9/21 and 9/23 prior to admission 3h 132kg 300/600 RUE AVF 2/2 bath Hep none venofer 100 mg ordered each treatment x 4,Hb 8.5 on 9/21 noESA order yet PTH 1366 on 04/15/19   Assessment/ Plan #Cardiogenic shock/STEMI: Status post emergent CABG on 9/24. SP Impella, removed 04/19/19. As per heart failure team and CT surgery.  - midodrine at 15 tid--> 5 TID now, trying to see if he could tolerate off so he could start BB  # ESRD: new start had 2 OP sessions prior to admission. Required CRRT due to shock from 9/25 - 10/2.  AVF used twice at OP HD w/ sig difficulty per the staff.   - 8 kg under dry wt, 124kg - HD now on MWF schedule, CRRT off since 10/2 - VVS to place [facility] today (10/7), outpt fgram in 2 weeks, appreciate assistance  # Anemia: Due to ABLA and CKD: Transfuse bloodprn. Hb down < 8 will start darbe with 60 ug sq today then 60 ug IV q Mon.  Check fe tibc--> Tsat 14, start ferrlicet with HD today [id number] for full course.   # Secondary hyperparathyroidism: hypercalcemia prob immobilization. Vit D has been on hold here.  PTH 1366 on 9/23 and OP Ca++ 11.3 9/23  - cont to follow - low Ca+ bath when on regular HD - start sensipar 30 mg QHS 10/7--> given high Ca and high PTH, may need OP parathyroid scan - start renvela 800 TID [facility] 10/7  # CAD sp CABG x 3 on 9/24, AS 81 mg daily  # Afib - on PO amio and eliquis  # ID - sp empiric IV abx, dc'd 10/2 - WBC up to 17.6 10/6, CTM, culture if febrile   Subjective:    Working with PT.  [facility] for today.  For HD afterwards.  Appreciate VVS assistance.     Objective:   BP (!) 96/57 (BP Location: Left Arm)   Pulse 92   Temp 97.9 F (36.6 C) (Oral)   Resp (!) 30   Ht 6' (1.829 m)   Wt 124 kg   SpO2 98%   BMI 37.08 kg/m   Physical Exam: Gen: NAD, sitting in bed CVS: irregular, well-healed sternal  scar Resp: clear bilaterally Abd: soft, nontender, NABS Ext: trace LE edema ACCESS: RUE AVF weak thrill/ bruit, R nontunneled HD cath  Labs: BMET Recent Labs  Lab 04/24/19 0423 04/24/19 1531 04/25/19 0417 04/25/19 1745 04/26/19 0429 04/26/19 1706 04/27/19 0315  NA 137 137 138 137 136 135 136  K 3.8 4.0 4.0 4.5 4.3 4.6 4.6  CL 101 102 101 102 100 99 101  CO2 26 26 26 24 24 23 23   GLUCOSE 98 98 98 132* 97 93 95  BUN 20 22 33* 42* 53* 64* 69*  CREATININE 2.47* 2.73* 3.93* 5.02* 6.00* 6.88* 7.58*  CALCIUM 11.9* 12.0* 12.8* 13.1* 12.9* 12.7* 12.5*  PHOS 3.2 3.6 4.7* 5.7* 6.4* 6.9* 6.9*   CBC Recent Labs  Lab 04/25/19 0417 04/26/19 0429 04/27/19 0314 04/28/19 0354  WBC 12.8* 13.0* 12.2* 17.9*  NEUTROABS 8.0* 8.2* 8.5* 12.3*  HGB 7.8* 7.5* 7.4* 8.9*  HCT 25.2* 24.4* 23.5* 29.3*  MCV 92.3 92.4 91.4 92.4  PLT 235 293 285 402*    @IMGRELPRIORS @ Medications:    . sodium chloride   Intravenous Once  . allopurinol  100 mg Oral Daily  .  amiodarone  400 mg Oral BID  . apixaban  5 mg Oral BID  . aspirin EC  81 mg Oral Daily  . B-complex with vitamin C  1 tablet Oral Daily  . bisacodyl  10 mg Oral Daily   Or  . bisacodyl  10 mg Rectal Daily  . Chlorhexidine Gluconate Cloth  6 each Topical Daily  . Chlorhexidine Gluconate Cloth  6 each Topical Q0600  . [START ON 05/04/2019] darbepoetin (ARANESP) injection - DIALYSIS  60 mcg Intravenous Q Mon-HD  . fluticasone  2 spray Each Nare Daily  . influenza vaccine adjuvanted  0.5 mL Intramuscular Tomorrow-1000  . mouth rinse  15 mL Mouth Rinse BID  . midodrine  5 mg Oral Q M,W,F  . pantoprazole  40 mg Oral Daily  . pneumococcal 23 valent vaccine  0.5 mL Intramuscular Tomorrow-1000  . rosuvastatin  10 mg Oral q1800  . sodium chloride flush  10-40 mL Intracatheter Q12H  . sodium chloride flush  3 mL Intravenous Q12H     Madelon Lips, MD 04/29/2019, 10:11 AM

## 2019-04-29 NOTE — Anesthesia Postprocedure Evaluation (Signed)
Anesthesia Post Note  Patient: Alex Williams  Procedure(s) Performed: INSERTION OF TUNNELED DIALYSIS CATHETER, right internal jugular (N/A Neck)     Patient location during evaluation: PACU Anesthesia Type: General Level of consciousness: awake and alert Pain management: pain level controlled Vital Signs Assessment: post-procedure vital signs reviewed and stable Respiratory status: spontaneous breathing, nonlabored ventilation, respiratory function stable and patient connected to nasal cannula oxygen Cardiovascular status: stable and blood pressure returned to baseline Postop Assessment: no apparent nausea or vomiting Anesthetic complications: no    Last Vitals:  Vitals:   04/29/19 1515 04/29/19 1519  BP: (!) 100/57 (!) 100/57  Pulse: 75 76  Resp: 13 14  Temp: 36.5 C   SpO2: 97% 100%    Last Pain:  Vitals:   04/29/19 1515  TempSrc:   PainSc: 0-No pain                 Blayklee Mable COKER

## 2019-04-29 NOTE — Anesthesia Procedure Notes (Signed)
Procedure Name: LMA Insertion Date/Time: 04/29/2019 12:25 PM Performed by: Scheryl Darter, CRNA Pre-anesthesia Checklist: Patient identified, Emergency Drugs available, Suction available and Patient being monitored Patient Re-evaluated:Patient Re-evaluated prior to induction Oxygen Delivery Method: Circle System Utilized Preoxygenation: Pre-oxygenation with 100% oxygen Induction Type: IV induction Ventilation: Mask ventilation without difficulty LMA: LMA inserted LMA Size: 5.0 Number of attempts: 1 Airway Equipment and Method: Bite block Placement Confirmation: positive ETCO2 Tube secured with: Tape Dental Injury: Teeth and Oropharynx as per pre-operative assessment

## 2019-04-29 NOTE — Anesthesia Preprocedure Evaluation (Signed)
Anesthesia Evaluation  Patient identified by MRN, date of birth, ID band Patient awake    Reviewed: Allergy & Precautions, NPO status , Patient's Chart, lab work & pertinent test results  Airway Mallampati: II       Dental   Pulmonary former smoker,    breath sounds clear to auscultation       Cardiovascular hypertension,  Rhythm:Regular Rate:Normal     Neuro/Psych    GI/Hepatic   Endo/Other    Renal/GU      Musculoskeletal   Abdominal   Peds  Hematology   Anesthesia Other Findings   Reproductive/Obstetrics                             Anesthesia Physical Anesthesia Plan  ASA: III  Anesthesia Plan: General   Post-op Pain Management:    Induction: Intravenous  PONV Risk Score and Plan: Ondansetron  Airway Management Planned: LMA  Additional Equipment:   Intra-op Plan:   Post-operative Plan:   Informed Consent: I have reviewed the patients History and Physical, chart, labs and discussed the procedure including the risks, benefits and alternatives for the proposed anesthesia with the patient or authorized representative who has indicated his/her understanding and acceptance.       Plan Discussed with: CRNA and Anesthesiologist  Anesthesia Plan Comments:         Anesthesia Quick Evaluation

## 2019-04-29 NOTE — Progress Notes (Signed)
Patient ID: Alex Williams, male   DOB: 1951/07/18, 68 y.o.   MRN: [id number]     Advanced Heart Failure Rounding Note  PCP-Cardiologist: No primary care provider on file.   Subjective:    9/24 S/P CABG x3. Impella in place.  9/27 Impella removed.  10/2 CVVH stopped.   No complaints this morning, no labs yet.  CVP 5-6.  Co-ox 61%.   He is in NSR this morning.     Objective:   Weight Range: 124 kg Body mass index is 37.08 kg/m.   Vital Signs:   Temp:  [97.6 F (36.4 C)-98.6 F (37 C)] 97.9 F (36.6 C) (10/07 0758) Pulse Rate:  [81-92] 92 (10/07 0253) Resp:  [11-30] 30 (10/07 0758) BP: (96-152)/(50-85) 96/57 (10/07 0758) SpO2:  [98 %] 98 % (10/07 0253) Weight:  [124 kg] 124 kg (10/07 0253) Last BM Date: 04/28/19  Weight change: Filed Weights   04/27/19 1514 04/28/19 0320 04/29/19 0253  Weight: 120.9 kg 124.3 kg 124 kg    Intake/Output:   Intake/Output Summary (Last 24 hours) at 04/29/2019 0820 Last data filed at 04/29/2019 0300 Gross per 24 hour  Intake 940 ml  Output 250 ml  Net 690 ml      Physical Exam  CVP 5-6  General: NAD Neck: No JVD, no thyromegaly or thyroid nodule.  Lungs: Clear to auscultation bilaterally with normal respiratory effort. CV: Nondisplaced PMI.  Heart regular S1/S2, no S3/S4, no murmur.  No peripheral edema.    Abdomen: Soft, nontender, no hepatosplenomegaly, no distention.  Skin: Intact without lesions or rashes.  Neurologic: Alert and oriented x 3.  Psych: Normal affect. Extremities: No clubbing or cyanosis.  HEENT: Normal.   EKG    NSR 70s (personally reviewed)  Labs    CBC Recent Labs    04/27/19 0314 04/28/19 0354  WBC 12.2* 17.9*  NEUTROABS 8.5* 12.3*  HGB 7.4* 8.9*  HCT 23.5* 29.3*  MCV 91.4 92.4  PLT 285 [id number]*   Basic Metabolic Panel Recent Labs    04/26/19 1706 04/27/19 0314 04/27/19 0315  NA 135  --  136  K 4.6  --  4.6  CL 99  --  101  CO2 23  --  23  GLUCOSE 93  --  95  BUN 64*  --  69*   CREATININE 6.88*  --  7.58*  CALCIUM 12.7*  --  12.5*  MG  --  2.7*  --   PHOS 6.9*  --  6.9*   Liver Function Tests Recent Labs    04/26/19 1706 04/27/19 0315  ALBUMIN 2.2* 2.1*   No results for input(s): LIPASE, AMYLASE in the last 72 hours. Cardiac Enzymes No results for input(s): CKTOTAL, CKMB, CKMBINDEX, TROPONINI in the last 72 hours.  BNP: BNP (last 3 results) Recent Labs    08/25/18 1746 01/30/19 0819 02/05/19 0951  BNP 513.0* 1,138.2* 1,186.2*    ProBNP (last 3 results) No results for input(s): PROBNP in the last 8760 hours.   D-Dimer No results for input(s): DDIMER in the last 72 hours. Hemoglobin A1C No results for input(s): HGBA1C in the last 72 hours. Fasting Lipid Panel No results for input(s): CHOL, HDL, LDLCALC, TRIG, CHOLHDL, LDLDIRECT in the last 72 hours. Thyroid Function Tests No results for input(s): TSH, T4TOTAL, T3FREE, THYROIDAB in the last 72 hours.  Invalid input(s): FREET3  Other results:   Imaging    Dg Chest Port 1 View  Result Date: 04/28/2019 CLINICAL DATA:  Status  post coronary artery bypass graft. EXAM: PORTABLE CHEST 1 VIEW COMPARISON:  Radiograph of April 23, 2019. FINDINGS: Stable cardiomegaly. Sternotomy wires are noted. Stable position of left subclavian catheter. Right internal jugular catheter is noted. No pneumothorax or pleural effusion is noted. Left lung is clear. Mild right basilar atelectasis is noted. Bony thorax is unremarkable. IMPRESSION: Mild right basilar subsegmental atelectasis. Bilateral internal jugular catheters are noted. Electronically Signed   By: Marijo Conception M.D.   On: 04/28/2019 10:07   Vas US Duplex Dialysis Access (avf, Avg)  Result Date: 04/29/2019 DIALYSIS ACCESS Reason for Exam: Unable to dialyze through AVF/AVG. Access Site: Right Upper Extremity. Access Type: Brachial-cephalic AVF. Comparison Study: 03/02/19 Performing Technologist: June Leap RDMS, RVT  Examination Guidelines: A complete  evaluation includes B-mode imaging, spectral Doppler, color Doppler, and power Doppler as needed of all accessible portions of each vessel. Unilateral testing is considered an integral part of a complete examination. Limited examinations for reoccurring indications may be performed as noted.  Findings: +--------------------+----------+-----------------+--------+ AVF                 PSV (cm/s)Flow Vol (mL/min)Comments +--------------------+----------+-----------------+--------+ Native artery inflow   300          2216                +--------------------+----------+-----------------+--------+  +------------+----------+-------------+----------+--------+ OUTFLOW VEINPSV (cm/s)Diameter (cm)Depth (cm)Describe +------------+----------+-------------+----------+--------+ Shoulder       243        0.90        0.71            +------------+----------+-------------+----------+--------+ Prox UA                   0.82        0.92            +------------+----------+-------------+----------+--------+ Mid UA         210        0.77        0.27            +------------+----------+-------------+----------+--------+ Dist UA        608        0.77        0.52            +------------+----------+-------------+----------+--------+ AC Fossa       381        0.96        1.30            +------------+----------+-------------+----------+--------+   Summary: Patent Right brachiocephalic fistula. Appears unchanged when compared to previous study.  *See table(s) above for measurements and observations.  Diagnosing physician: Harold Barban MD Electronically signed by Harold Barban MD on 04/29/2019 at 7:02:39 AM.   --------------------------------------------------------------------------------   Final      Medications:     Scheduled Medications: . sodium chloride   Intravenous Once  . amiodarone  400 mg Oral BID  . apixaban  5 mg Oral BID  . aspirin EC  81 mg Oral Daily  . B-complex with  vitamin C  1 tablet Oral Daily  . bisacodyl  10 mg Oral Daily   Or  . bisacodyl  10 mg Rectal Daily  . Chlorhexidine Gluconate Cloth  6 each Topical Daily  . Chlorhexidine Gluconate Cloth  6 each Topical Q0600  . [START ON 05/04/2019] darbepoetin (ARANESP) injection - DIALYSIS  60 mcg Intravenous Q Mon-HD  . febuxostat  40 mg Oral QODAY  . fluticasone  2 spray Each Nare  Daily  . hydrocortisone   Rectal TID  . influenza vaccine adjuvanted  0.5 mL Intramuscular Tomorrow-1000  . mouth rinse  15 mL Mouth Rinse BID  . pantoprazole  40 mg Oral Daily  . pneumococcal 23 valent vaccine  0.5 mL Intramuscular Tomorrow-1000  . rosuvastatin  5 mg Oral q1800  . sodium chloride flush  10-40 mL Intracatheter Q12H  . sodium chloride flush  3 mL Intravenous Q12H    Infusions: . sodium chloride 10 mL/hr at 04/21/19 2209  . sodium chloride Stopped (04/23/19 1146)  . ferric gluconate (FERRLECIT/NULECIT) IV    . lactated ringers    . lactated ringers    . lactated ringers Stopped (04/22/19 0900)    PRN Medications: Place/Maintain arterial line **AND** sodium chloride, sodium chloride, hydrocortisone, lactated ringers, menthol-cetylpyridinium, metoprolol tartrate, morphine injection, ondansetron (ZOFRAN) IV, oxyCODONE, sodium chloride flush, sodium chloride flush, traMADol, witch hazel-glycerin   Assessment/Plan   1. Shock: Suspect initial cardiogenic shock in setting of atypical atrial flutter with RVR and ischemia from severe left main stenosis. May have developed a bit of sepsis omponent on 9/25.  Echo 9/25 EF 30%.  He is now off dobutamine and norepinephrine.  BP stable.  - Titrate down midodrine, will change to 5 mg MWF pre-HD.   2. CAD: Initially suspected acute anterior MI with occluded mid LAD.  However, suspect this was CTO.  Dr. Angelena Form did angioplasty but unable to restore flow.  Patient had a 90% distal left main stenosis compromising flow in LCx and large diagonal. Suspect this led to  ischemia/chest pain when the patient went into rapid atrial flutter prior to admission.  Patient taken urgently for CABG supported by Impella. S/P CABG x3 on 9/24 (unable to graft LAD).  - ASA 81.  - Increase Crestor to 10 mg daily (maximum for ESRD).  - On eliquis 5 mg twice a day for AF.  - If he tolerates being off midodrine, will try to start low dose Coreg 3.125 mg bid possibly tomorrow.   3. Acute on chronic systolic CHF: Echo with EF in 30% range with peri-apical severe hypokinesis.  Ischemic cardiomyopathy.  CVP 5. Suspect co-ox inaccurate as above, will resend.  He is clinically stable.  - Titrating down on midodrine as above.  - Volume management by HD.    4. Paroxysmal Atrial flutter/fib: He had atypical atrial flutter initially, post-op had atrial fibrillation.  On and off atrial fibrillation, rate is controlled when he goes in AF.  He is in NSR this morning.  - Continue  amiodarone 400 mg bid.    - On eliquis 5 mg twice a day.  5. ESRD: Recent start on HD.  Nephrology following. CVVH stopped 10/2.  - HD today after he gets new HD catheter.   6. ID: No fever, pending CBC.  Completed course of Zosyn.  - Plan to change out for new HD catheter.  7. Anemia: 1 unit PRBCs with HD 10/5, appropriate rise in hgb. Pending CBC today.  8. Thrombocytopenia: Recovered. HIT negative.    Mobilize.   Loralie Champagne  04/29/2019 8:20 AM

## 2019-04-29 NOTE — Op Note (Signed)
    OPERATIVE REPORT  DATE OF SURGERY: 04/29/2019  PATIENT: Alex Williams, 68 y.o. male MRN: [id number]  DOB: 1951/02/10  PRE-OPERATIVE DIAGNOSIS: End-stage renal disease  POST-OPERATIVE DIAGNOSIS:  Same  PROCEDURE: Insertion of tunneled hemodialysis catheter, right internal jugular vein  SURGEON:  Curt Jews, M.D.  PHYSICIAN ASSISTANT: Nurse  ANESTHESIA: LMA  EBL: per anesthesia record  No intake/output data recorded.  BLOOD ADMINISTERED: none  DRAINS: none  SPECIMEN: none  COUNTS CORRECT:  YES  PATIENT DISPOSITION:  PACU - hemodynamically stable  PROCEDURE DETAILS: Patient was taken the operating placed position with area of the right and left neck and chest prepped draped you sterile fashion.  The patient had an existing temporary catheter in the right internal jugular vein this was prepped into the field.  A guidewire was passed down through the central portion of the catheter and the catheter was removed in its entirety.  Fluoroscopy was used to confirm the appropriate placement into the distal right atrium.  Dilator and peel-away sheath was passed over the guidewire and the dilator and guidewire were removed.  A 23 cm catheter was positioned with the tip in the distal right atrium.  The peel-away sheath was removed.  The catheter was brought through a subcutaneous tunnel through a separate stab incision.  2 lm ports were attached and both lumens flushed and aspirated easily and were locked with 1000/cc heparin.  The catheter was secured to the skin with 3-0 nylon stitch and the inside was closed for septic 0 Vicryl stitch.  Sterile dressing was applied.  The patient was transferred to the recovery room where chest x-ray is pending   Rosetta Posner, M.D., [facility] 04/29/2019 1:26 PM

## 2019-04-29 NOTE — Consult Note (Signed)
[facility] Consult/Admission Note  Alex Williams January 09, 1951  [id number].    Requesting Provider: Jadene Pierini, PA-C Chief Complaint/Reason for Consult: Bleeding hemorrhoids  HPI:   Patient is a 68 year old male with a history of obesity, OSA, HTN, gout, GERD, CKD, who is s/p CABG on 09/24.  Patient is on Eliquis and aspirin. patient is complaining of bleeding per rectum with history of hemorrhoids.  We were asked to see.  Patient states history of hemorrhoids prior to this hospitalization.  He states minimal bleeding with bowel movements prior to hospitalization.  Bleeding has increased over the last week.  Nurse at bedside states blood on bed pad.  Hemorrhoids are mildly tender.  Patient states bowel movements have been loose.  No other complaints.  ROS:  Review of Systems  Constitutional: Negative for chills, diaphoresis and fever.  HENT: Negative for sore throat.   Respiratory: Negative for cough and shortness of breath.   Cardiovascular: Negative for chest pain.  Gastrointestinal: Negative for abdominal pain, blood in stool, constipation, diarrhea, nausea and vomiting.  Genitourinary: Negative for dysuria.       + for bleeding hemorrhoids  Skin: Negative for rash.  Neurological: Negative for dizziness and loss of consciousness.  All other systems reviewed and are negative.    Family History  Problem Relation Age of Onset   Hypertension Other    Diabetes Sister    Hyperlipidemia Sister    Sleep apnea Sister     Past Medical History:  Diagnosis Date   Cervical disc disease    Chronic kidney disease    COLONIC POLYPS, HX OF 06/27/2007   EXOGENOUS OBESITY 01/30/2010   GERD (gastroesophageal reflux disease)    PMH   GOUT 01/30/2010   HYPERCHOLESTEROLEMIA 06/30/2007   HYPERTENSION 06/27/2007   LOW BACK PAIN 06/27/2007   NEPHROLITHIASIS, HX OF 06/27/2007   OSTEOARTHRITIS 06/27/2007   SLEEP APNEA, OBSTRUCTIVE, MODERATE 01/27/2009   Wears dentures     upper    Past Surgical History:  Procedure Laterality Date   AV FISTULA PLACEMENT Right 01/19/2019   Procedure: RIGHT BRACHIOCEPHALIC ARTERIOVENOUS (AV) FISTULA CREATION;  Surgeon: Angelia Mould, MD;  Location: [facility] OR;  Service: Vascular;  Laterality: Right;   CARDIAC CATHETERIZATION     CARPAL TUNNEL RELEASE     left hand   COLONOSCOPY     with polypectomy   CORONARY ARTERY BYPASS GRAFT N/A 04/16/2019   Procedure: CORONARY ARTERY BYPASS GRAFTING (CABG)X3  , WITH ENDOSCOPIC HARVESTING OF RIGHT [place];  Surgeon: Lajuana Matte, MD;  Location: [place];  Service: Open Heart Surgery;  Laterality: N/A;   CORONARY/GRAFT ACUTE MI REVASCULARIZATION N/A 04/16/2019   Procedure: CORONARY/GRAFT ACUTE MI REVASCULARIZATION;  Surgeon: Burnell Blanks, MD;  Location: [place];  Service: Cardiovascular;  Laterality: N/A;   JOINT REPLACEMENT Left 2005   knee   LUMBAR LAMINECTOMY     MULTIPLE TOOTH EXTRACTIONS     RADIOFREQUENCY ABLATION KIDNEY     RIGHT/LEFT HEART CATH AND CORONARY ANGIOGRAPHY N/A 04/16/2019   Procedure: RIGHT/LEFT HEART CATH AND CORONARY ANGIOGRAPHY;  Surgeon: Burnell Blanks, MD;  Location: [place] [facility];  Service: Cardiovascular;  Laterality: N/A;   TEE WITHOUT CARDIOVERSION N/A 04/16/2019   Procedure: TRANSESOPHAGEAL ECHOCARDIOGRAM (TEE);  Surgeon: Lajuana Matte, MD;  Location: [place];  Service: Open Heart Surgery;  Laterality: N/A;   TOTAL KNEE ARTHROPLASTY     left   VENTRICULAR ASSIST DEVICE INSERTION N/A 04/16/2019   Procedure:  VENTRICULAR ASSIST DEVICE INSERTION;  Surgeon: Burnell Blanks, MD;  Location: [place] [facility];  Service: Cardiovascular;  Laterality: N/A;    Social History:  reports that he quit smoking about 38 years ago. He has never used smokeless tobacco. He reports current alcohol use of about 1.0 standard drinks of alcohol per week. He reports that he does not use  drugs.  Allergies:  Allergies  Allergen Reactions   Codeine Phosphate Other (See Comments)    Hyperactive     Medications Prior to Admission  Medication Sig Dispense Refill   acetaminophen (TYLENOL) 500 MG tablet Take 1,000 mg by mouth 2 (two) times a day.      amLODipine (NORVASC) 10 MG tablet Take 1 tablet (10 mg total) by mouth daily. (Patient taking differently: Take 5 mg by mouth at bedtime. ) 90 tablet 3   febuxostat (ULORIC) 40 MG tablet Take 40 mg by mouth every Monday.     ferric citrate (AURYXIA) 1 GM 210 MG(Fe) tablet Take 210 mg by mouth 3 (three) times daily with meals.     fluticasone (FLONASE) 50 MCG/ACT nasal spray Place 2 sprays into both nostrils daily. (Patient taking differently: Place 2 sprays into both nostrils daily as needed for allergies or rhinitis. ) 16 g 6   furosemide (LASIX) 80 MG tablet Take 2 tablets (160 mg total) by mouth 2 (two) times daily. (Patient taking differently: Take 80 mg by mouth See admin instructions. Take one tablet (80 mg) by mouth on Saturday or Sunday if needed for leg swelling (not on weekdays)) 120 tablet 0   hydrocortisone cream 1 % Apply 1 application topically at bedtime.     hydrOXYzine (ATARAX/VISTARIL) 25 MG tablet TAKE 1 TABLET (25 MG TOTAL) BY MOUTH 3 (THREE) TIMES DAILY AS NEEDED FOR ITCHING (INSOMNIA). (Patient taking differently: Take 25 mg by mouth at bedtime. For itching/insomnia) 30 tablet 0   Multiple Vitamin (MULTIVITAMIN WITH MINERALS) TABS tablet Take 1 tablet by mouth daily. One a Day Men's     rosuvastatin (CRESTOR) 5 MG tablet Take 1 tablet (5 mg total) by mouth daily. (Patient taking differently: Take 5 mg by mouth every 3 (three) days. ) 90 tablet 2   terazosin (HYTRIN) 1 MG capsule Take 2 capsules (2 mg total) by mouth at bedtime. 180 capsule 1   traZODone (DESYREL) 50 MG tablet TAKE 1/2 TO 1 TABLET AT BEDTIME AS NEEDED FOR SLEEP (Patient taking differently: Take 50 mg by mouth at bedtime. ) 90 tablet 0    sodium bicarbonate 650 MG tablet Take 1 tablet (650 mg total) by mouth 2 (two) times daily. (Patient not taking: Reported on 04/17/2019) 180 tablet 4    Blood pressure (!) 96/57, pulse 92, temperature 97.9 F (36.6 C), temperature source Oral, resp. rate (!) 30, height 6' (1.829 m), weight 124 kg, SpO2 98 %.  Physical Exam Vitals signs and nursing note reviewed. Exam conducted with a chaperone present.  Constitutional:      General: He is not in acute distress.    Appearance: Normal appearance. He is not toxic-appearing or diaphoretic.  HENT:     Head: Normocephalic and atraumatic.     Nose: Nose normal.     Mouth/Throat:     Lips: Pink.     Mouth: Mucous membranes are moist.     Pharynx: Oropharynx is clear.  Eyes:     General: No scleral icterus.       Right eye: No discharge.  Left eye: No discharge.     Conjunctiva/sclera: Conjunctivae normal.     Pupils: Pupils are equal, round, and reactive to light.  Neck:     Musculoskeletal: Normal range of motion and neck supple.  Cardiovascular:     Rate and Rhythm: Normal rate and regular rhythm.     Heart sounds: Murmur present. Systolic murmur present.  Pulmonary:     Effort: Pulmonary effort is normal. No respiratory distress.     Breath sounds: Normal breath sounds. No wheezing, rhonchi or rales.  Chest:     Comments: CABG incision is well appearing Abdominal:     Palpations: Abdomen is not rigid.  Genitourinary:    Comments: External hemorrhoids noted, largest one on left side, no active bleeding, TTP see photo below Musculoskeletal: Normal range of motion.        General: No tenderness or deformity.  Skin:    General: Skin is warm and dry.     Findings: No rash.  Neurological:     Mental Status: He is alert and oriented to person, place, and time.  Psychiatric:        Mood and Affect: Mood normal.        Behavior: Behavior normal.         Results for orders placed or performed during the hospital  encounter of 04/16/19 (from the past 48 hour(s))  Hepatitis B surface antigen     Status: None   Collection Time: 04/27/19 12:32 PM  Result Value Ref Range   Hepatitis B Surface Ag NON REACTIVE NON REACTIVE    Comment: Performed at [facility], [address] [place], [place] [address]  CBC with Differential/Platelet     Status: Abnormal   Collection Time: 04/28/19  3:54 AM  Result Value Ref Range   WBC 17.9 (H) 4.0 - 10.5 K/uL   RBC 3.17 (L) 4.22 - 5.81 MIL/uL   Hemoglobin 8.9 (L) 13.0 - 17.0 g/dL   HCT 29.3 (L) 39.0 - 52.0 %   MCV 92.4 80.0 - 100.0 fL   MCH 28.1 26.0 - 34.0 pg   MCHC 30.4 30.0 - 36.0 g/dL   RDW 16.6 (H) 11.5 - 15.5 %   Platelets 402 (H) 150 - 400 K/uL   nRBC 0.0 0.0 - 0.2 %   Neutrophils Relative % 69 %   Neutro Abs 12.3 (H) 1.7 - 7.7 K/uL   Lymphocytes Relative 19 %   Lymphs Abs 3.5 0.7 - 4.0 K/uL   Monocytes Relative 10 %   Monocytes Absolute 1.7 (H) 0.1 - 1.0 K/uL   Eosinophils Relative 1 %   Eosinophils Absolute 0.2 0.0 - 0.5 K/uL   Basophils Relative 0 %   Basophils Absolute 0.1 0.0 - 0.1 K/uL   Immature Granulocytes 1 %   Abs Immature Granulocytes 0.18 (H) 0.00 - 0.07 K/uL    Comment: Performed at [facility] [address] [place], [place] [address]  .Cooxemetry Panel (carboxy, met, total hgb, O2 sat)     Status: Abnormal   Collection Time: 04/28/19  4:00 AM  Result Value Ref Range   Total hemoglobin 10.0 (L) 12.0 - 16.0 g/dL   O2 Saturation 32.1 %   Carboxyhemoglobin 1.6 (H) 0.5 - 1.5 %   Methemoglobin 0.9 0.0 - 1.5 %  .Cooxemetry Panel (carboxy, met, total hgb, O2 sat)     Status: Abnormal   Collection Time: 04/28/19 10:20 AM  Result Value Ref Range   Total hemoglobin  11.4 (L) 12.0 - 16.0 g/dL   O2 Saturation 66.7 %   Carboxyhemoglobin 2.2 (H) 0.5 - 1.5 %   Methemoglobin 0.8 0.0 - 1.5 %  .Cooxemetry Panel (carboxy, met, total hgb, O2 sat)     Status: Abnormal   Collection Time: 04/29/19  3:20 AM  Result Value Ref Range    Total hemoglobin 8.0 (L) 12.0 - 16.0 g/dL   O2 Saturation 60.8 %   Carboxyhemoglobin 2.1 (H) 0.5 - 1.5 %   Methemoglobin 0.8 0.0 - 1.5 %   Dg Chest Port 1 View  Result Date: 04/28/2019 CLINICAL DATA:  Status post coronary artery bypass graft. EXAM: PORTABLE CHEST 1 VIEW COMPARISON:  Radiograph of April 23, 2019. FINDINGS: Stable cardiomegaly. Sternotomy wires are noted. Stable position of left subclavian catheter. Right internal jugular catheter is noted. No pneumothorax or pleural effusion is noted. Left lung is clear. Mild right basilar atelectasis is noted. Bony thorax is unremarkable. IMPRESSION: Mild right basilar subsegmental atelectasis. Bilateral internal jugular catheters are noted. Electronically Signed   By: Marijo Conception M.D.   On: 04/28/2019 10:07   Vas US Duplex Dialysis Access (avf, Avg)  Result Date: 04/29/2019 DIALYSIS ACCESS Reason for Exam: Unable to dialyze through AVF/AVG. Access Site: Right Upper Extremity. Access Type: Brachial-cephalic AVF. Comparison Study: 03/02/19 Performing Technologist: June Leap RDMS, RVT  Examination Guidelines: A complete evaluation includes B-mode imaging, spectral Doppler, color Doppler, and power Doppler as needed of all accessible portions of each vessel. Unilateral testing is considered an integral part of a complete examination. Limited examinations for reoccurring indications may be performed as noted.  Findings: +--------------------+----------+-----------------+--------+  AVF                  PSV (cm/s) Flow Vol (mL/min) Comments  +--------------------+----------+-----------------+--------+  Native artery inflow    300           2216                  +--------------------+----------+-----------------+--------+  +------------+----------+-------------+----------+--------+  OUTFLOW VEIN PSV (cm/s) Diameter (cm) Depth (cm) Describe  +------------+----------+-------------+----------+--------+  Shoulder        243         0.90         0.71               +------------+----------+-------------+----------+--------+  Prox UA                     0.82         0.92              +------------+----------+-------------+----------+--------+  Mid UA          210         0.77         0.27              +------------+----------+-------------+----------+--------+  Dist UA         608         0.77         0.52              +------------+----------+-------------+----------+--------+  AC Fossa        381         0.96         1.30              +------------+----------+-------------+----------+--------+   Summary: Patent Right brachiocephalic fistula. Appears unchanged when compared to previous study.  *See table(s) above for measurements  and observations.  Diagnosing physician: Harold Barban MD Electronically signed by Harold Barban MD on 04/29/2019 at 7:02:39 AM.   --------------------------------------------------------------------------------   Final       Assessment/Plan Active Problems:   Acute ST elevation myocardial infarction (STEMI) involving left anterior descending (LAD) coronary artery (HCC)   Cardiogenic shock (HCC)   S/P CABG x 3   Acute ST elevation myocardial infarction (STEMI) of anterior wall (HCC)   Coronary artery disease  External hemorrhoids - Keep bowel movements soft with good bowel regiment and Metamucil twice daily - Sitz bath every 6 hours - treat conservatively - No emergent surgical invention indicated at this time.    We will sign off at this time.  If bleeding worsens and patient becomes hemodynamically unstable 2/2 these hemorrhoids please page [place].   Kalman Drape, [facility] 04/29/2019, 9:52 AM Pager: [phone] Consults: [phone] Mon-Fri 7:00 am-4:30 pm Sat-Sun 7:00 am-11:30 am

## 2019-04-29 NOTE — Progress Notes (Signed)
Nutrition Follow-up  DOCUMENTATION CODES:   Morbid obesity  INTERVENTION:   - Continue Magic cup TID with meals, each supplement provides 290 kcal and 9 grams of protein  - Encourage adequate PO intake  NUTRITION DIAGNOSIS:   Increased nutrient needs related to chronic illness as evidenced by estimated needs.  Ongoing, being addressed via oral nutrition supplements  GOAL:   Patient will meet greater than or equal to 90% of their needs  Progressing  MONITOR:   PO intake, Supplement acceptance, Labs, Weight trends, Skin, I & O's  REASON FOR ASSESSMENT:   Rounds    ASSESSMENT:   68 year old male who presented to the ED on 9/24 with chest pain. PMH of HTN, ESRD recently started on HD, GERD, gout, HLD. Code STEMI called by ED. Pt found to have CHF.  9/24 - s/p LHC and RHC, Impella placement, urgent CABG x 3 9/25 - CRRT initiated, diet advanced to clear liquids 9/27 - Impella removed 9/29 - diet advanced to Heart Healthy 10/2 - CRRT d/c  Noted plan for [facility] placement today and HD after. Possible d/c to CIR.  Spoke with pt at bedside. Pt reports that his appetite is improving. Pt states he is eating about half of the food on his meal trays which is his baseline PO intake at home. Pt reports he will eat the Magic Cups if he gets vanilla or orange flavor.  Weight down a total of 23 lbs since first measured weight on 9/25.  Meal Completion: 25-100% x last 8 recorded meals (averaging 66%)  Medications reviewed and include: B-complex with vitamin C, Dulcolax, Protonix, ferric gluconate with HD, Renvela, psyllium  Labs reviewed: hemoglobin 8.9, phosphorus 6.9  UOP: 250 ml x 24 hours I/O's: -2.3 L since admit  Diet Order:   Diet Order            Diet NPO time specified Except for: Sips with Meds  Diet effective midnight              EDUCATION NEEDS:   Education needs have been addressed  Skin:  Skin Assessment: Skin Integrity Issues: Skin Integrity  Issues: Incisions: left chest, right leg, right arm  Last BM:  04/29/19  Height:   Ht Readings from Last 1 Encounters:  04/20/19 6' (1.829 m)    Weight:   Wt Readings from Last 1 Encounters:  04/29/19 124 kg    Ideal Body Weight:  80.9 kg  BMI:  Body mass index is 37.08 kg/m.  Estimated Nutritional Needs:   Kcal:  2400-2600  Protein:  140-160 grams  Fluid:  1000 ml + UOP    Gaynell Face, MS, RD, LDN Inpatient Clinical Dietitian Pager: [phone] Weekend/After Hours: [phone]

## 2019-04-29 NOTE — Progress Notes (Signed)
Physical Therapy Treatment Patient Details Name: Alex Williams MRN: [id number] DOB: [birth year] Today's Date: 04/29/2019    History of Present Illness Pt admitted 04/16/19 with STEMI; worked up for multiple complications including shock, CHF. S/p emergent CABG x3 on 9/24; Impella placed 9/24-9/27; on CRRT 9/25-10/2. Plan for [facility] placement 10/7. Pt also with external hemorrhoids. PMH includes dentures, OSA, OA, HTN, gout, CKD.   PT Comments    Pt progressing with mobility. Able to amb 12' x2 requiring increasing assist as pt easily fatigued with DOE and BLE buckling, able to recover well with seated rest break; at high risk for falls. Pt remains motivated to participate and regain PLOF. Remains limited by generalized weakness, decreased activity tolerance and difficulty maintaining sternal precautions with functional tasks. Continue to recommend intensive CIR-level therapies to maximize functional mobility and independence prior to return home.   Follow Up Recommendations  CIR;Supervision/Assistance - 24 hour     Equipment Recommendations  Rolling walker with 5" wheels;3in1 (PT)    Recommendations for Other Services       Precautions / Restrictions Precautions Precautions: Fall;Sternal    Mobility  Bed Mobility Overal bed mobility: Needs Assistance Bed Mobility: Rolling;Sidelying to Sit Rolling: Supervision Sidelying to sit: Supervision;HOB elevated       General bed mobility comments: Cues to decrease WB thru UEs when coming to EOB  Transfers Overall transfer level: Needs assistance Equipment used: Rolling walker (2 wheeled) Transfers: Sit to/from Stand Sit to Stand: Min assist         General transfer comment: Reliant on momentum and minA to power into standing to RW; able to perform 3x sit<>stand during session, requiring increased assist with fatigue  Ambulation/Gait Ambulation/Gait assistance: Min assist, mod assist Gait Distance (Feet): 32 Feet(+32) Assistive  device: Rolling walker (2 wheeled) Gait Pattern/deviations: Step-through pattern;Decreased stride length;Trunk flexed;Wide base of support Gait velocity: Decreased Gait velocity interpretation: <1.31 ft/sec, indicative of household ambulator General Gait Details: Slow, unsteady gait requiring increased assist to maintain balance with fatigue and BLE buckling noted. 1x prolonged seated rest break to recover between gait trials. SpO2 >90% on RA; DOE up to 3/4. Difficulty gripping RW with L hand due to swelling   Stairs             Wheelchair Mobility    Modified Rankin (Stroke Patients Only)       Balance Overall balance assessment: Needs assistance Sitting-balance support: No upper extremity supported;Feet supported Sitting balance-Leahy Scale: Fair       Standing balance-Leahy Scale: Poor Standing balance comment: Heavy reliance on BUE support                            Cognition Arousal/Alertness: Awake/alert Behavior During Therapy: WFL for tasks assessed/performed Overall Cognitive Status: Within Functional Limits for tasks assessed                                        Exercises General Exercises - Lower Extremity Long Arc Quad: AROM;Both;10 reps;Seated Hip Flexion/Marching: AROM;Both;10 reps;Seated    General Comments General comments (skin integrity, edema, etc.): Decreased grips strength and sensation in L hand since sx, educ on fine motor control exercises      Pertinent Vitals/Pain Pain Assessment: Faces Faces Pain Scale: Hurts a little bit Pain Location: External hemorrhoids Pain Descriptors / Indicators: Discomfort;Grimacing;Guarding Pain Intervention(s): Monitored during  session;Repositioned    Home Living                      Prior Function            PT Goals (current goals can now be found in the care plan section) Acute Rehab PT Goals Patient Stated Goal: to go home  PT Goal Formulation: With  patient Time For Goal Achievement: 05/08/19 Potential to Achieve Goals: Good Progress towards PT goals: Progressing toward goals    Frequency    Min 3X/week      PT Plan Current plan remains appropriate    Co-evaluation              AM-PAC PT "6 Clicks" Mobility   Outcome Measure  Help needed turning from your back to your side while in a flat bed without using bedrails?: A Little Help needed moving from lying on your back to sitting on the side of a flat bed without using bedrails?: A Little Help needed moving to and from a bed to a chair (including a wheelchair)?: A Little Help needed standing up from a chair using your arms (e.g., wheelchair or bedside chair)?: A Little Help needed to walk in hospital room?: A Little Help needed climbing 3-5 steps with a railing? : A Lot 6 Click Score: 17    End of Session Equipment Utilized During Treatment: Gait belt Activity Tolerance: Patient tolerated treatment well Patient left: in chair;with call bell/phone within reach Nurse Communication: Mobility status PT Visit Diagnosis: Unsteadiness on feet (R26.81);Muscle weakness (generalized) (M62.81);Pain     Time: [id number] PT Time Calculation (min) (ACUTE ONLY): 27 min  Charges:  $Gait Training: 8-22 mins $Therapeutic Exercise: 8-22 mins                    Mabeline Caras, PT, DPT Acute Rehabilitation Services  Pager [phone] [place] 04/29/2019, 11:05 AM

## 2019-04-30 ENCOUNTER — Inpatient Hospital Stay (HOSPITAL_COMMUNITY)
Admission: RE | Admit: 2019-04-30 | Discharge: 2019-05-09 | DRG: 945 | Disposition: A | Discharge status: Home Health | Payer: Medicare Other | Source: Intra-hospital | Attending: Physical Medicine and Rehabilitation | Admitting: Physical Medicine and Rehabilitation

## 2019-04-30 ENCOUNTER — Encounter (HOSPITAL_COMMUNITY): Payer: Self-pay | Admitting: Vascular Surgery

## 2019-04-30 DIAGNOSIS — Z992 Dependence on renal dialysis: Secondary | ICD-10-CM | POA: Diagnosis not present

## 2019-04-30 DIAGNOSIS — I2510 Atherosclerotic heart disease of native coronary artery without angina pectoris: Secondary | ICD-10-CM | POA: Diagnosis present

## 2019-04-30 DIAGNOSIS — I132 Hypertensive heart and chronic kidney disease with heart failure and with stage 5 chronic kidney disease, or end stage renal disease: Secondary | ICD-10-CM | POA: Diagnosis present

## 2019-04-30 DIAGNOSIS — N186 End stage renal disease: Secondary | ICD-10-CM | POA: Diagnosis present

## 2019-04-30 DIAGNOSIS — M25569 Pain in unspecified knee: Secondary | ICD-10-CM | POA: Diagnosis present

## 2019-04-30 DIAGNOSIS — N2581 Secondary hyperparathyroidism of renal origin: Secondary | ICD-10-CM | POA: Diagnosis present

## 2019-04-30 DIAGNOSIS — D62 Acute posthemorrhagic anemia: Secondary | ICD-10-CM | POA: Diagnosis present

## 2019-04-30 DIAGNOSIS — K219 Gastro-esophageal reflux disease without esophagitis: Secondary | ICD-10-CM | POA: Diagnosis present

## 2019-04-30 DIAGNOSIS — I959 Hypotension, unspecified: Secondary | ICD-10-CM | POA: Diagnosis present

## 2019-04-30 DIAGNOSIS — R5381 Other malaise: Secondary | ICD-10-CM | POA: Diagnosis present

## 2019-04-30 DIAGNOSIS — Z951 Presence of aortocoronary bypass graft: Secondary | ICD-10-CM

## 2019-04-30 DIAGNOSIS — I2102 ST elevation (STEMI) myocardial infarction involving left anterior descending coronary artery: Secondary | ICD-10-CM | POA: Diagnosis present

## 2019-04-30 DIAGNOSIS — M545 Low back pain: Secondary | ICD-10-CM | POA: Diagnosis present

## 2019-04-30 DIAGNOSIS — D649 Anemia, unspecified: Secondary | ICD-10-CM | POA: Diagnosis present

## 2019-04-30 DIAGNOSIS — E785 Hyperlipidemia, unspecified: Secondary | ICD-10-CM | POA: Diagnosis present

## 2019-04-30 DIAGNOSIS — Z96652 Presence of left artificial knee joint: Secondary | ICD-10-CM | POA: Diagnosis present

## 2019-04-30 DIAGNOSIS — I953 Hypotension of hemodialysis: Secondary | ICD-10-CM

## 2019-04-30 DIAGNOSIS — Z8249 Family history of ischemic heart disease and other diseases of the circulatory system: Secondary | ICD-10-CM

## 2019-04-30 DIAGNOSIS — I4891 Unspecified atrial fibrillation: Secondary | ICD-10-CM | POA: Diagnosis present

## 2019-04-30 DIAGNOSIS — Z6838 Body mass index (BMI) 38.0-38.9, adult: Secondary | ICD-10-CM

## 2019-04-30 DIAGNOSIS — M25561 Pain in right knee: Secondary | ICD-10-CM | POA: Diagnosis present

## 2019-04-30 DIAGNOSIS — K5900 Constipation, unspecified: Secondary | ICD-10-CM | POA: Diagnosis present

## 2019-04-30 DIAGNOSIS — Z86010 Personal history of colonic polyps: Secondary | ICD-10-CM

## 2019-04-30 DIAGNOSIS — I5022 Chronic systolic (congestive) heart failure: Secondary | ICD-10-CM | POA: Diagnosis present

## 2019-04-30 DIAGNOSIS — G8929 Other chronic pain: Secondary | ICD-10-CM | POA: Diagnosis present

## 2019-04-30 DIAGNOSIS — E669 Obesity, unspecified: Secondary | ICD-10-CM | POA: Diagnosis present

## 2019-04-30 DIAGNOSIS — D631 Anemia in chronic kidney disease: Secondary | ICD-10-CM | POA: Diagnosis present

## 2019-04-30 DIAGNOSIS — Z79899 Other long term (current) drug therapy: Secondary | ICD-10-CM

## 2019-04-30 DIAGNOSIS — Z885 Allergy status to narcotic agent status: Secondary | ICD-10-CM | POA: Diagnosis not present

## 2019-04-30 DIAGNOSIS — Z833 Family history of diabetes mellitus: Secondary | ICD-10-CM | POA: Diagnosis not present

## 2019-04-30 DIAGNOSIS — E7800 Pure hypercholesterolemia, unspecified: Secondary | ICD-10-CM | POA: Diagnosis present

## 2019-04-30 DIAGNOSIS — Z87891 Personal history of nicotine dependence: Secondary | ICD-10-CM

## 2019-04-30 DIAGNOSIS — K649 Unspecified hemorrhoids: Secondary | ICD-10-CM | POA: Diagnosis present

## 2019-04-30 DIAGNOSIS — G5692 Unspecified mononeuropathy of left upper limb: Secondary | ICD-10-CM | POA: Diagnosis present

## 2019-04-30 DIAGNOSIS — G629 Polyneuropathy, unspecified: Secondary | ICD-10-CM | POA: Diagnosis present

## 2019-04-30 LAB — CBC WITH DIFFERENTIAL/PLATELET
Abs Immature Granulocytes: 0.08 10*3/uL — ABNORMAL HIGH (ref 0.00–0.07)
Basophils Absolute: 0.1 10*3/uL (ref 0.0–0.1)
Basophils Relative: 0 %
Eosinophils Absolute: 0.2 10*3/uL (ref 0.0–0.5)
Eosinophils Relative: 2 %
HCT: 24.2 % — ABNORMAL LOW (ref 39.0–52.0)
Hemoglobin: 7.3 g/dL — ABNORMAL LOW (ref 13.0–17.0)
Immature Granulocytes: 1 %
Lymphocytes Relative: 17 %
Lymphs Abs: 2.1 10*3/uL (ref 0.7–4.0)
MCH: 28.3 pg (ref 26.0–34.0)
MCHC: 30.2 g/dL (ref 30.0–36.0)
MCV: 93.8 fL (ref 80.0–100.0)
Monocytes Absolute: 1.3 10*3/uL — ABNORMAL HIGH (ref 0.1–1.0)
Monocytes Relative: 11 %
Neutro Abs: 8.6 10*3/uL — ABNORMAL HIGH (ref 1.7–7.7)
Neutrophils Relative %: 69 %
Platelets: 297 10*3/uL (ref 150–400)
RBC: 2.58 MIL/uL — ABNORMAL LOW (ref 4.22–5.81)
RDW: 16.8 % — ABNORMAL HIGH (ref 11.5–15.5)
WBC: 12.3 10*3/uL — ABNORMAL HIGH (ref 4.0–10.5)
nRBC: 0 % (ref 0.0–0.2)

## 2019-04-30 LAB — BASIC METABOLIC PANEL
Anion gap: 12 (ref 5–15)
BUN: 27 mg/dL — ABNORMAL HIGH (ref 8–23)
CO2: 26 mmol/L (ref 22–32)
Calcium: 9.9 mg/dL (ref 8.9–10.3)
Chloride: 100 mmol/L (ref 98–111)
Creatinine, Ser: 4.85 mg/dL — ABNORMAL HIGH (ref 0.61–1.24)
GFR calc Af Amer: 13 mL/min — ABNORMAL LOW (ref >60)
GFR calc non Af Amer: 11 mL/min — ABNORMAL LOW (ref >60)
Glucose, Bld: 84 mg/dL (ref 70–99)
Potassium: 4.2 mmol/L (ref 3.5–5.1)
Sodium: 138 mmol/L (ref 135–145)

## 2019-04-30 LAB — COOXEMETRY PANEL
Carboxyhemoglobin: 2.7 % — ABNORMAL HIGH (ref 0.5–1.5)
Carboxyhemoglobin: 2.4 % — ABNORMAL HIGH (ref 0.5–1.5)
Methemoglobin: 1.3 % (ref 0.0–1.5)
Methemoglobin: 0.5 % (ref 0.0–1.5)
O2 Saturation: 66.1 %
O2 Saturation: 58 %
Total hemoglobin: 4.3 g/dL — CL (ref 12.0–16.0)
Total hemoglobin: 9.6 g/dL — ABNORMAL LOW (ref 12.0–16.0)

## 2019-04-30 MED ORDER — AMIODARONE HCL 200 MG PO TABS
200.0000 mg | ORAL_TABLET | Freq: Two times a day (BID) | ORAL | Status: DC
  Administered 2019-04-30 – 2019-05-09 (×18): 200 mg via ORAL
  Filled 2019-04-30 (×18): qty 1

## 2019-04-30 MED ORDER — HYDROCORTISONE (PERIANAL) 2.5 % EX CREA: TOPICAL_CREAM | CUTANEOUS | 0 refills | Status: DC | PRN

## 2019-04-30 MED ORDER — WITCH HAZEL-GLYCERIN EX PADS: MEDICATED_PAD | CUTANEOUS | 12 refills | Status: DC | PRN

## 2019-04-30 MED ORDER — CINACALCET HCL 30 MG PO TABS
30.0000 mg | ORAL_TABLET | Freq: Every day | ORAL | Status: DC
  Administered 2019-05-02 – 2019-05-08 (×6): 30 mg via ORAL
  Filled 2019-04-30 (×6): qty 1

## 2019-04-30 MED ORDER — CINACALCET HCL 30 MG PO TABS: 30.0000 mg | ORAL_TABLET | Freq: Every day | ORAL | 0 refills | Status: DC

## 2019-04-30 MED ORDER — LACTATED RINGERS IV SOLN: 10.0000 mL | INTRAVENOUS | 0 refills | Status: DC

## 2019-04-30 MED ORDER — ENSURE ENLIVE PO LIQD: 237.0000 mL | Freq: Two times a day (BID) | ORAL | Status: DC

## 2019-04-30 MED ORDER — ONDANSETRON HCL 4 MG/2ML IJ SOLN: 4.0000 mg | Freq: Four times a day (QID) | INTRAMUSCULAR | 0 refills | Status: DC | PRN

## 2019-04-30 MED ORDER — TRAMADOL HCL 50 MG PO TABS
50.0000 mg | ORAL_TABLET | Freq: Two times a day (BID) | ORAL | Status: DC | PRN
  Administered 2019-04-30 – 2019-05-02 (×3): 50 mg via ORAL
  Filled 2019-04-30 (×3): qty 1

## 2019-04-30 MED ORDER — AMIODARONE HCL 200 MG PO TABS: 200.0000 mg | ORAL_TABLET | Freq: Two times a day (BID) | ORAL | 1 refills | Status: DC

## 2019-04-30 MED ORDER — WITCH HAZEL-GLYCERIN EX PADS: MEDICATED_PAD | CUTANEOUS | Status: DC | PRN

## 2019-04-30 MED ORDER — HEPARIN SODIUM (PORCINE) 1000 UNIT/ML IJ SOLN
1000.0000 [IU] | INTRAMUSCULAR | Status: DC
  Administered 2019-05-06 – 2019-05-08 (×2): 1000 [IU] via INTRAVENOUS
  Filled 2019-04-30 (×4): qty 1

## 2019-04-30 MED ORDER — APIXABAN 5 MG PO TABS
5.0000 mg | ORAL_TABLET | Freq: Two times a day (BID) | ORAL | Status: DC
  Administered 2019-04-30 – 2019-05-09 (×18): 5 mg via ORAL
  Filled 2019-04-30 (×18): qty 1

## 2019-04-30 MED ORDER — ALLOPURINOL 100 MG PO TABS
100.0000 mg | ORAL_TABLET | Freq: Every day | ORAL | Status: DC
  Administered 2019-05-01 – 2019-05-09 (×9): 100 mg via ORAL
  Filled 2019-04-30 (×9): qty 1

## 2019-04-30 MED ORDER — BISOPROLOL FUMARATE 5 MG PO TABS
2.5000 mg | ORAL_TABLET | ORAL | Status: DC
  Administered 2019-05-02 – 2019-05-09 (×5): 2.5 mg via ORAL
  Filled 2019-04-30 (×5): qty 0.5

## 2019-04-30 MED ORDER — BISACODYL 5 MG PO TBEC
10.0000 mg | DELAYED_RELEASE_TABLET | Freq: Every day | ORAL | Status: DC
  Administered 2019-05-01 – 2019-05-09 (×9): 10 mg via ORAL
  Filled 2019-04-30 (×9): qty 2

## 2019-04-30 MED ORDER — ASPIRIN 81 MG PO TBEC: 81.0000 mg | DELAYED_RELEASE_TABLET | Freq: Every day | ORAL | Status: DC

## 2019-04-30 MED ORDER — DARBEPOETIN ALFA 60 MCG/0.3ML IJ SOSY
60.0000 ug | PREFILLED_SYRINGE | INTRAMUSCULAR | Status: DC
  Administered 2019-05-04: 60 ug via INTRAVENOUS
  Filled 2019-04-30: qty 0.3

## 2019-04-30 MED ORDER — BISOPROLOL FUMARATE 5 MG PO TABS
2.5000 mg | ORAL_TABLET | ORAL | Status: DC
  Administered 2019-04-30: 2.5 mg via ORAL
  Filled 2019-04-30: qty 1

## 2019-04-30 MED ORDER — APIXABAN 5 MG PO TABS: 5.0000 mg | ORAL_TABLET | Freq: Two times a day (BID) | ORAL | Status: DC

## 2019-04-30 MED ORDER — MIDODRINE HCL 5 MG PO TABS
5.0000 mg | ORAL_TABLET | ORAL | Status: DC
  Administered 2019-05-01 – 2019-05-06 (×3): 5 mg via ORAL
  Filled 2019-04-30 (×3): qty 1

## 2019-04-30 MED ORDER — ROSUVASTATIN CALCIUM 10 MG PO TABS: 10.0000 mg | ORAL_TABLET | Freq: Every day | ORAL | 1 refills | Status: DC

## 2019-04-30 MED ORDER — DARBEPOETIN ALFA 60 MCG/0.3ML IJ SOSY: 60.0000 ug | PREFILLED_SYRINGE | INTRAMUSCULAR | Status: DC

## 2019-04-30 MED ORDER — SEVELAMER CARBONATE 800 MG PO TABS
800.0000 mg | ORAL_TABLET | Freq: Three times a day (TID) | ORAL | Status: DC
  Administered 2019-05-01 – 2019-05-09 (×22): 800 mg via ORAL
  Filled 2019-04-30 (×23): qty 1

## 2019-04-30 MED ORDER — MENTHOL 3 MG MT LOZG: 1.0000 | LOZENGE | OROMUCOSAL | 12 refills | Status: DC | PRN

## 2019-04-30 MED ORDER — OXYCODONE HCL 5 MG PO TABS
5.0000 mg | ORAL_TABLET | Freq: Four times a day (QID) | ORAL | Status: DC | PRN
  Administered 2019-05-01 – 2019-05-08 (×10): 5 mg via ORAL
  Filled 2019-04-30 (×11): qty 1

## 2019-04-30 MED ORDER — PSYLLIUM 95 % PO PACK
1.0000 | PACK | Freq: Two times a day (BID) | ORAL | Status: DC
  Administered 2019-04-30 – 2019-05-07 (×10): 1 via ORAL
  Filled 2019-04-30 (×18): qty 1

## 2019-04-30 MED ORDER — HYDROCORTISONE (PERIANAL) 2.5 % EX CREA: TOPICAL_CREAM | CUTANEOUS | Status: DC | PRN

## 2019-04-30 MED ORDER — FLUTICASONE PROPIONATE 50 MCG/ACT NA SUSP
2.0000 | Freq: Every day | NASAL | Status: DC
  Administered 2019-05-01 – 2019-05-09 (×9): 2 via NASAL
  Filled 2019-04-30: qty 16

## 2019-04-30 MED ORDER — B COMPLEX-C PO TABS: 1.0000 | ORAL_TABLET | Freq: Every day | ORAL | Status: DC

## 2019-04-30 MED ORDER — MIDODRINE HCL 5 MG PO TABS: 5.0000 mg | ORAL_TABLET | ORAL | 0 refills | Status: DC

## 2019-04-30 MED ORDER — CHLORHEXIDINE GLUCONATE CLOTH 2 % EX PADS: 6.0000 | MEDICATED_PAD | Freq: Every day | CUTANEOUS | Status: DC

## 2019-04-30 MED ORDER — ROSUVASTATIN CALCIUM 5 MG PO TABS
10.0000 mg | ORAL_TABLET | Freq: Every day | ORAL | Status: DC
  Administered 2019-05-02 – 2019-05-08 (×6): 10 mg via ORAL
  Filled 2019-04-30 (×6): qty 2

## 2019-04-30 MED ORDER — AMIODARONE HCL 200 MG PO TABS: 200.0000 mg | ORAL_TABLET | Freq: Two times a day (BID) | ORAL | Status: DC

## 2019-04-30 MED ORDER — SODIUM CHLORIDE 0.9 % IV SOLN: 125.0000 mg | INTRAVENOUS | Status: DC

## 2019-04-30 MED ORDER — PANTOPRAZOLE SODIUM 40 MG PO TBEC
40.0000 mg | DELAYED_RELEASE_TABLET | Freq: Every day | ORAL | Status: DC
  Administered 2019-05-01 – 2019-05-09 (×9): 40 mg via ORAL
  Filled 2019-04-30 (×9): qty 1

## 2019-04-30 MED ORDER — SEVELAMER CARBONATE 800 MG PO TABS: 800.0000 mg | ORAL_TABLET | Freq: Three times a day (TID) | ORAL | Status: DC

## 2019-04-30 MED ORDER — ASPIRIN EC 81 MG PO TBEC
81.0000 mg | DELAYED_RELEASE_TABLET | Freq: Every day | ORAL | Status: DC
  Administered 2019-05-01 – 2019-05-09 (×9): 81 mg via ORAL
  Filled 2019-04-30 (×9): qty 1

## 2019-04-30 MED ORDER — BISACODYL 10 MG RE SUPP
10.0000 mg | Freq: Every day | RECTAL | Status: DC
  Filled 2019-04-30 (×2): qty 1

## 2019-04-30 MED ORDER — BISOPROLOL FUMARATE 5 MG PO TABS: 2.5000 mg | ORAL_TABLET | Freq: Every day | ORAL | 1 refills | Status: DC

## 2019-04-30 MED ORDER — TRAMADOL HCL 50 MG PO TABS: 50.0000 mg | ORAL_TABLET | Freq: Two times a day (BID) | ORAL | 0 refills | Status: DC | PRN

## 2019-04-30 MED ORDER — B COMPLEX-C PO TABS
1.0000 | ORAL_TABLET | Freq: Every day | ORAL | Status: DC
  Administered 2019-05-01 – 2019-05-06 (×6): 1 via ORAL
  Filled 2019-04-30 (×6): qty 1

## 2019-04-30 MED ORDER — SORBITOL 70 % SOLN
30.0000 mL | Freq: Every day | Status: DC | PRN
  Administered 2019-05-03 – 2019-05-08 (×2): 30 mL via ORAL
  Filled 2019-04-30 (×2): qty 30

## 2019-04-30 MED ORDER — BISACODYL 5 MG PO TBEC: 10.0000 mg | DELAYED_RELEASE_TABLET | Freq: Every day | ORAL | 0 refills | Status: DC

## 2019-04-30 MED ORDER — HEPARIN SODIUM (PORCINE) 1000 UNIT/ML IJ SOLN: 1000.0000 [IU] | INTRAMUSCULAR | Status: DC | PRN

## 2019-04-30 MED ORDER — SODIUM CHLORIDE 0.9 % IV SOLN
125.0000 mg | INTRAVENOUS | Status: DC
  Administered 2019-05-04: 125 mg via INTRAVENOUS
  Filled 2019-04-30 (×5): qty 10

## 2019-04-30 MED ORDER — PANTOPRAZOLE SODIUM 40 MG PO TBEC: 40.0000 mg | DELAYED_RELEASE_TABLET | Freq: Every day | ORAL | Status: DC

## 2019-04-30 MED ORDER — SODIUM CHLORIDE 0.9% FLUSH: 3.0000 mL | INTRAVENOUS | Status: DC | PRN

## 2019-04-30 MED ORDER — PSYLLIUM 95 % PO PACK: 1.0000 | PACK | Freq: Two times a day (BID) | ORAL | Status: DC

## 2019-04-30 NOTE — H&P (Signed)
Physical Medicine and Rehabilitation Admission H&P    Chief Complaint  Patient presents with  . Chest Pain  : HPI: Alex Williams is a 68 year old right-handed male with history of chronic kidney disease with hemodialysis, systolic congestive heart failure, hyperlipidemia, hypertension, low back pain.  History taken from chart review and patient.  Patient lives alone and was independent PTA. 1 level home 3 steps to entry.  Plans to stay with his sister and assistance as needed on discharge.  Presented 04/16/2019 with chest pain and palpitations.  Echocardiogram performed which showed an ejection fraction of approximately 25%.  Suspect cardiogenic shock in setting of atypical atrial flutter with RVR and ischemia.  Cardiac catheterization 90% distal left main stenosis into the LCx, totally occluded mid LAD.  Initial attempts at angioplasty but unable to restore flow.  Patient underwent emergent medius sternotomy and CABG x3 on 04/16/2019 with Dr. Kipp Brood.  Hospital course complicated by pain management.  Hemodialysis ongoing after initially receiving CRRT and patient did receive a tunneled dialysis catheter 04/29/2019 per Dr. Donnetta Hutching..  Acute on chronic anemia 7.4 patient did receive 1 unit packed red blood cells 04/27/2019.  Maintained on Eliquis as well as low-dose aspirin.  Cardiac rate remained controlled patient continues on amiodarone follow-up per cardiology services.  Close monitoring of blood pressure continues on ProAmatine Monday Wednesday Fridays.  Tolerating a regular consistency diet.  Therapy evaluations completed and patient was admitted for a comprehensive rehab program.  Please see preadmission assessment pulmonary to nasal.  Review of Systems  Constitutional: Negative for chills and fever.  HENT: Negative for hearing loss.   Eyes: Negative for blurred vision and double vision.  Respiratory: Positive for cough and shortness of breath.   Cardiovascular: Positive for chest pain,  palpitations and leg swelling.  Gastrointestinal: Positive for constipation. Negative for heartburn, nausea and vomiting.       GERD  Genitourinary: Negative for dysuria, flank pain and hematuria.  Musculoskeletal: Positive for back pain and myalgias.  Skin: Negative for rash.  All other systems reviewed and are negative.  Past Medical History:  Diagnosis Date  . Cervical disc disease   . Chronic kidney disease   . COLONIC POLYPS, HX OF 06/27/2007  . EXOGENOUS OBESITY 01/30/2010  . GERD (gastroesophageal reflux disease)    PMH  . GOUT 01/30/2010  . HYPERCHOLESTEROLEMIA 06/30/2007  . HYPERTENSION 06/27/2007  . LOW BACK PAIN 06/27/2007  . NEPHROLITHIASIS, HX OF 06/27/2007  . OSTEOARTHRITIS 06/27/2007  . SLEEP APNEA, OBSTRUCTIVE, MODERATE 01/27/2009  . Wears dentures    upper   Past Surgical History:  Procedure Laterality Date  . AV FISTULA PLACEMENT Right 01/19/2019   Procedure: RIGHT BRACHIOCEPHALIC ARTERIOVENOUS (AV) FISTULA CREATION;  Surgeon: Angelia Mould, MD;  Location: [facility];  Service: Vascular;  Laterality: Right;  . CARDIAC CATHETERIZATION    . CARPAL TUNNEL RELEASE     left hand  . COLONOSCOPY     with polypectomy  . CORONARY ARTERY BYPASS GRAFT N/A 04/16/2019   Procedure: CORONARY ARTERY BYPASS GRAFTING (CABG)X3  , WITH ENDOSCOPIC HARVESTING OF RIGHT GREATER SAPHENOUS VEIN;  Surgeon: Lajuana Matte, MD;  Location: [facility];  Service: Open Heart Surgery;  Laterality: N/A;  . CORONARY/GRAFT ACUTE MI REVASCULARIZATION N/A 04/16/2019   Procedure: CORONARY/GRAFT ACUTE MI REVASCULARIZATION;  Surgeon: Burnell Blanks, MD;  Location: [facility];  Service: Cardiovascular;  Laterality: N/A;  . INSERTION OF DIALYSIS CATHETER N/A 04/29/2019   Procedure: INSERTION OF TUNNELED DIALYSIS CATHETER,  right internal jugular;  Surgeon: Rosetta Posner, MD;  Location: [facility];  Service: Vascular;  Laterality: N/A;  . JOINT REPLACEMENT Left 2005   knee  . LUMBAR LAMINECTOMY     . MULTIPLE TOOTH EXTRACTIONS    . RADIOFREQUENCY ABLATION KIDNEY    . RIGHT/LEFT HEART CATH AND CORONARY ANGIOGRAPHY N/A 04/16/2019   Procedure: RIGHT/LEFT HEART CATH AND CORONARY ANGIOGRAPHY;  Surgeon: Burnell Blanks, MD;  Location: [facility];  Service: Cardiovascular;  Laterality: N/A;  . TEE WITHOUT CARDIOVERSION N/A 04/16/2019   Procedure: TRANSESOPHAGEAL ECHOCARDIOGRAM (TEE);  Surgeon: Lajuana Matte, MD;  Location: [facility];  Service: Open Heart Surgery;  Laterality: N/A;  . TOTAL KNEE ARTHROPLASTY     left  . VENTRICULAR ASSIST DEVICE INSERTION N/A 04/16/2019   Procedure: VENTRICULAR ASSIST DEVICE INSERTION;  Surgeon: Burnell Blanks, MD;  Location: [facility];  Service: Cardiovascular;  Laterality: N/A;   Family History  Problem Relation Age of Onset  . Hypertension Other   . Diabetes Sister   . Hyperlipidemia Sister   . Sleep apnea Sister    Social History:  reports that he quit smoking about 38 years ago. He has never used smokeless tobacco. He reports current alcohol use of about 1.0 standard drinks of alcohol per week. He reports that he does not use drugs. Allergies:  Allergies  Allergen Reactions  . Codeine Phosphate Other (See Comments)    Hyperactive    Medications Prior to Admission  Medication Sig Dispense Refill  . acetaminophen (TYLENOL) 500 MG tablet Take 1,000 mg by mouth 2 (two) times a day.     Marland Kitchen amLODipine (NORVASC) 10 MG tablet Take 1 tablet (10 mg total) by mouth daily. (Patient taking differently: Take 5 mg by mouth at bedtime. ) 90 tablet 3  . febuxostat (ULORIC) 40 MG tablet Take 40 mg by mouth every Monday.    . ferric citrate (AURYXIA) 1 GM 210 MG(Fe) tablet Take 210 mg by mouth 3 (three) times daily with meals.    . fluticasone (FLONASE) 50 MCG/ACT nasal spray Place 2 sprays into both nostrils daily. (Patient taking differently: Place 2 sprays into both nostrils daily as needed for allergies or rhinitis. ) 16 g 6  .  furosemide (LASIX) 80 MG tablet Take 2 tablets (160 mg total) by mouth 2 (two) times daily. (Patient taking differently: Take 80 mg by mouth See admin instructions. Take one tablet (80 mg) by mouth on Saturday or Sunday if needed for leg swelling (not on weekdays)) 120 tablet 0  . hydrocortisone cream 1 % Apply 1 application topically at bedtime.    . hydrOXYzine (ATARAX/VISTARIL) 25 MG tablet TAKE 1 TABLET (25 MG TOTAL) BY MOUTH 3 (THREE) TIMES DAILY AS NEEDED FOR ITCHING (INSOMNIA). (Patient taking differently: Take 25 mg by mouth at bedtime. For itching/insomnia) 30 tablet 0  . Multiple Vitamin (MULTIVITAMIN WITH MINERALS) TABS tablet Take 1 tablet by mouth daily. One a Day Men's    . rosuvastatin (CRESTOR) 5 MG tablet Take 1 tablet (5 mg total) by mouth daily. (Patient taking differently: Take 5 mg by mouth every 3 (three) days. ) 90 tablet 2  . terazosin (HYTRIN) 1 MG capsule Take 2 capsules (2 mg total) by mouth at bedtime. 180 capsule 1  . traZODone (DESYREL) 50 MG tablet TAKE 1/2 TO 1 TABLET AT BEDTIME AS NEEDED FOR SLEEP (Patient taking differently: Take 50 mg by mouth at bedtime. ) 90 tablet 0  . sodium  bicarbonate 650 MG tablet Take 1 tablet (650 mg total) by mouth 2 (two) times daily. (Patient not taking: Reported on 04/17/2019) 180 tablet 4    Drug Regimen Review Drug regimen was reviewed and remains appropriate with no significant issues identified  Home: Home Living Family/patient expects to be discharged to:: Private residence Living Arrangements: Alone Available Help at Discharge: Family, Available 24 hours/day(plans to dc to sisters home ) Type of Home: House Home Access: Stairs to enter Technical brewer of Steps: 3 Entrance Stairs-Rails: Right, Left, Can reach both Home Layout: One level Bathroom Shower/Tub: Multimedia programmer: Standard Home Equipment: None   Functional History: Prior Function Level of Independence: Independent Comments: driving ,  semi retired  Functional Status:  Mobility: Bed Mobility Overal bed mobility: Needs Assistance Bed Mobility: Rolling, Sidelying to Sit Rolling: Supervision Sidelying to sit: Supervision, HOB elevated General bed mobility comments: Cues to decrease WB thru UEs when coming to EOB Transfers Overall transfer level: Needs assistance Equipment used: Rolling walker (2 wheeled) Transfers: Sit to/from Stand Sit to Stand: Min assist Stand pivot transfers: Min assist, +2 safety/equipment General transfer comment: Reliant on momentum and minA to power into standing to RW; able to perform 3x sit<>stand during session, requiring increased assist with fatigue Ambulation/Gait Ambulation/Gait assistance: Min assist Gait Distance (Feet): 32 Feet(+32) Assistive device: Rolling walker (2 wheeled) Gait Pattern/deviations: Step-through pattern, Decreased stride length, Trunk flexed, Wide base of support General Gait Details: Slow, unsteady gait requiring increased assist to maintain balance with fatigue and BLE buckling noted. 1x prolonged seated rest break to recover between gait trials. SpO2 >90% on RA; DOE up to 3/4. Difficulty gripping RW with L hand due to swelling Gait velocity: Decreased Gait velocity interpretation: <1.31 ft/sec, indicative of household ambulator    ADL: ADL Overall ADL's : Needs assistance/impaired Grooming: Set up, Sitting Upper Body Bathing: Set up, Sitting Lower Body Bathing: Minimal assistance, Sit to/from stand, +2 for safety/equipment Upper Body Dressing : Minimal assistance, Sitting Lower Body Dressing: Moderate assistance, Sit to/from stand Toilet Transfer: Ambulation, RW, Moderate assistance, +2 for physical assistance, +2 for safety/equipment Toilet Transfer Details (indicate cue type and reason): simulated to recliner  Toileting- Clothing Manipulation and Hygiene: Minimal assistance, Sit to/from stand Functional mobility during ADLs: Rolling walker, Minimal  assistance, +2 for safety/equipment General ADL Comments: pt limited by decreased activity tolerance, sternal precautions, generalized weakness, and back pain   Cognition: Cognition Overall Cognitive Status: Within Functional Limits for tasks assessed Orientation Level: (P) Oriented X4 Cognition Arousal/Alertness: Awake/alert Behavior During Therapy: WFL for tasks assessed/performed Overall Cognitive Status: Within Functional Limits for tasks assessed  Physical Exam: Blood pressure 116/76, pulse 87, temperature 98.4 F (36.9 C), temperature source Oral, resp. rate 18, height 6' (1.829 m), weight 124.7 kg, SpO2 100 %. Physical Exam  Vitals reviewed. Constitutional: He appears well-developed and well-nourished.  HENT:  Head: Normocephalic and atraumatic.  Eyes: EOM are normal. Right eye exhibits no discharge. Left eye exhibits no discharge.  Neck: No tracheal deviation present. No thyromegaly present.  Respiratory: Effort normal. No respiratory distress.  GI: He exhibits no distension.  Musculoskeletal:     Comments: Edema and bilateral lower extremities and left upper extremity.  Neurological: He is alert.  Follows full commands.   Cooperative with exam. Motor: Right upper extremity: 5/5 proximal distal Left upper extremity: Pulmonary 7/5 proximal distal Bilateral lower extremities: 4--4/5 proximal distal Sensation diminished to light touch left hand  Skin:  Midline chest incision C/D/I  Psychiatric: He  has a normal mood and affect. His behavior is normal.    Results for orders placed or performed during the hospital encounter of 04/16/19 (from the past 48 hour(s))  .Cooxemetry Panel (carboxy, met, total hgb, O2 sat)     Status: Abnormal   Collection Time: 04/29/19  3:20 AM  Result Value Ref Range   Total hemoglobin 8.0 (L) 12.0 - 16.0 g/dL   O2 Saturation 60.8 %   Carboxyhemoglobin 2.1 (H) 0.5 - 1.5 %   Methemoglobin 0.8 0.0 - 1.5 %  CBC     Status: Abnormal    Collection Time: 04/29/19  4:40 PM  Result Value Ref Range   WBC 12.6 (H) 4.0 - 10.5 K/uL   RBC 2.53 (L) 4.22 - 5.81 MIL/uL   Hemoglobin 7.4 (L) 13.0 - 17.0 g/dL   HCT 23.4 (L) 39.0 - 52.0 %   MCV 92.5 80.0 - 100.0 fL   MCH 29.2 26.0 - 34.0 pg   MCHC 31.6 30.0 - 36.0 g/dL   RDW 16.5 (H) 11.5 - 15.5 %   Platelets 300 150 - 400 K/uL   nRBC 0.0 0.0 - 0.2 %    Comment: Performed at [facility], [address], [place], [place] [address]  Renal function panel     Status: Abnormal   Collection Time: 04/29/19  7:33 PM  Result Value Ref Range   Sodium 137 135 - 145 mmol/L   Potassium 4.2 3.5 - 5.1 mmol/L   Chloride 101 98 - 111 mmol/L   CO2 23 22 - 32 mmol/L   Glucose, Bld 108 (H) 70 - 99 mg/dL   BUN 64 (H) 8 - 23 mg/dL   Creatinine, Ser 8.31 (H) 0.61 - 1.24 mg/dL   Calcium 11.3 (H) 8.9 - 10.3 mg/dL   Phosphorus 7.0 (H) 2.5 - 4.6 mg/dL   Albumin 2.6 (L) 3.5 - 5.0 g/dL   GFR calc non Af Amer 6 (L) >60 mL/min   GFR calc Af Amer 7 (L) >60 mL/min   Anion gap 13 5 - 15    Comment: Performed at [facility] [address], [place], [place] [address]  CBC with Differential/Platelet     Status: Abnormal   Collection Time: 04/30/19  4:50 AM  Result Value Ref Range   WBC 12.3 (H) 4.0 - 10.5 K/uL   RBC 2.58 (L) 4.22 - 5.81 MIL/uL   Hemoglobin 7.3 (L) 13.0 - 17.0 g/dL   HCT 24.2 (L) 39.0 - 52.0 %   MCV 93.8 80.0 - 100.0 fL   MCH 28.3 26.0 - 34.0 pg   MCHC 30.2 30.0 - 36.0 g/dL   RDW 16.8 (H) 11.5 - 15.5 %   Platelets 297 150 - 400 K/uL   nRBC 0.0 0.0 - 0.2 %   Neutrophils Relative % 69 %   Neutro Abs 8.6 (H) 1.7 - 7.7 K/uL   Lymphocytes Relative 17 %   Lymphs Abs 2.1 0.7 - 4.0 K/uL   Monocytes Relative 11 %   Monocytes Absolute 1.3 (H) 0.1 - 1.0 K/uL   Eosinophils Relative 2 %   Eosinophils Absolute 0.2 0.0 - 0.5 K/uL   Basophils Relative 0 %   Basophils Absolute 0.1 0.0 - 0.1 K/uL   Immature Granulocytes 1 %   Abs Immature Granulocytes 0.08 (H) 0.00 - 0.07 K/uL     Comment: Performed at [facility] [address], [place], [place] [address]  Basic metabolic panel  Status: Abnormal   Collection Time: 04/30/19  4:50 AM  Result Value Ref Range   Sodium 138 135 - 145 mmol/L   Potassium 4.2 3.5 - 5.1 mmol/L   Chloride 100 98 - 111 mmol/L   CO2 26 22 - 32 mmol/L   Glucose, Bld 84 70 - 99 mg/dL   BUN 27 (H) 8 - 23 mg/dL   Creatinine, Ser 4.85 (H) 0.61 - 1.24 mg/dL    Comment: DELTA CHECK NOTED   Calcium 9.9 8.9 - 10.3 mg/dL   GFR calc non Af Amer 11 (L) >60 mL/min   GFR calc Af Amer 13 (L) >60 mL/min   Anion gap 12 5 - 15    Comment: Performed at [facility] [address], [place], [place] [address]  .Cooxemetry Panel (carboxy, met, total hgb, O2 sat)     Status: Abnormal   Collection Time: 04/30/19  5:04 AM  Result Value Ref Range   Total hemoglobin 4.3 (LL) 12.0 - 16.0 g/dL    Comment: CORRECTED ON 10/08 AT 0530: PREVIOUSLY REPORTED AS 4.3 CRITICAL RESULT CALLED TO, READ BACK BY AND VERIFIED WITH: MICHELLE SHELTON, RRT CALLED ERIN HUENINK, RN @0527  ON 04/30/2019   O2 Saturation 66.1 %   Carboxyhemoglobin 2.7 (H) 0.5 - 1.5 %   Methemoglobin 1.3 0.0 - 1.5 %  .Cooxemetry Panel (carboxy, met, total hgb, O2 sat)     Status: Abnormal   Collection Time: 04/30/19  5:43 AM  Result Value Ref Range   Total hemoglobin 9.6 (L) 12.0 - 16.0 g/dL   O2 Saturation 58.0 %   Carboxyhemoglobin 2.4 (H) 0.5 - 1.5 %   Methemoglobin 0.5 0.0 - 1.5 %   Dg Chest Port 1 View  Result Date: 04/29/2019 CLINICAL DATA:  Status post dialysis catheter insertion. EXAM: PORTABLE CHEST 1 VIEW COMPARISON:  04/28/2019 FINDINGS: 1342 hours. Low volume film. The cardio pericardial silhouette is enlarged. There is pulmonary vascular congestion without overt pulmonary edema. No pneumothorax or pleural effusion. Left subclavian central line tip overlies the left innominate vein near the innominate vein confluence. Right IJ central line tip overlies the upper right atrium.  The visualized bony structures of the thorax are intact. Telemetry leads overlie the chest. IMPRESSION: 1. Low volume film with pulmonary vascular congestion. 2. Right IJ central line tip overlies the upper right atrium. Stable left subclavian line. Electronically Signed   By: Misty Stanley M.D.   On: 04/29/2019 15:34   Dg Fluoro Guide Cv Line-no Report  Result Date: 04/29/2019 Fluoroscopy was utilized by the requesting physician.  No radiographic interpretation.       Medical Problem List and Plan: 1.  Debility secondary to non-STEMI status post CABG x3 04/16/2019.  Sternal precautions   Admit to CIR 2.  Antithrombotics: -DVT/anticoagulation: Eliquis  -antiplatelet therapy: Aspirin 81 mg daily 3. Pain Management: Oxycodone/Ultram as needed 4. Mood: Provide emotional support  -antipsychotic agents: N/A 5. Neuropsych: This patient is capable of making decisions on his own behalf. 6. Skin/Wound Care: Routine skin checks 7. Fluids/Electrolytes/Nutrition: Routine in and outs.  CMP ordered for tomorrow a.m. 8.  Acute on chronic anemia.  CBC ordered for tomorrow a.m.  Continue Aranesp 9.  Atrial fibrillation with RVR.  Amiodarone 200 mg twice daily.  Cardiac rate controlled  Monitor with increased physical exertion. 10.  Hypotension.  ProAmatine 5 mg Monday Wednesday Friday  Monitor for symptoms 11.  ESRD on hemodialysis.  Status post tunneled catheter 04/29/2019 12.  Hyperlipidemia.  Crestor  13.  Constipation with hemorrhoids.  Laxative assistance.  Continue Tucks pads  Cathlyn Parsons, PA-C 04/30/2019  I have personally performed a face to face diagnostic evaluation, including, but not limited to relevant history and physical exam findings, of this patient and developed relevant assessment and plan.  Additionally, I have reviewed and concur with the physician assistant's documentation above.  Delice Lesch, MD, ABPMR  The patient's status has not changed. The original post admission  physician evaluation remains appropriate, and any changes from the pre-admission screening or documentation from the acute chart are noted above.   Delice Lesch, MD, ABPMR

## 2019-04-30 NOTE — Progress Notes (Signed)
Provide report to [place], [place] on 4W. Pt and belongings transferred to 4W room 2. Son at bedside

## 2019-04-30 NOTE — Progress Notes (Signed)
[facility] Progress Note   Assessment/ Plan:   Dialysis: [facility] MWF - new start, had OP HD on 9/21 and 9/23 prior to admission 3h 132kg 300/600 RUE AVF 2/2 bath Hep none venofer 100 mg ordered each treatment x 4,Hb 8.5 on 9/21 noESA order yet PTH 1366 on 04/15/19   Assessment/ Plan #Cardiogenic shock/STEMI: Status post emergent CABG on 9/24. SP Impella, removed 04/19/19. As per heart failure team and CT surgery.  - midodrine at 15 tid--> 5 TID now, trying to see if he could tolerate off so he could start BB--> but really needs midodrine before HD to help pull vol  # ESRD: new start had 2 OP sessions prior to admission. Required CRRT due to shock from 9/25 - 10/2.  AVF used twice at OP HD w/ sig difficulty per the staff.   - will need new EDW upon discharge - HD now on MWF schedule, CRRT off since 10/2 - VVS s/p TDC  (10/7), outpt fgram in 2 weeks, appreciate assistance  # Anemia: Due to ABLA and CKD: Transfuse bloodprn. Hb down < 8 will start darbe with 60 ug sq today then 60 ug IV q Mon.  Check fe tibc--> Tsat 14, start ferrlicet with HD [id number] for full course.   # Secondary hyperparathyroidism: hypercalcemia prob immobilization. Vit D has been on hold here.  PTH 1366 on 9/23 and OP Ca++ 11.3 9/23  - cont to follow - 2 Ca+ bath - start sensipar 30 mg QHS 10/7--> given high Ca and high PTH, may need OP parathyroid scan - start renvela 800 TID [facility] 10/7 - rec weekly Ca checks at OP HD center  # CAD sp CABG x 3 on 9/24, AS 81 mg daily  # Afib - on PO amio and eliquis  # ID - sp empiric IV abx, dc'd 10/2 - WBC up to 17.6 10/6, CTM, culture if febrile  # possible urinary retention: bladder scans, I/O cath if necessary   # Dispo: Ok from renal perspective for dispo   Subjective:    S/p TDC yesterday.  Sleeping this AM.  No complaints   Objective:   BP 116/76 (BP Location: Left Leg)   Pulse 87   Temp 98.4 F (36.9 C) (Oral)   Resp 18    Ht 6' (1.829 m)   Wt 124.7 kg   SpO2 100%   BMI 37.29 kg/m   Physical Exam: Gen: NAD, sitting in bed CVS: irregular, well-healed sternal scar Resp: clear bilaterally Abd: soft, nontender, NABS Ext: trace LE edema ACCESS: RUE AVF weak thrill/ bruit, R TDC with dressing c/d/i  Labs: BMET Recent Labs  Lab 04/24/19 1531 04/25/19 0417 04/25/19 1745 04/26/19 0429 04/26/19 1706 04/27/19 0315 04/29/19 1933 04/30/19 0450  NA 137 138 137 136 135 136 137 138  K 4.0 4.0 4.5 4.3 4.6 4.6 4.2 4.2  CL 102 101 102 100 99 101 101 100  CO2 26 26 24 24 23 23 23 26   GLUCOSE 98 98 132* 97 93 95 108* 84  BUN 22 33* 42* 53* 64* 69* 64* 27*  CREATININE 2.73* 3.93* 5.02* 6.00* 6.88* 7.58* 8.31* 4.85*  CALCIUM 12.0* 12.8* 13.1* 12.9* 12.7* 12.5* 11.3* 9.9  PHOS 3.6 4.7* 5.7* 6.4* 6.9* 6.9* 7.0*  --    CBC Recent Labs  Lab 04/26/19 0429 04/27/19 0314 04/28/19 0354 04/29/19 1640 04/30/19 0450  WBC 13.0* 12.2* 17.9* 12.6* 12.3*  NEUTROABS 8.2* 8.5* 12.3*  --  8.6*  HGB 7.5*  7.4* 8.9* 7.4* 7.3*  HCT 24.4* 23.5* 29.3* 23.4* 24.2*  MCV 92.4 91.4 92.4 92.5 93.8  PLT 293 285 402* 300 297    @IMGRELPRIORS @ Medications:    . sodium chloride   Intravenous Once  . allopurinol  100 mg Oral Daily  . amiodarone  400 mg Oral BID  . apixaban  5 mg Oral BID  . aspirin EC  81 mg Oral Daily  . B-complex with vitamin C  1 tablet Oral Daily  . bisacodyl  10 mg Oral Daily   Or  . bisacodyl  10 mg Rectal Daily  . Chlorhexidine Gluconate Cloth  6 each Topical Daily  . Chlorhexidine Gluconate Cloth  6 each Topical Q0600  . cinacalcet  30 mg Oral Q supper  . [START ON 05/04/2019] darbepoetin (ARANESP) injection - DIALYSIS  60 mcg Intravenous Q Mon-HD  . fluticasone  2 spray Each Nare Daily  . influenza vaccine adjuvanted  0.5 mL Intramuscular Tomorrow-1000  . mouth rinse  15 mL Mouth Rinse BID  . midodrine  5 mg Oral Q M,W,F  . pantoprazole  40 mg Oral Daily  . pneumococcal 23 valent vaccine  0.5  mL Intramuscular Tomorrow-1000  . psyllium  1 packet Oral BID  . rosuvastatin  10 mg Oral q1800  . sevelamer carbonate  800 mg Oral TID WC  . sodium chloride flush  10-40 mL Intracatheter Q12H  . sodium chloride flush  3 mL Intravenous Q12H     Madelon Lips, MD 04/30/2019, 10:07 AM

## 2019-04-30 NOTE — Progress Notes (Signed)
Placed patient on CPAP via nasal mask. 12.0 cm H20 per patient comfort. Tolerating well at this time.

## 2019-04-30 NOTE — H&P (Signed)
Physical Medicine and Rehabilitation Admission H&P    Chief Complaint  Patient presents with   Chest Pain  : HPI: Alex Williams is a 68 year old right-handed male with history of chronic kidney disease with hemodialysis, systolic congestive heart failure, hyperlipidemia, hypertension, low back pain.  History taken from chart review and patient.  Patient lives alone and was independent PTA. 1 level home 3 steps to entry.  Plans to stay with his sister and assistance as needed on discharge.  Presented 04/16/2019 with chest pain and palpitations.  Echocardiogram performed which showed an ejection fraction of approximately 25%.  Suspect cardiogenic shock in setting of atypical atrial flutter with RVR and ischemia.  Cardiac catheterization 90% distal left main stenosis into the LCx, totally occluded mid LAD.  Initial attempts at angioplasty but unable to restore flow.  Patient underwent emergent medius sternotomy and CABG x3 on 04/16/2019 with Dr. Kipp Brood.  Hospital course complicated by pain management.  Hemodialysis ongoing after initially receiving CRRT and patient did receive a tunneled dialysis catheter 04/29/2019 per Dr. Donnetta Hutching..  Acute on chronic anemia 7.4 patient did receive 1 unit packed red blood cells 04/27/2019.  Maintained on Eliquis as well as low-dose aspirin.  Cardiac rate remained controlled patient continues on amiodarone follow-up per cardiology services.  Close monitoring of blood pressure continues on ProAmatine Monday Wednesday Fridays.  Tolerating a regular consistency diet.  Therapy evaluations completed and patient was admitted for a comprehensive rehab program.  Please see preadmission assessment pulmonary to nasal.  Review of Systems  Constitutional: Negative for chills and fever.  HENT: Negative for hearing loss.   Eyes: Negative for blurred vision and double vision.  Respiratory: Positive for cough and shortness of breath.   Cardiovascular: Positive for chest pain,  palpitations and leg swelling.  Gastrointestinal: Positive for constipation. Negative for heartburn, nausea and vomiting.       GERD  Genitourinary: Negative for dysuria, flank pain and hematuria.  Musculoskeletal: Positive for back pain and myalgias.  Skin: Negative for rash.  All other systems reviewed and are negative.  Past Medical History:  Diagnosis Date   Cervical disc disease    Chronic kidney disease    COLONIC POLYPS, HX OF 06/27/2007   EXOGENOUS OBESITY 01/30/2010   GERD (gastroesophageal reflux disease)    PMH   GOUT 01/30/2010   HYPERCHOLESTEROLEMIA 06/30/2007   HYPERTENSION 06/27/2007   LOW BACK PAIN 06/27/2007   NEPHROLITHIASIS, HX OF 06/27/2007   OSTEOARTHRITIS 06/27/2007   SLEEP APNEA, OBSTRUCTIVE, MODERATE 01/27/2009   Wears dentures    upper   Past Surgical History:  Procedure Laterality Date   AV FISTULA PLACEMENT Right 01/19/2019   Procedure: RIGHT BRACHIOCEPHALIC ARTERIOVENOUS (AV) FISTULA CREATION;  Surgeon: Angelia Mould, MD;  Location: [facility] OR;  Service: Vascular;  Laterality: Right;   CARDIAC CATHETERIZATION     CARPAL TUNNEL RELEASE     left hand   COLONOSCOPY     with polypectomy   CORONARY ARTERY BYPASS GRAFT N/A 04/16/2019   Procedure: CORONARY ARTERY BYPASS GRAFTING (CABG)X3  , WITH ENDOSCOPIC HARVESTING OF RIGHT GREATER [place];  Surgeon: Lajuana Matte, MD;  Location: [place];  Service: Open Heart Surgery;  Laterality: N/A;   CORONARY/GRAFT ACUTE MI REVASCULARIZATION N/A 04/16/2019   Procedure: CORONARY/GRAFT ACUTE MI REVASCULARIZATION;  Surgeon: Burnell Blanks, MD;  Location: [place] [facility];  Service: Cardiovascular;  Laterality: N/A;   INSERTION OF DIALYSIS CATHETER N/A 04/29/2019   Procedure: INSERTION OF TUNNELED DIALYSIS CATHETER,  right internal jugular;  Surgeon: Rosetta Posner, MD;  Location: [facility] OR;  Service: Vascular;  Laterality: N/A;   JOINT REPLACEMENT Left 2005   knee   LUMBAR LAMINECTOMY      MULTIPLE TOOTH EXTRACTIONS     RADIOFREQUENCY ABLATION KIDNEY     RIGHT/LEFT HEART CATH AND CORONARY ANGIOGRAPHY N/A 04/16/2019   Procedure: RIGHT/LEFT HEART CATH AND CORONARY ANGIOGRAPHY;  Surgeon: Burnell Blanks, MD;  Location: [place] [facility];  Service: Cardiovascular;  Laterality: N/A;   TEE WITHOUT CARDIOVERSION N/A 04/16/2019   Procedure: TRANSESOPHAGEAL ECHOCARDIOGRAM (TEE);  Surgeon: Lajuana Matte, MD;  Location: [place];  Service: Open Heart Surgery;  Laterality: N/A;   TOTAL KNEE ARTHROPLASTY     left   VENTRICULAR ASSIST DEVICE INSERTION N/A 04/16/2019   Procedure: VENTRICULAR ASSIST DEVICE INSERTION;  Surgeon: Burnell Blanks, MD;  Location: [place] [facility];  Service: Cardiovascular;  Laterality: N/A;   Family History  Problem Relation Age of Onset   Hypertension Other    Diabetes Sister    Hyperlipidemia Sister    Sleep apnea Sister    Social History:  reports that he quit smoking about 38 years ago. He has never used smokeless tobacco. He reports current alcohol use of about 1.0 standard drinks of alcohol per week. He reports that he does not use drugs. Allergies:  Allergies  Allergen Reactions   Codeine Phosphate Other (See Comments)    Hyperactive    Medications Prior to Admission  Medication Sig Dispense Refill   acetaminophen (TYLENOL) 500 MG tablet Take 1,000 mg by mouth 2 (two) times a day.      amLODipine (NORVASC) 10 MG tablet Take 1 tablet (10 mg total) by mouth daily. (Patient taking differently: Take 5 mg by mouth at bedtime. ) 90 tablet 3   febuxostat (ULORIC) 40 MG tablet Take 40 mg by mouth every Monday.     ferric citrate (AURYXIA) 1 GM 210 MG(Fe) tablet Take 210 mg by mouth 3 (three) times daily with meals.     fluticasone (FLONASE) 50 MCG/ACT nasal spray Place 2 sprays into both nostrils daily. (Patient taking differently: Place 2 sprays into both nostrils daily as needed for allergies or rhinitis. ) 16 g 6    furosemide (LASIX) 80 MG tablet Take 2 tablets (160 mg total) by mouth 2 (two) times daily. (Patient taking differently: Take 80 mg by mouth See admin instructions. Take one tablet (80 mg) by mouth on Saturday or Sunday if needed for leg swelling (not on weekdays)) 120 tablet 0   hydrocortisone cream 1 % Apply 1 application topically at bedtime.     hydrOXYzine (ATARAX/VISTARIL) 25 MG tablet TAKE 1 TABLET (25 MG TOTAL) BY MOUTH 3 (THREE) TIMES DAILY AS NEEDED FOR ITCHING (INSOMNIA). (Patient taking differently: Take 25 mg by mouth at bedtime. For itching/insomnia) 30 tablet 0   Multiple Vitamin (MULTIVITAMIN WITH MINERALS) TABS tablet Take 1 tablet by mouth daily. One a Day Men's     rosuvastatin (CRESTOR) 5 MG tablet Take 1 tablet (5 mg total) by mouth daily. (Patient taking differently: Take 5 mg by mouth every 3 (three) days. ) 90 tablet 2   terazosin (HYTRIN) 1 MG capsule Take 2 capsules (2 mg total) by mouth at bedtime. 180 capsule 1   traZODone (DESYREL) 50 MG tablet TAKE 1/2 TO 1 TABLET AT BEDTIME AS NEEDED FOR SLEEP (Patient taking differently: Take 50 mg by mouth at bedtime. ) 90 tablet 0   sodium  bicarbonate 650 MG tablet Take 1 tablet (650 mg total) by mouth 2 (two) times daily. (Patient not taking: Reported on 04/17/2019) 180 tablet 4    Drug Regimen Review Drug regimen was reviewed and remains appropriate with no significant issues identified  Home: Home Living Family/patient expects to be discharged to:: Private residence Living Arrangements: Alone Available Help at Discharge: Family, Available 24 hours/day(plans to dc to sisters home ) Type of Home: House Home Access: Stairs to enter Technical brewer of Steps: 3 Entrance Stairs-Rails: Right, Left, Can reach both Home Layout: One level Bathroom Shower/Tub: Multimedia programmer: Standard Home Equipment: None   Functional History: Prior Function Level of Independence: Independent Comments: driving ,  semi retired  Functional Status:  Mobility: Bed Mobility Overal bed mobility: Needs Assistance Bed Mobility: Rolling, Sidelying to Sit Rolling: Supervision Sidelying to sit: Supervision, HOB elevated General bed mobility comments: Cues to decrease WB thru UEs when coming to EOB Transfers Overall transfer level: Needs assistance Equipment used: Rolling walker (2 wheeled) Transfers: Sit to/from Stand Sit to Stand: Min assist Stand pivot transfers: Min assist, +2 safety/equipment General transfer comment: Reliant on momentum and minA to power into standing to RW; able to perform 3x sit<>stand during session, requiring increased assist with fatigue Ambulation/Gait Ambulation/Gait assistance: Min assist Gait Distance (Feet): 32 Feet(+32) Assistive device: Rolling walker (2 wheeled) Gait Pattern/deviations: Step-through pattern, Decreased stride length, Trunk flexed, Wide base of support General Gait Details: Slow, unsteady gait requiring increased assist to maintain balance with fatigue and BLE buckling noted. 1x prolonged seated rest break to recover between gait trials. SpO2 >90% on RA; DOE up to 3/4. Difficulty gripping RW with L hand due to swelling Gait velocity: Decreased Gait velocity interpretation: <1.31 ft/sec, indicative of household ambulator    ADL: ADL Overall ADL's : Needs assistance/impaired Grooming: Set up, Sitting Upper Body Bathing: Set up, Sitting Lower Body Bathing: Minimal assistance, Sit to/from stand, +2 for safety/equipment Upper Body Dressing : Minimal assistance, Sitting Lower Body Dressing: Moderate assistance, Sit to/from stand Toilet Transfer: Ambulation, RW, Moderate assistance, +2 for physical assistance, +2 for safety/equipment Toilet Transfer Details (indicate cue type and reason): simulated to recliner  Toileting- Clothing Manipulation and Hygiene: Minimal assistance, Sit to/from stand Functional mobility during ADLs: Rolling walker, Minimal  assistance, +2 for safety/equipment General ADL Comments: pt limited by decreased activity tolerance, sternal precautions, generalized weakness, and back pain   Cognition: Cognition Overall Cognitive Status: Within Functional Limits for tasks assessed Orientation Level: (P) Oriented X4 Cognition Arousal/Alertness: Awake/alert Behavior During Therapy: WFL for tasks assessed/performed Overall Cognitive Status: Within Functional Limits for tasks assessed  Physical Exam: Blood pressure 116/76, pulse 87, temperature 98.4 F (36.9 C), temperature source Oral, resp. rate 18, height 6' (1.829 m), weight 124.7 kg, SpO2 100 %. Physical Exam  Vitals reviewed. Constitutional: He appears well-developed and well-nourished.  HENT:  Head: Normocephalic and atraumatic.  Eyes: EOM are normal. Right eye exhibits no discharge. Left eye exhibits no discharge.  Neck: No tracheal deviation present. No thyromegaly present.  Respiratory: Effort normal. No respiratory distress.  GI: He exhibits no distension.  Musculoskeletal:     Comments: Edema and bilateral lower extremities and left upper extremity.  Neurological: He is alert.  Follows full commands.   Cooperative with exam. Motor: Right upper extremity: 5/5 proximal distal Left upper extremity: Pulmonary 7/5 proximal distal Bilateral lower extremities: 4--4/5 proximal distal Sensation diminished to light touch left hand  Skin:  Midline chest incision C/D/I  Psychiatric: He  has a normal mood and affect. His behavior is normal.    Results for orders placed or performed during the hospital encounter of 04/16/19 (from the past 48 hour(s))  .Cooxemetry Panel (carboxy, met, total hgb, O2 sat)     Status: Abnormal   Collection Time: 04/29/19  3:20 AM  Result Value Ref Range   Total hemoglobin 8.0 (L) 12.0 - 16.0 g/dL   O2 Saturation 60.8 %   Carboxyhemoglobin 2.1 (H) 0.5 - 1.5 %   Methemoglobin 0.8 0.0 - 1.5 %  CBC     Status: Abnormal    Collection Time: 04/29/19  4:40 PM  Result Value Ref Range   WBC 12.6 (H) 4.0 - 10.5 K/uL   RBC 2.53 (L) 4.22 - 5.81 MIL/uL   Hemoglobin 7.4 (L) 13.0 - 17.0 g/dL   HCT 23.4 (L) 39.0 - 52.0 %   MCV 92.5 80.0 - 100.0 fL   MCH 29.2 26.0 - 34.0 pg   MCHC 31.6 30.0 - 36.0 g/dL   RDW 16.5 (H) 11.5 - 15.5 %   Platelets 300 150 - 400 K/uL   nRBC 0.0 0.0 - 0.2 %    Comment: Performed at [facility], [address], [place], [place] [address]  Renal function panel     Status: Abnormal   Collection Time: 04/29/19  7:33 PM  Result Value Ref Range   Sodium 137 135 - 145 mmol/L   Potassium 4.2 3.5 - 5.1 mmol/L   Chloride 101 98 - 111 mmol/L   CO2 23 22 - 32 mmol/L   Glucose, Bld 108 (H) 70 - 99 mg/dL   BUN 64 (H) 8 - 23 mg/dL   Creatinine, Ser 8.31 (H) 0.61 - 1.24 mg/dL   Calcium 11.3 (H) 8.9 - 10.3 mg/dL   Phosphorus 7.0 (H) 2.5 - 4.6 mg/dL   Albumin 2.6 (L) 3.5 - 5.0 g/dL   GFR calc non Af Amer 6 (L) >60 mL/min   GFR calc Af Amer 7 (L) >60 mL/min   Anion gap 13 5 - 15    Comment: Performed at [facility] [address], [place], [place] [address]  CBC with Differential/Platelet     Status: Abnormal   Collection Time: 04/30/19  4:50 AM  Result Value Ref Range   WBC 12.3 (H) 4.0 - 10.5 K/uL   RBC 2.58 (L) 4.22 - 5.81 MIL/uL   Hemoglobin 7.3 (L) 13.0 - 17.0 g/dL   HCT 24.2 (L) 39.0 - 52.0 %   MCV 93.8 80.0 - 100.0 fL   MCH 28.3 26.0 - 34.0 pg   MCHC 30.2 30.0 - 36.0 g/dL   RDW 16.8 (H) 11.5 - 15.5 %   Platelets 297 150 - 400 K/uL   nRBC 0.0 0.0 - 0.2 %   Neutrophils Relative % 69 %   Neutro Abs 8.6 (H) 1.7 - 7.7 K/uL   Lymphocytes Relative 17 %   Lymphs Abs 2.1 0.7 - 4.0 K/uL   Monocytes Relative 11 %   Monocytes Absolute 1.3 (H) 0.1 - 1.0 K/uL   Eosinophils Relative 2 %   Eosinophils Absolute 0.2 0.0 - 0.5 K/uL   Basophils Relative 0 %   Basophils Absolute 0.1 0.0 - 0.1 K/uL   Immature Granulocytes 1 %   Abs Immature Granulocytes 0.08 (H) 0.00 - 0.07 K/uL     Comment: Performed at [facility] [address] [place], [place] [address]  Basic metabolic panel  Status: Abnormal   Collection Time: 04/30/19  4:50 AM  Result Value Ref Range   Sodium 138 135 - 145 mmol/L   Potassium 4.2 3.5 - 5.1 mmol/L   Chloride 100 98 - 111 mmol/L   CO2 26 22 - 32 mmol/L   Glucose, Bld 84 70 - 99 mg/dL   BUN 27 (H) 8 - 23 mg/dL   Creatinine, Ser 4.85 (H) 0.61 - 1.24 mg/dL    Comment: DELTA CHECK NOTED   Calcium 9.9 8.9 - 10.3 mg/dL   GFR calc non Af Amer 11 (L) >60 mL/min   GFR calc Af Amer 13 (L) >60 mL/min   Anion gap 12 5 - 15    Comment: Performed at [facility] [address], [place], [place] [address]  .Cooxemetry Panel (carboxy, met, total hgb, O2 sat)     Status: Abnormal   Collection Time: 04/30/19  5:04 AM  Result Value Ref Range   Total hemoglobin 4.3 (LL) 12.0 - 16.0 g/dL    Comment: CORRECTED ON 10/08 AT 0530: PREVIOUSLY REPORTED AS 4.3 CRITICAL RESULT CALLED TO, READ BACK BY AND VERIFIED WITH: MICHELLE SHELTON, RRT CALLED ERIN HUENINK, RN @0527  ON 04/30/2019   O2 Saturation 66.1 %   Carboxyhemoglobin 2.7 (H) 0.5 - 1.5 %   Methemoglobin 1.3 0.0 - 1.5 %  .Cooxemetry Panel (carboxy, met, total hgb, O2 sat)     Status: Abnormal   Collection Time: 04/30/19  5:43 AM  Result Value Ref Range   Total hemoglobin 9.6 (L) 12.0 - 16.0 g/dL   O2 Saturation 58.0 %   Carboxyhemoglobin 2.4 (H) 0.5 - 1.5 %   Methemoglobin 0.5 0.0 - 1.5 %   Dg Chest Port 1 View  Result Date: 04/29/2019 CLINICAL DATA:  Status post dialysis catheter insertion. EXAM: PORTABLE CHEST 1 VIEW COMPARISON:  04/28/2019 FINDINGS: 1342 hours. Low volume film. The cardio pericardial silhouette is enlarged. There is pulmonary vascular congestion without overt pulmonary edema. No pneumothorax or pleural effusion. Left subclavian central line tip overlies the left innominate vein near the innominate vein confluence. Right IJ central line tip overlies the upper right atrium.  The visualized bony structures of the thorax are intact. Telemetry leads overlie the chest. IMPRESSION: 1. Low volume film with pulmonary vascular congestion. 2. Right IJ central line tip overlies the upper right atrium. Stable left subclavian line. Electronically Signed   By: Misty Stanley M.D.   On: 04/29/2019 15:34   Dg Fluoro Guide Cv Line-no Report  Result Date: 04/29/2019 Fluoroscopy was utilized by the requesting physician.  No radiographic interpretation.       Medical Problem List and Plan: 1.  Debility secondary to non-STEMI status post CABG x3 04/16/2019.  Sternal precautions   Admit to CIR 2.  Antithrombotics: -DVT/anticoagulation: Eliquis  -antiplatelet therapy: Aspirin 81 mg daily 3. Pain Management: Oxycodone/Ultram as needed 4. Mood: Provide emotional support  -antipsychotic agents: N/A 5. Neuropsych: This patient is capable of making decisions on his own behalf. 6. Skin/Wound Care: Routine skin checks 7. Fluids/Electrolytes/Nutrition: Routine in and outs.  CMP ordered for tomorrow a.m. 8.  Acute on chronic anemia.  CBC ordered for tomorrow a.m.  Continue Aranesp 9.  Atrial fibrillation with RVR.  Amiodarone 200 mg twice daily.  Cardiac rate controlled  Monitor with increased physical exertion. 10.  Hypotension.  ProAmatine 5 mg Monday Wednesday Friday  Monitor for symptoms 11.  ESRD on hemodialysis.  Status post tunneled catheter 04/29/2019 12.  Hyperlipidemia.  Crestor  13.  Constipation with hemorrhoids.  Laxative assistance.  Continue Tucks pads  Cathlyn Parsons, PA-C 04/30/2019  I have personally performed a face to face diagnostic evaluation, including, but not limited to relevant history and physical exam findings, of this patient and developed relevant assessment and plan.  Additionally, I have reviewed and concur with the physician assistant's documentation above.  Delice Lesch, MD, ABPMR

## 2019-04-30 NOTE — Progress Notes (Addendum)
CRITICAL VALUE ALERT  Critical Value:  Total Hemoglobin: 4.3   Date & Time Notified:  10/8 @ 0525    Provider Notified: Kipp Brood MD @ [phone]  Orders Received/Actions taken: Repeat co-ox and monitor patient per MD orders   0609: Received updated co-ox results. Total hemoglobin: 9.6

## 2019-04-30 NOTE — Progress Notes (Signed)
Inpatient Rehabilitation-Admissions Coordinator   I have insurance approval and medical approval from attending service for admit to CIR today. Pt wants to pursue CIR at this time. Insurance letter reviewed and consent forms signed. RN, CM/SW updated on plan for admit today.   Please call if questions.   Jhonnie Garner, OTR/L  Rehab Admissions Coordinator  [phone] 04/30/2019 4:03 PM

## 2019-04-30 NOTE — Progress Notes (Signed)
Patient admitted to IP rehab during shift denies pain at time, son at the bedside. Patient questions answers regarding therapy and therapy schedule.  Adria Devon, LPN.

## 2019-04-30 NOTE — Care Management Important Message (Signed)
Important Message  Patient Details  Name: DILEN GIANGRASSO MRN: [id number] Date of Birth: June 15, 1951   Medicare Important Message Given:  Yes     Shelda Altes 04/30/2019, 4:23 PM

## 2019-04-30 NOTE — Progress Notes (Addendum)
      [address]       [place] [address]             [phone]      1 Day Post-Op Procedure(s) (LRB): INSERTION OF TUNNELED DIALYSIS CATHETER, right internal jugular (N/A) Subjective: States he is about to burst this morning and has not been able to urinate.   Objective: Vital signs in last 24 hours: Temp:  [97 F (36.1 C)-98.8 F (37.1 C)] 98.8 F (37.1 C) (10/08 0421) Pulse Rate:  [71-84] 82 (10/08 0556) Cardiac Rhythm: Heart block (10/08 0700) Resp:  [11-23] 16 (10/08 0556) BP: (76-106)/(36-61) 91/53 (10/08 0556) SpO2:  [92 %-100 %] 97 % (10/08 0055) Weight:  [124.5 kg-125 kg] 124.7 kg (10/08 0421)  Hemodynamic parameters for last 24 hours: CVP:  [4 mmHg-8 mmHg] 8 mmHg  Intake/Output from previous day: 10/07 0701 - 10/08 0700 In: 940 [P.O.:240; I.V.:400; IV Piggyback:300] Out: 443 [Urine:1; Blood:10] Intake/Output this shift: No intake/output data recorded.  General appearance: alert, cooperative and no distress Heart: regular rate and rhythm, S1, S2 normal, no murmur, click, rub or gallop Lungs: clear to auscultation bilaterally Abdomen: soft, non-tender; bowel sounds normal; no masses,  no organomegaly Extremities: 2-3+ pitting pedal edema Wound: clean and dry  Lab Results: Recent Labs    04/29/19 1640 04/30/19 0450  WBC 12.6* 12.3*  HGB 7.4* 7.3*  HCT 23.4* 24.2*  PLT 300 297   BMET:  Recent Labs    04/29/19 1933 04/30/19 0450  NA 137 138  K 4.2 4.2  CL 101 100  CO2 23 26  GLUCOSE 108* 84  BUN 64* 27*  CREATININE 8.31* 4.85*  CALCIUM 11.3* 9.9    PT/INR: No results for input(s): LABPROT, INR in the last 72 hours. ABG    Component Value Date/Time   PHART 7.376 04/20/2019 0515   HCO3 27.4 04/20/2019 0515   TCO2 29 04/20/2019 0515   ACIDBASEDEF 4.0 (H) 04/18/2019 0559   O2SAT 58.0 04/30/2019 0543   CBG (last 3)  No results for input(s): GLUCAP in the last 72 hours.  Assessment/Plan: S/P Procedure(s) (LRB):  INSERTION OF TUNNELED DIALYSIS CATHETER, right internal jugular (N/A)  1 conts to progress overall, having external hemorrhoidal bleeding-general surgery consult and no surgery needed 2 hemodynamics- variable BP- Co-Ox is 58- AHF conts to assist with management 3 dialysis and renal management as per nephrology, Vasc to placed [facility] yesterday 4 H and H 7.3/24.2-stable from yesterdays study. Will monitor closely.  5 poss CIR soon, continue PT/OT 6. Urinary retention? Will bladder scan and in-and-out cath if retaining.    LOS: 14 days    Elgie Collard 04/30/2019   Agree with above.  Clinically stable Dispo planning

## 2019-04-30 NOTE — Progress Notes (Signed)
Alex Arn, MD  Physician  Physical Medicine and Rehabilitation  PMR Pre-admission  Signed  Date of Service:  04/28/2019 11:45 AM      Related encounter: ED to Hosp-Admission (Current) from 04/16/2019 in [place]        PMR Admission Coordinator Pre-Admission Assessment   Patient: Alex Williams is an 68 y.o., male MRN: [id number] DOB: September 27, 1950 Height: 6' (182.9 cm) Weight: 124.7 kg   Insurance Information HMO: yes    PPO:      PCP:      IPA:      80/20:      OTHER: PRIMARY: U[employer] Medicare      Policy#: [id number]      Subscriber: Patient CM Name: Alex Williams      Phone#: [phone]     Fax#: [phone] Pre-Cert#: [id number]      Employer:  [employer]auf[employer] provided by Alex Williams on 10/8 for admit to CIR. Pt is approved for 7 days starting on 10/8. Next review date is 10/14. Clinical updates are due to (f): [phone] (no CM currently assigned at time of approval). When faxing updates, put "for continued stay review" on coversheet. Benefits:  Phone #: online     Name: [email or web address] Eff. Date: 10/22/2018 - 07/23/2019     Deduct: $0      Out of Pocket Max: $3,600 [phone] met)     Life Max: NA CIR: $295/day co-pay for days 1-5, $0/day co-pay for days 6+      SNF: $0/day co-pay for days 1-20, $160/day co-pay for days 21-43, $0/day co-pay for days 44-100; limited to 100 days/cal yr Outpatient: limited by medical necessity     Co-Pay: $30/visit co-pay Home Health: 100% coverage; limited by medical necessity      Co-Pay: 0% co-insurance DME: 80% coverage     Co-Pay: 20% co-insurance Providers:  SECONDARY:       Policy#:       Subscriber:  CM Name:       Phone#:      Fax#:  Pre-Cert#:       Employer:  Benefits:  Phone #:      Name:  Eff. Date:      Deduct:       Out of Pocket Max:       Life Max:  CIR:       SNF:  Outpatient:      Co-Pay:  Home Health:       Co-Pay:  DME:      Co-Pay:    Medicaid Application Date:       Case Manager:   Disability Application Date:       Case Worker:    The Data Collection Information Summary for patients in Inpatient Rehabilitation Facilities with attached Privacy Act [place] Records was provided and verbally reviewed with: Patient   Emergency Contact Information         Contact Information     Name Relation Home Work Mobile    Alex Williams Sister [phone]        Alex Williams     [phone]    Alex Williams Daughter     [phone]         Current Medical History  Patient Admitting Diagnosis: Debility after CABG x3, complicated with Dialysis    History of Present Illness: Alex Williams is a 68 year old male with history of chronic kidney disease with hemodialysis, systolic congestive heart failure, hyperlipidemia, hypertension,  low back pain.  Per chart review patient lives alone independent prior to admission.  1 level home 3 steps to entry.  Plans to stay with his sister and assistance as needed on discharge.  Presented 04/16/2019 with chest pain and palpitations.  Echocardiogram with with ejection fraction of 25%.  Suspect cardiogenic shock in setting of atypical atrial flutter with RVR and ischemia.  Cardiac catheterization 90% distal left main stenosis into the LCx, totally occluded mid LAD.  Initial attempts at angioplasty but unable to restore flow.  Patient underwent emergent medial sternotomy for coronary artery bypass grafting x3 04/16/2019 per Dr. Kipp Brood.  Hospital course pain management.  Hemodialysis ongoing after initially receiving CRRT and patient did receive a tunneled dialysis catheter 04/29/2019 per Dr. Donnetta Hutching.  Acute on chronic anemia 7.4 patient did receive 1 unit packed red blood cells 04/27/2019.  Maintained on Eliquis as well as low-dose aspirin.  Cardiac rate remained controlled patient continues on amiodarone follow-up per cardiology services.  Close monitoring of blood pressure continues on ProAmatine Monday Wednesday Fridays.  Tolerating a  regular consistency diet.  Therapy evaluations completed and patient is to be admitted for a comprehensive rehab program. Patient's medical record from [facility] has been reviewed by the rehabilitation admission coordinator and physician.   Past Medical History      Past Medical History:  Diagnosis Date   Cervical disc disease     Chronic kidney disease     COLONIC POLYPS, HX OF 06/27/2007   EXOGENOUS OBESITY 01/30/2010   GERD (gastroesophageal reflux disease)      PMH   GOUT 01/30/2010   HYPERCHOLESTEROLEMIA 06/30/2007   HYPERTENSION 06/27/2007   LOW BACK PAIN 06/27/2007   NEPHROLITHIASIS, HX OF 06/27/2007   OSTEOARTHRITIS 06/27/2007   SLEEP APNEA, OBSTRUCTIVE, MODERATE 01/27/2009   Wears dentures      upper      Family History   family history includes Diabetes in his sister; Hyperlipidemia in his sister; Hypertension in an other family member; Sleep apnea in his sister.   Prior Rehab/Hospitalizations Has the patient had prior rehab or hospitalizations prior to admission? Yes   Has the patient had major surgery during 100 days prior to admission? Yes              Current Medications   Current Facility-Administered Medications:    0.9 %  sodium chloride infusion (Manually program via Guardrails IV Fluids), , Intravenous, Once, Larey Dresser, MD   Place/Maintain arterial line, , , Until Discontinued **AND** 0.9 %  sodium chloride infusion, , Intra-arterial, PRN, Larey Dresser, MD, Last Rate: 10 mL/hr at 04/21/19 2209   0.9 %  sodium chloride infusion, , Intravenous, PRN, Larey Dresser, MD, Last Rate: 10 mL/hr at 04/29/19 1156, 500 mL at 04/29/19 1156   allopurinol (ZYLOPRIM) tablet 100 mg, 100 mg, Oral, Daily, Larey Dresser, MD, 100 mg at 04/30/19 1001   amiodarone (PACERONE) tablet 200 mg, 200 mg, Oral, BID, Larey Dresser, MD   apixaban Arne Cleveland) tablet 5 mg, 5 mg, Oral, BID, Larey Dresser, MD, 5 mg at 04/30/19 1001   aspirin EC  tablet 81 mg, 81 mg, Oral, Daily, Larey Dresser, MD, 81 mg at 04/30/19 1001   B-complex with vitamin C tablet 1 tablet, 1 tablet, Oral, Daily, Larey Dresser, MD, 1 tablet at 04/30/19 1000   bisacodyl (DULCOLAX) EC tablet 10 mg, 10 mg, Oral, Daily, 10 mg at 04/27/19 0924 **OR** bisacodyl (DULCOLAX) suppository 10  mg, 10 mg, Rectal, Daily, Larey Dresser, MD   bisoprolol (ZEBETA) tablet 2.5 mg, 2.5 mg, Oral, Once per day on Sun Tue Thu Sat, McLean, Dalton S, MD, 2.5 mg at 04/30/19 1119   Chlorhexidine Gluconate Cloth 2 % PADS 6 each, 6 each, Topical, Daily, Larey Dresser, MD, 6 each at 04/30/19 1122   Chlorhexidine Gluconate Cloth 2 % PADS 6 each, 6 each, Topical, Q0600, Roney Jaffe, MD, 6 each at 04/30/19 0524   cinacalcet (SENSIPAR) tablet 30 mg, 30 mg, Oral, Q supper, Madelon Lips, MD, 30 mg at 04/29/19 1656   [START ON 05/04/2019] Darbepoetin Alfa (ARANESP) injection 60 mcg, 60 mcg, Intravenous, Q Mon-HD, Roney Jaffe, MD   ferric gluconate (NULECIT) 125 mg in sodium chloride 0.9 % 100 mL IVPB, 125 mg, Intravenous, Q M,W,F-HD, Madelon Lips, MD, Last Rate: 110 mL/hr at 04/29/19 1819, 125 mg at 04/29/19 1819   fluticasone (FLONASE) 50 MCG/ACT nasal spray 2 spray, 2 spray, Each Nare, Daily, Larey Dresser, MD, 2 spray at 04/30/19 1009   heparin injection 1,000 Units, 1,000 Units, Intravenous, Q dialysis, Claudia Desanctis, MD, 3,400 Units at 04/29/19 2237   hydrocortisone (ANUSOL-HC) 2.5 % rectal cream, , Rectal, PRN, Lightfoot, Lucile Crater, MD   influenza vaccine adjuvanted (FLUAD) injection 0.5 mL, 0.5 mL, Intramuscular, Tomorrow-1000, Lightfoot, Harrell O, MD   lactated ringers infusion 500 mL, 500 mL, Intravenous, Once PRN, Larey Dresser, MD   lactated ringers infusion, , Intravenous, Continuous, Larey Dresser, MD   lactated ringers infusion, , Intravenous, Continuous, Larey Dresser, MD, Stopped at 04/22/19 0900   MEDLINE mouth rinse, 15 mL, Mouth  Rinse, BID, Larey Dresser, MD, 15 mL at 04/30/19 1006   menthol-cetylpyridinium (CEPACOL) lozenge 3 mg, 1 lozenge, Oral, PRN, Larey Dresser, MD   metoprolol tartrate (LOPRESSOR) injection 2.5-5 mg, 2.5-5 mg, Intravenous, Q2H PRN, Larey Dresser, MD   midodrine (PROAMATINE) tablet 5 mg, 5 mg, Oral, Q M,W,F, Larey Dresser, MD, 5 mg at 04/29/19 1946   morphine 2 MG/ML injection 1-4 mg, 1-4 mg, Intravenous, Q1H PRN, Larey Dresser, MD, 2 mg at 04/25/19 0855   ondansetron [facility]) injection 4 mg, 4 mg, Intravenous, Q6H PRN, Larey Dresser, MD, 4 mg at 04/18/19 1230   oxyCODONE (Oxy IR/ROXICODONE) immediate release tablet 5 mg, 5 mg, Oral, Q6H PRN, Larey Dresser, MD, 5 mg at 04/30/19 0522   pantoprazole (PROTONIX) EC tablet 40 mg, 40 mg, Oral, Daily, Larey Dresser, MD, 40 mg at 04/30/19 1001   pneumococcal 23 valent vaccine (PNU-IMMUNE) injection 0.5 mL, 0.5 mL, Intramuscular, Tomorrow-1000, Lightfoot, Harrell O, MD   psyllium (HYDROCIL/METAMUCIL) packet 1 packet, 1 packet, Oral, BID, Focht, Jessica L, PA, 1 packet at 04/30/19 1000   rosuvastatin (CRESTOR) tablet 10 mg, 10 mg, Oral, q1800, Larey Dresser, MD, 10 mg at 04/29/19 1700   sevelamer carbonate (RENVELA) tablet 800 mg, 800 mg, Oral, TID WC, Madelon Lips, MD, 800 mg at 04/30/19 1222   sodium chloride flush (NS) 0.9 % injection 10-40 mL, 10-40 mL, Intracatheter, Q12H, Larey Dresser, MD, 10 mL at 04/30/19 1007   sodium chloride flush (NS) 0.9 % injection 10-40 mL, 10-40 mL, Intracatheter, PRN, Larey Dresser, MD   sodium chloride flush (NS) 0.9 % injection 3 mL, 3 mL, Intravenous, Q12H, Larey Dresser, MD, 3 mL at 04/30/19 1224   sodium chloride flush (NS) 0.9 % injection 3 mL, 3 mL, Intravenous, PRN, Larey Dresser,  MD   traMADol (ULTRAM) tablet 50 mg, 50 mg, Oral, Q12H PRN, Larey Dresser, MD, 50 mg at 04/30/19 0008   witch hazel-glycerin (TUCKS) pad, , Topical, PRN, Lajuana Matte, MD    Patients Current Diet:     Diet Order                      Diet Heart Room service appropriate? Yes; Fluid consistency: Thin  Diet effective now                   Precautions / Restrictions Precautions Precautions: Fall, Sternal Precaution Booklet Issued: Yes (comment) Precaution Comments: reviewed precautions with patient  Restrictions Weight Bearing Restrictions: Yes RUE Weight Bearing: Non weight bearing RUE Partial Weight Bearing Percentage or Pounds: sternal LUE Weight Bearing: Non weight bearing LUE Partial Weight Bearing Percentage or Pounds: Sternal Precautions LLE Weight Bearing: Non weight bearing    Has the patient had 2 or more falls or a fall with injury in the past year? No   Prior Activity Level Community (5-7x/wk): very active, still works part-time as Dealer; drove PTA, no AD use for ambulation. Independent PTA   Prior Functional Level Self Care: Did the patient need help bathing, dressing, using the toilet or eating? Independent   Indoor Mobility: Did the patient need assistance with walking from room to room (with or without device)? Independent   Stairs: Did the patient need assistance with internal or external stairs (with or without device)? Independent   Functional Cognition: Did the patient need help planning regular tasks such as shopping or remembering to take medications? Independent   Home Assistive Devices / Equipment Home Assistive Devices/Equipment: None Home Equipment: None   Prior Device Use: Indicate devices/aids used by the patient prior to current illness, exacerbation or injury? None of the above   Current Functional Level Cognition   Overall Cognitive Status: Within Functional Limits for tasks assessed Orientation Level: (P) Oriented X4    Extremity Assessment (includes Sensation/Coordination)   Upper Extremity Assessment: Generalized weakness  Lower Extremity Assessment: Defer to PT evaluation     ADLs   Overall ADL's  : Needs assistance/impaired Grooming: Set up, Sitting Upper Body Bathing: Set up, Sitting Lower Body Bathing: Minimal assistance, Sit to/from stand, +2 for safety/equipment Upper Body Dressing : Minimal assistance, Sitting Lower Body Dressing: Moderate assistance, Sit to/from stand Toilet Transfer: Ambulation, RW, Moderate assistance, +2 for physical assistance, +2 for safety/equipment Toilet Transfer Details (indicate cue type and reason): simulated to recliner  Toileting- Clothing Manipulation and Hygiene: Minimal assistance, Sit to/from stand Functional mobility during ADLs: Rolling walker, Minimal assistance, +2 for safety/equipment General ADL Comments: pt limited by decreased activity tolerance, sternal precautions, generalized weakness, and back pain      Mobility   Overal bed mobility: Needs Assistance Bed Mobility: Rolling, Sidelying to Sit Rolling: Supervision Sidelying to sit: Supervision, HOB elevated General bed mobility comments: Cues to decrease WB thru UEs when coming to EOB     Transfers   Overall transfer level: Needs assistance Equipment used: Rolling walker (2 wheeled) Transfers: Sit to/from Stand Sit to Stand: Min assist Stand pivot transfers: Min assist, +2 safety/equipment General transfer comment: Reliant on momentum and minA to power into standing to RW; able to perform 3x sit<>stand during session, requiring increased assist with fatigue     Ambulation / Gait / Stairs / Wheelchair Mobility   Ambulation/Gait Ambulation/Gait assistance: Min Web designer (Feet): 32 Feet(+32) Assistive device: Rolling  walker (2 wheeled) Gait Pattern/deviations: Step-through pattern, Decreased stride length, Trunk flexed, Wide base of support General Gait Details: Slow, unsteady gait requiring increased assist to maintain balance with fatigue and BLE buckling noted. 1x prolonged seated rest break to recover between gait trials. SpO2 >90% on RA; DOE up to 3/4. Difficulty  gripping RW with L hand due to swelling Gait velocity: Decreased Gait velocity interpretation: <1.31 ft/sec, indicative of household ambulator     Posture / Balance Dynamic Sitting Balance Sitting balance - Comments: Sitting at sink to perform ADLs- declined standing due to back pain and fatigue. Balance Overall balance assessment: Needs assistance Sitting-balance support: No upper extremity supported, Feet supported Sitting balance-Leahy Scale: Fair Sitting balance - Comments: Sitting at sink to perform ADLs- declined standing due to back pain and fatigue. Standing balance support: During functional activity, Bilateral upper extremity supported Standing balance-Leahy Scale: Poor Standing balance comment: Heavy reliance on BUE support     Special needs/care consideration BiPAP/CPAP : uses CPAP at night at home CPM : no Continuous Drip IV: lactated ringers infusion Dialysis : yes        Days : MWF Life Vest : no Oxygen : on RA Special Bed : no Trach Size : no Wound Vac (area) : no      Location : no Skin: surgical incision (x2 to chest, right leg- upper medial thigh); ecchymosis to lower mid abdomen and right arm                            Bowel mgmt: last BM: 04/29/2019; continent Bladder mgmt: continent Diabetic mgmt: no Behavioral consideration : no Chemo/radiation : no    Previous Home Environment (from acute therapy documentation) Living Arrangements: Alone Available Help at Discharge: Family, Available 24 hours/day(plans to dc to sisters home ) Type of Home: House Home Layout: One level Home Access: Stairs to enter Entrance Stairs-Rails: Right, Left, Can reach both Entrance Stairs-Number of Steps: 3 Bathroom Shower/Tub: Multimedia programmer: Standard Home Care Services: No   Discharge Living Setting Plans for Discharge Living Setting: Other (Comment)(plan is to stay with sister at her house at [place] for support) Type of Home at Discharge: House Discharge Home  Layout: One level Discharge Home Access: Level entry Discharge Bathroom Shower/Tub: Tub/shower unit, Walk-in shower Discharge Bathroom Toilet: Standard Discharge Bathroom Accessibility: Yes How Accessible: Accessible via walker Does the patient have any problems obtaining your medications?: No   Social/Family/Support Systems Patient Roles: Other (Comment)(part-time Dealer. ) Contact Information: sister [name]): home [phone]  Anticipated Caregiver: sister and brother-in-law Anticipated Caregiver's Contact Information: see above Ability/Limitations of Caregiver: Min A Caregiver Availability: 24/7 Discharge Plan Discussed with Primary Caregiver: Yes Is Caregiver In Agreement with Plan?: Yes Does Caregiver/Family have Issues with Lodging/Transportation while Pt is in Rehab?: No    Designated visitor: Son: Dian Situ   *Please have SW call Terri Piedra (Dialysis Coordinator prior to DC for planning purposes).    Goals/Additional Needs Patient/Family Goal for Rehab: PT/OT: Supervision; SLP: NA Expected length of stay: 7-10 days Cultural Considerations: NA Dietary Needs: heart healthy, thin liquids Equipment Needs: TBD Pt/Family Agrees to Admission and willing to participate: Yes Program Orientation Provided & Reviewed with Pt/Caregiver Including Roles  & Responsibilities: Yes(pt and his sister Arbie Cookey)  Barriers to Discharge: Other (comments)(no barriers currently identified)   Decrease burden of Care through IP rehab admission: NA   Possible need for SNF placement upon discharge: Not anticipated; pt  has good family support from his sister and brother-in-law who are retired and are willing and able to physically assist at needed at [place]. Pt plans to stay with his sister who's house is more accessible. Pt has a good prognosis for further progress through CIR.    Patient Condition: I have reviewed medical records from [facility], spoken with RN, and patient and family  member (sister). I met with patient at the bedside for inpatient rehabilitation assessment.  Patient will benefit from ongoing PT and OT, can actively participate in 3 hours of therapy a day 5 days of the week, and can make measurable gains during the admission.  Patient will also benefit from the coordinated team approach during an Inpatient Acute Rehabilitation admission.  The patient will receive intensive therapy as well as Rehabilitation physician, nursing, social worker, and care management interventions.  Due to safety, skin/wound care, disease management, medication administration, pain management and patient education the patient requires 24 hour a day rehabilitation nursing.  The patient is currently Min/Mod A with mobility and Min to Mod Ax 2 for basic ADLs.  Discharge setting and therapy post discharge at home with home health is anticipated.  Patient has agreed to participate in the Acute Inpatient Rehabilitation Program and will admit 04/30/2019.   Preadmission Screen Completed By:  Raechel Ache, 04/30/2019 4:15 PM ______________________________________________________________________   Discussed status with Dr. Posey Pronto on 04/30/2019 at 4:15PM and received approval for admission today.   Admission Coordinator:  Raechel Ache, OT, time 4:15PM/Date 04/30/2019    Assessment/Plan: Diagnosis: Debility    1. Does the need for close, 24 hr/day Medical supervision in concert with the patient's rehab needs make it unreasonable for this patient to be served in a less intensive setting? Yes  2. Co-Morbidities requiring supervision/potential complications: chronic kidney disease with hemodialysis, systolic congestive heart failure, hyperlipidemia, hypertension, low back pain 3. Due to bladder management, bowel management, safety, skin/wound care, disease management, pain management and patient education, does the patient require 24 hr/day rehab nursing? Yes 4. Does the patient require coordinated care of  a physician, rehab nurse, PT (1-2 hrs/day, 5 days/week) and OT (1-2 hrs/day, 5 days/week) to address physical and functional deficits in the context of the above medical diagnosis(es)? Yes Addressing deficits in the following areas: balance, endurance, locomotion, strength, transferring, bathing, dressing, toileting and psychosocial support 5. Can the patient actively participate in an intensive therapy program of at least 3 hrs of therapy 5 days a week? Potentially 6. The potential for patient to make measurable gains while on inpatient rehab is excellent 7. Anticipated functional outcomes upon discharge from inpatients are: supervision and min assist PT, supervision and min assist OT, n/a SLP 8. Estimated rehab length of stay to reach the above functional goals is: 14-17 days. 9. Anticipated D/C setting: Home 10. Anticipated post D/C treatments: HH therapy and Home excercise program 11. Overall Rehab/Functional Prognosis: good   MD Signature: Delice Lesch, MD, ABPMR        Revision History Date/Time User Provider Type Action  04/30/2019  4:57 PM Alex Arn, MD Physician Sign  04/30/2019  4:17 PM Raechel Ache, [place] Rehab Admission Coordinator Share   View Details Report

## 2019-04-30 NOTE — Progress Notes (Signed)
Inpatient Rehabilitation-Admissions Coordinator   Pt's insurance company has requested peer to peer prior to making a determination for CIR. They state the peer to peer needs to be with the acute treating MD or PA. I have contacted Nicholes Rough, PA with the attending service and she has agreed to complete the peer to peer prior to the deadline today at 2pm EST. I will follow up once a determination has been made by the patient's insurance company regarding CIR.   Please call if questions.   Jhonnie Garner, OTR/L  Rehab Admissions Coordinator  [phone] 04/30/2019 11:13 AM

## 2019-04-30 NOTE — Progress Notes (Signed)
Patient ID: Alex Williams, male   DOB: 10-Jan-1951, 68 y.o.   MRN: [id number]     Advanced Heart Failure Rounding Note  PCP-Cardiologist: No primary care provider on file.   Subjective:    9/24 S/P CABG x3. Impella in place.  9/27 Impella removed.  10/2 CVVH stopped.   No complaints this morning.  Co-ox 58%.  Hgb mildly lower at 7.3. SBP wide range, 90s-110s.      Objective:   Weight Range: 124.7 kg Body mass index is 37.29 kg/m.   Vital Signs:   Temp:  [97 F (36.1 C)-98.8 F (37.1 C)] 98.4 F (36.9 C) (10/08 0756) Pulse Rate:  [71-87] 87 (10/08 0756) Resp:  [11-23] 18 (10/08 0756) BP: (76-116)/(36-76) 116/76 (10/08 0756) SpO2:  [92 %-100 %] 100 % (10/08 0756) Weight:  [124.5 kg-125 kg] 124.7 kg (10/08 0421) Last BM Date: 04/29/19  Weight change: Filed Weights   04/29/19 1920 04/29/19 2258 04/30/19 0421  Weight: 125 kg 124.5 kg 124.7 kg    Intake/Output:   Intake/Output Summary (Last 24 hours) at 04/30/2019 1044 Last data filed at 04/30/2019 0500 Gross per 24 hour  Intake 940 ml  Output 443 ml  Net 497 ml      Physical Exam   General: NAD Neck: No JVD, no thyromegaly or thyroid nodule.  Lungs: Clear to auscultation bilaterally with normal respiratory effort. CV: Nondisplaced PMI.  Heart regular S1/S2, no S3/S4, no murmur.  No peripheral edema.   Abdomen: Soft, nontender, no hepatosplenomegaly, no distention.  Skin: Intact without lesions or rashes.  Neurologic: Alert and oriented x 3.  Psych: Normal affect. Extremities: No clubbing or cyanosis.  HEENT: Normal.   EKG    NSR 70s (personally reviewed)  Labs    CBC Recent Labs    04/28/19 0354 04/29/19 1640 04/30/19 0450  WBC 17.9* 12.6* 12.3*  NEUTROABS 12.3*  --  8.6*  HGB 8.9* 7.4* 7.3*  HCT 29.3* 23.4* 24.2*  MCV 92.4 92.5 93.8  PLT 402* 300 [id number]   Basic Metabolic Panel Recent Labs    04/29/19 1933 04/30/19 0450  NA 137 138  K 4.2 4.2  CL 101 100  CO2 23 26  GLUCOSE 108* 84  BUN  64* 27*  CREATININE 8.31* 4.85*  CALCIUM 11.3* 9.9  PHOS 7.0*  --    Liver Function Tests Recent Labs    04/29/19 1933  ALBUMIN 2.6*   No results for input(s): LIPASE, AMYLASE in the last 72 hours. Cardiac Enzymes No results for input(s): CKTOTAL, CKMB, CKMBINDEX, TROPONINI in the last 72 hours.  BNP: BNP (last 3 results) Recent Labs    08/25/18 1746 01/30/19 0819 02/05/19 0951  BNP 513.0* 1,138.2* 1,186.2*    ProBNP (last 3 results) No results for input(s): PROBNP in the last 8760 hours.   D-Dimer No results for input(s): DDIMER in the last 72 hours. Hemoglobin A1C No results for input(s): HGBA1C in the last 72 hours. Fasting Lipid Panel No results for input(s): CHOL, HDL, LDLCALC, TRIG, CHOLHDL, LDLDIRECT in the last 72 hours. Thyroid Function Tests No results for input(s): TSH, T4TOTAL, T3FREE, THYROIDAB in the last 72 hours.  Invalid input(s): FREET3  Other results:   Imaging    Dg Chest Port 1 View  Result Date: 04/29/2019 CLINICAL DATA:  Status post dialysis catheter insertion. EXAM: PORTABLE CHEST 1 VIEW COMPARISON:  04/28/2019 FINDINGS: 1342 hours. Low volume film. The cardio pericardial silhouette is enlarged. There is pulmonary vascular congestion without overt pulmonary edema.  No pneumothorax or pleural effusion. Left subclavian central line tip overlies the left innominate vein near the innominate vein confluence. Right IJ central line tip overlies the upper right atrium. The visualized bony structures of the thorax are intact. Telemetry leads overlie the chest. IMPRESSION: 1. Low volume film with pulmonary vascular congestion. 2. Right IJ central line tip overlies the upper right atrium. Stable left subclavian line. Electronically Signed   By: Misty Stanley M.D.   On: 04/29/2019 15:34   Dg Fluoro Guide Cv Line-no Report  Result Date: 04/29/2019 Fluoroscopy was utilized by the requesting physician.  No radiographic interpretation.     Medications:      Scheduled Medications: . sodium chloride   Intravenous Once  . allopurinol  100 mg Oral Daily  . amiodarone  400 mg Oral BID  . apixaban  5 mg Oral BID  . aspirin EC  81 mg Oral Daily  . B-complex with vitamin C  1 tablet Oral Daily  . bisacodyl  10 mg Oral Daily   Or  . bisacodyl  10 mg Rectal Daily  . Chlorhexidine Gluconate Cloth  6 each Topical Daily  . Chlorhexidine Gluconate Cloth  6 each Topical Q0600  . cinacalcet  30 mg Oral Q supper  . [START ON 05/04/2019] darbepoetin (ARANESP) injection - DIALYSIS  60 mcg Intravenous Q Mon-HD  . fluticasone  2 spray Each Nare Daily  . influenza vaccine adjuvanted  0.5 mL Intramuscular Tomorrow-1000  . mouth rinse  15 mL Mouth Rinse BID  . midodrine  5 mg Oral Q M,W,F  . pantoprazole  40 mg Oral Daily  . pneumococcal 23 valent vaccine  0.5 mL Intramuscular Tomorrow-1000  . psyllium  1 packet Oral BID  . rosuvastatin  10 mg Oral q1800  . sevelamer carbonate  800 mg Oral TID WC  . sodium chloride flush  10-40 mL Intracatheter Q12H  . sodium chloride flush  3 mL Intravenous Q12H    Infusions: . sodium chloride 10 mL/hr at 04/21/19 2209  . sodium chloride 500 mL (04/29/19 1156)  . ferric gluconate (FERRLECIT/NULECIT) IV 125 mg (04/29/19 1819)  . lactated ringers    . lactated ringers    . lactated ringers Stopped (04/22/19 0900)    PRN Medications: Place/Maintain arterial line **AND** sodium chloride, sodium chloride, heparin, hydrocortisone, lactated ringers, menthol-cetylpyridinium, metoprolol tartrate, morphine injection, ondansetron (ZOFRAN) IV, oxyCODONE, sodium chloride flush, sodium chloride flush, traMADol, witch hazel-glycerin   Assessment/Plan   1. Shock: Suspect initial cardiogenic shock in setting of atypical atrial flutter with RVR and ischemia from severe left main stenosis. May have developed a bit of sepsis omponent on 9/25.  Echo 9/25 EF 30%.  He is now off dobutamine and norepinephrine.  BP stable.  - Now on  midodrine 5 mg MWF pre-HD.   2. CAD: Initially suspected acute anterior MI with occluded mid LAD.  However, suspect this was CTO.  Dr. Angelena Form did angioplasty but unable to restore flow.  Patient had a 90% distal left main stenosis compromising flow in LCx and large diagonal. Suspect this led to ischemia/chest pain when the patient went into rapid atrial flutter prior to admission.  Patient taken urgently for CABG supported by Impella. S/P CABG x3 on 9/24 (unable to graft LAD).  - ASA 81.  - Continue Crestor 10 mg daily (maximum for ESRD).  - On eliquis 5 mg twice a day for AF.  - Add low dose bisoprolol 2.5 daily on non-HD days.   3.  Acute on chronic systolic CHF: Echo with EF in 30% range with peri-apical severe hypokinesis.  Ischemic cardiomyopathy.  CVP 5. Suspect co-ox inaccurate as above, will resend.  He is clinically stable.  - Adding low dose bisoprolol on non-HD days as above.   - Volume management by HD.    4. Paroxysmal Atrial flutter/fib: He had atypical atrial flutter initially, post-op had atrial fibrillation.  On and off atrial fibrillation, rate is controlled when he goes in AF.  He is in NSR this morning.  - Decrease amiodarone to 200 mg bid.    - On eliquis 5 mg twice a day.  5. ESRD: Recent start on HD.  Nephrology following. CVVH stopped 10/2.  - HD MWF.  6. ID: No fever, pending CBC.  Completed course of Zosyn.  - HD catheter changed.  7. Anemia: 1 unit PRBCs with HD 10/5, appropriate rise in hgb. Hgb 7.3 today, fairly stable.  Tranfuse < 7.   8. Thrombocytopenia: Recovered. HIT negative.    From my standpoint, ready for SNF.   Loralie Champagne  04/30/2019 10:44 AM

## 2019-05-01 ENCOUNTER — Inpatient Hospital Stay (HOSPITAL_COMMUNITY): Payer: Medicare Other

## 2019-05-01 ENCOUNTER — Inpatient Hospital Stay (HOSPITAL_COMMUNITY): Payer: Medicare Other | Admitting: Occupational Therapy

## 2019-05-01 DIAGNOSIS — G5692 Unspecified mononeuropathy of left upper limb: Secondary | ICD-10-CM | POA: Diagnosis present

## 2019-05-01 DIAGNOSIS — R5381 Other malaise: Principal | ICD-10-CM

## 2019-05-01 DIAGNOSIS — M25569 Pain in unspecified knee: Secondary | ICD-10-CM | POA: Diagnosis present

## 2019-05-01 MED ORDER — HEPARIN SODIUM (PORCINE) 1000 UNIT/ML DIALYSIS
4000.0000 [IU] | INTRAMUSCULAR | Status: DC | PRN
  Filled 2019-05-01: qty 4

## 2019-05-01 MED ORDER — PHENOL 1.4 % MT LIQD
2.0000 | OROMUCOSAL | Status: DC | PRN
  Filled 2019-05-01: qty 177

## 2019-05-01 MED ORDER — PRO-STAT SUGAR FREE PO LIQD
30.0000 mL | Freq: Two times a day (BID) | ORAL | Status: DC
  Administered 2019-05-01 – 2019-05-09 (×17): 30 mL via ORAL
  Filled 2019-05-01 (×17): qty 30

## 2019-05-01 MED ORDER — DICLOFENAC SODIUM 1 % TD GEL
2.0000 g | Freq: Four times a day (QID) | TRANSDERMAL | Status: DC
  Administered 2019-05-01 – 2019-05-09 (×27): 2 g via TOPICAL
  Filled 2019-05-01: qty 100

## 2019-05-01 NOTE — Progress Notes (Addendum)
Pt refusing CPAP for tonight. Pt states he is too uncomfortable when he is wearing it, and asked me to remove machine from his room. CPAP taken out at this time and order discontinued per RT protocol.

## 2019-05-01 NOTE — Progress Notes (Signed)
Occupational Therapy Session Note  Patient Details  Name: Alex Williams MRN: [id number] Date of Birth: Sep 22, 1950  Today's Date: 05/01/2019 OT Individual Time: [id number] OT Individual Time Calculation (min): 60 min    Short Term Goals: Week 1:    STG=LTG due to LOS  Skilled Therapeutic Interventions/Progress Updates:    Pt seen for OT session focusing on ADL re-training, functional standing balance/endurance and use of L UE. Pt asleep in supine upon arrival, agreeable to tx session. Complaints of surgical site soreness and back soreness. Denied need for intervention at start of session, however, at end of session pt requesting to alert RN of need for pain meds.  He transferred to sitting EOB with min A. He ambulated within bathroom with CGA using RW, VCs for RW management in functional context. Toileting task completed with steadying assist. Sit<>stand from [facility] over toilet with guarding assist.  Throughout session and functional mobility and transfers, pt required VCs for adherence to sternal pre-cautions.  Pt taken to therapy gym total A in w/c for time and energy conservation. Retrieved better fitting w/c and cushion.  Transitioned to therapy mat. Completed obstacle course of weaving through cones to address activity tolerance and balance. Pt voiced PLE 5/10 following ~10ft ambulation. Following seated rest break, completed standing table top activity focusing on pt full extending/flexing 4th and 5th digits of L hand, reports decreased numbness and functional grip strength following surgery. Pt able to use at mod I level with VCs for awareness of positioning and use. Education provided regarding how to incoroportateL UE into functional tasks to cont to use and strengthen. Completed 1 trial of standing pipe tree task, tolerating ~2 minutes in standing before requiring seated rest break. 3rd trial completed seated EOM. He ambulated ~59ft back towards room at end of session with RW. Min-mod A to  stand from lower height surfaces when fatigued while maintaining sternal pre-cautions.  Pt taken remainder of way back to room at end of session in w/c. Left seated in w/c with all needs in reach.   Therapy Documentation Precautions:  Precautions Precautions: Fall, Sternal Precaution Comments: reviewed precautions with patient  Restrictions Weight Bearing Restrictions: Yes RUE Weight Bearing: Non weight bearing RUE Partial Weight Bearing Percentage or Pounds: sternal LUE Weight Bearing: Non weight bearing   Therapy/Group: Individual Therapy  Exilda Wilhite L 05/01/2019, 12:26 PM

## 2019-05-01 NOTE — Progress Notes (Signed)
Pt experiencing increased urgency to void. Pt has been assisted to bathroom by staff 6x with no success of voiding. Bladder scanned showed 0, but pt states he feels the urge to void. Provider will be notified during morning rounds.

## 2019-05-01 NOTE — Progress Notes (Signed)
CARDIAC REHAB PHASE I   Have been following pt from distance. Pt progressing but not to the point where he was able to participate with cardiac rehab. Will refer to CRP [facility] [place] for when pt progresses out of inpatient rehab.   Rufina Falco, RN BSN 05/01/2019 7:49 AM

## 2019-05-01 NOTE — Evaluation (Signed)
Occupational Therapy Assessment and Plan  Patient Details  Name: Alex Williams MRN: [id number] Date of Birth: [birth year]  OT Diagnosis: acute pain and muscle weakness (generalized) Rehab Potential: Rehab Potential (ACUTE ONLY): Excellent ELOS: 5-7 days   Today's Date: 05/01/2019 OT Individual Time: 0800-0901 OT Individual Time Calculation (min): 61 min     Problem List:  Patient Active Problem List   Diagnosis Date Noted  . Recurrent knee pain 05/01/2019  . 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 Debility 04/30/2019  . ESRD on dialysis ([place])   . Hemodialysis-associated hypotension   . Atrial fibrillation with rapid ventricular response ([place])   . Acute on chronic anemia   . 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 Coronary artery disease 04/16/2019  . Acute ST elevation myocardial infarction (STEMI) involving left anterior descending (LAD) coronary artery ([place])   . Cardiogenic shock ([place])   . Acute ST elevation myocardial infarction (STEMI) of anterior wall ([place])   . Acute on chronic renal failure ([place]) 08/26/2018  . Nonspecific abnormal electrocardiogram (ECG) (EKG) 08/25/2018  . Anemia in CKD (chronic kidney disease) 08/25/2018  . Elevated troponin 08/25/2018  . SOB (shortness of breath) 08/25/2018  . Acute pulmonary edema (HCC)   . Acute renal failure superimposed on stage 5 chronic kidney disease, not on chronic dialysis ([place]) 02/13/2018  . Morbid obesity with BMI of 45.0-49.9, adult ([place]) 12/03/2013  . Renal mass 12/02/2013  . Lower extremity edema 03/16/2013  . CKD (chronic kidney disease), stage III 12/10/2011  . GOUT 01/30/2010  . Obstructive sleep apnea 01/27/2009  . HYPERCHOLESTEROLEMIA 06/30/2007  . Essential hypertension 06/27/2007  . Osteoarthritis 06/27/2007  . LOW BACK PAIN 06/27/2007  . History of colonic polyps 06/27/2007  . NEPHROLITHIASIS, HX OF 06/27/2007    Past Medical History:  Past Medical History:  Diagnosis Date  . Cervical  disc disease   . Chronic kidney disease   . COLONIC POLYPS, HX OF 06/27/2007  . EXOGENOUS OBESITY 01/30/2010  . GERD (gastroesophageal reflux disease)    PMH  . GOUT 01/30/2010  . HYPERCHOLESTEROLEMIA 06/30/2007  . HYPERTENSION 06/27/2007  . LOW BACK PAIN 06/27/2007  . NEPHROLITHIASIS, HX OF 06/27/2007  . OSTEOARTHRITIS 06/27/2007  . SLEEP APNEA, OBSTRUCTIVE, MODERATE 01/27/2009  . Wears dentures    upper   Past Surgical History:  Past Surgical History:  Procedure Laterality Date  . AV FISTULA PLACEMENT Right 01/19/2019   Procedure: RIGHT BRACHIOCEPHALIC ARTERIOVENOUS (AV) FISTULA CREATION;  Surgeon: Angelia Mould, MD;  Location: [facility];  Service: Vascular;  Laterality: Right;  . CARDIAC CATHETERIZATION    . CARPAL TUNNEL RELEASE     left hand  . COLONOSCOPY     with polypectomy  . CORONARY ARTERY BYPASS GRAFT N/A 04/16/2019   Procedure: CORONARY ARTERY BYPASS GRAFTING (CABG)X3  , WITH ENDOSCOPIC HARVESTING OF RIGHT GREATER SAPHENOUS VEIN;  Surgeon: Lajuana Matte, MD;  Location: [facility];  Service: Open Heart Surgery;  Laterality: N/A;  . CORONARY/GRAFT ACUTE MI REVASCULARIZATION N/A 04/16/2019   Procedure: CORONARY/GRAFT ACUTE MI REVASCULARIZATION;  Surgeon: Burnell Blanks, MD;  Location: [facility];  Service: Cardiovascular;  Laterality: N/A;  . INSERTION OF DIALYSIS CATHETER N/A 04/29/2019   Procedure: INSERTION OF TUNNELED DIALYSIS CATHETER, right internal jugular;  Surgeon: Rosetta Posner, MD;  Location: [facility];  Service: Vascular;  Laterality: N/A;  . JOINT REPLACEMENT Left 2005   knee  . LUMBAR LAMINECTOMY    . MULTIPLE TOOTH EXTRACTIONS    .  RADIOFREQUENCY ABLATION KIDNEY    . RIGHT/LEFT HEART CATH AND CORONARY ANGIOGRAPHY N/A 04/16/2019   Procedure: RIGHT/LEFT HEART CATH AND CORONARY ANGIOGRAPHY;  Surgeon: Burnell Blanks, MD;  Location: [facility];  Service: Cardiovascular;  Laterality: N/A;  . TEE WITHOUT CARDIOVERSION N/A 04/16/2019    Procedure: TRANSESOPHAGEAL ECHOCARDIOGRAM (TEE);  Surgeon: Lajuana Matte, MD;  Location: [facility];  Service: Open Heart Surgery;  Laterality: N/A;  . TOTAL KNEE ARTHROPLASTY     left  . VENTRICULAR ASSIST DEVICE INSERTION N/A 04/16/2019   Procedure: VENTRICULAR ASSIST DEVICE INSERTION;  Surgeon: Burnell Blanks, MD;  Location: [place] [facility];  Service: Cardiovascular;  Laterality: N/A;    Assessment & Plan Clinical Impression: Patient is a 68 y.o. year old male with recent admission to the hospital on 04/16/2019 with chest pain and palpitations.  Echocardiogram performed which showed an ejection fraction of approximately 25%.  Suspect cardiogenic shock in setting of atypical atrial flutter with RVR and ischemia.  Cardiac catheterization 90% distal left main stenosis into the LCx, totally occluded mid LAD.  Initial attempts at angioplasty but unable to restore flow.  Patient underwent emergent medius sternotomy and CABG x3 on 04/16/2019 with Dr. Kipp Brood.  Hospital course complicated by pain management.  Hemodialysis ongoing after initially receiving CRRT and patient did receive a tunneled dialysis catheter 04/29/2019 per Dr. Donnetta Hutching.  .  Patient transferred to CIR on 04/30/2019 .    Patient currently requires min with basic self-care skills secondary to muscle weakness, unbalanced muscle activation and decreased standing balance and decreased balance strategies.  Prior to hospitalization, patient could complete ADLs with independent .  Patient will benefit from skilled intervention to decrease level of assist with basic self-care skills prior to discharge home with care partner.  Anticipate patient will require intermittent supervision and follow up outpatient.  OT - End of Session Activity Tolerance: Decreased this session Endurance Deficit: Yes Endurance Deficit Description: dyspnea w/exertion, requires 5+ min rest breaks due to limited cardiovascular endurance OT Assessment Rehab  Potential (ACUTE ONLY): Excellent OT Patient demonstrates impairments in the following area(s): Balance;Endurance;Motor;Pain OT Basic ADL's Functional Problem(s): Grooming;Bathing;Dressing;Toileting OT Transfers Functional Problem(s): Toilet OT Additional Impairment(s): Fuctional Use of Upper Extremity OT Plan OT Intensity: Minimum of 1-2 x/day, 45 to 90 minutes OT Frequency: 5 out of 7 days OT Duration/Estimated Length of Stay: 5-7 days OT Treatment/Interventions: Balance/vestibular training;Patient/family education;Self Care/advanced ADL retraining;Discharge planning;Therapeutic Activities;UE/LE Coordination activities;DME/adaptive equipment instruction;Functional mobility training;Therapeutic Exercise;Neuromuscular re-education;Community reintegration;UE/LE Strength taining/ROM OT Self Feeding Anticipated Outcome(s): independent OT Basic Self-Care Anticipated Outcome(s): modified independent OT Toileting Anticipated Outcome(s): modified independent level OT Bathroom Transfers Anticipated Outcome(s): modified independent level OT Recommendation Patient destination: Home Follow Up Recommendations: Home health OT Equipment Recommended: To be determined   Skilled Therapeutic Intervention Pt completed selfcare retraining sit to stand at the EOB during session.  He was able to state his sternal precautions as well as follow them for supine to sit.  Min assist was needed for sit to stand from the EOB as well as the toilet.  He does exhibit increased right knee pain with mobility as well as a left hand palsy likely from positioning during surgery.  He presents with decreased active MP extension as well as decreased strength with finger flexion, as well as wrist flexion.  Increased hand/digit numbness more severe at the ulnar side of the hand from the middle digit laterally.  He was able to use the LUE for tasks at a diminished level  such as donning his pants and donning his socks.  Min guard assist  for standing at the sink and completing oral hygiene.  Finished session with transfer to the recliner.  Call button and phone in reach with safety chair alarm in place.    OT Evaluation Precautions/Restrictions  Precautions Precautions: Fall;Sternal Precaution Comments: reviewed precautions with patient  Restrictions Weight Bearing Restrictions: Yes RUE Partial Weight Bearing Percentage or Pounds: sternal   Vital Signs Therapy Vitals Temp: 98 F (36.7 C) Temp Source: Oral Pulse Rate: 76 Resp: 18 BP: (!) 111/50 Patient Position (if appropriate): Lying Oxygen Therapy SpO2: 96 % O2 Device: Room Air Pain Pain Assessment Pain Scale: Faces Faces Pain Scale: Hurts a little bit Pain Type: Surgical pain Pain Location: Sternum Pain Orientation: Mid Pain Descriptors / Indicators: Discomfort Home Living/Prior Functioning Home Living Family/patient expects to be discharged to:: Private residence Living Arrangements: Alone Available Help at Discharge: Family, Available 24 hours/day(discharging to his sister's house) Type of Home: House Home Access: Level entry(no steps, only one at the front door) Bathroom Shower/Tub: Gaffer, Chiropodist: Standard Bathroom Accessibility: Yes  Lives With: Alone IADL History Homemaking Responsibilities: Yes(When pt is at his house) Meal Prep Responsibility: Primary Laundry Responsibility: Primary Cleaning Responsibility: Primary Shopping Responsibility: Primary Current License: Yes Mode of Transportation: Car Occupation: Other (comment) Type of Occupation: semi retired, he goes 2-3 days a week.  His son is taking it over Leisure and Hobbies: Personal assistant Prior Function Level of Independence: Independent with basic ADLs ADL ADL Eating: Independent Where Assessed-Eating: Wheelchair Grooming: Minimal assistance Where Assessed-Grooming: Edge of bed Upper Body Bathing: Supervision/safety Where Assessed-Upper  Body Bathing: Edge of bed Lower Body Bathing: Minimal assistance Where Assessed-Lower Body Bathing: Edge of bed Upper Body Dressing: Supervision/safety Where Assessed-Upper Body Dressing: Edge of bed Lower Body Dressing: Minimal assistance Where Assessed-Lower Body Dressing: Edge of bed Toileting: Minimal assistance Where Assessed-Toileting: Glass blower/designer: Psychiatric nurse Method: Counselling psychologist: Raised toilet seat Vision Baseline Vision/History: Wears glasses Wears Glasses: Reading only Patient Visual Report: No change from baseline Vision Assessment?: No apparent visual deficits Perception  Perception: Within Functional Limits Praxis Praxis: Intact Cognition Overall Cognitive Status: Within Functional Limits for tasks assessed Arousal/Alertness: Awake/alert Orientation Level: Person;Place;Situation Year: 2020 Month: September Day of Week: Correct Memory: Appears intact Attention: Sustained;Selective Sustained Attention: Appears intact Selective Attention: Appears intact Awareness: Appears intact Safety/Judgment: Appears intact Sensation Sensation Light Touch: Impaired Detail Peripheral sensation comments: left dorsal hand impairment, more affected on the ulnar side and less on the radial side, mimicing ulnar nerve distribution Light Touch Impaired Details: Impaired LUE Coordination Gross Motor Movements are Fluid and Coordinated: No Fine Motor Movements are Fluid and Coordinated: No Coordination and Movement Description: Decreased left hand digit extension in digits 3-5 at the MPs with decreased opposition of thumb to 4th and 5th digits.  Decreased functional use overall but currently integrating at a diminshed level with supervision for dressing tasks. Motor  Motor Motor: Other (comment) Motor - Skilled Clinical Observations: LUE hand motor function impairment Mobility  Bed Mobility Bed Mobility: Rolling Right;Supine  to Sit;Rolling Left;Sitting - Scoot to Edge of Bed;Scooting to [employer];Right Sidelying to Sit;Sit to Supine;Sit to Sidelying Right Rolling Right: Supervision/verbal cueing Rolling Left: Supervision/Verbal cueing Right Sidelying to Sit: (Pt adheres to "move in tube" restrictions, min assist required to complete transition) Supine to Sit: Supervision/Verbal cueing Sitting - Scoot to Edge of Bed: Contact Guard/Touching assist Sit to Supine: Minimal  Assistance - Patient > 75% Sit to Sidelying Right: Minimal Assistance - Patient > 75% Scooting to HOB: Minimal Assistance - Patient > 75% Transfers Sit to Stand: Minimal Assistance - Patient > 75% Stand to Sit: Minimal Assistance - Patient > 75%  Trunk/Postural Assessment  Cervical Assessment Cervical Assessment: Within Functional Limits Thoracic Assessment Thoracic Assessment: Within Functional Limits Lumbar Assessment Lumbar Assessment: Within Functional Limits Postural Control Postural Control: Within Functional Limits  Balance Balance Balance Assessed: Yes Static Sitting Balance Static Sitting - Balance Support: Feet supported Static Sitting - Level of Assistance: 5: Stand by assistance Static Sitting - Comment/# of Minutes: 5 Dynamic Sitting Balance Dynamic Sitting - Balance Support: During functional activity Dynamic Sitting - Level of Assistance: 5: Stand by assistance Reach (Patient is able to reach ___ inches to right, left, forward, back): 12. limited by back pain but appears to be WNLs Static Standing Balance Static Standing - Balance Support: During functional activity Static Standing - Level of Assistance: 4: Min assist Static Standing - Comment/# of Minutes: 1 Dynamic Standing Balance Dynamic Standing - Balance Support: During functional activity Dynamic Standing - Level of Assistance: 4: Min assist Extremity/Trunk Assessment RUE Assessment RUE Assessment: Exceptions to WFL(demonstrates full AROM within sternotomy  precs) General Strength Comments: sternotomy LUE Assessment LUE Assessment: Exceptions to [facility] Passive Range of Motion (PROM) Comments: WFLs Active Range of Motion (AROM) Comments: Gross digit AROM for flexion  WFLS.  Limited digit extension in the MPS and PIPs of digits 3-5 General Strength Comments: Grip strength 3+/5, wrist flexion 3/5, wrist extension 4/5.  Strength grossly at least 3+/5 throughout in the shoulder and elbow but not formally assessed secondary to precautions.     Refer to Care Plan for Long Term Goals  Recommendations for other services: None    Discharge Criteria: Patient will be discharged from OT if patient refuses treatment 3 consecutive times without medical reason, if treatment goals not met, if there is a change in medical status, if patient makes no progress towards goals or if patient is discharged from hospital.  The above assessment, treatment plan, treatment alternatives and goals were discussed and mutually agreed upon: by patient  Annabell Oconnor OTR/L 05/01/2019, 4:08 PM

## 2019-05-01 NOTE — Progress Notes (Signed)
Inpatient Rehabilitation  Patient information reviewed and entered into eRehab system by Seymone Forlenza M. Desira Alessandrini, M.A., CCC/SLP, PPS Coordinator.  Information including medical coding, functional ability and quality indicators will be reviewed and updated through discharge.    

## 2019-05-01 NOTE — Progress Notes (Signed)
Initial Nutrition Assessment  RD working remotely.  DOCUMENTATION CODES:   Obesity unspecified  INTERVENTION:   - Magic cup TID with meals, each supplement provides 290 kcal and 9 grams of protein  - Pro-stat 30 ml BID, each supplement provides 100 kcal and 15 grams of protein  - Encourage adequate PO intake  - d/c Ensure Enlive  NUTRITION DIAGNOSIS:   Increased nutrient needs related to chronic illness as evidenced by estimated needs.  GOAL:   Patient will meet greater than or equal to 90% of their needs  MONITOR:   PO intake, Supplement acceptance, Labs, Weight trends, Skin, I & O's  REASON FOR ASSESSMENT:   Malnutrition Screening Tool    ASSESSMENT:   68 year old male with PMH of ESRD recently started on HD, CHF, HLD, HTN, gout, GERD. Presented 04/16/19 with chest pain and palpitations. Echocardiogram performed which showed an ejection fraction of approximately 25%. Suspected cardiogenic shock in setting of atypical atrial flutter with RVR and ischemia. Cardiac catheterization 90% distal left main stenosis into the LCx, totally occluded mid LAD. Initial attempts at angioplasty but unable to restore flow. Pt underwent emergent medius sternotomy and CABG x 3 on 04/16/19. Hemodialysis ongoing after initially receiving CRRT. [place] placed 04/29/19. Pt admitted to CIR on 10/8.   RD is familiar with this pt from acute admission.  Last HD on 10/7 with post-dialysis weight of 124.5 kg and net UF of 432 ml.  Spoke with pt via phone call to room. Pt reports that his appetite is improving and he is still eating about half of the food on his meal trays. When RD spoke with pt previously, he revealed typically having a great appetite and eating 2-4 meals daily. PTA.  Reviewed weight history in chart. Pt's weight has been trending down over he last 3 months. Pt with a 14.7 kg weight loss since 01/26/19. This is a 10.2% weight loss which is significant for timeframe. Difficult to determine  how much of weight loss is related to starting HD vs true weight loss. Will monitor trends.  Pt reports he is willing to take Pro-stat and Magic Cup but not Ensure Enlive. RD will adjust orders appropriately.  Meal Completion: 75% x 1 meal  Medications reviewed and include: B-complex with vitamin C, Dulcolax, Sensipar, Ensure Enlive BID, Protonix, psyllium, Renvela TID, ferric gluconate with HD  Labs reviewed: hemoglobin 7.3  NUTRITION - FOCUSED PHYSICAL EXAM:  Unable to complete at this time. RD working remotely.  Diet Order:   Diet Order            Diet Heart Room service appropriate? Yes; Fluid consistency: Thin  Diet effective now              EDUCATION NEEDS:   Education needs have been addressed  Skin:  Skin Assessment: Skin Integrity Issues: Skin Integrity Issues: Incisions: chest, right leg  Last BM:  04/29/19  Height:   Ht Readings from Last 1 Encounters:  04/20/19 6' (1.829 m)    Weight:   Wt Readings from Last 1 Encounters:  04/30/19 129.1 kg    Ideal Body Weight:  80.9 kg  BMI:  Body mass index is 38.6 kg/m.  Estimated Nutritional Needs:   Kcal:  [id number]  Protein:  130-150 grams  Fluid:  1000 ml + UOP    Gaynell Face, MS, RD, LDN Inpatient Clinical Dietitian Pager: [phone] Weekend/After Hours: [phone]

## 2019-05-01 NOTE — Progress Notes (Signed)
[place] PHYSICAL MEDICINE & REHABILITATION PROGRESS NOTE   Subjective/Complaints:  Pt reports that has hacking cough, but has been getting better- juicy but not productive per pt.  Feeling overall good- has some chest/sternal pain not cardiac CP- he was clear- some R knee pain which is chronic and back pain which is chronic.  Admits L hand isn't working like it was before surgery.    Objective:   Dg Chest Port 1 View  Result Date: 04/29/2019 CLINICAL DATA:  Status post dialysis catheter insertion. EXAM: PORTABLE CHEST 1 VIEW COMPARISON:  04/28/2019 FINDINGS: 1342 hours. Low volume film. The cardio pericardial silhouette is enlarged. There is pulmonary vascular congestion without overt pulmonary edema. No pneumothorax or pleural effusion. Left subclavian central line tip overlies the left innominate vein near the innominate vein confluence. Right IJ central line tip overlies the upper right atrium. The visualized bony structures of the thorax are intact. Telemetry leads overlie the chest. IMPRESSION: 1. Low volume film with pulmonary vascular congestion. 2. Right IJ central line tip overlies the upper right atrium. Stable left subclavian line. Electronically Signed   By: Misty Stanley M.D.   On: 04/29/2019 15:34   Dg Fluoro Guide Cv Line-no Report  Result Date: 04/29/2019 Fluoroscopy was utilized by the requesting physician.  No radiographic interpretation.   Recent Labs    04/29/19 1640 04/30/19 0450  WBC 12.6* 12.3*  HGB 7.4* 7.3*  HCT 23.4* 24.2*  PLT 300 297   Recent Labs    04/29/19 1933 04/30/19 0450  NA 137 138  K 4.2 4.2  CL 101 100  CO2 23 26  GLUCOSE 108* 84  BUN 64* 27*  CREATININE 8.31* 4.85*  CALCIUM 11.3* 9.9    Intake/Output Summary (Last 24 hours) at 05/01/2019 1307 Last data filed at 05/01/2019 0900 Gross per 24 hour  Intake 240 ml  Output -  Net 240 ml     Physical Exam: Vital Signs Blood pressure (!) 113/52, pulse 80, temperature 98.1 F  (36.7 C), temperature source Oral, resp. rate 17, weight 129.1 kg, SpO2 93 %.  Physical Exam  Vitals and labs reviewed. Constitutional: pt awake, alert, appropriate, sitting on EOB in room; PT at bedside, NAD HENT:  Head: Normocephalic and atraumatic.  Eyes: conjugate gaze.  Neck: No tracheal deviation present. No thyromegaly present CV- RRR with an occ extra beat heard- quiet heart sounds .  Respiratory:  Has fair to good air movement with a few crackles heard in bases B/L- no wheezing; intermittent hacking juicy sounding cough- not productive GI: soft, NT, ND, (+BS Musculoskeletal:     Comments: Edema and bilateral lower extremities and left upper extremity- L hand esp is swollen  TTP over R knee esp over patella L hand- limited extension of fingers Neurological: He is alert.  Follows full commands.   Cooperative with exam. Motor: Right upper extremity: 5/5 proximal distal Left upper extremity: prox 5/5; hand 2-4/5 - mainly lacking extension Bilateral lower extremities: 4--4/5 proximal distal Sensation diminished to light touch left hand  Esp in ulnar distribution, however also mildly affected in median distribution, just not to same extent as ulnar.  Skin:  Midline chest incision C/D/I  Psychiatric: full affect    Assessment/Plan: 1. Functional deficits secondary to debility/ NSTEMI and CABG x 3 vessels and new ESRD/with HD which require 3+ hours per day of interdisciplinary therapy in a comprehensive inpatient rehab setting.  Physiatrist is providing close team supervision and 24 hour management of active medical problems  listed below.  Physiatrist and rehab team continue to assess barriers to discharge/monitor patient progress toward functional and medical goals  Care Tool:  Bathing              Bathing assist       Upper Body Dressing/Undressing Upper body dressing        Upper body assist      Lower Body Dressing/Undressing Lower body dressing             Lower body assist       Toileting Toileting    Toileting assist Assist for toileting: Contact Guard/Touching assist Assistive Device Comment: (walker)   Transfers Chair/bed transfer  Transfers assist           Locomotion Ambulation   Ambulation assist              Walk 10 feet activity   Assist           Walk 50 feet activity   Assist           Walk 150 feet activity   Assist           Walk 10 feet on uneven surface  activity   Assist           Wheelchair     Assist               Wheelchair 50 feet with 2 turns activity    Assist            Wheelchair 150 feet activity     Assist          Blood pressure (!) 113/52, pulse 80, temperature 98.1 F (36.7 C), temperature source Oral, resp. rate 17, weight 129.1 kg, SpO2 93 %.  Medical Problem List and Plan: 1.  Debility secondary to non-STEMI status post CABG x3 04/16/2019.  Sternal precautions                 Admit to CIR  10/9- pt also has peripheral neuropathy, maybe due to OR positioning of LUE- will likely need EMG but cannot be done for 3+ weeks after nerve compression 2.  Antithrombotics: -DVT/anticoagulation: Eliquis             -antiplatelet therapy: Aspirin 81 mg daily 3. Pain Management: Oxycodone/Ultram as needed  10/9- will order voltaren gel for R knee QID 2g 4. Mood: Provide emotional support             -antipsychotic agents: N/A 5. Neuropsych: This patient is capable of making decisions on his own behalf. 6. Skin/Wound Care: Routine skin checks 7. Fluids/Electrolytes/Nutrition: Routine in and outs.  CMP ordered for tomorrow a.m.  10/9- electrolytes look great/stable 8.  Acute on chronic anemia.  CBC ordered for tomorrow a.m.  Continue Aranesp  10/9- Hb stable at 7.3 (was 7.4) 9.  Atrial fibrillation with RVR.  Amiodarone 200 mg twice daily.  Cardiac rate controlled             Monitor with increased physical exertion. 10.   Hypotension.  ProAmatine 5 mg Monday Wednesday Friday             Monitor for symptoms 11.  ESRD on hemodialysis.  Status post tunneled catheter 04/29/2019- no shower/bed baths with catheter.  12.  Hyperlipidemia.  Crestor 13.  Constipation with hemorrhoids.  Laxative assistance.  Continue Tucks pads 14. LUE nerve compression- will need EMG after Rehab D/c. 15. Edema- will see if OT can get  compression glove for edema control or kineseotaping.  16. Leukocytosis- afebrile and no signs of infection- WBC 12.3 down from 12.6- will recheck Monday or earlier if signs of infection.  LOS: 1 days A FACE TO FACE EVALUATION WAS PERFORMED  Alex Williams 05/01/2019, 1:07 PM

## 2019-05-01 NOTE — Progress Notes (Signed)
Social Work Assessment and Plan   Patient Details  Name: Alex Williams MRN: [id number] Date of Birth: October 25, 1950  Today's Date: 05/01/2019  Problem List:  Patient Active Problem List   Diagnosis Date Noted  . Recurrent knee pain 05/01/2019  . 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 Debility 04/30/2019  . ESRD on dialysis ([place])   . Hemodialysis-associated hypotension   . Atrial fibrillation with rapid ventricular response ([place])   . Acute on chronic anemia   . 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 Coronary artery disease 04/16/2019  . Acute ST elevation myocardial infarction (STEMI) involving left anterior descending (LAD) coronary artery ([place])   . Cardiogenic shock ([place])   . Acute ST elevation myocardial infarction (STEMI) of anterior wall ([place])   . Acute on chronic renal failure ([place]) 08/26/2018  . Nonspecific abnormal electrocardiogram (ECG) (EKG) 08/25/2018  . Anemia in CKD (chronic kidney disease) 08/25/2018  . Elevated troponin 08/25/2018  . SOB (shortness of breath) 08/25/2018  . Acute pulmonary edema (HCC)   . Acute renal failure superimposed on stage 5 chronic kidney disease, not on chronic dialysis ([place]) 02/13/2018  . Morbid obesity with BMI of 45.0-49.9, adult ([place]) 12/03/2013  . Renal mass 12/02/2013  . Lower extremity edema 03/16/2013  . CKD (chronic kidney disease), stage III 12/10/2011  . GOUT 01/30/2010  . Obstructive sleep apnea 01/27/2009  . HYPERCHOLESTEROLEMIA 06/30/2007  . Essential hypertension 06/27/2007  . Osteoarthritis 06/27/2007  . LOW BACK PAIN 06/27/2007  . History of colonic polyps 06/27/2007  . NEPHROLITHIASIS, HX OF 06/27/2007   Past Medical History:  Past Medical History:  Diagnosis Date  . Cervical disc disease   . Chronic kidney disease   . COLONIC POLYPS, HX OF 06/27/2007  . EXOGENOUS OBESITY 01/30/2010  . GERD (gastroesophageal reflux disease)    PMH  . GOUT 01/30/2010  . HYPERCHOLESTEROLEMIA 06/30/2007  .  HYPERTENSION 06/27/2007  . LOW BACK PAIN 06/27/2007  . NEPHROLITHIASIS, HX OF 06/27/2007  . OSTEOARTHRITIS 06/27/2007  . SLEEP APNEA, OBSTRUCTIVE, MODERATE 01/27/2009  . Wears dentures    upper   Past Surgical History:  Past Surgical History:  Procedure Laterality Date  . AV FISTULA PLACEMENT Right 01/19/2019   Procedure: RIGHT BRACHIOCEPHALIC ARTERIOVENOUS (AV) FISTULA CREATION;  Surgeon: Angelia Mould, MD;  Location: [facility];  Service: Vascular;  Laterality: Right;  . CARDIAC CATHETERIZATION    . CARPAL TUNNEL RELEASE     left hand  . COLONOSCOPY     with polypectomy  . CORONARY ARTERY BYPASS GRAFT N/A 04/16/2019   Procedure: CORONARY ARTERY BYPASS GRAFTING (CABG)X3  , WITH ENDOSCOPIC HARVESTING OF RIGHT GREATER SAPHENOUS VEIN;  Surgeon: Lajuana Matte, MD;  Location: [facility];  Service: Open Heart Surgery;  Laterality: N/A;  . CORONARY/GRAFT ACUTE MI REVASCULARIZATION N/A 04/16/2019   Procedure: CORONARY/GRAFT ACUTE MI REVASCULARIZATION;  Surgeon: Burnell Blanks, MD;  Location: [facility];  Service: Cardiovascular;  Laterality: N/A;  . INSERTION OF DIALYSIS CATHETER N/A 04/29/2019   Procedure: INSERTION OF TUNNELED DIALYSIS CATHETER, right internal jugular;  Surgeon: Rosetta Posner, MD;  Location: [facility];  Service: Vascular;  Laterality: N/A;  . JOINT REPLACEMENT Left 2005   knee  . LUMBAR LAMINECTOMY    . MULTIPLE TOOTH EXTRACTIONS    . RADIOFREQUENCY ABLATION KIDNEY    . RIGHT/LEFT HEART CATH AND CORONARY ANGIOGRAPHY N/A 04/16/2019   Procedure: RIGHT/LEFT HEART CATH AND CORONARY ANGIOGRAPHY;  Surgeon: Burnell Blanks, MD;  Location: [facility];  Service: Cardiovascular;  Laterality: N/A;  . TEE WITHOUT CARDIOVERSION N/A 04/16/2019   Procedure: TRANSESOPHAGEAL ECHOCARDIOGRAM (TEE);  Surgeon: Lajuana Matte, MD;  Location: [facility];  Service: Open Heart Surgery;  Laterality: N/A;  . TOTAL KNEE ARTHROPLASTY     left  . VENTRICULAR ASSIST DEVICE  INSERTION N/A 04/16/2019   Procedure: VENTRICULAR ASSIST DEVICE INSERTION;  Surgeon: Burnell Blanks, MD;  Location: [facility];  Service: Cardiovascular;  Laterality: N/A;   Social History:  reports that he quit smoking about 38 years ago. He has never used smokeless tobacco. He reports current alcohol use of about 1.0 standard drinks of alcohol per week. He reports that he does not use drugs.  Family / Support Systems Marital Status: Divorced Patient Roles: Parent(semi-retired;  brother) Children: son, Deaglan Reiser [facility]) @ [phone];  daughter, Graycen Hadorn [facility]) @ [phone] Other Supports: sister, [name] @ [phone] Anticipated Caregiver: sister and brother-in-law Ability/Limitations of Caregiver: Min A Caregiver Availability: 24/7 Family Dynamics: Pt reports that his family members are all very supportive.  He denies any concerns about plans to stay with sister initially after d/c.  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   Abuse/Neglect Abuse/Neglect Assessment Can Be Completed: Yes Physical Abuse: Denies Verbal Abuse: Denies Sexual Abuse: Denies Exploitation of patient/patient's resources: Denies Self-Neglect: Denies  Emotional Status Pt's affect, behavior and adjustment status: Pt pleasant and completes assessment interview without any difficulty.  He admits frustrations with current limitations, however, denies any signficant emotional distress.  Will monitor and refer for neuropsych if indicated. Recent Psychosocial Issues: None Psychiatric History: None Substance Abuse History: None  Patient / Family Perceptions, Expectations & Goals Pt/Family understanding of illness & functional  limitations: Pt and family with good, general understand of CABG surgery and current functional limitations/ need for CIR. Premorbid pt/family roles/activities: Pt completely independent and living alone. Anticipated changes in roles/activities/participation: likely that sister will temporarily assume primary caregiver support role. Pt/family expectations/goals: "Just need to be a lot stronger."  [employer]: None Premorbid Home Care/DME Agencies: None Transportation available at discharge: yes  Discharge Planning Living Arrangements: Alone Support Systems: Children Type of Residence: Private residence Insurance Resources: Chartered certified accountant Resources: Fish farm manager, Employment Financial Screen Referred: No Living Expenses: Own Money Management: Patient Does the patient have any problems obtaining your medications?: No Home Management: pt Patient/Family Preliminary Plans: pt to d/c to sister's home initially where assistance is available. Social Work Anticipated Follow Up Needs: HH/OP Expected length of stay: 7-10 days  Clinical Impression Pleasant gentleman here for debility following CABG.  Good family support and plans to d/c home with sister and brother-in-law initially.  No significant emotional distress. Will follow for d/c planning needs.  Jeri Jeanbaptiste 05/01/2019, 2:15 PM

## 2019-05-01 NOTE — Progress Notes (Signed)
[facility] Progress Note   Assessment/ Plan:   Dialysis: [facility] MWF - new start, had OP HD on 9/21 and 9/23 prior to admission 3h 132kg 300/600 RUE AVF 2/2 bath Hep none venofer 100 mg ordered each treatment x 4,Hb 8.5 on 9/21 noESA order yet PTH 1366 on 04/15/19   Assessment/ Plan #Cardiogenic shock/STEMI: Status post emergent CABG on 9/24. SP Impella, removed 04/19/19. As per heart failure team and CT surgery.  - midodrine at 15 tid--> 5 TID now, trying to see if he could tolerate off so he could start BB--> but really needs midodrine before HD to help pull vol  # ESRD: new start had 2 OP sessions prior to admission. Required CRRT due to shock from 9/25 - 10/2.  AVF used twice at OP HD w/ sig difficulty per the staff.   - will need new EDW upon discharge - HD now on MWF schedule, CRRT off since 10/2 - VVS s/p TDC  (10/7), outpt fgram in 2 weeks, appreciate assistance  # Anemia: Due to ABLA and CKD: Transfuse bloodprn. Hb down < 8 will start darbe with 60 ug sq today then 60 ug IV q Mon.  Check fe tibc--> Tsat 14, start ferrlicet with HD [id number] for full course.   # Secondary hyperparathyroidism: hypercalcemia prob immobilization. Vit D has been on hold here.  PTH 1366 on 9/23 and OP Ca++ 11.3 9/23  - cont to follow - 2 Ca+ bath - start sensipar 30 mg QHS 10/7--> given high Ca and high PTH, may need OP parathyroid scan - start renvela 800 TID [facility] 10/7 - rec weekly Ca checks at OP HD center  # CAD sp CABG x 3 on 9/24, AS 81 mg daily  # Afib - on PO amio and eliquis  # ID - sp empiric IV abx, dc'd 10/2 - WBC up to 17.6 10/6, CTM, culture if febrile  # possible urinary retention: bladder scans, I/O cath if necessary   # Dispo: in rehab   Subjective:    On dialysis.  Doing well.  Says that rehab is difficult   Objective:   BP (!) 96/47   Pulse 83   Temp 98 F (36.7 C) (Oral)   Resp 17   Wt 126.3 kg   SpO2 96%   BMI 37.76 kg/m    Physical Exam: Gen: NAD, sitting in bed CVS: irregular, well-healed sternal scar Resp: clear bilaterally Abd: soft, nontender, NABS Ext: trace LE edema ACCESS: RUE AVF weak thrill/ bruit, R TDC with dressing c/d/i  Labs: BMET Recent Labs  Lab 04/25/19 0417 04/25/19 1745 04/26/19 0429 04/26/19 1706 04/27/19 0315 04/29/19 1933 04/30/19 0450  NA 138 137 136 135 136 137 138  K 4.0 4.5 4.3 4.6 4.6 4.2 4.2  CL 101 102 100 99 101 101 100  CO2 26 24 24 23 23 23 26   GLUCOSE 98 132* 97 93 95 108* 84  BUN 33* 42* 53* 64* 69* 64* 27*  CREATININE 3.93* 5.02* 6.00* 6.88* 7.58* 8.31* 4.85*  CALCIUM 12.8* 13.1* 12.9* 12.7* 12.5* 11.3* 9.9  PHOS 4.7* 5.7* 6.4* 6.9* 6.9* 7.0*  --    CBC Recent Labs  Lab 04/26/19 0429 04/27/19 0314 04/28/19 0354 04/29/19 1640 04/30/19 0450  WBC 13.0* 12.2* 17.9* 12.6* 12.3*  NEUTROABS 8.2* 8.5* 12.3*  --  8.6*  HGB 7.5* 7.4* 8.9* 7.4* 7.3*  HCT 24.4* 23.5* 29.3* 23.4* 24.2*  MCV 92.4 91.4 92.4 92.5 93.8  PLT 293 285 402*  300 297    @IMGRELPRIORS @ Medications:    . allopurinol  100 mg Oral Daily  . amiodarone  200 mg Oral BID  . apixaban  5 mg Oral BID  . aspirin EC  81 mg Oral Daily  . B-complex with vitamin C  1 tablet Oral Daily  . bisacodyl  10 mg Oral Daily   Or  . bisacodyl  10 mg Rectal Daily  . [START ON 05/02/2019] bisoprolol  2.5 mg Oral Once per day on Sun Tue Thu Sat  . Chlorhexidine Gluconate Cloth  6 each Topical Daily  . cinacalcet  30 mg Oral Q supper  . [START ON 05/04/2019] darbepoetin (ARANESP) injection - DIALYSIS  60 mcg Intravenous Q Mon-HD  . diclofenac sodium  2 g Topical QID  . feeding supplement (PRO-STAT SUGAR FREE 64)  30 mL Oral BID  . fluticasone  2 spray Each Nare Daily  . heparin  1,000 Units Intravenous Q M,W,F-HD  . midodrine  5 mg Oral Q M,W,F-HD  . pantoprazole  40 mg Oral Daily  . psyllium  1 packet Oral BID  . rosuvastatin  10 mg Oral q1800  . sevelamer carbonate  800 mg Oral TID WC      Madelon Lips, MD 05/01/2019, 5:34 PM

## 2019-05-01 NOTE — Evaluation (Signed)
Physical Therapy Assessment and Plan  Patient Details  Name: Alex [facility] MRN: [id number] Date of Birth: [birth year]  PT Diagnosis: Abnormality of gait, Difficulty walking and Muscle weakness Rehab Potential: Excellent ELOS: 7days   Today's Date: 05/01/2019 PT Individual Time: 8250-0370      Problem List:  Patient Active Problem List   Diagnosis Date Noted  . Recurrent knee pain 05/01/2019  . 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 Debility 04/30/2019  . ESRD on dialysis ([place])   . Hemodialysis-associated hypotension   . Atrial fibrillation with rapid ventricular response ([place])   . Acute on chronic anemia   . 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 Coronary artery disease 04/16/2019  . Acute ST elevation myocardial infarction (STEMI) involving left anterior descending (LAD) coronary artery ([place])   . Cardiogenic shock ([place])   . Acute ST elevation myocardial infarction (STEMI) of anterior wall ([place])   . Acute on chronic renal failure ([place]) 08/26/2018  . Nonspecific abnormal electrocardiogram (ECG) (EKG) 08/25/2018  . Anemia in CKD (chronic kidney disease) 08/25/2018  . Elevated troponin 08/25/2018  . SOB (shortness of breath) 08/25/2018  . Acute pulmonary edema (HCC)   . Acute renal failure superimposed on stage 5 chronic kidney disease, not on chronic dialysis ([place]) 02/13/2018  . Morbid obesity with BMI of 45.0-49.9, adult ([place]) 12/03/2013  . Renal mass 12/02/2013  . Lower extremity edema 03/16/2013  . CKD (chronic kidney disease), stage III 12/10/2011  . GOUT 01/30/2010  . Obstructive sleep apnea 01/27/2009  . HYPERCHOLESTEROLEMIA 06/30/2007  . Essential hypertension 06/27/2007  . Osteoarthritis 06/27/2007  . LOW BACK PAIN 06/27/2007  . History of colonic polyps 06/27/2007  . NEPHROLITHIASIS, HX OF 06/27/2007    Past Medical History:  Past Medical History:  Diagnosis Date  . Cervical disc disease   . Chronic kidney disease   . COLONIC POLYPS, HX OF  06/27/2007  . EXOGENOUS OBESITY 01/30/2010  . GERD (gastroesophageal reflux disease)    PMH  . GOUT 01/30/2010  . HYPERCHOLESTEROLEMIA 06/30/2007  . HYPERTENSION 06/27/2007  . LOW BACK PAIN 06/27/2007  . NEPHROLITHIASIS, HX OF 06/27/2007  . OSTEOARTHRITIS 06/27/2007  . SLEEP APNEA, OBSTRUCTIVE, MODERATE 01/27/2009  . Wears dentures    upper   Past Surgical History:  Past Surgical History:  Procedure Laterality Date  . AV FISTULA PLACEMENT Right 01/19/2019   Procedure: RIGHT BRACHIOCEPHALIC ARTERIOVENOUS (AV) FISTULA CREATION;  Surgeon: Angelia Mould, MD;  Location: [place];  Service: Vascular;  Laterality: Right;  . CARDIAC CATHETERIZATION    . CARPAL TUNNEL RELEASE     left hand  . COLONOSCOPY     with polypectomy  . CORONARY ARTERY BYPASS GRAFT N/A 04/16/2019   Procedure: CORONARY ARTERY BYPASS GRAFTING (CABG)X3  , WITH ENDOSCOPIC HARVESTING OF RIGHT GREATER SAPHENOUS VEIN;  Surgeon: Lajuana Matte, MD;  Location: [place];  Service: Open Heart Surgery;  Laterality: N/A;  . CORONARY/GRAFT ACUTE MI REVASCULARIZATION N/A 04/16/2019   Procedure: CORONARY/GRAFT ACUTE MI REVASCULARIZATION;  Surgeon: Burnell Blanks, MD;  Location: [place] [facility];  Service: Cardiovascular;  Laterality: N/A;  . INSERTION OF DIALYSIS CATHETER N/A 04/29/2019   Procedure: INSERTION OF TUNNELED DIALYSIS CATHETER, right internal jugular;  Surgeon: Rosetta Posner, MD;  Location: [place];  Service: Vascular;  Laterality: N/A;  . JOINT REPLACEMENT Left 2005   knee  . LUMBAR LAMINECTOMY    . MULTIPLE TOOTH EXTRACTIONS    . RADIOFREQUENCY ABLATION KIDNEY    .  RIGHT/LEFT HEART CATH AND CORONARY ANGIOGRAPHY N/A 04/16/2019   Procedure: RIGHT/LEFT HEART CATH AND CORONARY ANGIOGRAPHY;  Surgeon: Burnell Blanks, MD;  Location: [place] [facility];  Service: Cardiovascular;  Laterality: N/A;  . TEE WITHOUT CARDIOVERSION N/A 04/16/2019   Procedure: TRANSESOPHAGEAL ECHOCARDIOGRAM (TEE);  Surgeon: Lajuana Matte, MD;  Location: [facility];  Service: Open Heart Surgery;  Laterality: N/A;  . TOTAL KNEE ARTHROPLASTY     left  . VENTRICULAR ASSIST DEVICE INSERTION N/A 04/16/2019   Procedure: VENTRICULAR ASSIST DEVICE INSERTION;  Surgeon: Burnell Blanks, MD;  Location: [place] [facility];  Service: Cardiovascular;  Laterality: N/A;    Assessment & Plan Clinical Impression:  Alex [facility] is a 68 year old right-handed male with history of chronic kidney disease with hemodialysis, systolic congestive heart failure, hyperlipidemia, hypertension, low back pain.  Presented 04/16/2019 with chest pain and palpitations.  Echocardiogram performed which showed an ejection fraction of approximately 25%.  Suspect cardiogenic shock in setting of atypical atrial flutter with RVR and ischemia.  Cardiac catheterization 90% distal left main stenosis into the LCx, totally occluded mid LAD.  Initial attempts at angioplasty but unable to restore flow.  Patient underwent emergent medius sternotomy and CABG x3 on 04/16/2019 with Dr. Kipp Brood.  Hospital course complicated by pain management.  Hemodialysis ongoing after initially receiving CRRT and patient did receive a tunneled dialysis catheter 04/29/2019 per Dr. Donnetta Hutching..  Acute on chronic anemia 7.4 patient did receive 1 unit packed red blood cells 04/27/2019.  Maintained on Eliquis as well as low-dose aspirin.  Cardiac rate remained controlled patient continues on amiodarone follow-up per cardiology services.  Close monitoring of blood pressure continues on ProAmatine Monday Wednesday Fridays.  Patient transferred to CIR on 04/30/2019 .   Patient currently requires min with mobility secondary to muscle weakness and decreased cardiorespiratoy endurance.  Prior to hospitalization, patient was independent  with mobility and lived with Alone in a House home.  Home access is 2Stairs to enter. Pt will be staying with sister who has 2steps to enter w/2rails.  Can enter back entrance  which is level if needed.  Patient will benefit from skilled PT intervention to maximize safe functional mobility, minimize fall risk and decrease caregiver burden for planned discharge home with 24 hour supervision.  Anticipate patient will benefit from HHPT and entrace into cardiac rehab program at discharge.  PT - End of Session Activity Tolerance: Tolerates 30+ min activity with multiple rests Endurance Deficit: Yes Endurance Deficit Description: dyspnea w/exertion, requires 5+ min rest breaks due to limited cardiovascular endurance PT Assessment Rehab Potential (ACUTE/IP ONLY): Excellent PT Barriers to Discharge: Home environment access/layout;Weight bearing restrictions;Weight PT Barriers to Discharge Comments: 3 stepst to enter w/rails, sternotomy precs PT Patient demonstrates impairments in the following area(s): Balance;Sensory;Skin Integrity;Motor;Endurance PT Transfers Functional Problem(s): Bed Mobility;Bed to Chair;Car;Furniture PT Locomotion Functional Problem(s): Ambulation;Wheelchair Mobility;Stairs PT Plan PT Intensity: Minimum of 1-2 x/day ,45 to 90 minutes PT Frequency: 5 out of 7 days PT Duration Estimated Length of Stay: 7days PT Treatment/Interventions: Ambulation/gait training;Stair training;UE/LE Strength taining/ROM;Wheelchair propulsion/positioning;Discharge planning;Therapeutic Activities;UE/LE Coordination activities;Functional mobility training;Patient/family education;Therapeutic Exercise;DME/adaptive equipment instruction PT Transfers Anticipated Outcome(s): mod I PT Locomotion Anticipated Outcome(s): mod I PT Recommendation Follow Up Recommendations: Other (comment)(to be further assessed) Patient destination: Home Equipment Details: to be determined as pt progresses in rx  Skilled Therapeutic Intervention Reviewed "move in the tube" w/patient and pt able to demonstrate and verbalize understanding.  Did require reminders during various mobility tasks such  as  car transfer to adhere to precautions.  HR and 02 sats monitored throughout rx session and no supplemental 02 needed in todays session, but pt experiences dyspnea on exertion and educated via breathing strategies.  STS repeated x 6 from various surface heights and requires from min to cga.  Transfer training w/RW performed and pt able to perform w/cga wc to/from bed.  Gait x 198f w/RW and cga, rest x6 min following due to poor endurance. Negotiated single curb w/RW and assist to lift/lower walker, verbal cues for adherence to sternal precautions, min assist w/difficulty due to fatigue/LE functional weakness.  wc to commode w/RW and cga, cues for precs.  Pt left on commode w/nursing present to assist pt.    PT Evaluation Precautions/Restrictions Precautions Precautions: Sternal;Fall Restrictions Weight Bearing Restrictions: Yes RUE Weight Bearing: Non weight bearing RUE Partial Weight Bearing Percentage or Pounds: Sternal LUE Weight Bearing: Non weight bearing LUE Partial Weight Bearing Percentage or Pounds: sternal General   Vital SignsTherapy Vitals Temp: 98 F (36.7 C) Temp Source: Oral Pulse Rate: 74 Resp: 18 BP: (!) 101/51 Patient Position (if appropriate): Lying Oxygen Therapy SpO2: 96 % O2 Device: Room Air  HR 58-82, 02 sats 92-99 percent  Pain Pain Assessment Pain Scale: 0-10 Pain Score: 5  Pain Type: Chronic pain Pain Location: Back Pain Orientation: Lower Pain Radiating Towards: (back) Pain Descriptors / Indicators: Sore Pain Frequency: Intermittent Pain Onset: On-going Patients Stated Pain Goal: 0 Pain Intervention(s): Medication (See eMAR);Repositioned Multiple Pain Sites: No Home Living/Prior Functioning Home Living Available Help at Discharge: Available 24 hours/day Type of Home: House Home Access: Stairs to enter EC[employer] Energyof Steps: 2 Entrance Stairs-Rails: Can reach both;Left;Right Home Layout: One level Bathroom Shower/Tub: WClinical cytogeneticist Standard Bathroom Accessibility: Yes  Lives With: Alone Prior Function Level of Independence: Independent with basic ADLs Vision/Perception     Cognition Overall Cognitive Status: Within Functional Limits for tasks assessed Arousal/Alertness: Awake/alert Orientation Level: Oriented X4 Attention: Sustained Sustained Attention: Appears intact Selective Attention: Appears intact Memory: Appears intact Sensation Sensation Light Touch: Impaired by gross assessment Peripheral sensation comments: L hand/dorsal surface, lateral forearm Light Touch Impaired Details: Impaired LUE Hot/Cold: Not tested Proprioception: Appears Intact Stereognosis: Not tested Motor  Motor Motor: Within Functional Limits Motor - Skilled Clinical Observations: with exception of L hand/wrist   see OT evaluation for detailed assessment  Mobility Bed Mobility Bed Mobility: Rolling Right;Supine to Sit;Rolling Left;Sitting - Scoot to Edge of Bed;Scooting to HO[facility] Rehabilitation HospitalRight Sidelying to Sit;Sit to Supine;Sit to Sidelying Right Rolling Right: Supervision/verbal cueing(slowly) Rolling Left: Supervision/Verbal cueing Right Sidelying to Sit: (Pt adheres to "move in tube" restrictions, min assist required to complete transition) Supine to Sit: Minimal Assistance - Patient > 75% Sitting - Scoot to Edge of Bed: Contact Guard/Touching assist Sit to Supine: Minimal Assistance - Patient > 75% Sit to Sidelying Right: Minimal Assistance - Patient > 75% Scooting to HOB: Minimal Assistance - Patient > 75% Transfers Transfers: Sit to Stand;Stand to Sit;Stand Pivot Transfers Sit to Stand: Minimal Assistance - Patient > 75% Stand to Sit: Contact Guard/Touching assist Stand Pivot Transfers: Minimal Assistance - Patient > 75% Stand Pivot Transfer Details: Visual cues/gestures for precautions/safety;Verbal cues for technique;Verbal cues for precautions/safety Transfer (Assistive device): None Locomotion   Gait Gait Distance (Feet): 150 Feet Gait Gait: Yes Gait Pattern: Impaired(Pt w/bilat knee arthritis, TKR L, increased lateral trunk excursions, "bowed posture at knees" and tends to maintain mild knee flexion bilat) Gait velocity: decreased Stairs / Additional Locomotion Stairs:  No(unable to tolerate today due to fatigue, needs to increase LE strength to address safely)  Trunk/Postural Assessment  Cervical Assessment Cervical Assessment: Within Functional Limits(baseline forward head/arthritic changes) Thoracic Assessment Thoracic Assessment: Within Functional Limits Lumbar Assessment Lumbar Assessment: Within Functional Limits Postural Control Postural Control: Within Functional Limits  Balance Balance Balance Assessed: Yes Static Sitting Balance Static Sitting - Comment/# of Minutes: 5 Dynamic Sitting Balance Reach (Patient is able to reach ___ inches to right, left, forward, back): 12. limited by back pain but appears to be WNLs Static Standing Balance Static Standing - Level of Assistance: 5: Stand by assistance Static Standing - Comment/# of Minutes: 1 Dynamic Standing Balance Dynamic Standing - Level of Assistance: 4: Min assist Extremity Assessment  RUE Assessment RUE Assessment: Exceptions to WFL(demonstrates full AROM within sternotomy precs) General Strength Comments: sternotomy, PROM Wfls LUE Assessment Passive Range of Motion (PROM) Comments: WFLs Active Range of Motion (AROM) Comments: see OT eval for details General Strength Comments: see OT eval for details, L hand/wrist limitations RLE Assessment RLE Assessment: Within Functional Limits for ROM and general strength testing, but functional weakness demonstrated w/ascending/descending curb at hip and knee LLE Assessment LLE Assessment: Within Functional Limits for ROM and general strength testing, but functional weakness demonstrated w/ascending/descending curb at hip and knee   Refer to Care Plan for Long  Term Goals  Recommendations for other services: None   Discharge Criteria: Patient will be discharged from PT if patient refuses treatment 3 consecutive times without medical reason, if treatment goals not met, if there is a change in medical status, if patient makes no progress towards goals or if patient is discharged from hospital.  The above assessment, treatment plan, treatment alternatives and goals were discussed and mutually agreed upon: by patient  Jerrilyn Cairo 05/01/2019, 3:35 PM

## 2019-05-02 ENCOUNTER — Inpatient Hospital Stay (HOSPITAL_COMMUNITY): Payer: Medicare Other | Admitting: Physical Therapy

## 2019-05-02 ENCOUNTER — Inpatient Hospital Stay (HOSPITAL_COMMUNITY): Payer: Medicare Other

## 2019-05-02 NOTE — Progress Notes (Signed)
Physical Therapy Session Note  Patient Details  Name: Alex [facility] MRN: [id number] Date of Birth: 1950-12-02  Today's Date: 05/02/2019 PT Individual Time: 1400-1410 AND 1615-1710 PT Individual Time Calculation (min): 10 min and 55 min   Short Term Goals: Week 1:  PT Short Term Goal 1 (Week 1): STG=LTG  Skilled Therapeutic Interventions/Progress Updates:  Session 1.  Pt received supine in bed and, reports that he just finished OT, and does not have the energy for PT at this time, but requesting to get out of bed. Supine>sit EOB with supervision assist and min cues for awareness of sternal precautions. Sit>stand from elevated bed height with supervision assist and BUE push from knees. Stand pivot transfer to recliner with RW and supervision assist for safety and min cues for AD management. Pt left sitting in recliner with call bell in reach, alarm set and all needs met.   Session 2.  Pt received supine in bed and agreeable to PT. Supine>sit transfer with supervision assist and cues for safety to maintain sternal precautions.   Stand pivot transfer to [facility] from elevated bed with supervision assist. Pt transported to to rehab gym in [facility].   Gait training with RW 2 x 26f with CGA-supervision assist for safety with cues for pursed lip breathing. SpO2 98-99% and HR 82-85 following each bout.   Standing therex with light UE support on parallel bars  Mini squat x 10  Hip abduction x10 Calf raises. X 10 Reciprocal marches. x10  Throughout therex, vital signs assessed HR 76-87, SpO2 98-99%. Cues for pursed lip breathing, decreased pressure through UE, improved speed of movement, and decreased compensations.    Pt reports need to possible bowel movement. Toilet transfers with RW and supervision assist. Pt able to void bladder, but unable to void bowels. Ambulatory transfer to EOB with supervision assist and RW. Pt left sitting EOB with call bell in reach, alarm set and all needs met.                  Therapy Documentation Precautions:  Precautions Precautions: Sternal, Fall Precaution Comments: reviewed precautions with patient  Restrictions Weight Bearing Restrictions: Yes RUE Partial Weight Bearing Percentage or Pounds: sternal LUE Partial Weight Bearing Percentage or Pounds: sternal General: PT Amount of Missed Time (min): 50 Minutes PT Missed Treatment Reason: Patient fatigue    Therapy/Group: Individual Therapy  ALorie Phenix10/04/2019, 2:46 PM

## 2019-05-02 NOTE — Progress Notes (Signed)
[facility] Progress Note   Subjective:   Seen and examined at bedside.  Just finished PT.  Feeling a little SOB post PT but overall doing well.  Dialysis going as well as possible.  No specific complaints. Denies CP, orthopnea, edema and n/v/d.  Objective Vitals:   05/01/19 1800 05/01/19 1811 05/01/19 1918 05/02/19 0336  BP: (!) 106/58 (!) 110/59 (!) 90/58 (!) 123/55  Pulse: 80 84 84 80  Resp: 20 20 18 20   Temp:  98.2 F (36.8 C) 98.2 F (36.8 C) 98.2 F (36.8 C)  TempSrc:  Oral Oral Oral  SpO2:  99% 97% 100%  Weight:  125.6 kg  127.5 kg   Physical Exam General:NAD, WDWL, obese male Heart:RRR, no mrg Lungs:mostly CTAB Abdomen:soft, NTND Extremities:1+ LE edema b/l  Dialysis Access: R IJ TDC dressed, R AVF +t   Filed Weights   05/01/19 1420 05/01/19 1811 05/02/19 0336  Weight: 126.3 kg 125.6 kg 127.5 kg    Intake/Output Summary (Last 24 hours) at 05/02/2019 1434 Last data filed at 05/02/2019 0816 Gross per 24 hour  Intake 120 ml  Output 700 ml  Net -580 ml    Additional Objective Labs: Basic Metabolic Panel: Recent Labs  Lab 04/26/19 1706 04/27/19 0315 04/29/19 1933 04/30/19 0450  NA 135 136 137 138  K 4.6 4.6 4.2 4.2  CL 99 101 101 100  CO2 23 23 23 26   GLUCOSE 93 95 108* 84  BUN 64* 69* 64* 27*  CREATININE 6.88* 7.58* 8.31* 4.85*  CALCIUM 12.7* 12.5* 11.3* 9.9  PHOS 6.9* 6.9* 7.0*  --    Liver Function Tests: Recent Labs  Lab 04/26/19 0429 04/26/19 1706 04/27/19 0315 04/29/19 1933  AST 22  --   --   --   ALT 31  --   --   --   ALKPHOS 79  --   --   --   BILITOT 0.9  --   --   --   PROT 5.0*  --   --   --   ALBUMIN 2.2* 2.2* 2.1* 2.6*   CBC: Recent Labs  Lab 04/26/19 0429 04/27/19 0314 04/28/19 0354 04/29/19 1640 04/30/19 0450  WBC 13.0* 12.2* 17.9* 12.6* 12.3*  NEUTROABS 8.2* 8.5* 12.3*  --  8.6*  HGB 7.5* 7.4* 8.9* 7.4* 7.3*  HCT 24.4* 23.5* 29.3* 23.4* 24.2*  MCV 92.4 91.4 92.4 92.5 93.8  PLT 293 285 402* 300  297    Medications: . ferric gluconate (FERRLECIT/NULECIT) IV     . allopurinol  100 mg Oral Daily  . amiodarone  200 mg Oral BID  . apixaban  5 mg Oral BID  . aspirin EC  81 mg Oral Daily  . B-complex with vitamin C  1 tablet Oral Daily  . bisacodyl  10 mg Oral Daily   Or  . bisacodyl  10 mg Rectal Daily  . bisoprolol  2.5 mg Oral Once per day on Sun Tue Thu Sat  . Chlorhexidine Gluconate Cloth  6 each Topical Daily  . cinacalcet  30 mg Oral Q supper  . [START ON 05/04/2019] darbepoetin (ARANESP) injection - DIALYSIS  60 mcg Intravenous Q Mon-HD  . diclofenac sodium  2 g Topical QID  . feeding supplement (PRO-STAT SUGAR FREE 64)  30 mL Oral BID  . fluticasone  2 spray Each Nare Daily  . heparin  1,000 Units Intravenous Q M,W,F-HD  . midodrine  5 mg Oral Q M,W,F-HD  . pantoprazole  40  mg Oral Daily  . psyllium  1 packet Oral BID  . rosuvastatin  10 mg Oral q1800  . sevelamer carbonate  800 mg Oral TID WC    Dialysis Orders: [place] MWF - new start, had OP HD on 9/21 and 9/23 prior to admission 3h 132kg 300/600 RUE AVF 2/2 bath Hep none venofer 100 mg ordered each treatment x 4,Hb 8.5 on 9/21 noESA order yet PTH 1366 on 04/15/19  Assessment/Plan: 1. Cardiogenic shock/STEMI - s/p emergent CABG on 9/24.  S/p impella removed 04/19/19.  Per HF team & CTS.   2. ESRD - on HD MWF.  New start w/2OP HD prior to admission.  CRRT from 9/25-10/2.  OP staff reported difficultly w/AVF cannulation, TDC placed by [facility] on 10/7.  OP fistulogram scheduled in 2 weeks. Next HD 10/12. 3. Anemia of CKD & ABLA - transfuse prn.  Hgb 7.3, aranesp 16mcg qwk ordered w/HD Monday.  tsat 14%, iron course ordered to start 10/12.  4. Secondary hyperparathyroidism - Ca 9.9, corrected~11 phos 7, OP pth 1366 on 9/23. Low Ca bath, Renvela 800mg  TID & sensipar 30mg  qd started on 10/7, may need OP parathyroid scan d/t high Ca & high pth.  Will need weekly Ca rechecks. 5. Hypotension/volume - BP currently  well controlled. Midodrine titrated down now to just 5mg  pre HD.  Tolerating so far. Needs to have midodrine pre HD for ultrafiltration.  Continue to titrate down volume as tolerated.  Low dose Bisoprolol started today.  6. CAD s/p CABG x3 on 9/24, ASA 81mg  qd 7. Afib on PO amio & eliquis 8. ID - s/p empiric IV ABX stopped on 10/2. WBC improved to 12.3.  9. Possible urinary retention - bladder scans, I/O cath if necessary 10: Dispo: in rehab  Jen Mow, [facility] Pager: [phone] 05/02/2019,2:34 PM  LOS: 2 days

## 2019-05-02 NOTE — IPOC Note (Signed)
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     Functional Problem List: Nursing Edema, Endurance, Medication Management, Nutrition, Safety  PT Balance, Sensory, Skin Integrity, Motor, Endurance  OT Balance, Endurance, Motor, Pain  SLP    TR         Basic ADL's: OT Grooming, Bathing, Dressing, Toileting     Advanced  ADL's: OT       Transfers: PT Bed Mobility, Bed to Chair, Car, Chief Operating Officer: PT Ambulation, Emergency planning/management officer, Stairs     Additional Impairments: OT Fuctional Use of Upper Extremity  SLP        TR      Anticipated Outcomes Item Anticipated Outcome  Self Feeding independent  Swallowing      Basic self-care  modified independent  Toileting  modified independent level   Bathroom Transfers modified independent level  Bowel/Bladder  IP  Transfers  mod I  Locomotion  mod I  Communication     Cognition     Pain  Patients pain will be managed while on IP rehab  Safety/Judgment  Patient will have no falls with injury while on IP rehab   Therapy Plan: PT Intensity: Minimum of 1-2 x/day ,45 to 90 minutes PT Frequency: 5 out of 7 days PT Duration Estimated Length of Stay: 7days OT Intensity: Minimum of 1-2 x/day, 45 to 90 minutes OT Frequency: 5 out of 7 days OT Duration/Estimated Length of Stay: 5-7 days     Due to the current state of emergency, patients may not be receiving their 3-hours of Medicare-mandated therapy.   Team  Interventions: Nursing Interventions Patient/Family Education, Pain Management, Bowel Management, Bladder Management, Medication Management  PT interventions Ambulation/gait training, Stair training, UE/LE Strength taining/ROM, Wheelchair propulsion/positioning, Discharge planning, Therapeutic Activities, UE/LE Coordination activities, Functional mobility training, Patient/family education, Therapeutic Exercise, DME/adaptive equipment instruction  OT Interventions Balance/vestibular training, Patient/family education, Self Care/advanced ADL retraining, Discharge planning, Therapeutic Activities, UE/LE Coordination activities, DME/adaptive equipment instruction, Functional mobility training, Therapeutic Exercise, Neuromuscular re-education, Community reintegration, UE/LE Strength taining/ROM  SLP Interventions    TR Interventions    SW/CM Interventions Discharge Planning, Psychosocial Support, Patient/Family Education   Barriers to Discharge MD  Medical stability  Nursing Hemodialysis new hemodialysis patient  PT Home environment access/layout, Weight bearing restrictions, Weight 3 stepst to enter w/rails, sternotomy precs  OT      SLP      SW       Team Discharge Planning: Destination: PT-Home ,OT- Home , SLP-  Projected Follow-up: PT-Other (comment)(to be further assessed), OT-  Home health OT, SLP-  Projected Equipment Needs: PT- , OT- To be determined, SLP-  Equipment Details: PT-to be determined as pt progresses in rx, OT-  Patient/family involved in discharge planning: PT- Patient,  OT- , SLP-   MD ELOS: 7-10d Medical Rehab Prognosis:  Good Assessment:  68 year old right-handed male with history of chronic kidney disease with hemodialysis, systolic congestive heart failure, hyperlipidemia, hypertension, low back pain.  History taken from chart  review and patient.  Patient lives alone and was independent PTA. 1 level home 3 steps to entry.  Plans to stay with his sister and  assistance as needed on discharge.  Presented 04/16/2019 with chest pain and palpitations.  Echocardiogram performed which showed an ejection fraction of approximately 25%.  Suspect cardiogenic shock in setting of atypical atrial flutter with RVR and ischemia.  Cardiac catheterization 90% distal left main stenosis into the LCx, totally occluded mid LAD.  Initial attempts at angioplasty but unable to restore flow.  Patient underwent emergent medius sternotomy and CABG x3 on 04/16/2019 with Dr. Kipp Brood.  Hospital course complicated by pain management.  Hemodialysis ongoing after initially receiving CRRT and patient did receive a tunneled dialysis catheter 04/29/2019 per Dr. Donnetta Hutching..  Acute on chronic anemia 7.4 patient did receive 1 unit packed red blood cells 04/27/2019.  Maintained on Eliquis as well as low-dose aspirin.  Cardiac rate remained controlled patient continues on amiodarone follow-up per cardiology services.  Close monitoring of blood pressure continues on ProAmatine Monday Wednesday Fridays   Now requiring 24/7 Rehab RN,MD, as well as CIR level PT, OT and SLP.  Treatment team will focus on ADLs and mobility with goals set at Mod I    See Team Conference Notes for weekly updates to the plan of care

## 2019-05-02 NOTE — Progress Notes (Signed)
Occupational Therapy Session Note  Patient Details  Name: ELON EOFF MRN: [id number] Date of Birth: Nov 05, 1950  Today's Date: 05/02/2019 OT Individual Time: 1000-1042 OT Individual Time Calculation (min): 42 min    Short Term Goals: Week 1:  OT Short Term Goal 1 (Week 1): STGs equal to LTGs set at overall modified independent level overall.  Skilled Therapeutic Interventions/Progress Updates:    1:1. Pt received in recliner with 3/10 pain declining intervention. Pt bathes at sink with A to wash back and BLE. Pt grooms at sink with S and stands at sink with MIN A to wash buttocks and peri area. Pt dresses UB/LB with MIN A. Pt able to maintain sternal precautions well requiring MIN-MOD A for standing from varying height surfaces with no UE press up. Pt able to don non skid socks seated EOB with lifting 1LE up on side of bed. Exited session with pt seated in recliner, call light in reach and all needs met.  Therapy Documentation Precautions:  Precautions Precautions: Sternal, Fall Precaution Comments: reviewed precautions with patient  Restrictions Weight Bearing Restrictions: Yes RUE Weight Bearing: Non weight bearing RUE Partial Weight Bearing Percentage or Pounds: sternal LUE Weight Bearing: Non weight bearing LUE Partial Weight Bearing Percentage or Pounds: sternal General:   Vital Signs:  Pain: Pain Assessment Pain Scale: 0-10 Pain Score: 0-No pain ADL: ADL Eating: Independent Where Assessed-Eating: Wheelchair Grooming: Minimal assistance Where Assessed-Grooming: Edge of bed Upper Body Bathing: Supervision/safety Where Assessed-Upper Body Bathing: Edge of bed Lower Body Bathing: Minimal assistance Where Assessed-Lower Body Bathing: Edge of bed Upper Body Dressing: Supervision/safety Where Assessed-Upper Body Dressing: Edge of bed Lower Body Dressing: Minimal assistance Where Assessed-Lower Body Dressing: Edge of bed Toileting: Minimal assistance Where  Assessed-Toileting: Glass blower/designer: Psychiatric nurse Method: Counselling psychologist: Raised toilet seat Vision   Perception    Praxis   Exercises:   Other Treatments:     Therapy/Group: Individual Therapy  Tonny Branch 05/02/2019, 10:18 AM

## 2019-05-02 NOTE — Progress Notes (Signed)
Occupational Therapy Session Note  Patient Details  Name: Alex Williams MRN: [id number] Date of Birth: 23-Feb-1951  Today's Date: 05/02/2019 OT Individual Time: 1300-1325 OT Individual Time Calculation (min): 25 min    Short Term Goals: Week 1:  OT Short Term Goal 1 (Week 1): STGs equal to LTGs set at overall modified independent level overall.  Skilled Therapeutic Interventions/Progress Updates:    1;1. Pt received on toilet with NT present. Pt finishes urine void on toilet and transfers to EOB with CGA and use of RW. Pt changes out of paper clothing into standard clothes brought by daughter with CGA for LB clothing management and set up for UB. Pt recalls hint to thread RLE first from previous session with min question cues. Pt washes, dries and combs hair with shower cap and RUE. Pt completes 34' of functional moblity in hallway for endurance training with CGA and Vc for looking ahead instead of at floor. Exied session with pt seated in w/c, call light in reach and all needs met  Therapy Documentation Precautions:  Precautions Precautions: Sternal, Fall Precaution Comments: reviewed precautions with patient  Restrictions Weight Bearing Restrictions: Yes RUE Weight Bearing: Non weight bearing RUE Partial Weight Bearing Percentage or Pounds: sternal LUE Weight Bearing: Non weight bearing LUE Partial Weight Bearing Percentage or Pounds: sternal General:   Vital Signs:  Pain:   ADL: ADL Eating: Independent Where Assessed-Eating: Wheelchair Grooming: Minimal assistance Where Assessed-Grooming: Edge of bed Upper Body Bathing: Supervision/safety Where Assessed-Upper Body Bathing: Edge of bed Lower Body Bathing: Minimal assistance Where Assessed-Lower Body Bathing: Edge of bed Upper Body Dressing: Supervision/safety Where Assessed-Upper Body Dressing: Edge of bed Lower Body Dressing: Minimal assistance Where Assessed-Lower Body Dressing: Edge of bed Toileting: Minimal  assistance Where Assessed-Toileting: Glass blower/designer: Psychiatric nurse Method: Counselling psychologist: Raised toilet seat Vision   Perception    Praxis   Exercises:   Other Treatments:     Therapy/Group: Individual Therapy  Tonny Branch 05/02/2019, 1:26 PM

## 2019-05-02 NOTE — Progress Notes (Signed)
[place] PHYSICAL MEDICINE & REHABILITATION PROGRESS NOTE   Subjective/Complaints:  Occasional cough Numbness in the left fourth and fifth digits  Review of systems negative for chest pain except for with cough negative for shortness of breath negative for nausea vomiting diarrhea no constipation    Objective:   No results found. Recent Labs    04/29/19 1640 04/30/19 0450  WBC 12.6* 12.3*  HGB 7.4* 7.3*  HCT 23.4* 24.2*  PLT 300 297   Recent Labs    04/29/19 1933 04/30/19 0450  NA 137 138  K 4.2 4.2  CL 101 100  CO2 23 26  GLUCOSE 108* 84  BUN 64* 27*  CREATININE 8.31* 4.85*  CALCIUM 11.3* 9.9    Intake/Output Summary (Last 24 hours) at 05/02/2019 1126 Last data filed at 05/02/2019 0816 Gross per 24 hour  Intake 120 ml  Output 700 ml  Net -580 ml     Physical Exam: Vital Signs Blood pressure (!) 123/55, pulse 80, temperature 98.2 F (36.8 C), temperature source Oral, resp. rate 20, weight 127.5 kg, SpO2 100 %.  Physical Exam  Vitals and labs reviewed. Constitutional: pt awake, alert, appropriate, sitting on EOB in room; PT at bedside, NAD HENT:  Head: Normocephalic and atraumatic.  Eyes: conjugate gaze.  Neck: No tracheal deviation present. No thyromegaly present CV- RRR with an occ extra beat heard- quiet heart sounds .  Respiratory:  Has fair to good air movement with a few crackles heard in bases B/L- no wheezing; intermittent hacking juicy sounding cough- not productive GI: soft, NT, ND, (+BS Musculoskeletal:     Comments: Edema and bilateral lower extremities and left upper extremity- L hand esp is swollen  TTP over R knee esp over patella L hand- limited extension of fingers Neurological: He is alert.  Follows full commands.   Cooperative with exam. Motor: Right upper extremity: 5/5 proximal distal Left upper extremity: prox 5/5; hand 2-4/5 - mainly lacking extension Bilateral lower extremities: 4--4/5 proximal distal Sensation  diminished to light touch left hand  Esp in ulnar distribution, however also mildly affected in median distribution, just not to same extent as ulnar.  Skin:  Midline chest incision C/D/I  Psychiatric: full affect    Assessment/Plan: 1. Functional deficits secondary to debility/ NSTEMI and CABG x 3 vessels and new ESRD/with HD which require 3+ hours per day of interdisciplinary therapy in a comprehensive inpatient rehab setting.  Physiatrist is providing close team supervision and 24 hour management of active medical problems listed below.  Physiatrist and rehab team continue to assess barriers to discharge/monitor patient progress toward functional and medical goals  Care Tool:  Bathing    Body parts bathed by patient: Right arm, Left arm, Chest, Abdomen, Front perineal area, Buttocks, Right upper leg, Face, Left lower leg, Right lower leg, Left upper leg         Bathing assist Assist Level: Minimal Assistance - Patient > 75%     Upper Body Dressing/Undressing Upper body dressing   What is the patient wearing?: Pull over shirt    Upper body assist Assist Level: Supervision/Verbal cueing    Lower Body Dressing/Undressing Lower body dressing      What is the patient wearing?: Pants     Lower body assist Assist for lower body dressing: Minimal Assistance - Patient > 75%     Toileting Toileting    Toileting assist Assist for toileting: Moderate Assistance - Patient 50 - 74% Assistive Device Comment: (walker)   Transfers  Chair/bed transfer  Transfers assist     Chair/bed transfer assist level: Supervision/Verbal cueing     Locomotion Ambulation   Ambulation assist      Assist level: Minimal Assistance - Patient > 75% Assistive device: Walker-rolling Max distance: 10'   Walk 10 feet activity   Assist     Assist level: Contact Guard/Touching assist Assistive device: Walker-rolling   Walk 50 feet activity   Assist    Assist level: Contact  Guard/Touching assist Assistive device: Walker-rolling    Walk 150 feet activity   Assist    Assist level: Contact Guard/Touching assist Assistive device: Walker-rolling    Walk 10 feet on uneven surface  activity   Assist           Wheelchair     Assist Will patient use wheelchair at discharge?: No      Wheelchair assist level: Maximal Assistance - Patient 25 - 49%(attempted w/LEs only due to sternal precs.) Max wheelchair distance: 70ft    Wheelchair 50 feet with 2 turns activity    Assist    Wheelchair 50 feet with 2 turns activity did not occur: Safety/medical concerns       Wheelchair 150 feet activity     Assist  Wheelchair 150 feet activity did not occur: Safety/medical concerns       Blood pressure (!) 123/55, pulse 80, temperature 98.2 F (36.8 C), temperature source Oral, resp. rate 20, weight 127.5 kg, SpO2 100 %.  Medical Problem List and Plan: 1.  Debility secondary to non-STEMI status post CABG x3 04/16/2019.  Sternal precautions                 CIR PT OT evaluations  10/9- pt also has peripheral neuropathy, maybe due to OR positioning of LUE- will likely need EMG but cannot be done for 3+ weeks after nerve compression 2.  Antithrombotics: -DVT/anticoagulation: Eliquis             -antiplatelet therapy: Aspirin 81 mg daily 3. Pain Management: Oxycodone/Ultram as needed  10/9- will order voltaren gel for R knee QID 2g 4. Mood: Provide emotional support             -antipsychotic agents: N/A 5. Neuropsych: This patient is capable of making decisions on his own behalf. 6. Skin/Wound Care: Routine skin checks 7. Fluids/Electrolytes/Nutrition: Routine in and outs.  CMP ordered for tomorrow a.m.  10/9- electrolytes look great/stable 8.  Acute on chronic anemia.  CBC ordered for tomorrow a.m.  Continue Aranesp  10/9- Hb stable at 7.3 (was 7.4) 9.  Atrial fibrillation with RVR.  Amiodarone 200 mg twice daily.  Cardiac rate controlled              Monitor with increased physical exertion. 10.  Hypotension.  ProAmatine 5 mg Monday Wednesday Friday            Vitals:   05/01/19 1918 05/02/19 0336  BP: (!) 90/58 (!) 123/55  Pulse: 84 80  Resp: 18 20  Temp: 98.2 F (36.8 C) 98.2 F (36.8 C)  SpO2: 97% 100%  Improved this a.m., may have hemodialysis related hypotension, renal to manage 11.  ESRD on hemodialysis.  Status post tunneled catheter 04/29/2019- no shower/bed baths with catheter.  12.  Hyperlipidemia.  Crestor 13.  Constipation with hemorrhoids.  Laxative assistance.  Continue Tucks pads 14. LUE nerve compression- will need EMG after Rehab D/c. 15. Edema- will see if OT can get compression glove for edema control or kineseotaping.  16. Leukocytosis- afebrile and no signs of infection- WBC 12.3 down from 12.6- will recheck Monday or earlier if signs of infection.  LOS: 2 days A FACE TO [place] E  05/02/2019, 11:26 AM

## 2019-05-03 LAB — HEMOGLOBIN AND HEMATOCRIT, BLOOD
HCT: 22.6 % — ABNORMAL LOW (ref 39.0–52.0)
Hemoglobin: 7 g/dL — ABNORMAL LOW (ref 13.0–17.0)

## 2019-05-03 MED ORDER — CHLORHEXIDINE GLUCONATE CLOTH 2 % EX PADS
6.0000 | MEDICATED_PAD | Freq: Every day | CUTANEOUS | Status: DC
  Administered 2019-05-04 – 2019-05-09 (×4): 6 via TOPICAL

## 2019-05-03 NOTE — Progress Notes (Signed)
[place] PHYSICAL MEDICINE & REHABILITATION PROGRESS NOTE   Subjective/Complaints:  No issues overnight.  Has had some incisional chest pain intermittently  Review of systems negative for chest pain except for with cough negative for shortness of breath negative for nausea vomiting diarrhea no constipation    Objective:   No results found. No results for input(s): WBC, HGB, HCT, PLT in the last 72 hours. No results for input(s): NA, K, CL, CO2, GLUCOSE, BUN, CREATININE, CALCIUM in the last 72 hours.  Intake/Output Summary (Last 24 hours) at 05/03/2019 1121 Last data filed at 05/03/2019 0900 Gross per 24 hour  Intake 200 ml  Output -  Net 200 ml     Physical Exam: Vital Signs Blood pressure 111/68, pulse 72, temperature 97.8 F (36.6 C), temperature source Oral, resp. rate 16, weight 127.7 kg, SpO2 100 %.  Physical Exam  Vitals and labs reviewed. Constitutional: pt awake, alert, appropriate, sitting on EOB in room; PT at bedside, NAD HENT:  Head: Normocephalic and atraumatic.  Eyes: conjugate gaze.  Neck: No tracheal deviation present. No thyromegaly present CV- RRR with an occ extra beat heard- quiet heart sounds .  Respiratory:  Has fair to good air movement with a few crackles heard in bases B/L- no wheezing; intermittent hacking juicy sounding cough- not productive GI: soft, NT, ND, (+BS Musculoskeletal:     Comments: Edema and bilateral lower extremities and left upper extremity- L hand esp is swollen  TTP over R knee esp over patella L hand- limited extension of fingers Neurological: He is alert.  Follows full commands.   Cooperative with exam. Motor: Right upper extremity: 5/5 proximal distal Left upper extremity: prox 5/5; hand 2-4/5 - mainly lacking extension Bilateral lower extremities: 4--4/5 proximal distal Sensation diminished to light touch left hand  Esp in ulnar distribution, however also mildly affected in median distribution, just not to same  extent as ulnar.  Skin:  Midline chest incision C/D/I , no tenderness along the incision Psychiatric: full affect    Assessment/Plan: 1. Functional deficits secondary to debility/ NSTEMI and CABG x 3 vessels and new ESRD/with HD which require 3+ hours per day of interdisciplinary therapy in a comprehensive inpatient rehab setting.  Physiatrist is providing close team supervision and 24 hour management of active medical problems listed below.  Physiatrist and rehab team continue to assess barriers to discharge/monitor patient progress toward functional and medical goals  Care Tool:  Bathing    Body parts bathed by patient: Right arm, Left arm, Chest, Abdomen, Front perineal area, Buttocks, Right upper leg, Face, Left lower leg, Right lower leg, Left upper leg         Bathing assist Assist Level: Minimal Assistance - Patient > 75%     Upper Body Dressing/Undressing Upper body dressing   What is the patient wearing?: Hospital gown only    Upper body assist Assist Level: Minimal Assistance - Patient > 75%    Lower Body Dressing/Undressing Lower body dressing      What is the patient wearing?: Pants     Lower body assist Assist for lower body dressing: Minimal Assistance - Patient > 75%     Toileting Toileting    Toileting assist Assist for toileting: Minimal Assistance - Patient > 75% Assistive Device Comment: (walker)   Transfers Chair/bed transfer  Transfers assist     Chair/bed transfer assist level: Supervision/Verbal cueing     Locomotion Ambulation   Ambulation assist      Assist level: Contact  Guard/Touching assist Assistive device: Walker-rolling Max distance: 70   Walk 10 feet activity   Assist     Assist level: Contact Guard/Touching assist Assistive device: Walker-rolling   Walk 50 feet activity   Assist    Assist level: Contact Guard/Touching assist Assistive device: Walker-rolling    Walk 150 feet activity   Assist     Assist level: Contact Guard/Touching assist Assistive device: Walker-rolling    Walk 10 feet on uneven surface  activity   Assist           Wheelchair     Assist Will patient use wheelchair at discharge?: No      Wheelchair assist level: Maximal Assistance - Patient 25 - 49%(attempted w/LEs only due to sternal precs.) Max wheelchair distance: 67ft    Wheelchair 50 feet with 2 turns activity    Assist    Wheelchair 50 feet with 2 turns activity did not occur: Safety/medical concerns       Wheelchair 150 feet activity     Assist  Wheelchair 150 feet activity did not occur: Safety/medical concerns       Blood pressure 111/68, pulse 72, temperature 97.8 F (36.6 C), temperature source Oral, resp. rate 16, weight 127.7 kg, SpO2 100 %.  Medical Problem List and Plan: 1.  Debility secondary to non-STEMI status post CABG x3 04/16/2019.  Sternal precautions                 CIR PT OT evaluations  10/9- pt also has peripheral neuropathy, maybe due to OR positioning of LUE- will likely need EMG, this can be done as outpatient 2.  Antithrombotics: -DVT/anticoagulation: Eliquis             -antiplatelet therapy: Aspirin 81 mg daily 3. Pain Management: Oxycodone/Ultram as needed  10/9- will order voltaren gel for R knee QID 2g 4. Mood: Provide emotional support             -antipsychotic agents: N/A 5. Neuropsych: This patient is capable of making decisions on his own behalf. 6. Skin/Wound Care: Routine skin checks 7. Fluids/Electrolytes/Nutrition: Routine in and outs.  CMP ordered for tomorrow a.m.  10/9- electrolytes look great/stable 8.  Acute on chronic anemia.  CBC ordered for tomorrow a.m.  Continue Aranesp  10/9- Hb stable at 7.3 (was 7.4) 9.  Atrial fibrillation with RVR.  Amiodarone 200 mg twice daily.  Cardiac rate controlled             Monitor with increased physical exertion. 10.  Hypotension.  ProAmatine 5 mg Monday Wednesday Friday             Vitals:   05/03/19 0334 05/03/19 0732  BP: (!) 110/51 111/68  Pulse: 76 72  Resp: 18 16  Temp: 98.1 F (36.7 C) 97.8 F (36.6 C)  SpO2: 97% 100%  Improved, 10/11 11.  ESRD on hemodialysis.  Status post tunneled catheter 04/29/2019- no shower/bed baths with catheter.  12.  Hyperlipidemia.  Crestor 13.  Constipation with hemorrhoids.  Laxative assistance.  Continue Tucks pads 14. LUE nerve compression- will need EMG after Rehab D/c. 15. Edema- will see if OT can get compression glove for edema control or kineseotaping.  16. Leukocytosis- afebrile and no signs of infection- WBC 12.3 down from 12.6- will recheck Monday or earlier if signs of infection.  LOS: 3 days A FACE TO [place] 05/03/2019, 11:21 AM

## 2019-05-03 NOTE — Progress Notes (Signed)
[facility] Progress Note   Subjective:   Patient seen and examined at bedside. Reports intermittent CP around his incision.  Denies SOB, CP, edema, n/v/d and dizziness.   Objective Vitals:   05/02/19 1517 05/02/19 2049 05/03/19 0334 05/03/19 0732  BP: 115/87 (!) 124/54 (!) 110/51 111/68  Pulse: 72 77 76 72  Resp: 16 18 18 16   Temp: 98.2 F (36.8 C) 98 F (36.7 C) 98.1 F (36.7 C) 97.8 F (36.6 C)  TempSrc: Oral Oral Oral Oral  SpO2: 98% 94% 97% 100%  Weight:   127.7 kg    Physical Exam General:NAD, chronically ill appearing male, laying in bed Heart:RRR, 2/6 systolic murmur, midline incision on chest Lungs: mostly CTAB Abdomen:soft, NTND Extremities:1+ LE edema Dialysis Access: R IJ TDC,  RU AVF +t  Filed Weights   05/01/19 1811 05/02/19 0336 05/03/19 0334  Weight: 125.6 kg 127.5 kg 127.7 kg    Intake/Output Summary (Last 24 hours) at 05/03/2019 1309 Last data filed at 05/03/2019 1148 Gross per 24 hour  Intake 200 ml  Output 1 ml  Net 199 ml    Additional Objective Labs: Basic Metabolic Panel: Recent Labs  Lab 04/26/19 1706 04/27/19 0315 04/29/19 1933 04/30/19 0450  NA 135 136 137 138  K 4.6 4.6 4.2 4.2  CL 99 101 101 100  CO2 23 23 23 26   GLUCOSE 93 95 108* 84  BUN 64* 69* 64* 27*  CREATININE 6.88* 7.58* 8.31* 4.85*  CALCIUM 12.7* 12.5* 11.3* 9.9  PHOS 6.9* 6.9* 7.0*  --    Liver Function Tests: Recent Labs  Lab 04/26/19 1706 04/27/19 0315 04/29/19 1933  ALBUMIN 2.2* 2.1* 2.6*  CBC: Recent Labs  Lab 04/27/19 0314 04/28/19 0354 04/29/19 1640 04/30/19 0450 05/03/19 1051  WBC 12.2* 17.9* 12.6* 12.3*  --   NEUTROABS 8.5* 12.3*  --  8.6*  --   HGB 7.4* 8.9* 7.4* 7.3* 7.0*  HCT 23.5* 29.3* 23.4* 24.2* 22.6*  MCV 91.4 92.4 92.5 93.8  --   PLT 285 402* 300 297  --     Medications: . ferric gluconate (FERRLECIT/NULECIT) IV     . allopurinol  100 mg Oral Daily  . amiodarone  200 mg Oral BID  . apixaban  5 mg Oral BID  .  aspirin EC  81 mg Oral Daily  . B-complex with vitamin C  1 tablet Oral Daily  . bisacodyl  10 mg Oral Daily   Or  . bisacodyl  10 mg Rectal Daily  . bisoprolol  2.5 mg Oral Once per day on Sun Tue Thu Sat  . Chlorhexidine Gluconate Cloth  6 each Topical Daily  . cinacalcet  30 mg Oral Q supper  . [START ON 05/04/2019] darbepoetin (ARANESP) injection - DIALYSIS  60 mcg Intravenous Q Mon-HD  . diclofenac sodium  2 g Topical QID  . feeding supplement (PRO-STAT SUGAR FREE 64)  30 mL Oral BID  . fluticasone  2 spray Each Nare Daily  . heparin  1,000 Units Intravenous Q M,W,F-HD  . midodrine  5 mg Oral Q M,W,F-HD  . pantoprazole  40 mg Oral Daily  . psyllium  1 packet Oral BID  . rosuvastatin  10 mg Oral q1800  . sevelamer carbonate  800 mg Oral TID WC    Dialysis Orders: [facility] MWF - new start, had OP HD on 9/21 and 9/23 prior to admission 3h 132kg 300/600 RUE AVF 2/2 bath Hep none venofer 100 mg ordered each treatment x  4,Hb 8.5 on 9/21 noESA order yet PTH 1366 on 04/15/19  Assessment/Plan: 1. Cardiogenic shock/STEMI - s/p emergent CABG on 9/24.  S/p impella removed 04/19/19.  Per HF team & CTS.   2. ESRD - on HD MWF.  New start w/2OP HD prior to admission.  CRRT from 9/25-10/2.  OP staff reported difficultly w/AVF cannulation, TDC placed by [facility] on 10/7.  OP fistulogram scheduled in 2 weeks w/ [facility]. Next HD 10/12. 3. Anemia of CKD & ABLA - transfuse prn.  Hgb 7.0, aranesp 95mcg qwk ordered w/HD Monday.  tsat 14%, iron course ordered to start 10/12.  4. Secondary hyperparathyroidism - Ca 9.9, corrected~11 phos 7, OP pth 1366 on 9/23. Low Ca bath, Renvela 800mg  TID & sensipar 30mg  qd started on 10/7, may need OP parathyroid scan d/t high Ca & high pth.  Will need weekly Ca rechecks. 5. Hypotension/volume - BP currently well controlled. Midodrine titrated down now to just 5mg  pre HD.  Tolerating so far. Needs to have midodrine pre HD for ultrafiltration.  Continue to titrate down  volume as tolerated.  Low dose Bisoprolol started.  6. CAD s/p CABG x3 on 9/24, ASA 81mg  qd 7. Afib on PO amio & eliquis 8. ID - s/p empiric IV ABX stopped on 10/2. WBC improved to 12.3.  9. Possible urinary retention - bladder scans, I/O cath if necessary 10: Dispo: in rehab  Jen Mow, PA-C [facility] Pager: [phone] 05/03/2019,1:09 PM  LOS: 3 days

## 2019-05-04 ENCOUNTER — Inpatient Hospital Stay (HOSPITAL_COMMUNITY): Payer: Medicare Other | Admitting: Occupational Therapy

## 2019-05-04 ENCOUNTER — Inpatient Hospital Stay (HOSPITAL_COMMUNITY): Payer: Medicare Other

## 2019-05-04 LAB — CBC
HCT: 21.9 % — ABNORMAL LOW (ref 39.0–52.0)
Hemoglobin: 6.9 g/dL — CL (ref 13.0–17.0)
MCH: 29 pg (ref 26.0–34.0)
MCHC: 31.5 g/dL (ref 30.0–36.0)
MCV: 92 fL (ref 80.0–100.0)
Platelets: 224 10*3/uL (ref 150–400)
RBC: 2.38 MIL/uL — ABNORMAL LOW (ref 4.22–5.81)
RDW: 16.6 % — ABNORMAL HIGH (ref 11.5–15.5)
WBC: 7.3 10*3/uL (ref 4.0–10.5)
nRBC: 0 % (ref 0.0–0.2)

## 2019-05-04 LAB — RENAL FUNCTION PANEL
Albumin: 2.4 g/dL — ABNORMAL LOW (ref 3.5–5.0)
Anion gap: 13 (ref 5–15)
BUN: 56 mg/dL — ABNORMAL HIGH (ref 8–23)
CO2: 25 mmol/L (ref 22–32)
Calcium: 9.7 mg/dL (ref 8.9–10.3)
Chloride: 96 mmol/L — ABNORMAL LOW (ref 98–111)
Creatinine, Ser: 8.57 mg/dL — ABNORMAL HIGH (ref 0.61–1.24)
GFR calc Af Amer: 7 mL/min — ABNORMAL LOW (ref >60)
GFR calc non Af Amer: 6 mL/min — ABNORMAL LOW (ref >60)
Glucose, Bld: 112 mg/dL — ABNORMAL HIGH (ref 70–99)
Phosphorus: 4.6 mg/dL (ref 2.5–4.6)
Potassium: 4.3 mmol/L (ref 3.5–5.1)
Sodium: 134 mmol/L — ABNORMAL LOW (ref 135–145)

## 2019-05-04 LAB — CBC WITH DIFFERENTIAL/PLATELET
Abs Immature Granulocytes: 0.04 10*3/uL (ref 0.00–0.07)
Basophils Absolute: 0 10*3/uL (ref 0.0–0.1)
Basophils Relative: 1 %
Eosinophils Absolute: 0.2 10*3/uL (ref 0.0–0.5)
Eosinophils Relative: 3 %
HCT: 20.4 % — ABNORMAL LOW (ref 39.0–52.0)
Hemoglobin: 6.4 g/dL — CL (ref 13.0–17.0)
Immature Granulocytes: 1 %
Lymphocytes Relative: 25 %
Lymphs Abs: 1.7 10*3/uL (ref 0.7–4.0)
MCH: 29.1 pg (ref 26.0–34.0)
MCHC: 31.4 g/dL (ref 30.0–36.0)
MCV: 92.7 fL (ref 80.0–100.0)
Monocytes Absolute: 0.6 10*3/uL (ref 0.1–1.0)
Monocytes Relative: 9 %
Neutro Abs: 4.3 10*3/uL (ref 1.7–7.7)
Neutrophils Relative %: 61 %
Platelets: 210 10*3/uL (ref 150–400)
RBC: 2.2 MIL/uL — ABNORMAL LOW (ref 4.22–5.81)
RDW: 16.7 % — ABNORMAL HIGH (ref 11.5–15.5)
WBC: 6.9 10*3/uL (ref 4.0–10.5)
nRBC: 0 % (ref 0.0–0.2)

## 2019-05-04 LAB — PREPARE RBC (CROSSMATCH)

## 2019-05-04 LAB — COMPREHENSIVE METABOLIC PANEL
ALT: 19 U/L (ref 0–44)
AST: 23 U/L (ref 15–41)
Albumin: 2.2 g/dL — ABNORMAL LOW (ref 3.5–5.0)
Alkaline Phosphatase: 94 U/L (ref 38–126)
Anion gap: 11 (ref 5–15)
BUN: 51 mg/dL — ABNORMAL HIGH (ref 8–23)
CO2: 27 mmol/L (ref 22–32)
Calcium: 9.8 mg/dL (ref 8.9–10.3)
Chloride: 98 mmol/L (ref 98–111)
Creatinine, Ser: 8.18 mg/dL — ABNORMAL HIGH (ref 0.61–1.24)
GFR calc Af Amer: 7 mL/min — ABNORMAL LOW (ref >60)
GFR calc non Af Amer: 6 mL/min — ABNORMAL LOW (ref >60)
Glucose, Bld: 96 mg/dL (ref 70–99)
Potassium: 3.9 mmol/L (ref 3.5–5.1)
Sodium: 136 mmol/L (ref 135–145)
Total Bilirubin: 1.1 mg/dL (ref 0.3–1.2)
Total Protein: 4.7 g/dL — ABNORMAL LOW (ref 6.5–8.1)

## 2019-05-04 MED ORDER — SODIUM CHLORIDE 0.9% IV SOLUTION: Freq: Once | INTRAVENOUS | Status: DC

## 2019-05-04 MED ORDER — SODIUM CHLORIDE 0.9 % IV SOLN: 100.0000 mL | INTRAVENOUS | Status: DC | PRN

## 2019-05-04 MED ORDER — DOCUSATE SODIUM 100 MG PO CAPS
100.0000 mg | ORAL_CAPSULE | Freq: Two times a day (BID) | ORAL | Status: DC
  Administered 2019-05-04 – 2019-05-09 (×11): 100 mg via ORAL
  Filled 2019-05-04 (×11): qty 1

## 2019-05-04 MED ORDER — DIPHENHYDRAMINE HCL 25 MG PO CAPS
25.0000 mg | ORAL_CAPSULE | Freq: Four times a day (QID) | ORAL | Status: DC | PRN
  Administered 2019-05-04: 15:00:00 25 mg via ORAL

## 2019-05-04 MED ORDER — HEPARIN SODIUM (PORCINE) 1000 UNIT/ML IJ SOLN
INTRAMUSCULAR | Status: AC
  Filled 2019-05-04: qty 4

## 2019-05-04 MED ORDER — SODIUM CHLORIDE 0.9% FLUSH
10.0000 mL | Freq: Two times a day (BID) | INTRAVENOUS | Status: DC
  Administered 2019-05-04 – 2019-05-07 (×6): 10 mL

## 2019-05-04 MED ORDER — LIDOCAINE HCL (PF) 1 % IJ SOLN
5.0000 mL | INTRAMUSCULAR | Status: DC | PRN
  Filled 2019-05-04: qty 5

## 2019-05-04 MED ORDER — LIDOCAINE-PRILOCAINE 2.5-2.5 % EX CREA: 1.0000 "application " | TOPICAL_CREAM | CUTANEOUS | Status: DC | PRN

## 2019-05-04 MED ORDER — HEPARIN SODIUM (PORCINE) 1000 UNIT/ML DIALYSIS
1000.0000 [IU] | INTRAMUSCULAR | Status: DC | PRN
  Filled 2019-05-04: qty 1

## 2019-05-04 MED ORDER — ALTEPLASE 2 MG IJ SOLR: 2.0000 mg | Freq: Once | INTRAMUSCULAR | Status: DC | PRN

## 2019-05-04 MED ORDER — DIPHENHYDRAMINE HCL 25 MG PO CAPS
ORAL_CAPSULE | ORAL | Status: AC
  Administered 2019-05-04: 25 mg via ORAL
  Filled 2019-05-04: qty 1

## 2019-05-04 MED ORDER — PENTAFLUOROPROP-TETRAFLUOROETH EX AERO: 1.0000 "application " | INHALATION_SPRAY | CUTANEOUS | Status: DC | PRN

## 2019-05-04 MED ORDER — DARBEPOETIN ALFA 60 MCG/0.3ML IJ SOSY
PREFILLED_SYRINGE | INTRAMUSCULAR | Status: AC
  Filled 2019-05-04: qty 0.3

## 2019-05-04 MED ORDER — SODIUM CHLORIDE 0.9% FLUSH
10.0000 mL | INTRAVENOUS | Status: DC | PRN
  Administered 2019-05-05: 10 mL
  Administered 2019-05-08: 30 mL
  Filled 2019-05-04 (×2): qty 40

## 2019-05-04 NOTE — Progress Notes (Signed)
Physical Therapy Note  Patient Details  Name: Alex Williams MRN: [id number] Date of Birth: 1950-09-17 Today's Date: 05/04/2019    Pt missed 75 min of skilled PT due to transport taking patient to HD. Scheduling made aware.    Canary Brim Ivory Broad, PT, DPT, CBIS  05/04/2019, 1:09 PM

## 2019-05-04 NOTE — Care Management (Signed)
[facility] Individual Statement of Services  Patient Name:  Alex Williams  Date:  05/04/2019  Welcome to the [facility].  Our goal is to provide you with an individualized program based on your diagnosis and situation, designed to meet your specific needs.  With this comprehensive rehabilitation program, you will be expected to participate in at least 3 hours of rehabilitation therapies Monday-Friday, with modified therapy programming on the weekends.  Your rehabilitation program will include the following services:  Physical Therapy (PT), Occupational Therapy (OT), Speech Therapy (ST), 24 hour per day rehabilitation nursing, Therapeutic Recreaction (TR), Neuropsychology, Case Management (Social Worker), Rehabilitation Medicine, Nutrition Services and Pharmacy Services  Weekly team conferences will be held on Wednesday to discuss your progress.  Your Social Worker will talk with you frequently to get your input and to update you on team discussions.  Team conferences with you and your family in attendance may also be held.  Expected length of stay: 5-7 days   Overall anticipated outcome: modified independent  Depending on your progress and recovery, your program may change. Your Social Worker will coordinate services and will keep you informed of any changes. Your Social Worker's name and contact numbers are listed  below.  The following services may also be recommended but are not provided by the [facility] will be made to provide these services after discharge if needed.  Arrangements include referral to agencies that provide these services.  Your insurance has been verified to be:  [place] Your primary doctor is:  [place]  Pertinent information will be shared with your doctor and your insurance company.  Social  Worker:  [place], [place] or ([phone]   Information discussed with and copy given to patient by: Lennart Pall, 05/04/2019, 10:00 AM

## 2019-05-04 NOTE — Progress Notes (Signed)
Alex Williams PHYSICAL MEDICINE & REHABILITATION PROGRESS NOTE   Subjective/Complaints:  Pt reports some incisional chest pain (not cardiac?) when strained while pooping- thinks strained too much- needs something to make stools less hard- was able to eventually go.  LBM prior to that was 2-3 days ago.  Still has numbness in L 3rd to 5th digits on L hand- it's the same as it's been .   Said voltaren gel has been somewhat to esp helpful for R knee pain.   Review of systems negative for chest pain except for with cough negative for shortness of breath negative for nausea vomiting diarrhea no constipation    Objective:   No results found. Recent Labs    05/03/19 1051 05/04/19 0425  WBC  --  6.9  HGB 7.0* 6.4*  HCT 22.6* 20.4*  PLT  --  210   Recent Labs    05/04/19 0425  NA 136  K 3.9  CL 98  CO2 27  GLUCOSE 96  BUN 51*  CREATININE 8.18*  CALCIUM 9.8    Intake/Output Summary (Last 24 hours) at 05/04/2019 1111 Last data filed at 05/04/2019 0700 Gross per 24 hour  Intake 720 ml  Output 1 ml  Net 719 ml     Physical Exam: Vital Signs Blood pressure (!) 120/51, pulse 73, temperature (!) 97.5 F (36.4 C), temperature source Oral, resp. rate 17, weight 127.7 kg, SpO2 99 %.  Physical Exam  Vitals and labs reviewed. Constitutional: pt awake, alert, appropriate, sitting on EOB in room; rubbing fingers together on L hand, NAD HENT:  Head: Normocephalic and atraumatic.  Eyes: conjugate gaze.  Neck: No tracheal deviation present. No thyromegaly present CV- rate controlled- irregular rhythm.  Respiratory:  Has fair to good air movement with a few crackles heard in bases B/L- no wheezing GI: soft, NT, ND, (+BS Musculoskeletal:     Comments: Edema and bilateral lower extremities and left upper extremity- L hand esp is swollen, but less so than last week- 1-2+  TTP over R knee esp over patella L hand- limited extension of fingers Neurological: He is alert.  Follows  full commands.   Cooperative with exam. Motor: Right upper extremity: 5/5 proximal distal Left upper extremity: prox 5/5; hand 2-4/5 - mainly lacking extension Bilateral lower extremities: 4--4/5 proximal distal Difficulty flexing PIPs and DIPs on L hand in 3rd/4th/5th digit- can flex MCPs Esp in ulnar distribution, however also mildly affected in median distribution, just not to same extent as ulnar.  Skin:  Midline chest incision C/D/I , no tenderness along the incision Psychiatric: full affect    Assessment/Plan: 1. Functional deficits secondary to debility/ NSTEMI and CABG x 3 vessels and new ESRD/with HD which require 3+ hours per day of interdisciplinary therapy in a comprehensive inpatient rehab setting.  Physiatrist is providing close team supervision and 24 hour management of active medical problems listed below.  Physiatrist and rehab team continue to assess barriers to discharge/monitor patient progress toward functional and medical goals  Care Tool:  Bathing    Body parts bathed by patient: Right arm, Left arm, Chest, Abdomen, Front perineal area, Buttocks, Right upper leg, Face, Left lower leg, Right lower leg, Left upper leg         Bathing assist Assist Level: Minimal Assistance - Patient > 75%     Upper Body Dressing/Undressing Upper body dressing   What is the patient wearing?: Hospital gown only    Upper body assist Assist Level: Minimal Assistance -  Patient > 75%    Lower Body Dressing/Undressing Lower body dressing      What is the patient wearing?: Pants     Lower body assist Assist for lower body dressing: Minimal Assistance - Patient > 75%     Toileting Toileting    Toileting assist Assist for toileting: Minimal Assistance - Patient > 75% Assistive Device Comment: (walker)   Transfers Chair/bed transfer  Transfers assist     Chair/bed transfer assist level: Contact Guard/Touching assist     Locomotion Ambulation   Ambulation  assist      Assist level: Contact Guard/Touching assist Assistive device: Walker-rolling Max distance: 86   Walk 10 feet activity   Assist     Assist level: Contact Guard/Touching assist Assistive device: Walker-rolling   Walk 50 feet activity   Assist    Assist level: Contact Guard/Touching assist Assistive device: Walker-rolling    Walk 150 feet activity   Assist    Assist level: Contact Guard/Touching assist Assistive device: Walker-rolling    Walk 10 feet on uneven surface  activity   Assist           Wheelchair     Assist Will patient use wheelchair at discharge?: No      Wheelchair assist level: Maximal Assistance - Patient 25 - 49%(attempted w/LEs only due to sternal precs.) Max wheelchair distance: 21ft    Wheelchair 50 feet with 2 turns activity    Assist    Wheelchair 50 feet with 2 turns activity did not occur: Safety/medical concerns       Wheelchair 150 feet activity     Assist  Wheelchair 150 feet activity did not occur: Safety/medical concerns       Blood pressure (!) 120/51, pulse 73, temperature (!) 97.5 F (36.4 C), temperature source Oral, resp. rate 17, weight 127.7 kg, SpO2 99 %.  Medical Problem List and Plan: 1.  Debility secondary to non-STEMI status post CABG x3 04/16/2019.  Sternal precautions                 CIR PT OT evaluations  10/9- pt also has peripheral neuropathy, maybe due to OR positioning of LUE- will likely need EMG, this can be done as outpatient 2.  Antithrombotics: -DVT/anticoagulation: Eliquis             -antiplatelet therapy: Aspirin 81 mg daily 3. Pain Management: Oxycodone/Ultram as needed  10/9- will order voltaren gel for R knee QID 2g 4. Mood: Provide emotional support             -antipsychotic agents: N/A 5. Neuropsych: This patient is capable of making decisions on his own behalf. 6. Skin/Wound Care: Routine skin checks 7. Fluids/Electrolytes/Nutrition: Routine in and outs.   CMP ordered for tomorrow a.m.  10/9- electrolytes look great/stable 8.  Acute on chronic anemia.  CBC ordered for tomorrow a.m.  Continue Aranesp  10/9- Hb stable at 7.3 (was 7.4)  10/12- Hb dropped from 7.0 10/11 to 6.4 this AM- waiting to see if transfused in HD today  9.  Atrial fibrillation with RVR.  Amiodarone 200 mg twice daily.  Cardiac rate controlled             Monitor with increased physical exertion. 10.  Hypotension.  ProAmatine 5 mg Monday Wednesday Friday            Vitals:   05/03/19 1959 05/04/19 0304  BP: 135/60 (!) 120/51  Pulse: 71 73  Resp: 18 17  Temp: 98  F (36.7 C) (!) 97.5 F (36.4 C)  SpO2: 100% 99%  Improved, 10/11 11.  ESRD on hemodialysis.  Status post tunneled catheter 04/29/2019- no shower/bed baths with catheter.  12.  Hyperlipidemia.  Crestor 13.  Constipation with hemorrhoids.  Laxative assistance.  Continue Tucks pads  10/12- added Colace 1 tab BID- might need more laxatives- will see. 14. LUE nerve compression- will need EMG after Rehab D/C. 15. Edema- will see if OT can get compression glove for edema control or kineseotaping.  16. Leukocytosis- afebrile and no signs of infection- WBC 12.3 down from 12.6- will recheck Monday or earlier if signs of infection.  10/12- WBC down to 6.9k  LOS: 4 days A FACE TO FACE EVALUATION WAS PERFORMED  Alex Williams 05/04/2019, 11:11 AM

## 2019-05-04 NOTE — Progress Notes (Signed)
Pt received 1 unit of PRBCs; tolerated the infusion well.

## 2019-05-04 NOTE — Progress Notes (Signed)
Occupational Therapy Session Note  Patient Details  Name: Alex Williams MRN: [id number] Date of Birth: Dec 13, 1950  Today's Date: 05/04/2019 OT Individual Time: 1120-1205 OT Individual Time Calculation (min): 45 min    Short Term Goals: Week 1:  OT Short Term Goal 1 (Week 1): STGs equal to LTGs set at overall modified independent level overall.  Skilled Therapeutic Interventions/Progress Updates:    Pt seen for OT ADL Bathing/dressing session. Pt sitting up in recliner upon arrival, complaints of fatigue from previous tx sessions but willing to participate as able and no need for pain intervention. Min A to power into standing from low recliner. Ambulated to w/c at sink with guarding assist. Completed bathing/dressing routine from w/c level at sink, set-up assist UB bathing/dressing.  Provided with education/demonstration for use of LH sponge to wash LEs. Assist required to reach buttock- will introduce toilet aid in next session. Mod A LB dressing from w/c level, able to don socks when seated EOB to support LE on bed in order to thread on. He ambulated into bathroom and completed toilet transfer with supervision with BSC over toilet, no void.  Pt requesting to return to bed at end of session, but wanting to stay sitting EOB when lunch tray delivered at end of session, left seated EOB set-up with lunch tray, all needs in reach and bed alarm on. Pt required increased time and rest breaks throughout tx session 2/2 decreased functional activity tolerance, also requiring VCs to adhere to sternal pre-cautions during functional tasks.    Therapy Documentation Precautions:  Precautions Precautions: Sternal, Fall Precaution Comments: reviewed precautions with patient  Restrictions Weight Bearing Restrictions: Yes RUE Weight Bearing: Non weight bearing RUE Partial Weight Bearing Percentage or Pounds: sternal LUE Weight Bearing: Non weight bearing LUE Partial Weight Bearing Percentage or Pounds:  sternal  Therapy/Group: Individual Therapy  Marializ Ferrebee L 05/04/2019, 7:06 AM

## 2019-05-04 NOTE — Progress Notes (Signed)
Physical Therapy Session Note  Patient Details  Name: Alex Williams MRN: [id number] Date of Birth: [birth year]  Today's Date: 05/04/2019 PT Individual Time: 0904-1002 PT Individual Time Calculation (min): 58 min   Short Term Goals: Week 1:  PT Short Term Goal 1 (Week 1): STG=LTG  Skilled Therapeutic Interventions/Progress Updates:   Pt seated on toilet, with [name], Therapist, sports in attendance.  Sit> stand from toilet with supervision.  Pt had no results on toilet.  He c/o pain of constipation; [name] aware.  Gait training, RW, to sink and bed, CGA.  Hand washing in standing at sink, supervision.  Pt noted to have significant, pitting edema bil LEs.  PT donned TEDS. Pt donned pants and shirt with set up, in sitting.  Pt donned shoes with set up.  Seated Therapeutic exercises performed with LEs to increase strength for functional mobility: 2 x 10 bil hip adduction against pillow with feet apart, ankle DF/PF R/L, 1 x 10 long arc quad knee extensions. Pt rested between sets.  Stand/step transfer to w/c with RW, CGA.in w/c, bil hip adduction 1 x 10.   Pt reported that his sister's home entry is level at the usual entry.  Gait training on level tile x 86' with 2 turns, RW, CGA.  Pt SOB afterward.  O2 sats 100%, HR 74 in sitting.  Advanced gait training with RW, side stepping R/L with CGA.   Pt fatigued but willing to stay OOB at end of session.  Pt transferred to recliner; reclined back with feet elevated to address edema.  Seat pad alarm set and needs left at hand.     Therapy Documentation Precautions:  Precautions Precautions: Sternal, Fall Precaution Comments: reviewed precautions with patient  Restrictions Weight Bearing Restrictions: Yes RUE Weight Bearing: Non weight bearing RUE Partial Weight Bearing Percentage or Pounds: sternal LUE Weight Bearing: Non weight bearing LUE Partial Weight Bearing Percentage or Pounds: sternal  Pain: a little bit; post-op chest, no rated.  Declined med.          Therapy/Group: Individual Therapy  [name] 05/04/2019, 10:10 AM

## 2019-05-04 NOTE — Progress Notes (Signed)
Lab called, HGB this morning 6.4. Pt has dialysis scheduled for today. Silvestre Mesi, PA notified and discussed. VSS. No new orders at this time. Will continue to monitor.

## 2019-05-04 NOTE — Progress Notes (Addendum)
[facility] Progress Note   Subjective: Sitting up at bedside, NAD. Says he feels stronger. HD today.  HGB down to 6.4. He agrees to transfuse 1 unit PRBCs with HD today.  Objective Vitals:   05/03/19 0334 05/03/19 0732 05/03/19 1959 05/04/19 0304  BP: (!) 110/51 111/68 135/60 (!) 120/51  Pulse: 76 72 71 73  Resp: 18 16 18 17   Temp: 98.1 F (36.7 C) 97.8 F (36.6 C) 98 F (36.7 C) (!) 97.5 F (36.4 C)  TempSrc: Oral Oral Oral Oral  SpO2: 97% 100% 100% 99%  Weight: 127.7 kg      Physical Exam General: Pleasant older male in NAD Heart: [id number] 2/6 systolic M. No JVD.  Lungs: CTAB A/P Abdomen: S, NT Extremities: 1+ BLE edema Dialysis Access: RIJ TDC Drsg CDI, Maturing R AVF + bruit. Bruising present.     Additional Objective Labs: Basic Metabolic Panel: Recent Labs  Lab 04/29/19 1933 04/30/19 0450 05/04/19 0425  NA 137 138 136  K 4.2 4.2 3.9  CL 101 100 98  CO2 23 26 27   GLUCOSE 108* 84 96  BUN 64* 27* 51*  CREATININE 8.31* 4.85* 8.18*  CALCIUM 11.3* 9.9 9.8  PHOS 7.0*  --   --    Liver Function Tests: Recent Labs  Lab 04/29/19 1933 05/04/19 0425  AST  --  23  ALT  --  19  ALKPHOS  --  94  BILITOT  --  1.1  PROT  --  4.7*  ALBUMIN 2.6* 2.2*   No results for input(s): LIPASE, AMYLASE in the last 168 hours. CBC: Recent Labs  Lab 04/28/19 0354 04/29/19 1640 04/30/19 0450 05/03/19 1051 05/04/19 0425  WBC 17.9* 12.6* 12.3*  --  6.9  NEUTROABS 12.3*  --  8.6*  --  4.3  HGB 8.9* 7.4* 7.3* 7.0* 6.4*  HCT 29.3* 23.4* 24.2* 22.6* 20.4*  MCV 92.4 92.5 93.8  --  92.7  PLT 402* 300 297  --  210   Blood Culture No results found for: SDES, SPECREQUEST, CULT, REPTSTATUS  Cardiac Enzymes: No results for input(s): CKTOTAL, CKMB, CKMBINDEX, TROPONINI in the last 168 hours. CBG: No results for input(s): GLUCAP in the last 168 hours. Iron Studies: No results for input(s): IRON, TIBC, TRANSFERRIN, FERRITIN in the last 72  hours. @lablastinr3 @ Studies/Results: No results found. Medications: . sodium chloride    . sodium chloride    . ferric gluconate (FERRLECIT/NULECIT) IV     . allopurinol  100 mg Oral Daily  . amiodarone  200 mg Oral BID  . apixaban  5 mg Oral BID  . aspirin EC  81 mg Oral Daily  . B-complex with vitamin C  1 tablet Oral Daily  . bisacodyl  10 mg Oral Daily   Or  . bisacodyl  10 mg Rectal Daily  . bisoprolol  2.5 mg Oral Once per day on Sun Tue Thu Sat  . Chlorhexidine Gluconate Cloth  6 each Topical Q0600  . cinacalcet  30 mg Oral Q supper  . darbepoetin (ARANESP) injection - DIALYSIS  60 mcg Intravenous Q Mon-HD  . diclofenac sodium  2 g Topical QID  . docusate sodium  100 mg Oral BID  . feeding supplement (PRO-STAT SUGAR FREE 64)  30 mL Oral BID  . fluticasone  2 spray Each Nare Daily  . heparin  1,000 Units Intravenous Q M,W,F-HD  . midodrine  5 mg Oral Q M,W,F-HD  . pantoprazole  40 mg Oral Daily  .  psyllium  1 packet Oral BID  . rosuvastatin  10 mg Oral q1800  . sevelamer carbonate  800 mg Oral TID WC  . sodium chloride flush  10-40 mL Intracatheter Q12H     Dialysis Orders: [place] MWF - new start, had OP HD on 9/21 and 9/23 prior to admission 3h 132kg 300/600 RUE AVF 2/2 bath Hep none venofer 100 mg ordered each treatment x 4,Hb 8.5 on 9/21 noESA order yet PTH 1366 on 04/15/19  Assessment/Plan: 1.Cardiogenic shock/STEMI - s/p emergent CABG on 9/24. S/p impella removed 04/19/19. Per HF team &CTS.  2. ESRD -on HD MWF. New start w/2OP HD prior to admission. CRRT from 9/25-10/2. OP staff reported difficultly w/AVF cannulation, TDC placed by [facility] on 10/7. OP fistulogram scheduled in 2 weeks w/ [facility]. Next HD 10/12. 3. Anemia of CKD&ABLA - transfuse prn. Hgb 6.4 today. Give 1 unit PRBCs on HD today.  Continue aranesp 14mcg qwk ordered w/HD Monday. tsat 14%, iron course ordered to start 10/12.  4. Secondary hyperparathyroidism -Ca 9.9, corrected~11  phos 7, OP pth 1366 on 9/23. Low Ca bath, Renvela 800mg  TID &sensipar 30mg  qd started on 10/7, may need OP parathyroid scan d/t high Ca &high pth. Will need weekly Ca rechecks. 5. Hypotension/volume -BP currently well controlled. Midodrine titrated down now to just 5mg  pre HD. Tolerating so far. Needs to have midodrine pre HD for ultrafiltration. Continue to titrate down volume as tolerated. Low dose Bisoprolol started.  6. CAD s/p CABG x3 on 9/24, ASA 81mg  qd 7. Afib on PO amio & eliquis 8. ID - s/p empiric IV ABX stopped on 10/2. WBC improved to 12.3.  9. Possible urinary retention - bladder scans, I/O cath if necessary 10: Dispo: in rehab  [place]. Michell Giuliano NP-C 05/04/2019, 12:10 PM  [employer] [phone]

## 2019-05-05 ENCOUNTER — Inpatient Hospital Stay (HOSPITAL_COMMUNITY): Payer: Medicare Other | Admitting: Occupational Therapy

## 2019-05-05 ENCOUNTER — Inpatient Hospital Stay (HOSPITAL_COMMUNITY): Payer: Medicare Other

## 2019-05-05 LAB — BPAM RBC
Blood Product Expiration Date: 202010122359
ISSUE DATE / TIME: 202010121434
Unit Type and Rh: 9500

## 2019-05-05 LAB — TYPE AND SCREEN
ABO/RH(D): O NEG
Antibody Screen: NEGATIVE
Unit division: 0

## 2019-05-05 MED ORDER — PREGABALIN 50 MG PO CAPS
50.0000 mg | ORAL_CAPSULE | Freq: Two times a day (BID) | ORAL | Status: DC
  Administered 2019-05-05 – 2019-05-09 (×9): 50 mg via ORAL
  Filled 2019-05-05 (×10): qty 1

## 2019-05-05 MED ORDER — HYDROCORTISONE ACETATE 25 MG RE SUPP
25.0000 mg | Freq: Two times a day (BID) | RECTAL | Status: DC
  Filled 2019-05-05 (×6): qty 1

## 2019-05-05 NOTE — Progress Notes (Signed)
Occupational Therapy Session Note  Patient Details  Name: Alex Williams MRN: [id number] Date of Birth: [birth year]  Today's Date: 05/05/2019 OT Individual Time: 0730-0830 OT Individual Time Calculation (min): 60 min    Short Term Goals: Week 1:  OT Short Term Goal 1 (Week 1): STGs equal to LTGs set at overall modified independent level overall.  Skilled Therapeutic Interventions/Progress Updates:    Pt seen for OT ADL bathing/dressing session. Pt up ambulating to bathroom with assist of RN upon arrival, hand off to OT. Pt completed toileting task with guarding assist.  He returned to EOB to eat breakfast mod I. Pt with complaints of increased L UE nerve pain, specifically in ulnar nerve distributaiton area of hand as well as pain with wrist flexion and pronation/supination. RN and MD made aware. He completed sit<>Stand throughout session with close supervision and RW, improved adherence to spinal pre-cautions during ADL and functional mobility today. Completed bathing/dressing routine from standard chair at sink which pt plans to do at d/c. Pt provided with toilet tongs and simulated demonstration for use. PT able to return demonstrate ability to complete pericare and buttock hygiene using tongs with guarding assist.  UB bathing/dressing completed with set-up assist. Min A LB bathing as pt fatiguing and requesting assist to don/doff socks and wash feet. Pants donned with CGA. Throughout bathing/dressing routine, pt required increased time and rest breaks 2/2 decreased functional activity tolerance and increase in pain with R hand use and UE mobility.  Pt left seated in standard chair at end of session, all needs in reach.   Therapy Documentation Precautions:  Precautions Precautions: Sternal, Fall Precaution Comments: reviewed precautions with patient  Restrictions Weight Bearing Restrictions: Yes RUE Weight Bearing: Non weight bearing RUE Partial Weight Bearing Percentage or Pounds:  sternal LUE Weight Bearing: Non weight bearing LUE Partial Weight Bearing Percentage or Pounds: sternal LLE Weight Bearing: Non weight bearing   Therapy/Group: Individual Therapy  Josemiguel Gries L 05/05/2019, 6:51 AM

## 2019-05-05 NOTE — Progress Notes (Signed)
[facility] Progress Note   Subjective: Seen lying in bed today. C/O pain in L wrist, numbness Mid finger to pinky finger. (No AVF on this side!)  Said HD was "rough" yesterday but does not say what happened.   Objective Vitals:   05/04/19 1700 05/04/19 1729 05/04/19 1924 05/05/19 0347  BP: (!) 124/58 (!) 137/56 (!) 127/55 (!) 161/67  Pulse: 76 80 77 91  Resp:  16 18 18   Temp:  98.2 F (36.8 C) 98 F (36.7 C) 98.4 F (36.9 C)  TempSrc:  Oral Oral   SpO2:  96% 97% 98%  Weight:  125.3 kg     Physical Exam General: Pleasant older male in NAD Heart: [id number] 2/6 systolic M. No JVD.  Lungs: CTAB A/P Abdomen: S, NT Extremities: 1+ BLE edema Dialysis Access: RIJ TDC Drsg CDI, Maturing R AVF + bruit. Bruising present.    sis Orders:Additional Objective Labs: Basic Metabolic Panel: Recent Labs  Lab 04/29/19 1933 04/30/19 0450 05/04/19 0425 05/04/19 1114  NA 137 138 136 134*  K 4.2 4.2 3.9 4.3  CL 101 100 98 96*  CO2 23 26 27 25   GLUCOSE 108* 84 96 112*  BUN 64* 27* 51* 56*  CREATININE 8.31* 4.85* 8.18* 8.57*  CALCIUM 11.3* 9.9 9.8 9.7  PHOS 7.0*  --   --  4.6   Liver Function Tests: Recent Labs  Lab 04/29/19 1933 05/04/19 0425 05/04/19 1114  AST  --  23  --   ALT  --  19  --   ALKPHOS  --  94  --   BILITOT  --  1.1  --   PROT  --  4.7*  --   ALBUMIN 2.6* 2.2* 2.4*   No results for input(s): LIPASE, AMYLASE in the last 168 hours. CBC: Recent Labs  Lab 04/29/19 1640 04/30/19 0450 05/03/19 1051 05/04/19 0425 05/04/19 1114  WBC 12.6* 12.3*  --  6.9 7.3  NEUTROABS  --  8.6*  --  4.3  --   HGB 7.4* 7.3* 7.0* 6.4* 6.9*  HCT 23.4* 24.2* 22.6* 20.4* 21.9*  MCV 92.5 93.8  --  92.7 92.0  PLT 300 297  --  210 224   Blood Culture No results found for: SDES, SPECREQUEST, CULT, REPTSTATUS  Cardiac Enzymes: No results for input(s): CKTOTAL, CKMB, CKMBINDEX, TROPONINI in the last 168 hours. CBG: No results for input(s): GLUCAP in the last 168  hours. Iron Studies: No results for input(s): IRON, TIBC, TRANSFERRIN, FERRITIN in the last 72 hours. @lablastinr3 @ Studies/Results: No results found. Medications: . ferric gluconate (FERRLECIT/NULECIT) IV 125 mg (05/04/19 1625)   . sodium chloride   Intravenous Once  . allopurinol  100 mg Oral Daily  . amiodarone  200 mg Oral BID  . apixaban  5 mg Oral BID  . aspirin EC  81 mg Oral Daily  . B-complex with vitamin C  1 tablet Oral Daily  . bisacodyl  10 mg Oral Daily   Or  . bisacodyl  10 mg Rectal Daily  . bisoprolol  2.5 mg Oral Once per day on Sun Tue Thu Sat  . Chlorhexidine Gluconate Cloth  6 each Topical Q0600  . cinacalcet  30 mg Oral Q supper  . darbepoetin (ARANESP) injection - DIALYSIS  60 mcg Intravenous Q Mon-HD  . diclofenac sodium  2 g Topical QID  . docusate sodium  100 mg Oral BID  . feeding supplement (PRO-STAT SUGAR FREE 64)  30 mL Oral BID  .  fluticasone  2 spray Each Nare Daily  . heparin  1,000 Units Intravenous Q M,W,F-HD  . hydrocortisone  25 mg Rectal BID  . midodrine  5 mg Oral Q M,W,F-HD  . pantoprazole  40 mg Oral Daily  . pregabalin  50 mg Oral BID  . psyllium  1 packet Oral BID  . rosuvastatin  10 mg Oral q1800  . sevelamer carbonate  800 mg Oral TID WC  . sodium chloride flush  10-40 mL Intracatheter Q12H     Dialysis Orders: [place] MWF - new start, had OP HD on 9/21 and 9/23 prior to admission 3h 132kg 300/600 RUE AVF 2/2 bath Hep none venofer 100 mg ordered each treatment x 4,Hb 8.5 on 9/21 noESA order yet PTH 1366 on 04/15/19  Assessment/Plan: 1.Cardiogenic shock/STEMI - s/p emergent CABG on 9/24. S/p impella removed 04/19/19. Per HF team &CTS.  2. ESRD -on HD MWF. New start w/2OP HD prior to admission. CRRT from 9/25-10/2. OP staff reported difficultly w/AVF cannulation, TDC placed by [facility] on 10/7. OP fistulogram scheduled in 2 weeksw/ [facility]. Next HD 10/14. 3. Anemia of CKD&ABLA - transfuse prn. Hgb 6.4 05/04/19  Rec'd 1 unit PRBCs on HD Recheck HGB today.  Continue aranesp 46mcg qwk ordered w/HD Monday. tsat 14%, iron course ordered to start 10/12.  4. Secondary hyperparathyroidism -Ca 9.7 C Ca 10.9  OP Phos at goal.  pth 1366 on 9/23. Low Ca bath, Renvela 800mg  TID &sensipar 30mg  qd started on 10/7, may need OP parathyroid scan d/t high Ca &high pth. Will need weekly Ca rechecks. 5. Hypotension/volume -BP currently well controlled. Midodrine titrated down now to just 5mg  pre HD. Tolerating so far. Still with BLE edema. Continue lowering volume as tolerated.  6. CAD s/p CABG x3 on 9/24, ASA 81mg  qd 7. Afib on PO amio & eliquis 8. ID - s/p empiric IV ABX stopped on 10/2. WBC improved to 12.3.  9. Possible urinary retention - bladder scans, I/O cath if necessary 10: Dispo: in rehab  [place]. Vincenta Steffey NP-C 05/05/2019, 12:37 PM  [employer] [phone]

## 2019-05-05 NOTE — Progress Notes (Signed)
Occupational Therapy Session Note  Patient Details  Name: Alex Williams MRN: [id number] Date of Birth: [birth year]  Today's Date: 05/05/2019 OT Individual Time: 1425-1535 OT Individual Time Calculation (min): 70 min    Short Term Goals: Week 1:  OT Short Term Goal 1 (Week 1): STGs equal to LTGs set at overall modified independent level overall.  Skilled Therapeutic Interventions/Progress Updates:    Began with pt sitting in the wheelchair with pt reporting increased dorsal wrist pain in the left hand.  This was not present on eval but pt reports that it started on approximately Sunday.  Slight swelling noted in the hand but appears less than on eval as well.  Therapist applied kinesiotape to the wrist and digits for gentle stretch to the wrist and fingers as he still lacks full digit extension.  Pt with no adverse reactions to tape currently at at end of session stated that he felt the tape was helping.  Next, took pt down to the dayroom where he transferred to the [facility] with min guard assist using the RW for support.  He was able to complete 5 intervals of 1-1.5 mins with resistance set on level 25 cm/sec.  HR increased to 85 with O2 sats at 92-94% post exercise.  Next, he transferred back to the wheelchair where he was taken down to the ADL apartment for practice with walk-in shower transfers.  He was able to complete simulated transfer to the walk-in shower with min guard assist, stepping in posteriorly and then sitting on the seat.  He was able to complete anterior transfer out of the shower, both with use of the RW for support.  Discussed need for a shower seat vs 3:1 and he will check with his sister to see if she has one.  Finished session with transfer back to the room and pt transferring back to the bed with min guard assist.  Call button and phone in reach with safety alarm in place.  LUE positioned on pillows for comfort.    Therapy Documentation Precautions:  Precautions Precautions:  Sternal, Fall Precaution Comments: reviewed precautions with patient  Restrictions Weight Bearing Restrictions: No RUE Weight Bearing: Non weight bearing RUE Partial Weight Bearing Percentage or Pounds: sternal LUE Weight Bearing: Non weight bearing LUE Partial Weight Bearing Percentage or Pounds: sternal LLE Weight Bearing: Non weight bearing     Pain: Pain Assessment Pain Scale: Faces Faces Pain Scale: Hurts little more Pain Type: Acute pain Pain Location: Wrist Pain Orientation: Left Pain Descriptors / Indicators: Grimacing;Discomfort Pain Onset: With Activity Pain Intervention(s): Repositioned;Other (Comment)(Kinesiotape) ADL: See Care Tool Section for ADL tasks and mobility.  Therapy/Group: Individual Therapy  Stephanie Littman OTR/L 05/05/2019, 3:56 PM

## 2019-05-05 NOTE — Progress Notes (Signed)
Physical Therapy Session Note  Patient Details  Name: Alex Williams MRN: [id number] Date of Birth: 11-16-1950  Today's Date: 05/05/2019 PT Individual Time: 0915-1015 PT Individual Time Calculation (min): 60 min   Short Term Goals: Week 1:  PT Short Term Goal 1 (Week 1): STG=LTG  Skilled Therapeutic Interventions/Progress Updates:     Patient in recliner with RN providing morning medications upon PT arrival. Patient alert and agreeable to PT session. Patient reported 10/10 L wrist pain and numbness during session, RN made aware and provided pain medicine during session. PT provided repositioning, rest breaks, and distraction as pain interventions throughout session. Focused session on HEP for seated exercises in the chair and working on bed mobility to perform with night shift so patient can sit EOB with nursing staff to take a break from lying on his back throughout the night.   Therapeutic Activity: Bed Mobility: Patient performed supine to/from sit with min A x2, first trial patient pushed though his elbow to sit up. PT educated on importance of maintaining sternal precautions and patient did not use his UEs on the second trial. Added clearance for nursing staff to perform bed mobility with patient at night with the nursing staff to his safety sheet and notified RN. Transfers: Patient performed sit to/from x1 with B UE use despite PT cues to stand without UE use to follow sternal precautions. He then performed sit to/from stand x3 with progressively lower bed height with close supervision with arms across chest to promote no arm use during transfers. Provided verbal cues for leaning forward to standing, maintaining arms across his chest, and glut activation to bring hips forward in standing.  Gait Training:  Patient ambulated 15 feet using RW with supervision. Ambulated with decreased gait speed, decreased step length, increased hip and knee flexion, forward trunk lean, and downward head gaze.  Provided verbal cues for erect poster, looking ahead, and increased step height to reduce fall risk.  Therapeutic Exercise: Patient performed the 2 sets of the following exercises, provided as HEP for when sitting in the room between therapies, with verbal and tactile cues for proper technique.  Exercises  Seated Long Arc Quad - 10 reps - 3 sets - 3x daily - 7x weekly  Seated March - 10 reps - 3 sets - 3x daily - 7x weekly  Seated Isometric Hip Adduction with Ball - 10 reps - 3 sets - 3x daily - 7x weekly  Seated Gluteal Sets - 10 reps - 3 sets - 3x daily - 7x weekly  Seated Heel Toe Raises - 10 reps - 3 sets - 3x daily - 7x weekly   Patient in bed at end of session with breaks locked, bed alarm set, and all needs within reach. Educated on promotion of physical activity upon d/c. Patient reported that he would like to start walking outside, as he use to hunt and fish, and would like to walk at the park 1 mile from his house eventually.    Therapy Documentation Precautions:  Precautions Precautions: Sternal, Fall Precaution Comments: reviewed precautions with patient  Restrictions Weight Bearing Restrictions: Yes RUE Weight Bearing: Non weight bearing RUE Partial Weight Bearing Percentage or Pounds: sternal LUE Weight Bearing: Non weight bearing LUE Partial Weight Bearing Percentage or Pounds: sternal LLE Weight Bearing: Non weight bearing    Therapy/Group: Individual Therapy  Lenda Baratta L Emaan Gary PT, DPT  05/05/2019, 1:32 PM

## 2019-05-05 NOTE — Plan of Care (Signed)
  Problem: Consults Goal: RH GENERAL PATIENT EDUCATION Description: See Patient Education module for education specifics. Outcome: Progressing Goal: Skin Care Protocol Initiated - if Braden Score 18 or less Description: If consults are not indicated, leave blank or document N/A Outcome: Progressing   Problem: RH BOWEL ELIMINATION Goal: RH STG MANAGE BOWEL WITH ASSISTANCE Description: STG Manage Bowel with mod I Assistance. Outcome: Progressing Goal: RH STG MANAGE BOWEL W/MEDICATION W/ASSISTANCE Description: STG Manage Bowel with Medication with mod I Assistance. Outcome: Progressing   Problem: RH BLADDER ELIMINATION Goal: RH STG MANAGE BLADDER WITH ASSISTANCE Description: STG Manage Bladder With mod I Assistance Outcome: Progressing   Problem: RH SKIN INTEGRITY Goal: RH STG SKIN FREE OF INFECTION/BREAKDOWN Outcome: Progressing Goal: RH STG MAINTAIN SKIN INTEGRITY WITH ASSISTANCE Description: STG Maintain Skin Integrity With mod I Assistance. Outcome: Progressing   Problem: RH SAFETY Goal: RH STG ADHERE TO SAFETY PRECAUTIONS W/ASSISTANCE/DEVICE Description: STG Adhere to Safety Precautions With Assistance/Device. Outcome: Progressing   Problem: RH PAIN MANAGEMENT Goal: RH STG PAIN MANAGED AT OR BELOW PT'S PAIN GOAL Description: Pain scale <4/10 Outcome: Progressing   Problem: RH KNOWLEDGE DEFICIT GENERAL Goal: RH STG INCREASE KNOWLEDGE OF SELF CARE AFTER HOSPITALIZATION Outcome: Progressing

## 2019-05-05 NOTE — Progress Notes (Signed)
[place] PHYSICAL MEDICINE & REHABILITATION PROGRESS NOTE   Subjective/Complaints:  Pt reports  Severe L hand pain/numbness- is nerve pain- oxycodone which gets for pain isn't the right kind of medicine- took tramadol at home- asked for nerve pain meds; Also hemorrhoids are bleeding/acting up and needs wall suction for room.  Swelling less in Feet B/L Had 1 unit pRBCs yesterday.  Review of systems negative for chest pain except for with cough negative for shortness of breath negative for nausea vomiting diarrhea no constipation    Objective:   No results found. Recent Labs    05/04/19 0425 05/04/19 1114  WBC 6.9 7.3  HGB 6.4* 6.9*  HCT 20.4* 21.9*  PLT 210 224   Recent Labs    05/04/19 0425 05/04/19 1114  NA 136 134*  K 3.9 4.3  CL 98 96*  CO2 27 25  GLUCOSE 96 112*  BUN 51* 56*  CREATININE 8.18* 8.57*  CALCIUM 9.8 9.7    Intake/Output Summary (Last 24 hours) at 05/05/2019 1323 Last data filed at 05/05/2019 0900 Gross per 24 hour  Intake 404 ml  Output 2000 ml  Net -1596 ml     Physical Exam: Vital Signs Blood pressure (!) 161/67, pulse 91, temperature 98.4 F (36.9 C), resp. rate 18, weight 125.3 kg, SpO2 98 %.  Physical Exam  Vitals and labs reviewed. Constitutional: pt awake, alert, appropriate, sitting on EOB in room; rubbing fingers together on L hand, appeared more painful on L hand today- wincing in pain, NAD HENT:  Head: Normocephalic and atraumatic.  Eyes: conjugate gaze.  Neck: No tracheal deviation present. No thyromegaly present CV- rate controlled- irregular rhythm.  Respiratory:  Has fair to good air movement with a few crackles heard in bases B/L- no wheezing GI: soft, NT, ND, (+BS Musculoskeletal:     Comments: Edema and bilateral lower extremities and left upper extremity- reduced down to 1+ in LEs and trace in L hand TTP over R knee esp over patella L hand- limited extension of fingers Neurological: He is alert.  Follows full  commands.   Cooperative with exam. Motor: Right upper extremity: 5/5 proximal distal Left upper extremity: prox 5/5; hand 2-4/5 - mainly lacking extension Bilateral lower extremities: 4--4/5 proximal distal Difficulty flexing PIPs and DIPs on L hand in 3rd/4th/5th digit- can flex MCPs Esp in ulnar distribution, however also mildly affected in median distribution, just not to same extent as ulnar.  Skin:  Midline chest incision C/D/I , no tenderness along the incision Psychiatric: full affect    Assessment/Plan: 1. Functional deficits secondary to debility/ NSTEMI and CABG x 3 vessels and new ESRD/with HD which require 3+ hours per day of interdisciplinary therapy in a comprehensive inpatient rehab setting.  Physiatrist is providing close team supervision and 24 hour management of active medical problems listed below.  Physiatrist and rehab team continue to assess barriers to discharge/monitor patient progress toward functional and medical goals  Care Tool:  Bathing    Body parts bathed by patient: Right arm, Left arm, Chest, Abdomen, Front perineal area, Buttocks, Right upper leg, Face, Left upper leg   Body parts bathed by helper: Right lower leg, Left lower leg     Bathing assist Assist Level: Minimal Assistance - Patient > 75%     Upper Body Dressing/Undressing Upper body dressing   What is the patient wearing?: Pull over shirt    Upper body assist Assist Level: Set up assist    Lower Body Dressing/Undressing Lower body  dressing      What is the patient wearing?: Pants     Lower body assist Assist for lower body dressing: Supervision/Verbal cueing     Toileting Toileting    Toileting assist Assist for toileting: Supervision/Verbal cueing Assistive Device Comment: (walker)   Transfers Chair/bed transfer  Transfers assist     Chair/bed transfer assist level: Supervision/Verbal cueing     Locomotion Ambulation   Ambulation assist      Assist  level: Contact Guard/Touching assist Assistive device: Walker-rolling Max distance: 86   Walk 10 feet activity   Assist     Assist level: Contact Guard/Touching assist Assistive device: Walker-rolling   Walk 50 feet activity   Assist    Assist level: Contact Guard/Touching assist Assistive device: Walker-rolling    Walk 150 feet activity   Assist    Assist level: Contact Guard/Touching assist Assistive device: Walker-rolling    Walk 10 feet on uneven surface  activity   Assist           Wheelchair     Assist Will patient use wheelchair at discharge?: No      Wheelchair assist level: Maximal Assistance - Patient 25 - 49%(attempted w/LEs only due to sternal precs.) Max wheelchair distance: 21ft    Wheelchair 50 feet with 2 turns activity    Assist    Wheelchair 50 feet with 2 turns activity did not occur: Safety/medical concerns       Wheelchair 150 feet activity     Assist  Wheelchair 150 feet activity did not occur: Safety/medical concerns       Blood pressure (!) 161/67, pulse 91, temperature 98.4 F (36.9 C), resp. rate 18, weight 125.3 kg, SpO2 98 %.  Medical Problem List and Plan: 1.  Debility secondary to non-STEMI status post CABG x3 04/16/2019.  Sternal precautions                 CIR PT OT evaluations  10/9- pt also has peripheral neuropathy, maybe due to OR positioning of LUE- will likely need EMG, this can be done as outpatient 2.  Antithrombotics: -DVT/anticoagulation: Eliquis             -antiplatelet therapy: Aspirin 81 mg daily 3. Pain Management: Oxycodone/Ultram as needed  10/9- will order voltaren gel for R knee QID 2g  10/13- suggested tramadol for hand until nerve pain meds kick in, not oxy; also, will start lyrica 50 mg BID- to get into system- then will change to all after HD in a few days. 4. Mood: Provide emotional support             -antipsychotic agents: N/A 5. Neuropsych: This patient is capable of  making decisions on his own behalf. 6. Skin/Wound Care: Routine skin checks 7. Fluids/Electrolytes/Nutrition: Routine in and outs.  CMP ordered for tomorrow a.m.  10/9- electrolytes look great/stable 8.  Acute on chronic anemia.  CBC ordered for tomorrow a.m.  Continue Aranesp  10/9- Hb stable at 7.3 (was 7.4)  10/12- Hb dropped from 7.0 10/11 to 6.4 this AM- waiting to see if transfused in HD today  10/13- transfused 1 unt pRBCs yesterday in HD.  9.  Atrial fibrillation with RVR.  Amiodarone 200 mg twice daily.  Cardiac rate controlled             Monitor with increased physical exertion. 10.  Hypotension.  ProAmatine 5 mg Monday Wednesday Friday            Vitals:  05/04/19 1924 05/05/19 0347  BP: (!) 127/55 (!) 161/67  Pulse: 77 91  Resp: 18 18  Temp: 98 F (36.7 C) 98.4 F (36.9 C)  SpO2: 97% 98%  Improved, 10/11 11.  ESRD on hemodialysis.  Status post tunneled catheter 04/29/2019- no shower/bed baths with catheter.  12.  Hyperlipidemia.  Crestor 13.  Constipation with hemorrhoids.  Laxative assistance.  Continue Tucks pads  10/12- added Colace 1 tab BID- might need more laxatives- will see. 14. LUE nerve compression- will need EMG after Rehab D/C. 15. Edema- will see if OT can get compression glove for edema control or kineseotaping.  16. Leukocytosis- afebrile and no signs of infection- WBC 12.3 down from 12.6- will recheck Monday or earlier if signs of infection.  10/12- WBC down to 6.9k  LOS: 5 days A FACE TO FACE EVALUATION WAS PERFORMED  Dequon Schnebly 05/05/2019, 1:23 PM

## 2019-05-05 NOTE — Plan of Care (Signed)
  Problem: RH Simple Meal Prep Goal: LTG Patient will perform simple meal prep w/assist (OT) Description: LTG: Patient will perform simple meal prep with assistance, with/without cues (OT). Outcome: Not Applicable Flowsheets (Taken 05/05/2019 1246) LTG: Pt will perform simple meal prep with assistance level of: (Goal d/c 10/13) -- Note: Goal d/c as not a pt priority at this time. Demetrie Borge, OTR/L

## 2019-05-06 ENCOUNTER — Inpatient Hospital Stay (HOSPITAL_COMMUNITY): Payer: Medicare Other

## 2019-05-06 ENCOUNTER — Inpatient Hospital Stay (HOSPITAL_COMMUNITY): Payer: Medicare Other | Admitting: Occupational Therapy

## 2019-05-06 LAB — RENAL FUNCTION PANEL
Albumin: 2.2 g/dL — ABNORMAL LOW (ref 3.5–5.0)
Anion gap: 14 (ref 5–15)
BUN: 35 mg/dL — ABNORMAL HIGH (ref 8–23)
CO2: 25 mmol/L (ref 22–32)
Calcium: 9.2 mg/dL (ref 8.9–10.3)
Chloride: 96 mmol/L — ABNORMAL LOW (ref 98–111)
Creatinine, Ser: 5.62 mg/dL — ABNORMAL HIGH (ref 0.61–1.24)
GFR calc Af Amer: 11 mL/min — ABNORMAL LOW (ref >60)
GFR calc non Af Amer: 9 mL/min — ABNORMAL LOW (ref >60)
Glucose, Bld: 101 mg/dL — ABNORMAL HIGH (ref 70–99)
Phosphorus: 3.8 mg/dL (ref 2.5–4.6)
Potassium: 3.5 mmol/L (ref 3.5–5.1)
Sodium: 135 mmol/L (ref 135–145)

## 2019-05-06 LAB — CBC
HCT: 23.7 % — ABNORMAL LOW (ref 39.0–52.0)
Hemoglobin: 7.1 g/dL — ABNORMAL LOW (ref 13.0–17.0)
MCH: 27.7 pg (ref 26.0–34.0)
MCHC: 30 g/dL (ref 30.0–36.0)
MCV: 92.6 fL (ref 80.0–100.0)
Platelets: 214 10*3/uL (ref 150–400)
RBC: 2.56 MIL/uL — ABNORMAL LOW (ref 4.22–5.81)
RDW: 16.6 % — ABNORMAL HIGH (ref 11.5–15.5)
WBC: 5.1 10*3/uL (ref 4.0–10.5)
nRBC: 0 % (ref 0.0–0.2)

## 2019-05-06 MED ORDER — RENA-VITE PO TABS
1.0000 | ORAL_TABLET | Freq: Every day | ORAL | Status: DC
  Administered 2019-05-07 – 2019-05-08 (×3): 1 via ORAL
  Filled 2019-05-06 (×3): qty 1

## 2019-05-06 MED ORDER — LIDOCAINE-PRILOCAINE 2.5-2.5 % EX CREA
1.0000 "application " | TOPICAL_CREAM | CUTANEOUS | Status: DC | PRN
  Filled 2019-05-06: qty 5

## 2019-05-06 MED ORDER — LIDOCAINE HCL (PF) 1 % IJ SOLN
5.0000 mL | INTRAMUSCULAR | Status: DC | PRN
  Filled 2019-05-06: qty 5

## 2019-05-06 MED ORDER — SODIUM CHLORIDE 0.9 % IV SOLN: 100.0000 mL | INTRAVENOUS | Status: DC | PRN

## 2019-05-06 MED ORDER — HEPARIN SODIUM (PORCINE) 1000 UNIT/ML IJ SOLN
INTRAMUSCULAR | Status: AC
  Administered 2019-05-06: 1000 [IU] via INTRAVENOUS
  Filled 2019-05-06: qty 4

## 2019-05-06 MED ORDER — ALTEPLASE 2 MG IJ SOLR: 2.0000 mg | Freq: Once | INTRAMUSCULAR | Status: DC | PRN

## 2019-05-06 MED ORDER — WHITE PETROLATUM EX OINT
TOPICAL_OINTMENT | CUTANEOUS | Status: AC
  Filled 2019-05-06: qty 28.35

## 2019-05-06 MED ORDER — PENTAFLUOROPROP-TETRAFLUOROETH EX AERO: 1.0000 "application " | INHALATION_SPRAY | CUTANEOUS | Status: DC | PRN

## 2019-05-06 MED ORDER — HEPARIN SODIUM (PORCINE) 1000 UNIT/ML DIALYSIS
1000.0000 [IU] | INTRAMUSCULAR | Status: DC | PRN
  Filled 2019-05-06: qty 1

## 2019-05-06 NOTE — Progress Notes (Signed)
[place] PHYSICAL MEDICINE & REHABILITATION PROGRESS NOTE   Subjective/Complaints:  Pt reports L hand pain much improved with addition of Lyrica- also has kineseotaping done of L hand.    Review of systems negative for chest pain except for with cough negative for shortness of breath negative for nausea vomiting diarrhea no constipation    Objective:   No results found. Recent Labs    05/04/19 0425 05/04/19 1114  WBC 6.9 7.3  HGB 6.4* 6.9*  HCT 20.4* 21.9*  PLT 210 224   Recent Labs    05/04/19 0425 05/04/19 1114  NA 136 134*  K 3.9 4.3  CL 98 96*  CO2 27 25  GLUCOSE 96 112*  BUN 51* 56*  CREATININE 8.18* 8.57*  CALCIUM 9.8 9.7    Intake/Output Summary (Last 24 hours) at 05/06/2019 0908 Last data filed at 05/05/2019 1900 Gross per 24 hour  Intake 260 ml  Output -  Net 260 ml     Physical Exam: Vital Signs Blood pressure (!) 133/54, pulse 72, temperature 98.3 F (36.8 C), temperature source Oral, resp. rate 16, weight 129 kg, SpO2 97 %.  Physical Exam  Vitals and labs reviewed. Constitutional: pt awake, alert, appropriate, sitting on EOB in room; rubbing L hand less; kineseotaping of dorsum of L hand; less swelling in L hand,  NAD HENT:  Head: Normocephalic and atraumatic.  Eyes: conjugate gaze.  Neck: No tracheal deviation present. No thyromegaly present CV- rate controlled- irregular rhythm.  Respiratory:  Has fair to good air movement with a few crackles heard in bases B/L- no wheezing GI: soft, NT, ND, (+BS Musculoskeletal:     Comments: Edema and bilateral lower extremities and left upper extremity- back up 2-3+ in LEs and trace in L hand TTP over R knee esp over patella L hand- limited extension of fingers Neurological: He is alert.  Follows full commands.   Cooperative with exam. Motor: Right upper extremity: 5/5 proximal distal Left upper extremity: prox 5/5; hand 2-4/5 - mainly lacking extension Bilateral lower extremities: 4--4/5  proximal distal Difficulty flexing PIPs and DIPs on L hand in 3rd/4th/5th digit- can flex MCPs- slightly improved- can make almost fist and claw today Esp in ulnar distribution, however also mildly affected in median distribution, just not to same extent as ulnar.  Skin:  Midline chest incision C/D/I , no tenderness along the incision Psychiatric: full affect    Assessment/Plan: 1. Functional deficits secondary to debility/ NSTEMI and CABG x 3 vessels and new ESRD/with HD which require 3+ hours per day of interdisciplinary therapy in a comprehensive inpatient rehab setting.  Physiatrist is providing close team supervision and 24 hour management of active medical problems listed below.  Physiatrist and rehab team continue to assess barriers to discharge/monitor patient progress toward functional and medical goals  Care Tool:  Bathing    Body parts bathed by patient: Right arm, Left arm, Chest, Abdomen, Front perineal area, Buttocks, Right upper leg, Face, Left upper leg   Body parts bathed by helper: Right lower leg, Left lower leg     Bathing assist Assist Level: Minimal Assistance - Patient > 75%     Upper Body Dressing/Undressing Upper body dressing   What is the patient wearing?: Pull over shirt    Upper body assist Assist Level: Set up assist    Lower Body Dressing/Undressing Lower body dressing      What is the patient wearing?: Pants     Lower body assist Assist for lower  body dressing: Supervision/Verbal cueing     Toileting Toileting    Toileting assist Assist for toileting: Supervision/Verbal cueing Assistive Device Comment: (walker)   Transfers Chair/bed transfer  Transfers assist     Chair/bed transfer assist level: Contact Guard/Touching assist     Locomotion Ambulation   Ambulation assist      Assist level: Supervision/Verbal cueing Assistive device: Walker-rolling Max distance: 15'   Walk 10 feet activity   Assist     Assist  level: Supervision/Verbal cueing Assistive device: Walker-rolling   Walk 50 feet activity   Assist    Assist level: Contact Guard/Touching assist Assistive device: Walker-rolling    Walk 150 feet activity   Assist    Assist level: Contact Guard/Touching assist Assistive device: Walker-rolling    Walk 10 feet on uneven surface  activity   Assist           Wheelchair     Assist Will patient use wheelchair at discharge?: No      Wheelchair assist level: Maximal Assistance - Patient 25 - 49%(attempted w/LEs only due to sternal precs.) Max wheelchair distance: 69ft    Wheelchair 50 feet with 2 turns activity    Assist    Wheelchair 50 feet with 2 turns activity did not occur: Safety/medical concerns       Wheelchair 150 feet activity     Assist  Wheelchair 150 feet activity did not occur: Safety/medical concerns       Blood pressure (!) 133/54, pulse 72, temperature 98.3 F (36.8 C), temperature source Oral, resp. rate 16, weight 129 kg, SpO2 97 %.  Medical Problem List and Plan: 1.  Debility secondary to non-STEMI status post CABG x3 04/16/2019.  Sternal precautions                 CIR PT OT evaluations  10/9- pt also has peripheral neuropathy, maybe due to OR positioning of LUE- will likely need EMG, this can be done as outpatient 2.  Antithrombotics: -DVT/anticoagulation: Eliquis             -antiplatelet therapy: Aspirin 81 mg daily 3. Pain Management: Oxycodone/Ultram as needed  10/9- will order voltaren gel for R knee QID 2g  10/13- suggested tramadol for hand until nerve pain meds kick in, not oxy; also, will start lyrica 50 mg BID- to get into system- then will change to all after HD in a few days.  10/14- pain much improved 4. Mood: Provide emotional support             -antipsychotic agents: N/A 5. Neuropsych: This patient is capable of making decisions on his own behalf. 6. Skin/Wound Care: Routine skin checks 7.  Fluids/Electrolytes/Nutrition: Routine in and outs.  CMP ordered for tomorrow a.m.  10/9- electrolytes look great/stable 8.  Acute on chronic anemia.  CBC ordered for tomorrow a.m.  Continue Aranesp  10/9- Hb stable at 7.3 (was 7.4)  10/12- Hb dropped from 7.0 10/11 to 6.4 this AM- waiting to see if transfused in HD today  10/13- transfused 1 unt pRBCs yesterday in HD.  10/14- Hb up to 6.9 after transfusion 9.  Atrial fibrillation with RVR.  Amiodarone 200 mg twice daily.  Cardiac rate controlled             Monitor with increased physical exertion. 10.  Hypotension.  ProAmatine 5 mg Monday Wednesday Friday            Vitals:   05/06/19 0109 05/06/19 0510  BP: Marland Kitchen)  124/50 (!) 133/54  Pulse:  72  Resp:  16  Temp:  98.3 F (36.8 C)  SpO2:  97%  Improved, 10/11 11.  ESRD on hemodialysis.  Status post tunneled catheter 04/29/2019- no shower/bed baths with catheter.  12.  Hyperlipidemia.  Crestor 13.  Constipation with hemorrhoids.  Laxative assistance.  Continue Tucks pads  10/12- added Colace 1 tab BID- might need more laxatives- will see. 14. LUE nerve compression- will need EMG after Rehab D/C. 15. Edema- will see if OT can get compression glove for edema control or kineseotaping.  16. Leukocytosis- afebrile and no signs of infection- WBC 12.3 down from 12.6- will recheck Monday or earlier if signs of infection.  10/12- WBC down to 6.9k  LOS: 6 days A FACE TO FACE EVALUATION WAS PERFORMED  Tamryn Popko 05/06/2019, 9:08 AM

## 2019-05-06 NOTE — Progress Notes (Signed)
Occupational Therapy Session Note  Patient Details  Name: Alex Williams MRN: [id number] Date of Birth: [birth year]  Today's Date: 05/06/2019 OT Individual Time: 0910-1015 OT Individual Time Calculation (min): 65 min    Short Term Goals: Week 1:  OT Short Term Goal 1 (Week 1): STGs equal to LTGs set at overall modified independent level overall.  Skilled Therapeutic Interventions/Progress Updates:    Pt began session with transfer down to the dayroom for work on BLE strengthening with use of the Nustep.  He was able to complete 3 sets of 2.5-3 mins with resistance on level 5.  He was able to maintain an average of 54 steps per minute.  Limited use of his UEs secondary to history of left wrist pain as well as his sternal precautions.  Oxygen sats at 97% on room air with HR at 75 post exercise.  Next, had pt sit at the high/low table and work on left hand strengthening with use of the resistive clothespins.  He was able to use a three jaw chuck pinch for picking up and placing the yellow, red, and green clothespins with rest breaks after every 4-5.  He was unable to pinch the blue ones to place on the vertical bar but he could remove them once therapist placed them.  Next had him work on balling up a washcloth on the table and then straightening it out, with focus on digit flexion and digit extension.  Pt still with limited extension at MP joints of digits 3-5 but improving.  Significantly reduced pain in the left wrist from yesterday with use of the kinesiotape and positioning.    Therapy Documentation Precautions:  Precautions Precautions: Sternal, Fall Precaution Comments: nerve impairment in the left hand Restrictions Weight Bearing Restrictions: No RUE Weight Bearing: Non weight bearing RUE Partial Weight Bearing Percentage or Pounds: sternal LUE Weight Bearing: Non weight bearing LUE Partial Weight Bearing Percentage or Pounds: sternal LLE Weight Bearing: Non weight  bearing  Pain: Pain Assessment Pain Scale: Faces Pain Score: 6  Pain Location: Hand Pain Orientation: Left Pain Descriptors / Indicators: Tingling;Numbness Pain Intervention(s): Medication (See eMAR);Other (Comment)(KT taping) ADL: See Care Tool Section for some details of mobility  Therapy/Group: Individual Therapy  Ibraham Levi OTR/L 05/06/2019, 10:43 AM

## 2019-05-06 NOTE — Progress Notes (Signed)
Occupational Therapy Session Note  Patient Details  Name: Alex Williams MRN: [id number] Date of Birth: 07-15-1951  Today's Date: 05/06/2019 OT Individual Time: 0700-0757 OT Individual Time Calculation (min): 57 min    Short Term Goals: Week 1:  OT Short Term Goal 1 (Week 1): STGs equal to LTGs set at overall modified independent level overall.  Skilled Therapeutic Interventions/Progress Updates:    1:1. Pt received in w/c with 3/10 pain in L hand, but reports taping has improved pain. Pt completes bathing at sit to stand level with set up. Pt with good adherence to sternal precautions. Pt grooms seated at sink for energy conservation with MOD I. Pt dresses with A only to don teds. Pt required to sit EOB to don socks. OT applies K tape to dorsal side of LUE as previous tape falling off. Pt completes functional mobilty to raised bed height with no rails to simulate home set up. Pt transfers, rolls B and supine<>sit with S and use of RW. Exited session with pt seated in w/c, call light in reach and al needs met  Therapy Documentation Precautions:  Precautions Precautions: Sternal, Fall Precaution Comments: reviewed precautions with patient  Restrictions Weight Bearing Restrictions: No RUE Weight Bearing: Non weight bearing RUE Partial Weight Bearing Percentage or Pounds: sternal LUE Weight Bearing: Non weight bearing LUE Partial Weight Bearing Percentage or Pounds: sternal LLE Weight Bearing: Non weight bearing General:   Vital Signs: Therapy Vitals Temp: 98.3 F (36.8 C) Temp Source: Oral Pulse Rate: 72 Resp: 16 BP: (!) 133/54 Patient Position (if appropriate): Lying Oxygen Therapy SpO2: 97 % O2 Device: Room Air Pain:   ADL: ADL Eating: Independent Where Assessed-Eating: Wheelchair Grooming: Minimal assistance Where Assessed-Grooming: Edge of bed Upper Body Bathing: Supervision/safety Where Assessed-Upper Body Bathing: Edge of bed Lower Body Bathing: Minimal  assistance Where Assessed-Lower Body Bathing: Edge of bed Upper Body Dressing: Supervision/safety Where Assessed-Upper Body Dressing: Edge of bed Lower Body Dressing: Minimal assistance Where Assessed-Lower Body Dressing: Edge of bed Toileting: Minimal assistance Where Assessed-Toileting: Glass blower/designer: Psychiatric nurse Method: Counselling psychologist: Raised toilet seat Vision   Perception    Praxis   Exercises:   Other Treatments:     Therapy/Group: Individual Therapy  Tonny Branch 05/06/2019, 7:14 AM

## 2019-05-06 NOTE — Progress Notes (Signed)
Nutrition Follow-up  DOCUMENTATION CODES:   Obesity unspecified  INTERVENTION:   - Continue Magic cup TID with meals, each supplement provides 290 kcal and 9 grams of protein  - Continue Pro-stat 30 ml BID, each supplement provides 100 kcal and 15 grams of protein  - Encourage adequate PO intake  - Rena-vit daily  NUTRITION DIAGNOSIS:   Increased nutrient needs related to chronic illness as evidenced by estimated needs.  Ongoing, being addressed via oral nutrition supplements  GOAL:   Patient will meet greater than or equal to 90% of their needs  Progressing  MONITOR:   PO intake, Supplement acceptance, Labs, Weight trends, Skin, I & O's  REASON FOR ASSESSMENT:   Malnutrition Screening Tool    ASSESSMENT:   68 year old male with PMH of ESRD recently started on HD, CHF, HLD, HTN, gout, GERD. Presented 04/16/19 with chest pain and palpitations. Echocardiogram performed which showed an ejection fraction of approximately 25%. Suspected cardiogenic shock in setting of atypical atrial flutter with RVR and ischemia. Cardiac catheterization 90% distal left main stenosis into the LCx, totally occluded mid LAD. Initial attempts at angioplasty but unable to restore flow. Pt underwent emergent medius sternotomy and CABG x 3 on 04/16/19. Hemodialysis ongoing after initially receiving CRRT. [place] placed 04/29/19. Pt admitted to CIR on 10/8.  Plan for HD today.  Weight stable compared to admit weight.  Spoke with pt at bedside who reports appetite and PO intake are increasing. Pt states he does not always eat the Magic Cups but likes them when he does. He prefers the orange flavor. Pt endorses taking Pro-stat as ordered.  Meal Completion: 75-100% x last 8 weeks  Medications reviewed and include: Dulcolax, Sensipar, Aranesp, Colace, Pro-stat BID, Protonix, psyllium, Renvela TID with meals, ferric gluconate with HD  Labs reviewed: sodium 134, chloride 96, hemoglobin 6.9  Last HD on  05/04/19 with 2000 ml net UF and post-HD weight of 125.3 kg.  NUTRITION - FOCUSED PHYSICAL EXAM:    Most Recent Value  Orbital Region  No depletion  Upper Arm Region  No depletion  Thoracic and Lumbar Region  No depletion  Buccal Region  Mild depletion  Temple Region  Mild depletion  Clavicle Bone Region  Mild depletion  Clavicle and Acromion Bone Region  Mild depletion  Scapular Bone Region  No depletion  Dorsal Hand  No depletion  Patellar Region  No depletion  Anterior Thigh Region  No depletion  Posterior Calf Region  No depletion  Edema (RD Assessment)  Mild [BLE]  Hair  Reviewed  Eyes  Reviewed  Mouth  Reviewed  Skin  Reviewed  Nails  Reviewed       Diet Order:   Diet Order            Diet Heart Room service appropriate? Yes; Fluid consistency: Thin  Diet effective now              EDUCATION NEEDS:   Education needs have been addressed  Skin:  Skin Assessment: Skin Integrity Issues: Skin Integrity Issues: Incisions: chest, right leg  Last BM:  05/04/19  Height:   Ht Readings from Last 1 Encounters:  04/20/19 6' (1.829 m)    Weight:   Wt Readings from Last 1 Encounters:  05/06/19 129 kg    Ideal Body Weight:  80.9 kg  BMI:  Body mass index is 38.57 kg/m.  Estimated Nutritional Needs:   Kcal:  2300-2500  Protein:  130-150 grams  Fluid:  1000 ml +  UOP    Gaynell Face, MS, RD, LDN Inpatient Clinical Dietitian Pager: [phone] Weekend/After Hours: [phone]

## 2019-05-06 NOTE — Progress Notes (Addendum)
Physical Therapy Session Note  Patient Details  Name: Alex Williams MRN: [id number] Date of Birth: [birth year]  Today's Date: 05/06/2019 PT Individual Time: 1030-1130 PT Individual Time Calculation (min): 60 min   Short Term Goals: Week 1:  PT Short Term Goal 1 (Week 1): STG=LTG   LTG re: ambulation in controlled environment; distance decreased due to pt's poor activity tolerance LTG re: stairs; discontinued due to pt's poor activity tolerance, and as pt does not have to negotiate steps to enter sister's house     Skilled Therapeutic Interventions/Progress Updates:   Pt asleep in bed, but easily awakened.  He reported that his L hand is really bothering him, and that he was tired from previous OT session.  Supine> sit with supervision in bed with HOB raised slightly, no rails. No c/o dizziness.  Sit> stand to RW, pivot to w/c with supervision. Min cues to observe sternal precautions and avoid use of UEs. By end of session, no cues needed for sternal precautions for sit>< stand, supervision.  Gait training on level tile with RW through obstacle course, x 80' total with CGA and cues to walker slower during this balance challenge.  Gait ttraining x 30' hallway> room with close supervision.   neuromuscular re-education via demo, multimodal cues for seated HEP using hand out: 10 x 2 R/L hip flexion,  long arc quad knee extension, ankle DF/PF, bil adductor squeezes, bil glut sets.  Also 10 x 2 trunk extension/flexion with arms across chest.  Sustained stretch bil heel cords and hamstrings stretch, and balance challenge, x 2 minutes with forefeet on blue wedge, bil UE support.  Pt able to balance x 30 seconds without UE support.  Pt needed TCs to activate hip flexors to initiate hip strategy.  Up/down 5" high curb/step with RW, CGA, min cues.  Pt reported that his sister's house has a strip/threshold  (which is low), to enter house,.   At end of session, pt resting in bed with needs at hand  and bed alarm set.     Therapy Documentation Precautions:  Precautions Precautions: Sternal, Fall Precaution Comments: nerve impairment in the left hand Restrictions Weight Bearing Restrictions: No RUE Weight Bearing: Non weight bearing RUE Partial Weight Bearing Percentage or Pounds: sternal LUE Weight Bearing: Non weight bearing LUE Partial Weight Bearing Percentage or Pounds: sternal LLE Weight Bearing: Non weight bearing   Pain: Pain Assessment Pain Scale: 0-10 Pain Score: 0-No pain        Therapy/Group: Individual Therapy  Irva Loser 05/06/2019, 5:07 PM

## 2019-05-06 NOTE — Patient Care Conference (Signed)
Inpatient RehabilitationTeam Conference and Plan of Care Update Date: 05/06/2019   Time: 9:40 am    Patient Name: Alex Williams      Medical Record Number: [id number]  Date of Birth: [birth year] Sex: Male         Room/Bed: 4W02C/4W02C-01 Payor Info: Payor: [employer] MEDICARE / Plan: [facility] MEDICARE / Product Type: *No Product type* /    Admit Date/Time:  04/30/2019  6:34 PM  Primary Diagnosis:  Debility  Patient Active Problem List   Diagnosis Date Noted  . Recurrent knee pain 05/01/2019  . 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 Debility 04/30/2019  . ESRD on dialysis ([place])   . Hemodialysis-associated hypotension   . Atrial fibrillation with rapid ventricular response ([place])   . Acute on chronic anemia   . 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 Coronary artery disease 04/16/2019  . Acute ST elevation myocardial infarction (STEMI) involving left anterior descending (LAD) coronary artery ([place])   . Cardiogenic shock ([place])   . Acute ST elevation myocardial infarction (STEMI) of anterior wall ([place])   . Acute on chronic renal failure ([place]) 08/26/2018  . Nonspecific abnormal electrocardiogram (ECG) (EKG) 08/25/2018  . Anemia in CKD (chronic kidney disease) 08/25/2018  . Elevated troponin 08/25/2018  . SOB (shortness of breath) 08/25/2018  . Acute pulmonary edema (H[facility])   . Acute renal failure superimposed on stage 5 chronic kidney disease, not on chronic dialysis ([place]) 02/13/2018  . Morbid obesity with BMI of 45.0-49.9, adult ([place]) 12/03/2013  . Renal mass 12/02/2013  . Lower extremity edema 03/16/2013  . CKD (chronic kidney disease), stage III 12/10/2011  . GOUT 01/30/2010  . Obstructive sleep apnea 01/27/2009  . HYPERCHOLESTEROLEMIA 06/30/2007  . Essential hypertension 06/27/2007  . Osteoarthritis 06/27/2007  . LOW BACK PAIN 06/27/2007  . History of colonic polyps 06/27/2007  . NEPHROLITHIASIS, HX OF 06/27/2007    Expected Discharge Date: Expected  Discharge Date: 05/09/19  Team Members Present: Physician leading conference: Dr. Delice Lesch Social Worker Present: Lennart Pall, LCSW Nurse Present: Rayetta Pigg, RN PT Present: Georjean Mode, PT OT Present: Amy Rounds, OT SLP Present: Jettie Booze, CF-SLP PPS Coordinator present : Gunnar Fusi, SLP     Current Status/Progress Goal Weekly Team Focus  Bowel/Bladder   continent of bowel and bladder  remain continent of bowel and bladder.  Assess toileting needs qshift and PRN   Swallow/Nutrition/ Hydration             ADL's   Min assist overall, mod A toileting  Mod I  ADL/IADL re-training, functional activity tolerance, family education and d/c planning   Mobility   S bed; S/CGA basic transfer wiht RW, CGA gait x up to 80', CGA up/down threshold/curb with RW  downgraded 10/14; mod I bed and basic transfer, mod I household distances x 50' wiht LRAD  activity tolerance, strengthening, balance, HEP- pt ed, gait training, d/c planning   Communication             Safety/Cognition/ Behavioral Observations            Pain   Pain to right hand relieved with new order for lyrica  pain less than 3  assess pain qshift and PRN   Skin   Surgical incision to chest well approximated OTA and right upper thigh sutures intact.  promote proper wound healing a remain free of infection  Assess skin qshift and PRN    Rehab Goals Patient on target to  meet rehab goals: Yes *See Care Plan and progress notes for long and short-term goals.     Barriers to Discharge  Current Status/Progress Possible Resolutions Date Resolved   Nursing                  PT                    OT                  SLP                SW                Discharge Planning/Teaching Needs:  Pt to d/c home with sister and brother-in-law initially.  NA - has mod ind goals.   Team Discussion:  Hand pain - added lyrica;  Transfused yesterday.  Hypotensive; cong b/b but some urgency.  S - set up with ADLs and goals  at mod ind.  Min A with PT amb ~ 15 'and goals for mod ind.  Poor endurance overall.  Revisions to Treatment Plan:  NA    Medical Summary Current Status: ESRD, LE edema; L hand pain due to nerve impingement Weekly Focus/Goal: L hand pain- nerve pain  Barriers to Discharge: Behavior;Decreased family/caregiver support;Hemodialysis;Home enviroment access/layout;Medication compliance;Wound care  Barriers to Discharge Comments: n/a Possible Resolutions to Barriers: work on titrating nerve pain meds in setting of ESRD   Continued Need for Acute Rehabilitation Level of Care: The patient requires daily medical management by a physician with specialized training in physical medicine and rehabilitation for the following reasons: Direction of a multidisciplinary physical rehabilitation program to maximize functional independence : Yes Medical management of patient stability for increased activity during participation in an intensive rehabilitation regime.: Yes Analysis of laboratory values and/or radiology reports with any subsequent need for medication adjustment and/or medical intervention. : Yes   I attest that I was present, lead the team conference, and concur with the assessment and plan of the team.   Volney Reierson 05/07/2019, 7:56 AM   Team conference was held via web/ teleconference due to [place] - 19

## 2019-05-06 NOTE — Progress Notes (Signed)
[facility] Progress Note   Subjective: Resting in bed after therapy. No C/Os. For HD today.   Objective Vitals:   05/05/19 1519 05/05/19 1948 05/06/19 0109 05/06/19 0510  BP: 110/69 (!) 95/47 (!) 124/50 (!) 133/54  Pulse:  76  72  Resp:  16  16  Temp:  98 F (36.7 C)  98.3 F (36.8 C)  TempSrc:  Oral  Oral  SpO2:  96%  97%  Weight:    129 kg   Physical Exam General:Pleasant older male in NAD 123XX123 2/6 systolic M. No JVD. Lungs:CTAB A/P Abdomen:S, NT Extremities:1+ BLE edema Dialysis Access:RIJ TDC Drsg CDI, Maturing R AVF + bruit. Bruising present.   Additional Objective Labs: Basic Metabolic Panel: Recent Labs  Lab 04/29/19 1933 04/30/19 0450 05/04/19 0425 05/04/19 1114  NA 137 138 136 134*  K 4.2 4.2 3.9 4.3  CL 101 100 98 96*  CO2 23 26 27 25   GLUCOSE 108* 84 96 112*  BUN 64* 27* 51* 56*  CREATININE 8.31* 4.85* 8.18* 8.57*  CALCIUM 11.3* 9.9 9.8 9.7  PHOS 7.0*  --   --  4.6   Liver Function Tests: Recent Labs  Lab 04/29/19 1933 05/04/19 0425 05/04/19 1114  AST  --  23  --   ALT  --  19  --   ALKPHOS  --  94  --   BILITOT  --  1.1  --   PROT  --  4.7*  --   ALBUMIN 2.6* 2.2* 2.4*   No results for input(s): LIPASE, AMYLASE in the last 168 hours. CBC: Recent Labs  Lab 04/29/19 1640 04/30/19 0450 05/03/19 1051 05/04/19 0425 05/04/19 1114  WBC 12.6* 12.3*  --  6.9 7.3  NEUTROABS  --  8.6*  --  4.3  --   HGB 7.4* 7.3* 7.0* 6.4* 6.9*  HCT 23.4* 24.2* 22.6* 20.4* 21.9*  MCV 92.5 93.8  --  92.7 92.0  PLT 300 297  --  210 224   Blood Culture No results found for: SDES, SPECREQUEST, CULT, REPTSTATUS  Cardiac Enzymes: No results for input(s): CKTOTAL, CKMB, CKMBINDEX, TROPONINI in the last 168 hours. CBG: No results for input(s): GLUCAP in the last 168 hours. Iron Studies: No results for input(s): IRON, TIBC, TRANSFERRIN, FERRITIN in the last 72 hours. @lablastinr3 @ Studies/Results: No results  found. Medications: . ferric gluconate (FERRLECIT/NULECIT) IV 125 mg (05/04/19 1625)   . sodium chloride   Intravenous Once  . allopurinol  100 mg Oral Daily  . amiodarone  200 mg Oral BID  . apixaban  5 mg Oral BID  . aspirin EC  81 mg Oral Daily  . bisacodyl  10 mg Oral Daily   Or  . bisacodyl  10 mg Rectal Daily  . bisoprolol  2.5 mg Oral Once per day on Sun Tue Thu Sat  . Chlorhexidine Gluconate Cloth  6 each Topical Q0600  . cinacalcet  30 mg Oral Q supper  . darbepoetin (ARANESP) injection - DIALYSIS  60 mcg Intravenous Q Mon-HD  . diclofenac sodium  2 g Topical QID  . docusate sodium  100 mg Oral BID  . feeding supplement (PRO-STAT SUGAR FREE 64)  30 mL Oral BID  . fluticasone  2 spray Each Nare Daily  . heparin  1,000 Units Intravenous Q M,W,F-HD  . hydrocortisone  25 mg Rectal BID  . midodrine  5 mg Oral Q M,W,F-HD  . multivitamin  1 tablet Oral QHS  . pantoprazole  40  mg Oral Daily  . pregabalin  50 mg Oral BID  . psyllium  1 packet Oral BID  . rosuvastatin  10 mg Oral q1800  . sevelamer carbonate  800 mg Oral TID WC  . sodium chloride flush  10-40 mL Intracatheter Q12H  . white petrolatum         Dialysis Orders: [place] MWF - new start, had OP HD on 9/21 and 9/23 prior to admission 3h 132kg 300/600 RUE AVF 2/2 bath Hep none venofer 100 mg ordered each treatment x 4,Hb 8.5 on 9/21 noESA order yet PTH 1366 on 04/15/19  Assessment/Plan: 1.Cardiogenic shock/STEMI - s/p emergent CABG on 9/24. S/p impella removed 04/19/19. Per HF team &CTS.  2. ESRD -on HD MWF. New start w/2OP HD prior to admission. CRRT from 9/25-10/2. OP staff reported difficultly w/AVF cannulation, TDC placed by [facility] on 10/7. OP fistulogram scheduled in 2 weeksw/ [facility]. Next HD 10/14. 3. Anemia of CKD&ABLA - transfuse prn. Hgb6.4 05/04/19 Rec'd 1 unit PRBCs on HD Recheck HGB today. Continue aranesp 78mcg qwk ordered w/HD Monday. tsat 14%, iron course ordered to start 10/12.   4. Secondary hyperparathyroidism -Ca 9.7 C Ca 10.9  OP Phos at goal.  pth 1366 on 9/23. Low Ca bath, Renvela 800mg  TID &sensipar 30mg  qd started on 10/7, may need OP parathyroid scan d/t high Ca &high pth. Will need weekly Ca rechecks. 5. Hypotension/volume -BP currently well controlled. Midodrine titrated down now to just 5mg  pre HD. Tolerating so far. Still with BLE edema. Continue lowering volume as tolerated.  6. CAD s/p CABG x3 on 9/24, ASA 81mg  qd 7. Afib on PO amio & eliquis 8. ID - s/p empiric IV ABX stopped on 10/2. WBC improved to 12.3.  9. Possible urinary retention - bladder scans, I/O cath if necessary 10: Dispo: in rehab  [place].  NP-C 05/06/2019, 12:13 PM  [employer] [phone]

## 2019-05-07 ENCOUNTER — Inpatient Hospital Stay (HOSPITAL_COMMUNITY): Payer: Medicare Other | Admitting: Occupational Therapy

## 2019-05-07 ENCOUNTER — Inpatient Hospital Stay (HOSPITAL_COMMUNITY): Payer: Medicare Other

## 2019-05-07 LAB — CBC
HCT: 25.3 % — ABNORMAL LOW (ref 39.0–52.0)
Hemoglobin: 7.8 g/dL — ABNORMAL LOW (ref 13.0–17.0)
MCH: 28.6 pg (ref 26.0–34.0)
MCHC: 30.8 g/dL (ref 30.0–36.0)
MCV: 92.7 fL (ref 80.0–100.0)
Platelets: 235 10*3/uL (ref 150–400)
RBC: 2.73 MIL/uL — ABNORMAL LOW (ref 4.22–5.81)
RDW: 17 % — ABNORMAL HIGH (ref 11.5–15.5)
WBC: 7.9 10*3/uL (ref 4.0–10.5)
nRBC: 0 % (ref 0.0–0.2)

## 2019-05-07 MED ORDER — MIDODRINE HCL 5 MG PO TABS: 5.0000 mg | ORAL_TABLET | ORAL | 0 refills | Status: DC

## 2019-05-07 MED ORDER — AMIODARONE HCL 200 MG PO TABS: 200.0000 mg | ORAL_TABLET | Freq: Two times a day (BID) | ORAL | 1 refills | Status: DC

## 2019-05-07 MED ORDER — DICLOFENAC SODIUM 1 % TD GEL: 2.0000 g | Freq: Four times a day (QID) | TRANSDERMAL | 0 refills | Status: DC

## 2019-05-07 MED ORDER — BISOPROLOL FUMARATE 5 MG PO TABS: ORAL_TABLET | ORAL | 0 refills | Status: DC

## 2019-05-07 MED ORDER — MIDODRINE HCL 5 MG PO TABS
10.0000 mg | ORAL_TABLET | ORAL | Status: DC
  Administered 2019-05-08: 12:00:00 10 mg via ORAL
  Filled 2019-05-07: qty 2

## 2019-05-07 MED ORDER — PREGABALIN 50 MG PO CAPS: 50.0000 mg | ORAL_CAPSULE | Freq: Two times a day (BID) | ORAL | 1 refills | Status: DC

## 2019-05-07 MED ORDER — BISACODYL 5 MG PO TBEC: 5.0000 mg | DELAYED_RELEASE_TABLET | Freq: Every day | ORAL | Status: DC | PRN

## 2019-05-07 MED ORDER — PSYLLIUM 95 % PO PACK: 1.0000 | PACK | Freq: Two times a day (BID) | ORAL | 1 refills | Status: DC

## 2019-05-07 MED ORDER — MIDODRINE HCL 5 MG PO TABS: 2.5000 mg | ORAL_TABLET | Freq: Two times a day (BID) | ORAL | Status: DC | PRN

## 2019-05-07 MED ORDER — RENA-VITE PO TABS: 1.0000 | ORAL_TABLET | Freq: Every day | ORAL | 0 refills | Status: AC

## 2019-05-07 MED ORDER — ROSUVASTATIN CALCIUM 10 MG PO TABS: 10.0000 mg | ORAL_TABLET | Freq: Every day | ORAL | 1 refills | Status: DC

## 2019-05-07 MED ORDER — APIXABAN 5 MG PO TABS: 5.0000 mg | ORAL_TABLET | Freq: Two times a day (BID) | ORAL | 1 refills | Status: DC

## 2019-05-07 MED ORDER — PANTOPRAZOLE SODIUM 40 MG PO TBEC: 40.0000 mg | DELAYED_RELEASE_TABLET | Freq: Every day | ORAL | 0 refills | Status: DC

## 2019-05-07 MED ORDER — MIDODRINE HCL 10 MG PO TABS: 10.0000 mg | ORAL_TABLET | ORAL | 0 refills | Status: DC

## 2019-05-07 MED ORDER — SEVELAMER CARBONATE 800 MG PO TABS: 800.0000 mg | ORAL_TABLET | Freq: Three times a day (TID) | ORAL | 0 refills | Status: DC

## 2019-05-07 MED ORDER — CINACALCET HCL 30 MG PO TABS: 30.0000 mg | ORAL_TABLET | Freq: Every day | ORAL | 0 refills | Status: DC

## 2019-05-07 MED ORDER — TRAMADOL HCL 50 MG PO TABS: 50.0000 mg | ORAL_TABLET | Freq: Two times a day (BID) | ORAL | 0 refills | Status: DC | PRN

## 2019-05-07 MED ORDER — ALLOPURINOL 100 MG PO TABS: 100.0000 mg | ORAL_TABLET | Freq: Every day | ORAL | 0 refills | Status: DC

## 2019-05-07 NOTE — Progress Notes (Signed)
Physical Therapy Session Note  Patient Details  Name: Alex Williams MRN: [id number] Date of Birth: [birth year]  Today's Date: 05/07/2019 PT Individual Time: 0800-0900, [id number] PT Individual Time Calculation (min): 60 min , 68 min  Short Term Goals: Week 1:  PT Short Term Goal 1 (Week 1): STG=LTG     Skilled Therapeutic Interventions/Progress Updates:  tx 1:  Pt seated in w/c.  He rated pain L hand 3/10, premedicated.  K tape is still on dorsum of hand.  Pt c/o having dizziness intermittently earlier this AM. Presently, in sittingvBP  112/66, HR70. Pt stated that his RLE felt "weak" this AM.  PT donned TEDs and pt donned shoes with set up.  Gait training with RW on level tile, 2 turns, 47' with CGA.  Pt c/o R knee pain after gait, stating that he had not had topical meds on it this AM or last PM.  Seated strengthening: 3 x 20 adductor squeezes with feet apart and upright posture, throughout session; 1 x 20 trunk extension/flexion sitting on edge of chair, arms across chest. Cues to count aloud to prevent Valsalva.    Sustained stretch bil hamstrings and heel cords, and balance challnege, standing with forefeet on wedge, x 3 minutes, with bil UE support fading to 0UE support.  PT instructed pt in self -stretching R/L hamstrings and heel cords in sitting, using foot stool, x 30 seconds x 1 each.  At end of session, pt seated in w/c with seat pad alarm set and needs at hand.  tx 2:  Pt sitting in w/c.  He stated that he will be getting a transfusion of blood today due to low Hb.  No dizziness or pain reported at this time,  Sit> stand without pushing up on armrests, close supervision.    Therapeutic exercises performed with LE to increase strength for functional mobility. 50 x 1 bil hip adductor squeezes, using Kinetron in sitting at resistance 40 cm/sec x 2 min, x 3 minutes.  Stand pivot w/c> actual bed in ADL apt, RW, without cues for sternal precautions.  Sit> supine on bed with  supervision.  Rolling R/L with supervision.   neuromuscular re-education via multimodal cues and assistance PRN:  In R/Lside lying :L /R hip abduction clam shells with hips and knees flexed, L/R isolated hip extension with flexed knee.  Pt exhausted.  Sit> stand to return to bed requires max assist with light use of bil hands on knees, due to fatigue. Sit> supine with supervision.  Pt left in bed with needs at hand and alarm set, IV nurse coming in. Therapy Documentation Precautions:  Precautions Precautions: Sternal, Fall Precaution Comments: nerve impairment in the left hand Restrictions Weight Bearing Restrictions: No RUE Weight Bearing: Non weight bearing RUE Partial Weight Bearing Percentage or Pounds: sternal LUE Weight Bearing: Non weight bearing LUE Partial Weight Bearing Percentage or Pounds: sternal LLE Weight Bearing: Non weight bearing       Therapy/Group: Individual Therapy  Tavarus Poteete 05/07/2019, 10:54 AM

## 2019-05-07 NOTE — Progress Notes (Signed)
Social Work Patient ID: Alex Williams, male   DOB: 11-06-1950, 68 y.o.   MRN: [id number]  Have reviewed team conference with pt and sister. Both aware and agreeable with plan for d/c on 10/17.  Still planning to d/c home with sister.  Continue to follow.   Dezyrae Kensinger, LCSW

## 2019-05-07 NOTE — Discharge Summary (Signed)
Physician Discharge Summary  Patient ID: Alex Williams MRN: [id number] DOB/AGE: Mar 22, 1951 68 y.o.  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 EXAM: PORTABLE CHEST 1 VIEW COMPARISON:  04/28/2019 FINDINGS: 1342 hours. Low volume film. The cardio pericardial silhouette is enlarged. There is pulmonary vascular congestion without overt pulmonary edema. No pneumothorax or pleural effusion. Left subclavian central line tip overlies the left innominate vein near the innominate vein confluence. Right IJ central line tip overlies the upper right atrium. The visualized bony structures of the thorax are intact. Telemetry leads overlie the chest. IMPRESSION: 1. Low volume film with pulmonary vascular congestion. 2. Right IJ central line tip overlies the upper right atrium. Stable left subclavian line. Electronically Signed   By: Misty Stanley M.D.   On: 04/29/2019 15:34   Dg Chest Port 1 View  Result Date: 04/28/2019 CLINICAL DATA:  Status post coronary artery bypass graft. EXAM: PORTABLE CHEST 1 VIEW COMPARISON:  Radiograph of April 23, 2019. FINDINGS: Stable cardiomegaly. Sternotomy wires are noted. Stable position of left subclavian catheter. Right internal jugular catheter is noted. No pneumothorax or pleural effusion is  noted. Left lung is clear. Mild right basilar atelectasis is noted. Bony thorax is unremarkable. IMPRESSION: Mild right basilar subsegmental atelectasis. Bilateral internal jugular catheters are noted. Electronically Signed   By: Marijo Conception M.D.   On: 04/28/2019 10:07   Dg Chest Port 1 View  Result Date: 04/23/2019 CLINICAL DATA:  Pneumothorax, recent CABG, chest pain. EXAM: PORTABLE CHEST 1 VIEW COMPARISON:  04/22/2019 FINDINGS: Heart size remains enlarged following median sternotomy and CABG. Cardiomediastinal contours are stable. Right IJ central venous catheter terminates in the mid superior vena cava. Left subclavian catheter terminates at the brachiocephalic junction. Lung volumes remain low. No visible pneumothorax on this single portable view. Persistent basilar opacities and vascular congestion. No acute bone process. IMPRESSION: Right-sided line may have been exchanged since prior study and appears to been retracted slightly since previous exam though this could be due to projection. Otherwise stable evaluation. Electronically Signed   By: Zetta Bills M.D.   On: 04/23/2019 09:13   Dg Chest Port 1 View  Result Date: 04/22/2019 CLINICAL DATA:  Central line retracted. EXAM: PORTABLE CHEST 1 VIEW COMPARISON:  04/20/2019 FINDINGS: Stable enlarged cardiac silhouette and post CABG changes. The right jugular porta catheter tip is unchanged in the region of the superior cavoatrial junction. The left subclavian catheter is unchanged with its tip in the proximal superior vena cava. The previously demonstrated possible femoral Swan-Ganz catheter or overlying wire/tubing is no longer demonstrated. Mild bibasilar atelectasis with improvement. No acute bony abnormality. IMPRESSION: 1. No acute abnormality. 2. Mild bibasilar atelectasis with improvement. 3. Stable cardiomegaly. Electronically Signed   By: Claudie Revering M.D.   On: 04/22/2019 13:44   Dg Chest Port 1 View  Result Date: 04/20/2019 CLINICAL  DATA:  Status post left  subclavian central line placement today. EXAM: PORTABLE CHEST 1 VIEW COMPARISON:  Single-view of the chest 04/18/2019. FINDINGS: New left subclavian central venous catheter is in place with its tip projecting in the mid to upper superior vena cava. Impella device is no longer seen. Likely Swan-Ganz catheter from an inferior approach is looped in the right ventricle with its tip projecting retrograde. Mediastinal drain and left chest tube remain in place. There is no pneumothorax. Cardiomegaly and mild interstitial edema are seen. There is basilar atelectasis. The patient is status post CABG. IMPRESSION: Left subclavian catheter tip projects in the mid to upper superior vena cava. No pneumothorax. Likely Swan-Ganz catheter from an inferior approach now appears looped with its tip projecting retrograde in the right ventricle. Interstitial pulmonary edema and bibasilar atelectasis. These results were called by telephone at the time of interpretation on 04/20/2019 at 11:15 am to the patient's nurse, Thayer Headings, who verbally acknowledged these results. Electronically Signed   By: Inge Rise M.D.   On: 04/20/2019 11:16   Dg Chest Port 1 View  Result Date: 04/18/2019 CLINICAL DATA:  Verify Impella placement. EXAM: PORTABLE CHEST 1 VIEW COMPARISON:  Radiograph yesterday at 6:40 p.m. FINDINGS: Tip of the right internal jugular catheter/sheath in the lower IVC. Impella device in place tip projecting over the ventricular apex, radiopaque marker in the ascending aorta. Probable Swan-Ganz catheter from an inferior approach tip in the region the main pulmonary outflow tract. Suspected mediastinal drains and left chest tube unchanged. Unchanged cardiomegaly and mediastinal contours. Suspect left pleural effusion without visualized pneumothorax. Slight improvement in pulmonary edema. Increased right infrahilar atelectasis. IMPRESSION: 1. Impella device in appropriate position. Additional support  apparatus unchanged. 2. Slight improvement in pulmonary edema from yesterday. 3. Increased right infrahilar atelectasis. Suspect small left pleural effusion. Electronically Signed   By: Keith Rake M.D.   On: 04/18/2019 01:32   Dg Chest Port 1 View  Result Date: 04/17/2019 CLINICAL DATA:  Hemodialysis catheter placement EXAM: PORTABLE CHEST 1 VIEW COMPARISON:  Earlier same day FINDINGS: Dual lumen right internal jugular catheter tip at the SVC RA junction. Aortic device remains in place with tip in the ascending aorta. Inferiorly place 1 Ganz catheter has its tip in the right main pulmonary artery. Possible mediastinal drain. Left thoracostomy tube. Slightly less pulmonary edema and better pulmonary aeration. No pneumothorax. IMPRESSION: Lines and tubes well positioned as above. Slightly less pulmonary edema. No pneumothorax. Electronically Signed   By: Nelson Chimes M.D.   On: 04/17/2019 18:55   Dg Chest Port 1 View  Result Date: 04/17/2019 CLINICAL DATA:  Catheter placement EXAM: PORTABLE CHEST 1 VIEW COMPARISON:  None. FINDINGS: There is been interval placement of a non tunneled right-sided dialysis catheter. The tip terminates near the cavoatrial junction. There is no right-sided pneumothorax. The patient is status post prior median sternotomy. The heart size remains enlarged. Again noted is an impella which appears grossly stable from prior study. There is a left-sided chest tube projecting over the left chest wall. Small bilateral pleural effusions and bibasilar atelectasis is again noted. IMPRESSION: 1. Interval placement of a right-sided dialysis catheter without evidence for a right-sided pneumothorax. 2. Otherwise, stable appearance of the chest. Electronically Signed   By: Constance Holster M.D.   On: 04/17/2019 11:06   Dg Chest Port 1 View  Result Date: 04/17/2019 CLINICAL DATA:  Chest tube EXAM: PORTABLE CHEST 1 VIEW COMPARISON:  Yesterday FINDINGS: Tracheal and esophageal extubation.  Unchanged positioning of Impella device with tip over the  cardiac apex. Chest tubes remain in place. Cardiomegaly and vascular congestion with atelectasis and probable layering pleural fluid. No pneumothorax. IMPRESSION: 1. Unremarkable hardware positioning, including Impella. 2. Extubation with stable vascular congestion and atelectasis. Electronically Signed   By: Monte Fantasia M.D.   On: 04/17/2019 08:48   Dg Chest Port 1 View  Result Date: 04/16/2019 CLINICAL DATA:  Postop CABG EXAM: PORTABLE CHEST 1 VIEW COMPARISON:  04/16/2019 FINDINGS: Changes of CABG. Endotracheal tube is in place. The carina is difficult to visualize. The tip appears to be above the carina. Left chest tube in place without pneumothorax. NG tube tip is in the distal esophagus. Cardiomegaly with vascular congestion. Bilateral effusions and bibasilar atelectasis. IMPRESSION: Postoperative changes from CABG. Left chest tube in place without pneumothorax. Carina is difficult to visualize but the endotracheal tube appears to be above the carina. Cardiomegaly, vascular congestion. Layering effusions with bibasilar atelectasis. No pneumothorax. Electronically Signed   By: Rolm Baptise M.D.   On: 04/16/2019 18:19   Dg Chest Port 1 View  Result Date: 04/16/2019 CLINICAL DATA:  Chest pain EXAM: PORTABLE CHEST 1 VIEW COMPARISON:  February 05, 2019 FINDINGS: The heart is enlarged with pulmonary vascularity within normal limits. There is no edema or consolidation. No adenopathy. There is aortic atherosclerosis. There is arthropathy in each shoulder. IMPRESSION: Persistent cardiac enlargement. No edema or consolidation. Pulmonary vascularity within normal limits. Aortic Atherosclerosis (ICD10-I70.0). Electronically Signed   By: Lowella Grip III M.D.   On: 04/16/2019 08:33   Dg Abd Portable 1v  Result Date: 04/16/2019 CLINICAL DATA:  OG tube placement EXAM: PORTABLE ABDOMEN - 1 VIEW COMPARISON:  None. FINDINGS: OG tube tip is seen in the  distal stomach. IMPRESSION: OG tube tip in the distal stomach. Electronically Signed   By: Rolm Baptise M.D.   On: 04/16/2019 23:58   Dg Fluoro Guide Cv Line-no Report  Result Date: 04/29/2019 Fluoroscopy was utilized by the requesting physician.  No radiographic interpretation.   Vas US Duplex Dialysis Access (avf, Avg)  Result Date: 04/29/2019 DIALYSIS ACCESS Reason for Exam: Unable to dialyze through AVF/AVG. Access Site: Right Upper Extremity. Access Type: Brachial-cephalic AVF. Comparison Study: 03/02/19 Performing Technologist: June Leap RDMS, RVT  Examination Guidelines: A complete evaluation includes B-mode imaging, spectral Doppler, color Doppler, and power Doppler as needed of all accessible portions of each vessel. Unilateral testing is considered an integral part of a complete examination. Limited examinations for reoccurring indications may be performed as noted.  Findings: +--------------------+----------+-----------------+--------+ AVF                 PSV (cm/s)Flow Vol (mL/min)Comments +--------------------+----------+-----------------+--------+ Native artery inflow   300          2216                +--------------------+----------+-----------------+--------+  +------------+----------+-------------+----------+--------+ OUTFLOW VEINPSV (cm/s)Diameter (cm)Depth (cm)Describe +------------+----------+-------------+----------+--------+ Shoulder       243        0.90        0.71            +------------+----------+-------------+----------+--------+ Prox UA                   0.82        0.92            +------------+----------+-------------+----------+--------+ Mid UA         210        0.77        0.27            +------------+----------+-------------+----------+--------+  Dist UA        608        0.77        0.52            +------------+----------+-------------+----------+--------+ AC Fossa       381        0.96        1.30             +------------+----------+-------------+----------+--------+   Summary: Patent Right brachiocephalic fistula. Appears unchanged when compared to previous study.  *See table(s) above for measurements and observations.  Diagnosing physician: Harold Barban MD Electronically signed by Harold Barban MD on 04/29/2019 at 7:02:39 AM.   --------------------------------------------------------------------------------   Final     Labs:  Basic Metabolic Panel: Recent Labs  Lab 05/04/19 0425 05/04/19 1114 05/06/19 1300  NA 136 134* 135  K 3.9 4.3 3.5  CL 98 96* 96*  CO2 27 25 25   GLUCOSE 96 112* 101*  BUN 51* 56* 35*  CREATININE 8.18* 8.57* 5.62*  CALCIUM 9.8 9.7 9.2  PHOS  --  4.6 3.8    CBC: Recent Labs  Lab 05/04/19 0425 05/04/19 1114 05/06/19 1300 05/07/19 1534  WBC 6.9 7.3 5.1 7.9  NEUTROABS 4.3  --   --   --   HGB 6.4* 6.9* 7.1* 7.8*  HCT 20.4* 21.9* 23.7* 25.3*  MCV 92.7 92.0 92.6 92.7  PLT 210 224 214 235    CBG: No results for input(s): GLUCAP in the last 168 hours.  Family history.  Sister with diabetes, hyperlipidemia and sleep apnea.  Denies any colon cancer  Brief HPI:   Sebasthian A Dardis is a 68 y.o. right-handed male with history of chronic kidney disease on hemodialysis, systolic congestive heart failure, hyperlipidemia, hypertension, low back pain.  Lives alone independent prior to admission.  Plans to stay with his discharge sister on discharge.  Presented 04/16/2019 with chest pain and palpitations.  Echocardiogram showed an ejection fraction 25%.  Suspect cardiogenic shock in the setting of atypical atrial flutter with RVR and ischemia.  Cardiac catheterization 90% distal left main stenosis into the Lcx, totally occluded mid LAD, initial attempted angioplasty unable to restore flow.  Underwent emergent sternotomy CABG x3 04/16/2019 per Dr. Kipp Brood.  Hospital course pain management hemodialysis ongoing he did receive a tunneled dialysis catheter 04/29/2019.  Acute on chronic  anemia 7.4 transfuse 1 unit packed red blood cells 04/27/2019.  Maintained on Eliquis as well as low-dose aspirin.  Cardiac rate remained controlled continue on amiodarone per cardiology services.  Close monitoring of blood pressure remained on ProAmatine.  Tolerating a regular diet.  Patient was admitted for a comprehensive rehab program.   Hospital Course: Brady A Posadas was admitted to rehab 04/30/2019 for inpatient therapies to consist of PT, ST and OT at least three hours five days a week. Past admission physiatrist, therapy team and rehab RN have worked together to provide customized collaborative inpatient rehab.  Pertaining to patient's debility after CABG x3 sternal precautions he would follow-up with cardiothoracic surgery.  He remained on Eliquis and aspirin no bleeding episodes.  Pain managed with use of oxycodone and Ultram suggested use of tramadol for hand until nerve pain improved as well as consideration for outpatient EMG.  He was also started on low-dose Lyrica.  Acute on chronic anemia no bleeding episodes he was transfused 1 unit packed red blood cells 05/05/2019.  Atrial fibrillation with RVR amiodarone 200 mg twice daily follow-up cardiology services monitoring for hypotension remained  on ProAmatine.  Crestor ongoing for hyperlipidemia.  Bouts of constipation laxative assistance as advised.  As noted with left upper extremity nerve compression advised outpatient EMG after discharge from rehab services.   Blood pressures were monitored on TID basis and monitored closely   He/ is continent of bowel and bladder.  He/ has made gains during rehab stay and is attending therapies  He/ will continue to receive follow up therapies   after discharge  Rehab course: During patient's stay in rehab weekly team conferences were held to monitor patient's progress, set goals and discuss barriers to discharge. At admission, patient required minimal assist to ambulate 32 feet rolling walker, minimal  assist stand pivot transfers, supervision side-lying to sitting.  Minimal assist lower body bathing set up upper body bathing minimal assist upper body bathing moderate assist lower body dressing  Physical exam.  Blood pressure 116/76 pulse 87 temperature 98.4 respirations 18 oxygen saturations 100% room air Constitutional well-developed HEENT Head.  Normocephalic and atraumatic Eyes.  Pupils round and reactive to light no discharge without nystagmus Neck.  Supple nontender no tracheal deviation no JVD Respiratory.  Effort normal no respiratory distress without wheeze GI.  Exhibits no distention nontender without rebound Musculoskeletal +1 edema bilateral lower extremities and left upper extremity Neurological alert follows commands cooperative right upper extremity 5 out of 5 proximal distal left upper extremity 7/5 proximal distal bilateral lower extremities 4- 4 out of 5 proximal distal Skin.  Midline chest incision clean and dry  He/  has had improvement in activity tolerance, balance, postural control as well as ability to compensate for deficits. He/ has had improvement in functional use RUE/LUE  and RLE/LLE as well as improvement in awareness.  Working with energy conservation techniques.  Gait training with rolling walker on level tile 54 feet contact-guard assist.  Sit to stand without pushing up on armrest close supervision.  Stand pivot wheelchair actual bed and ADL apartment without cues for sternal precautions.  Continues to need some minimal assist to complete toilet hygiene during sessions.  He is able to transfer out to the sink from toilet complete bathing and dressing sit to stand.  Supervision for all upper body bathing and dressing minimal assist needed for thoroughly cleaning his buttocks and washing his feet.       Disposition: Discharge disposition: 01-Home or Self Care     Discharge to home   Diet: Renal diet  Special Instructions: No driving smoking or  alcohol  Sternal precautions  Continue hemodialysis as directed  Medications at discharge. 1.  Allopurinol 100 mg p.o. daily 2.  Amiodarone 200 mg p.o. twice daily 3.  Eliquis 5 mg p.o. twice daily 4.  Aspirin 81 mg p.o. daily 5.  Zebeta 2.5 mg Tuesday Thursday Saturday Sunday 6.  Sensipar 30 mg p.o. daily 7.  Voltaren 2 g 4 times a day to affected area 8.  Colace 100 mg p.o. twice daily 9.  Anusol cream as needed 10.  Flonase 2 sprays each nostril daily 11.  ProAmatine 10 mg Monday Wednesday Friday 12.  Rena-Vite 1 tablet nightly 13.  Tramadol 50 mg every 12 hours 14.  Protonix 40 mg p.o. daily 15.  Lyrica 50 mg p.o. twice daily 16.  Metamucil 1 packet twice daily 17.  Crestor 10 mg p.o. daily 18.  Renvela 800 mg 3 times daily    Discharge Instructions    Amb Referral to Cardiac Rehabilitation   Complete by: As directed    Diagnosis: CABG  CABG X ___: 3   After initial evaluation and assessments completed: Virtual Based Care may be provided alone or in conjunction with Phase 2 Cardiac Rehab based on patient barriers.: No     Allergies as of 05/08/2019      Reactions   Codeine Phosphate Other (See Comments)   Hyperactive       Medication List    STOP taking these medications   acetaminophen 500 MG tablet Commonly known as: TYLENOL   bisacodyl 5 MG EC tablet Commonly known as: DULCOLAX   Darbepoetin Alfa 60 MCG/0.3ML Sosy injection Commonly known as: ARANESP   febuxostat 40 MG tablet Commonly known as: ULORIC   ferric gluconate 125 mg in sodium chloride 0.9 % 100 mL   heparin 1000 UNIT/ML injection   hydrocortisone 2.5 % rectal cream Commonly known as: ANUSOL-HC   lactated ringers infusion   menthol-cetylpyridinium 3 MG lozenge Commonly known as: CEPACOL   ondansetron 4 MG/2ML Soln injection Commonly known as: ZOFRAN   sodium chloride flush 0.9 % Soln Commonly known as: NS   witch hazel-glycerin pad Commonly known as: TUCKS     TAKE these  medications   allopurinol 100 MG tablet Commonly known as: ZYLOPRIM Take 1 tablet (100 mg total) by mouth daily.   amiodarone 200 MG tablet Commonly known as: PACERONE Take 1 tablet (200 mg total) by mouth 2 (two) times daily.   apixaban 5 MG Tabs tablet Commonly known as: ELIQUIS Take 1 tablet (5 mg total) by mouth 2 (two) times daily.   aspirin 81 MG EC tablet Take 1 tablet (81 mg total) by mouth daily.   B-complex with vitamin C tablet Take 1 tablet by mouth daily.   bisoprolol 5 MG tablet Commonly known as: ZEBETA 1 tab Sunday Tuesday Thursday Saturday What changed:   how much to take  how to take this  when to take this  additional instructions   cinacalcet 30 MG tablet Commonly known as: SENSIPAR Take 1 tablet (30 mg total) by mouth daily with supper.   diclofenac sodium 1 % Gel Commonly known as: VOLTAREN Apply 2 g topically 4 (four) times daily.   fluticasone 50 MCG/ACT nasal spray Commonly known as: FLONASE Place 2 sprays into both nostrils daily. What changed:   when to take this  reasons to take this   midodrine 10 MG tablet Commonly known as: PROAMATINE Take 1 tablet (10 mg total) by mouth every Monday, Wednesday, and Friday with hemodialysis. What changed:   medication strength  how much to take  when to take this   multivitamin Tabs tablet Take 1 tablet by mouth at bedtime.   pantoprazole 40 MG tablet Commonly known as: PROTONIX Take 1 tablet (40 mg total) by mouth daily.   pregabalin 50 MG capsule Commonly known as: LYRICA Take 1 capsule (50 mg total) by mouth 2 (two) times daily.   psyllium 95 % Pack Commonly known as: HYDROCIL/METAMUCIL Take 1 packet by mouth 2 (two) times daily.   rosuvastatin 10 MG tablet Commonly known as: CRESTOR Take 1 tablet (10 mg total) by mouth daily at 6 PM.   sevelamer carbonate 800 MG tablet Commonly known as: RENVELA Take 1 tablet (800 mg total) by mouth 3 (three) times daily with meals.    traMADol 50 MG tablet Commonly known as: ULTRAM Take 1 tablet (50 mg total) by mouth every 12 (twelve) hours as needed for moderate pain.      Follow-up Information    Lovorn, Jinny Blossom,  MD Follow up.   Specialty: Physical Medicine and Rehabilitation Why: only as needed Contact information: [address] [place] [address] [phone]        Lajuana Matte, MD Follow up.   Specialty: Thoracic Surgery Why: Call for appointment Contact information: [address] [place] [address] [phone]        Corliss Parish, MD Follow up.   Specialty: Nephrology Why: Call for appointment Contact information: [place] [address] [phone]        [facility] Follow up on 05/22/2019.   Specialty: Cardiology Why: Follow-up in Dr. Claris Gladden office with his PA/NP 10/30 @ 11:00 AM. Parking garage code [employer] information: [address] [id number] [place] [phone]          Signed: Lavon Paganini [place] 05/08/2019, 5:16 AM

## 2019-05-07 NOTE — Progress Notes (Signed)
Occupational Therapy Session Note  Patient Details  Name: Alex Williams MRN: [id number] Date of Birth: Mar 09, 1951  Today's Date: 05/07/2019 OT Individual Time: 1004-1103 OT Individual Time Calculation (min): 59 min    Short Term Goals: Week 1:  OT Short Term Goal 1 (Week 1): STGs equal to LTGs set at overall modified independent level overall.  Skilled Therapeutic Interventions/Progress Updates:    Pt completed toilet transfer with use of the RW for support and close supervision.  He continues to need min assist to complete toilet hygiene during session secondary to not being able to reach efficiently.  He has been working with a toilet aide, but needs more practice.  Once toileting was completed, he was able to transfer out to the sink and complete bathing and dressing sit to stand.  Supervision for all UB bathing and dressing with min assist needed for thoroughly cleaning his buttocks and washing his feet.  Had him use the LH sponge for washing his buttocks and this was fairly efficient, with therapist only checking behind to be sure.  He needed assist with doffing and donning TEDs as well as washing feet secondary to not having a second sponge.  He was able to donn his pants and underpants with min assist as well as his shoes with supervision.  Finished session with application of kinesiotape to the dorsal hand and digits.  Pt left with call button and phone in reach and chair alarm in place.    Therapy Documentation Precautions:  Precautions Precautions: Sternal, Fall Precaution Comments: nerve impairment in the left hand Restrictions Weight Bearing Restrictions: No RUE Weight Bearing: Non weight bearing RUE Partial Weight Bearing Percentage or Pounds: sternal LUE Weight Bearing: Non weight bearing LUE Partial Weight Bearing Percentage or Pounds: sternal LLE Weight Bearing: Non weight bearing  Pain: Pain Assessment Pain Scale: Faces Pain Score: 0-No pain Faces Pain Scale: Hurts a  little bit Pain Type: Acute pain Pain Location: Wrist Pain Orientation: Left Pain Descriptors / Indicators: Discomfort Pain Onset: With Activity ADL: See Care Tool Section for some details for self care and mobility  Therapy/Group: Individual Therapy  Gaberiel Youngblood OTR/L 05/07/2019, 11:19 AM

## 2019-05-07 NOTE — Progress Notes (Signed)
[facility] PHYSICAL MEDICINE & REHABILITATION PROGRESS NOTE   Subjective/Complaints:  Pt reports doing so-so- swimmy headed this AM- BP was 86/52 early this AM- is up somewhat now in PT currently- in gym at [id number] systolic, but still feels swimmy headed- Had HD later yesterday evening.  LBM yesterday am- has to take PO laxatives to go- refusing suppositories.   Review of systems negative for chest pain except for with cough negative for shortness of breath negative for nausea vomiting diarrhea no constipation    Objective:   No results found. Recent Labs    05/06/19 1300  WBC 5.1  HGB 7.1*  HCT 23.7*  PLT 214   Recent Labs    05/06/19 1300  NA 135  K 3.5  CL 96*  CO2 25  GLUCOSE 101*  BUN 35*  CREATININE 5.62*  CALCIUM 9.2    Intake/Output Summary (Last 24 hours) at 05/07/2019 1122 Last data filed at 05/07/2019 0900 Gross per 24 hour  Intake 480 ml  Output 1614 ml  Net -1134 ml     Physical Exam: Vital Signs Blood pressure 122/68, pulse 72, temperature 98 F (36.7 C), temperature source Oral, resp. rate 16, weight 129 kg, SpO2 97 %.  Physical Exam  Vitals and labs reviewed. Constitutional: pt awake, alert, appropriate, sitting in manual w/c in gym, working with PT; kineseotaping of dorsum of L hand still; NADl; no signs of lightheadedness/low BP currently. HENT:  Head: Normocephalic and atraumatic.  Eyes: conjugate gaze.  Neck: No tracheal deviation present. No thyromegaly present CV- rate controlled- irregular rhythm.  Respiratory:  Has fair to good air movement B/L- a little coarse B/L GI: soft, NT, ND, (+BS Musculoskeletal:     Comments: Edema and bilateral lower extremities and left upper extremity- back up to 2-3+ in LEs and trace in L hand TTP over R knee esp over patella L hand- limited extension of fingers Neurological: He is alert.  Follows full commands.   Cooperative with exam. Motor: Right upper extremity: 5/5 proximal  distal Left upper extremity: prox 5/5; hand 2-4/5 - mainly lacking extension Bilateral lower extremities: 4--4/5 proximal distal Difficulty flexing PIPs and DIPs on L hand in 3rd/4th/5th digit- can flex MCPs- slightly improved- can make almost fist and claw today Esp in ulnar distribution, however also mildly affected in median distribution, just not to same extent as ulnar.  Skin:  Midline chest incision C/D/I , no tenderness along the incision Psychiatric: full affect    Assessment/Plan: 1. Functional deficits secondary to debility/ NSTEMI and CABG x 3 vessels and new ESRD/with HD which require 3+ hours per day of interdisciplinary therapy in a comprehensive inpatient rehab setting.  Physiatrist is providing close team supervision and 24 hour management of active medical problems listed below.  Physiatrist and rehab team continue to assess barriers to discharge/monitor patient progress toward functional and medical goals  Care Tool:  Bathing    Body parts bathed by patient: Right arm, Left arm, Chest, Abdomen, Front perineal area, Right upper leg, Face, Left upper leg   Body parts bathed by helper: Right lower leg, Left lower leg, Buttocks     Bathing assist Assist Level: Minimal Assistance - Patient > 75%     Upper Body Dressing/Undressing Upper body dressing   What is the patient wearing?: Pull over shirt    Upper body assist Assist Level: Set up assist    Lower Body Dressing/Undressing Lower body dressing      What is the patient  wearing?: Pants, Underwear/pull up     Lower body assist Assist for lower body dressing: Supervision/Verbal cueing     Toileting Toileting    Toileting assist Assist for toileting: Minimal Assistance - Patient > 75% Assistive Device Comment: (walker)   Transfers Chair/bed transfer  Transfers assist     Chair/bed transfer assist level: Supervision/Verbal cueing     Locomotion Ambulation   Ambulation assist      Assist  level: Supervision/Verbal cueing Assistive device: Walker-rolling Max distance: 10'   Walk 10 feet activity   Assist     Assist level: Contact Guard/Touching assist Assistive device: Walker-rolling   Walk 50 feet activity   Assist    Assist level: Contact Guard/Touching assist Assistive device: Walker-rolling    Walk 150 feet activity   Assist    Assist level: Contact Guard/Touching assist Assistive device: Walker-rolling    Walk 10 feet on uneven surface  activity   Assist           Wheelchair     Assist Will patient use wheelchair at discharge?: No      Wheelchair assist level: Maximal Assistance - Patient 25 - 49%(attempted w/LEs only due to sternal precs.) Max wheelchair distance: 53ft    Wheelchair 50 feet with 2 turns activity    Assist    Wheelchair 50 feet with 2 turns activity did not occur: Safety/medical concerns       Wheelchair 150 feet activity     Assist  Wheelchair 150 feet activity did not occur: Safety/medical concerns       Blood pressure 122/68, pulse 72, temperature 98 F (36.7 C), temperature source Oral, resp. rate 16, weight 129 kg, SpO2 97 %.  Medical Problem List and Plan: 1.  Debility secondary to non-STEMI status post CABG x3 04/16/2019.  Sternal precautions                 CIR PT OT evaluations  10/9- pt also has peripheral neuropathy, maybe due to OR positioning of LUE- will likely need EMG, this can be done as outpatient 2.  Antithrombotics: -DVT/anticoagulation: Eliquis             -antiplatelet therapy: Aspirin 81 mg daily 3. Pain Management: Oxycodone/Ultram as needed  10/9- will order voltaren gel for R knee QID 2g  10/13- suggested tramadol for hand until nerve pain meds kick in, not oxy; also, will start lyrica 50 mg BID- to get into system- then will change to all after HD in a few days.  10/14- pain much improved 4. Mood: Provide emotional support             -antipsychotic agents: N/A 5.  Neuropsych: This patient is capable of making decisions on his own behalf. 6. Skin/Wound Care: Routine skin checks 7. Fluids/Electrolytes/Nutrition: Routine in and outs.  CMP ordered for tomorrow a.m.  10/9- electrolytes look great/stable 8.  Acute on chronic anemia.  CBC ordered for tomorrow a.m.  Continue Aranesp  10/9- Hb stable at 7.3 (was 7.4)  10/12- Hb dropped from 7.0 10/11 to 6.4 this AM- waiting to see if transfused in HD today  10/13- transfused 1 unt pRBCs yesterday in HD.  10/14- Hb up to 6.9 after transfusion 9.  Atrial fibrillation with RVR.  Amiodarone 200 mg twice daily.  Cardiac rate controlled             Monitor with increased physical exertion. 10.  Hypotension.  ProAmatine 5 mg Monday Wednesday Friday  Vitals:   05/07/19 0508 05/07/19 1100  BP: (!) 86/52 122/68  Pulse:  72  Resp:    Temp:    SpO2:    Improved, 10/11  10/15- BP low this AM- Hb only 7.1 and BP on low side- expect he would feel lightheaded- Will add Midodrine 2.5 mg 2x/day prn for SBP <90 AND feeling lightheaded.  11.  ESRD on hemodialysis.  Status post tunneled catheter 04/29/2019- no shower/bed baths with catheter.  12.  Hyperlipidemia.  Crestor 13.  Constipation with hemorrhoids.  Laxative assistance.  Continue Tucks pads  10/12- added Colace 1 tab BID- might need more laxatives- will see. 14. LUE nerve compression- will need EMG after Rehab D/C. 15. LUE Edema- will see if OT can get compression glove for edema control or kineseotaping.  10/15- doing kineseotaping of L hand  16. Leukocytosis- afebrile and no signs of infection- WBC 12.3 down from 12.6- will recheck Monday or earlier if signs of infection.  10/12- WBC down to 6.9k  LOS: 7 days A FACE TO FACE EVALUATION WAS PERFORMED  Bambi Fehnel 05/07/2019, 11:22 AM

## 2019-05-07 NOTE — Progress Notes (Signed)
 [facility] Progress Note   Subjective: Up in chair, eating. HGB down. Patient denies bloody/tarry stools. No C/Os. Tolerated HD well yesterday.     Objective Vitals:   05/06/19 1913 05/07/19 0507 05/07/19 0508 05/07/19 1100  BP: 117/63 (!) 82/47 (!) 86/52 122/68  Pulse: 70 76  72  Resp: 17 16    Temp: 97.8 F (36.6 C) 98 F (36.7 C)    TempSrc:  Oral    SpO2: 100% 97%    Weight:  129 kg     Physical Exam General:Pleasant older male in NAD 123XX123 2/6 systolic M. No JVD. Lungs:CTAB A/P Abdomen:S, NT Extremities:1+ BLE edema Dialysis Access:RIJ TDC Drsg CDI, Maturing R AVF + bruit. Bruising present.   Additional Objective Labs: Basic Metabolic Panel: Recent Labs  Lab 05/04/19 0425 05/04/19 1114 05/06/19 1300  NA 136 134* 135  K 3.9 4.3 3.5  CL 98 96* 96*  CO2 27 25 25   GLUCOSE 96 112* 101*  BUN 51* 56* 35*  CREATININE 8.18* 8.57* 5.62*  CALCIUM 9.8 9.7 9.2  PHOS  --  4.6 3.8   Liver Function Tests: Recent Labs  Lab 05/04/19 0425 05/04/19 1114 05/06/19 1300  AST 23  --   --   ALT 19  --   --   ALKPHOS 94  --   --   BILITOT 1.1  --   --   PROT 4.7*  --   --   ALBUMIN 2.2* 2.4* 2.2*   No results for input(s): LIPASE, AMYLASE in the last 168 hours. CBC: Recent Labs  Lab 05/04/19 0425 05/04/19 1114 05/06/19 1300  WBC 6.9 7.3 5.1  NEUTROABS 4.3  --   --   HGB 6.4* 6.9* 7.1*  HCT 20.4* 21.9* 23.7*  MCV 92.7 92.0 92.6  PLT 210 224 214   Blood Culture No results found for: SDES, SPECREQUEST, CULT, REPTSTATUS  Cardiac Enzymes: No results for input(s): CKTOTAL, CKMB, CKMBINDEX, TROPONINI in the last 168 hours. CBG: No results for input(s): GLUCAP in the last 168 hours. Iron Studies: No results for input(s): IRON, TIBC, TRANSFERRIN, FERRITIN in the last 72 hours. @lablastinr3 @ Studies/Results: No results found. Medications: . sodium chloride    . sodium chloride    . ferric gluconate (FERRLECIT/NULECIT) IV 125 mg  (05/04/19 1625)   . sodium chloride   Intravenous Once  . allopurinol  100 mg Oral Daily  . amiodarone  200 mg Oral BID  . apixaban  5 mg Oral BID  . aspirin EC  81 mg Oral Daily  . bisacodyl  10 mg Oral Daily   Or  . bisacodyl  10 mg Rectal Daily  . bisoprolol  2.5 mg Oral Once per day on Sun Tue Thu Sat  . Chlorhexidine Gluconate Cloth  6 each Topical Q0600  . cinacalcet  30 mg Oral Q supper  . darbepoetin (ARANESP) injection - DIALYSIS  60 mcg Intravenous Q Mon-HD  . diclofenac sodium  2 g Topical QID  . docusate sodium  100 mg Oral BID  . feeding supplement (PRO-STAT SUGAR FREE 64)  30 mL Oral BID  . fluticasone  2 spray Each Nare Daily  . heparin  1,000 Units Intravenous Q M,W,F-HD  . hydrocortisone  25 mg Rectal BID  . midodrine  5 mg Oral Q M,W,F-HD  . multivitamin  1 tablet Oral QHS  . pantoprazole  40 mg Oral Daily  . pregabalin  50 mg Oral BID  . psyllium  1 packet Oral  BID  . rosuvastatin  10 mg Oral q1800  . sevelamer carbonate  800 mg Oral TID WC  . sodium chloride flush  10-40 mL Intracatheter Q12H     Dialysis Orders: [facility] MWF - new start, had OP HD on 9/21 and 9/23 prior to admission 3h 132kg 300/600 RUE AVF 2/2 bath Hep none venofer 100 mg ordered each treatment x 4,Hb 8.5 on 9/21 noESA order yet PTH 1366 on 04/15/19  Assessment/Plan: 1.Cardiogenic shock/STEMI - s/p emergent CABG on 9/24. S/p impella removed 04/19/19. Per HF team &CTS.  2. ESRD -on HD MWF. New start w/2OP HD prior to admission. CRRT from 9/25-10/2. OP staff reported difficultly w/AVF cannulation, TDC placed by [facility] on 10/7. OP fistulogram scheduled in 2 weeksw/ [facility]. Next HD 10/16 3. Anemia of CKD&ABLA - transfuse prn. Hgb6.410/12/20 Rec'd1 unit PRBCs on HD Recheck HGB today. Continue aranesp 8mcg qwk ordered w/HD Monday. tsat 14%, iron course ordered to start 10/12. HGB 7.1 05/06/19. Rechecking CBC today and will transfuse if 7 or less. Denies bloody or tarry  stools. Check FOBT.  4. Secondary hyperparathyroidism -Ca 9.7 C Ca 10.9Phos at goal.pth 1366 on 9/23. Low Ca bath, Renvela 800mg  TID &sensipar 30mg  qd started on 10/7, may need OP parathyroid scan d/t high Ca &high pth. Will need weekly Ca rechecks. 5. Hypotension/volume -BP on soft side, need more volume removed during HD. Increase midodrine to 10 mg PO TIW. Continue lowering volume as tolerated.  6. CAD s/p CABG x3 on 9/24, ASA 81mg  qd 7. Afib on PO amio & eliquis 8. ID - s/p empiric IV ABX stopped on 10/2. WBC improved to 12.3.  9. Possible urinary retention - bladder scans, I/O cath if necessary    H.  NP-C 05/07/2019, 12:37 PM  [employer] [phone]

## 2019-05-08 ENCOUNTER — Inpatient Hospital Stay (HOSPITAL_COMMUNITY): Payer: Medicare Other | Admitting: Occupational Therapy

## 2019-05-08 ENCOUNTER — Inpatient Hospital Stay (HOSPITAL_COMMUNITY): Payer: Medicare Other

## 2019-05-08 MED ORDER — HEPARIN SODIUM (PORCINE) 1000 UNIT/ML IJ SOLN
INTRAMUSCULAR | Status: AC
  Administered 2019-05-08: 1000 [IU] via INTRAVENOUS
  Filled 2019-05-08: qty 4

## 2019-05-08 NOTE — Discharge Instructions (Signed)
Inpatient Rehab Discharge Instructions  Jamesyn A Bellamy Discharge date and time: No discharge date for patient encounter.   Activities/Precautions/ Functional Status: Activity: Sternal precautions Diet: renal diet Wound Care: keep wound clean and dry Functional status:  ___ No restrictions     ___ Walk up steps independently ___ 24/7 supervision/assistance   ___ Walk up steps with assistance ___ Intermittent supervision/assistance  ___ Bathe/dress independently ___ Walk with walker     _x__ Bathe/dress with assistance ___ Walk Independently    ___ Shower independently ___ Walk with assistance    ___ Shower with assistance ___ No alcohol     ___ Return to work/school ________    COMMUNITY REFERRALS UPON DISCHARGE:    Home Health:   PT     OT                      Agency:  [facility]     Phone: [phone]   Medical Equipment/Items Ordered:  Commode                                                      Agency/Supplier:  [employer] @ [id number]     Special Instructions: No driving smoking or alcohol  Continue hemodialysis as directed   My questions have been answered and I understand these instructions. I will adhere to these goals and the provided educational materials after my discharge from the hospital.  Patient/Caregiver Signature _______________________________ Date __________  Clinician Signature _______________________________________ Date __________  Please bring this form and your medication list with you to all your follow-up doctor's appointments.

## 2019-05-08 NOTE — Progress Notes (Signed)
[facility] PHYSICAL MEDICINE & REHABILITATION PROGRESS NOTE   Subjective/Complaints:  Pt reports can now make fist of L hand, but can't extend fingers still- also mentions pain in L neck down LUE to hand- might have compression in neck.  LBM 2 days ago- didn't take Dulcolax- explained it's prn- has to ask for it. So doesn't make have diarrhea. Feels a little constipated.  Review of systems negative for chest pain except for with cough negative for shortness of breath, negative for nausea, vomiting, diarrhea,    Objective:   No results found. Recent Labs    05/06/19 1300 05/07/19 1534  WBC 5.1 7.9  HGB 7.1* 7.8*  HCT 23.7* 25.3*  PLT 214 235   Recent Labs    05/06/19 1300  NA 135  K 3.5  CL 96*  CO2 25  GLUCOSE 101*  BUN 35*  CREATININE 5.62*  CALCIUM 9.2    Intake/Output Summary (Last 24 hours) at 05/08/2019 1318 Last data filed at 05/08/2019 1028 Gross per 24 hour  Intake 390 ml  Output -  Net 390 ml     Physical Exam: Vital Signs Blood pressure (!) 106/43, pulse 75, temperature 98.1 F (36.7 C), resp. rate 19, weight 130.9 kg, SpO2 94 %.  Physical Exam  Vitals and labs reviewed. Constitutional: pt awake, alert, appropriate, sitting in manual w/c in gym, working with PT on car transfers;  kineseotaping of dorsum of L hand still; NAD. HENT:  Head: Normocephalic and atraumatic.  Eyes: conjugate gaze.  Neck: No tracheal deviation present. No thyromegaly present CV- rate controlled- irregular rhythm.  Respiratory:  Has fair to good air movement B/L- a little coarse B/L GI: soft, NT, ND, (+BS Musculoskeletal:     Comments: Edema and bilateral lower extremities and left upper extremity- back up to 2-3+ in LEs and trace in L hand TTP over R knee esp over patella L hand- limited extension of fingers Neurological: He is alert.  Follows full commands.   Cooperative with exam. Motor: Right upper extremity: 5/5 proximal distal Left upper extremity: prox 5/5;  hand 2-4/5 - mainly lacking extension Bilateral lower extremities: 4--4/5 proximal distal Difficulty flexing PIPs and DIPs on L hand in 3rd/4th/5th digit- can flex MCPs- slightly improved- can make fist- can't extend 3rd/4th/5th digits on L hand Esp in ulnar distribution, however also mildly affected in median distribution, just not to same extent as ulnar.  Skin:  Midline chest incision C/D/I , no tenderness along the incision Psychiatric: full affect    Assessment/Plan: 1. Functional deficits secondary to debility/ NSTEMI and CABG x 3 vessels and new ESRD/with HD which require 3+ hours per day of interdisciplinary therapy in a comprehensive inpatient rehab setting.  Physiatrist is providing close team supervision and 24 hour management of active medical problems listed below.  Physiatrist and rehab team continue to assess barriers to discharge/monitor patient progress toward functional and medical goals  Care Tool:  Bathing    Body parts bathed by patient: Right arm, Left arm, Chest, Abdomen, Front perineal area, Right upper leg, Face, Left upper leg   Body parts bathed by helper: Right lower leg, Left lower leg, Buttocks     Bathing assist Assist Level: Minimal Assistance - Patient > 75%     Upper Body Dressing/Undressing Upper body dressing   What is the patient wearing?: Pull over shirt    Upper body assist Assist Level: Set up assist    Lower Body Dressing/Undressing Lower body dressing  What is the patient wearing?: Pants, Underwear/pull up     Lower body assist Assist for lower body dressing: Supervision/Verbal cueing     Toileting Toileting    Toileting assist Assist for toileting: Minimal Assistance - Patient > 75% Assistive Device Comment: (walker)   Transfers Chair/bed transfer  Transfers assist     Chair/bed transfer assist level: Supervision/Verbal cueing     Locomotion Ambulation   Ambulation assist      Assist level:  Supervision/Verbal cueing Assistive device: Walker-rolling Max distance: 10'   Walk 10 feet activity   Assist     Assist level: Contact Guard/Touching assist Assistive device: Walker-rolling   Walk 50 feet activity   Assist    Assist level: Contact Guard/Touching assist Assistive device: Walker-rolling    Walk 150 feet activity   Assist    Assist level: Contact Guard/Touching assist Assistive device: Walker-rolling    Walk 10 feet on uneven surface  activity   Assist     Assist level: Contact Guard/Touching assist     Wheelchair     Assist Will patient use wheelchair at discharge?: No      Wheelchair assist level: Maximal Assistance - Patient 25 - 49%(attempted w/LEs only due to sternal precs.) Max wheelchair distance: 65ft    Wheelchair 50 feet with 2 turns activity    Assist    Wheelchair 50 feet with 2 turns activity did not occur: Safety/medical concerns       Wheelchair 150 feet activity     Assist  Wheelchair 150 feet activity did not occur: Safety/medical concerns       Blood pressure (!) 106/43, pulse 75, temperature 98.1 F (36.7 C), resp. rate 19, weight 130.9 kg, SpO2 94 %.  Medical Problem List and Plan: 1.  Debility secondary to non-STEMI status post CABG x3 04/16/2019.  Sternal precautions                 CIR PT OT evaluations  10/9- pt also has peripheral neuropathy, maybe due to OR positioning of LUE- will likely need EMG, this can be done as outpatient 2.  Antithrombotics: -DVT/anticoagulation: Eliquis             -antiplatelet therapy: Aspirin 81 mg daily 3. Pain Management: Oxycodone/Ultram as needed  10/9- will order voltaren gel for R knee QID 2g  10/13- suggested tramadol for hand until nerve pain meds kick in, not oxy; also, will start lyrica 50 mg BID- to get into system- then will change to all after HD in a few days.  10/16- working well- will send home on current Lyrica dose- explained to pt that  narcotics not helping hand, Lyrica is.  10/14- pain much improved 4. Mood: Provide emotional support             -antipsychotic agents: N/A 5. Neuropsych: This patient is capable of making decisions on his own behalf. 6. Skin/Wound Care: Routine skin checks 7. Fluids/Electrolytes/Nutrition: Routine in and outs.  CMP ordered for tomorrow a.m.  10/9- electrolytes look great/stable 8.  Acute on chronic anemia.  CBC ordered for tomorrow a.m.  Continue Aranesp  10/9- Hb stable at 7.3 (was 7.4)  10/12- Hb dropped from 7.0 10/11 to 6.4 this AM- waiting to see if transfused in HD today  10/13- transfused 1 unt pRBCs yesterday in HD.  10/14- Hb up to 6.9 after transfusion 9.  Atrial fibrillation with RVR.  Amiodarone 200 mg twice daily.  Cardiac rate controlled  Monitor with increased physical exertion. 10.  Hypotension.  ProAmatine 5 mg Monday Wednesday Friday            Vitals:   05/07/19 1929 05/08/19 0607  BP: (!) 99/48 (!) 106/43  Pulse: 71 75  Resp: 19 19  Temp: 98.4 F (36.9 C) 98.1 F (36.7 C)  SpO2: 98% 94%  Improved, 10/11  10/15- BP low this AM- Hb only 7.1 and BP on low side- expect he would feel lightheaded- Will add Midodrine 2.5 mg 2x/day prn for SBP <90 AND feeling lightheaded.  11.  ESRD on hemodialysis.  Status post tunneled catheter 04/29/2019- no shower/bed baths with catheter.  12.  Hyperlipidemia.  Crestor 13.  Constipation with hemorrhoids.  Laxative assistance.  Continue Tucks pads  10/12- added Colace 1 tab BID- might need more laxatives- will see. 14. LUE nerve compression- will need EMG after Rehab D/C.  10/16- slightly improved- can't extend- might need cervical MRI- will see in f/u. 15. LUE Edema- will see if OT can get compression glove for edema control or kineseotaping.  10/15- doing kineseotaping of L hand  16. Leukocytosis- afebrile and no signs of infection- WBC 12.3 down from 12.6- will recheck Monday or earlier if signs of infection.  10/12-  WBC down to 6.9k  LOS: 8 days A FACE TO FACE EVALUATION WAS PERFORMED  Horace Lukas 05/08/2019, 1:18 PM

## 2019-05-08 NOTE — Progress Notes (Signed)
Social Work Discharge Note   The overall goal for the admission was met for:   Discharge location: Yes - home with sister who can provide assistance if needed.  Length of Stay: Yes - 9 days (with discharge on 10/17  Discharge activity level: Yes - mod independent  Home/community participation: Yes  Services provided included: MD, RD, PT, OT, RN, Pharmacy, [place]: [facility] Medicare  Follow-up services arranged: Home Health: PT, OT via [facility], DME: 3n1 commode via [place] [facility] and Patient/Family has no preference for HH/DME agencies  Comments (or additional information):   Contact info:  Pt @ [phone] or sister, [name] @ [phone]  Patient/Family verbalized understanding of follow-up arrangements: Yes  Individual responsible for coordination of the follow-up plan: pt  Confirmed correct DME delivered: Lennart Pall 05/08/2019    Dasja Brase

## 2019-05-08 NOTE — Progress Notes (Signed)
Physical Therapy Session Note  Patient Details  Name: Alex Williams MRN: [id number] Date of Birth: Jul 02, 1951  Today's Date: 05/08/2019 PT Individual Time: 0922-1033 PT Individual Time Calculation (min): 71 min   Short Term Goals: Week 1:  PT Short Term Goal 1 (Week 1): STG=LTG Week 2:    Week 3:     Skilled Therapeutic Interventions/Progress Updates:      Therapy Documentation Precautions:  Precautions Precautions: Sternal, Fall Precaution Comments: nerve impairment in the left hand Restrictions Weight Bearing Restrictions: Yes RUE Weight Bearing: Non weight bearing RUE Partial Weight Bearing Percentage or Pounds: Sternal LUE Weight Bearing: Non weight bearing LUE Partial Weight Bearing Percentage or Pounds: Sternal LLE Weight Bearing: Non weight bearing  Pt initially OOB in wc. Pt initially c/o dizzyness.  BP 116/61, hr 70.  Dizzyness brief and resolved w/rest.    wc propulsion w/bilat LE's x 51ft as warmup activity.  Stair training:  Pt ascended/descended 2steps w single rail on R adhering to "fmove in tube" precautions w/min assist, ascends forward then reversed to descend backwards, leads W LLE which exhibitied mild buckling on second step.  Berg Balance assessment performed and results/score/risk of falls discussed w/patient.  Pt scored 27/56 indicating high risk of falls and need for AD at all times.   Gait 80ft w/RW and supervision for cardiovascular conditioing and general strength.  Discussed energy conservation strategies w/pt. Due to limited endurance, decreased activity tolerance.   Exercises: Reviewed HEP and performed all exercises with only minimal cues for posture w/ marching activity.   Exercises included seated marching, LAQ's, isometric adduction, glut sets, ankle DF/PF, hamstring stretching.  Pt left oob in wc w/needs in reach and nurse in room w/pt.  Pt requires frequent rest breaks due to limited overall endurance.  Difficulty w/stairs at this  time is able to negotiate 2 w/rail and assist.  Significant falls risk as demonstrated by total score of 27/56 on BERG.    Balance: Balance Balance Assessed: Yes Standardized Balance Assessment Standardized Balance Assessment: Berg Balance Test Berg Balance Test Sit to Stand: Able to stand  independently using hands Standing Unsupported: Able to stand 2 minutes with supervision Sitting with Back Unsupported but Feet Supported on Floor or Stool: Able to sit safely and securely 2 minutes Stand to Sit: Sits safely with minimal use of hands Transfers: Able to transfer safely, minor use of hands Standing Unsupported with Eyes Closed: Needs help to keep from falling Standing Ubsupported with Feet Together: Needs help to attain position and unable to hold for 15 seconds From Standing, Reach Forward with Outstretched Arm: Can reach forward >12 cm safely (5") From Standing Position, Pick up Object from Floor: Unable to try/needs assist to keep balance From Standing Position, Turn to Look Behind Over each Shoulder: Looks behind from both sides and weight shifts well Turn 360 Degrees: Needs close supervision or verbal cueing Standing Unsupported, Alternately Place Feet on Step/Stool: Able to complete >2 steps/needs minimal assist Standing Unsupported, One Foot in Front: Loses balance while stepping or standing Standing on One Leg: Unable to try or needs assist to prevent fall Total Score: 27 Static Sitting Balance Static Sitting - Balance Support: Feet supported;No upper extremity supported Static Sitting - Level of Assistance: 7: Independent Dynamic Sitting Balance Dynamic Sitting - Balance Support: During functional activity;Feet supported Dynamic Sitting - Level of Assistance: 7: Independent Static Standing Balance Static Standing - Balance Support: During functional activity;No upper extremity supported Static Standing - Level of Assistance: 6: Modified  independent (Device/Increase  time) Dynamic Standing Balance Dynamic Standing - Balance Support: During functional activity;No upper extremity supported Dynamic Standing - Level of Assistance: 6: Modified independent (Device/Increase time) Dynamic Standing - Balance Activities: Lateral lean/weight shifting;Reaching for objects      Therapy/Group: Individual Therapy  Callie Fielding, [place] 05/08/2019, 12:20 PM

## 2019-05-08 NOTE — Progress Notes (Addendum)
Physical Therapy Discharge Summary  Patient Details  Name: Alex Williams MRN: [id number] Date of Birth: 06-Mar-1951  Today's Date: 05/08/2019 PT Individual Time: 0805-0905 PT Individual Time Calculation (min): 60 min    Patient has met 7 of 7 long term goals due to improved activity tolerance, improved balance, improved postural control, increased strength, increased range of motion, decreased pain and ability to compensate for deficits.  Patient to discharge at an ambulatory level, short distances Modified Independent.   Patient's care partner is independent to provide the necessary physical assistance at discharge.  Recommendation:  Patient will benefit from ongoing skilled PT services in home health setting to continue to advance safe functional mobility, address ongoing impairments in balance, strength, ROM, activity tolerance functional mobility, patient/family, and minimize fall risk.  Equipment: No equipment provided  Reasons for discharge: treatment goals met  Patient/family agrees with progress made and goals achieved: Yes  Skilled Therapeutic Interventions: Patient sitting EOB with RN in room upon PT arrival. Patient alert and agreeable to PT session. Patient reported 3/10 numbness in his L hand and arm along ulnar nerve pattern during session, RN and MD made aware. PT provided repositioning, rest breaks, and distraction as pain interventions throughout session.   Therapeutic Activity: Bed Mobility: Patient performed rolling R/L and supine to/from sit independently with his arms across his chest, except for slight push up to sitting throughout his elbow, to maintain sternal precautions. Transfers: Patient performed sit to/from stand x4 with mod I with minimal use of UEs to push up with 1 hand on the RW.   Gait Training:  Patient ambulated 110 feet using RW with mod I. Ambulated as described below. Demonstrated no buckling or LOB and was able to determine a appropriate time to  sit based on fatigue. RPE 7/10 after. Discussed energy conservation techniques and RPE scale with patient. Patient receptive to education.   Wheelchair Mobility:  Patient was transported in the w/c with total A throughout session for energy conservation and time management. Patient will not use a w/c at d/c.  Patient in w/c in room at end of session with breaks locked, chair alarm set, and all needs within reach. Educated on fall risk/prevention and activation of emergency services in the event of a fall. Also educated on checking BP daily, signs/symptoms of elevated or low BP and at risk BP values. Patient demonstrated safe mobility throughout session and was able to recall and adhere to sternal precautions throughout session.   PT Discharge Precautions/Restrictions Precautions Precautions: Sternal;Fall Restrictions Weight Bearing Restrictions: Yes RUE Weight Bearing: Non weight bearing RUE Partial Weight Bearing Percentage or Pounds: Sternal LUE Weight Bearing: Non weight bearing LUE Partial Weight Bearing Percentage or Pounds: Sternal Vision/Perception  Perception Perception: Within Functional Limits Praxis Praxis: Intact  Cognition Overall Cognitive Status: Within Functional Limits for tasks assessed Arousal/Alertness: Awake/alert Sustained Attention: Appears intact Selective Attention: Appears intact Memory: Appears intact Awareness: Appears intact Problem Solving: Appears intact Safety/Judgment: Appears intact Sensation Sensation Light Touch: Impaired Detail Peripheral sensation comments: left dorsal hand impairment, more affected on the ulnar side and less on the radial side, mimicing ulnar nerve distribution Light Touch Impaired Details: Impaired LUE Proprioception: Appears Intact Coordination Gross Motor Movements are Fluid and Coordinated: No Fine Motor Movements are Fluid and Coordinated: No Coordination and Movement Description: Decreased left hand digit extension  in digits 3-5 at the MPs with decreased opposition of thumb to 4th and 5th digits. Generalized weakness and decreased activity tolerance with functional mobility. Motor  Motor Motor: Other (comment) Motor - Discharge Observations: L hand/wrist weakness and generalized weakness with decreased activity tolerance with functional mobility  Mobility Bed Mobility Bed Mobility: Rolling Right;Rolling Left;Supine to Sit;Sit to Supine Rolling Right: Independent Rolling Left: Independent Supine to Sit: Independent Sit to Supine: Independent Transfers Transfers: Sit to Stand;Stand to Sit;Stand Pivot Transfers Sit to Stand: Independent with assistive device Stand to Sit: Independent with assistive device Stand Pivot Transfers: Independent with assistive device Stand Pivot Transfer Details (indicate cue type and reason): no cues provided Transfer (Assistive device): Rolling walker Locomotion  Gait Gait: Yes Gait Pattern: Impaired Gait Pattern: Step-through pattern;Decreased step length - left;Decreased stance time - right;Decreased hip/knee flexion - right;Decreased hip/knee flexion - left;Decreased stride length;Antalgic;Trunk flexed;Decreased trunk rotation Gait velocity: decreased Stairs / Additional Locomotion Stairs: Yes Stairs Assistance: Minimal Assistance - Patient > 75% Stair Management Technique: One rail Right Number of Stairs: 2 Height of Stairs: 6 Wheelchair Mobility Wheelchair Mobility: No  Trunk/Postural Assessment  Cervical Assessment Cervical Assessment: Exceptions to WFL(forward head) Thoracic Assessment Thoracic Assessment: Exceptions to WFL(rounded shoulders) Lumbar Assessment Lumbar Assessment: Exceptions to WFL(posterior pelvic tilt) Postural Control Postural Control: Deficits on evaluation(decreased/delayed)  Balance Balance Balance Assessed: Yes Standardized Balance Assessment: Berg Balance Test Berg Balance Test Sit to Stand: Able to stand  independently  using hands Standing Unsupported: Able to stand 2 minutes with supervision Sitting with Back Unsupported but Feet Supported on Floor or Stool: Able to sit safely and securely 2 minutes Stand to Sit: Sits safely with minimal use of hands Transfers: Able to transfer safely, minor use of hands Standing Unsupported with Eyes Closed: Needs help to keep from falling Standing Ubsupported with Feet Together: Needs help to attain position and unable to hold for 15 seconds From Standing, Reach Forward with Outstretched Arm: Can reach forward >12 cm safely (5") From Standing Position, Pick up Object from Floor: Unable to try/needs assist to keep balance From Standing Position, Turn to Look Behind Over each Shoulder: Looks behind from both sides and weight shifts well Turn 360 Degrees: Needs close supervision or verbal cueing Standing Unsupported, Alternately Place Feet on Step/Stool: Able to complete >2 steps/needs minimal assist Standing Unsupported, One Foot in Front: Loses balance while stepping or standing Standing on One Leg: Unable to try or needs assist to prevent fall Total Score: 27/56 Static Sitting Balance Static Sitting - Balance Support: Feet supported;No upper extremity supported Static Sitting - Level of Assistance: 7: Independent Dynamic Sitting Balance Dynamic Sitting - Balance Support: During functional activity;Feet supported Dynamic Sitting - Level of Assistance: 7: Independent Static Standing Balance Static Standing - Balance Support: During functional activity;No upper extremity supported Static Standing - Level of Assistance: 6: Modified independent (Device/Increase time) Dynamic Standing Balance Dynamic Standing - Balance Support: During functional activity;No upper extremity supported Dynamic Standing - Level of Assistance: 6: Modified independent (Device/Increase time) Dynamic Standing - Balance Activities: Lateral lean/weight shifting;Reaching for objects Extremity  Assessment  RUE Assessment RUE Assessment: Exceptions to WFL(demonstrates full AROM within sternotomy precs) General Strength Comments: sternotomy, WFL for functional mobility maintaining sternal precautions LUE Assessment LUE Assessment: Exceptions to [facility] Active Range of Motion (AROM) Comments: limitd mass grip motion General Strength Comments: Strength grossly at least 3+/5 throughout, grip 4-/5 RLE Assessment RLE Assessment: Exceptions to [facility] Active Range of Motion (AROM) Comments: WLF for functional mobility General Strength Comments: Grossly in sitting: hip flexion 4/5 otherwise 5/5 throughout LLE Assessment LLE Assessment: Exceptions to [facility] Active Range of Motion (AROM) Comments: WFL for  all functional mobility General Strength Comments: Grossly in sitting: hip flexion 4/5 otherwise 5/5 throughout    Daxon Kyne L Lauris Serviss PT, DPT  05/08/2019, 3:48 PM

## 2019-05-08 NOTE — Progress Notes (Signed)
Occupational Therapy Discharge Summary  Patient Details  Name: Alex Williams MRN: [id number] Date of Birth: [birth year]  Today's Date: 05/08/2019 OT Individual Time: 1105-1200 OT Individual Time Calculation (min): 55 min   Session Note:  Pt worked on [employer], AROM, and strengthening exercises following handout.  He was able to complete wrist extension with 1 lb weight for 20 reps as well as wrist flexion with 2 lb weight for 20 reps.   He exhibits 90% of digit intrinsic abduction as well which was not present last week.   He then used medium resistance therapy putty for exercises of gross digit flexion, lateral pinch, and tip to tip pinch with min instructional cueing.  He completed 2-3 repetitions for each exercise.  He continues to demonstrate decreased digit extension at the MPs and PIPs, with digits 3 and 4 being more impaired than digits 2 and 5.  Therapist also applied kinesiotape to the left dorsal hand as well to assist with pain and proprioceptive input for digit extension.  He completed stand pivot transfers to the therapy mat and bed during session with modified independence using the RW for support.    Patient has met 7 of 9 long term goals due to improved activity tolerance, improved balance, ability to compensate for deficits, functional use of  LEFT upper extremity and improved coordination.  Patient to discharge at [facility] Assist level.  Patient's care partner is independent to provide the necessary physical assistance at discharge.    Reasons goals not met: Pt needs min to mod assist for toilet hygiene as well as LB bathing.    Recommendation:  Patient will benefit from ongoing skilled OT services in home health setting to continue to advance functional skills in the area of BADL, iADL, Vocation and left hand functional use and strength.  Feel pt will benefit from continued [place] for further progression of ADL independence as well as for greater AROM, strength, and functional use  of the LUE.    Equipment: 3:1  Reasons for discharge: treatment goals met and discharge from hospital  Patient/family agrees with progress made and goals achieved: Yes  OT Discharge Precautions/Restrictions  Precautions Precautions: Sternal;Fall Restrictions Weight Bearing Restrictions: No  Pain  Back pain 3/10 on the faces scale, pt repositioned for comfort ADL ADL Eating: Independent Where Assessed-Eating: Wheelchair Grooming: Independent Where Assessed-Grooming: Standing at sink Upper Body Bathing: Independent Where Assessed-Upper Body Bathing: Sitting at sink Lower Body Bathing: Minimal assistance Where Assessed-Lower Body Bathing: Wheelchair Upper Body Dressing: Independent Where Assessed-Upper Body Dressing: Wheelchair Lower Body Dressing: Modified independent Where Assessed-Lower Body Dressing: Wheelchair Toileting: Minimal assistance Where Assessed-Toileting: Bedside Commode Toilet Transfer: Modified independent Armed forces technical officer Method: Counselling psychologist: Bedside commode Vision Baseline Vision/History: Wears glasses Wears Glasses: Reading only Patient Visual Report: No change from baseline Vision Assessment?: No apparent visual deficits Perception  Perception: Within Functional Limits Praxis Praxis: Intact Cognition Overall Cognitive Status: Within Functional Limits for tasks assessed Arousal/Alertness: Awake/alert Orientation Level: Oriented X4 Attention: Sustained Sustained Attention: Appears intact Selective Attention: Appears intact Safety/Judgment: Appears intact Sensation Sensation Light Touch: Impaired Detail Peripheral sensation comments: left dorsal hand impairment, more affected on the ulnar side and less on the radial side, mimicing ulnar nerve distribution Light Touch Impaired Details: Impaired LUE Proprioception: Appears Intact Stereognosis: Impaired Detail Stereognosis Impaired Details: Impaired  LUE Coordination Gross Motor Movements are Fluid and Coordinated: No Fine Motor Movements are Fluid and Coordinated: No Coordination and Movement Description: Pt with decreased left digit  extension at the MPS and PIPs of digits 3-5 as well as decreased thumb extension resulting in decreased FM coordination and functional use.  He currently uses at a diminshed level with selfcare tasks in general. Motor  Motor Motor: Other (comment) Motor - Discharge Observations: Pt still with left wrist and hand weakness with limited AROM digit extension. Mobility  Bed Mobility Bed Mobility: Rolling Right;Rolling Left;Supine to Sit;Sit to Supine Rolling Right: Independent Rolling Left: Independent Supine to Sit: Independent Sitting - Scoot to Edge of Bed: Independent Sit to Supine: Independent Sit to Sidelying Right: Independent Scooting to HOB: Independent Transfers Sit to Stand: Independent with assistive device Stand to Sit: Independent with assistive device  Trunk/Postural Assessment  Cervical Assessment Cervical Assessment: Exceptions to WFL(forward head) Thoracic Assessment Thoracic Assessment: Exceptions to WFL(thoracic kyphosis) Lumbar Assessment Lumbar Assessment: Exceptions to WFL(posterior pelvic tilt)  Balance Balance Balance Assessed: Yes Static Sitting Balance Static Sitting - Balance Support: Feet supported;No upper extremity supported Static Sitting - Level of Assistance: 7: Independent Dynamic Sitting Balance Dynamic Sitting - Balance Support: During functional activity;Feet supported Dynamic Sitting - Level of Assistance: 7: Independent Static Standing Balance Static Standing - Balance Support: During functional activity;No upper extremity supported Static Standing - Level of Assistance: 6: Modified independent (Device/Increase time) Dynamic Standing Balance Dynamic Standing - Balance Support: During functional activity;No upper extremity supported Dynamic Standing -  Level of Assistance: 6: Modified independent (Device/Increase time) Extremity/Trunk Assessment RUE Assessment RUE Assessment: Exceptions to [facility] General Strength Comments: AROM WFLs strength not formally assessed secondary to sternal precautions.  Grip strength 40lbs LUE Assessment LUE Assessment: Exceptions to [facility] General Strength Comments: Pt with full gross digit flexion but limited extension.  Digits 2 and 5 demonstrate MP extension of -20 degrees approximately while digits 3 and 4 exhibit -40 degrees.  PIP extension is limted to -20 degrees for digits 3 and 4 and -10 degrees for digits 2 and 5. LUE Body System: Neuro LUE PROM (degrees) Left Composite Finger Extension: 50% LUE Strength Left Wrist Flexion: 4-/5 Left Wrist Extension: 3+/5 Left Hand Grip (lbs): 19 lbs   Conner Muegge OTR/L 05/08/2019, 5:00 PM

## 2019-05-08 NOTE — Progress Notes (Signed)
[facility] ROUNDING NOTE   Subjective:   End-stage renal disease Monday Wednesday Friday dialysis history of chronic disease status post CABG 04/16/2019 status post cardiogenic shock STEMI status post Impella device that was removed 04/19/2019.  New start to dialysis on admission AV fistula with difficult cannulation TDC placed by vein and vascular surgery 04/29/2019.  He has had a prolonged hospitalization with slow recovery.  He is currently receiving rehab inpatient.  His 2D echo 04/17/2019 showed next ejection fraction of 30%.  He has had persistent hypotension and has required high doses of midodrine to be administered.  His postop course he had atrial fibrillation as well as a febrile illness.  He also had thrombocytopenia probably related to surgery.  His HIT test was negative.  Blood pressure 106/43 pulse 75 temperature 98.1 O2 sats 94% room air  Sodium 135 potassium 3.5 chloride 96 CO2 25 BUN 35 creatinine 5.62 glucose 101 calcium 9.2 phosphorus 3.8 albumin 2.2 WBC 7.9 hemoglobin 7.8 platelets 235  Allopurinol 100 mg daily, amiodarone 200 mg twice daily, Eliquis 5 mg twice daily, aspirin 81 mg daily, bisoprolol 2.5 mg 4 times a week, cinacalcet 30 mg daily, darbepoetin 60 mcg q. Monday, midodrine 10 mg with dialysis, hydrocortisone 25 mg twice daily Protonix 40 mg daily, Lyrica 50 mg twice daily, Crestor 10 mg daily Renvela 800 mg 3 times daily with meals, IV iron Monday Wednesday Friday  Objective:  Vital signs in last 24 hours:  Temp:  [98.1 F (36.7 C)-98.4 F (36.9 C)] 98.1 F (36.7 C) (10/16 0607) Pulse Rate:  [71-75] 75 (10/16 0607) Resp:  [18-19] 19 (10/16 0607) BP: (99-122)/(43-68) 106/43 (10/16 0607) SpO2:  [94 %-100 %] 94 % (10/16 0607) Weight:  [130.9 kg] 130.9 kg (10/16 0607)  Weight change: 3.1 kg Filed Weights   05/06/19 1225 05/07/19 0507 05/08/19 0607  Weight: 127.8 kg 129 kg 130.9 kg    Intake/Output: I/O last 3 completed shifts: In: 76  [P.O.:480] Out: 2 [Urine:2]   Intake/Output this shift:  Total I/O In: 240 [P.O.:240] Out: -   General:Pleasant older male in NAD [id number] 2/6 systolic M. No JVD. Lungs:CTAB A/P Abdomen:S, NT Extremities:1+ BLE edema Dialysis Access:RIJ TDC Drsg CDI, Maturing R AVF + bruit. Bruising present.   Basic Metabolic Panel: Recent Labs  Lab 05/04/19 0425 05/04/19 1114 05/06/19 1300  NA 136 134* 135  K 3.9 4.3 3.5  CL 98 96* 96*  CO2 27 25 25   GLUCOSE 96 112* 101*  BUN 51* 56* 35*  CREATININE 8.18* 8.57* 5.62*  CALCIUM 9.8 9.7 9.2  PHOS  --  4.6 3.8    Liver Function Tests: Recent Labs  Lab 05/04/19 0425 05/04/19 1114 05/06/19 1300  AST 23  --   --   ALT 19  --   --   ALKPHOS 94  --   --   BILITOT 1.1  --   --   PROT 4.7*  --   --   ALBUMIN 2.2* 2.4* 2.2*   No results for input(s): LIPASE, AMYLASE in the last 168 hours. No results for input(s): AMMONIA in the last 168 hours.  CBC: Recent Labs  Lab 05/03/19 1051 05/04/19 0425 05/04/19 1114 05/06/19 1300 05/07/19 1534  WBC  --  6.9 7.3 5.1 7.9  NEUTROABS  --  4.3  --   --   --   HGB 7.0* 6.4* 6.9* 7.1* 7.8*  HCT 22.6* 20.4* 21.9* 23.7* 25.3*  MCV  --  92.7 92.0 92.6 92.7  PLT  --  210 224 214 235    Cardiac Enzymes: No results for input(s): CKTOTAL, CKMB, CKMBINDEX, TROPONINI in the last 168 hours.  BNP: Invalid input(s): POCBNP  CBG: No results for input(s): GLUCAP in the last 168 hours.  Microbiology: Results for orders placed or performed during the hospital encounter of 04/16/19  SARS Coronavirus 2 [facility] order, Performed in [facility] hospital lab) Nasopharyngeal Nasopharyngeal Swab     Status: None   Collection Time: 04/16/19  7:50 AM   Specimen: Nasopharyngeal Swab  Result Value Ref Range Status   SARS Coronavirus 2 NEGATIVE NEGATIVE Final    Comment: (NOTE) If result is NEGATIVE SARS-CoV-2 target nucleic acids are NOT DETECTED. The SARS-CoV-2 RNA is generally detectable  in upper and lower  respiratory specimens during the acute phase of infection. The lowest  concentration of SARS-CoV-2 viral copies this assay can detect is 250  copies / mL. A negative result does not preclude SARS-CoV-2 infection  and should not be used as the sole basis for treatment or other  patient management decisions.  A negative result may occur with  improper specimen collection / handling, submission of specimen other  than nasopharyngeal swab, presence of viral mutation(s) within the  areas targeted by this assay, and inadequate number of viral copies  (<250 copies / mL). A negative result must be combined with clinical  observations, patient history, and epidemiological information. If result is POSITIVE SARS-CoV-2 target nucleic acids are DETECTED. The SARS-CoV-2 RNA is generally detectable in upper and lower  respiratory specimens dur ing the acute phase of infection.  Positive  results are indicative of active infection with SARS-CoV-2.  Clinical  correlation with patient history and other diagnostic information is  necessary to determine patient infection status.  Positive results do  not rule out bacterial infection or co-infection with other viruses. If result is PRESUMPTIVE POSTIVE SARS-CoV-2 nucleic acids MAY BE PRESENT.   A presumptive positive result was obtained on the submitted specimen  and confirmed on repeat testing.  While 2019 novel coronavirus  (SARS-CoV-2) nucleic acids may be present in the submitted sample  additional confirmatory testing may be necessary for epidemiological  and / or clinical management purposes  to differentiate between  SARS-CoV-2 and other Sarbecovirus currently known to infect humans.  If clinically indicated additional testing with an alternate test  methodology [phone]) is advised. The SARS-CoV-2 RNA is generally  detectable in upper and lower respiratory sp ecimens during the acute  phase of infection. The expected result is  Negative. Fact Sheet for Patients:  StrictlyIdeas.no Fact Sheet for Healthcare Providers: BankingDealers.co.za This test is not yet approved or cleared by the [place] FDA and has been authorized for detection and/or diagnosis of SARS-CoV-2 by FDA under an Emergency Use Authorization (EUA).  This EUA will remain in effect (meaning this test can be used) for the duration of the COVID-19 declaration under Section 564(b)(1) of the Act, 21 U.S.C. section 360bbb-3(b)(1), unless the authorization is terminated or revoked sooner. Performed at [facility], [place] [address] [place], [place] [address]   MRSA PCR Screening     Status: None   Collection Time: 04/17/19  9:16 PM   Specimen: Nasal Mucosa; Nasopharyngeal  Result Value Ref Range Status   MRSA by PCR NEGATIVE NEGATIVE Final    Comment:        The GeneXpert MRSA Assay (FDA approved for NASAL specimens only), is one component of a comprehensive MRSA colonization surveillance program.  It is not intended to diagnose MRSA infection nor to guide or monitor treatment for MRSA infections. Performed at [facility], [place] [address], [place], [place] [address]     Coagulation Studies: No results for input(s): LABPROT, INR in the last 72 hours.  Urinalysis: No results for input(s): COLORURINE, LABSPEC, PHURINE, GLUCOSEU, HGBUR, BILIRUBINUR, KETONESUR, PROTEINUR, UROBILINOGEN, NITRITE, LEUKOCYTESUR in the last 72 hours.  Invalid input(s): APPERANCEUR    Imaging: No results found.   Medications:   . sodium chloride    . sodium chloride    . ferric gluconate (FERRLECIT/NULECIT) IV 125 mg (05/04/19 1625)   . sodium chloride   Intravenous Once  . allopurinol  100 mg Oral Daily  . amiodarone  200 mg Oral BID  . apixaban  5 mg Oral BID  . aspirin EC  81 mg Oral Daily  . bisacodyl  10 mg Oral Daily   Or  . bisacodyl  10 mg Rectal Daily  . bisoprolol  2.5 mg Oral  Once per day on Sun Tue Thu Sat  . Chlorhexidine Gluconate Cloth  6 each Topical Q0600  . cinacalcet  30 mg Oral Q supper  . darbepoetin (ARANESP) injection - DIALYSIS  60 mcg Intravenous Q Mon-HD  . diclofenac sodium  2 g Topical QID  . docusate sodium  100 mg Oral BID  . feeding supplement (PRO-STAT SUGAR FREE 64)  30 mL Oral BID  . fluticasone  2 spray Each Nare Daily  . heparin  1,000 Units Intravenous Q M,W,F-HD  . hydrocortisone  25 mg Rectal BID  . midodrine  10 mg Oral Q M,W,F-HD  . multivitamin  1 tablet Oral QHS  . pantoprazole  40 mg Oral Daily  . pregabalin  50 mg Oral BID  . psyllium  1 packet Oral BID  . rosuvastatin  10 mg Oral q1800  . sevelamer carbonate  800 mg Oral TID WC  . sodium chloride flush  10-40 mL Intracatheter Q12H   sodium chloride, sodium chloride, alteplase, bisacodyl, diphenhydrAMINE, heparin, hydrocortisone, lidocaine (PF), lidocaine-prilocaine, midodrine, oxyCODONE, pentafluoroprop-tetrafluoroeth, phenol, sodium chloride flush, sorbitol, traMADol, witch hazel-glycerin  Assessment/ Plan:   ESRD-Monday Wednesday Friday dialysis.  New start to dialysis following emergent CABG 04/16/2019 required Impella device that was removed 04/19/2019.  Dialysis planned for 05/08/2019.  Dialysis on 05/06/2019 was successful with removal of 1.6 L  Hypertension/volume requires midodrine for support will increase to 10 mg 3 times daily due to hypotension  Bone/mineral continues on binders and cinacalcet  Anemia continues on darbepoetin 60 mcg weekly and IV iron we will continue to follow  Hyperlipidemia continues Crestor 10 mg daily  Coronary artery disease status post CABG x3 04/16/2019 aspirin lipid-lowering agents  Atrial fibrillation on p.o. amiodarone and Eliquis  Empiric antibiotics for presumed infection stopped 04/24/2019  Possible urine retention follow bladder scans.   LOS: [place] @TODAY @9 :56 AM

## 2019-05-09 DIAGNOSIS — E876 Hypokalemia: Secondary | ICD-10-CM | POA: Insufficient documentation

## 2019-05-09 DIAGNOSIS — Z992 Dependence on renal dialysis: Secondary | ICD-10-CM

## 2019-05-09 DIAGNOSIS — D649 Anemia, unspecified: Secondary | ICD-10-CM

## 2019-05-09 DIAGNOSIS — Z951 Presence of aortocoronary bypass graft: Secondary | ICD-10-CM

## 2019-05-09 DIAGNOSIS — N186 End stage renal disease: Secondary | ICD-10-CM

## 2019-05-09 DIAGNOSIS — E440 Moderate protein-calorie malnutrition: Secondary | ICD-10-CM | POA: Insufficient documentation

## 2019-05-09 NOTE — Progress Notes (Signed)
[facility] PHYSICAL MEDICINE & REHABILITATION PROGRESS NOTE   Subjective/Complaints: Patient seen sitting up at the edge of his bed this morning.  He states he slept well overnight.  He states he is ready for discharge.  He has questions regarding cardiology follow-up.  ROS: Denies CP, SOB, N/V/D  Objective:   No results found. Recent Labs    05/06/19 1300 05/07/19 1534  WBC 5.1 7.9  HGB 7.1* 7.8*  HCT 23.7* 25.3*  PLT 214 235   Recent Labs    05/06/19 1300  NA 135  K 3.5  CL 96*  CO2 25  GLUCOSE 101*  BUN 35*  CREATININE 5.62*  CALCIUM 9.2    Intake/Output Summary (Last 24 hours) at 05/09/2019 1106 Last data filed at 05/09/2019 1027 Gross per 24 hour  Intake 940 ml  Output 1855 ml  Net -915 ml     Physical Exam: Vital Signs Blood pressure (!) 108/51, pulse 75, temperature 98.8 F (37.1 C), resp. rate 18, weight 128.2 kg, SpO2 97 %. Constitutional: No distress . Vital signs reviewed.  Morbidly obese. HENT: Normocephalic.  Atraumatic. Eyes: EOMI. No discharge. Cardiovascular: No JVD. Respiratory: Normal effort.  No stridor. GI: Non-distended. Skin: Warm and dry.  Intact. Psych: Normal mood.  Normal behavior. Musc: Bilateral lower extremity edema  Neurological: He is alert Follows full commands.   Motor: Right upper extremity: 5/5 proximal distal, unchanged Left upper extremity: prox 5/5; hand 4/5 Bilateral lower extremities: 4/5 proximal distal  Assessment/Plan: 1. Functional deficits secondary to debility/ NSTEMI and CABG x 3 vessels and new ESRD/with HD which require 3+ hours per day of interdisciplinary therapy in a comprehensive inpatient rehab setting.  Physiatrist is providing close team supervision and 24 hour management of active medical problems listed below.  Physiatrist and rehab team continue to assess barriers to discharge/monitor patient progress toward functional and medical goals  Care Tool:  Bathing    Body parts bathed by  patient: Right arm, Left arm, Chest, Abdomen, Front perineal area, Right upper leg, Face, Left upper leg, Right lower leg, Left lower leg   Body parts bathed by helper: Buttocks     Bathing assist Assist Level: Minimal Assistance - Patient > 75%     Upper Body Dressing/Undressing Upper body dressing   What is the patient wearing?: Pull over shirt    Upper body assist Assist Level: Independent    Lower Body Dressing/Undressing Lower body dressing      What is the patient wearing?: Pants, Underwear/pull up     Lower body assist Assist for lower body dressing: Independent with assitive device     Toileting Toileting    Toileting assist Assist for toileting: Minimal Assistance - Patient > 75% Assistive Device Comment: (walker)   Transfers Chair/bed transfer  Transfers assist     Chair/bed transfer assist level: Independent with assistive device Chair/bed transfer assistive device: Programmer, multimedia   Ambulation assist      Assist level: Independent with assistive device Assistive device: Walker-rolling Max distance: 110'   Walk 10 feet activity   Assist     Assist level: Independent with assistive device Assistive device: Walker-rolling   Walk 50 feet activity   Assist    Assist level: Independent with assistive device Assistive device: Walker-rolling    Walk 150 feet activity   Assist Walk 150 feet activity did not occur: Safety/medical concerns(decreased strength/activity tolerance)  Assist level: Contact Guard/Touching assist Assistive device: Walker-rolling    Walk 10 feet  on uneven surface  activity   Assist Walk 10 feet on uneven surfaces activity did not occur: Safety/medical concerns(decreased strength/activity tolerance)   Assist level: Contact Guard/Touching assist     Wheelchair     Assist Will patient use wheelchair at discharge?: No   Wheelchair activity did not occur: N/A  Wheelchair assist level:  Maximal Assistance - Patient 25 - 49%(attempted w/LEs only due to sternal precs.) Max wheelchair distance: 59ft    Wheelchair 50 feet with 2 turns activity    Assist    Wheelchair 50 feet with 2 turns activity did not occur: N/A       Wheelchair 150 feet activity     Assist  Wheelchair 150 feet activity did not occur: N/A       Blood pressure (!) 108/51, pulse 75, temperature 98.8 F (37.1 C), resp. rate 18, weight 128.2 kg, SpO2 97 %.  Medical Problem List and Plan: 1.  Debility secondary to non-STEMI status post CABG x3 04/16/2019.  Sternal precautions      DC today  Will see patient for transitional care management in 1-2 weeks post-discharge 2.  Antithrombotics: -DVT/anticoagulation: Eliquis             -antiplatelet therapy: Aspirin 81 mg daily 3. Pain Management: Oxycodone/Ultram as needed  Continue Lyrica discharge  4. Mood: Provide emotional support             -antipsychotic agents: N/A 5. Neuropsych: This patient is capable of making decisions on his own behalf. 6. Skin/Wound Care: Routine skin checks 7. Fluids/Electrolytes/Nutrition: Routine in and outs.   8.  Acute on chronic anemia.  CBC ordered for tomorrow a.m.  Continue Aranesp  Hemoglobin 7.8 on 10/15, monitor as outpatient  9.  Atrial fibrillation with RVR.  Amiodarone 200 mg twice daily.  Cardiac rate controlled             Monitor with increased physical exertion. 10.  Hypotension.  ProAmatine 5 mg Monday Wednesday Friday            Vitals:   05/08/19 2024 05/09/19 0533  BP: (!) 130/54 (!) 108/51  Pulse: 75 75  Resp: 20 18  Temp: 98.4 F (36.9 C) 98.8 F (37.1 C)  SpO2: 94% 97%   Labile on 10/17, will need ambulatory follow-up  Added midodrine 2.5 mg 2x/day prn for SBP <90 AND feeling lightheaded.  11.    ESRD on hemodialysis.  Status post tunneled catheter 04/29/2019- no shower/bed baths with catheter.  12.  Hyperlipidemia.  Crestor 13.  Constipation with hemorrhoids.  Laxative  assistance.  Continue Tucks pads  10/12- added Colace 1 tab BID- might need more laxatives- will see. 14. LUE nerve compression- will need EMG after Rehab D/C.  10/16- slightly improved- can't extend- might need cervical MRI- will see in f/u. 15. LUE Edema- will see if OT can get compression glove for edema control or kineseotaping.  10/15- doing kineseotaping of L hand  16. Leukocytosis-resolved  LOS: 9 days A FACE TO FACE EVALUATION WAS PERFORMED  Ankit Lorie Phenix 05/09/2019, 11:06 AM

## 2019-05-09 NOTE — Progress Notes (Signed)
Alex Williams to be D/C'd per MD order. Discussed with the patient and all questions fully answered. ? VSS, Skin clean, dry and intact without evidence of skin break down, no evidence of skin tears noted. ? PICC discontinued intact. Site without signs and symptoms of complications. Dressing and pressure applied. ? An After Visit Summary was printed and given to the patient. Patient informed where to pickup prescriptions. ? D/c education completed with patient/family including follow up instructions, medication list, d/c activities limitations if indicated, with other d/c instructions as indicated by MD - patient able to verbalize understanding, all questions fully answered.  ? Patient instructed to return to ED, call 911, or call MD for any changes in condition.  ? Patient to be escorted via [place], and D/C home via private auto with daughter.

## 2019-05-12 ENCOUNTER — Telehealth: Payer: Self-pay

## 2019-05-12 NOTE — Telephone Encounter (Signed)
Transitional Care call-patient    1. Are you/is patient experiencing any problems since coming home? No Are there any questions regarding any aspect of care? No 2. Are there any questions regarding medications administration/dosing? No Are meds being taken as prescribed? Yes Patient should review meds with caller to confirm 3. Have there been any falls? 1 fall 4. Has Home Health been to the house and/or have they contacted you? No If not, have you tried to contact them? no Can we help you contact them? Calling [facility] have the referral and has contacted him once, nothing scheduled at this time 5. Are bowels and bladder emptying properly? Yes Are there any unexpected incontinence issues? No If applicable, is patient following bowel/bladder programs? 6. Any fevers, problems with breathing, unexpected pain? No 7. Are there any skin problems or new areas of breakdown? No 8. Has the patient/family member arranged specialty MD follow up (ie cardiology/neurology/renal/surgical/etc)? Yes  Can we help arrange? 9. Does the patient need any other services or support that we can help arrange? No 10. Are caregivers following through as expected in assisting the patient? Yes 11. Has the patient quit smoking, drinking alcohol, or using drugs as recommended? Yes  Appointment time 2:40 pm  arrive time 2:20 pm and with Danella Sensing, NP on 05/21/2019 [place]

## 2019-05-14 ENCOUNTER — Telehealth: Payer: Self-pay

## 2019-05-14 NOTE — Telephone Encounter (Signed)
[facility] called to let the doctor know that somehow the pt.'s referral had gotten overlooked and had not been scheduled and wanted to know if it was ok to schedule the pt for 10/24.

## 2019-05-15 ENCOUNTER — Ambulatory Visit: Payer: Medicare Other | Admitting: Thoracic Surgery (Cardiothoracic Vascular Surgery)

## 2019-05-15 NOTE — Telephone Encounter (Signed)
Yes, though not sure if that means with me or with Home health. Either way its ok. thanks

## 2019-05-15 NOTE — Telephone Encounter (Signed)
Please advise 

## 2019-05-19 ENCOUNTER — Other Ambulatory Visit: Payer: Self-pay

## 2019-05-19 ENCOUNTER — Ambulatory Visit (HOSPITAL_COMMUNITY)
Admission: RE | Admit: 2019-05-19 | Discharge: 2019-05-19 | Disposition: A | Discharge status: Home / Self Care | Payer: Medicare Other | Source: Ambulatory Visit | Attending: Cardiology | Admitting: Cardiology

## 2019-05-19 ENCOUNTER — Other Ambulatory Visit (HOSPITAL_COMMUNITY): Payer: Medicare Other

## 2019-05-19 ENCOUNTER — Encounter (HOSPITAL_COMMUNITY): Payer: Self-pay

## 2019-05-19 VITALS — BP 106/62 | HR 81 | Wt 286.0 lb

## 2019-05-19 DIAGNOSIS — Z7901 Long term (current) use of anticoagulants: Secondary | ICD-10-CM | POA: Diagnosis not present

## 2019-05-19 DIAGNOSIS — E7800 Pure hypercholesterolemia, unspecified: Secondary | ICD-10-CM

## 2019-05-19 DIAGNOSIS — I4891 Unspecified atrial fibrillation: Secondary | ICD-10-CM | POA: Insufficient documentation

## 2019-05-19 DIAGNOSIS — Z87891 Personal history of nicotine dependence: Secondary | ICD-10-CM | POA: Diagnosis not present

## 2019-05-19 DIAGNOSIS — M109 Gout, unspecified: Secondary | ICD-10-CM | POA: Insufficient documentation

## 2019-05-19 DIAGNOSIS — Z791 Long term (current) use of non-steroidal anti-inflammatories (NSAID): Secondary | ICD-10-CM | POA: Diagnosis not present

## 2019-05-19 DIAGNOSIS — Z992 Dependence on renal dialysis: Secondary | ICD-10-CM | POA: Diagnosis not present

## 2019-05-19 DIAGNOSIS — Z885 Allergy status to narcotic agent status: Secondary | ICD-10-CM | POA: Diagnosis not present

## 2019-05-19 DIAGNOSIS — K219 Gastro-esophageal reflux disease without esophagitis: Secondary | ICD-10-CM | POA: Diagnosis not present

## 2019-05-19 DIAGNOSIS — Z79899 Other long term (current) drug therapy: Secondary | ICD-10-CM | POA: Diagnosis not present

## 2019-05-19 DIAGNOSIS — E785 Hyperlipidemia, unspecified: Secondary | ICD-10-CM | POA: Insufficient documentation

## 2019-05-19 DIAGNOSIS — G4733 Obstructive sleep apnea (adult) (pediatric): Secondary | ICD-10-CM | POA: Insufficient documentation

## 2019-05-19 DIAGNOSIS — I255 Ischemic cardiomyopathy: Secondary | ICD-10-CM | POA: Insufficient documentation

## 2019-05-19 DIAGNOSIS — I2510 Atherosclerotic heart disease of native coronary artery without angina pectoris: Secondary | ICD-10-CM | POA: Diagnosis not present

## 2019-05-19 DIAGNOSIS — Z8719 Personal history of other diseases of the digestive system: Secondary | ICD-10-CM | POA: Insufficient documentation

## 2019-05-19 DIAGNOSIS — I5022 Chronic systolic (congestive) heart failure: Secondary | ICD-10-CM | POA: Insufficient documentation

## 2019-05-19 DIAGNOSIS — Z8249 Family history of ischemic heart disease and other diseases of the circulatory system: Secondary | ICD-10-CM | POA: Insufficient documentation

## 2019-05-19 DIAGNOSIS — I132 Hypertensive heart and chronic kidney disease with heart failure and with stage 5 chronic kidney disease, or end stage renal disease: Secondary | ICD-10-CM | POA: Insufficient documentation

## 2019-05-19 DIAGNOSIS — Z7982 Long term (current) use of aspirin: Secondary | ICD-10-CM | POA: Insufficient documentation

## 2019-05-19 DIAGNOSIS — Z951 Presence of aortocoronary bypass graft: Secondary | ICD-10-CM | POA: Insufficient documentation

## 2019-05-19 DIAGNOSIS — I484 Atypical atrial flutter: Secondary | ICD-10-CM | POA: Diagnosis not present

## 2019-05-19 DIAGNOSIS — N186 End stage renal disease: Secondary | ICD-10-CM | POA: Diagnosis not present

## 2019-05-19 DIAGNOSIS — I252 Old myocardial infarction: Secondary | ICD-10-CM | POA: Insufficient documentation

## 2019-05-19 LAB — CBC
HCT: 27.1 % — ABNORMAL LOW (ref 39.0–52.0)
Hemoglobin: 7.7 g/dL — ABNORMAL LOW (ref 13.0–17.0)
MCH: 27.6 pg (ref 26.0–34.0)
MCHC: 28.4 g/dL — ABNORMAL LOW (ref 30.0–36.0)
MCV: 97.1 fL (ref 80.0–100.0)
Platelets: 228 10*3/uL (ref 150–400)
RBC: 2.79 MIL/uL — ABNORMAL LOW (ref 4.22–5.81)
RDW: 18.1 % — ABNORMAL HIGH (ref 11.5–15.5)
WBC: 9.1 10*3/uL (ref 4.0–10.5)
nRBC: 0 % (ref 0.0–0.2)

## 2019-05-19 MED ORDER — AMIODARONE HCL 200 MG PO TABS: 200.0000 mg | ORAL_TABLET | Freq: Every day | ORAL | 1 refills | Status: DC

## 2019-05-19 NOTE — Progress Notes (Signed)
Advanced Heart Failure Clinic Note   Referring Physician: PCP: [name], MD PCP-Cardiologist: Dr. Angelena Form Dr. Aundra Dubin  Reason for Visit: Pt presents to clinic today for post hospital f/u following admission for Acute MI c/b Atrial Flutter w/ RVR and Cardiogenic Shock  HPI:  68 y.o. with history of ESRD (newly started on HD), gout, HTN, hyperlipidemia recently admitted to [facility] 04/16/19 for chest pain and palpitations.  On arrival to the ED, he was noted to be in atypical atrial flutter in the 130s-140s with acute anterior infarction. CODE STEMI activated.   He was brought to the cath lab and noted to be hypotensive with SBP in 80s. Norepinephrine was started and Impella was placed.  LHC and RHC were done:   Mean RA 15, PA 68/32 mean 45, CI 3.3.  There was 90% distal left main stenosis into the LCx, totally occluded mid LAD after diagonal, 90% mild LCx, 50% ostial RCA, 99% ostial PDA.  POBA to mid LAD but unable to restore flow.   It was suspected that the LAD was a chronic occlusion and the onset of atypical aflutter with RVR drove chest pain and ischemia (via severe distal left main stenosis).  Hs-TnI returned at 1297. Hgb 8, this is chronically low.    Impella was adjusted under echo, was functioning properly with flow 3.8 L/min at P8. Patient was started on amiodarone gtt.  He was in and out of atypical flutter while in the cath lab. Echo showed EF 25% range with apical and peri-apical severe hypokinesis.   Patient was taken to the OR for urgent CABGx 3 by Dr. Kipp Brood. Unable to graft LAD>> (LIMA-D2, SVG-OM2, SVG-PDA). He required dobutamine and NE post op and was eventually weaned off and Impella removed. Afib/flutter treated w/ amiodarone and converted to NSR. Was placed on Eliquis for a/c. Required CVVH for volume removal. Later transitioned to HD. Hypotension limited aggressive HF therapy. Was placed on midodrine to support BP during HD. Once stable, he was transferred to  SNF for rehab.   He has since returned home but getting home PT. He presents to clinic for post hospital f/u. He is here w/ his daughter. Reports that he is doing well. Denies CP and palpitations. Doing HD MWF. Now requires midodrine daily for BP support, 10 mg on HD days and 5 mg on non HD days. Has f/u w/ CT surgery later this week. Compliant w/ meds.    2D Echo  2D echo 03/2019   Left ventricular ejection fraction, by visual estimation, is 30%. The left ventricle has severely decreased function. Severely increased left ventricular size. There is no left ventricular hypertrophy. 2. LVEF is depreesed at approximately 30% with severe hypokinesis/akinesis of the distal 1/2 to 2/3 of LV Impella device present. Poor acoustic windows limit study. 3. Global right ventricle was not well visualized.The right ventricular size is not well visualized. Right vetricular wall thickness was not assessed. 4. Left atrial size was mildly dilated. 5. Right atrial size was mildly dilated. 6. Possible small posterior pericardial effusion. 7. Mild mitral annular calcification. 8. The mitral valve is abnormal. Mild to moderate mitral valve regurgitation. 9. The tricuspid valve is not well visualized. Tricuspid valve regurgitation is mild. 10. The aortic valve was not well visualized Aortic valve regurgitation was not visualized by color flow Doppler. Mild aortic valve sclerosis without stenosis. 11. The aortic root was not well visualized. 12. The pericardium was not well visualized.   Review of Systems: [y] = yes, [ ]  =  no   General: Weight gain [ ] ; Weight loss [ ] ; Anorexia [ ] ; Fatigue [ ] ; Fever [ ] ; Chills [ ] ; Weakness [ ]   Cardiac: Chest pain/pressure [ ] ; Resting SOB [ ] ; Exertional SOB [ ] ; Orthopnea [ ] ; Pedal Edema [ ] ; Palpitations [ ] ; Syncope [ ] ; Presyncope [ ] ; Paroxysmal nocturnal dyspnea[ ]   Pulmonary: Cough [ ] ; Wheezing[ ] ; Hemoptysis[ ] ; Sputum [ ] ; Snoring [ ]   GI: Vomiting[ ] ;  Dysphagia[ ] ; Melena[ ] ; Hematochezia [ ] ; Heartburn[ ] ; Abdominal pain [ ] ; Constipation [ ] ; Diarrhea [ ] ; BRBPR [ ]   GU: Hematuria[ ] ; Dysuria [ ] ; Nocturia[ ]   Vascular: Pain in legs with walking [ ] ; Pain in feet with lying flat [ ] ; Non-healing sores [ ] ; Stroke [ ] ; TIA [ ] ; Slurred speech [ ] ;  Neuro: Headaches[ ] ; Vertigo[ ] ; Seizures[ ] ; Paresthesias[ ] ;Blurred vision [ ] ; Diplopia [ ] ; Vision changes [ ]   Ortho/Skin: Arthritis [ ] ; Joint pain [ ] ; Muscle pain [ ] ; Joint swelling [ ] ; Back Pain [ ] ; Rash [ ]   Psych: Depression[ ] ; Anxiety[ ]   Heme: Bleeding problems [ ] ; Clotting disorders [ ] ; Anemia [ ]   Endocrine: Diabetes [ ] ; Thyroid dysfunction[ ]    Past Medical History:  Diagnosis Date  . Cervical disc disease   . Chronic kidney disease   . COLONIC POLYPS, HX OF 06/27/2007  . EXOGENOUS OBESITY 01/30/2010  . GERD (gastroesophageal reflux disease)    PMH  . GOUT 01/30/2010  . HYPERCHOLESTEROLEMIA 06/30/2007  . HYPERTENSION 06/27/2007  . LOW BACK PAIN 06/27/2007  . NEPHROLITHIASIS, HX OF 06/27/2007  . OSTEOARTHRITIS 06/27/2007  . SLEEP APNEA, OBSTRUCTIVE, MODERATE 01/27/2009  . Wears dentures    upper    Current Outpatient Medications  Medication Sig Dispense Refill  . allopurinol (ZYLOPRIM) 100 MG tablet Take 1 tablet (100 mg total) by mouth daily. 30 tablet 0  . amiodarone (PACERONE) 200 MG tablet Take 1 tablet (200 mg total) by mouth daily. 30 tablet 1  . apixaban (ELIQUIS) 5 MG TABS tablet Take 1 tablet (5 mg total) by mouth 2 (two) times daily. 60 tablet 1  . aspirin EC 81 MG EC tablet Take 1 tablet (81 mg total) by mouth daily.    . B Complex-C (B-COMPLEX WITH VITAMIN C) tablet Take 1 tablet by mouth daily.    . bisoprolol (ZEBETA) 5 MG tablet Take 5 mg by mouth as directed. On NON Dialysis days (T,Th,Sat,Sun)    . cinacalcet (SENSIPAR) 30 MG tablet Take 1 tablet (30 mg total) by mouth daily with supper. 60 tablet 0  . diclofenac sodium (VOLTAREN) 1 % GEL Apply 2 g  topically 4 (four) times daily. 2 g 0  . fluticasone (FLONASE) 50 MCG/ACT nasal spray Place 2 sprays into both nostrils daily. (Patient taking differently: Place 2 sprays into both nostrils daily as needed for allergies or rhinitis. ) 16 g 6  . midodrine (PROAMATINE) 10 MG tablet Take 1 tablet (10 mg total) by mouth every Monday, Wednesday, and Friday with hemodialysis. 30 tablet 0  . multivitamin (RENA-VIT) TABS tablet Take 1 tablet by mouth at bedtime. 30 tablet 0  . pantoprazole (PROTONIX) 40 MG tablet Take 1 tablet (40 mg total) by mouth daily. 30 tablet 0  . pregabalin (LYRICA) 50 MG capsule Take 1 capsule (50 mg total) by mouth 2 (two) times daily. 60 capsule 1  . psyllium (HYDROCIL/METAMUCIL) 95 % PACK Take 1 packet by mouth 2 (two)  times daily. 240 each 1  . rosuvastatin (CRESTOR) 10 MG tablet Take 1 tablet (10 mg total) by mouth daily at 6 PM. 60 tablet 1  . sevelamer carbonate (RENVELA) 800 MG tablet Take 1 tablet (800 mg total) by mouth 3 (three) times daily with meals. 90 tablet 0  . traMADol (ULTRAM) 50 MG tablet Take 1 tablet (50 mg total) by mouth every 12 (twelve) hours as needed for moderate pain. 30 tablet 0   No current facility-administered medications for this encounter.     Allergies  Allergen Reactions  . Codeine Phosphate Other (See Comments)    Hyperactive       Social History   Socioeconomic History  . Marital status: Legally Separated    Spouse name: Not on file  . Number of children: 2  . Years of education: Not on file  . Highest education level: Not on file  Occupational History  . Not on file  Social Needs  . Financial resource strain: Not hard at all  . Food insecurity    Worry: Never true    Inability: Never true  . Transportation needs    Medical: No    Non-medical: No  Tobacco Use  . Smoking status: Former Smoker    Quit date: 07/23/1980    Years since quitting: 38.8  . Smokeless tobacco: Never Used  Substance and Sexual Activity  .  Alcohol use: Yes    Alcohol/week: 1.0 standard drinks    Types: 1 Cans of beer per week    Comment: rare  . Drug use: No  . Sexual activity: Not Currently  Lifestyle  . Physical activity    Days per week: 2 days    Minutes per session: 20 min  . Stress: Not at all  Relationships  . Social Herbalist on phone: Three times a week    Gets together: More than three times a week    Attends religious service: More than 4 times per year    Active member of club or organization: No    Attends meetings of clubs or organizations: Never    Relationship status: Patient refused  . Intimate partner violence    Fear of current or ex partner: No    Emotionally abused: No    Physically abused: No    Forced sexual activity: No  Other Topics Concern  . Not on file  Social History Narrative   Pt lives alone. He has one son who lives within 3 miles who he usually sees daily. He has two sisters living in the next town over who he also sees on a weekly basis. He is a religious man who attends church at least 3 Sundays each month.   He operates [employer] full time.       Family History  Problem Relation Age of Onset  . Hypertension Other   . Diabetes Sister   . Hyperlipidemia Sister   . Sleep apnea Sister     Vitals:   05/19/19 1325  BP: 106/62  Pulse: 81  SpO2: 100%  Weight: 129.7 kg (286 lb)     PHYSICAL EXAM: General:  Well appearing, obese WM. No respiratory difficulty HEENT: normal Neck: supple. no JVD. Carotids 2+ bilat; no bruits. No lymphadenopathy or thyromegaly appreciated. Cor: PMI nondisplaced. Regular rate & rhythm. No rubs, gallops or murmurs. Lungs: clear Abdomen: soft, nontender, nondistended. No hepatosplenomegaly. No bruits or masses. Good bowel sounds. Extremities: no cyanosis, clubbing, rash, edema Neuro:  alert & oriented x 3, cranial nerves grossly intact. moves all 4 extremities w/o difficulty. Affect pleasant.  ECG: NSR w/ 1st degree AVB 69 bmp     ASSESSMENT & PLAN:  1. CAD: Acute MI 03/2019. Patient taken urgently for CABG supported by Impella. S/P CABG x3 on 9/24 by Dr. Kipp Brood (unable to graft LAD>> LIMA-D2, SVG-OM2, SVG-PDA).  - Denies angina. Sternotomy wound well healed. - Continue ASA 81.  - Continue Crestor 10 mg daily (maximum for ESRD). LDL elevated at 142. Goal given multivessel CAD <70. Plan to repeat FLP and HFTs in 6 weeks - Continue low dose bisoprolol 2.5 daily on non-HD days. Tolerating ok.     2. Chronic systolic CHF: Echo with EF in 30% range with peri-apical severe hypokinesis. Ischemic cardiomyopathy. Now s/p CABG.  -Hopefully EF will improve post revascularization -Guidelines directed medical therapy limited by CKD and hypotension requiring midodrine.   -BP too soft for bidil  - Continue low dose bisoprolol on non-HD days  - Volume management by HD.     4. Paroxysmal Atrial flutter/fib: NSR on EKG. HR in the 60s. Denies breakthrough symptoms. - Decrease amiodarone to 200 mg bid. Will need TFTs and HFTs q 6 months - On eliquis 5 mg twice a day. Denies abnormal bleeding. Check CBC today   5. ESRD:  - HD MWF.   F/u w/ MD in 6-8 weeks   Lyda Jester, PA-C 05/19/19

## 2019-05-19 NOTE — Patient Instructions (Addendum)
CONTINUE Bisoprolol 5 mg, on NON Dialysis days (Tu,Thurs,Sat,Sun) DECREASE Amiodarone to 200 mg, one tab daily   Labs today We will only contact you if something comes back abnormal or we need to make some changes. Otherwise no news is good news!  Labs needed in 6 weeks  Your physician recommends that you schedule a follow-up appointment in: 4 weeks with Dr. Aundra Dubin  Do the following things EVERYDAY: 1) Weigh yourself in the morning before breakfast. Write it down and keep it in a log. 2) Take your medicines as prescribed 3) Eat low salt foods-Limit salt (sodium) to 2000 mg per day.  4) Stay as active as you can everyday 5) Limit all fluids for the day to less than 2 liters  At the [facility], you and your health needs are our priority. As part of our continuing mission to provide you with exceptional heart care, we have created designated Provider Care Teams. These Care Teams include your primary Cardiologist (physician) and Advanced Practice Providers (APPs- Physician Assistants and Nurse Practitioners) who all work together to provide you with the care you need, when you need it.   You may see any of the following providers on your designated Care Team at your next follow up: Marland Kitchen Dr Glori Bickers . Dr Loralie Champagne . Darrick Grinder, NP . Lyda Jester, PA   Please be sure to bring in all your medications bottles to every appointment.

## 2019-05-20 ENCOUNTER — Other Ambulatory Visit: Payer: Self-pay | Admitting: Thoracic Surgery (Cardiothoracic Vascular Surgery)

## 2019-05-20 ENCOUNTER — Other Ambulatory Visit (HOSPITAL_COMMUNITY): Payer: Self-pay

## 2019-05-20 DIAGNOSIS — Z951 Presence of aortocoronary bypass graft: Secondary | ICD-10-CM

## 2019-05-20 NOTE — Progress Notes (Signed)
Pt no show Covid testing appt 05/19/19. Attempted to call patient to reschedule. Left voicemail to return call

## 2019-05-21 ENCOUNTER — Encounter: Payer: Medicare Other | Admitting: Registered Nurse

## 2019-05-21 ENCOUNTER — Ambulatory Visit (HOSPITAL_COMMUNITY): Admission: RE | Admit: 2019-05-21 | Payer: Medicare Other | Source: Home / Self Care | Admitting: Vascular Surgery

## 2019-05-21 ENCOUNTER — Encounter (HOSPITAL_COMMUNITY): Admission: RE | Payer: Self-pay | Source: Home / Self Care

## 2019-05-21 SURGERY — A/V FISTULAGRAM
Anesthesia: LOCAL | Laterality: Right

## 2019-05-22 ENCOUNTER — Other Ambulatory Visit: Payer: Self-pay

## 2019-05-22 ENCOUNTER — Ambulatory Visit
Admission: RE | Admit: 2019-05-22 | Discharge: 2019-05-22 | Disposition: A | Discharge status: Home / Self Care | Payer: Medicare Other | Source: Ambulatory Visit | Attending: Thoracic Surgery (Cardiothoracic Vascular Surgery) | Admitting: Thoracic Surgery (Cardiothoracic Vascular Surgery)

## 2019-05-22 ENCOUNTER — Encounter (HOSPITAL_COMMUNITY): Payer: Medicare Other

## 2019-05-22 ENCOUNTER — Encounter: Payer: Self-pay | Admitting: Thoracic Surgery (Cardiothoracic Vascular Surgery)

## 2019-05-22 ENCOUNTER — Ambulatory Visit (INDEPENDENT_AMBULATORY_CARE_PROVIDER_SITE_OTHER): Payer: Self-pay | Admitting: Thoracic Surgery (Cardiothoracic Vascular Surgery)

## 2019-05-22 VITALS — BP 76/54 | HR 80 | Temp 98.1°F | Resp 20 | Ht 72.0 in | Wt 286.0 lb

## 2019-05-22 DIAGNOSIS — Z951 Presence of aortocoronary bypass graft: Secondary | ICD-10-CM

## 2019-05-22 DIAGNOSIS — I2510 Atherosclerotic heart disease of native coronary artery without angina pectoris: Secondary | ICD-10-CM

## 2019-05-22 MED ORDER — CINACALCET HCL 30 MG PO TABS: 30.0000 mg | ORAL_TABLET | Freq: Every day | ORAL | 0 refills | Status: DC

## 2019-05-22 NOTE — Progress Notes (Signed)
      Alex Williams       [place] [address]             [phone]        Bader A Cham Alex Williams Medical Record [id number] Date of Birth: 28-Jul-1950  Referring: Alex Guys, MD Primary Care: Alex Guys, MD Primary Cardiologist:No primary care provider on file.  Reason for visit:   follow-up  History of Present Illness:     [name] presents for his 1 month follow-up visit.  He has been out of rehab since October 17, for the most part is doing well.  He continues to receive home health and home physical therapy.  His hemodialysis Monday Wednesday and Friday.  He only complains of some left weakness and nerve distribution.  Other also some paresthesias occasionally shoot up his neck sneezes or coughs.  He also describes paresthesias in the right hand.  Physical Exam: BP (!) 76/54   Pulse 80   Temp 98.1 F (36.7 C) (Skin)   Resp 20   Ht 6' (1.829 m)   Wt 286 lb (129.7 kg)   SpO2 90% Comment: RA  BMI 38.79 kg/m   Alert NAD Incision clean.  Sternum stable Abdomen soft, NT/ND trace peripheral edema   Diagnostic Studies & Laboratory data: CXR: clear.  Small effusion on the left     Assessment / Plan:   68 year old male status post emergency CABG now for a month out.  For the most part he is done well Cleared for cardiac rehab Return to clinic as needed.   Lajuana Matte 05/22/2019 9:54 AM

## 2019-05-26 ENCOUNTER — Telehealth: Payer: Self-pay | Admitting: Registered Nurse

## 2019-05-26 ENCOUNTER — Encounter: Payer: Medicare Other | Attending: Registered Nurse | Admitting: Registered Nurse

## 2019-05-26 ENCOUNTER — Other Ambulatory Visit: Payer: Self-pay

## 2019-05-26 VITALS — BP 89/57 | HR 73 | Temp 97.7°F | Ht 72.0 in | Wt 283.0 lb

## 2019-05-26 DIAGNOSIS — I4891 Unspecified atrial fibrillation: Secondary | ICD-10-CM | POA: Diagnosis present

## 2019-05-26 DIAGNOSIS — N186 End stage renal disease: Secondary | ICD-10-CM

## 2019-05-26 DIAGNOSIS — Z951 Presence of aortocoronary bypass graft: Secondary | ICD-10-CM | POA: Diagnosis present

## 2019-05-26 DIAGNOSIS — M792 Neuralgia and neuritis, unspecified: Secondary | ICD-10-CM

## 2019-05-26 DIAGNOSIS — I2102 ST elevation (STEMI) myocardial infarction involving left anterior descending coronary artery: Secondary | ICD-10-CM

## 2019-05-26 DIAGNOSIS — Z992 Dependence on renal dialysis: Secondary | ICD-10-CM

## 2019-05-26 DIAGNOSIS — I953 Hypotension of hemodialysis: Secondary | ICD-10-CM

## 2019-05-26 DIAGNOSIS — R5381 Other malaise: Secondary | ICD-10-CM | POA: Diagnosis present

## 2019-05-26 NOTE — Telephone Encounter (Signed)
Mr. Claffey had questions about his allopurinol, this provider spoke with Reesa Chew PA. Mr. Butner was receiving allopurinol daily while in hospital. Prior to hospitalization he was receiving Uloric. Placed a call to Mr. Canterberry he reports he was using the Uloric as needed, he was instructed to call and speak with his Nephrologist [name]. Regarding his insomnia he was instructed to take his Tramadol at [facility] and call office by Friday on 05/29/2019, he verbalizes understanding.  Mr. Dentino was asked to re-check his blood pressure: Blood Pressure was 90/58.

## 2019-05-26 NOTE — Progress Notes (Signed)
Subjective:    Patient ID: Alex Williams, male    DOB: 07-13-1951, 68 y.o.   MRN: [id number]  HPI: Alex Williams is a 68 y.o. male who  Is here for transitional care visit in follow of his debility, acute ST elevation MI (STEMI) involving LAD, S/P CABG x3, ESRD on hemodialysis, hemodialysis associated hypotension and left hand Neuropathic pain.   On 04/16/2019 presented to [facility] on 04/16/2019 with complaints of chest pain and palpitations. EKG was performed, ST elevation Code STEMI Called . On 04/16/2019 he underwent: Dr. Kipp Brood and Dr. Nils Pyle CORONARY ARTERY BYPASS GRAFTING (CABG)X3 , WITH ENDOSCOPIC HARVESTING OF RIGHT GREATER SAPHENOUS VEIN N/A General  TRANSESOPHAGEAL ECHOCARDIOGRAM (TEE     He was admitted to Inpatient Rehabilitation on 04/30/2019 and discharged home on 05/08/2019. He is receiving outpatient therapy with [facility]. He states he has post surgical sternal pain, incision site healing. He rates his pain 1. Also reports he has a good appetite.    Pain Inventory Average Pain 2 Pain Right Now 1 My pain is intermittent, dull and tingling  In the last 24 hours, has pain interfered with the following? General activity 0 Relation with others 0 Enjoyment of life 10 What TIME of day is your pain at its worst? night Sleep (in general) Fair  Pain is worse with: standing and some activites Pain improves with: rest and pacing activities Relief from Meds: 0  Mobility walk with assistance use a walker how many minutes can you walk? 3-5 ability to climb steps?  no do you drive?  no use a wheelchair needs help with transfers  Function retired I need assistance with the following:  dressing, meal prep, household duties and shopping  Neuro/Psych weakness numbness tingling trouble walking dizziness confusion anxiety  Prior Studies x-rays  Physicians involved in your care Surgeon-Dr. Kipp Brood   Family History  Problem Relation  Age of Onset  . Hypertension Other   . Diabetes Sister   . Hyperlipidemia Sister   . Sleep apnea Sister    Social History   Socioeconomic History  . Marital status: Legally Separated    Spouse name: Not on file  . Number of children: 2  . Years of education: Not on file  . Highest education level: Not on file  Occupational History  . Not on file  Social Needs  . Financial resource strain: Not hard at all  . Food insecurity    Worry: Never true    Inability: Never true  . Transportation needs    Medical: No    Non-medical: No  Tobacco Use  . Smoking status: Former Smoker    Quit date: 07/23/1980    Years since quitting: 38.8  . Smokeless tobacco: Never Used  Substance and Sexual Activity  . Alcohol use: Yes    Alcohol/week: 1.0 standard drinks    Types: 1 Cans of beer per week    Comment: rare  . Drug use: No  . Sexual activity: Not Currently  Lifestyle  . Physical activity    Days per week: 2 days    Minutes per session: 20 min  . Stress: Not at all  Relationships  . Social Herbalist on phone: Three times a week    Gets together: More than three times a week    Attends religious service: More than 4 times per year    Active member of club or organization: No    Attends meetings  of clubs or organizations: Never    Relationship status: Patient refused  Other Topics Concern  . Not on file  Social History Narrative   Pt lives alone. He has one son who lives within 3 miles who he usually sees daily. He has two sisters living in the next town over who he also sees on a weekly basis. He is a religious man who attends church at least 3 Sundays each month.   He operates [employer] full time.    Past Surgical History:  Procedure Laterality Date  . AV FISTULA PLACEMENT Right 01/19/2019   Procedure: RIGHT BRACHIOCEPHALIC ARTERIOVENOUS (AV) FISTULA CREATION;  Surgeon: Angelia Mould, MD;  Location: [place];  Service: Vascular;  Laterality: Right;  . CARDIAC  CATHETERIZATION    . CARPAL TUNNEL RELEASE     left hand  . COLONOSCOPY     with polypectomy  . CORONARY ARTERY BYPASS GRAFT N/A 04/16/2019   Procedure: CORONARY ARTERY BYPASS GRAFTING (CABG)X3  , WITH ENDOSCOPIC HARVESTING OF RIGHT GREATER SAPHENOUS VEIN;  Surgeon: Lajuana Matte, MD;  Location: [facility];  Service: Open Heart Surgery;  Laterality: N/A;  . CORONARY/GRAFT ACUTE MI REVASCULARIZATION N/A 04/16/2019   Procedure: CORONARY/GRAFT ACUTE MI REVASCULARIZATION;  Surgeon: Burnell Blanks, MD;  Location: [place];  Service: Cardiovascular;  Laterality: N/A;  . INSERTION OF DIALYSIS CATHETER N/A 04/29/2019   Procedure: INSERTION OF TUNNELED DIALYSIS CATHETER, right internal jugular;  Surgeon: Rosetta Posner, MD;  Location: [facility];  Service: Vascular;  Laterality: N/A;  . JOINT REPLACEMENT Left 2005   knee  . LUMBAR LAMINECTOMY    . MULTIPLE TOOTH EXTRACTIONS    . RADIOFREQUENCY ABLATION KIDNEY    . RIGHT/LEFT HEART CATH AND CORONARY ANGIOGRAPHY N/A 04/16/2019   Procedure: RIGHT/LEFT HEART CATH AND CORONARY ANGIOGRAPHY;  Surgeon: Burnell Blanks, MD;  Location: [place] [facility];  Service: Cardiovascular;  Laterality: N/A;  . TEE WITHOUT CARDIOVERSION N/A 04/16/2019   Procedure: TRANSESOPHAGEAL ECHOCARDIOGRAM (TEE);  Surgeon: Lajuana Matte, MD;  Location: [facility];  Service: Open Heart Surgery;  Laterality: N/A;  . TOTAL KNEE ARTHROPLASTY     left  . VENTRICULAR ASSIST DEVICE INSERTION N/A 04/16/2019   Procedure: VENTRICULAR ASSIST DEVICE INSERTION;  Surgeon: Burnell Blanks, MD;  Location: [place] [facility];  Service: Cardiovascular;  Laterality: N/A;   Past Medical History:  Diagnosis Date  . Cervical disc disease   . Chronic kidney disease   . COLONIC POLYPS, HX OF 06/27/2007  . EXOGENOUS OBESITY 01/30/2010  . GERD (gastroesophageal reflux disease)    PMH  . GOUT 01/30/2010  . HYPERCHOLESTEROLEMIA 06/30/2007  . HYPERTENSION 06/27/2007  . LOW BACK  PAIN 06/27/2007  . NEPHROLITHIASIS, HX OF 06/27/2007  . OSTEOARTHRITIS 06/27/2007  . SLEEP APNEA, OBSTRUCTIVE, MODERATE 01/27/2009  . Wears dentures    upper   BP (!) 89/57   Pulse 73   Temp 97.7 F (36.5 C)   Ht 6' (1.829 m)   Wt 283 lb (128.4 kg)   SpO2 98%   BMI 38.38 kg/m   Opioid Risk Score:   Fall Risk Score:  `1  Depression screen PHQ 2/9  Depression screen [facility] 2/9 05/26/2019 03/03/2019 01/26/2019 12/17/2018 12/12/2018 12/09/2018 12/05/2018  Decreased Interest 1 0 0 0 0 0 0  Down, Depressed, Hopeless 0 0 0 0 0 0 0  PHQ - 2 Score 1 0 0 0 0 0 0  Altered sleeping 1 - - - - - -  Tired, decreased energy 3 - - - - - -  Change in appetite 0 - - - - - -  Feeling bad or failure about yourself  0 - - - - - -  Trouble concentrating 3 - - - - - -  Moving slowly or fidgety/restless 3 - - - - - -  Suicidal thoughts 0 - - - - - -  PHQ-9 Score 11 - - - - - -  Difficult doing work/chores Extremely dIfficult - - - - - -  Some recent data might be hidden   Review of Systems  Constitutional: Positive for chills and unexpected weight change.  Respiratory: Positive for apnea and wheezing.   Gastrointestinal: Positive for constipation.  Musculoskeletal: Positive for gait problem.  Neurological: Positive for dizziness, weakness and numbness.  Psychiatric/Behavioral: Positive for confusion. The patient is nervous/anxious.        Objective:   Physical Exam Vitals signs and nursing note reviewed.  Constitutional:      Appearance: Normal appearance.  Neck:     Musculoskeletal: Normal range of motion and neck supple.  Cardiovascular:     Rate and Rhythm: Normal rate and regular rhythm.     Pulses: Normal pulses.     Heart sounds: Normal heart sounds.  Pulmonary:     Effort: Pulmonary effort is normal.     Breath sounds: Normal breath sounds.  Musculoskeletal:     Comments: Normal Muscle Bulk and Muscle Testing Reveals:  Upper Extremities: Full ROM and Muscle Strength 5/5 Left AC Joint  tenderness'  Lower Extremities: Full ROM and Muscle Strength 5/5 Arises from Table with ease using walker for support Narrow Based Gait   Skin:    General: Skin is warm and dry.  Neurological:     Mental Status: He is alert and oriented to person, place, and time.  Psychiatric:        Mood and Affect: Mood normal.        Behavior: Behavior normal.           Assessment & Plan:  1. Debility: Continbue Outpatient Therapy with [facility].  2.Acute ST elevation MI (STEMI) involving LAD/S/P CABG x3: Continue Outpatient therapy. Mr. Manrriquez reports he seen Dr. Kipp Brood; [facility].  3. ESRD on hemodialysis/hemodialysis associated hypotension: On Hemodialysis and continue current medication regimen. Nephrology Following.  4. Left hand Neuropathic pain. Continue Lyrica PCP Following. Continue to Monitor.   4minutes of face to face patient care time was spent during this visit. All questions were encouraged and answered.  F/U with Dr. Dagoberto Ligas in 4-6 weeks

## 2019-05-29 ENCOUNTER — Telehealth (HOSPITAL_COMMUNITY): Payer: Self-pay

## 2019-05-29 ENCOUNTER — Encounter: Payer: Self-pay | Admitting: Registered Nurse

## 2019-05-29 NOTE — Telephone Encounter (Signed)
Called and spoke with pt in regards to CR, pt stated he does not wish to participate at this time.  Closed referral

## 2019-05-29 NOTE — Telephone Encounter (Signed)
Pt insurance is active and benefits verified through [facility] Medicare. Co-pay $20.00, DED $0.00/$0.00 met, out of pocket $3,600.00/$2,261.64 met, co-insurance 0%. No pre-authorization required. Passport, 05/29/2019 @ 3:51PM, PPJ#[id number]

## 2019-06-04 ENCOUNTER — Ambulatory Visit: Payer: Medicare Other | Admitting: Family Medicine

## 2019-06-04 ENCOUNTER — Ambulatory Visit (INDEPENDENT_AMBULATORY_CARE_PROVIDER_SITE_OTHER): Payer: Medicare Other | Admitting: Family Medicine

## 2019-06-04 ENCOUNTER — Encounter: Payer: Self-pay | Admitting: Family Medicine

## 2019-06-04 ENCOUNTER — Other Ambulatory Visit: Payer: Self-pay

## 2019-06-04 VITALS — BP 114/72 | HR 71 | Temp 98.3°F | Ht 72.0 in | Wt 278.6 lb

## 2019-06-04 DIAGNOSIS — Z23 Encounter for immunization: Secondary | ICD-10-CM | POA: Diagnosis not present

## 2019-06-04 DIAGNOSIS — G5632 Lesion of radial nerve, left upper limb: Secondary | ICD-10-CM | POA: Diagnosis not present

## 2019-06-04 DIAGNOSIS — N186 End stage renal disease: Secondary | ICD-10-CM | POA: Diagnosis not present

## 2019-06-04 DIAGNOSIS — Z9989 Dependence on other enabling machines and devices: Secondary | ICD-10-CM | POA: Diagnosis not present

## 2019-06-04 DIAGNOSIS — Z951 Presence of aortocoronary bypass graft: Secondary | ICD-10-CM | POA: Diagnosis not present

## 2019-06-04 DIAGNOSIS — Z992 Dependence on renal dialysis: Secondary | ICD-10-CM

## 2019-06-04 MED ORDER — SHINGRIX 50 MCG/0.5ML IM SUSR: 0.5000 mL | Freq: Once | INTRAMUSCULAR | 2 refills | Status: AC

## 2019-06-04 MED ORDER — GABAPENTIN 100 MG PO CAPS: ORAL_CAPSULE | ORAL | 2 refills | Status: DC

## 2019-06-04 NOTE — Progress Notes (Signed)
11/12/20203:06 PM  Alex Williams 07-Oct-1950, 68 y.o., male [id number]  Chief Complaint  Patient presents with  . Follow-up    open heart surgery 7 wk ago, follow up on kidney and htn.Wants flu and pnuemonia inj  . Flu Vaccine    wants rx for shingles    HPI:   Patient is a 68 y.o. male with past medical history significant for HTN, HLP, gout, CKD on HD, MI s/p 3V CABG who presents today for routine followup  Last OV Aug 2020 Had MI sept 2020, s/p 3V CBAG LVEF 30% Cleared by cardiothoracic surgeon for cardiac rehab Has established with cards, last OV Oct 2020  Concerns for RUE fistula narrowing - plans for fistulogram HD on M/W/F - having to use tunneled dialysis catheter  Using walker, PT thru [facility], improving Good appetite, urinating  Requesting handicap placard  Since CABG having weakness of left wrist and 3rd,4th, 5th fingers Improving, able to lift wrist now Still having issues with fingers Having painful burning pain at night  Depression screen [facility] 2/9 06/04/2019 05/26/2019 03/03/2019  Decreased Interest 0 1 0  Down, Depressed, Hopeless 0 0 0  PHQ - 2 Score 0 1 0  Altered sleeping - 1 -  Tired, decreased energy - 3 -  Change in appetite - 0 -  Feeling bad or failure about yourself  - 0 -  Trouble concentrating - 3 -  Moving slowly or fidgety/restless - 3 -  Suicidal thoughts - 0 -  PHQ-9 Score - 11 -  Difficult doing work/chores - Extremely dIfficult -  Some recent data might be hidden    Fall Risk  06/04/2019 05/26/2019 03/03/2019 01/26/2019 12/17/2018  Falls in the past year? 1 1 0 0 0  Number falls in past yr: 0 0 0 0 0  Comment - last fall 2 weeks ago - - -  Injury with Fall? 0 - 0 0 0  Comment - - - - -  Risk for fall due to : History of fall(s);Impaired balance/gait;Impaired mobility;Impaired vision;Orthopedic patient - - - -  Follow up - - - - -     Allergies  Allergen Reactions  . Codeine Phosphate Other (See Comments)    Hyperactive     Prior  to Admission medications   Medication Sig Start Date End Date Taking? Authorizing Provider  allopurinol (ZYLOPRIM) 100 MG tablet Take 1 tablet (100 mg total) by mouth daily. 05/08/19  Yes Angiulli, Lavon Paganini, PA-C  amiodarone (PACERONE) 200 MG tablet Take 1 tablet (200 mg total) by mouth daily. 05/19/19  Yes Lyda Jester M, PA-C  apixaban (ELIQUIS) 5 MG TABS tablet Take 1 tablet (5 mg total) by mouth 2 (two) times daily. 05/07/19  Yes Angiulli, Lavon Paganini, PA-C  aspirin EC 81 MG EC tablet Take 1 tablet (81 mg total) by mouth daily. 05/01/19  Yes Conte, Tessa N, PA-C  B Complex-C (B-COMPLEX WITH VITAMIN C) tablet Take 1 tablet by mouth daily. 05/01/19  Yes Conte, Tessa N, PA-C  bisoprolol (ZEBETA) 5 MG tablet Take 5 mg by mouth as directed. On NON Dialysis days (T,Th,Sat,Sun)   Yes [provider]  cinacalcet (SENSIPAR) 30 MG tablet Take 1 tablet (30 mg total) by mouth daily with supper. 05/22/19  Yes Lightfoot, Lucile Crater, MD  diclofenac sodium (VOLTAREN) 1 % GEL Apply 2 g topically 4 (four) times daily. 05/07/19  Yes Angiulli, Lavon Paganini, PA-C  fluticasone (FLONASE) 50 MCG/ACT nasal spray Place 2 sprays into both  nostrils daily. Patient taking differently: Place 2 sprays into both nostrils daily as needed for allergies or rhinitis.  09/16/18  Yes [name], MD  midodrine (PROAMATINE) 10 MG tablet Take 1 tablet (10 mg total) by mouth every Monday, Wednesday, and Friday with hemodialysis. 05/08/19  Yes Angiulli, Lavon Paganini, PA-C  multivitamin (RENA-VIT) TABS tablet Take 1 tablet by mouth at bedtime. 05/07/19  Yes Angiulli, Lavon Paganini, PA-C  pantoprazole (PROTONIX) 40 MG tablet Take 1 tablet (40 mg total) by mouth daily. 05/07/19  Yes Angiulli, Lavon Paganini, PA-C  pregabalin (LYRICA) 50 MG capsule Take 1 capsule (50 mg total) by mouth 2 (two) times daily. 05/07/19  Yes Angiulli, Lavon Paganini, PA-C  psyllium (HYDROCIL/METAMUCIL) 95 % PACK Take 1 packet by mouth 2 (two) times daily. 05/07/19  Yes  Angiulli, Lavon Paganini, PA-C  rosuvastatin (CRESTOR) 10 MG tablet Take 1 tablet (10 mg total) by mouth daily at 6 PM. 05/07/19  Yes Angiulli, Lavon Paganini, PA-C  sevelamer carbonate (RENVELA) 800 MG tablet Take 1 tablet (800 mg total) by mouth 3 (three) times daily with meals. 05/07/19  Yes Angiulli, Lavon Paganini, PA-C  traMADol (ULTRAM) 50 MG tablet Take 1 tablet (50 mg total) by mouth every 12 (twelve) hours as needed for moderate pain. 05/07/19  Yes Angiulli, Lavon Paganini, PA-C    Past Medical History:  Diagnosis Date  . Cervical disc disease   . Chronic kidney disease   . COLONIC POLYPS, HX OF 06/27/2007  . EXOGENOUS OBESITY 01/30/2010  . GERD (gastroesophageal reflux disease)    PMH  . GOUT 01/30/2010  . HYPERCHOLESTEROLEMIA 06/30/2007  . HYPERTENSION 06/27/2007  . LOW BACK PAIN 06/27/2007  . NEPHROLITHIASIS, HX OF 06/27/2007  . OSTEOARTHRITIS 06/27/2007  . SLEEP APNEA, OBSTRUCTIVE, MODERATE 01/27/2009  . Wears dentures    upper    Past Surgical History:  Procedure Laterality Date  . AV FISTULA PLACEMENT Right 01/19/2019   Procedure: RIGHT BRACHIOCEPHALIC ARTERIOVENOUS (AV) FISTULA CREATION;  Surgeon: Angelia Mould, MD;  Location: [facility];  Service: Vascular;  Laterality: Right;  . CARDIAC CATHETERIZATION    . CARPAL TUNNEL RELEASE     left hand  . COLONOSCOPY     with polypectomy  . CORONARY ARTERY BYPASS GRAFT N/A 04/16/2019   Procedure: CORONARY ARTERY BYPASS GRAFTING (CABG)X3  , WITH ENDOSCOPIC HARVESTING OF RIGHT GREATER SAPHENOUS VEIN;  Surgeon: Lajuana Matte, MD;  Location: [place];  Service: Open Heart Surgery;  Laterality: N/A;  . CORONARY/GRAFT ACUTE MI REVASCULARIZATION N/A 04/16/2019   Procedure: CORONARY/GRAFT ACUTE MI REVASCULARIZATION;  Surgeon: Burnell Blanks, MD;  Location: [facility];  Service: Cardiovascular;  Laterality: N/A;  . INSERTION OF DIALYSIS CATHETER N/A 04/29/2019   Procedure: INSERTION OF TUNNELED DIALYSIS CATHETER, right internal jugular;   Surgeon: Rosetta Posner, MD;  Location: [facility];  Service: Vascular;  Laterality: N/A;  . JOINT REPLACEMENT Left 2005   knee  . LUMBAR LAMINECTOMY    . MULTIPLE TOOTH EXTRACTIONS    . RADIOFREQUENCY ABLATION KIDNEY    . RIGHT/LEFT HEART CATH AND CORONARY ANGIOGRAPHY N/A 04/16/2019   Procedure: RIGHT/LEFT HEART CATH AND CORONARY ANGIOGRAPHY;  Surgeon: Burnell Blanks, MD;  Location: [facility];  Service: Cardiovascular;  Laterality: N/A;  . TEE WITHOUT CARDIOVERSION N/A 04/16/2019   Procedure: TRANSESOPHAGEAL ECHOCARDIOGRAM (TEE);  Surgeon: Lajuana Matte, MD;  Location: [facility];  Service: Open Heart Surgery;  Laterality: N/A;  . TOTAL KNEE ARTHROPLASTY     left  .  VENTRICULAR ASSIST DEVICE INSERTION N/A 04/16/2019   Procedure: VENTRICULAR ASSIST DEVICE INSERTION;  Surgeon: Burnell Blanks, MD;  Location: [facility];  Service: Cardiovascular;  Laterality: N/A;    Social History   Tobacco Use  . Smoking status: Former Smoker    Quit date: 07/23/1980    Years since quitting: 38.8  . Smokeless tobacco: Never Used  Substance Use Topics  . Alcohol use: Yes    Alcohol/week: 1.0 standard drinks    Types: 1 Cans of beer per week    Comment: rare    Family History  Problem Relation Age of Onset  . Hypertension Other   . Diabetes Sister   . Hyperlipidemia Sister   . Sleep apnea Sister     Review of Systems  Constitutional: Negative for chills and fever.  Respiratory: Negative for cough and shortness of breath.   Cardiovascular: Negative for chest pain, palpitations and leg swelling.  Gastrointestinal: Negative for abdominal pain, nausea and vomiting.     OBJECTIVE:  Today's Vitals   06/04/19 1447  BP: 114/72  Pulse: 71  Temp: 98.3 F (36.8 C)  SpO2: 94%  Weight: 278 lb 9.6 oz (126.4 kg)  Height: 6' (1.829 m)   Body mass index is 37.78 kg/m.   Physical Exam Vitals signs and nursing note reviewed.  Constitutional:      Appearance: He is  well-developed.  HENT:     Head: Normocephalic and atraumatic.  Eyes:     Conjunctiva/sclera: Conjunctivae normal.     Pupils: Pupils are equal, round, and reactive to light.  Neck:     Musculoskeletal: Neck supple.  Cardiovascular:     Rate and Rhythm: Normal rate and regular rhythm.     Heart sounds: No murmur. No friction rub. No gallop.      Comments: Sternotomy scar well healed Right dialysis catheter dressing c/d/i Pulmonary:     Effort: Pulmonary effort is normal.     Breath sounds: Normal breath sounds. No wheezing, rhonchi or rales.  Musculoskeletal:     Left hand: Decreased strength noted. He exhibits wrist extension trouble (3-5th finger extension).     Right lower leg: No edema.     Left lower leg: No edema.     Comments: Wearing compression stockings RUE fistula with thrill and bruit  Skin:    General: Skin is warm and dry.  Neurological:     Mental Status: He is alert and oriented to person, place, and time.     Gait: Gait abnormal (using walker).  Psychiatric:        Mood and Affect: Mood normal.        Behavior: Behavior normal.     No results found for this or any previous visit (from the past 24 hour(s)).  No results found.   ASSESSMENT and PLAN  1. 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 Improving. Gabapentin, renally dosed, for pain. Continue with home PT  2. ESRD on dialysis [facility]) Managed by renal. Doing well. Will reschedule fistulogram.  3. S/P CABG x 3 Managed by cards. Doing well with cardiac rehab.    4. Uses walker Disability placard given  5. Need for influenza vaccination - Flu Vaccine QUAD High Dose(Fluad)   Other orders - gabapentin (NEURONTIN) 100 MG capsule; Take 100mg  by mouth at bedtime on HD days (M/W/F) - Zoster Vaccine Adjuvanted [facility]) injection; Inject 0.5 mLs into the muscle once for 1 dose.  Return in about 3 months (  around 09/04/2019).    [name], MD Primary Care at  [facility] [place], [place] [address] Ph.  [phone] Fax [phone]

## 2019-06-04 NOTE — Patient Instructions (Signed)
° ° ° °  If you have lab work done today you will be contacted with your lab results within the next 2 weeks.  If you have not heard from us then please contact us. The fastest way to get your results is to register for My Chart. ° ° °IF you received an x-ray today, you will receive an invoice from [facility]. Please contact [facility] at [phone] with questions or concerns regarding your invoice.  ° °IF you received labwork today, you will receive an invoice from [facility]. Please contact [facility] at [phone] with questions or concerns regarding your invoice.  ° °Our billing staff will not be able to assist you with questions regarding bills from these companies. ° °You will be contacted with the lab results as soon as they are available. The fastest way to get your results is to activate your My Chart account. Instructions are located on the last page of this paperwork. If you have not heard from us regarding the results in 2 weeks, please contact this office. °  ° ° ° °

## 2019-06-06 ENCOUNTER — Other Ambulatory Visit: Payer: Self-pay | Admitting: Family Medicine

## 2019-06-07 NOTE — Telephone Encounter (Signed)
Requested medication (s) are due for refill today: yes  Requested medication (s) are on the active medication list: yes  Last refill:  05/07/2019  Future visit scheduled: yes  Notes to clinic:  Last filled by different provider Review for refill   Requested Prescriptions  Pending Prescriptions Disp Refills   sevelamer carbonate (RENVELA) 800 MG tablet [Pharmacy Med Name: SEVELAMER CARBONATE 800 MG TAB] 90 tablet 0    Sig: Take 1 tablet (800 mg total) by mouth 3 (three) times daily with meals.     Endocrinology:  Phosphate Binders Failed - 06/06/2019  9:59 AM      Failed - Albumin in normal range and within 360 days    Albumin  Date Value Ref Range Status  05/06/2019 2.2 (L) 3.5 - 5.0 g/dL Final  12/02/2018 3.9 3.8 - 4.8 g/dL Final         Failed - Cr in normal range and within 360 days    Creat  Date Value Ref Range Status  09/23/2015 2.19 (H) 0.70 - 1.25 mg/dL Final   Creatinine, Ser  Date Value Ref Range Status  05/06/2019 5.62 (H) 0.61 - 1.24 mg/dL Final         Failed - PTH in normal range and within 360 days    No results found for: IOPTH, PTHINTACTFNA, PTH       Passed - Ca in normal range and within 360 days    Calcium  Date Value Ref Range Status  05/06/2019 9.2 8.9 - 10.3 mg/dL Final         Passed - Phosphate in normal range and within 360 days    Phosphorus  Date Value Ref Range Status  05/06/2019 3.8 2.5 - 4.6 mg/dL Final         Passed - Valid encounter within last 12 months    Recent Outpatient Visits          3 days ago Radial neuropathy, left   Primary Care at [facility], Lilia Argue, MD   3 months ago Stage 5 chronic kidney disease not on chronic dialysis [facility])   [facility] at [facility], Lilia Argue, MD   4 months ago CKD (chronic kidney disease) stage 5, GFR less than 15 ml/min [facility])   [facility] at [facility], Lilia Argue, MD   4 months ago DOE (dyspnea on exertion)   [facility] at [facility], Lilia Argue, MD   5 months ago Right  leg swelling   [facility] at [facility], Ranell Patrick, MD      Future Appointments            In 2 months [name], MD Primary Care at [place], [facility]

## 2019-06-09 ENCOUNTER — Telehealth: Payer: Self-pay

## 2019-06-09 NOTE — Telephone Encounter (Signed)
Spoke with Melissa with [facility] at [place]. Doctor approved pt, ot and home safety measures pt needs post surgery for heart cath

## 2019-06-11 ENCOUNTER — Ambulatory Visit (HOSPITAL_COMMUNITY)
Admission: RE | Admit: 2019-06-11 | Discharge: 2019-06-11 | Disposition: A | Discharge status: Home / Self Care | Payer: Medicare Other | Source: Ambulatory Visit | Attending: Cardiology | Admitting: Cardiology

## 2019-06-11 ENCOUNTER — Other Ambulatory Visit: Payer: Self-pay

## 2019-06-11 ENCOUNTER — Encounter (HOSPITAL_COMMUNITY): Payer: Self-pay | Admitting: Cardiology

## 2019-06-11 VITALS — BP 92/56 | HR 67 | Wt 277.0 lb

## 2019-06-11 DIAGNOSIS — I2510 Atherosclerotic heart disease of native coronary artery without angina pectoris: Secondary | ICD-10-CM | POA: Diagnosis not present

## 2019-06-11 DIAGNOSIS — Z79899 Other long term (current) drug therapy: Secondary | ICD-10-CM | POA: Diagnosis not present

## 2019-06-11 DIAGNOSIS — Z7982 Long term (current) use of aspirin: Secondary | ICD-10-CM | POA: Insufficient documentation

## 2019-06-11 DIAGNOSIS — Z992 Dependence on renal dialysis: Secondary | ICD-10-CM | POA: Diagnosis not present

## 2019-06-11 DIAGNOSIS — I255 Ischemic cardiomyopathy: Secondary | ICD-10-CM | POA: Insufficient documentation

## 2019-06-11 DIAGNOSIS — Z951 Presence of aortocoronary bypass graft: Secondary | ICD-10-CM | POA: Insufficient documentation

## 2019-06-11 DIAGNOSIS — Z87891 Personal history of nicotine dependence: Secondary | ICD-10-CM | POA: Insufficient documentation

## 2019-06-11 DIAGNOSIS — I132 Hypertensive heart and chronic kidney disease with heart failure and with stage 5 chronic kidney disease, or end stage renal disease: Secondary | ICD-10-CM | POA: Diagnosis not present

## 2019-06-11 DIAGNOSIS — N186 End stage renal disease: Secondary | ICD-10-CM | POA: Insufficient documentation

## 2019-06-11 DIAGNOSIS — I252 Old myocardial infarction: Secondary | ICD-10-CM | POA: Insufficient documentation

## 2019-06-11 DIAGNOSIS — I480 Paroxysmal atrial fibrillation: Secondary | ICD-10-CM | POA: Insufficient documentation

## 2019-06-11 DIAGNOSIS — I4891 Unspecified atrial fibrillation: Secondary | ICD-10-CM | POA: Diagnosis present

## 2019-06-11 DIAGNOSIS — Z7901 Long term (current) use of anticoagulants: Secondary | ICD-10-CM | POA: Diagnosis not present

## 2019-06-11 DIAGNOSIS — E785 Hyperlipidemia, unspecified: Secondary | ICD-10-CM | POA: Insufficient documentation

## 2019-06-11 DIAGNOSIS — I5022 Chronic systolic (congestive) heart failure: Secondary | ICD-10-CM | POA: Diagnosis not present

## 2019-06-11 DIAGNOSIS — M109 Gout, unspecified: Secondary | ICD-10-CM | POA: Insufficient documentation

## 2019-06-11 DIAGNOSIS — Z885 Allergy status to narcotic agent status: Secondary | ICD-10-CM | POA: Insufficient documentation

## 2019-06-11 DIAGNOSIS — Z833 Family history of diabetes mellitus: Secondary | ICD-10-CM | POA: Insufficient documentation

## 2019-06-11 LAB — CBC
HCT: 22.2 % — ABNORMAL LOW (ref 39.0–52.0)
Hemoglobin: 6.2 g/dL — CL (ref 13.0–17.0)
MCH: 27.9 pg (ref 26.0–34.0)
MCHC: 27.9 g/dL — ABNORMAL LOW (ref 30.0–36.0)
MCV: 100 fL (ref 80.0–100.0)
Platelets: 248 10*3/uL (ref 150–400)
RBC: 2.22 MIL/uL — ABNORMAL LOW (ref 4.22–5.81)
RDW: 19.2 % — ABNORMAL HIGH (ref 11.5–15.5)
WBC: 8 10*3/uL (ref 4.0–10.5)
nRBC: 0 % (ref 0.0–0.2)

## 2019-06-11 LAB — COMPREHENSIVE METABOLIC PANEL
ALT: 17 U/L (ref 0–44)
AST: 18 U/L (ref 15–41)
Albumin: 2.6 g/dL — ABNORMAL LOW (ref 3.5–5.0)
Alkaline Phosphatase: 89 U/L (ref 38–126)
Anion gap: 15 (ref 5–15)
BUN: 31 mg/dL — ABNORMAL HIGH (ref 8–23)
CO2: 27 mmol/L (ref 22–32)
Calcium: 10 mg/dL (ref 8.9–10.3)
Chloride: 98 mmol/L (ref 98–111)
Creatinine, Ser: 5.38 mg/dL — ABNORMAL HIGH (ref 0.61–1.24)
GFR calc Af Amer: 12 mL/min — ABNORMAL LOW (ref >60)
GFR calc non Af Amer: 10 mL/min — ABNORMAL LOW (ref >60)
Glucose, Bld: 103 mg/dL — ABNORMAL HIGH (ref 70–99)
Potassium: 4.2 mmol/L (ref 3.5–5.1)
Sodium: 140 mmol/L (ref 135–145)
Total Bilirubin: 0.8 mg/dL (ref 0.3–1.2)
Total Protein: 5.3 g/dL — ABNORMAL LOW (ref 6.5–8.1)

## 2019-06-11 LAB — LIPID PANEL
Cholesterol: 111 mg/dL (ref 0–200)
HDL: 31 mg/dL — ABNORMAL LOW (ref >40)
LDL Cholesterol: 60 mg/dL (ref 0–99)
Total CHOL/HDL Ratio: 3.6 RATIO
Triglycerides: 99 mg/dL (ref <150)
VLDL: 20 mg/dL (ref 0–40)

## 2019-06-11 LAB — TSH: TSH: 2.341 u[IU]/mL (ref 0.350–4.500)

## 2019-06-11 MED ORDER — BISOPROLOL FUMARATE 5 MG PO TABS: 5.0000 mg | ORAL_TABLET | ORAL | 3 refills | Status: DC

## 2019-06-11 MED ORDER — APIXABAN 5 MG PO TABS: 5.0000 mg | ORAL_TABLET | Freq: Two times a day (BID) | ORAL | 1 refills | Status: DC

## 2019-06-11 MED ORDER — AMIODARONE HCL 200 MG PO TABS: 200.0000 mg | ORAL_TABLET | Freq: Every day | ORAL | 1 refills | Status: DC

## 2019-06-11 MED ORDER — ROSUVASTATIN CALCIUM 10 MG PO TABS: 10.0000 mg | ORAL_TABLET | Freq: Every day | ORAL | 1 refills | Status: DC

## 2019-06-11 NOTE — Patient Instructions (Addendum)
Routine lab work today. Will notify you of abnormal results  Follow up and echo in January. [place] code- [phone]

## 2019-06-11 NOTE — Progress Notes (Signed)
Advanced Heart Failure Clinic Note   Referring Physician: PCP: [name], MD HF Cardiology: Dr. Aundra Dubin  HPI:  68 y.o. with history of ESRD (started on HD in 2020), gout, HTN, hyperlipidemia was admitted to [facility] 04/16/19 for chest pain and palpitations.  On arrival to the ED, he was noted to be in atypical atrial flutter in the 130s-140s with acute anterior infarction. CODE STEMI activated.   He was brought to the cath lab and noted to be hypotensive with SBP in 80s. Norepinephrine was started and Impella was placed.  LHC and RHC were done showing mean RA 15, PA 68/32 mean 45, CI 3.3.  There was 90% distal left main stenosis into the LCx, totally occluded mid LAD after diagonal, 90% mild LCx, 50% ostial RCA, 99% ostial PDA.  POBA to mid LAD but unable to restore flow.   It was suspected that the LAD was a chronic occlusion and the onset of atypical aflutter with RVR drove chest pain and ischemia (via severe distal left main stenosis).  Hs-TnI returned at 1297. Echo showed EF 25% range with apical and peri-apical severe hypokinesis.   Patient was taken to the OR for urgent CABGx 3 by Dr. Kipp Brood. Unable to graft LAD, had LIMA-D2, SVG-OM2, and SVG-PDA. He required dobutamine and NE post op and was eventually weaned off and Impella removed. Afib/flutter treated w/ amiodarone and converted to NSR. Was placed on Eliquis for anticoagulation. Required CVVH for volume removal. Later transitioned to HD. Hypotension limited aggressive HF therapy. Was placed on midodrine to support BP during HD. Once stable, he was transferred to SNF for rehab.   He is now back at home.  He returns today for followup of CHF.  He has been doing well symptomatically, working with PT.  SBP runs low when checked in the upper arm but better from lower arm or leg.  He is getting midodrine 10 mg prior to HD sessions.  He is still taking bisoprolol on non-HD days.  He denies any lightheadedness or dizziness.  He is using a  walker, gets short of breath with "long walks."  No dyspnea walking into the office today.  Wants to start using cane but right ankle pain is limiting currently.  No chest pain.  Taking Eliquis, no BRBPR.   ECG: NSR, old anterior MI (personally reviewed).   PMH: 1. ESRD: MWF HD. 2. Gout 3. HTN 4. Hyperlipidemia 5. Anemia of renal disease.  6. Atypical atrial flutter: Noted 9/20.  7. Atrial fibrillation: Paroxysmal.  Noted after CABG in 9/20.  8. Chronic systolic CHF: Ischemic cardiomyopathy.   - Echo (9/20): EF 30%, akinesis of the distal 1/2-2/3 of LV.  9. CAD: LHC in 9/20 showed 90% dLM, totally occluded mLAD (probably CTO), 90% mid LCx, 99% PDA.  Attempted POBA LAD but unable to restore flow.  - CABG (9/20): SVG-PDA, SVG-OM2, LIMA-D2.  No good LAD target.   Review of Systems: All systems reviewed and negative except as per HPI.   Current Outpatient Medications  Medication Sig Dispense Refill  . allopurinol (ZYLOPRIM) 100 MG tablet Take 1 tablet (100 mg total) by mouth daily. 30 tablet 0  . amiodarone (PACERONE) 200 MG tablet Take 1 tablet (200 mg total) by mouth daily. 30 tablet 1  . apixaban (ELIQUIS) 5 MG TABS tablet Take 1 tablet (5 mg total) by mouth 2 (two) times daily. 60 tablet 1  . aspirin EC 81 MG EC tablet Take 1 tablet (81 mg total) by mouth  daily.    . B Complex-C (B-COMPLEX WITH VITAMIN C) tablet Take 1 tablet by mouth daily.    . bisoprolol (ZEBETA) 5 MG tablet Take 5 mg by mouth as directed. On NON Dialysis days (T,Th,Sat,Sun)    . diclofenac sodium (VOLTAREN) 1 % GEL Apply 2 g topically 4 (four) times daily. 2 g 0  . ferric citrate (AURYXIA) 1 GM 210 MG(Fe) tablet Take 210 mg by mouth 3 (three) times daily with meals.    . fluticasone (FLONASE) 50 MCG/ACT nasal spray Place 2 sprays into both nostrils daily. (Patient taking differently: Place 2 sprays into both nostrils daily as needed for allergies or rhinitis. ) 16 g 6  . gabapentin (NEURONTIN) 100 MG capsule Take  100mg  by mouth at bedtime on HD days (M/W/F) 30 capsule 2  . midodrine (PROAMATINE) 10 MG tablet Take 1 tablet (10 mg total) by mouth every Monday, Wednesday, and Friday with hemodialysis. 30 tablet 0  . multivitamin (RENA-VIT) TABS tablet Take 1 tablet by mouth at bedtime. 30 tablet 0  . pantoprazole (PROTONIX) 40 MG tablet Take 1 tablet (40 mg total) by mouth daily. 30 tablet 0  . pregabalin (LYRICA) 50 MG capsule Take 1 capsule (50 mg total) by mouth 2 (two) times daily. 60 capsule 1  . psyllium (HYDROCIL/METAMUCIL) 95 % PACK Take 1 packet by mouth 2 (two) times daily. 240 each 1  . rosuvastatin (CRESTOR) 10 MG tablet Take 1 tablet (10 mg total) by mouth daily at 6 PM. 60 tablet 1  . traMADol (ULTRAM) 50 MG tablet Take 1 tablet (50 mg total) by mouth every 12 (twelve) hours as needed for moderate pain. 30 tablet 0  . cinacalcet (SENSIPAR) 30 MG tablet Take 1 tablet (30 mg total) by mouth daily with supper. (Patient not taking: Reported on 06/11/2019) 60 tablet 0   No current facility-administered medications for this encounter.     Allergies  Allergen Reactions  . Codeine Phosphate Other (See Comments)    Hyperactive       Social History   Socioeconomic History  . Marital status: Legally Separated    Spouse name: Not on file  . Number of children: 2  . Years of education: Not on file  . Highest education level: Not on file  Occupational History  . Not on file  Social Needs  . Financial resource strain: Not hard at all  . Food insecurity    Worry: Never true    Inability: Never true  . Transportation needs    Medical: No    Non-medical: No  Tobacco Use  . Smoking status: Former Smoker    Quit date: 07/23/1980    Years since quitting: 38.9  . Smokeless tobacco: Never Used  Substance and Sexual Activity  . Alcohol use: Yes    Alcohol/week: 1.0 standard drinks    Types: 1 Cans of beer per week    Comment: rare  . Drug use: No  . Sexual activity: Not Currently  Lifestyle   . Physical activity    Days per week: 2 days    Minutes per session: 20 min  . Stress: Not at all  Relationships  . Social Herbalist on phone: Three times a week    Gets together: More than three times a week    Attends religious service: More than 4 times per year    Active member of club or organization: No    Attends meetings of clubs or organizations:  Never    Relationship status: Patient refused  . Intimate partner violence    Fear of current or ex partner: No    Emotionally abused: No    Physically abused: No    Forced sexual activity: No  Other Topics Concern  . Not on file  Social History Narrative   Pt lives alone. He has one son who lives within 3 miles who he usually sees daily. He has two sisters living in the next town over who he also sees on a weekly basis. He is a religious man who attends church at least 3 Sundays each month.   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       Family History  Problem Relation Age of Onset  . Hypertension Other   . Diabetes Sister   . Hyperlipidemia Sister   . Sleep apnea Sister     Vitals:   06/11/19 1134  BP: (!) 92/56  Pulse: 67  SpO2: 100%  Weight: 125.6 kg (277 lb)     PHYSICAL EXAM: General: NAD Neck: No JVD, no thyromegaly or thyroid nodule.  Lungs: Clear to auscultation bilaterally with normal respiratory effort. CV: Nondisplaced PMI.  Heart regular S1/S2, no S3/S4, 2/6 SEM RUSB.  No peripheral edema.  Abdomen: Soft, nontender, no hepatosplenomegaly, no distention.  Skin: Intact without lesions or rashes.  Neurologic: Alert and oriented x 3.  Psych: Normal affect. Extremities: No clubbing or cyanosis.  HEENT: Normal.    ASSESSMENT & PLAN:  1. CAD: Cardiogenic shock in setting of atrial flutter with RVR in 03/2019.  Probable CTO mid LAD with extensive disease in other arteries. Patient taken urgently for CABG supported by Impella. 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 Kipp Brood (unable to graft LAD,  LIMA-D2,  SVG-OM2, SVG-PDA).  No chest pain.  - Continue ASA 81.  - Continue Crestor 10 mg daily (maximum for ESRD). Check lipids today.  2. Chronic systolic CHF: Echo with EF in 30% range with peri-apical severe akinesis in 9/20. Ischemic cardiomyopathy. Now s/p CABG. NYHA class II but not very mobile due to ankle pain.  BP low measured from upper arm but ok from leg and forearm.  No orthostatic-type symptoms. Volume status looks optimized by HD.  - Repeat echo in 1/21 to see if EF has improved, if not consider ICD. Narrow QRS so not CRT candidate.  - Continue bisoprolol 5 mg on non-HD days. - BP too low to add additional cardiomyopathy meds.  - He gets midodrine pre-HD. Marland Kitchen   3. Atrial fibrillation/flutter: NSR on EKG. No palpitations.  - Continue amiodarone 200 mg daily. LFTs and TSH today, needs regular eye exam.  - On eliquis 5 mg twice a day. CBC today.  4. ESRD: HD MWF.   Followup 1/21 with echo.   Loralie Champagne, MD 06/11/19

## 2019-06-13 ENCOUNTER — Observation Stay (HOSPITAL_COMMUNITY)
Admission: EM | Admit: 2019-06-13 | Discharge: 2019-06-14 | Disposition: A | Discharge status: Home / Self Care | Payer: Medicare Other | Attending: Internal Medicine | Admitting: Internal Medicine

## 2019-06-13 ENCOUNTER — Other Ambulatory Visit: Payer: Self-pay

## 2019-06-13 ENCOUNTER — Encounter (HOSPITAL_COMMUNITY): Payer: Self-pay | Admitting: Emergency Medicine

## 2019-06-13 DIAGNOSIS — Z20828 Contact with and (suspected) exposure to other viral communicable diseases: Secondary | ICD-10-CM | POA: Insufficient documentation

## 2019-06-13 DIAGNOSIS — I2510 Atherosclerotic heart disease of native coronary artery without angina pectoris: Secondary | ICD-10-CM | POA: Diagnosis not present

## 2019-06-13 DIAGNOSIS — K219 Gastro-esophageal reflux disease without esophagitis: Secondary | ICD-10-CM | POA: Insufficient documentation

## 2019-06-13 DIAGNOSIS — Z7982 Long term (current) use of aspirin: Secondary | ICD-10-CM | POA: Insufficient documentation

## 2019-06-13 DIAGNOSIS — Z885 Allergy status to narcotic agent status: Secondary | ICD-10-CM | POA: Diagnosis not present

## 2019-06-13 DIAGNOSIS — Z8349 Family history of other endocrine, nutritional and metabolic diseases: Secondary | ICD-10-CM | POA: Insufficient documentation

## 2019-06-13 DIAGNOSIS — Z96652 Presence of left artificial knee joint: Secondary | ICD-10-CM | POA: Diagnosis not present

## 2019-06-13 DIAGNOSIS — M109 Gout, unspecified: Secondary | ICD-10-CM | POA: Diagnosis not present

## 2019-06-13 DIAGNOSIS — Z992 Dependence on renal dialysis: Secondary | ICD-10-CM | POA: Diagnosis not present

## 2019-06-13 DIAGNOSIS — Z791 Long term (current) use of non-steroidal anti-inflammatories (NSAID): Secondary | ICD-10-CM | POA: Insufficient documentation

## 2019-06-13 DIAGNOSIS — Z79899 Other long term (current) drug therapy: Secondary | ICD-10-CM | POA: Insufficient documentation

## 2019-06-13 DIAGNOSIS — Z8249 Family history of ischemic heart disease and other diseases of the circulatory system: Secondary | ICD-10-CM | POA: Insufficient documentation

## 2019-06-13 DIAGNOSIS — D631 Anemia in chronic kidney disease: Secondary | ICD-10-CM | POA: Diagnosis present

## 2019-06-13 DIAGNOSIS — E785 Hyperlipidemia, unspecified: Secondary | ICD-10-CM | POA: Insufficient documentation

## 2019-06-13 DIAGNOSIS — I1311 Hypertensive heart and chronic kidney disease without heart failure, with stage 5 chronic kidney disease, or end stage renal disease: Principal | ICD-10-CM | POA: Insufficient documentation

## 2019-06-13 DIAGNOSIS — M1990 Unspecified osteoarthritis, unspecified site: Secondary | ICD-10-CM | POA: Diagnosis not present

## 2019-06-13 DIAGNOSIS — G4733 Obstructive sleep apnea (adult) (pediatric): Secondary | ICD-10-CM | POA: Diagnosis not present

## 2019-06-13 DIAGNOSIS — Z87891 Personal history of nicotine dependence: Secondary | ICD-10-CM | POA: Diagnosis not present

## 2019-06-13 DIAGNOSIS — N186 End stage renal disease: Secondary | ICD-10-CM | POA: Insufficient documentation

## 2019-06-13 DIAGNOSIS — Z951 Presence of aortocoronary bypass graft: Secondary | ICD-10-CM | POA: Insufficient documentation

## 2019-06-13 DIAGNOSIS — D500 Iron deficiency anemia secondary to blood loss (chronic): Secondary | ICD-10-CM

## 2019-06-13 DIAGNOSIS — Z7901 Long term (current) use of anticoagulants: Secondary | ICD-10-CM | POA: Diagnosis not present

## 2019-06-13 DIAGNOSIS — E7800 Pure hypercholesterolemia, unspecified: Secondary | ICD-10-CM | POA: Insufficient documentation

## 2019-06-13 LAB — COMPREHENSIVE METABOLIC PANEL
ALT: 15 U/L (ref 0–44)
AST: 15 U/L (ref 15–41)
Albumin: 2.6 g/dL — ABNORMAL LOW (ref 3.5–5.0)
Alkaline Phosphatase: 87 U/L (ref 38–126)
Anion gap: 13 (ref 5–15)
BUN: 28 mg/dL — ABNORMAL HIGH (ref 8–23)
CO2: 31 mmol/L (ref 22–32)
Calcium: 10.2 mg/dL (ref 8.9–10.3)
Chloride: 97 mmol/L — ABNORMAL LOW (ref 98–111)
Creatinine, Ser: 4.87 mg/dL — ABNORMAL HIGH (ref 0.61–1.24)
GFR calc Af Amer: 13 mL/min — ABNORMAL LOW (ref >60)
GFR calc non Af Amer: 11 mL/min — ABNORMAL LOW (ref >60)
Glucose, Bld: 120 mg/dL — ABNORMAL HIGH (ref 70–99)
Potassium: 3.6 mmol/L (ref 3.5–5.1)
Sodium: 141 mmol/L (ref 135–145)
Total Bilirubin: 0.3 mg/dL (ref 0.3–1.2)
Total Protein: 5.3 g/dL — ABNORMAL LOW (ref 6.5–8.1)

## 2019-06-13 LAB — CBC
HCT: 21.6 % — ABNORMAL LOW (ref 39.0–52.0)
Hemoglobin: 6.2 g/dL — CL (ref 13.0–17.0)
MCH: 28.2 pg (ref 26.0–34.0)
MCHC: 28.7 g/dL — ABNORMAL LOW (ref 30.0–36.0)
MCV: 98.2 fL (ref 80.0–100.0)
Platelets: 234 10*3/uL (ref 150–400)
RBC: 2.2 MIL/uL — ABNORMAL LOW (ref 4.22–5.81)
RDW: 19.1 % — ABNORMAL HIGH (ref 11.5–15.5)
WBC: 7.9 10*3/uL (ref 4.0–10.5)
nRBC: 0 % (ref 0.0–0.2)

## 2019-06-13 LAB — HEMOGLOBIN AND HEMATOCRIT, BLOOD
HCT: 21.9 % — ABNORMAL LOW (ref 39.0–52.0)
Hemoglobin: 6.5 g/dL — CL (ref 13.0–17.0)

## 2019-06-13 LAB — PREPARE RBC (CROSSMATCH)

## 2019-06-13 LAB — POC SARS CORONAVIRUS 2 AG -  ED: SARS Coronavirus 2 Ag: NEGATIVE

## 2019-06-13 MED ORDER — MIDODRINE HCL 5 MG PO TABS
5.0000 mg | ORAL_TABLET | ORAL | Status: DC
  Administered 2019-06-13 – 2019-06-14 (×2): 5 mg via ORAL
  Filled 2019-06-13 (×2): qty 1

## 2019-06-13 MED ORDER — SODIUM CHLORIDE 0.9% FLUSH: 3.0000 mL | Freq: Two times a day (BID) | INTRAVENOUS | Status: DC

## 2019-06-13 MED ORDER — ROSUVASTATIN CALCIUM 5 MG PO TABS
10.0000 mg | ORAL_TABLET | Freq: Every day | ORAL | Status: DC
  Administered 2019-06-13: 10 mg via ORAL
  Filled 2019-06-13: qty 2

## 2019-06-13 MED ORDER — FLUTICASONE PROPIONATE 50 MCG/ACT NA SUSP
2.0000 | Freq: Every day | NASAL | Status: DC | PRN
  Filled 2019-06-13: qty 16

## 2019-06-13 MED ORDER — PREGABALIN 25 MG PO CAPS
50.0000 mg | ORAL_CAPSULE | Freq: Two times a day (BID) | ORAL | Status: DC
  Administered 2019-06-13 – 2019-06-14 (×2): 50 mg via ORAL
  Filled 2019-06-13 (×2): qty 2

## 2019-06-13 MED ORDER — SODIUM CHLORIDE 0.9 % IV BOLUS (SEPSIS)
500.0000 mL | Freq: Once | INTRAVENOUS | Status: AC
  Administered 2019-06-13: 500 mL via INTRAVENOUS

## 2019-06-13 MED ORDER — DICLOFENAC SODIUM 1 % TD GEL
2.0000 g | TRANSDERMAL | Status: DC | PRN
  Filled 2019-06-13: qty 100

## 2019-06-13 MED ORDER — ONDANSETRON HCL 4 MG PO TABS: 4.0000 mg | ORAL_TABLET | Freq: Four times a day (QID) | ORAL | Status: DC | PRN

## 2019-06-13 MED ORDER — ALLOPURINOL 100 MG PO TABS
100.0000 mg | ORAL_TABLET | Freq: Every day | ORAL | Status: DC
  Administered 2019-06-14: 100 mg via ORAL
  Filled 2019-06-13: qty 1

## 2019-06-13 MED ORDER — ASPIRIN EC 81 MG PO TBEC
81.0000 mg | DELAYED_RELEASE_TABLET | Freq: Every day | ORAL | Status: DC
  Administered 2019-06-14: 81 mg via ORAL
  Filled 2019-06-13 (×3): qty 1

## 2019-06-13 MED ORDER — ONDANSETRON HCL 4 MG/2ML IJ SOLN: 4.0000 mg | Freq: Four times a day (QID) | INTRAMUSCULAR | Status: DC | PRN

## 2019-06-13 MED ORDER — SODIUM CHLORIDE 0.9% FLUSH: 3.0000 mL | INTRAVENOUS | Status: DC | PRN

## 2019-06-13 MED ORDER — PSYLLIUM 95 % PO PACK
1.0000 | PACK | Freq: Two times a day (BID) | ORAL | Status: DC
  Filled 2019-06-13 (×2): qty 1

## 2019-06-13 MED ORDER — BISOPROLOL FUMARATE 5 MG PO TABS
5.0000 mg | ORAL_TABLET | ORAL | Status: DC
  Administered 2019-06-13: 5 mg via ORAL
  Filled 2019-06-13 (×2): qty 1

## 2019-06-13 MED ORDER — PANTOPRAZOLE SODIUM 40 MG PO TBEC
40.0000 mg | DELAYED_RELEASE_TABLET | Freq: Every day | ORAL | Status: DC
  Administered 2019-06-14: 40 mg via ORAL
  Filled 2019-06-13: qty 1

## 2019-06-13 MED ORDER — B COMPLEX-C PO TABS
1.0000 | ORAL_TABLET | Freq: Every day | ORAL | Status: DC
  Administered 2019-06-14: 1 via ORAL
  Filled 2019-06-13: qty 1

## 2019-06-13 MED ORDER — MIDODRINE HCL 5 MG PO TABS: 10.0000 mg | ORAL_TABLET | ORAL | Status: DC

## 2019-06-13 MED ORDER — SODIUM CHLORIDE 0.9 % IV SOLN: 250.0000 mL | INTRAVENOUS | Status: DC | PRN

## 2019-06-13 MED ORDER — APIXABAN 5 MG PO TABS
5.0000 mg | ORAL_TABLET | Freq: Two times a day (BID) | ORAL | Status: DC
  Administered 2019-06-13 – 2019-06-14 (×2): 5 mg via ORAL
  Filled 2019-06-13 (×3): qty 1

## 2019-06-13 MED ORDER — SODIUM CHLORIDE 0.9 % IV SOLN
10.0000 mL/h | Freq: Once | INTRAVENOUS | Status: AC
  Administered 2019-06-13: 10 mL/h via INTRAVENOUS

## 2019-06-13 MED ORDER — RENA-VITE PO TABS: 1.0000 | ORAL_TABLET | Freq: Every day | ORAL | Status: DC

## 2019-06-13 MED ORDER — AMIODARONE HCL 200 MG PO TABS
200.0000 mg | ORAL_TABLET | Freq: Every day | ORAL | Status: DC
  Administered 2019-06-14: 200 mg via ORAL
  Filled 2019-06-13: qty 1

## 2019-06-13 MED ORDER — TRAMADOL HCL 50 MG PO TABS: 50.0000 mg | ORAL_TABLET | Freq: Two times a day (BID) | ORAL | Status: DC | PRN

## 2019-06-13 MED ORDER — FERRIC CITRATE 1 GM 210 MG(FE) PO TABS
420.0000 mg | ORAL_TABLET | Freq: Three times a day (TID) | ORAL | Status: DC
  Administered 2019-06-14: 420 mg via ORAL
  Filled 2019-06-13 (×3): qty 2

## 2019-06-13 MED ORDER — SODIUM CHLORIDE 0.9% FLUSH
3.0000 mL | Freq: Two times a day (BID) | INTRAVENOUS | Status: DC
  Administered 2019-06-13: 3 mL via INTRAVENOUS

## 2019-06-13 MED ORDER — ACETAMINOPHEN 650 MG RE SUPP: 650.0000 mg | Freq: Four times a day (QID) | RECTAL | Status: DC | PRN

## 2019-06-13 MED ORDER — ACETAMINOPHEN 325 MG PO TABS: 650.0000 mg | ORAL_TABLET | Freq: Four times a day (QID) | ORAL | Status: DC | PRN

## 2019-06-13 NOTE — H&P (Signed)
History and Physical    Alex Williams Arp [id number] DOB: 16-Jun-1951 DOA: 06/13/2019  PCP: [name], MD  Patient coming from: Home  I have personally briefly reviewed patient's old medical records in [place]  Chief Complaint: Told he had anemia yesterday at dialysis and waited to come in until the morning.  HPI: Alex Williams is a 68 y.o. male with medical history significant of Coronary artery disease with recent bypass in September 2020 followed by requirement for hemodialysis.  Patient has had chronic anemia since starting hemodialysis and hard packed red blood cell transfusion while in the hospital for his bypass.  He was at hemodialysis on Friday noted to have a hemoglobin of 6.2 and was referred to the emergency department for blood transfusion.  He waited till today to come in.  Repeat hemoglobin in the emergency department today shows again that his hemoglobin is 6.2. Patient denies any acute shortness of breath or lower extremity edema.  He feels somewhat weak but has felt that way for quite some time.  He is sitting up in the stretcher with his feet hanging down.  He has no other complaints   Review of Systems: As per HPI otherwise all other systems reviewed and  negative.   Past Medical History:  Diagnosis Date  . Cervical disc disease   . Chronic kidney disease   . COLONIC POLYPS, HX OF 06/27/2007  . EXOGENOUS OBESITY 01/30/2010  . GERD (gastroesophageal reflux disease)    PMH  . GOUT 01/30/2010  . HYPERCHOLESTEROLEMIA 06/30/2007  . HYPERTENSION 06/27/2007  . LOW BACK PAIN 06/27/2007  . NEPHROLITHIASIS, HX OF 06/27/2007  . OSTEOARTHRITIS 06/27/2007  . SLEEP APNEA, OBSTRUCTIVE, MODERATE 01/27/2009  . Wears dentures    upper    Past Surgical History:  Procedure Laterality Date  . AV FISTULA PLACEMENT Right 01/19/2019   Procedure: RIGHT BRACHIOCEPHALIC ARTERIOVENOUS (AV) FISTULA CREATION;  Surgeon: Angelia Mould, MD;  Location: [place];  Service:  Vascular;  Laterality: Right;  . CARDIAC CATHETERIZATION    . CARPAL TUNNEL RELEASE     left hand  . COLONOSCOPY     with polypectomy  . CORONARY ARTERY BYPASS GRAFT N/A 04/16/2019   Procedure: CORONARY ARTERY BYPASS GRAFTING (CABG)X3  , WITH ENDOSCOPIC HARVESTING OF RIGHT GREATER SAPHENOUS VEIN;  Surgeon: Lajuana Matte, MD;  Location: [place];  Service: Open Heart Surgery;  Laterality: N/A;  . CORONARY/GRAFT ACUTE MI REVASCULARIZATION N/A 04/16/2019   Procedure: CORONARY/GRAFT ACUTE MI REVASCULARIZATION;  Surgeon: Burnell Blanks, MD;  Location: [place] [facility];  Service: Cardiovascular;  Laterality: N/A;  . INSERTION OF DIALYSIS CATHETER N/A 04/29/2019   Procedure: INSERTION OF TUNNELED DIALYSIS CATHETER, right internal jugular;  Surgeon: Rosetta Posner, MD;  Location: [facility];  Service: Vascular;  Laterality: N/A;  . JOINT REPLACEMENT Left 2005   knee  . LUMBAR LAMINECTOMY    . MULTIPLE TOOTH EXTRACTIONS    . RADIOFREQUENCY ABLATION KIDNEY    . RIGHT/LEFT HEART CATH AND CORONARY ANGIOGRAPHY N/A 04/16/2019   Procedure: RIGHT/LEFT HEART CATH AND CORONARY ANGIOGRAPHY;  Surgeon: Burnell Blanks, MD;  Location: [place] [facility];  Service: Cardiovascular;  Laterality: N/A;  . TEE WITHOUT CARDIOVERSION N/A 04/16/2019   Procedure: TRANSESOPHAGEAL ECHOCARDIOGRAM (TEE);  Surgeon: Lajuana Matte, MD;  Location: [place];  Service: Open Heart Surgery;  Laterality: N/A;  . TOTAL KNEE ARTHROPLASTY     left  . VENTRICULAR ASSIST DEVICE INSERTION N/A 04/16/2019   Procedure:  VENTRICULAR ASSIST DEVICE INSERTION;  Surgeon: Burnell Blanks, MD;  Location: [place] [facility];  Service: Cardiovascular;  Laterality: N/A;    Social History   Social History Narrative   Pt lives alone. He has one son who lives within 3 miles who he usually sees daily. He has two sisters living in the next town over who he also sees on a weekly basis. He is a religious man who attends church at  least 3 Sundays each month.   He operates [employer] full time.      reports that he quit smoking about 38 years ago. He has never used smokeless tobacco. He reports current alcohol use of about 1.0 standard drinks of alcohol per week. He reports that he does not use drugs.  Allergies  Allergen Reactions  . Codeine Phosphate Other (See Comments)    Hyperactive     Family History  Problem Relation Age of Onset  . Hypertension Other   . Diabetes Sister   . Hyperlipidemia Sister   . Sleep apnea Sister      Prior to Admission medications   Medication Sig Start Date End Date Taking? Authorizing Provider  allopurinol (ZYLOPRIM) 100 MG tablet Take 1 tablet (100 mg total) by mouth daily. 05/08/19  Yes Angiulli, Lavon Paganini, PA-C  amiodarone (PACERONE) 200 MG tablet Take 1 tablet (200 mg total) by mouth daily. 06/11/19  Yes Larey Dresser, MD  apixaban (ELIQUIS) 5 MG TABS tablet Take 1 tablet (5 mg total) by mouth 2 (two) times daily. 06/11/19  Yes Larey Dresser, MD  aspirin EC 81 MG EC tablet Take 1 tablet (81 mg total) by mouth daily. 05/01/19  Yes Conte, Tessa N, PA-C  bisoprolol (ZEBETA) 5 MG tablet Take 1 tablet (5 mg total) by mouth as directed. On NON Dialysis days (T,Th,Sat,Sun) 06/11/19  Yes Larey Dresser, MD  diclofenac sodium (VOLTAREN) 1 % GEL Apply 2 g topically 4 (four) times daily. Patient taking differently: Apply 2 g topically as needed (Right knee).  05/07/19  Yes Angiulli, Lavon Paganini, PA-C  ferric citrate (AURYXIA) 1 GM 210 MG(Fe) tablet Take 420 mg by mouth 3 (three) times daily with meals.    Yes [provider]  gabapentin (NEURONTIN) 100 MG capsule Take 100mg  by mouth at bedtime on HD days (M/W/F) 06/04/19  Yes [name], MD  midodrine (PROAMATINE) 10 MG tablet Take 1 tablet (10 mg total) by mouth every Monday, Wednesday, and Friday with hemodialysis. 05/08/19  Yes Angiulli, Lavon Paganini, PA-C  midodrine (PROAMATINE) 5 MG tablet Take 5 mg by mouth as  directed. On NON Dialysis days (T,Th,Sat,Sun)   Yes [provider]  multivitamin (RENA-VIT) TABS tablet Take 1 tablet by mouth at bedtime. 05/07/19  Yes Angiulli, Lavon Paganini, PA-C  pantoprazole (PROTONIX) 40 MG tablet Take 1 tablet (40 mg total) by mouth daily. 05/07/19  Yes Angiulli, Lavon Paganini, PA-C  pregabalin (LYRICA) 50 MG capsule Take 1 capsule (50 mg total) by mouth 2 (two) times daily. 05/07/19  Yes Angiulli, Lavon Paganini, PA-C  rosuvastatin (CRESTOR) 10 MG tablet Take 1 tablet (10 mg total) by mouth daily at 6 PM. 06/11/19  Yes Larey Dresser, MD  traMADol (ULTRAM) 50 MG tablet Take 1 tablet (50 mg total) by mouth every 12 (twelve) hours as needed for moderate pain. 05/07/19  Yes Angiulli, Lavon Paganini, PA-C  B Complex-C (B-COMPLEX WITH VITAMIN C) tablet Take 1 tablet by mouth daily. 05/01/19   Nicholes Rough  N, PA-C  cinacalcet (SENSIPAR) 30 MG tablet Take 1 tablet (30 mg total) by mouth daily with supper. Patient not taking: Reported on 06/11/2019 05/22/19   Lajuana Matte, MD  fluticasone (FLONASE) 50 MCG/ACT nasal spray Place 2 sprays into both nostrils daily. Patient taking differently: Place 2 sprays into both nostrils daily as needed for allergies or rhinitis.  09/16/18   [name], MD  psyllium (HYDROCIL/METAMUCIL) 95 % PACK Take 1 packet by mouth 2 (two) times daily. 05/07/19   Cathlyn Parsons, PA-C    Physical Exam:  Constitutional: NAD, calm, comfortable Vitals:   06/13/19 1545 06/13/19 1600 06/13/19 1630 06/13/19 1717  BP: 97/70 97/60 103/66 100/60  Pulse: 66 65 66 67  Resp:    18  Temp:    (!) 97.4 F (36.3 C)  TempSrc:    Oral  SpO2: 93% 98% 98% 100%   Eyes: PERRL, lids and conjunctivae normal ENMT: Mucous membranes are moist. Posterior pharynx clear of any exudate or lesions.Normal dentition.  Neck: normal, supple, no masses, no thyromegaly Respiratory: clear to auscultation bilaterally, no wheezing, no crackles. Normal respiratory effort. No  accessory muscle use.  Cardiovascular: Regular rate and rhythm, no murmurs / rubs / gallops.  Chronic brawny extremity edema. 2+ pedal pulses. No carotid bruits.  Abdomen: no tenderness, no masses palpated. No hepatosplenomegaly. Bowel sounds positive.  Musculoskeletal: no clubbing / cyanosis. No joint deformity upper and lower extremities. Good ROM, no contractures. Normal muscle tone.  Skin: no rashes, lesions, ulcers. No induration Neurologic: CN 2-12 grossly intact. Sensation intact, DTR normal. Strength 5/5 in all 4.  Psychiatric: Normal judgment and insight. Alert and oriented x 3. Normal mood.    Labs on Admission: I have personally reviewed following labs and imaging studies  CBC: Recent Labs  Lab 06/11/19 1157 06/13/19 1222  WBC 8.0 7.9  HGB 6.2* 6.2*  HCT 22.2* 21.6*  MCV 100.0 98.2  PLT 248 [id number]   Basic Metabolic Panel: Recent Labs  Lab 06/11/19 1157 06/13/19 1222  NA 140 141  K 4.2 3.6  CL 98 97*  CO2 27 31  GLUCOSE 103* 120*  BUN 31* 28*  CREATININE 5.38* 4.87*  CALCIUM 10.0 10.2   GFR: Estimated Creatinine Clearance: 19.6 mL/min (A) (by C-G formula based on SCr of 4.87 mg/dL (H)). Liver Function Tests: Recent Labs  Lab 06/11/19 1157 06/13/19 1222  AST 18 15  ALT 17 15  ALKPHOS 89 87  BILITOT 0.8 0.3  PROT 5.3* 5.3*  ALBUMIN 2.6* 2.6*   Lipid Profile: Recent Labs    06/11/19 1157  CHOL 111  HDL 31*  LDLCALC 60  TRIG 99  CHOLHDL 3.6   Thyroid Function Tests: Recent Labs    06/11/19 1157  TSH 2.341   Urine analysis:    Component Value Date/Time   COLORURINE DK. YELLOW 01/20/2009 0918   APPEARANCEUR Clear 08/09/2017 1116   LABSPEC >=1.030 01/20/2009 0918   PHURINE 5.0 01/20/2009 0918   GLUCOSEU Negative 08/09/2017 [place] 01/20/2009 0918   BILIRUBINUR Negative 08/09/2017 1116   KETONESUR negative 12/01/2016 0822   KETONESUR TRACE 01/20/2009 0918   PROTEINUR 3+ (A) 08/09/2017 1116   UROBILINOGEN 0.2 12/01/2016  0822   UROBILINOGEN 0.2 01/20/2009 0918   NITRITE Negative 08/09/2017 1116   NITRITE NEGATIVE 01/20/2009 0918   LEUKOCYTESUR Negative 08/09/2017 1116    Radiological Exams on Admission: No results found.  EKG: Independently reviewed by me personally Normal sinus rhythm Anterior infarct ,  age undetermined Low voltage QRS Abnormal ECG No significant change since last tracing  Assessment/Plan Principal Problem:   Anemia due to end stage renal disease [facility])   Patient presents due to anemia requires 2 units of packed red blood cells transfusion.  Anemia due to end-stage renal disease.  Packed red blood cells ordered by ED.  Will transfuse patient.  We will recheck hemoglobin in a.m.  If stable likely discharge home.  I have notified Dr. Hollie Salk from nephrology as patient is due to have dialysis tomorrow giving upcoming Thanksgiving holiday.  DVT prophylaxis: Full anticoagulation Code Status: Full code Family Communication: Patient's daughter present at the time of admission Disposition Plan: Home after blood transfusion and dialysis in a.m. Consults called: Nephrology Admission status: It is my clinical opinion that referral for OBSERVATION is reasonable and necessary in this patient based on the above information provided. The aforementioned taken together are felt to place the patient at high risk for further clinical deterioration. However it is anticipated that the patient may be medically stable for discharge from the hospital within 24 to 48 hours.    Lady Deutscher MD FACP [facility] Pager [phone]  How to contact the [facility] Attending or Consulting provider [place] or covering provider during after hours [place], for this patient?  1. Check the care team in [facility] and look for a) attending/consulting TR[facility] provider listed and b) the [facility] team listed 2. Log into [email or web address] and use [place]'s universal password to access. If you do not have the password, please contact  the hospital operator. 3. Locate the [facility] provider you are looking for under [facility] and page to a number that you can be directly reached. 4. If you still have difficulty reaching the provider, please page the [facility] (Director on Call) for the Hospitalists listed on amion for assistance.  If 7PM-7AM, please contact night-coverage [email or web address] Password [facility]  06/13/2019, 5:57 PM

## 2019-06-13 NOTE — ED Provider Notes (Signed)
[facility] EMERGENCY DEPARTMENT Provider Note   CSN: [id number] Arrival date & time: 06/13/19  1202     History   Chief Complaint Chief Complaint  Patient presents with  . Low Hgb    HPI Alex Williams is a 68 y.o. male.     Patient has a history of dialysis and had his dialysis yesterday.  He was told that his hemoglobin was low at 6.2 and he should go to the hospital to get blood.  He decided to come today instead no complaint  The history is provided by the patient and a significant other.  Weakness Severity:  Mild Onset quality:  Gradual Timing:  Constant Progression:  Unchanged Chronicity:  New Context: not alcohol use   Associated symptoms: no abdominal pain, no chest pain, no cough, no diarrhea, no frequency, no headaches and no seizures     Past Medical History:  Diagnosis Date  . Cervical disc disease   . Chronic kidney disease   . COLONIC POLYPS, HX OF 06/27/2007  . EXOGENOUS OBESITY 01/30/2010  . GERD (gastroesophageal reflux disease)    PMH  . GOUT 01/30/2010  . HYPERCHOLESTEROLEMIA 06/30/2007  . HYPERTENSION 06/27/2007  . LOW BACK PAIN 06/27/2007  . NEPHROLITHIASIS, HX OF 06/27/2007  . OSTEOARTHRITIS 06/27/2007  . SLEEP APNEA, OBSTRUCTIVE, MODERATE 01/27/2009  . Wears dentures    upper    Patient Active Problem List   Diagnosis Date Noted  . Recurrent knee pain 05/01/2019  . 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 Debility 04/30/2019  . ESRD on dialysis ([place])   . Hemodialysis-associated hypotension   . Atrial fibrillation with rapid ventricular response ([place])   . Acute on chronic anemia   . 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 Coronary artery disease 04/16/2019  . Acute ST elevation myocardial infarction (STEMI) involving left anterior descending (LAD) coronary artery ([place])   . Cardiogenic shock ([place])   . Acute ST elevation myocardial infarction (STEMI) of anterior wall ([place])   . Acute on chronic renal failure  ([place]) 08/26/2018  . Nonspecific abnormal electrocardiogram (ECG) (EKG) 08/25/2018  . Anemia in CKD (chronic kidney disease) 08/25/2018  . Elevated troponin 08/25/2018  . SOB (shortness of breath) 08/25/2018  . Acute pulmonary edema (HCC)   . Acute renal failure superimposed on stage 5 chronic kidney disease, not on chronic dialysis ([place]) 02/13/2018  . Morbid obesity with BMI of 45.0-49.9, adult ([place]) 12/03/2013  . Renal mass 12/02/2013  . Lower extremity edema 03/16/2013  . CKD (chronic kidney disease), stage III 12/10/2011  . GOUT 01/30/2010  . Obstructive sleep apnea 01/27/2009  . HYPERCHOLESTEROLEMIA 06/30/2007  . Essential hypertension 06/27/2007  . Osteoarthritis 06/27/2007  . LOW BACK PAIN 06/27/2007  . History of colonic polyps 06/27/2007  . NEPHROLITHIASIS, HX OF 06/27/2007    Past Surgical History:  Procedure Laterality Date  . AV FISTULA PLACEMENT Right 01/19/2019   Procedure: RIGHT BRACHIOCEPHALIC ARTERIOVENOUS (AV) FISTULA CREATION;  Surgeon: Angelia Mould, MD;  Location: [place];  Service: Vascular;  Laterality: Right;  . CARDIAC CATHETERIZATION    . CARPAL TUNNEL RELEASE     left hand  . COLONOSCOPY     with polypectomy  . CORONARY ARTERY BYPASS GRAFT N/A 04/16/2019   Procedure: CORONARY ARTERY BYPASS GRAFTING (CABG)X3  , WITH ENDOSCOPIC HARVESTING OF RIGHT GREATER SAPHENOUS VEIN;  Surgeon: Lajuana Matte, MD;  Location: [place];  Service: Open Heart Surgery;  Laterality: N/A;  . CORONARY/GRAFT  ACUTE MI REVASCULARIZATION N/A 04/16/2019   Procedure: CORONARY/GRAFT ACUTE MI REVASCULARIZATION;  Surgeon: Burnell Blanks, MD;  Location: [place] [facility];  Service: Cardiovascular;  Laterality: N/A;  . INSERTION OF DIALYSIS CATHETER N/A 04/29/2019   Procedure: INSERTION OF TUNNELED DIALYSIS CATHETER, right internal jugular;  Surgeon: Rosetta Posner, MD;  Location: [place];  Service: Vascular;  Laterality: N/A;  . JOINT REPLACEMENT Left 2005   knee  . LUMBAR  LAMINECTOMY    . MULTIPLE TOOTH EXTRACTIONS    . RADIOFREQUENCY ABLATION KIDNEY    . RIGHT/LEFT HEART CATH AND CORONARY ANGIOGRAPHY N/A 04/16/2019   Procedure: RIGHT/LEFT HEART CATH AND CORONARY ANGIOGRAPHY;  Surgeon: Burnell Blanks, MD;  Location: [place] [facility];  Service: Cardiovascular;  Laterality: N/A;  . TEE WITHOUT CARDIOVERSION N/A 04/16/2019   Procedure: TRANSESOPHAGEAL ECHOCARDIOGRAM (TEE);  Surgeon: Lajuana Matte, MD;  Location: [place];  Service: Open Heart Surgery;  Laterality: N/A;  . TOTAL KNEE ARTHROPLASTY     left  . VENTRICULAR ASSIST DEVICE INSERTION N/A 04/16/2019   Procedure: VENTRICULAR ASSIST DEVICE INSERTION;  Surgeon: Burnell Blanks, MD;  Location: [place] [facility];  Service: Cardiovascular;  Laterality: N/A;        Home Medications    Prior to Admission medications   Medication Sig Start Date End Date Taking? Authorizing Provider  allopurinol (ZYLOPRIM) 100 MG tablet Take 1 tablet (100 mg total) by mouth daily. 05/08/19  Yes Angiulli, Lavon Paganini, PA-C  amiodarone (PACERONE) 200 MG tablet Take 1 tablet (200 mg total) by mouth daily. 06/11/19  Yes Larey Dresser, MD  apixaban (ELIQUIS) 5 MG TABS tablet Take 1 tablet (5 mg total) by mouth 2 (two) times daily. 06/11/19  Yes Larey Dresser, MD  aspirin EC 81 MG EC tablet Take 1 tablet (81 mg total) by mouth daily. 05/01/19  Yes Conte, Tessa N, PA-C  bisoprolol (ZEBETA) 5 MG tablet Take 1 tablet (5 mg total) by mouth as directed. On NON Dialysis days (T,Th,Sat,Sun) 06/11/19  Yes Larey Dresser, MD  diclofenac sodium (VOLTAREN) 1 % GEL Apply 2 g topically 4 (four) times daily. Patient taking differently: Apply 2 g topically as needed (Right knee).  05/07/19  Yes Angiulli, Lavon Paganini, PA-C  ferric citrate (AURYXIA) 1 GM 210 MG(Fe) tablet Take 420 mg by mouth 3 (three) times daily with meals.    Yes [provider]  gabapentin (NEURONTIN) 100 MG capsule Take 100mg  by mouth at bedtime on  HD days (M/W/F) 06/04/19  Yes [name], MD  midodrine (PROAMATINE) 10 MG tablet Take 1 tablet (10 mg total) by mouth every Monday, Wednesday, and Friday with hemodialysis. 05/08/19  Yes Angiulli, Lavon Paganini, PA-C  midodrine (PROAMATINE) 5 MG tablet Take 5 mg by mouth as directed. On NON Dialysis days (T,Th,Sat,Sun)   Yes [provider]  multivitamin (RENA-VIT) TABS tablet Take 1 tablet by mouth at bedtime. 05/07/19  Yes Angiulli, Lavon Paganini, PA-C  pantoprazole (PROTONIX) 40 MG tablet Take 1 tablet (40 mg total) by mouth daily. 05/07/19  Yes Angiulli, Lavon Paganini, PA-C  pregabalin (LYRICA) 50 MG capsule Take 1 capsule (50 mg total) by mouth 2 (two) times daily. 05/07/19  Yes Angiulli, Lavon Paganini, PA-C  rosuvastatin (CRESTOR) 10 MG tablet Take 1 tablet (10 mg total) by mouth daily at 6 PM. 06/11/19  Yes Larey Dresser, MD  traMADol (ULTRAM) 50 MG tablet Take 1 tablet (50 mg total) by mouth every 12 (twelve) hours  as needed for moderate pain. 05/07/19  Yes Angiulli, Lavon Paganini, PA-C  B Complex-C (B-COMPLEX WITH VITAMIN C) tablet Take 1 tablet by mouth daily. 05/01/19   Elgie Collard, PA-C  cinacalcet (SENSIPAR) 30 MG tablet Take 1 tablet (30 mg total) by mouth daily with supper. Patient not taking: Reported on 06/11/2019 05/22/19   Lajuana Matte, MD  fluticasone (FLONASE) 50 MCG/ACT nasal spray Place 2 sprays into both nostrils daily. Patient taking differently: Place 2 sprays into both nostrils daily as needed for allergies or rhinitis.  09/16/18   [name], MD  psyllium (HYDROCIL/METAMUCIL) 95 % PACK Take 1 packet by mouth 2 (two) times daily. 05/07/19   Angiulli, Lavon Paganini, PA-C    Family History Family History  Problem Relation Age of Onset  . Hypertension Other   . Diabetes Sister   . Hyperlipidemia Sister   . Sleep apnea Sister     Social History Social History   Tobacco Use  . Smoking status: Former Smoker    Quit date: 07/23/1980    Years since quitting: 38.9   . Smokeless tobacco: Never Used  Substance Use Topics  . Alcohol use: Yes    Alcohol/week: 1.0 standard drinks    Types: 1 Cans of beer per week    Comment: rare  . Drug use: No     Allergies   Codeine phosphate   Review of Systems Review of Systems  Constitutional: Negative for appetite change and fatigue.  HENT: Negative for congestion, ear discharge and sinus pressure.   Eyes: Negative for discharge.  Respiratory: Negative for cough.   Cardiovascular: Negative for chest pain.  Gastrointestinal: Negative for abdominal pain and diarrhea.  Genitourinary: Negative for frequency and hematuria.  Musculoskeletal: Negative for back pain.  Skin: Negative for rash.  Neurological: Positive for weakness. Negative for seizures and headaches.  Psychiatric/Behavioral: Negative for hallucinations.     Physical Exam Updated Vital Signs BP (!) 90/43   Pulse 67   Temp 98.7 F (37.1 C) (Oral)   Resp 18   SpO2 100%   Physical Exam Vitals signs and nursing note reviewed.  Constitutional:      Appearance: He is well-developed.  HENT:     Head: Normocephalic.     Nose: Nose normal.  Eyes:     General: No scleral icterus.    Conjunctiva/sclera: Conjunctivae normal.  Neck:     Musculoskeletal: Neck supple.     Thyroid: No thyromegaly.  Cardiovascular:     Rate and Rhythm: Normal rate and regular rhythm.     Heart sounds: No murmur. No friction rub. No gallop.   Pulmonary:     Breath sounds: No stridor. No wheezing or rales.  Chest:     Chest wall: No tenderness.  Abdominal:     General: There is no distension.     Tenderness: There is no abdominal tenderness. There is no rebound.  Musculoskeletal: Normal range of motion.  Lymphadenopathy:     Cervical: No cervical adenopathy.  Skin:    Findings: No erythema or rash.  Neurological:     Mental Status: He is oriented to person, place, and time.     Motor: No abnormal muscle tone.     Coordination: Coordination normal.   Psychiatric:        Behavior: Behavior normal.      ED Treatments / Results  Labs (all labs ordered are listed, but only abnormal results are displayed) Labs Reviewed  COMPREHENSIVE METABOLIC PANEL -  Abnormal; Notable for the following components:      Result Value   Chloride 97 (*)    Glucose, Bld 120 (*)    BUN 28 (*)    Creatinine, Ser 4.87 (*)    Total Protein 5.3 (*)    Albumin 2.6 (*)    GFR calc non Af Amer 11 (*)    GFR calc Af Amer 13 (*)    All other components within normal limits  CBC - Abnormal; Notable for the following components:   RBC 2.20 (*)    Hemoglobin 6.2 (*)    HCT 21.6 (*)    MCHC 28.7 (*)    RDW 19.1 (*)    All other components within normal limits  POC SARS CORONAVIRUS 2 AG -  ED  TYPE AND SCREEN  PREPARE RBC (CROSSMATCH)    EKG None  Radiology No results found.  Procedures Procedures (including critical care time)  Medications Ordered in ED Medications  0.9 %  sodium chloride infusion (has no administration in time range)  sodium chloride 0.9 % bolus 500 mL (has no administration in time range)     Initial Impression / Assessment and Plan / ED Course  I have reviewed the triage vital signs and the nursing notes.  Pertinent labs & imaging results that were available during my care of the patient were reviewed by me and considered in my medical decision making (see chart for details).    CRITICAL CARE Performed by: Milton Ferguson Total critical care time: 45 minutes Critical care time was exclusive of separately billable procedures and treating other patients. Critical care was necessary to treat or prevent imminent or life-threatening deterioration. Critical care was time spent personally by me on the following activities: development of treatment plan with patient and/or surrogate as well as nursing, discussions with consultants, evaluation of patient's response to treatment, examination of patient, obtaining history from patient  or surrogate, ordering and performing treatments and interventions, ordering and review of laboratory studies, ordering and review of radiographic studies, pulse oximetry and re-evaluation of patient's condition.      Patient is anemic at 6.2.  I spoke with nephrology and they wanted the patient to get transfused 2 units of blood.  He will be admitted to medicine and get transfused  Final Clinical Impressions(s) / ED Diagnoses   Final diagnoses:  None    ED Discharge Orders    None       Milton Ferguson, MD 06/13/19 1504

## 2019-06-13 NOTE — ED Triage Notes (Signed)
Pt sent from dialysis due to Hgb 6.3.  Denies pain.  C/o generalized weakness that started this morning.  Denies SOB.

## 2019-06-14 DIAGNOSIS — N186 End stage renal disease: Secondary | ICD-10-CM

## 2019-06-14 DIAGNOSIS — D631 Anemia in chronic kidney disease: Secondary | ICD-10-CM | POA: Diagnosis not present

## 2019-06-14 LAB — TYPE AND SCREEN
ABO/RH(D): O NEG
Antibody Screen: NEGATIVE
Unit division: 0
Unit division: 0

## 2019-06-14 LAB — BPAM RBC
Blood Product Expiration Date: 202012032359
Blood Product Expiration Date: 202012032359
ISSUE DATE / TIME: 202011211501
ISSUE DATE / TIME: 202011211935
Unit Type and Rh: 9500
Unit Type and Rh: 9500

## 2019-06-14 LAB — CBC
HCT: 26 % — ABNORMAL LOW (ref 39.0–52.0)
Hemoglobin: 7.7 g/dL — ABNORMAL LOW (ref 13.0–17.0)
MCH: 27.8 pg (ref 26.0–34.0)
MCHC: 29.6 g/dL — ABNORMAL LOW (ref 30.0–36.0)
MCV: 93.9 fL (ref 80.0–100.0)
Platelets: 260 10*3/uL (ref 150–400)
RBC: 2.77 MIL/uL — ABNORMAL LOW (ref 4.22–5.81)
RDW: 19 % — ABNORMAL HIGH (ref 11.5–15.5)
WBC: 10.2 10*3/uL (ref 4.0–10.5)
nRBC: 0 % (ref 0.0–0.2)

## 2019-06-14 MED ORDER — CHLORHEXIDINE GLUCONATE CLOTH 2 % EX PADS: 6.0000 | MEDICATED_PAD | Freq: Every day | CUTANEOUS | Status: DC

## 2019-06-14 NOTE — Discharge Summary (Signed)
Physician Discharge Summary  Alex Williams [id number] DOB: Nov 25, 1950 DOA: 06/13/2019  PCP: [name], MD  Admit date: 06/13/2019 Discharge date: 06/14/2019  Admitted From: Home Disposition:  Home  Recommendations for Outpatient Follow-up:  1. Follow up with PCP in 1-2 weeks 2. Follow up with scheduled dialysis  Discharge Condition:Stable CODE STATUS:Full Diet recommendation: Renal   Brief/Interim Summary: 68 y.o. male with medical history significant of Coronary artery disease with recent bypass in September 2020 followed by requirement for hemodialysis.  Patient has had chronic anemia since starting hemodialysis and hard packed red blood cell transfusion while in the hospital for his bypass.  He was at hemodialysis on Friday noted to have a hemoglobin of 6.2 and was referred to the emergency department for blood transfusion.  He waited till today to come in.  Repeat hemoglobin in the emergency department today shows again that his hemoglobin is 6.2. Patient denies any acute shortness of breath or lower extremity edema.  He feels somewhat weak but has felt that way for quite some time.  He is sitting up in the stretcher with his feet hanging down.  He has no other complaints  Discharge Diagnoses:  Principal Problem:   Anemia due to end stage renal disease (HCC)  Chronic anemia secondary to renal disease -Placed on observation overnight -Pt did receive 2 units PRBC's with repeat hgb of 7.7 from 6.2 -No evidence of acute bleeding -Remained hemodynamically stable  ESRD -Nephrology was consulted -Plan to have pt follow up with outpatient HD today  HTN -BP remained stable  Gout -Remained stable this visit  HLD -Currently stable  Discharge Instructions   Allergies as of 06/14/2019      Reactions   Codeine Phosphate Other (See Comments)   Hyperactive       Medication List    TAKE these medications   allopurinol 100 MG tablet Commonly known as:  ZYLOPRIM Take 1 tablet (100 mg total) by mouth daily.   amiodarone 200 MG tablet Commonly known as: PACERONE Take 1 tablet (200 mg total) by mouth daily.   apixaban 5 MG Tabs tablet Commonly known as: ELIQUIS Take 1 tablet (5 mg total) by mouth 2 (two) times daily.   aspirin 81 MG EC tablet Take 1 tablet (81 mg total) by mouth daily.   B-complex with vitamin C tablet Take 1 tablet by mouth daily.   bisoprolol 5 MG tablet Commonly known as: ZEBETA Take 1 tablet (5 mg total) by mouth as directed. On NON Dialysis days (T,Th,Sat,Sun)   cinacalcet 30 MG tablet Commonly known as: SENSIPAR Take 1 tablet (30 mg total) by mouth daily with supper.   diclofenac sodium 1 % Gel Commonly known as: VOLTAREN Apply 2 g topically 4 (four) times daily. What changed:   when to take this  reasons to take this   ferric citrate 1 GM 210 MG(Fe) tablet Commonly known as: AURYXIA Take 420 mg by mouth 3 (three) times daily with meals.   fluticasone 50 MCG/ACT nasal spray Commonly known as: FLONASE Place 2 sprays into both nostrils daily. What changed:   when to take this  reasons to take this   gabapentin 100 MG capsule Commonly known as: NEURONTIN Take 100mg  by mouth at bedtime on HD days (M/W/F)   midodrine 5 MG tablet Commonly known as: PROAMATINE Take 5 mg by mouth as directed. On NON Dialysis days (T,Th,Sat,Sun)   midodrine 10 MG tablet Commonly known as: PROAMATINE Take 1 tablet (10 mg total) by  mouth every Monday, Wednesday, and Friday with hemodialysis.   multivitamin Tabs tablet Take 1 tablet by mouth at bedtime.   pantoprazole 40 MG tablet Commonly known as: PROTONIX Take 1 tablet (40 mg total) by mouth daily.   pregabalin 50 MG capsule Commonly known as: LYRICA Take 1 capsule (50 mg total) by mouth 2 (two) times daily.   psyllium 95 % Pack Commonly known as: HYDROCIL/METAMUCIL Take 1 packet by mouth 2 (two) times daily.   rosuvastatin 10 MG tablet Commonly  known as: CRESTOR Take 1 tablet (10 mg total) by mouth daily at 6 PM.   traMADol 50 MG tablet Commonly known as: ULTRAM Take 1 tablet (50 mg total) by mouth every 12 (twelve) hours as needed for moderate pain.      Follow-up Information    [name], MD.   Specialty: [facility] Contact information: [address] [place] [address] [id number]          Allergies  Allergen Reactions  . Codeine Phosphate Other (See Comments)    Hyperactive     Consultations:  Nephrology  Procedures/Studies: Dg Chest 2 View  Result Date: 05/22/2019 CLINICAL DATA:  Post CABG EXAM: CHEST - 2 VIEW COMPARISON:  04/29/2019 FINDINGS: Grossly unchanged enlarged cardiac silhouette and mediastinal contours post median sternotomy and CABG. Atherosclerotic plaque within the thoracic aorta. Interval removal of left subclavian central venous catheter. Right jugular approach dialysis catheter tips terminate over the superior cavoatrial junction. Interval development of a small left-sided pleural effusion and associated worsening left basilar heterogeneous opacities. Right basilar heterogeneous opacities are unchanged. No definite right-sided pleural effusion. No evidence of edema. No pneumothorax. Stigmata of DISH within the thoracic spine. No acute osseous abnormalities. IMPRESSION: 1. Interval development of a small left-sided effusion with associated left basilar opacities, atelectasis versus infiltrate. 2. Similar findings of cardiomegaly without evidence of edema. Electronically Signed   By: Sandi Mariscal M.D.   On: 05/22/2019 08:56     Subjective: Without complaints  Discharge Exam: Vitals:   06/14/19 0615 06/14/19 0912  BP: 102/67 (!) 93/57  Pulse: 100 64  Resp: 20   Temp: 98.7 F (37.1 C)   SpO2: 90%    Vitals:   06/13/19 2058 06/13/19 2210 06/14/19 0615 06/14/19 0912  BP: 119/68 105/68 102/67 (!) 93/57  Pulse: 66 63 100 64  Resp: 16 20 20    Temp: 97.8 F (36.6 C) 98.1 F  (36.7 C) 98.7 F (37.1 C)   TempSrc: Oral Oral Oral   SpO2: 100% 98% 90%   Weight: 126.4 kg  126.1 kg   Height: 6' (1.829 m)       General: Pt is alert, awake, not in acute distress Cardiovascular: RRR, S1/S2 +, no rubs, no gallops Respiratory: CTA bilaterally, no wheezing, no rhonchi Abdominal: Soft, NT, ND, bowel sounds + Extremities: no edema, no cyanosis   The results of significant diagnostics from this hospitalization (including imaging, microbiology, ancillary and laboratory) are listed below for reference.     Microbiology: No results found for this or any previous visit (from the past 240 hour(s)).   Labs: BNP (last 3 results) Recent Labs    08/25/18 1746 01/30/19 0819 02/05/19 0951  BNP 513.0* 1,138.2* [id number]*   Basic Metabolic Panel: Recent Labs  Lab 06/11/19 1157 06/13/19 1222  NA 140 141  K 4.2 3.6  CL 98 97*  CO2 27 31  GLUCOSE 103* 120*  BUN 31* 28*  CREATININE 5.38* 4.87*  CALCIUM 10.0 10.2  Liver Function Tests: Recent Labs  Lab 06/11/19 1157 06/13/19 1222  AST 18 15  ALT 17 15  ALKPHOS 89 87  BILITOT 0.8 0.3  PROT 5.3* 5.3*  ALBUMIN 2.6* 2.6*   No results for input(s): LIPASE, AMYLASE in the last 168 hours. No results for input(s): AMMONIA in the last 168 hours. CBC: Recent Labs  Lab 06/11/19 1157 06/13/19 1222 06/13/19 1829 06/14/19 0409  WBC 8.0 7.9  --  10.2  HGB 6.2* 6.2* 6.5* 7.7*  HCT 22.2* 21.6* 21.9* 26.0*  MCV 100.0 98.2  --  93.9  PLT 248 234  --  260   Cardiac Enzymes: No results for input(s): CKTOTAL, CKMB, CKMBINDEX, TROPONINI in the last 168 hours. BNP: Invalid input(s): POCBNP CBG: No results for input(s): GLUCAP in the last 168 hours. D-Dimer No results for input(s): DDIMER in the last 72 hours. Hgb A1c No results for input(s): HGBA1C in the last 72 hours. Lipid Profile Recent Labs    06/11/19 1157  CHOL 111  HDL 31*  LDLCALC 60  TRIG 99  CHOLHDL 3.6   Thyroid function studies Recent Labs     06/11/19 1157  TSH 2.341   Anemia work up No results for input(s): VITAMINB12, FOLATE, FERRITIN, TIBC, IRON, RETICCTPCT in the last 72 hours. Urinalysis    Component Value Date/Time   COLORURINE DK. YELLOW 01/20/2009 0918   APPEARANCEUR Clear 08/09/2017 1116   LABSPEC >=1.030 01/20/2009 0918   PHURINE 5.0 01/20/2009 0918   GLUCOSEU Negative 08/09/2017 1116   GLUCOSEU NEGATIVE 01/20/2009 0918   BILIRUBINUR Negative 08/09/2017 1116   KETONESUR negative 12/01/2016 0822   KETONESUR TRACE 01/20/2009 0918   PROTEINUR 3+ (A) 08/09/2017 1116   UROBILINOGEN 0.2 12/01/2016 0822   UROBILINOGEN 0.2 01/20/2009 0918   NITRITE Negative 08/09/2017 1116   NITRITE NEGATIVE 01/20/2009 0918   LEUKOCYTESUR Negative 08/09/2017 1116   Sepsis Labs Invalid input(s): PROCALCITONIN,  WBC,  LACTICIDVEN Microbiology No results found for this or any previous visit (from the past 240 hour(s)).  Time spent: 30 min  SIGNED:   Marylu Lund, MD  [facility] 06/14/2019, 9:58 AM  If 7PM-7AM, please contact night-coverage

## 2019-06-15 ENCOUNTER — Telehealth: Payer: Self-pay | Admitting: Nephrology

## 2019-06-17 ENCOUNTER — Telehealth: Payer: Self-pay | Admitting: Family Medicine

## 2019-06-17 NOTE — Telephone Encounter (Signed)
homehealth called to get verbal orders for the pt .   Please advise Mallorie [phone]

## 2019-06-17 NOTE — Telephone Encounter (Signed)
Spoke with Mallorie, needed verbal orders confirmed for ot once a wk fo 3 wks.

## 2019-06-22 ENCOUNTER — Telehealth: Payer: Self-pay | Admitting: *Deleted

## 2019-06-22 NOTE — Telephone Encounter (Signed)
Written orders signed and faxed. Ok to give verbal.

## 2019-06-22 NOTE — Telephone Encounter (Signed)
Mallory is Mr. Newcomer HHN.  She called today stating that he is concerned with arm weakness since surgery, s/p CABG X 3 on 04/16/19.  He was to see Dr. Kipp Brood for a post op visit on 05/15/19, but Mr. Macbride canceled the appointment. I explained to the nurse that this often happens due to the bruising/stretching of nerves when the sternum was cut and stretched during surgery.  This usually resolves on its own.  He needs to make a follow up appointment with Dr. Kipp Brood.  She will relay this to him tomorrow when she sees him.  She also said there were no signs of stroke.

## 2019-06-23 ENCOUNTER — Other Ambulatory Visit (HOSPITAL_COMMUNITY): Payer: Self-pay

## 2019-06-23 ENCOUNTER — Other Ambulatory Visit: Payer: Self-pay

## 2019-06-23 MED ORDER — AMIODARONE HCL 200 MG PO TABS: 200.0000 mg | ORAL_TABLET | Freq: Every day | ORAL | 1 refills | Status: DC

## 2019-06-25 ENCOUNTER — Other Ambulatory Visit: Payer: Self-pay

## 2019-06-25 ENCOUNTER — Encounter (HOSPITAL_COMMUNITY)
Admission: RE | Disposition: A | Discharge status: Home / Self Care | Payer: Self-pay | Source: Home / Self Care | Attending: Vascular Surgery

## 2019-06-25 ENCOUNTER — Ambulatory Visit (HOSPITAL_COMMUNITY)
Admission: RE | Admit: 2019-06-25 | Discharge: 2019-06-25 | Disposition: A | Discharge status: Home / Self Care | Payer: Medicare Other | Attending: Vascular Surgery | Admitting: Vascular Surgery

## 2019-06-25 DIAGNOSIS — G4733 Obstructive sleep apnea (adult) (pediatric): Secondary | ICD-10-CM | POA: Insufficient documentation

## 2019-06-25 DIAGNOSIS — Y832 Surgical operation with anastomosis, bypass or graft as the cause of abnormal reaction of the patient, or of later complication, without mention of misadventure at the time of the procedure: Secondary | ICD-10-CM | POA: Diagnosis not present

## 2019-06-25 DIAGNOSIS — Z8249 Family history of ischemic heart disease and other diseases of the circulatory system: Secondary | ICD-10-CM | POA: Diagnosis not present

## 2019-06-25 DIAGNOSIS — Z951 Presence of aortocoronary bypass graft: Secondary | ICD-10-CM | POA: Diagnosis not present

## 2019-06-25 DIAGNOSIS — I120 Hypertensive chronic kidney disease with stage 5 chronic kidney disease or end stage renal disease: Secondary | ICD-10-CM | POA: Diagnosis not present

## 2019-06-25 DIAGNOSIS — Z87891 Personal history of nicotine dependence: Secondary | ICD-10-CM | POA: Insufficient documentation

## 2019-06-25 DIAGNOSIS — Z885 Allergy status to narcotic agent status: Secondary | ICD-10-CM | POA: Insufficient documentation

## 2019-06-25 DIAGNOSIS — T82858A Stenosis of vascular prosthetic devices, implants and grafts, initial encounter: Secondary | ICD-10-CM | POA: Insufficient documentation

## 2019-06-25 DIAGNOSIS — Z20828 Contact with and (suspected) exposure to other viral communicable diseases: Secondary | ICD-10-CM | POA: Diagnosis not present

## 2019-06-25 DIAGNOSIS — Z992 Dependence on renal dialysis: Secondary | ICD-10-CM | POA: Insufficient documentation

## 2019-06-25 DIAGNOSIS — M1990 Unspecified osteoarthritis, unspecified site: Secondary | ICD-10-CM | POA: Diagnosis not present

## 2019-06-25 DIAGNOSIS — N186 End stage renal disease: Secondary | ICD-10-CM | POA: Diagnosis not present

## 2019-06-25 DIAGNOSIS — K219 Gastro-esophageal reflux disease without esophagitis: Secondary | ICD-10-CM | POA: Diagnosis not present

## 2019-06-25 DIAGNOSIS — E785 Hyperlipidemia, unspecified: Secondary | ICD-10-CM | POA: Insufficient documentation

## 2019-06-25 DIAGNOSIS — T82898A Other specified complication of vascular prosthetic devices, implants and grafts, initial encounter: Secondary | ICD-10-CM | POA: Diagnosis not present

## 2019-06-25 HISTORY — PX: PERIPHERAL VASCULAR BALLOON ANGIOPLASTY: CATH118281

## 2019-06-25 LAB — POCT I-STAT, CHEM 8
BUN: 27 mg/dL — ABNORMAL HIGH (ref 8–23)
Calcium, Ion: 1.28 mmol/L (ref 1.15–1.40)
Chloride: 95 mmol/L — ABNORMAL LOW (ref 98–111)
Creatinine, Ser: 5.2 mg/dL — ABNORMAL HIGH (ref 0.61–1.24)
Glucose, Bld: 74 mg/dL (ref 70–99)
HCT: 24 % — ABNORMAL LOW (ref 39.0–52.0)
Hemoglobin: 8.2 g/dL — ABNORMAL LOW (ref 13.0–17.0)
Potassium: 3.4 mmol/L — ABNORMAL LOW (ref 3.5–5.1)
Sodium: 141 mmol/L (ref 135–145)
TCO2: 33 mmol/L — ABNORMAL HIGH (ref 22–32)

## 2019-06-25 LAB — SARS CORONAVIRUS 2 (TAT 6-24 HRS): SARS Coronavirus 2: NEGATIVE

## 2019-06-25 SURGERY — PERIPHERAL VASCULAR BALLOON ANGIOPLASTY
Anesthesia: LOCAL | Laterality: Right

## 2019-06-25 MED ORDER — SODIUM CHLORIDE 0.9% FLUSH: 3.0000 mL | INTRAVENOUS | Status: DC | PRN

## 2019-06-25 MED ORDER — HEPARIN (PORCINE) IN NACL 1000-0.9 UT/500ML-% IV SOLN
INTRAVENOUS | Status: DC | PRN
  Administered 2019-06-25: 500 mL

## 2019-06-25 MED ORDER — SODIUM CHLORIDE 0.9% FLUSH: 3.0000 mL | Freq: Two times a day (BID) | INTRAVENOUS | Status: DC

## 2019-06-25 MED ORDER — HEPARIN (PORCINE) IN NACL 1000-0.9 UT/500ML-% IV SOLN
INTRAVENOUS | Status: AC
  Filled 2019-06-25: qty 500

## 2019-06-25 MED ORDER — IODIXANOL 320 MG/ML IV SOLN
INTRAVENOUS | Status: DC | PRN
  Administered 2019-06-25: 50 mL via INTRAVENOUS

## 2019-06-25 MED ORDER — HEPARIN SODIUM (PORCINE) 1000 UNIT/ML IJ SOLN
INTRAMUSCULAR | Status: DC | PRN
  Administered 2019-06-25: 3000 [IU] via INTRAVENOUS

## 2019-06-25 MED ORDER — LIDOCAINE HCL (PF) 1 % IJ SOLN
INTRAMUSCULAR | Status: DC | PRN
  Administered 2019-06-25: 2 mL via INTRADERMAL

## 2019-06-25 MED ORDER — HEPARIN SODIUM (PORCINE) 1000 UNIT/ML IJ SOLN
INTRAMUSCULAR | Status: AC
  Filled 2019-06-25: qty 1

## 2019-06-25 MED ORDER — SODIUM CHLORIDE 0.9 % IV SOLN: 250.0000 mL | INTRAVENOUS | Status: DC | PRN

## 2019-06-25 SURGICAL SUPPLY — 16 items
BAG SNAP BAND KOVER 36X36 (MISCELLANEOUS) ×2 IMPLANT
BALLN MUSTANG 10X60X75 (BALLOONS) ×2
BALLN MUSTANG 12X60X75 (BALLOONS) ×2
BALLOON MUSTANG 10X60X75 (BALLOONS) IMPLANT
BALLOON MUSTANG 12X60X75 (BALLOONS) IMPLANT
COVER DOME SNAP 22 D (MISCELLANEOUS) ×2 IMPLANT
KIT ENCORE 26 ADVANTAGE (KITS) ×1 IMPLANT
KIT MICROPUNCTURE NIT STIFF (SHEATH) ×1 IMPLANT
PROTECTION STATION PRESSURIZED (MISCELLANEOUS) ×2
SHEATH PINNACLE R/O II 7F 4CM (SHEATH) ×1 IMPLANT
SHEATH PROBE COVER 6X72 (BAG) ×2 IMPLANT
STATION PROTECTION PRESSURIZED (MISCELLANEOUS) ×1 IMPLANT
STOPCOCK MORSE 400PSI 3WAY (MISCELLANEOUS) ×2 IMPLANT
TRAY PV CATH (CUSTOM PROCEDURE TRAY) ×2 IMPLANT
TUBING CIL FLEX 10 FLL-RA (TUBING) ×2 IMPLANT
WIRE BENTSON .035X145CM (WIRE) ×1 IMPLANT

## 2019-06-25 NOTE — Op Note (Signed)
    OPERATIVE NOTE   PROCEDURE: 1. right brachiocephalic arteriovenous fistula cannulation under ultrasound guidance 2. right arm fistulogram including central venogram 3. right central angioplasty (subclavian vein) 10 mm x 60 mm Mustang and 12 mm x 60 mm Mustang   PRE-OPERATIVE DIAGNOSIS: Malfunctioning right arteriovenous fistula  POST-OPERATIVE DIAGNOSIS: same as above   SURGEON: Marty Heck, MD  ANESTHESIA: local  ESTIMATED BLOOD LOSS: 5 cc  FINDING(S): 1. The fistula itself had an excellent thrill even before intervention.  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  The remainder of the upper arm cephalic vein including the arterial anastomosis was all widely patent.  The fistula emptied very briskly.  The right subclavian vein was angioplastied with a 10 mm and then 12 mm Mustang with less than 30% residual stenosis.  Excellent thrill in the fistula.  SPECIMEN(S):  None  CONTRAST: 50 cc  INDICATIONS: Alex Williams is a 68 y.o. male who  presents with malfunctioning right brachiocephalic arteriovenous fistula.  The patient is scheduled for right arm fistulogram.  The patient is aware the risks include but are not limited to: bleeding, infection, thrombosis of the cannulated access, and possible anaphylactic reaction to the contrast.  The patient is aware of the risks of the procedure and elects to proceed forward.  DESCRIPTION: After full informed written consent was obtained, the patient was brought back to the angiography suite and placed supine upon the angiography table.  The patient was connected to monitoring equipment.  The right arm was prepped and draped in the standard fashion for a right arm fistulogram.  Under ultrasound guidance, the right arm fistula was evaluated, it was patent, an image was saved.  The fistula was accessed with a micropuncture needle under ultrasound guidance.  The microwire was  advanced into the fistula and the needle was exchanged for the a microsheath, which was lodged 2 cm into the access.  The wire was removed and the sheath was connected to the IV extension tubing.  Hand injections were completed to image the access from the antecubitum up to the level of axilla.  The central venous structures were also imaged by hand injections.  Ultimately elected to intervene on the right subclavian stenosis.  Bentson wire was placed and the micro sheath was exchanged for a short 7 [place] sheath.  Patient was given 3000 units of IV heparin.  I then used a 10 mm x 60 mm Mustang to nominal pressure for 76minutes to treat the right subclavian vein lesion.  Another hand-injection through the sheath showed some residual stenosis.  I then used a 12 mm x 60 mm Mustang for 2 minutes.  There was some rebound of the lesion but with only about 30% remaining stenosis and an excellent thrill in the fistula elected to stop any further intervention given I felt risk would likely exceed benefits.  A 4-0 Monocryl purse-string suture was sewn around the sheath.  The sheath was removed while tying down the suture.  A sterile bandage was applied to the puncture site.  COMPLICATIONS: None  CONDITION: Stable  Marty Heck, MD [facility] Office: [phone] Pager: [phone]  06/25/2019 9:55 AM

## 2019-06-25 NOTE — H&P (Signed)
Malfunctioning AV fistula    History of Present Illness: 68 y/o male presents for right arm fistulogram.  Malfunction of fistula and states dialysis has  Been unable to access since September.  No right arm symptoms otherwise.  Fistula created by Dr. Scot Dock on 01/19/2019. He presented to the hospital on 04/16/2019 chest pain and palpitations. EKG with ST elevation in the precordial leads. Code STEMI was called. He underwent Emergency CABG X 3 by Dr. Kipp Brood on 04/16/2019.    Past medical history includes: GERD, gout, HTN, HLD             Current Facility-Administered Medications  Medication Dose Route Frequency Provider Last Rate Last Dose  . 0.9 % sodium chloride infusion (Manually program via Guardrails IV Fluids)  Intravenous Once Larey Dresser, MD    . 0.9 % sodium chloride infusion (Manually program via Guardrails IV Fluids)  Intravenous Once Larey Dresser, MD    . 0.9 % sodium chloride infusion  Intra-arterial PRN Larey Dresser, MD 10 mL/hr at 04/21/19 2209   . 0.9 % sodium chloride infusion  Intravenous PRN Larey Dresser, MD  Stopped at 04/23/19 1146  . amiodarone (PACERONE) tablet 400 mg 400 mg Oral BID Larey Dresser, MD  400 mg at 04/27/19 [id number]  . apixaban (ELIQUIS) tablet 5 mg 5 mg Oral BID Larey Dresser, MD  5 mg at 04/27/19 [id number]  . aspirin EC tablet 81 mg 81 mg Oral Daily Larey Dresser, MD  81 mg at 04/27/19 [id number]  . B-complex with vitamin C tablet 1 tablet 1 tablet Oral Daily Larey Dresser, MD  1 tablet at 04/27/19 [phone]  . bisacodyl (DULCOLAX) EC tablet 10 mg 10 mg Oral Daily Larey Dresser, MD  10 mg at 04/27/19 [id number]   Or  . bisacodyl (DULCOLAX) suppository 10 mg 10 mg Rectal Daily Larey Dresser, MD    . Chlorhexidine Gluconate Cloth 2 % PADS 6 each 6 each Topical Daily Larey Dresser, MD  6 each at 04/26/19 1056  . Chlorhexidine Gluconate Cloth 2 % PADS 6 each 6 each Topical Q0600 Roney Jaffe, MD    . Derrill Memo ON 05/04/2019] Darbepoetin Alfa  (ARANESP) injection 60 mcg 60 mcg Intravenous Q Mon-HD Roney Jaffe, MD    . febuxostat (ULORIC) tablet 40 mg 40 mg Oral Manuela Neptune, MD  40 mg at 04/26/19 1047  . fluticasone (FLONASE) 50 MCG/ACT nasal spray 2 spray 2 spray Each Nare Daily Larey Dresser, MD  2 spray at 04/27/19 0930  . heparin 1000 UNIT/ML injection        . influenza vaccine adjuvanted (FLUAD) injection 0.5 mL 0.5 mL Intramuscular Tomorrow-1000 Lightfoot, Harrell O, MD    . lactated ringers infusion 500 mL 500 mL Intravenous Once PRN Larey Dresser, MD    . lactated ringers infusion  Intravenous Continuous Larey Dresser, MD    . lactated ringers infusion  Intravenous Continuous Larey Dresser, MD  Stopped at 04/22/19 0900  . MEDLINE mouth rinse 15 mL Mouth Rinse BID Larey Dresser, MD  15 mL at 04/27/19 0401  . menthol-cetylpyridinium (CEPACOL) lozenge 3 mg 1 lozenge Oral PRN Larey Dresser, MD    . metoprolol tartrate (LOPRESSOR) injection 2.5-5 mg 2.5-5 mg Intravenous Q2H PRN Larey Dresser, MD    . midodrine (PROAMATINE) 5 MG tablet        . [START ON 04/28/2019] midodrine (PROAMATINE) tablet 10 mg 10 mg  Oral TID WC Larey Dresser, MD    . midodrine (PROAMATINE) tablet 15 mg 15 mg Oral TID WC Larey Dresser, MD  15 mg at 04/27/19 1308  . morphine 2 MG/ML injection 1-4 mg 1-4 mg Intravenous Q1H PRN Larey Dresser, MD  2 mg at 04/25/19 0855  . ondansetron (ZOFRAN) injection 4 mg 4 mg Intravenous Q6H PRN Larey Dresser, MD  4 mg at 04/18/19 1230  . oxyCODONE (Oxy IR/ROXICODONE) immediate release tablet 5 mg 5 mg Oral Q6H PRN Larey Dresser, MD  5 mg at 04/27/19 [id number]  . pantoprazole (PROTONIX) EC tablet 40 mg 40 mg Oral Daily Larey Dresser, MD  40 mg at 04/27/19 [id number]  . pneumococcal 23 valent vaccine (PNU-IMMUNE) injection 0.5 mL 0.5 mL Intramuscular Tomorrow-1000 Lightfoot, Harrell O, MD    . rosuvastatin (CRESTOR) tablet 5 mg 5 mg Oral q1800 Larey Dresser, MD  5 mg at 04/26/19 1755   . sodium chloride flush (NS) 0.9 % injection 10-40 mL 10-40 mL Intracatheter Q12H Larey Dresser, MD  30 mL at 04/27/19 0932  . sodium chloride flush (NS) 0.9 % injection 10-40 mL 10-40 mL Intracatheter PRN Larey Dresser, MD    . sodium chloride flush (NS) 0.9 % injection 3 mL 3 mL Intravenous Q12H Larey Dresser, MD  3 mL at 04/27/19 0401  . sodium chloride flush (NS) 0.9 % injection 3 mL 3 mL Intravenous PRN Larey Dresser, MD    . traMADol Veatrice Bourbon) tablet 50 mg 50 mg Oral Q12H PRN Larey Dresser, MD  50 mg at 04/26/19 2238  . witch hazel-glycerin (TUCKS) pad  Topical PRN Lightfoot, Lucile Crater, MD     Pt meds include:  Statin :Yes  Betablocker: No  ASA: Yes  Other anticoagulants/antiplatelets: Plavix      Past Medical History:  Diagnosis Date  . Cervical disc disease   . Chronic kidney disease   . COLONIC POLYPS, HX OF 06/27/2007  . EXOGENOUS OBESITY 01/30/2010  . GERD (gastroesophageal reflux disease)    PMH  . GOUT 01/30/2010  . HYPERCHOLESTEROLEMIA 06/30/2007  . HYPERTENSION 06/27/2007  . LOW BACK PAIN 06/27/2007  . NEPHROLITHIASIS, HX OF 06/27/2007  . OSTEOARTHRITIS 06/27/2007  . SLEEP APNEA, OBSTRUCTIVE, MODERATE 01/27/2009  . Wears dentures    upper        Past Surgical History:  Procedure Laterality Date  . AV FISTULA PLACEMENT Right 01/19/2019   Procedure: RIGHT BRACHIOCEPHALIC ARTERIOVENOUS (AV) FISTULA CREATION; Surgeon: Angelia Mould, MD; Location: [facility]; Service: Vascular; Laterality: Right;  . CARDIAC CATHETERIZATION    . CARPAL TUNNEL RELEASE     left hand  . COLONOSCOPY     with polypectomy  . CORONARY ARTERY BYPASS GRAFT N/A 04/16/2019   Procedure: CORONARY ARTERY BYPASS GRAFTING (CABG)X3 , WITH ENDOSCOPIC HARVESTING OF RIGHT GREATER SAPHENOUS VEIN; Surgeon: Lajuana Matte, MD; Location: [facility]; Service: Open Heart Surgery; Laterality: N/A;  . CORONARY/GRAFT ACUTE MI REVASCULARIZATION N/A 04/16/2019   Procedure: CORONARY/GRAFT ACUTE MI  REVASCULARIZATION; Surgeon: Burnell Blanks, MD; Location: [facility]; Service: Cardiovascular; Laterality: N/A;  . JOINT REPLACEMENT Left 2005   knee  . LUMBAR LAMINECTOMY    . MULTIPLE TOOTH EXTRACTIONS    . RADIOFREQUENCY ABLATION KIDNEY    . RIGHT/LEFT HEART CATH AND CORONARY ANGIOGRAPHY N/A 04/16/2019   Procedure: RIGHT/LEFT HEART CATH AND CORONARY ANGIOGRAPHY; Surgeon: Burnell Blanks, MD; Location: [facility]; Service: Cardiovascular; Laterality: N/A;  .  TEE WITHOUT CARDIOVERSION N/A 04/16/2019   Procedure: TRANSESOPHAGEAL ECHOCARDIOGRAM (TEE); Surgeon: Lajuana Matte, MD; Location: [facility]; Service: Open Heart Surgery; Laterality: N/A;  . TOTAL KNEE ARTHROPLASTY     left  . VENTRICULAR ASSIST DEVICE INSERTION N/A 04/16/2019   Procedure: VENTRICULAR ASSIST DEVICE INSERTION; Surgeon: Burnell Blanks, MD; Location: [facility]; Service: Cardiovascular; Laterality: N/A;   Social History  Social History        Tobacco Use  . Smoking status: Former Smoker    Quit date: 07/23/1980    Years since quitting: 38.7  . Smokeless tobacco: Never Used  Substance Use Topics  . Alcohol use: Yes    Alcohol/week: 1.0 standard drinks    Types: 1 Cans of beer per week    Comment: rare  . Drug use: No   Family History       Family History  Problem Relation Age of Onset  . Hypertension Other   . Diabetes Sister   . Hyperlipidemia Sister   . Sleep apnea Sister         Allergies  Allergen Reactions  . Codeine Phosphate Other (See Comments)    Hyperactive    REVIEW OF SYSTEMS  General: [ ]  Weight loss, [ ]  Fever, [ ]  chills  Neurologic: [ ]  Dizziness, [ ]  Blackouts, [ ]  Seizure  [ ]  Stroke, [ ]  "Mini stroke", [ ]  Slurred speech, [ ]  Temporary blindness; [ ]  weakness in arms or legs, [ ]  Hoarseness [ ]  Dysphagia  Cardiac: [x ] Chest pain/pressure, [ ]  Shortness of breath at rest [ ]  Shortness of breath with exertion, [ ]  Atrial fibrillation  or irregular heartbeat  Vascular: [ ]  Pain in legs with walking, [ ]  Pain in legs at rest, [ ]  Pain in legs at night,  [ ]  Non-healing ulcer, [ ]  Blood clot in vein/DVT,  Pulmonary: [ ]  Home oxygen, [ ]  Productive cough, [ ]  Coughing up blood, [ ]  Asthma,  [ ]  Wheezing [ ]  COPD  Musculoskeletal: [ ]  Arthritis, [ ]  Low back pain, [ ]  Joint pain  Hematologic: [ ]  Easy Bruising, [ ]  Anemia; [ ]  Hepatitis  Gastrointestinal: [ ]  Blood in stool, [ ]  Gastroesophageal Reflux/heartburn,  Urinary: [ ]  chronic Kidney disease, [x ] on HD - [x ] MWF or [ ]  TTHS, [ ]  Burning with urination, [ ]  Difficulty urinating  Skin: [ ]  Rashes, [ ]  Wounds  Psychological: [ ]  Anxiety, [ ]  Depression  Physical Examination   Body mass index is 36.6 kg/m.  General: WDWN in NAD  HENT: WNL  Eyes: Pupils equal  Pulmonary: normal non-labored breathing , without Rales, rhonchi, wheezing  Cardiac: RRR, without Murmurs, rubs or gallops;  No carotid bruits  Abdomen: soft, NT, no masses  Skin: no rashes, ulcers noted; no Gangrene , no cellulitis; no open wounds;  Vascular Exam/Pulses:palpable radial, brachial pulses, good thrill in right arm brachiocephalic fistula.  Musculoskeletal: no muscle wasting or atrophy; no edema  Neurologic: A&O X 3; Appropriate Affect ;  SENSATION: normal;  MOTOR FUNCTION: 5/5 Symmetric  Speech is fluent/normal      ASSESSMENT/PLAN:  68 year old male with end-stage renal disease and malfunctioning right arm brachiocephalic AV fistula.  Fistula has excellent thrill on exam but he states they have been unable to access it since September and has been using a catheter.  Plan for right arm fistulogram today.  Marty Heck, MD [facility] Office: [phone]  Pager: [phone]  Marty Heck

## 2019-06-25 NOTE — Discharge Instructions (Signed)

## 2019-06-25 NOTE — Progress Notes (Signed)
Discharge instructions reviewed with patient and sister. Verbalized understanding. 

## 2019-06-26 ENCOUNTER — Encounter
Payer: Medicare Other | Attending: Physical Medicine and Rehabilitation | Admitting: Physical Medicine and Rehabilitation

## 2019-06-26 ENCOUNTER — Encounter (HOSPITAL_COMMUNITY): Payer: Self-pay | Admitting: Vascular Surgery

## 2019-06-26 ENCOUNTER — Other Ambulatory Visit: Payer: Self-pay

## 2019-06-26 VITALS — BP 108/54 | HR 63 | Temp 97.7°F | Ht 72.0 in | Wt 278.0 lb

## 2019-06-26 DIAGNOSIS — G5622 Lesion of ulnar nerve, left upper limb: Secondary | ICD-10-CM | POA: Diagnosis not present

## 2019-06-26 DIAGNOSIS — I2102 ST elevation (STEMI) myocardial infarction involving left anterior descending coronary artery: Secondary | ICD-10-CM | POA: Diagnosis present

## 2019-06-26 DIAGNOSIS — M792 Neuralgia and neuritis, unspecified: Secondary | ICD-10-CM | POA: Insufficient documentation

## 2019-06-26 DIAGNOSIS — R5381 Other malaise: Secondary | ICD-10-CM | POA: Insufficient documentation

## 2019-06-26 DIAGNOSIS — I953 Hypotension of hemodialysis: Secondary | ICD-10-CM | POA: Diagnosis present

## 2019-06-26 DIAGNOSIS — Z992 Dependence on renal dialysis: Secondary | ICD-10-CM | POA: Diagnosis present

## 2019-06-26 DIAGNOSIS — I4891 Unspecified atrial fibrillation: Secondary | ICD-10-CM | POA: Insufficient documentation

## 2019-06-26 DIAGNOSIS — S6402XS Injury of ulnar nerve at wrist and hand level of left arm, sequela: Secondary | ICD-10-CM | POA: Diagnosis not present

## 2019-06-26 DIAGNOSIS — S6402XA Injury of ulnar nerve at wrist and hand level of left arm, initial encounter: Secondary | ICD-10-CM | POA: Insufficient documentation

## 2019-06-26 DIAGNOSIS — N186 End stage renal disease: Secondary | ICD-10-CM | POA: Diagnosis not present

## 2019-06-26 DIAGNOSIS — Z951 Presence of aortocoronary bypass graft: Secondary | ICD-10-CM | POA: Insufficient documentation

## 2019-06-26 MED ORDER — PREGABALIN 100 MG PO CAPS: 100.0000 mg | ORAL_CAPSULE | Freq: Every day | ORAL | 5 refills | Status: DC

## 2019-06-26 NOTE — Progress Notes (Signed)
Subjective:    Patient ID: Alex Williams, male    DOB: July 19, 1951, 68 y.o.   MRN: [id number]  HPI  Patient is a 68 yr old male with ESRD, CABG x3 and L UE/hand nerve injury, and A Fib here for hospital f/u.  Now can "play the piano" with L hand, not the actual piano, and can close 4th and 5th digits but not 2nd and 3rd- and hand more swollen. Nerve pain- still hurts esp at night- when really hurts- hurts 9/10 at night. Not every night but many nights.  On gabapentin 100 mg on Dialysis days-changed by Dr Pamella Pert- his PCP-  but I had him on Lyrica 50 mg BID. Is actually taking both meds per pt.    Also had fistula was redone/surgical intervention yesterday to use it again.  Pain Inventory Average Pain 4 Pain Right Now 4 My pain is sharp, burning, stabbing and aching  In the last 24 hours, has pain interfered with the following? General activity 5 Relation with others 5 Enjoyment of life 9 What TIME of day is your pain at its worst? night Sleep (in general) Fair  Pain is worse with: some activites Pain improves with: heat/ice Relief from Meds: 3  Mobility walk with assistance use a cane use a walker how many minutes can you walk? 10 ability to climb steps?  yes do you drive?  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  no  Physicians involved in your care Any changes since last visit?  yes   Family History  Problem Relation Age of Onset  . Hypertension Other   . Diabetes Sister   . Hyperlipidemia Sister   . Sleep apnea Sister    Social History   Socioeconomic History  . Marital status: Legally Separated    Spouse name: Not on file  . Number of children: 2  . Years of education: Not on file  . Highest education level: Not on file  Occupational History  . Not on file  Social Needs  . Financial resource  strain: Not hard at all  . Food insecurity    Worry: Never true    Inability: Never true  . Transportation needs    Medical: No    Non-medical: No  Tobacco Use  . Smoking status: Former Smoker    Quit date: 07/23/1980    Years since quitting: 38.9  . Smokeless tobacco: Never Used  Substance and Sexual Activity  . Alcohol use: Yes    Alcohol/week: 1.0 standard drinks    Types: 1 Cans of beer per week    Comment: rare  . Drug use: No  . Sexual activity: Not Currently  Lifestyle  . Physical activity    Days per week: 2 days    Minutes per session: 20 min  . Stress: Not at all  Relationships  . Social Herbalist on phone: Three times a week    Gets together: More than three times a week    Attends religious service: More than 4 times per year    Active member of club or organization: No    Attends meetings of clubs or organizations: Never    Relationship status: Patient refused  Other Topics Concern  . Not on file  Social History Narrative   Pt lives alone. He has one son who  lives within 3 miles who he usually sees daily. He has two sisters living in the next town over who he also sees on a weekly basis. He is a religious man who attends church at least 3 Sundays each month.   He operates [employer] full time.    Past Surgical History:  Procedure Laterality Date  . AV FISTULA PLACEMENT Right 01/19/2019   Procedure: RIGHT BRACHIOCEPHALIC ARTERIOVENOUS (AV) FISTULA CREATION;  Surgeon: Angelia Mould, MD;  Location: [place];  Service: Vascular;  Laterality: Right;  . CARDIAC CATHETERIZATION    . CARPAL TUNNEL RELEASE     left hand  . COLONOSCOPY     with polypectomy  . CORONARY ARTERY BYPASS GRAFT N/A 04/16/2019   Procedure: CORONARY ARTERY BYPASS GRAFTING (CABG)X3  , WITH ENDOSCOPIC HARVESTING OF RIGHT GREATER SAPHENOUS VEIN;  Surgeon: Lajuana Matte, MD;  Location: [place];  Service: Open Heart Surgery;  Laterality: N/A;  . CORONARY/GRAFT ACUTE MI  REVASCULARIZATION N/A 04/16/2019   Procedure: CORONARY/GRAFT ACUTE MI REVASCULARIZATION;  Surgeon: Burnell Blanks, MD;  Location: [facility];  Service: Cardiovascular;  Laterality: N/A;  . INSERTION OF DIALYSIS CATHETER N/A 04/29/2019   Procedure: INSERTION OF TUNNELED DIALYSIS CATHETER, right internal jugular;  Surgeon: Rosetta Posner, MD;  Location: [place];  Service: Vascular;  Laterality: N/A;  . JOINT REPLACEMENT Left 2005   knee  . LUMBAR LAMINECTOMY    . MULTIPLE TOOTH EXTRACTIONS    . PERIPHERAL VASCULAR BALLOON ANGIOPLASTY Right 06/25/2019   Procedure: PERIPHERAL VASCULAR BALLOON ANGIOPLASTY;  Surgeon: Marty Heck, MD;  Location: [place] [facility];  Service: Cardiovascular;  Laterality: Right;  . RADIOFREQUENCY ABLATION KIDNEY    . RIGHT/LEFT HEART CATH AND CORONARY ANGIOGRAPHY N/A 04/16/2019   Procedure: RIGHT/LEFT HEART CATH AND CORONARY ANGIOGRAPHY;  Surgeon: Burnell Blanks, MD;  Location: [place] [facility];  Service: Cardiovascular;  Laterality: N/A;  . TEE WITHOUT CARDIOVERSION N/A 04/16/2019   Procedure: TRANSESOPHAGEAL ECHOCARDIOGRAM (TEE);  Surgeon: Lajuana Matte, MD;  Location: [place];  Service: Open Heart Surgery;  Laterality: N/A;  . TOTAL KNEE ARTHROPLASTY     left  . VENTRICULAR ASSIST DEVICE INSERTION N/A 04/16/2019   Procedure: VENTRICULAR ASSIST DEVICE INSERTION;  Surgeon: Burnell Blanks, MD;  Location: [place] [facility];  Service: Cardiovascular;  Laterality: N/A;   Past Medical History:  Diagnosis Date  . Cervical disc disease   . Chronic kidney disease   . COLONIC POLYPS, HX OF 06/27/2007  . EXOGENOUS OBESITY 01/30/2010  . GERD (gastroesophageal reflux disease)    PMH  . GOUT 01/30/2010  . HYPERCHOLESTEROLEMIA 06/30/2007  . HYPERTENSION 06/27/2007  . LOW BACK PAIN 06/27/2007  . NEPHROLITHIASIS, HX OF 06/27/2007  . OSTEOARTHRITIS 06/27/2007  . SLEEP APNEA, OBSTRUCTIVE, MODERATE 01/27/2009  . Wears dentures    upper    BP (!) 108/54   Pulse 63   Temp 97.7 F (36.5 C)   Ht 6' (1.829 m)   Wt 278 lb (126.1 kg)   SpO2 94%   BMI 37.70 kg/m   Opioid Risk Score:   Fall Risk Score:  `1  Depression screen PHQ 2/9  Depression screen [facility] 2/9 06/04/2019 05/26/2019 03/03/2019 01/26/2019 12/17/2018 12/12/2018 12/09/2018  Decreased Interest 0 1 0 0 0 0 0  Down, Depressed, Hopeless 0 0 0 0 0 0 0  PHQ - 2 Score 0 1 0 0 0 0 0  Altered sleeping - 1 - - - - -  Tired, decreased energy - 3 - - - - -  Change in appetite - 0 - - - - -  Feeling bad or failure about yourself  - 0 - - - - -  Trouble concentrating - 3 - - - - -  Moving slowly or fidgety/restless - 3 - - - - -  Suicidal thoughts - 0 - - - - -  PHQ-9 Score - 11 - - - - -  Difficult doing work/chores - Extremely dIfficult - - - - -  Some recent data might be hidden    Review of Systems  Gastrointestinal: Positive for constipation.  Neurological: Positive for numbness.       Tingling  Psychiatric/Behavioral: The patient is nervous/anxious.   All other systems reviewed and are negative.      Objective:   Physical Exam  Awake, alert, appropriate, in transfer w/c, accompanied by wife, NAD L hand puffy on dorsum of hand and fingers Deltoid, bicep and tricep 5/5; WE 4+ to 5-/5; Grip 2+/5, finger abduction 2/5 Decreased sensation in L lateral 4th digit and 5th digit (-) tinel's at wrist and elbow R hand mild to moderate swelling on R hand, but 3-4+ edema of L hand.      Assessment & Plan:  Patient is a 68 yr old male with ESRD, CABG x3 and L UE/hand nerve injury, and A Fib earlier (might not anymore per pt) and L hand nerve pain  1. Change Pregabalin to 2 tabs nightly- so should be able to not dialyze out the Pregabalin and since most of pain is at night. Changed Rx to 100 mg nightly gave 100 mg capsule at night.   2. Cardiology has to tell pt when he can drive again.   3. Order EMG/NCS to further assess likely L ulnar nerve injury and see if has  anything else as well. Because usually ulnar nerve doesn't usually cause the amount of swelling we see. Take tramadol- the day of EMG/NCS.  4. F/U for EMG/NCS with partners.  5. F/U after EMG/NCS  6. Taking Tramadol for L hand- if PCP has an issue writing for it, we can take over in future- if do, has to have a pain contract, with [place].  I spent a total of 25 minutes on appointment- more than 15 minutes educating pt on EMG/NCS and nerve pain meds.

## 2019-06-26 NOTE — Patient Instructions (Addendum)
Patient is a 68 yr old male with ESRD, CABG x3 and L UE/hand nerve injury, and A Fib earlier (might not anymore per pt) and L hand nerve pain  1. Change Pregabalin to 2 tabs nightly- so should be able to not dialyze out the Pregabalin and since most of pain is at night. Changed Rx to 100 mg nightly gave 100 mg capsule at night.   2. Cardiology has to tell pt when he can drive again.   3. Order EMG/NCS to further assess likely L ulnar nerve injury and see if has anything else as well. Because usually ulnar nerve doesn't usually cause the amount of swelling we see. Take tramadol- the day of EMG/NCS.  4. F/U for EMG/NCS with partners.  5. F/U after EMG/NCS  6. Taking Tramadol for L hand- if PCP has an issue writing for it, we can take over in future- if do, has to have a pain contract, with [place].

## 2019-06-30 ENCOUNTER — Other Ambulatory Visit (HOSPITAL_COMMUNITY): Payer: Medicare Other

## 2019-07-13 ENCOUNTER — Ambulatory Visit: Payer: Medicare Other | Admitting: Physical Medicine & Rehabilitation

## 2019-07-15 ENCOUNTER — Other Ambulatory Visit (HOSPITAL_COMMUNITY): Payer: Self-pay | Admitting: Cardiology

## 2019-07-30 ENCOUNTER — Ambulatory Visit (HOSPITAL_COMMUNITY): Payer: Medicare Other

## 2019-07-30 ENCOUNTER — Encounter: Payer: Medicare Other | Admitting: Physical Medicine & Rehabilitation

## 2019-07-30 ENCOUNTER — Encounter (HOSPITAL_COMMUNITY): Payer: Medicare Other | Admitting: Cardiology

## 2019-08-01 ENCOUNTER — Other Ambulatory Visit: Payer: Self-pay | Admitting: Family Medicine

## 2019-08-09 ENCOUNTER — Other Ambulatory Visit (HOSPITAL_COMMUNITY): Payer: Self-pay | Admitting: Cardiology

## 2019-08-18 DIAGNOSIS — E8770 Fluid overload, unspecified: Secondary | ICD-10-CM | POA: Insufficient documentation

## 2019-08-20 ENCOUNTER — Other Ambulatory Visit (HOSPITAL_COMMUNITY): Payer: Self-pay | Admitting: Cardiology

## 2019-08-21 ENCOUNTER — Other Ambulatory Visit: Payer: Self-pay

## 2019-08-21 ENCOUNTER — Encounter: Payer: Medicare Other | Admitting: Physical Medicine and Rehabilitation

## 2019-08-24 ENCOUNTER — Encounter: Payer: Self-pay | Admitting: Physical Medicine and Rehabilitation

## 2019-08-24 ENCOUNTER — Other Ambulatory Visit: Payer: Self-pay

## 2019-08-24 ENCOUNTER — Encounter
Payer: Medicare Other | Attending: Physical Medicine and Rehabilitation | Admitting: Physical Medicine and Rehabilitation

## 2019-08-24 VITALS — BP 111/59 | HR 69 | Ht 72.0 in | Wt 280.0 lb

## 2019-08-24 DIAGNOSIS — Z951 Presence of aortocoronary bypass graft: Secondary | ICD-10-CM | POA: Insufficient documentation

## 2019-08-24 DIAGNOSIS — S6402XS Injury of ulnar nerve at wrist and hand level of left arm, sequela: Secondary | ICD-10-CM

## 2019-08-24 DIAGNOSIS — I4891 Unspecified atrial fibrillation: Secondary | ICD-10-CM | POA: Insufficient documentation

## 2019-08-24 DIAGNOSIS — N186 End stage renal disease: Secondary | ICD-10-CM | POA: Diagnosis not present

## 2019-08-24 DIAGNOSIS — Z992 Dependence on renal dialysis: Secondary | ICD-10-CM

## 2019-08-24 DIAGNOSIS — R5381 Other malaise: Secondary | ICD-10-CM | POA: Insufficient documentation

## 2019-08-24 DIAGNOSIS — G5692 Unspecified mononeuropathy of left upper limb: Secondary | ICD-10-CM | POA: Diagnosis not present

## 2019-08-24 DIAGNOSIS — M792 Neuralgia and neuritis, unspecified: Secondary | ICD-10-CM | POA: Insufficient documentation

## 2019-08-24 DIAGNOSIS — I2102 ST elevation (STEMI) myocardial infarction involving left anterior descending coronary artery: Secondary | ICD-10-CM | POA: Insufficient documentation

## 2019-08-24 DIAGNOSIS — I953 Hypotension of hemodialysis: Secondary | ICD-10-CM | POA: Insufficient documentation

## 2019-08-24 NOTE — Progress Notes (Signed)
Subjective:    Patient ID: Alex Williams, male    DOB: 1950/10/11, 69 y.o.   MRN: [id number]  HPI    Patient is a 69 yr old male with ESRD, CABG x3 and L UE/hand nerve injury, and A Fib here for f/u.   LUE_ still tingling- on L hand and 4th and 5th digit on R hand But overall better since last saw him- 90% better.  Has to call in and reschedule.  Was sick.  Still taking Gabapentin and Lyrica- Needs more tramadol however.    Pain Inventory Average Pain 0 Pain Right Now 0 My pain is na  In the last 24 hours, has pain interfered with the following? General activity 0 Relation with others 0 Enjoyment of life 0 What TIME of day is your pain at its worst? na Sleep (in general) Fair  Pain is worse with: na Pain improves with: na Relief from Meds: na  Mobility walk with assistance use a cane use a walker how many minutes can you walk? 10 ability to climb steps?  yes do you drive?  no  Function retired  Neuro/Psych weakness numbness tingling  Prior Studies Any changes since last visit?  no  Physicians involved in your care Any changes since last visit?  no   Family History  Problem Relation Age of Onset  . Hypertension Other   . Diabetes Sister   . Hyperlipidemia Sister   . Sleep apnea Sister    Social History   Socioeconomic History  . Marital status: Legally Separated    Spouse name: Not on file  . Number of children: 2  . Years of education: Not on file  . Highest education level: Not on file  Occupational History  . Not on file  Tobacco Use  . Smoking status: Former Smoker    Quit date: 07/23/1980    Years since quitting: 39.1  . Smokeless tobacco: Never Used  Substance and Sexual Activity  . Alcohol use: Yes    Alcohol/week: 1.0 standard drinks    Types: 1 Cans of beer per week    Comment: rare  . Drug use: No  . Sexual activity: Not Currently  Other Topics Concern  . Not on file  Social History Narrative   Pt lives alone. He has  one son who lives within 3 miles who he usually sees daily. He has two sisters living in the next town over who he also sees on a weekly basis. He is a religious man who attends church at least 3 Sundays each month.   He operates [employer] full time.    Social Determinants of Health   Financial Resource Strain:   . Difficulty of Paying Living Expenses: Not on file  Food Insecurity:   . Worried About Charity fundraiser in the Last Year: Not on file  . Ran Out of Food in the Last Year: Not on file  Transportation Needs:   . Lack of Transportation (Medical): Not on file  . Lack of Transportation (Non-Medical): Not on file  Physical Activity: Insufficiently Active  . Days of Exercise per Week: 2 days  . Minutes of Exercise per Session: 20 min  Stress: No Stress Concern Present  . Feeling of Stress : Not at all  Social Connections: Unknown  . Frequency of Communication with Friends and Family: Three times a week  . Frequency of Social Gatherings with Friends and Family: More than three times a week  . Attends  Religious Services: More than 4 times per year  . Active Member of Clubs or Organizations: No  . Attends Archivist Meetings: Never  . Marital Status: Patient refused   Past Surgical History:  Procedure Laterality Date  . AV FISTULA PLACEMENT Right 01/19/2019   Procedure: RIGHT BRACHIOCEPHALIC ARTERIOVENOUS (AV) FISTULA CREATION;  Surgeon: Angelia Mould, MD;  Location: [place];  Service: Vascular;  Laterality: Right;  . CARDIAC CATHETERIZATION    . CARPAL TUNNEL RELEASE     left hand  . COLONOSCOPY     with polypectomy  . CORONARY ARTERY BYPASS GRAFT N/A 04/16/2019   Procedure: CORONARY ARTERY BYPASS GRAFTING (CABG)X3  , WITH ENDOSCOPIC HARVESTING OF RIGHT GREATER SAPHENOUS VEIN;  Surgeon: Lajuana Matte, MD;  Location: [place];  Service: Open Heart Surgery;  Laterality: N/A;  . CORONARY/GRAFT ACUTE MI REVASCULARIZATION N/A 04/16/2019   Procedure:  CORONARY/GRAFT ACUTE MI REVASCULARIZATION;  Surgeon: Burnell Blanks, MD;  Location: [place] [facility];  Service: Cardiovascular;  Laterality: N/A;  . INSERTION OF DIALYSIS CATHETER N/A 04/29/2019   Procedure: INSERTION OF TUNNELED DIALYSIS CATHETER, right internal jugular;  Surgeon: Rosetta Posner, MD;  Location: [place];  Service: Vascular;  Laterality: N/A;  . JOINT REPLACEMENT Left 2005   knee  . LUMBAR LAMINECTOMY    . MULTIPLE TOOTH EXTRACTIONS    . PERIPHERAL VASCULAR BALLOON ANGIOPLASTY Right 06/25/2019   Procedure: PERIPHERAL VASCULAR BALLOON ANGIOPLASTY;  Surgeon: Marty Heck, MD;  Location: [place] [facility];  Service: Cardiovascular;  Laterality: Right;  . RADIOFREQUENCY ABLATION KIDNEY    . RIGHT/LEFT HEART CATH AND CORONARY ANGIOGRAPHY N/A 04/16/2019   Procedure: RIGHT/LEFT HEART CATH AND CORONARY ANGIOGRAPHY;  Surgeon: Burnell Blanks, MD;  Location: [place] [facility];  Service: Cardiovascular;  Laterality: N/A;  . TEE WITHOUT CARDIOVERSION N/A 04/16/2019   Procedure: TRANSESOPHAGEAL ECHOCARDIOGRAM (TEE);  Surgeon: Lajuana Matte, MD;  Location: [place];  Service: Open Heart Surgery;  Laterality: N/A;  . TOTAL KNEE ARTHROPLASTY     left  . VENTRICULAR ASSIST DEVICE INSERTION N/A 04/16/2019   Procedure: VENTRICULAR ASSIST DEVICE INSERTION;  Surgeon: Burnell Blanks, MD;  Location: [place] [facility];  Service: Cardiovascular;  Laterality: N/A;   Past Medical History:  Diagnosis Date  . Cervical disc disease   . Chronic kidney disease   . COLONIC POLYPS, HX OF 06/27/2007  . EXOGENOUS OBESITY 01/30/2010  . GERD (gastroesophageal reflux disease)    PMH  . GOUT 01/30/2010  . HYPERCHOLESTEROLEMIA 06/30/2007  . HYPERTENSION 06/27/2007  . LOW BACK PAIN 06/27/2007  . NEPHROLITHIASIS, HX OF 06/27/2007  . OSTEOARTHRITIS 06/27/2007  . SLEEP APNEA, OBSTRUCTIVE, MODERATE 01/27/2009  . Wears dentures    upper   BP (!) 111/59 Comment: pt reported, virtual  visit  Pulse 69 Comment: pt reported, virtual visit  Ht 6' (1.829 m) Comment: pt reported, virtual visit  Wt 280 lb (127 kg) Comment: pt reported, virtual visit  BMI 37.97 kg/m   Opioid Risk Score:   Fall Risk Score:  `1  Depression screen PHQ 2/9  Depression screen [facility] 2/9 06/04/2019 05/26/2019 03/03/2019 01/26/2019 12/17/2018 12/12/2018 12/09/2018  Decreased Interest 0 1 0 0 0 0 0  Down, Depressed, Hopeless 0 0 0 0 0 0 0  PHQ - 2 Score 0 1 0 0 0 0 0  Altered sleeping - 1 - - - - -  Tired, decreased energy - 3 - - - - -  Change in appetite -  0 - - - - -  Feeling bad or failure about yourself  - 0 - - - - -  Trouble concentrating - 3 - - - - -  Moving slowly or fidgety/restless - 3 - - - - -  Suicidal thoughts - 0 - - - - -  PHQ-9 Score - 11 - - - - -  Difficult doing work/chores - Extremely dIfficult - - - - -  Some recent data might be hidden     Review of Systems  Neurological: Positive for weakness and numbness.       Tingling  All other systems reviewed and are negative.      Objective:   Physical Exam   Phone call On Dialysis currently at dialysis center    Assessment & Plan:    1. Don't reschedule EMG/NCS- since L hand 90% better  2. Only pain has is L knee from walking-which is chronic.   3. If needs tramadol, can see me, but would need to continue to see me- pt reports will get tramadol from PCP.  4  F/U as needed

## 2019-08-24 NOTE — Patient Instructions (Signed)
Patient is a 69 yr old male with ESRD, CABG x3 and L UE/hand nerve injury, and A Fib here for hospital f/u.   1. Don't reschedule EMG/NCS- since L hand 90% better  2. Only pain has is L knee from walking-which is chronic.   3. If needs tramadol, can see me, but would need to continue to see me- pt reports will get tramadol from PCP.  4  F/U as needed

## 2019-09-02 ENCOUNTER — Other Ambulatory Visit: Payer: Self-pay | Admitting: Family Medicine

## 2019-09-03 ENCOUNTER — Encounter: Payer: Self-pay | Admitting: Family Medicine

## 2019-09-03 ENCOUNTER — Other Ambulatory Visit: Payer: Self-pay

## 2019-09-03 ENCOUNTER — Ambulatory Visit (INDEPENDENT_AMBULATORY_CARE_PROVIDER_SITE_OTHER): Payer: Medicare Other | Admitting: Family Medicine

## 2019-09-03 VITALS — BP 98/54 | HR 70 | Temp 98.0°F | Ht 72.0 in | Wt 296.2 lb

## 2019-09-03 DIAGNOSIS — G5692 Unspecified mononeuropathy of left upper limb: Secondary | ICD-10-CM | POA: Diagnosis not present

## 2019-09-03 DIAGNOSIS — M25562 Pain in left knee: Secondary | ICD-10-CM

## 2019-09-03 DIAGNOSIS — N186 End stage renal disease: Secondary | ICD-10-CM

## 2019-09-03 DIAGNOSIS — Z951 Presence of aortocoronary bypass graft: Secondary | ICD-10-CM

## 2019-09-03 DIAGNOSIS — K644 Residual hemorrhoidal skin tags: Secondary | ICD-10-CM | POA: Diagnosis not present

## 2019-09-03 DIAGNOSIS — Z992 Dependence on renal dialysis: Secondary | ICD-10-CM

## 2019-09-03 MED ORDER — HYDROCORTISONE (PERIANAL) 2.5 % EX CREA: 1.0000 "application " | TOPICAL_CREAM | Freq: Two times a day (BID) | CUTANEOUS | 3 refills | Status: DC

## 2019-09-03 MED ORDER — TRAMADOL HCL 50 MG PO TABS: 50.0000 mg | ORAL_TABLET | Freq: Two times a day (BID) | ORAL | 2 refills | Status: DC | PRN

## 2019-09-03 NOTE — Progress Notes (Signed)
2/11/202110:56 AM  [place] A Daughety 1951/05/13, 69 y.o., male [id number]  Chief Complaint  Patient presents with  . Medical Management of Chronic Issues    3 month f/u    HPI:   Patient is a 69 y.o. male with past medical history significant for HTN, HLP, gout, CKD on HD, MI s/p 3V CABG, knee OAwho presents today for routine followup  Last OV Nov 2020 - started gabapentin for left hand paresthesia Has been seeing PMR for same reason - added lyrica - pain well controlled  Overall he is doing well Has no acute concerns today  Takes tramadol prn, mostly for left knee pain 2/2 OA, pmp reviewed  He reports that is BP has been low since CABG, he goes as low 70/50s during HD  HD on M/W/F - 4 hours Uses walker mostly for long trips Still urinates  Has upcoming appt with cards  Has occasional bleeding for known hemorrhoids, not painful, no issues with constipation Does not have any medication for hemorrhoids  Wt Readings from Last 3 Encounters:  09/03/19 296 lb 3.2 oz (134.4 kg)  08/24/19 280 lb (127 kg)  06/26/19 278 lb (126.1 kg)    Depression screen [facility] 2/9 09/03/2019 06/04/2019 05/26/2019  Decreased Interest 0 0 1  Down, Depressed, Hopeless 0 0 0  PHQ - 2 Score 0 0 1  Altered sleeping - - 1  Tired, decreased energy - - 3  Change in appetite - - 0  Feeling bad or failure about yourself  - - 0  Trouble concentrating - - 3  Moving slowly or fidgety/restless - - 3  Suicidal thoughts - - 0  PHQ-9 Score - - 11  Difficult doing work/chores - - Extremely dIfficult  Some recent data might be hidden    Fall Risk  09/03/2019 08/24/2019 06/26/2019 06/04/2019 05/26/2019  Falls in the past year? 0 0 1 1 1   Number falls in past yr: 0 - 0 0 0  Comment - - - - last fall 2 weeks ago  Injury with Fall? 0 - - 0 -  Comment - - - - -  Risk for fall due to : - - - History of fall(s);Impaired balance/gait;Impaired mobility;Impaired vision;Orthopedic patient -  Follow up Falls evaluation  completed - - - -     Allergies  Allergen Reactions  . Codeine Phosphate Other (See Comments)    Hyperactive     Prior to Admission medications   Medication Sig Start Date End Date Taking? Authorizing Provider  allopurinol (ZYLOPRIM) 100 MG tablet Take 1 tablet (100 mg total) by mouth daily. 05/08/19  Yes Angiulli, Lavon Paganini, PA-C  amiodarone (PACERONE) 200 MG tablet TAKE 1 TABLET BY MOUTH EVERY DAY 08/10/19  Yes Larey Dresser, MD  aspirin EC 81 MG EC tablet Take 1 tablet (81 mg total) by mouth daily. 05/01/19  Yes Conte, Tessa N, PA-C  B Complex-C (B-COMPLEX WITH VITAMIN C) tablet Take 1 tablet by mouth daily. 05/01/19  Yes Conte, Tessa N, PA-C  bisoprolol (ZEBETA) 5 MG tablet Take 1 tablet (5 mg total) by mouth as directed. On NON Dialysis days (T,Th,Sat,Sun) 06/11/19  Yes Larey Dresser, MD  cinacalcet (SENSIPAR) 30 MG tablet Take 1 tablet (30 mg total) by mouth daily with supper. 05/22/19  Yes Lightfoot, Lucile Crater, MD  diclofenac sodium (VOLTAREN) 1 % GEL Apply 2 g topically 4 (four) times daily. Patient taking differently: Apply 2 g topically as needed (Right knee).  05/07/19  Yes Angiulli, Lavon Paganini, PA-C  ELIQUIS 5 MG TABS tablet TAKE 1 TABLET BY MOUTH TWICE A DAY 08/20/19  Yes Larey Dresser, MD  ferric citrate (AURYXIA) 1 GM 210 MG(Fe) tablet Take 420 mg by mouth 3 (three) times daily with meals.    Yes [provider]  fluticasone (FLONASE) 50 MCG/ACT nasal spray Place 2 sprays into both nostrils daily. Patient taking differently: Place 2 sprays into both nostrils daily as needed for allergies or rhinitis.  09/16/18  Yes [name], MD  gabapentin (NEURONTIN) 100 MG capsule Take 100mg  by mouth at bedtime on HD days (M/W/F) 06/04/19  Yes [name], MD  midodrine (PROAMATINE) 10 MG tablet Take 1 tablet (10 mg total) by mouth every Monday, Wednesday, and Friday with hemodialysis. 05/08/19  Yes Angiulli, Lavon Paganini, PA-C  midodrine (PROAMATINE) 5 MG tablet Take 5 mg  by mouth as directed. On NON Dialysis days (T,Th,Sat,Sun)   Yes [provider]  multivitamin (RENA-VIT) TABS tablet Take 1 tablet by mouth at bedtime. 05/07/19  Yes Angiulli, Lavon Paganini, PA-C  pantoprazole (PROTONIX) 40 MG tablet Take 1 tablet (40 mg total) by mouth daily. 05/07/19  Yes Angiulli, Lavon Paganini, PA-C  pregabalin (LYRICA) 100 MG capsule Take 1 capsule (100 mg total) by mouth at bedtime. 06/26/19 06/25/20 Yes Lovorn, Jinny Blossom, MD  psyllium (HYDROCIL/METAMUCIL) 95 % PACK Take 1 packet by mouth 2 (two) times daily. 05/07/19  Yes Angiulli, Lavon Paganini, PA-C  rosuvastatin (CRESTOR) 10 MG tablet Take 1 tablet (10 mg total) by mouth daily at 6 PM. 06/11/19  Yes Larey Dresser, MD  traMADol (ULTRAM) 50 MG tablet Take 1 tablet (50 mg total) by mouth every 12 (twelve) hours as needed for moderate pain. 05/07/19  Yes Angiulli, Lavon Paganini, PA-C    Past Medical History:  Diagnosis Date  . Cervical disc disease   . Chronic kidney disease   . COLONIC POLYPS, HX OF 06/27/2007  . EXOGENOUS OBESITY 01/30/2010  . GERD (gastroesophageal reflux disease)    PMH  . GOUT 01/30/2010  . HYPERCHOLESTEROLEMIA 06/30/2007  . HYPERTENSION 06/27/2007  . LOW BACK PAIN 06/27/2007  . NEPHROLITHIASIS, HX OF 06/27/2007  . OSTEOARTHRITIS 06/27/2007  . SLEEP APNEA, OBSTRUCTIVE, MODERATE 01/27/2009  . Wears dentures    upper    Past Surgical History:  Procedure Laterality Date  . AV FISTULA PLACEMENT Right 01/19/2019   Procedure: RIGHT BRACHIOCEPHALIC ARTERIOVENOUS (AV) FISTULA CREATION;  Surgeon: Angelia Mould, MD;  Location: [place];  Service: Vascular;  Laterality: Right;  . CARDIAC CATHETERIZATION    . CARPAL TUNNEL RELEASE     left hand  . COLONOSCOPY     with polypectomy  . CORONARY ARTERY BYPASS GRAFT N/A 04/16/2019   Procedure: CORONARY ARTERY BYPASS GRAFTING (CABG)X3  , WITH ENDOSCOPIC HARVESTING OF RIGHT GREATER SAPHENOUS VEIN;  Surgeon: Lajuana Matte, MD;  Location: [place];  Service: Open Heart  Surgery;  Laterality: N/A;  . CORONARY/GRAFT ACUTE MI REVASCULARIZATION N/A 04/16/2019   Procedure: CORONARY/GRAFT ACUTE MI REVASCULARIZATION;  Surgeon: Burnell Blanks, MD;  Location: [place] [facility];  Service: Cardiovascular;  Laterality: N/A;  . INSERTION OF DIALYSIS CATHETER N/A 04/29/2019   Procedure: INSERTION OF TUNNELED DIALYSIS CATHETER, right internal jugular;  Surgeon: Rosetta Posner, MD;  Location: [place];  Service: Vascular;  Laterality: N/A;  . JOINT REPLACEMENT Left 2005   knee  . LUMBAR LAMINECTOMY    . MULTIPLE TOOTH EXTRACTIONS    . PERIPHERAL VASCULAR  BALLOON ANGIOPLASTY Right 06/25/2019   Procedure: PERIPHERAL VASCULAR BALLOON ANGIOPLASTY;  Surgeon: Marty Heck, MD;  Location: [facility];  Service: Cardiovascular;  Laterality: Right;  . RADIOFREQUENCY ABLATION KIDNEY    . RIGHT/LEFT HEART CATH AND CORONARY ANGIOGRAPHY N/A 04/16/2019   Procedure: RIGHT/LEFT HEART CATH AND CORONARY ANGIOGRAPHY;  Surgeon: Burnell Blanks, MD;  Location: [place] [facility];  Service: Cardiovascular;  Laterality: N/A;  . TEE WITHOUT CARDIOVERSION N/A 04/16/2019   Procedure: TRANSESOPHAGEAL ECHOCARDIOGRAM (TEE);  Surgeon: Lajuana Matte, MD;  Location: [place];  Service: Open Heart Surgery;  Laterality: N/A;  . TOTAL KNEE ARTHROPLASTY     left  . VENTRICULAR ASSIST DEVICE INSERTION N/A 04/16/2019   Procedure: VENTRICULAR ASSIST DEVICE INSERTION;  Surgeon: Burnell Blanks, MD;  Location: [place] [facility];  Service: Cardiovascular;  Laterality: N/A;    Social History   Tobacco Use  . Smoking status: Former Smoker    Quit date: 07/23/1980    Years since quitting: 39.1  . Smokeless tobacco: Never Used  Substance Use Topics  . Alcohol use: Yes    Alcohol/week: 1.0 standard drinks    Types: 1 Cans of beer per week    Comment: rare    Family History  Problem Relation Age of Onset  . Hypertension Other   . Diabetes Sister   . Hyperlipidemia Sister    . Sleep apnea Sister     Review of Systems  Constitutional: Negative for chills and fever.  Respiratory: Negative for cough and shortness of breath.   Cardiovascular: Negative for chest pain, palpitations and leg swelling.  Gastrointestinal: Negative for abdominal pain, nausea and vomiting.  Neurological: Negative for dizziness and headaches.     OBJECTIVE:  Today's Vitals   09/03/19 1040 09/03/19 1055  BP: (!) 80/50 (!) 98/54  Pulse: 70   Temp: 98 F (36.7 C)   TempSrc: Temporal   SpO2: 98%   Weight: 296 lb 3.2 oz (134.4 kg)   Height: 6' (1.829 m)    Body mass index is 40.17 kg/m.   Physical Exam Vitals and nursing note reviewed.  Constitutional:      Appearance: He is well-developed.  HENT:     Head: Normocephalic and atraumatic.  Eyes:     Conjunctiva/sclera: Conjunctivae normal.     Pupils: Pupils are equal, round, and reactive to light.  Cardiovascular:     Rate and Rhythm: Normal rate and regular rhythm.     Heart sounds: No murmur. No friction rub. No gallop.   Pulmonary:     Effort: Pulmonary effort is normal.     Breath sounds: Normal breath sounds. No wheezing or rales.  Musculoskeletal:     Cervical back: Neck supple.     Right lower leg: No edema.     Left lower leg: No edema.  Skin:    General: Skin is warm and dry.  Neurological:     Mental Status: He is alert and oriented to person, place, and time.     No results found for this or any previous visit (from the past 24 hour(s)).  No results found.   ASSESSMENT and PLAN  1. 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 Continue current regime.   2. Recurrent pain of left knee Stable. pmp reviewed. Tramadol prn  3. External hemorrhoids rx for anusol given  4. ESRD on dialysis [facility]) Managed by renal  5. S/P CABG x 3 Managed by cards  Other orders -  traMADol (ULTRAM) 50 MG tablet; Take 1 tablet (50 mg total) by mouth every 12 (twelve) hours as needed for  moderate pain. - hydrocortisone (ANUSOL-HC) 2.5 % rectal cream; Place 1 application rectally 2 (two) times daily.  Return in about 6 months (around 03/02/2020).    [name], MD Primary Care at [place], [place] [address] Ph.  [phone] Fax [phone]

## 2019-09-03 NOTE — Patient Instructions (Signed)
° ° ° °  If you have lab work done today you will be contacted with your lab results within the next 2 weeks.  If you have not heard from us then please contact us. The fastest way to get your results is to register for My Chart. ° ° °IF you received an x-ray today, you will receive an invoice from [facility]. Please contact [facility] at [phone] with questions or concerns regarding your invoice.  ° °IF you received labwork today, you will receive an invoice from [facility]. Please contact [facility] at [phone] with questions or concerns regarding your invoice.  ° °Our billing staff will not be able to assist you with questions regarding bills from these companies. ° °You will be contacted with the lab results as soon as they are available. The fastest way to get your results is to activate your My Chart account. Instructions are located on the last page of this paperwork. If you have not heard from us regarding the results in 2 weeks, please contact this office. °  ° ° ° °

## 2019-09-04 ENCOUNTER — Other Ambulatory Visit (HOSPITAL_COMMUNITY): Payer: Self-pay | Admitting: Cardiology

## 2019-09-24 ENCOUNTER — Ambulatory Visit (HOSPITAL_COMMUNITY)
Admission: RE | Admit: 2019-09-24 | Discharge: 2019-09-24 | Disposition: A | Discharge status: Home / Self Care | Payer: Medicare Other | Source: Ambulatory Visit | Attending: Cardiology | Admitting: Cardiology

## 2019-09-24 ENCOUNTER — Other Ambulatory Visit: Payer: Self-pay

## 2019-09-24 DIAGNOSIS — I4891 Unspecified atrial fibrillation: Secondary | ICD-10-CM | POA: Insufficient documentation

## 2019-09-24 DIAGNOSIS — Z951 Presence of aortocoronary bypass graft: Secondary | ICD-10-CM | POA: Insufficient documentation

## 2019-09-24 DIAGNOSIS — I081 Rheumatic disorders of both mitral and tricuspid valves: Secondary | ICD-10-CM | POA: Insufficient documentation

## 2019-09-24 NOTE — Progress Notes (Signed)
  Echocardiogram 2D Echocardiogram has been performed.  Alex Williams 09/24/2019, 9:39 AM

## 2019-09-29 ENCOUNTER — Encounter (HOSPITAL_COMMUNITY): Payer: Self-pay | Admitting: Cardiology

## 2019-09-29 ENCOUNTER — Other Ambulatory Visit: Payer: Self-pay

## 2019-09-29 ENCOUNTER — Ambulatory Visit (HOSPITAL_COMMUNITY)
Admission: RE | Admit: 2019-09-29 | Discharge: 2019-09-29 | Disposition: A | Discharge status: Home / Self Care | Payer: Medicare Other | Source: Ambulatory Visit | Attending: Cardiology | Admitting: Cardiology

## 2019-09-29 VITALS — BP 100/66 | HR 61 | Wt 301.6 lb

## 2019-09-29 DIAGNOSIS — Z7982 Long term (current) use of aspirin: Secondary | ICD-10-CM | POA: Insufficient documentation

## 2019-09-29 DIAGNOSIS — I2102 ST elevation (STEMI) myocardial infarction involving left anterior descending coronary artery: Secondary | ICD-10-CM

## 2019-09-29 DIAGNOSIS — Z7901 Long term (current) use of anticoagulants: Secondary | ICD-10-CM | POA: Insufficient documentation

## 2019-09-29 DIAGNOSIS — I132 Hypertensive heart and chronic kidney disease with heart failure and with stage 5 chronic kidney disease, or end stage renal disease: Secondary | ICD-10-CM | POA: Diagnosis not present

## 2019-09-29 DIAGNOSIS — Z8249 Family history of ischemic heart disease and other diseases of the circulatory system: Secondary | ICD-10-CM | POA: Insufficient documentation

## 2019-09-29 DIAGNOSIS — E785 Hyperlipidemia, unspecified: Secondary | ICD-10-CM | POA: Diagnosis not present

## 2019-09-29 DIAGNOSIS — Z87891 Personal history of nicotine dependence: Secondary | ICD-10-CM | POA: Insufficient documentation

## 2019-09-29 DIAGNOSIS — Z951 Presence of aortocoronary bypass graft: Secondary | ICD-10-CM | POA: Insufficient documentation

## 2019-09-29 DIAGNOSIS — M109 Gout, unspecified: Secondary | ICD-10-CM | POA: Diagnosis not present

## 2019-09-29 DIAGNOSIS — I255 Ischemic cardiomyopathy: Secondary | ICD-10-CM | POA: Diagnosis not present

## 2019-09-29 DIAGNOSIS — I480 Paroxysmal atrial fibrillation: Secondary | ICD-10-CM | POA: Diagnosis not present

## 2019-09-29 DIAGNOSIS — Z791 Long term (current) use of non-steroidal anti-inflammatories (NSAID): Secondary | ICD-10-CM | POA: Insufficient documentation

## 2019-09-29 DIAGNOSIS — I5022 Chronic systolic (congestive) heart failure: Secondary | ICD-10-CM | POA: Insufficient documentation

## 2019-09-29 DIAGNOSIS — N186 End stage renal disease: Secondary | ICD-10-CM

## 2019-09-29 DIAGNOSIS — I080 Rheumatic disorders of both mitral and aortic valves: Secondary | ICD-10-CM | POA: Insufficient documentation

## 2019-09-29 DIAGNOSIS — I2510 Atherosclerotic heart disease of native coronary artery without angina pectoris: Secondary | ICD-10-CM | POA: Insufficient documentation

## 2019-09-29 DIAGNOSIS — Z79899 Other long term (current) drug therapy: Secondary | ICD-10-CM | POA: Diagnosis not present

## 2019-09-29 DIAGNOSIS — Z992 Dependence on renal dialysis: Secondary | ICD-10-CM | POA: Insufficient documentation

## 2019-09-29 DIAGNOSIS — I484 Atypical atrial flutter: Secondary | ICD-10-CM | POA: Diagnosis not present

## 2019-09-29 LAB — COMPREHENSIVE METABOLIC PANEL
ALT: 13 U/L (ref 0–44)
AST: 13 U/L — ABNORMAL LOW (ref 15–41)
Albumin: 3 g/dL — ABNORMAL LOW (ref 3.5–5.0)
Alkaline Phosphatase: 79 U/L (ref 38–126)
Anion gap: 13 (ref 5–15)
BUN: 35 mg/dL — ABNORMAL HIGH (ref 8–23)
CO2: 30 mmol/L (ref 22–32)
Calcium: 9.1 mg/dL (ref 8.9–10.3)
Chloride: 97 mmol/L — ABNORMAL LOW (ref 98–111)
Creatinine, Ser: 7.95 mg/dL — ABNORMAL HIGH (ref 0.61–1.24)
GFR calc Af Amer: 7 mL/min — ABNORMAL LOW (ref >60)
GFR calc non Af Amer: 6 mL/min — ABNORMAL LOW (ref >60)
Glucose, Bld: 92 mg/dL (ref 70–99)
Potassium: 5 mmol/L (ref 3.5–5.1)
Sodium: 140 mmol/L (ref 135–145)
Total Bilirubin: 0.8 mg/dL (ref 0.3–1.2)
Total Protein: 5.8 g/dL — ABNORMAL LOW (ref 6.5–8.1)

## 2019-09-29 LAB — CBC
HCT: 28 % — ABNORMAL LOW (ref 39.0–52.0)
Hemoglobin: 8.3 g/dL — ABNORMAL LOW (ref 13.0–17.0)
MCH: 27 pg (ref 26.0–34.0)
MCHC: 29.6 g/dL — ABNORMAL LOW (ref 30.0–36.0)
MCV: 91.2 fL (ref 80.0–100.0)
Platelets: 165 10*3/uL (ref 150–400)
RBC: 3.07 MIL/uL — ABNORMAL LOW (ref 4.22–5.81)
RDW: 18.6 % — ABNORMAL HIGH (ref 11.5–15.5)
WBC: 6.3 10*3/uL (ref 4.0–10.5)
nRBC: 0 % (ref 0.0–0.2)

## 2019-09-29 LAB — TSH: TSH: 2.541 u[IU]/mL (ref 0.350–4.500)

## 2019-09-29 MED ORDER — ROSUVASTATIN CALCIUM 10 MG PO TABS: 10.0000 mg | ORAL_TABLET | Freq: Every day | ORAL | 3 refills | Status: AC

## 2019-09-29 NOTE — Progress Notes (Signed)
Advanced Heart Failure Clinic Note   Referring Physician: PCP: [name], MD HF Cardiology: Dr. Aundra Dubin  HPI:  69 y.o. with history of ESRD (started on HD in 2020), gout, HTN, hyperlipidemia was admitted to [facility] 04/16/19 for chest pain and palpitations.  On arrival to the ED, he was noted to be in atypical atrial flutter in the 130s-140s with acute anterior infarction. CODE STEMI activated.   He was brought to the cath lab and noted to be hypotensive with SBP in 80s. Norepinephrine was started and Impella was placed.  LHC and RHC were done showing mean RA 15, PA 68/32 mean 45, CI 3.3.  There was 90% distal left main stenosis into the LCx, totally occluded mid LAD after diagonal, 90% mild LCx, 50% ostial RCA, 99% ostial PDA.  POBA to mid LAD but unable to restore flow.   It was suspected that the LAD was a chronic occlusion and the onset of atypical aflutter with RVR drove chest pain and ischemia (via severe distal left main stenosis).  Hs-TnI returned at 1297. Echo showed EF 25% range with apical and peri-apical severe hypokinesis.   Patient was taken to the OR for urgent CABGx 3 by Dr. Kipp Brood. Unable to graft LAD, had LIMA-D2, SVG-OM2, and SVG-PDA. He required dobutamine and NE post op and was eventually weaned off and Impella removed. Afib/flutter treated w/ amiodarone and converted to NSR. Was placed on Eliquis for anticoagulation. Required CVVH for volume removal. Later transitioned to HD. Hypotension limited aggressive HF therapy. Was placed on midodrine to support BP during HD. Once stable, he was transferred to SNF for rehab. He is now back at home.   Echo was done today and reviewed, EF 35-40% with moderate-severe eccentric MR and mild AS.   He returns today for followup of CHF.  He is taking bisoprolol on non-HD days and taking midodrine pre-HD. Uses walker because of knee pain.  No chest pain.  He is driving again. No dyspnea walking on flat ground but does not go very far  because of knee pain.  BP still dropping with HD.  No orthopnea/PND.   ECG: NSR, lateral TWIs (personally reviewed)  Labs (11/20): LDL 60, HDL 31  PMH: 1. ESRD: MWF HD. 2. Gout 3. HTN 4. Hyperlipidemia 5. Anemia of renal disease.  6. Atypical atrial flutter: Noted 9/20.  7. Atrial fibrillation: Paroxysmal.  Noted after CABG in 9/20.  8. Chronic systolic CHF: Ischemic cardiomyopathy.   - Echo (9/20): EF 30%, akinesis of the distal 1/2-2/3 of LV.  - Echo (3/21): EF 35-40%, peri-apical akinesis, moderate-severe eccentric MR, mild AS with mean gradient 18 mmHg.  9. CAD: LHC in 9/20 showed 90% dLM, totally occluded mLAD (probably CTO), 90% mid LCx, 99% PDA.  Attempted POBA LAD but unable to restore flow.  - CABG (9/20): SVG-PDA, SVG-OM2, LIMA-D2.  No good LAD target.  10. Aortic stenosis: Mild on 3/21 echo.  11. Mitral regurgitation: Moderate-severe on 3/21 echo.   Review of Systems: All systems reviewed and negative except as per HPI.   Current Outpatient Medications  Medication Sig Dispense Refill  . allopurinol (ZYLOPRIM) 100 MG tablet Take 1 tablet (100 mg total) by mouth daily. 30 tablet 0  . aspirin EC 81 MG EC tablet Take 1 tablet (81 mg total) by mouth daily.    . bisoprolol (ZEBETA) 5 MG tablet Take 1 tablet as directed on non dialysis tues,thurs,sat,sun needs appt 90 tablet 0  . cinacalcet (SENSIPAR) 30 MG tablet Take  1 tablet (30 mg total) by mouth daily with supper. 60 tablet 0  . diclofenac Sodium (VOLTAREN) 1 % GEL Apply 2 g topically daily as needed (for knee).    Marland Kitchen ELIQUIS 5 MG TABS tablet TAKE 1 TABLET BY MOUTH TWICE A DAY 60 tablet 1  . ferric citrate (AURYXIA) 1 GM 210 MG(Fe) tablet Take 420 mg by mouth 3 (three) times daily with meals.     . fluticasone (FLONASE) 50 MCG/ACT nasal spray Place 1 spray into both nostrils daily as needed for allergies or rhinitis.    Marland Kitchen gabapentin (NEURONTIN) 100 MG capsule Take 100mg  by mouth at bedtime on HD days (M/W/F) 30 capsule 2  .  midodrine (PROAMATINE) 10 MG tablet Take 1 tablet (10 mg total) by mouth every Monday, Wednesday, and Friday with hemodialysis. 30 tablet 0  . multivitamin (RENA-VIT) TABS tablet Take 1 tablet by mouth at bedtime. 30 tablet 0  . pantoprazole (PROTONIX) 40 MG tablet Take 40 mg by mouth daily as needed.    . pregabalin (LYRICA) 100 MG capsule Take 1 capsule (100 mg total) by mouth at bedtime. 30 capsule 5  . traMADol (ULTRAM) 50 MG tablet Take 1 tablet (50 mg total) by mouth every 12 (twelve) hours as needed for moderate pain. 30 tablet 2  . rosuvastatin (CRESTOR) 10 MG tablet Take 1 tablet (10 mg total) by mouth daily. 90 tablet 3   No current facility-administered medications for this encounter.    Allergies  Allergen Reactions  . Codeine Phosphate Other (See Comments)    Hyperactive       Social History   Socioeconomic History  . Marital status: Legally Separated    Spouse name: Not on file  . Number of children: 2  . Years of education: Not on file  . Highest education level: Not on file  Occupational History  . Not on file  Tobacco Use  . Smoking status: Former Smoker    Quit date: 07/23/1980    Years since quitting: 39.2  . Smokeless tobacco: Never Used  Substance and Sexual Activity  . Alcohol use: Yes    Alcohol/week: 1.0 standard drinks    Types: 1 Cans of beer per week    Comment: rare  . Drug use: No  . Sexual activity: Not Currently  Other Topics Concern  . Not on file  Social History Narrative   Pt lives alone. He has one son who lives within 3 miles who he usually sees daily. He has two sisters living in the next town over who he also sees on a weekly basis. He is a religious man who attends church at least 3 Sundays each month.   He operates [employer] full time.    Social Determinants of Health   Financial Resource Strain:   . Difficulty of Paying Living Expenses: Not on file  Food Insecurity:   . Worried About Charity fundraiser in the Last Year: Not on  file  . Ran Out of Food in the Last Year: Not on file  Transportation Needs:   . Lack of Transportation (Medical): Not on file  . Lack of Transportation (Non-Medical): Not on file  Physical Activity: Insufficiently Active  . Days of Exercise per Week: 2 days  . Minutes of Exercise per Session: 20 min  Stress: No Stress Concern Present  . Feeling of Stress : Not at all  Social Connections: Unknown  . Frequency of Communication with Friends and Family: Three times a  week  . Frequency of Social Gatherings with Friends and Family: More than three times a week  . Attends Religious Services: More than 4 times per year  . Active Member of Clubs or Organizations: No  . Attends Archivist Meetings: Never  . Marital Status: Patient refused  Intimate Partner Violence: Not At Risk  . Fear of Current or Ex-Partner: No  . Emotionally Abused: No  . Physically Abused: No  . Sexually Abused: No      Family History  Problem Relation Age of Onset  . Hypertension Other   . Diabetes Sister   . Hyperlipidemia Sister   . Sleep apnea Sister     Vitals:   09/29/19 0836  BP: 100/66  Pulse: 61  SpO2: 99%  Weight: (!) 136.8 kg (301 lb 9.6 oz)   PHYSICAL EXAM: General: NAD Neck: JVP 8 cm, no thyromegaly or thyroid nodule.  Lungs: Clear to auscultation bilaterally with normal respiratory effort. CV: Nondisplaced PMI.  Heart regular S1/S2, no S3/S4, 2/6 HSM apex.  1+ ankle edema.  No carotid bruit.  Normal pedal pulses.  Abdomen: Soft, nontender, no hepatosplenomegaly, no distention.  Skin: Intact without lesions or rashes.  Neurologic: Alert and oriented x 3.  Psych: Normal affect. Extremities: No clubbing or cyanosis.  HEENT: Normal.   ASSESSMENT & PLAN:  1. CAD: Cardiogenic shock in setting of atrial flutter with RVR in 03/2019.  Probable CTO mid LAD with extensive disease in other arteries. Patient taken urgently for CABG supported by Impella. 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  Kipp Brood (unable to graft LAD,  LIMA-D2, SVG-OM2, SVG-PDA).  No chest pain.  - Continue ASA 81.  - He has not been taking a statin, I am going to have him restart Crestor 10 mg daily with lipids/LFTs in 2 months.  2. Chronic systolic CHF: Echo with EF in 30% range with peri-apical severe akinesis in 9/20. Ischemic cardiomyopathy. Now s/p CABG. Echo today with EF a bit higher at 35-40%.  NYHA class II but not very mobile due to knee pain.  BP low during HD, limits use of cardiac meds.   - With EF up to 35-40%, would hold off on ICD (also given uncertain utility in dialysis patient).  - Continue bisoprolol 5 mg on non-HD days. - BP too low to add additional cardiomyopathy meds.  - He gets midodrine pre-HD.    3. Atrial fibrillation/flutter: NSR on EKG. No palpitations.  - I am going to stop amiodarone, can restart if afib recurs.  Check TSH and CMET today.   - On eliquis 5 mg twice a day. CBC today.  4. ESRD: HD MWF.  5. Aortic stenosis: Mild on today's echo.  6. Mitral regurgitation: Moderate to severe eccentric MR.  Not fully visualized.  I am concerned that this could affect the patient's ability to tolerate HD.   - I will arrange for TEE to look more closely at mitral valve.  If severe MR, may be a good candidate for Mitraclip.  I discussed risks/benefits of procedure with patient, he agrees.   Loralie Champagne, MD 09/29/19

## 2019-09-29 NOTE — Patient Instructions (Addendum)
STOP Amiodarone  STOP Lipitor  RESTART Crestor 10mg  daily  Labs today We will only contact you if something comes back abnormal or we need to make some changes. Otherwise no news is good news!  Dear Alex Williams,  You are scheduled for a TEE (Transesophageal Echocardiogram) on Thursday March 18th, 2021 with Dr. Aundra Dubin.  Please arrive at the [name] (Main Entrance A) at [facility]: [address] [place], [place] [address] at 9 am.   DIET: Nothing to eat or drink after midnight except a sip of water with medications (see medication instructions below)  Medication Instructions: Continue your anticoagulant: Eliquis and Aspirin  You will need to continue your anticoagulant after your procedure until you  are told by your  Provider that it is safe to stop  Take Bisoprolol with small sip of water  HOLD ALL OTHER MORNING MEDICATIONS   Labs:  COVID Screen:   You will need a pre procedure COVID test    WHEN:  Tuesday March 16th, 2021 at 11:00am WHERE: [facility]      [place] [address]  This is a drive thru testing site, you will remain in your car. Be sure to get in the line FOR PROCEDURES Once you have been swabbed you will need to remain home in quarantine until you return for your procedure.    You must have a responsible person to drive you home and stay in the waiting area during your procedure. Failure to do so could result in cancellation.  Bring your insurance cards.  *Special Note: Every effort is made to have your procedure done on time. Occasionally there are emergencies that occur at the hospital that may cause delays. Please be patient if a delay does occur.

## 2019-09-29 NOTE — H&P (View-Only) (Signed)
Advanced Heart Failure Clinic Note   Referring Physician: PCP: [name], MD HF Cardiology: Dr. Aundra Dubin  HPI:  69 y.o. with history of ESRD (started on HD in 2020), gout, HTN, hyperlipidemia was admitted to [facility] 04/16/19 for chest pain and palpitations.  On arrival to the ED, he was noted to be in atypical atrial flutter in the 130s-140s with acute anterior infarction. CODE STEMI activated.   He was brought to the cath lab and noted to be hypotensive with SBP in 80s. Norepinephrine was started and Impella was placed.  LHC and RHC were done showing mean RA 15, PA 68/32 mean 45, CI 3.3.  There was 90% distal left main stenosis into the LCx, totally occluded mid LAD after diagonal, 90% mild LCx, 50% ostial RCA, 99% ostial PDA.  POBA to mid LAD but unable to restore flow.   It was suspected that the LAD was a chronic occlusion and the onset of atypical aflutter with RVR drove chest pain and ischemia (via severe distal left main stenosis).  Hs-TnI returned at 1297. Echo showed EF 25% range with apical and peri-apical severe hypokinesis.   Patient was taken to the OR for urgent CABGx 3 by Dr. Kipp Brood. Unable to graft LAD, had LIMA-D2, SVG-OM2, and SVG-PDA. He required dobutamine and NE post op and was eventually weaned off and Impella removed. Afib/flutter treated w/ amiodarone and converted to NSR. Was placed on Eliquis for anticoagulation. Required CVVH for volume removal. Later transitioned to HD. Hypotension limited aggressive HF therapy. Was placed on midodrine to support BP during HD. Once stable, he was transferred to SNF for rehab. He is now back at home.   Echo was done today and reviewed, EF 35-40% with moderate-severe eccentric MR and mild AS.   He returns today for followup of CHF.  He is taking bisoprolol on non-HD days and taking midodrine pre-HD. Uses walker because of knee pain.  No chest pain.  He is driving again. No dyspnea walking on flat ground but does not go very far  because of knee pain.  BP still dropping with HD.  No orthopnea/PND.   ECG: NSR, lateral TWIs (personally reviewed)  Labs (11/20): LDL 60, HDL 31  PMH: 1. ESRD: MWF HD. 2. Gout 3. HTN 4. Hyperlipidemia 5. Anemia of renal disease.  6. Atypical atrial flutter: Noted 9/20.  7. Atrial fibrillation: Paroxysmal.  Noted after CABG in 9/20.  8. Chronic systolic CHF: Ischemic cardiomyopathy.   - Echo (9/20): EF 30%, akinesis of the distal 1/2-2/3 of LV.  - Echo (3/21): EF 35-40%, peri-apical akinesis, moderate-severe eccentric MR, mild AS with mean gradient 18 mmHg.  9. CAD: LHC in 9/20 showed 90% dLM, totally occluded mLAD (probably CTO), 90% mid LCx, 99% PDA.  Attempted POBA LAD but unable to restore flow.  - CABG (9/20): SVG-PDA, SVG-OM2, LIMA-D2.  No good LAD target.  10. Aortic stenosis: Mild on 3/21 echo.  11. Mitral regurgitation: Moderate-severe on 3/21 echo.   Review of Systems: All systems reviewed and negative except as per HPI.   Current Outpatient Medications  Medication Sig Dispense Refill  . allopurinol (ZYLOPRIM) 100 MG tablet Take 1 tablet (100 mg total) by mouth daily. 30 tablet 0  . aspirin EC 81 MG EC tablet Take 1 tablet (81 mg total) by mouth daily.    . bisoprolol (ZEBETA) 5 MG tablet Take 1 tablet as directed on non dialysis tues,thurs,sat,sun needs appt 90 tablet 0  . cinacalcet (SENSIPAR) 30 MG tablet Take  1 tablet (30 mg total) by mouth daily with supper. 60 tablet 0  . diclofenac Sodium (VOLTAREN) 1 % GEL Apply 2 g topically daily as needed (for knee).    Marland Kitchen ELIQUIS 5 MG TABS tablet TAKE 1 TABLET BY MOUTH TWICE A DAY 60 tablet 1  . ferric citrate (AURYXIA) 1 GM 210 MG(Fe) tablet Take 420 mg by mouth 3 (three) times daily with meals.     . fluticasone (FLONASE) 50 MCG/ACT nasal spray Place 1 spray into both nostrils daily as needed for allergies or rhinitis.    Marland Kitchen gabapentin (NEURONTIN) 100 MG capsule Take 100mg  by mouth at bedtime on HD days (M/W/F) 30 capsule 2  .  midodrine (PROAMATINE) 10 MG tablet Take 1 tablet (10 mg total) by mouth every Monday, Wednesday, and Friday with hemodialysis. 30 tablet 0  . multivitamin (RENA-VIT) TABS tablet Take 1 tablet by mouth at bedtime. 30 tablet 0  . pantoprazole (PROTONIX) 40 MG tablet Take 40 mg by mouth daily as needed.    . pregabalin (LYRICA) 100 MG capsule Take 1 capsule (100 mg total) by mouth at bedtime. 30 capsule 5  . traMADol (ULTRAM) 50 MG tablet Take 1 tablet (50 mg total) by mouth every 12 (twelve) hours as needed for moderate pain. 30 tablet 2  . rosuvastatin (CRESTOR) 10 MG tablet Take 1 tablet (10 mg total) by mouth daily. 90 tablet 3   No current facility-administered medications for this encounter.    Allergies  Allergen Reactions  . Codeine Phosphate Other (See Comments)    Hyperactive       Social History   Socioeconomic History  . Marital status: Legally Separated    Spouse name: Not on file  . Number of children: 2  . Years of education: Not on file  . Highest education level: Not on file  Occupational History  . Not on file  Tobacco Use  . Smoking status: Former Smoker    Quit date: 07/23/1980    Years since quitting: 39.2  . Smokeless tobacco: Never Used  Substance and Sexual Activity  . Alcohol use: Yes    Alcohol/week: 1.0 standard drinks    Types: 1 Cans of beer per week    Comment: rare  . Drug use: No  . Sexual activity: Not Currently  Other Topics Concern  . Not on file  Social History Narrative   Pt lives alone. He has one son who lives within 3 miles who he usually sees daily. He has two sisters living in the next town over who he also sees on a weekly basis. He is a religious man who attends church at least 3 Sundays each month.   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    Social Determinants of Health   Financial Resource Strain:   . Difficulty of Paying Living Expenses: Not on file  Food Insecurity:   . Worried About Charity fundraiser in the Last Year: Not on  file  . Ran Out of Food in the Last Year: Not on file  Transportation Needs:   . Lack of Transportation (Medical): Not on file  . Lack of Transportation (Non-Medical): Not on file  Physical Activity: Insufficiently Active  . Days of Exercise per Week: 2 days  . Minutes of Exercise per Session: 20 min  Stress: No Stress Concern Present  . Feeling of Stress : Not at all  Social Connections: Unknown  . Frequency of Communication with Friends and Family: Three times a  week  . Frequency of Social Gatherings with Friends and Family: More than three times a week  . Attends Religious Services: More than 4 times per year  . Active Member of Clubs or Organizations: No  . Attends Archivist Meetings: Never  . Marital Status: Patient refused  Intimate Partner Violence: Not At Risk  . Fear of Current or Ex-Partner: No  . Emotionally Abused: No  . Physically Abused: No  . Sexually Abused: No      Family History  Problem Relation Age of Onset  . Hypertension Other   . Diabetes Sister   . Hyperlipidemia Sister   . Sleep apnea Sister     Vitals:   09/29/19 0836  BP: 100/66  Pulse: 61  SpO2: 99%  Weight: (!) 136.8 kg (301 lb 9.6 oz)   PHYSICAL EXAM: General: NAD Neck: JVP 8 cm, no thyromegaly or thyroid nodule.  Lungs: Clear to auscultation bilaterally with normal respiratory effort. CV: Nondisplaced PMI.  Heart regular S1/S2, no S3/S4, 2/6 HSM apex.  1+ ankle edema.  No carotid bruit.  Normal pedal pulses.  Abdomen: Soft, nontender, no hepatosplenomegaly, no distention.  Skin: Intact without lesions or rashes.  Neurologic: Alert and oriented x 3.  Psych: Normal affect. Extremities: No clubbing or cyanosis.  HEENT: Normal.   ASSESSMENT & PLAN:  1. CAD: Cardiogenic shock in setting of atrial flutter with RVR in 03/2019.  Probable CTO mid LAD with extensive disease in other arteries. Patient taken urgently for CABG supported by Impella. 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  Kipp Brood (unable to graft LAD,  LIMA-D2, SVG-OM2, SVG-PDA).  No chest pain.  - Continue ASA 81.  - He has not been taking a statin, I am going to have him restart Crestor 10 mg daily with lipids/LFTs in 2 months.  2. Chronic systolic CHF: Echo with EF in 30% range with peri-apical severe akinesis in 9/20. Ischemic cardiomyopathy. Now s/p CABG. Echo today with EF a bit higher at 35-40%.  NYHA class II but not very mobile due to knee pain.  BP low during HD, limits use of cardiac meds.   - With EF up to 35-40%, would hold off on ICD (also given uncertain utility in dialysis patient).  - Continue bisoprolol 5 mg on non-HD days. - BP too low to add additional cardiomyopathy meds.  - He gets midodrine pre-HD.    3. Atrial fibrillation/flutter: NSR on EKG. No palpitations.  - I am going to stop amiodarone, can restart if afib recurs.  Check TSH and CMET today.   - On eliquis 5 mg twice a day. CBC today.  4. ESRD: HD MWF.  5. Aortic stenosis: Mild on today's echo.  6. Mitral regurgitation: Moderate to severe eccentric MR.  Not fully visualized.  I am concerned that this could affect the patient's ability to tolerate HD.   - I will arrange for TEE to look more closely at mitral valve.  If severe MR, may be a good candidate for Mitraclip.  I discussed risks/benefits of procedure with patient, he agrees.   Loralie Champagne, MD 09/29/19

## 2019-09-30 ENCOUNTER — Other Ambulatory Visit (HOSPITAL_COMMUNITY): Payer: Self-pay

## 2019-09-30 DIAGNOSIS — I340 Nonrheumatic mitral (valve) insufficiency: Secondary | ICD-10-CM

## 2019-10-03 ENCOUNTER — Other Ambulatory Visit (HOSPITAL_COMMUNITY): Payer: Medicare Other

## 2019-10-04 ENCOUNTER — Other Ambulatory Visit (HOSPITAL_COMMUNITY): Payer: Self-pay | Admitting: Cardiology

## 2019-10-06 ENCOUNTER — Other Ambulatory Visit (HOSPITAL_COMMUNITY)
Admission: RE | Admit: 2019-10-06 | Discharge: 2019-10-06 | Disposition: A | Discharge status: Home / Self Care | Payer: Medicare Other | Source: Ambulatory Visit | Attending: Cardiology | Admitting: Cardiology

## 2019-10-06 DIAGNOSIS — Z01812 Encounter for preprocedural laboratory examination: Secondary | ICD-10-CM | POA: Diagnosis present

## 2019-10-06 DIAGNOSIS — Z20822 Contact with and (suspected) exposure to covid-19: Secondary | ICD-10-CM | POA: Diagnosis not present

## 2019-10-06 LAB — SARS CORONAVIRUS 2 (TAT 6-24 HRS): SARS Coronavirus 2: NEGATIVE

## 2019-10-08 ENCOUNTER — Encounter (HOSPITAL_COMMUNITY)
Admission: RE | Disposition: A | Discharge status: Home / Self Care | Payer: Self-pay | Source: Home / Self Care | Attending: Cardiology

## 2019-10-08 ENCOUNTER — Ambulatory Visit (HOSPITAL_COMMUNITY)
Admission: RE | Admit: 2019-10-08 | Discharge: 2019-10-08 | Disposition: A | Discharge status: Home / Self Care | Payer: Medicare Other | Attending: Cardiology | Admitting: Cardiology

## 2019-10-08 ENCOUNTER — Other Ambulatory Visit (HOSPITAL_COMMUNITY): Payer: Self-pay | Admitting: *Deleted

## 2019-10-08 ENCOUNTER — Ambulatory Visit (HOSPITAL_BASED_OUTPATIENT_CLINIC_OR_DEPARTMENT_OTHER): Payer: Medicare Other

## 2019-10-08 ENCOUNTER — Ambulatory Visit (HOSPITAL_COMMUNITY): Payer: Medicare Other | Admitting: Certified Registered"

## 2019-10-08 ENCOUNTER — Encounter (HOSPITAL_COMMUNITY): Payer: Self-pay | Admitting: Cardiology

## 2019-10-08 ENCOUNTER — Other Ambulatory Visit: Payer: Self-pay

## 2019-10-08 DIAGNOSIS — I350 Nonrheumatic aortic (valve) stenosis: Secondary | ICD-10-CM | POA: Diagnosis not present

## 2019-10-08 DIAGNOSIS — I480 Paroxysmal atrial fibrillation: Secondary | ICD-10-CM | POA: Insufficient documentation

## 2019-10-08 DIAGNOSIS — Z7982 Long term (current) use of aspirin: Secondary | ICD-10-CM | POA: Diagnosis not present

## 2019-10-08 DIAGNOSIS — N186 End stage renal disease: Secondary | ICD-10-CM | POA: Diagnosis not present

## 2019-10-08 DIAGNOSIS — Z87891 Personal history of nicotine dependence: Secondary | ICD-10-CM | POA: Diagnosis not present

## 2019-10-08 DIAGNOSIS — Z885 Allergy status to narcotic agent status: Secondary | ICD-10-CM | POA: Insufficient documentation

## 2019-10-08 DIAGNOSIS — I340 Nonrheumatic mitral (valve) insufficiency: Secondary | ICD-10-CM

## 2019-10-08 DIAGNOSIS — I484 Atypical atrial flutter: Secondary | ICD-10-CM | POA: Insufficient documentation

## 2019-10-08 DIAGNOSIS — I5022 Chronic systolic (congestive) heart failure: Secondary | ICD-10-CM | POA: Insufficient documentation

## 2019-10-08 DIAGNOSIS — Z7901 Long term (current) use of anticoagulants: Secondary | ICD-10-CM | POA: Diagnosis not present

## 2019-10-08 DIAGNOSIS — I132 Hypertensive heart and chronic kidney disease with heart failure and with stage 5 chronic kidney disease, or end stage renal disease: Secondary | ICD-10-CM | POA: Diagnosis not present

## 2019-10-08 DIAGNOSIS — E785 Hyperlipidemia, unspecified: Secondary | ICD-10-CM | POA: Diagnosis not present

## 2019-10-08 DIAGNOSIS — Z79899 Other long term (current) drug therapy: Secondary | ICD-10-CM | POA: Insufficient documentation

## 2019-10-08 DIAGNOSIS — Z8249 Family history of ischemic heart disease and other diseases of the circulatory system: Secondary | ICD-10-CM | POA: Diagnosis not present

## 2019-10-08 DIAGNOSIS — M109 Gout, unspecified: Secondary | ICD-10-CM | POA: Diagnosis not present

## 2019-10-08 DIAGNOSIS — I420 Dilated cardiomyopathy: Secondary | ICD-10-CM | POA: Diagnosis not present

## 2019-10-08 DIAGNOSIS — I2510 Atherosclerotic heart disease of native coronary artery without angina pectoris: Secondary | ICD-10-CM | POA: Diagnosis not present

## 2019-10-08 DIAGNOSIS — I255 Ischemic cardiomyopathy: Secondary | ICD-10-CM | POA: Insufficient documentation

## 2019-10-08 DIAGNOSIS — Z951 Presence of aortocoronary bypass graft: Secondary | ICD-10-CM | POA: Diagnosis not present

## 2019-10-08 DIAGNOSIS — Z992 Dependence on renal dialysis: Secondary | ICD-10-CM | POA: Insufficient documentation

## 2019-10-08 DIAGNOSIS — I083 Combined rheumatic disorders of mitral, aortic and tricuspid valves: Secondary | ICD-10-CM | POA: Insufficient documentation

## 2019-10-08 HISTORY — PX: TEE WITHOUT CARDIOVERSION: SHX5443

## 2019-10-08 LAB — POCT I-STAT, CHEM 8
BUN: 29 mg/dL — ABNORMAL HIGH (ref 8–23)
Calcium, Ion: 1.11 mmol/L — ABNORMAL LOW (ref 1.15–1.40)
Chloride: 95 mmol/L — ABNORMAL LOW (ref 98–111)
Creatinine, Ser: 7 mg/dL — ABNORMAL HIGH (ref 0.61–1.24)
Glucose, Bld: 79 mg/dL (ref 70–99)
HCT: 27 % — ABNORMAL LOW (ref 39.0–52.0)
Hemoglobin: 9.2 g/dL — ABNORMAL LOW (ref 13.0–17.0)
Potassium: 4.3 mmol/L (ref 3.5–5.1)
Sodium: 136 mmol/L (ref 135–145)
TCO2: 35 mmol/L — ABNORMAL HIGH (ref 22–32)

## 2019-10-08 SURGERY — ECHOCARDIOGRAM, TRANSESOPHAGEAL
Anesthesia: Monitor Anesthesia Care

## 2019-10-08 MED ORDER — SODIUM CHLORIDE 0.9 % IV SOLN: INTRAVENOUS | Status: DC

## 2019-10-08 MED ORDER — PROPOFOL 500 MG/50ML IV EMUL
INTRAVENOUS | Status: DC | PRN
  Administered 2019-10-08: 100 ug/kg/min via INTRAVENOUS

## 2019-10-08 MED ORDER — LIDOCAINE 2% (20 MG/ML) 5 ML SYRINGE
INTRAMUSCULAR | Status: DC | PRN
  Administered 2019-10-08: 60 mg via INTRAVENOUS

## 2019-10-08 NOTE — Transfer of Care (Signed)
Immediate Anesthesia Transfer of Care Note  Patient: Alex Williams  Procedure(s) Performed: TRANSESOPHAGEAL ECHOCARDIOGRAM (TEE) (N/A )  Patient Location: Endoscopy Unit  Anesthesia Type:MAC  Level of Consciousness: awake and oriented  Airway & Oxygen Therapy: Patient Spontanous Breathing and Patient connected to nasal cannula oxygen  Post-op Assessment: Report given to RN  Post vital signs: Reviewed and stable  Last Vitals:  Vitals Value Taken Time  BP 104/37 10/08/19 1124  Temp    Pulse 56 10/08/19 1125  Resp 15 10/08/19 1125  SpO2 100 % 10/08/19 1125  Vitals shown include unvalidated device data.  Last Pain:  Vitals:   10/08/19 0924  TempSrc: Oral  PainSc: 0-No pain         Complications: No apparent anesthesia complications

## 2019-10-08 NOTE — Discharge Instructions (Signed)
Electrical Cardioversion Electrical cardioversion is the delivery of a jolt of electricity to restore a normal rhythm to the heart. A rhythm that is too fast or is not regular keeps the heart from pumping well. In this procedure, sticky patches or metal paddles are placed on the chest to deliver electricity to the heart from a device. This procedure may be done in an emergency if:  There is low or no blood pressure as a result of the heart rhythm.  Normal rhythm must be restored as fast as possible to protect the brain and heart from further damage.  It may save a life. This may also be a scheduled procedure for irregular or fast heart rhythms that are not immediately life-threatening. Tell a health care provider about:  Any allergies you have.  All medicines you are taking, including vitamins, herbs, eye drops, creams, and over-the-counter medicines.  Any problems you or family members have had with anesthetic medicines.  Any blood disorders you have.  Any surgeries you have had.  Any medical conditions you have.  Whether you are pregnant or may be pregnant. What are the risks? Generally, this is a safe procedure. However, problems may occur, including:  Allergic reactions to medicines.  A blood clot that breaks free and travels to other parts of your body.  The possible return of an abnormal heart rhythm within hours or days after the procedure.  Your heart stopping (cardiac arrest). This is rare. What happens before the procedure? Medicines  Your health care provider may have you start taking: ? Blood-thinning medicines (anticoagulants) so your blood does not clot as easily. ? Medicines to help stabilize your heart rate and rhythm.  Ask your health care provider about: ? Changing or stopping your regular medicines. This is especially important if you are taking diabetes medicines or blood thinners. ? Taking medicines such as aspirin and ibuprofen. These medicines can  thin your blood. Do not take these medicines unless your health care provider tells you to take them. ? Taking over-the-counter medicines, vitamins, herbs, and supplements. General instructions  Follow instructions from your health care provider about eating or drinking restrictions.  Plan to have someone take you home from the hospital or clinic.  If you will be going home right after the procedure, plan to have someone with you for 24 hours.  Ask your health care provider what steps will be taken to help prevent infection. These may include washing your skin with a germ-killing soap. What happens during the procedure?   An IV will be inserted into one of your veins.  Sticky patches (electrodes) or metal paddles may be placed on your chest.  You will be given a medicine to help you relax (sedative).  An electrical shock will be delivered. The procedure may vary among health care providers and hospitals. What can I expect after the procedure?  Your blood pressure, heart rate, breathing rate, and blood oxygen level will be monitored until you leave the hospital or clinic.  Your heart rhythm will be watched to make sure it does not change.  You may have some redness on the skin where the shocks were given. Follow these instructions at home:  Do not drive for 24 hours if you were given a sedative during your procedure.  Take over-the-counter and prescription medicines only as told by your health care provider.  Ask your health care provider how to check your pulse. Check it often.  Rest for 48 hours after the procedure or   as told by your health care provider.  Avoid or limit your caffeine use as told by your health care provider.  Keep all follow-up visits as told by your health care provider. This is important. Contact a health care provider if:  You feel like your heart is beating too quickly or your pulse is not regular.  You have a serious muscle cramp that does not go  away. Get help right away if:  You have discomfort in your chest.  You are dizzy or you feel faint.  You have trouble breathing or you are short of breath.  Your speech is slurred.  You have trouble moving an arm or leg on one side of your body.  Your fingers or toes turn cold or blue. Summary  Electrical cardioversion is the delivery of a jolt of electricity to restore a normal rhythm to the heart.  This procedure may be done right away in an emergency or may be a scheduled procedure if the condition is not an emergency.  Generally, this is a safe procedure.  After the procedure, check your pulse often as told by your health care provider. This information is not intended to replace advice given to you by your health care provider. Make sure you discuss any questions you have with your health care provider. Document Revised: 02/09/2019 Document Reviewed: 02/09/2019 Elsevier Patient Education  2020 [employer]  

## 2019-10-08 NOTE — CV Procedure (Signed)
Procedure: TEE  Indication: Mitral regurgitation.   Sedation: Per anesthesiology  Findings:  Please see echo section for full report.  Mildly dilated LV with EF 35%, peri-apical akinesis.  Normal RV size with mildly decreased systolic function.  Mild left atrial enlargement, no LA appendage thrombus.  Normal right atrium.  No ASD/PFO by color doppler.  There was mild TR, peak RV-RA gradient 38 mmHg.  The mitral valve was calcified with restriction of the posterior leaflet.  There was moderate mitral regurgitation.  There was flattening of the pulmonary vein systolic doppler signal but no reversal.  By PISA, ERO 0.14 cm^2 (suggests mild MR but I think it is moderate). No mitral stenosis, mean gradient 2 mmHg.  The aortic valve was moderately calcified and trileaflet.  Mild AS with mean gradient 18 mmHg and AVA 1.7 cm^2.  Normal caliber aorta with grade 3 plaque. Small pericardial effusion.   The mitral regurgitation appears moderate.  Alex Williams 10/08/2019 11:21 AM

## 2019-10-08 NOTE — Progress Notes (Signed)
  Echocardiogram Echocardiogram Transesophageal has been performed.  Alex Williams 10/08/2019, 11:26 AM

## 2019-10-08 NOTE — Anesthesia Postprocedure Evaluation (Signed)
Anesthesia Post Note  Patient: Alex Williams  Procedure(s) Performed: TRANSESOPHAGEAL ECHOCARDIOGRAM (TEE) (N/A )     Patient location during evaluation: Endoscopy Anesthesia Type: MAC Level of consciousness: awake and alert Pain management: pain level controlled Vital Signs Assessment: post-procedure vital signs reviewed and stable Respiratory status: spontaneous breathing, nonlabored ventilation, respiratory function stable and patient connected to nasal cannula oxygen Cardiovascular status: blood pressure returned to baseline and stable Postop Assessment: no apparent nausea or vomiting Anesthetic complications: no    Last Vitals:  Vitals:   10/08/19 1145 10/08/19 1150  BP: 132/67 (!) 139/58  Pulse: (!) 59 (!) 59  Resp: 16 15  Temp:    SpO2: 100% 99%    Last Pain:  Vitals:   10/08/19 1150  TempSrc:   PainSc: 0-No pain                 Naheim Burgen L Shaylen Nephew

## 2019-10-08 NOTE — Interval H&P Note (Signed)
History and Physical Interval Note:  10/08/2019 10:56 AM  Alex Williams  has presented today for surgery, with the diagnosis of MITRAL REGURGITATION.  The various methods of treatment have been discussed with the patient and family. After consideration of risks, benefits and other options for treatment, the patient has consented to  Procedure(s): TRANSESOPHAGEAL ECHOCARDIOGRAM (TEE) (N/A) as a surgical intervention.  The patient's history has been reviewed, patient examined, no change in status, stable for surgery.  I have reviewed the patient's chart and labs.  Questions were answered to the patient's satisfaction.     Mckaylie Vasey [employer]

## 2019-10-08 NOTE — Anesthesia Preprocedure Evaluation (Addendum)
Anesthesia Evaluation  Patient identified by MRN, date of birth, ID band Patient awake    Reviewed: Allergy & Precautions, NPO status , Patient's Chart, lab work & pertinent test results, reviewed documented beta blocker date and time   Airway Mallampati: III  TM Distance: >3 FB Neck ROM: Full    Dental no notable dental hx. (+) Poor Dentition, Missing, Chipped, Dental Advisory Given,    Pulmonary sleep apnea , former smoker,    Pulmonary exam normal breath sounds clear to auscultation       Cardiovascular hypertension, Pt. on home beta blockers and Pt. on medications + CAD, + Past MI and + CABG (03/2019)  Normal cardiovascular exam+ dysrhythmias Atrial Fibrillation + Valvular Problems/Murmurs MR and AS  Rhythm:Regular Rate:Normal  TTE 09/2019 1. Compared to previous echo MR is more severe.  2. LVEF is moderately depressed Proximal poritons of LV are vigorous but distal anterior wall is hypokinetic and distal inferior, distal inferoseplal, distal lateral, apical are akinetic . Left ventricular ejection fraction, by estimation, is 35 to 40%. The left ventricle has moderately decreased function. The left ventricle demonstrates regional wall motion abnormalities (see scoring diagram/findings for description). The left ventricular internal cavity  size was severely dilated. There is mild left ventricular hypertrophy. Left ventricular diastolic parameters are consistent with Grade III diastolic dysfunction (restrictive). Elevated left atrial pressure.  3. Right ventricular systolic function is normal. The right ventricular size is normal.  4. Left atrial size was mildly dilated.  5. MR is eccentric, directed posteriorly into LA. The mitral valve is abnormal. Moderate to severe mitral valve regurgitation.  6. Tricuspid valve regurgitation is mild to moderate.  7. The AV is thickened, calcified. Peak and mean gradients through the valve are  27 and 18 mm Hg respectively. THis is increased from previous  echo. LVOT/AV VTI ratio is 0.48 consistent with mild to moderate AS.Marland Kitchen The aortic valve is abnormal. Aortic valve regurgitation is not visualized.   LHC 03/2019 Ost RCA to Prox RCA lesion is 50% stenosed. RPDA lesion is 99% stenosed. Ost Cx to Prox Cx lesion is 90% stenosed. 1st Mrg lesion is 80% stenosed. Mid LAD lesion is 100% stenosed. Ost LAD to Mid LAD lesion is 30% stenosed. Balloon angioplasty was performed using a BALLOON TREK OTW 2.5X12. Post intervention, there is a 100% residual stenosis. Mid Cx to Dist Cx lesion is 99% stenosed.   1. Acute anterior STEMI with critical distal left main stenosis involving the Circumflex and LAD. Chronic occlusion of the mid LAD. Severe stenosis PDA. Ischemic cascade this am likely driven by atrial flutter with RVR.  2. Cardiogenic shock 3. Successful placement of Impella CP for hemodynamic support 4. Balloon angioplasty of the mid LAD. Unable to restore flow down the LAD as this is felt to be a chronic occlusion.   Recommendations: He will be taken to the OR for emergent CABG. The Impella will remain in place for now.   Neuro/Psych negative neurological ROS  negative psych ROS   GI/Hepatic Neg liver ROS, GERD  Medicated,  Endo/Other  Morbid obesity (BMI 41)  Renal/GU ESRF and DialysisRenal disease  negative genitourinary   Musculoskeletal  (+) Arthritis ,   Abdominal   Peds  Hematology  (+) Blood dyscrasia (on eliquis, Hgb 8.3), anemia ,   Anesthesia Other Findings TEE for MR  Reproductive/Obstetrics  Anesthesia Physical Anesthesia Plan  ASA: IV  Anesthesia Plan: MAC   Post-op Pain Management:    Induction: Intravenous  PONV Risk Score and Plan: 1 and Propofol infusion and Treatment may vary due to age or medical condition  Airway Management Planned: Natural Airway  Additional Equipment:    Intra-op Plan:   Post-operative Plan:   Informed Consent: I have reviewed the patients History and Physical, chart, labs and discussed the procedure including the risks, benefits and alternatives for the proposed anesthesia with the patient or authorized representative who has indicated his/her understanding and acceptance.     Dental advisory given  Plan Discussed with: CRNA  Anesthesia Plan Comments:         Anesthesia Quick Evaluation

## 2019-10-20 ENCOUNTER — Encounter: Payer: Self-pay | Admitting: Family Medicine

## 2019-10-20 ENCOUNTER — Telehealth (INDEPENDENT_AMBULATORY_CARE_PROVIDER_SITE_OTHER): Payer: Medicare Other | Admitting: Family Medicine

## 2019-10-20 ENCOUNTER — Other Ambulatory Visit: Payer: Self-pay

## 2019-10-20 DIAGNOSIS — G8929 Other chronic pain: Secondary | ICD-10-CM

## 2019-10-20 DIAGNOSIS — R110 Nausea: Secondary | ICD-10-CM

## 2019-10-20 DIAGNOSIS — M25561 Pain in right knee: Secondary | ICD-10-CM

## 2019-10-20 MED ORDER — ONDANSETRON 8 MG PO TBDP: 8.0000 mg | ORAL_TABLET | Freq: Three times a day (TID) | ORAL | 5 refills | Status: DC | PRN

## 2019-10-20 NOTE — Progress Notes (Signed)
Virtual Visit Note  I connected with patient on 10/20/19 at 542pm by video via epic and verified that I am speaking with the correct person using two identifiers. Alex Williams is currently located at home and patient is currently with them during visit. The provider, [name], MD is located in their office at time of visit.  I discussed the limitations, risks, security and privacy concerns of performing an evaluation and management service by telephone and the availability of in person appointments. I also discussed with the patient that there may be a patient responsible charge related to this service. The patient expressed understanding and agreed to proceed.   I provided 15 minutes of non-face-to-face time during this encounter.  Chief Complaint  Patient presents with  . Nausea    pt is asking for medication for nausea for after his dialysis. He gets dialysis 3 times a week    HPI ? Patient has been experiencing persistent nausea wo vomiting after each HD session for about the past month It starts in the afternoon and lingers into 2-3 in the morning He is not able to eat or sleep well He did mention it at HD clinic and was told to eat peppermint which is not helping He denies any abd pain, changes in BM, reflux He has no problems on non-dialysis days  He is also having more problems with his right knee, has known OA It tends to give and overall make his leg feel weaker He is becoming fearful to walk outside his home by himself He would like to have this evaluated further as he is trying to become more active and lose weight    Allergies  Allergen Reactions  . Codeine Phosphate Other (See Comments)    Hyperactive     Prior to Admission medications   Medication Sig Start Date End Date Taking? Authorizing Provider  allopurinol (ZYLOPRIM) 100 MG tablet Take 1 tablet (100 mg total) by mouth daily. 05/08/19  Yes Angiulli, Lavon Paganini, PA-C  aspirin EC 81 MG EC tablet  Take 1 tablet (81 mg total) by mouth daily. 05/01/19  Yes Harriet Pho, Tessa N, PA-C  bisoprolol (ZEBETA) 5 MG tablet Take 1 tablet as directed on non dialysis tues,thurs,sat,sun needs appt 09/04/19  Yes Larey Dresser, MD  cinacalcet (SENSIPAR) 30 MG tablet Take 1 tablet (30 mg total) by mouth daily with supper. 05/22/19  Yes Lightfoot, Lucile Crater, MD  diclofenac Sodium (VOLTAREN) 1 % GEL Apply 2 g topically daily as needed (for knee).   Yes [provider]  ELIQUIS 5 MG TABS tablet TAKE 1 TABLET BY MOUTH TWICE A DAY 08/20/19  Yes Larey Dresser, MD  ferric citrate (AURYXIA) 1 GM 210 MG(Fe) tablet Take 420 mg by mouth 3 (three) times daily with meals.    Yes [provider]  fluticasone (FLONASE) 50 MCG/ACT nasal spray Place 1 spray into both nostrils daily as needed for allergies or rhinitis.   Yes [provider]  gabapentin (NEURONTIN) 100 MG capsule Take 100mg  by mouth at bedtime on HD days (M/W/F) 06/04/19  Yes [name], MD  midodrine (PROAMATINE) 10 MG tablet Take 1 tablet (10 mg total) by mouth every Monday, Wednesday, and Friday with hemodialysis. 05/08/19  Yes Angiulli, Lavon Paganini, PA-C  multivitamin (RENA-VIT) TABS tablet Take 1 tablet by mouth at bedtime. 05/07/19  Yes Angiulli, Lavon Paganini, PA-C  pantoprazole (PROTONIX) 40 MG tablet Take 40 mg by mouth daily as needed.  Yes [provider]  pregabalin (LYRICA) 100 MG capsule Take 1 capsule (100 mg total) by mouth at bedtime. 06/26/19 06/25/20 Yes Lovorn, Jinny Blossom, MD  rosuvastatin (CRESTOR) 10 MG tablet Take 1 tablet (10 mg total) by mouth daily. 09/29/19 12/28/19 Yes Larey Dresser, MD  traMADol (ULTRAM) 50 MG tablet Take 1 tablet (50 mg total) by mouth every 12 (twelve) hours as needed for moderate pain. 09/03/19  Yes [name], MD    Past Medical History:  Diagnosis Date  . Cervical disc disease   . Chronic kidney disease   . COLONIC POLYPS, HX OF 06/27/2007  . EXOGENOUS OBESITY 01/30/2010  . GERD  (gastroesophageal reflux disease)    PMH  . GOUT 01/30/2010  . HYPERCHOLESTEROLEMIA 06/30/2007  . HYPERTENSION 06/27/2007  . LOW BACK PAIN 06/27/2007  . NEPHROLITHIASIS, HX OF 06/27/2007  . OSTEOARTHRITIS 06/27/2007  . SLEEP APNEA, OBSTRUCTIVE, MODERATE 01/27/2009  . Wears dentures    upper    Past Surgical History:  Procedure Laterality Date  . AV FISTULA PLACEMENT Right 01/19/2019   Procedure: RIGHT BRACHIOCEPHALIC ARTERIOVENOUS (AV) FISTULA CREATION;  Surgeon: Angelia Mould, MD;  Location: [facility];  Service: Vascular;  Laterality: Right;  . CARDIAC CATHETERIZATION    . CARPAL TUNNEL RELEASE     left hand  . COLONOSCOPY     with polypectomy  . CORONARY ARTERY BYPASS GRAFT N/A 04/16/2019   Procedure: CORONARY ARTERY BYPASS GRAFTING (CABG)X3  , WITH ENDOSCOPIC HARVESTING OF RIGHT GREATER SAPHENOUS VEIN;  Surgeon: Lajuana Matte, MD;  Location: [facility];  Service: Open Heart Surgery;  Laterality: N/A;  . CORONARY/GRAFT ACUTE MI REVASCULARIZATION N/A 04/16/2019   Procedure: CORONARY/GRAFT ACUTE MI REVASCULARIZATION;  Surgeon: Burnell Blanks, MD;  Location: [facility];  Service: Cardiovascular;  Laterality: N/A;  . INSERTION OF DIALYSIS CATHETER N/A 04/29/2019   Procedure: INSERTION OF TUNNELED DIALYSIS CATHETER, right internal jugular;  Surgeon: Rosetta Posner, MD;  Location: [facility];  Service: Vascular;  Laterality: N/A;  . JOINT REPLACEMENT Left 2005   knee  . LUMBAR LAMINECTOMY    . MULTIPLE TOOTH EXTRACTIONS    . PERIPHERAL VASCULAR BALLOON ANGIOPLASTY Right 06/25/2019   Procedure: PERIPHERAL VASCULAR BALLOON ANGIOPLASTY;  Surgeon: Marty Heck, MD;  Location: [facility];  Service: Cardiovascular;  Laterality: Right;  . RADIOFREQUENCY ABLATION KIDNEY    . RIGHT/LEFT HEART CATH AND CORONARY ANGIOGRAPHY N/A 04/16/2019   Procedure: RIGHT/LEFT HEART CATH AND CORONARY ANGIOGRAPHY;  Surgeon: Burnell Blanks, MD;  Location: [facility];   Service: Cardiovascular;  Laterality: N/A;  . TEE WITHOUT CARDIOVERSION N/A 04/16/2019   Procedure: TRANSESOPHAGEAL ECHOCARDIOGRAM (TEE);  Surgeon: Lajuana Matte, MD;  Location: [facility];  Service: Open Heart Surgery;  Laterality: N/A;  . TEE WITHOUT CARDIOVERSION N/A 10/08/2019   Procedure: TRANSESOPHAGEAL ECHOCARDIOGRAM (TEE);  Surgeon: Larey Dresser, MD;  Location: [facility] ENDOSCOPY;  Service: Cardiovascular;  Laterality: N/A;  . TOTAL KNEE ARTHROPLASTY     left  . VENTRICULAR ASSIST DEVICE INSERTION N/A 04/16/2019   Procedure: VENTRICULAR ASSIST DEVICE INSERTION;  Surgeon: Burnell Blanks, MD;  Location: [place] [facility];  Service: Cardiovascular;  Laterality: N/A;    Social History   Tobacco Use  . Smoking status: Former Smoker    Quit date: 07/23/1980    Years since quitting: 39.2  . Smokeless tobacco: Never Used  Substance Use Topics  . Alcohol use: Yes    Alcohol/week: 1.0 standard drinks  Types: 1 Cans of beer per week    Comment: rare    Family History  Problem Relation Age of Onset  . Hypertension Other   . Diabetes Sister   . Hyperlipidemia Sister   . Sleep apnea Sister     ROS Per hpi  Objective  Vitals as reported by the patient: none  GEN: AAOx3, NAD HEENT: [place]/AT, pupils are symmetrical, EOMI, non-icteric sclera Resp: breathing comfortably, speaking in full sentences Skin: no rashes noted, no pallor Psych: good eye contact, normal mood and affect   ASSESSMENT and PLAN  1. Nausea without vomiting Nausea post HD that is significantly impacting quality of life, has tried supportive measures wo benefits. rx for zofran given, safe in HD and age.   2. Chronic pain of right knee - amb ref orthopedics  Other orders - ondansetron (ZOFRAN-ODT) 8 MG disintegrating tablet; Take 1 tablet (8 mg total) by mouth every 8 (eight) hours as needed for nausea or vomiting.  FOLLOW-UP: prn   The above assessment and management plan was discussed with the  patient. The patient verbalized understanding of and has agreed to the management plan. Patient is aware to call the clinic if symptoms persist or worsen. Patient is aware when to return to the clinic for a follow-up visit. Patient educated on when it is appropriate to go to the emergency department.     [name], MD Primary [facility] at [facility], [place] [address] Ph.  [phone] Fax [phone]

## 2019-10-20 NOTE — Patient Instructions (Signed)
° ° ° °  If you have lab work done today you will be contacted with your lab results within the next 2 weeks.  If you have not heard from us then please contact us. The fastest way to get your results is to register for My Chart. ° ° °IF you received an x-ray today, you will receive an invoice from [facility]. Please contact [facility] at [phone] with questions or concerns regarding your invoice.  ° °IF you received labwork today, you will receive an invoice from [facility]. Please contact [facility] at [phone] with questions or concerns regarding your invoice.  ° °Our billing staff will not be able to assist you with questions regarding bills from these companies. ° °You will be contacted with the lab results as soon as they are available. The fastest way to get your results is to activate your My Chart account. Instructions are located on the last page of this paperwork. If you have not heard from us regarding the results in 2 weeks, please contact this office. °  ° ° ° °

## 2019-10-21 ENCOUNTER — Other Ambulatory Visit (HOSPITAL_COMMUNITY): Payer: Self-pay | Admitting: Cardiology

## 2019-10-29 ENCOUNTER — Ambulatory Visit: Payer: Self-pay

## 2019-10-29 ENCOUNTER — Other Ambulatory Visit: Payer: Self-pay

## 2019-10-29 ENCOUNTER — Encounter: Payer: Self-pay | Admitting: Orthopaedic Surgery

## 2019-10-29 ENCOUNTER — Telehealth: Payer: Self-pay

## 2019-10-29 ENCOUNTER — Ambulatory Visit: Payer: Medicare Other | Admitting: Orthopaedic Surgery

## 2019-10-29 DIAGNOSIS — M1711 Unilateral primary osteoarthritis, right knee: Secondary | ICD-10-CM | POA: Diagnosis not present

## 2019-10-29 DIAGNOSIS — Z6841 Body Mass Index (BMI) 40.0 and over, adult: Secondary | ICD-10-CM

## 2019-10-29 MED ORDER — BUPIVACAINE HCL 0.5 % IJ SOLN
2.0000 mL | INTRAMUSCULAR | Status: AC | PRN
  Administered 2019-10-29: 2 mL via INTRA_ARTICULAR

## 2019-10-29 MED ORDER — METHYLPREDNISOLONE ACETATE 40 MG/ML IJ SUSP
40.0000 mg | INTRAMUSCULAR | Status: AC | PRN
  Administered 2019-10-29: 40 mg via INTRA_ARTICULAR

## 2019-10-29 MED ORDER — LIDOCAINE HCL 1 % IJ SOLN
2.0000 mL | INTRAMUSCULAR | Status: AC | PRN
  Administered 2019-10-29: 10:00:00 2 mL

## 2019-10-29 NOTE — Progress Notes (Signed)
Office Visit Note   Patient: Alex Williams           Date of Birth: Mar 30, 1951           MRN: [id number] Visit Date: 10/29/2019              Requested by: [name], MD [address] [place],  [place] [address] PCP: [name], MD   Assessment & Plan: Visit Diagnoses:  1. Primary osteoarthritis of right knee   2. Body mass index 40.0-44.9, adult ([place])   3. Morbid obesity ([place])     Plan: My impression is end-stage right knee DJD.  Patient is also morbidly obese with BMI greater than 40.  We talked about nonsurgical treatment options including cortisone injection, weight loss, Voltaren gel.  He will also continue take Tylenol as needed.  He tolerated the cortisone injection well today.  Follow-up as needed.  Follow-Up Instructions: Return if symptoms worsen or fail to improve.   Orders:  Orders Placed This Encounter  Procedures  . XR KNEE 3 VIEW RIGHT   No orders of the defined types were placed in this encounter.     Procedures: Large Joint Inj: R knee on 10/29/2019 9:52 AM Indications: pain Details: 22 G needle  Arthrogram: No  Medications: 40 mg methylPREDNISolone acetate 40 MG/ML; 2 mL lidocaine 1 %; 2 mL bupivacaine 0.5 % Consent was given by the patient. Patient was prepped and draped in the usual sterile fashion.       Clinical Data: No additional findings.   Subjective: Chief Complaint  Patient presents with  . Right Knee - Pain    Alex Williams is a 69 year old gentleman who comes in for evaluation of right knee pain due to DJD.  He is status post left total knee replacement many years ago by Dr. Shellia Carwin.  He currently walks with a walker due to recent MI in September 2020.  He is also on hemodialysis.  He ambulates at a slow pace with severe pain.  He takes Tylenol as needed.  Tramadol does not really help.  Denies any numbness and tingling.   Review of Systems  Constitutional: Negative.   All other systems reviewed and are negative.     Objective: Vital Signs: There were no vitals taken for this visit.  Physical Exam Vitals and nursing note reviewed.  Constitutional:      Appearance: He is well-developed.  HENT:     Head: Normocephalic and atraumatic.  Eyes:     Pupils: Pupils are equal, round, and reactive to light.  Pulmonary:     Effort: Pulmonary effort is normal.  Abdominal:     Palpations: Abdomen is soft.  Musculoskeletal:        General: Normal range of motion.     Cervical back: Neck supple.  Skin:    General: Skin is warm.  Neurological:     Mental Status: He is alert and oriented to person, place, and time.  Psychiatric:        Behavior: Behavior normal.        Thought Content: Thought content normal.        Judgment: Judgment normal.     Ortho Exam Right knee shows a trace joint effusion.  Slight limitation range of motion with mild pain.  Concentration is stable.  Patellofemoral crepitus. Specialty Comments:  No specialty comments available.  Imaging: XR KNEE 3 VIEW RIGHT  Result Date: 10/29/2019 Advanced tricompartment degenerative joint disease.  Stable uncomplicated left  total knee replacement    PMFS History: Patient Active Problem List   Diagnosis Date Noted  . Primary osteoarthritis of right knee 10/29/2019  . Body mass index 40.0-44.9, adult ([place]) 10/29/2019  . Morbid obesity ([place]) 10/29/2019  . 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 Lesion of left ulnar nerve 06/26/2019  . Recurrent knee pain 05/01/2019  . 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 Debility 04/30/2019  . ESRD on dialysis ([place])   . Hemodialysis-associated hypotension   . Atrial fibrillation with rapid ventricular response ([place])   . Anemia due to end stage renal disease ([place])   . 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 Coronary artery disease 04/16/2019  . Acute ST elevation myocardial infarction (STEMI) involving left anterior descending (LAD) coronary artery ([place])   .  Cardiogenic shock ([place])   . Acute ST elevation myocardial infarction (STEMI) of anterior wall ([place])   . Acute on chronic renal failure ([place]) 08/26/2018  . Nonspecific abnormal electrocardiogram (ECG) (EKG) 08/25/2018  . Anemia in CKD (chronic kidney disease) 08/25/2018  . Elevated troponin 08/25/2018  . SOB (shortness of breath) 08/25/2018  . Acute pulmonary edema (H[facility])   . Acute renal failure superimposed on stage 5 chronic kidney disease, not on chronic dialysis ([place]) 02/13/2018  . Morbid obesity with BMI of 45.0-49.9, adult ([place]) 12/03/2013  . Renal mass 12/02/2013  . Lower extremity edema 03/16/2013  . CKD (chronic kidney disease), stage III 12/10/2011  . GOUT 01/30/2010  . Obstructive sleep apnea 01/27/2009  . HYPERCHOLESTEROLEMIA 06/30/2007  . Essential hypertension 06/27/2007  . Osteoarthritis 06/27/2007  . LOW BACK PAIN 06/27/2007  . History of colonic polyps 06/27/2007  . NEPHROLITHIASIS, HX OF 06/27/2007   Past Medical History:  Diagnosis Date  . Cervical disc disease   . Chronic kidney disease   . COLONIC POLYPS, HX OF 06/27/2007  . EXOGENOUS OBESITY 01/30/2010  . GERD (gastroesophageal reflux disease)    PMH  . GOUT 01/30/2010  . HYPERCHOLESTEROLEMIA 06/30/2007  . HYPERTENSION 06/27/2007  . LOW BACK PAIN 06/27/2007  . NEPHROLITHIASIS, HX OF 06/27/2007  . OSTEOARTHRITIS 06/27/2007  . SLEEP APNEA, OBSTRUCTIVE, MODERATE 01/27/2009  . Wears dentures    upper    Family History  Problem Relation Age of Onset  . Hypertension Other   . Diabetes Sister   . Hyperlipidemia Sister   . Sleep apnea Sister     Past Surgical History:  Procedure Laterality Date  . AV FISTULA PLACEMENT Right 01/19/2019   Procedure: RIGHT BRACHIOCEPHALIC ARTERIOVENOUS (AV) FISTULA CREATION;  Surgeon: Angelia Mould, MD;  Location: [facility];  Service: Vascular;  Laterality: Right;  . CARDIAC CATHETERIZATION    . CARPAL TUNNEL RELEASE     left hand  . COLONOSCOPY     with polypectomy  .  CORONARY ARTERY BYPASS GRAFT N/A 04/16/2019   Procedure: CORONARY ARTERY BYPASS GRAFTING (CABG)X3  , WITH ENDOSCOPIC HARVESTING OF RIGHT GREATER SAPHENOUS VEIN;  Surgeon: Lajuana Matte, MD;  Location: [facility];  Service: Open Heart Surgery;  Laterality: N/A;  . CORONARY/GRAFT ACUTE MI REVASCULARIZATION N/A 04/16/2019   Procedure: CORONARY/GRAFT ACUTE MI REVASCULARIZATION;  Surgeon: Burnell Blanks, MD;  Location: [facility];  Service: Cardiovascular;  Laterality: N/A;  . INSERTION OF DIALYSIS CATHETER N/A 04/29/2019   Procedure: INSERTION OF TUNNELED DIALYSIS CATHETER, right internal jugular;  Surgeon: Rosetta Posner, MD;  Location: [facility];  Service: Vascular;  Laterality: N/A;  . JOINT  REPLACEMENT Left 2005   knee  . LUMBAR LAMINECTOMY    . MULTIPLE TOOTH EXTRACTIONS    . PERIPHERAL VASCULAR BALLOON ANGIOPLASTY Right 06/25/2019   Procedure: PERIPHERAL VASCULAR BALLOON ANGIOPLASTY;  Surgeon: Marty Heck, MD;  Location: [facility];  Service: Cardiovascular;  Laterality: Right;  . RADIOFREQUENCY ABLATION KIDNEY    . RIGHT/LEFT HEART CATH AND CORONARY ANGIOGRAPHY N/A 04/16/2019   Procedure: RIGHT/LEFT HEART CATH AND CORONARY ANGIOGRAPHY;  Surgeon: Burnell Blanks, MD;  Location: [facility];  Service: Cardiovascular;  Laterality: N/A;  . TEE WITHOUT CARDIOVERSION N/A 04/16/2019   Procedure: TRANSESOPHAGEAL ECHOCARDIOGRAM (TEE);  Surgeon: Lajuana Matte, MD;  Location: [facility];  Service: Open Heart Surgery;  Laterality: N/A;  . TEE WITHOUT CARDIOVERSION N/A 10/08/2019   Procedure: TRANSESOPHAGEAL ECHOCARDIOGRAM (TEE);  Surgeon: Larey Dresser, MD;  Location: [facility] ENDOSCOPY;  Service: Cardiovascular;  Laterality: N/A;  . TOTAL KNEE ARTHROPLASTY     left  . VENTRICULAR ASSIST DEVICE INSERTION N/A 04/16/2019   Procedure: VENTRICULAR ASSIST DEVICE INSERTION;  Surgeon: Burnell Blanks, MD;  Location: [facility];  Service: Cardiovascular;   Laterality: N/A;   Social History   Occupational History  . Not on file  Tobacco Use  . Smoking status: Former Smoker    Quit date: 07/23/1980    Years since quitting: 39.2  . Smokeless tobacco: Never Used  Substance and Sexual Activity  . Alcohol use: Yes    Alcohol/week: 1.0 standard drinks    Types: 1 Cans of beer per week    Comment: rare  . Drug use: No  . Sexual activity: Not Currently

## 2019-10-29 NOTE — Telephone Encounter (Signed)
Please submit for Synvisc one  gel injection-Right knee-Dr Erlinda Hong.

## 2019-10-29 NOTE — Telephone Encounter (Signed)
Approved for Synvisc One- Right knee Dr. Frederik Pear and Bill $30 copay 20% OOP No prior auth required

## 2019-10-29 NOTE — Telephone Encounter (Signed)
Submitted for VOB for Synvisc One-Right knee

## 2019-10-29 NOTE — Telephone Encounter (Signed)
Pt called and informed and stated understanding to approval and copay. Appointment was scheduled for 3 weeks out. Patient wanted to give the cortisone shot some time.

## 2019-11-03 ENCOUNTER — Telehealth: Payer: Self-pay | Admitting: Family Medicine

## 2019-11-03 ENCOUNTER — Telehealth: Payer: Self-pay | Admitting: *Deleted

## 2019-11-03 NOTE — Telephone Encounter (Signed)
Pat called back missed Julies call to schedule AWV/ pt states that he just had [employer] come to his house last week he said that if you want to call homback uypur more than welcome .

## 2019-11-03 NOTE — Telephone Encounter (Signed)
Schedule AWV.  

## 2019-11-11 NOTE — Telephone Encounter (Signed)
Pt checking on status of this message.  °

## 2019-11-19 ENCOUNTER — Ambulatory Visit: Payer: Medicare Other | Admitting: Orthopaedic Surgery

## 2019-11-19 ENCOUNTER — Other Ambulatory Visit: Payer: Self-pay

## 2019-11-19 ENCOUNTER — Encounter: Payer: Self-pay | Admitting: Orthopaedic Surgery

## 2019-11-19 VITALS — Ht 72.0 in | Wt 300.0 lb

## 2019-11-19 DIAGNOSIS — M1711 Unilateral primary osteoarthritis, right knee: Secondary | ICD-10-CM

## 2019-11-19 MED ORDER — LIDOCAINE HCL 1 % IJ SOLN
2.0000 mL | INTRAMUSCULAR | Status: AC | PRN
  Administered 2019-11-19: 2 mL

## 2019-11-19 MED ORDER — BUPIVACAINE HCL 0.25 % IJ SOLN
2.0000 mL | INTRAMUSCULAR | Status: AC | PRN
  Administered 2019-11-19: 08:00:00 2 mL via INTRA_ARTICULAR

## 2019-11-19 MED ORDER — HYLAN G-F 20 48 MG/6ML IX SOSY
48.0000 mg | PREFILLED_SYRINGE | INTRA_ARTICULAR | Status: AC | PRN
  Administered 2019-11-19: 48 mg via INTRA_ARTICULAR

## 2019-11-19 NOTE — Progress Notes (Signed)
   Procedure Note  Patient: Alex Williams             Date of Birth: 08-11-1950           MRN: [id number]             Visit Date: 11/19/2019  Procedures: Visit Diagnoses:  1. Primary localized osteoarthritis of right knee     Large Joint Inj: R knee on 11/19/2019 8:20 AM Indications: pain Details: 22 G needle, anterolateral approach Medications: 2 mL lidocaine 1 %; 2 mL bupivacaine 0.25 %; 48 mg Hylan 48 MG/6ML

## 2019-11-26 ENCOUNTER — Other Ambulatory Visit (HOSPITAL_COMMUNITY): Payer: Self-pay | Admitting: Cardiology

## 2019-12-17 ENCOUNTER — Other Ambulatory Visit: Payer: Self-pay | Admitting: Family Medicine

## 2019-12-18 ENCOUNTER — Other Ambulatory Visit: Payer: Self-pay

## 2019-12-18 ENCOUNTER — Ambulatory Visit (INDEPENDENT_AMBULATORY_CARE_PROVIDER_SITE_OTHER): Payer: Medicare Other | Admitting: Family Medicine

## 2019-12-18 ENCOUNTER — Encounter: Payer: Self-pay | Admitting: Family Medicine

## 2019-12-18 ENCOUNTER — Ambulatory Visit
Admission: RE | Admit: 2019-12-18 | Discharge: 2019-12-18 | Disposition: A | Discharge status: Home / Self Care | Payer: Medicare Other | Source: Ambulatory Visit | Attending: Family Medicine | Admitting: Family Medicine

## 2019-12-18 VITALS — BP 110/60 | HR 64 | Temp 98.3°F | Ht 72.0 in

## 2019-12-18 DIAGNOSIS — M25551 Pain in right hip: Secondary | ICD-10-CM

## 2019-12-18 MED ORDER — HYDROCODONE-ACETAMINOPHEN 5-325 MG PO TABS: 1.0000 | ORAL_TABLET | Freq: Four times a day (QID) | ORAL | 0 refills | Status: DC | PRN

## 2019-12-18 NOTE — Progress Notes (Signed)
5/28/20212:12 PM  Alex Williams 1950/11/21, 69 y.o., male [id number]  Chief Complaint  Patient presents with  . Pain    right hip pain due to slight fall while standing , fell bk into the chair bracing himself on his left arm. pai began yesterday. taking tramodol for the pain    HPI:   Patient is a 69 y.o. male with past medical history significant for HTN, HLP, gout, ESRDon HD, MI s/p 3V CABG, knee OA who presents today for right hip after fall  Last weekend when walking up the row during his cousins funeral he lost his balance and fell into a chair but he did not have any pain afterwards and was moving just fine However yesterday started having crampy pain in right low back and hip Using tramadol - did not help at all Using his walker   Depression screen [facility] 2/9 10/20/2019 09/03/2019 06/04/2019  Decreased Interest 0 0 0  Down, Depressed, Hopeless 0 0 0  PHQ - 2 Score 0 0 0  Altered sleeping - - -  Tired, decreased energy - - -  Change in appetite - - -  Feeling bad or failure about yourself  - - -  Trouble concentrating - - -  Moving slowly or fidgety/restless - - -  Suicidal thoughts - - -  PHQ-9 Score - - -  Difficult doing work/chores - - -  Some recent data might be hidden    Fall Risk  10/20/2019 09/03/2019 08/24/2019 06/26/2019 06/04/2019  Falls in the past year? 0 0 0 1 1  Number falls in past yr: 0 0 - 0 0  Comment - - - - -  Injury with Fall? 0 0 - - 0  Comment - - - - -  Risk for fall due to : Impaired mobility - - - History of fall(s);Impaired balance/gait;Impaired mobility;Impaired vision;Orthopedic patient  Follow up - Falls evaluation completed - - -     Allergies  Allergen Reactions  . Codeine Phosphate Other (See Comments)    Hyperactive     Prior to Admission medications   Medication Sig Start Date End Date Taking? Authorizing Provider  allopurinol (ZYLOPRIM) 100 MG tablet Take 1 tablet (100 mg total) by mouth daily. 05/08/19   Angiulli, Lavon Paganini, PA-C  aspirin EC 81 MG EC tablet Take 1 tablet (81 mg total) by mouth daily. 05/01/19   Elgie Collard, PA-C  bisoprolol (ZEBETA) 5 MG tablet TAKE 1 TABLET AS DIRECTED ON NON DIALYSIS TUES,THURS,SAT,SUN NEEDS APPT 11/26/19   Larey Dresser, MD  cinacalcet (SENSIPAR) 30 MG tablet Take 1 tablet (30 mg total) by mouth daily with supper. 05/22/19   Lajuana Matte, MD  diclofenac Sodium (VOLTAREN) 1 % GEL Apply 2 g topically daily as needed (for knee).    [provider]  ELIQUIS 5 MG TABS tablet TAKE 1 TABLET BY MOUTH TWICE A DAY 10/21/19   Larey Dresser, MD  ferric citrate (AURYXIA) 1 GM 210 MG(Fe) tablet Take 420 mg by mouth 3 (three) times daily with meals.     [provider]  fluticasone (FLONASE) 50 MCG/ACT nasal spray Place 1 spray into both nostrils daily as needed for allergies or rhinitis.    [provider]  gabapentin (NEURONTIN) 100 MG capsule TAKE 1 CAPSULE AT BEDTIME ON HD DAYS (M,W,F) 12/17/19   [name], MD  midodrine (PROAMATINE) 10 MG tablet Take 1 tablet (10 mg total) by mouth every Monday,  Wednesday, and Friday with hemodialysis. 05/08/19   Angiulli, Lavon Paganini, PA-C  multivitamin (RENA-VIT) TABS tablet Take 1 tablet by mouth at bedtime. 05/07/19   Angiulli, Lavon Paganini, PA-C  ondansetron (ZOFRAN-ODT) 8 MG disintegrating tablet Take 1 tablet (8 mg total) by mouth every 8 (eight) hours as needed for nausea or vomiting. 10/20/19   [name], MD  pantoprazole (PROTONIX) 40 MG tablet Take 40 mg by mouth daily as needed.    [provider]  pregabalin (LYRICA) 100 MG capsule Take 1 capsule (100 mg total) by mouth at bedtime. 06/26/19 06/25/20  Lovorn, Jinny Blossom, MD  rosuvastatin (CRESTOR) 10 MG tablet Take 1 tablet (10 mg total) by mouth daily. 09/29/19 12/28/19  Larey Dresser, MD  traMADol (ULTRAM) 50 MG tablet Take 1 tablet (50 mg total) by mouth every 12 (twelve) hours as needed for moderate pain. 09/03/19   [name], MD    Past  Medical History:  Diagnosis Date  . Cervical disc disease   . Chronic kidney disease   . COLONIC POLYPS, HX OF 06/27/2007  . EXOGENOUS OBESITY 01/30/2010  . GERD (gastroesophageal reflux disease)    PMH  . GOUT 01/30/2010  . HYPERCHOLESTEROLEMIA 06/30/2007  . HYPERTENSION 06/27/2007  . LOW BACK PAIN 06/27/2007  . NEPHROLITHIASIS, HX OF 06/27/2007  . OSTEOARTHRITIS 06/27/2007  . SLEEP APNEA, OBSTRUCTIVE, MODERATE 01/27/2009  . Wears dentures    upper    Past Surgical History:  Procedure Laterality Date  . AV FISTULA PLACEMENT Right 01/19/2019   Procedure: RIGHT BRACHIOCEPHALIC ARTERIOVENOUS (AV) FISTULA CREATION;  Surgeon: Angelia Mould, MD;  Location: [place];  Service: Vascular;  Laterality: Right;  . CARDIAC CATHETERIZATION    . CARPAL TUNNEL RELEASE     left hand  . COLONOSCOPY     with polypectomy  . CORONARY ARTERY BYPASS GRAFT N/A 04/16/2019   Procedure: CORONARY ARTERY BYPASS GRAFTING (CABG)X3  , WITH ENDOSCOPIC HARVESTING OF RIGHT GREATER SAPHENOUS VEIN;  Surgeon: Lajuana Matte, MD;  Location: [place];  Service: Open Heart Surgery;  Laterality: N/A;  . CORONARY/GRAFT ACUTE MI REVASCULARIZATION N/A 04/16/2019   Procedure: CORONARY/GRAFT ACUTE MI REVASCULARIZATION;  Surgeon: Burnell Blanks, MD;  Location: [place] [facility];  Service: Cardiovascular;  Laterality: N/A;  . INSERTION OF DIALYSIS CATHETER N/A 04/29/2019   Procedure: INSERTION OF TUNNELED DIALYSIS CATHETER, right internal jugular;  Surgeon: Rosetta Posner, MD;  Location: [place];  Service: Vascular;  Laterality: N/A;  . JOINT REPLACEMENT Left 2005   knee  . LUMBAR LAMINECTOMY    . MULTIPLE TOOTH EXTRACTIONS    . PERIPHERAL VASCULAR BALLOON ANGIOPLASTY Right 06/25/2019   Procedure: PERIPHERAL VASCULAR BALLOON ANGIOPLASTY;  Surgeon: Marty Heck, MD;  Location: [place] [facility];  Service: Cardiovascular;  Laterality: Right;  . RADIOFREQUENCY ABLATION KIDNEY    . RIGHT/LEFT HEART CATH AND CORONARY  ANGIOGRAPHY N/A 04/16/2019   Procedure: RIGHT/LEFT HEART CATH AND CORONARY ANGIOGRAPHY;  Surgeon: Burnell Blanks, MD;  Location: [place] [facility];  Service: Cardiovascular;  Laterality: N/A;  . TEE WITHOUT CARDIOVERSION N/A 04/16/2019   Procedure: TRANSESOPHAGEAL ECHOCARDIOGRAM (TEE);  Surgeon: Lajuana Matte, MD;  Location: [place];  Service: Open Heart Surgery;  Laterality: N/A;  . TEE WITHOUT CARDIOVERSION N/A 10/08/2019   Procedure: TRANSESOPHAGEAL ECHOCARDIOGRAM (TEE);  Surgeon: Larey Dresser, MD;  Location: [facility];  Service: Cardiovascular;  Laterality: N/A;  . TOTAL KNEE ARTHROPLASTY     left  . VENTRICULAR ASSIST DEVICE INSERTION  N/A 04/16/2019   Procedure: VENTRICULAR ASSIST DEVICE INSERTION;  Surgeon: Burnell Blanks, MD;  Location: [place] [facility];  Service: Cardiovascular;  Laterality: N/A;    Social History   Tobacco Use  . Smoking status: Former Smoker    Quit date: 07/23/1980    Years since quitting: 39.4  . Smokeless tobacco: Never Used  Substance Use Topics  . Alcohol use: Yes    Alcohol/week: 1.0 standard drinks    Types: 1 Cans of beer per week    Comment: rare    Family History  Problem Relation Age of Onset  . Hypertension Other   . Diabetes Sister   . Hyperlipidemia Sister   . Sleep apnea Sister     ROS Per hpi  OBJECTIVE:  Today's Vitals   12/18/19 1412  BP: 110/60  Pulse: 64  Temp: 98.3 F (36.8 C)  SpO2: 94%  Height: 6' (1.829 m)   Body mass index is 40.69 kg/m.   Physical Exam   Gen: AAOx3, NAD but in obvious pain when moving Right hip: guarded, holding in external rotation, TTP along posterior aspect. Exam limited due to pain. Pain with minimal flexion that radiated towards anterior aspect. Distally NVI.   No results found for this or any previous visit (from the past 24 hour(s)).  DG HIP UNILAT W OR W/O PELVIS 2-3 VIEWS RIGHT  Result Date: 12/18/2019 CLINICAL DATA:  Fall, hip pain EXAM: DG HIP (WITH  OR WITHOUT PELVIS) 2-3V RIGHT COMPARISON:  None. FINDINGS: Alignment is anatomic. There is no acute fracture. Joint space is preserved. IMPRESSION: No acute fracture. Electronically Signed   By: Macy Mis M.D.   On: 12/18/2019 15:47     ASSESSMENT and PLAN  1. Acute right hip pain No fracture, treat as muscle strain. Discussed conservative measures. Limited medication use due to ESRD. vicodin prn mod-severe pain. Reviewed r/se/b. pmp reviewed. RTC precautions discussed.  - DG HIP UNILAT W OR W/O PELVIS 2-3 VIEWS RIGHT; Future  Other orders - HYDROcodone-acetaminophen (NORCO/VICODIN) 5-325 MG tablet; Take 1 tablet by mouth every 6 (six) hours as needed for moderate pain.  No follow-ups on file.    [name], MD [facility] at [facility], [place] [address] Ph.  [phone] Fax [phone]

## 2019-12-18 NOTE — Patient Instructions (Signed)
° ° ° °  If you have lab work done today you will be contacted with your lab results within the next 2 weeks.  If you have not heard from us then please contact us. The fastest way to get your results is to register for My Chart. ° ° °IF you received an x-ray today, you will receive an invoice from [facility]. Please contact [facility] at [phone] with questions or concerns regarding your invoice.  ° °IF you received labwork today, you will receive an invoice from [facility]. Please contact [facility] at [phone] with questions or concerns regarding your invoice.  ° °Our billing staff will not be able to assist you with questions regarding bills from these companies. ° °You will be contacted with the lab results as soon as they are available. The fastest way to get your results is to activate your My Chart account. Instructions are located on the last page of this paperwork. If you have not heard from us regarding the results in 2 weeks, please contact this office. °  ° ° ° °

## 2019-12-20 ENCOUNTER — Encounter: Payer: Self-pay | Admitting: Family Medicine

## 2019-12-28 ENCOUNTER — Observation Stay (HOSPITAL_COMMUNITY): Payer: Medicare Other

## 2019-12-28 ENCOUNTER — Other Ambulatory Visit: Payer: Self-pay

## 2019-12-28 ENCOUNTER — Emergency Department (HOSPITAL_COMMUNITY): Payer: Medicare Other

## 2019-12-28 ENCOUNTER — Ambulatory Visit: Payer: Medicare Other | Admitting: Registered Nurse

## 2019-12-28 ENCOUNTER — Emergency Department (HOSPITAL_BASED_OUTPATIENT_CLINIC_OR_DEPARTMENT_OTHER)
Admit: 2019-12-28 | Discharge: 2019-12-28 | Disposition: A | Discharge status: Home / Self Care | Payer: Medicare Other | Attending: Emergency Medicine | Admitting: Emergency Medicine

## 2019-12-28 ENCOUNTER — Observation Stay (HOSPITAL_COMMUNITY)
Admission: EM | Admit: 2019-12-28 | Discharge: 2019-12-29 | Disposition: A | Discharge status: Home / Self Care | Payer: Medicare Other | Attending: Internal Medicine | Admitting: Internal Medicine

## 2019-12-28 DIAGNOSIS — I509 Heart failure, unspecified: Secondary | ICD-10-CM | POA: Diagnosis not present

## 2019-12-28 DIAGNOSIS — Z6841 Body Mass Index (BMI) 40.0 and over, adult: Secondary | ICD-10-CM | POA: Insufficient documentation

## 2019-12-28 DIAGNOSIS — M109 Gout, unspecified: Secondary | ICD-10-CM | POA: Diagnosis not present

## 2019-12-28 DIAGNOSIS — L03115 Cellulitis of right lower limb: Secondary | ICD-10-CM | POA: Insufficient documentation

## 2019-12-28 DIAGNOSIS — I252 Old myocardial infarction: Secondary | ICD-10-CM | POA: Insufficient documentation

## 2019-12-28 DIAGNOSIS — Z9119 Patient's noncompliance with other medical treatment and regimen: Secondary | ICD-10-CM | POA: Insufficient documentation

## 2019-12-28 DIAGNOSIS — Z7901 Long term (current) use of anticoagulants: Secondary | ICD-10-CM | POA: Insufficient documentation

## 2019-12-28 DIAGNOSIS — R609 Edema, unspecified: Secondary | ICD-10-CM

## 2019-12-28 DIAGNOSIS — L538 Other specified erythematous conditions: Secondary | ICD-10-CM

## 2019-12-28 DIAGNOSIS — I132 Hypertensive heart and chronic kidney disease with heart failure and with stage 5 chronic kidney disease, or end stage renal disease: Secondary | ICD-10-CM | POA: Diagnosis not present

## 2019-12-28 DIAGNOSIS — L02415 Cutaneous abscess of right lower limb: Secondary | ICD-10-CM | POA: Insufficient documentation

## 2019-12-28 DIAGNOSIS — E785 Hyperlipidemia, unspecified: Secondary | ICD-10-CM | POA: Insufficient documentation

## 2019-12-28 DIAGNOSIS — K219 Gastro-esophageal reflux disease without esophagitis: Secondary | ICD-10-CM | POA: Insufficient documentation

## 2019-12-28 DIAGNOSIS — Z1624 Resistance to multiple antibiotics: Secondary | ICD-10-CM | POA: Diagnosis not present

## 2019-12-28 DIAGNOSIS — I959 Hypotension, unspecified: Secondary | ICD-10-CM | POA: Insufficient documentation

## 2019-12-28 DIAGNOSIS — G4733 Obstructive sleep apnea (adult) (pediatric): Secondary | ICD-10-CM | POA: Insufficient documentation

## 2019-12-28 DIAGNOSIS — I2510 Atherosclerotic heart disease of native coronary artery without angina pectoris: Secondary | ICD-10-CM | POA: Diagnosis not present

## 2019-12-28 DIAGNOSIS — Z96653 Presence of artificial knee joint, bilateral: Secondary | ICD-10-CM | POA: Insufficient documentation

## 2019-12-28 DIAGNOSIS — Z8249 Family history of ischemic heart disease and other diseases of the circulatory system: Secondary | ICD-10-CM | POA: Insufficient documentation

## 2019-12-28 DIAGNOSIS — Z87891 Personal history of nicotine dependence: Secondary | ICD-10-CM | POA: Insufficient documentation

## 2019-12-28 DIAGNOSIS — Z992 Dependence on renal dialysis: Secondary | ICD-10-CM | POA: Diagnosis not present

## 2019-12-28 DIAGNOSIS — N186 End stage renal disease: Secondary | ICD-10-CM | POA: Diagnosis not present

## 2019-12-28 DIAGNOSIS — Z20822 Contact with and (suspected) exposure to covid-19: Secondary | ICD-10-CM | POA: Insufficient documentation

## 2019-12-28 DIAGNOSIS — M1990 Unspecified osteoarthritis, unspecified site: Secondary | ICD-10-CM | POA: Insufficient documentation

## 2019-12-28 DIAGNOSIS — E7800 Pure hypercholesterolemia, unspecified: Secondary | ICD-10-CM | POA: Diagnosis not present

## 2019-12-28 DIAGNOSIS — L0291 Cutaneous abscess, unspecified: Secondary | ICD-10-CM

## 2019-12-28 DIAGNOSIS — Z885 Allergy status to narcotic agent status: Secondary | ICD-10-CM | POA: Insufficient documentation

## 2019-12-28 DIAGNOSIS — N2581 Secondary hyperparathyroidism of renal origin: Secondary | ICD-10-CM | POA: Insufficient documentation

## 2019-12-28 DIAGNOSIS — Z7982 Long term (current) use of aspirin: Secondary | ICD-10-CM | POA: Insufficient documentation

## 2019-12-28 DIAGNOSIS — R079 Chest pain, unspecified: Secondary | ICD-10-CM

## 2019-12-28 DIAGNOSIS — I480 Paroxysmal atrial fibrillation: Secondary | ICD-10-CM | POA: Insufficient documentation

## 2019-12-28 DIAGNOSIS — R0602 Shortness of breath: Secondary | ICD-10-CM

## 2019-12-28 DIAGNOSIS — R600 Localized edema: Secondary | ICD-10-CM | POA: Insufficient documentation

## 2019-12-28 DIAGNOSIS — Z79899 Other long term (current) drug therapy: Secondary | ICD-10-CM | POA: Insufficient documentation

## 2019-12-28 DIAGNOSIS — D631 Anemia in chronic kidney disease: Secondary | ICD-10-CM | POA: Insufficient documentation

## 2019-12-28 DIAGNOSIS — A4901 Methicillin susceptible Staphylococcus aureus infection, unspecified site: Principal | ICD-10-CM | POA: Insufficient documentation

## 2019-12-28 DIAGNOSIS — N179 Acute kidney failure, unspecified: Secondary | ICD-10-CM | POA: Diagnosis not present

## 2019-12-28 DIAGNOSIS — Z951 Presence of aortocoronary bypass graft: Secondary | ICD-10-CM | POA: Insufficient documentation

## 2019-12-28 LAB — CBC WITH DIFFERENTIAL/PLATELET
Abs Immature Granulocytes: 0.04 10*3/uL (ref 0.00–0.07)
Basophils Absolute: 0.1 10*3/uL (ref 0.0–0.1)
Basophils Relative: 1 %
Eosinophils Absolute: 0.1 10*3/uL (ref 0.0–0.5)
Eosinophils Relative: 1 %
HCT: 36 % — ABNORMAL LOW (ref 39.0–52.0)
Hemoglobin: 11 g/dL — ABNORMAL LOW (ref 13.0–17.0)
Immature Granulocytes: 1 %
Lymphocytes Relative: 27 %
Lymphs Abs: 2.4 10*3/uL (ref 0.7–4.0)
MCH: 26.7 pg (ref 26.0–34.0)
MCHC: 30.6 g/dL (ref 30.0–36.0)
MCV: 87.4 fL (ref 80.0–100.0)
Monocytes Absolute: 0.7 10*3/uL (ref 0.1–1.0)
Monocytes Relative: 8 %
Neutro Abs: 5.6 10*3/uL (ref 1.7–7.7)
Neutrophils Relative %: 62 %
Platelets: 206 10*3/uL (ref 150–400)
RBC: 4.12 MIL/uL — ABNORMAL LOW (ref 4.22–5.81)
RDW: 19.2 % — ABNORMAL HIGH (ref 11.5–15.5)
WBC: 8.9 10*3/uL (ref 4.0–10.5)
nRBC: 0 % (ref 0.0–0.2)

## 2019-12-28 LAB — TROPONIN I (HIGH SENSITIVITY)
Troponin I (High Sensitivity): 47 ng/L — ABNORMAL HIGH (ref <18)
Troponin I (High Sensitivity): 43 ng/L — ABNORMAL HIGH (ref <18)

## 2019-12-28 LAB — COMPREHENSIVE METABOLIC PANEL
ALT: 32 U/L (ref 0–44)
AST: 24 U/L (ref 15–41)
Albumin: 2.7 g/dL — ABNORMAL LOW (ref 3.5–5.0)
Alkaline Phosphatase: 116 U/L (ref 38–126)
Anion gap: 18 — ABNORMAL HIGH (ref 5–15)
BUN: 77 mg/dL — ABNORMAL HIGH (ref 8–23)
CO2: 19 mmol/L — ABNORMAL LOW (ref 22–32)
Calcium: 8.9 mg/dL (ref 8.9–10.3)
Chloride: 101 mmol/L (ref 98–111)
Creatinine, Ser: 13.8 mg/dL — ABNORMAL HIGH (ref 0.61–1.24)
GFR calc Af Amer: 4 mL/min — ABNORMAL LOW (ref >60)
GFR calc non Af Amer: 3 mL/min — ABNORMAL LOW (ref >60)
Glucose, Bld: 77 mg/dL (ref 70–99)
Potassium: 5.8 mmol/L — ABNORMAL HIGH (ref 3.5–5.1)
Sodium: 138 mmol/L (ref 135–145)
Total Bilirubin: 1.1 mg/dL (ref 0.3–1.2)
Total Protein: 6.2 g/dL — ABNORMAL LOW (ref 6.5–8.1)

## 2019-12-28 LAB — SARS CORONAVIRUS 2 BY RT PCR (HOSPITAL ORDER, PERFORMED IN ~~LOC~~ HOSPITAL LAB): SARS Coronavirus 2: NEGATIVE

## 2019-12-28 LAB — HIV ANTIBODY (ROUTINE TESTING W REFLEX): HIV Screen 4th Generation wRfx: NONREACTIVE

## 2019-12-28 MED ORDER — RENA-VITE PO TABS
1.0000 | ORAL_TABLET | Freq: Every day | ORAL | Status: DC
  Administered 2019-12-28: 1 via ORAL
  Filled 2019-12-28: qty 1

## 2019-12-28 MED ORDER — SODIUM ZIRCONIUM CYCLOSILICATE 5 G PO PACK
5.0000 g | PACK | Freq: Once | ORAL | Status: AC
  Administered 2019-12-28: 5 g via ORAL
  Filled 2019-12-28 (×2): qty 1

## 2019-12-28 MED ORDER — CHLORHEXIDINE GLUCONATE CLOTH 2 % EX PADS
6.0000 | MEDICATED_PAD | Freq: Every day | CUTANEOUS | Status: DC
  Administered 2019-12-29: 6 via TOPICAL

## 2019-12-28 MED ORDER — VANCOMYCIN HCL 10 G IV SOLR
2500.0000 mg | Freq: Once | INTRAVENOUS | Status: AC
  Filled 2019-12-28: qty 2500

## 2019-12-28 MED ORDER — ASPIRIN EC 81 MG PO TBEC
81.0000 mg | DELAYED_RELEASE_TABLET | Freq: Every day | ORAL | Status: DC
  Administered 2019-12-29: 81 mg via ORAL
  Filled 2019-12-28: qty 1

## 2019-12-28 MED ORDER — VANCOMYCIN HCL IN DEXTROSE 1-5 GM/200ML-% IV SOLN: 1000.0000 mg | INTRAVENOUS | Status: DC

## 2019-12-28 MED ORDER — VANCOMYCIN HCL IN DEXTROSE 1-5 GM/200ML-% IV SOLN
1000.0000 mg | Freq: Once | INTRAVENOUS | Status: AC
  Administered 2019-12-29: 1000 mg via INTRAVENOUS
  Filled 2019-12-28: qty 200

## 2019-12-28 MED ORDER — PANTOPRAZOLE SODIUM 40 MG PO TBEC: 40.0000 mg | DELAYED_RELEASE_TABLET | Freq: Every day | ORAL | Status: DC | PRN

## 2019-12-28 MED ORDER — ROSUVASTATIN CALCIUM 5 MG PO TABS
10.0000 mg | ORAL_TABLET | Freq: Every day | ORAL | Status: DC
  Administered 2019-12-28 – 2019-12-29 (×2): 10 mg via ORAL
  Filled 2019-12-28 (×2): qty 2

## 2019-12-28 MED ORDER — MIDODRINE HCL 5 MG PO TABS: 10.0000 mg | ORAL_TABLET | ORAL | Status: DC

## 2019-12-28 MED ORDER — APIXABAN 5 MG PO TABS: 5.0000 mg | ORAL_TABLET | Freq: Two times a day (BID) | ORAL | Status: DC

## 2019-12-28 MED ORDER — HYDROMORPHONE HCL 1 MG/ML IJ SOLN
0.3000 mg | INTRAMUSCULAR | Status: DC | PRN
  Administered 2019-12-28 – 2019-12-29 (×4): 0.3 mg via INTRAVENOUS
  Filled 2019-12-28 (×3): qty 1

## 2019-12-28 MED ORDER — AMIODARONE HCL 200 MG PO TABS
200.0000 mg | ORAL_TABLET | Freq: Every day | ORAL | Status: DC
  Administered 2019-12-28 – 2019-12-29 (×2): 200 mg via ORAL
  Filled 2019-12-28 (×2): qty 1

## 2019-12-28 MED ORDER — FERRIC CITRATE 1 GM 210 MG(FE) PO TABS
420.0000 mg | ORAL_TABLET | Freq: Three times a day (TID) | ORAL | Status: DC
  Administered 2019-12-28 – 2019-12-29 (×3): 420 mg via ORAL
  Filled 2019-12-28 (×6): qty 2

## 2019-12-28 MED ORDER — HEPARIN SODIUM (PORCINE) 5000 UNIT/ML IJ SOLN
5000.0000 [IU] | Freq: Three times a day (TID) | INTRAMUSCULAR | Status: AC
  Administered 2019-12-28: 5000 [IU] via SUBCUTANEOUS
  Filled 2019-12-28: qty 1

## 2019-12-28 MED ORDER — VANCOMYCIN HCL 10 G IV SOLR
2500.0000 mg | Freq: Once | INTRAVENOUS | Status: DC
  Filled 2019-12-28: qty 2500

## 2019-12-28 MED ORDER — ACETAMINOPHEN 325 MG PO TABS: 650.0000 mg | ORAL_TABLET | Freq: Four times a day (QID) | ORAL | Status: DC | PRN

## 2019-12-28 MED ORDER — ALLOPURINOL 100 MG PO TABS
100.0000 mg | ORAL_TABLET | Freq: Every day | ORAL | Status: DC
  Administered 2019-12-29: 100 mg via ORAL
  Filled 2019-12-28: qty 1

## 2019-12-28 MED ORDER — VANCOMYCIN HCL 10 G IV SOLR
2500.0000 mg | Freq: Once | INTRAVENOUS | Status: DC
  Administered 2019-12-28: 2500 mg via INTRAVENOUS
  Filled 2019-12-28: qty 2500

## 2019-12-28 MED ORDER — BISOPROLOL FUMARATE 5 MG PO TABS
5.0000 mg | ORAL_TABLET | ORAL | Status: DC
  Administered 2019-12-29: 5 mg via ORAL
  Filled 2019-12-28: qty 1

## 2019-12-28 MED ORDER — SENNOSIDES-DOCUSATE SODIUM 8.6-50 MG PO TABS: 1.0000 | ORAL_TABLET | Freq: Every evening | ORAL | Status: DC | PRN

## 2019-12-28 NOTE — Progress Notes (Signed)
Pharmacy Antibiotic Note  Alex Williams is a 69 y.o. male admitted on 12/28/2019 with cellulitis.  Pharmacy has been consulted for vancomycin dosing. Pt is afebrile and WBC is WNL. Pt with history of ESRD on HD but missed today and last Friday HD. Plans noted for HD today (possible around midnight per RN)  Plan: Vancomcyin 2500mg  IV x 1 now then 1gm IV post HD tonight then give post HD on MWF F/u renal plans, C&S, clinical status and pre-HD vanc level PRN  Height: 6' (182.9 cm) Weight: 135.2 kg (298 lb) IBW/kg (Calculated) : 77.6  Temp (24hrs), Avg:97.7 F (36.5 C), Min:97.6 F (36.4 C), Max:97.7 F (36.5 C)  Recent Labs  Lab 12/28/19 0824  WBC 8.9  CREATININE 13.80*    Estimated Creatinine Clearance: 7.2 mL/min (A) (by C-G formula based on SCr of 13.8 mg/dL (H)).    Allergies  Allergen Reactions  . Codeine Phosphate Other (See Comments)    Hyperactive     Antimicrobials this admission: Vanc 6/7>>  Dose adjustments this admission: N/A  Microbiology results: 6/7 blood x2  Thank you for allowing pharmacy to be a part of this patient's care.  Hildred Laser, PharmD Clinical Pharmacist **Pharmacist phone directory can now be found on [email or web address] ([email or web address]).  Listed under [place].

## 2019-12-28 NOTE — Consult Note (Signed)
[facility] Renal Consultation Note    Indication for Consultation:  Management of ESRD/hemodialysis; anemia, hypertension/volume and secondary hyperparathyroidism  SVX:[id number], Lilia Argue, MD  HPI: Alex Williams is a 69 y.o. male. ESRD on HD MWF at [facility].  Past medical history significant for CAD s/p CABG, CHF EF 35% moderate MR, GERD, OSA, HTN, gout, and HLD.  Of note patient is non compliant with prescribed dialysis regimen.  Typically s/o early and missing multiple treatments recently.   Patient presented to the ED due to CP.  Reports CP and SOB with laying down starting around 3:30 AM.  Improved when he sat up and returned again with laying so he decided to come to the ED to be evaluated.  Denies fever, chills, n/v/d, current CP, palpitations, weakness, dizziness and fatigue.  Admits to ongoing orthopnea and LE edema. Reports redness, pain and increased swelling in RLE extremity x 1 weeks.  Seen by PCP on Friday and given Doxycycline with little improvement. Missed dialysis on Friday due to his doctors appointment, last HD on 6/2.    Pertinant findings in the ED include K 5.8, CXR and Xray R tib/fib with no acute findings and preliminary RLE Vas [place] with no evidence DVT. Patient has been admitted to observation or further evaluation and management.   Past Medical History:  Diagnosis Date  . Cervical disc disease   . Chronic kidney disease   . COLONIC POLYPS, HX OF 06/27/2007  . EXOGENOUS OBESITY 01/30/2010  . GERD (gastroesophageal reflux disease)    PMH  . GOUT 01/30/2010  . HYPERCHOLESTEROLEMIA 06/30/2007  . HYPERTENSION 06/27/2007  . LOW BACK PAIN 06/27/2007  . NEPHROLITHIASIS, HX OF 06/27/2007  . OSTEOARTHRITIS 06/27/2007  . SLEEP APNEA, OBSTRUCTIVE, MODERATE 01/27/2009  . Wears dentures    upper   Past Surgical History:  Procedure Laterality Date  . AV FISTULA PLACEMENT Right 01/19/2019   Procedure: RIGHT BRACHIOCEPHALIC ARTERIOVENOUS (AV) FISTULA CREATION;  Surgeon:  Angelia Mould, MD;  Location: [place];  Service: Vascular;  Laterality: Right;  . CARDIAC CATHETERIZATION    . CARPAL TUNNEL RELEASE     left hand  . COLONOSCOPY     with polypectomy  . CORONARY ARTERY BYPASS GRAFT N/A 04/16/2019   Procedure: CORONARY ARTERY BYPASS GRAFTING (CABG)X3  , WITH ENDOSCOPIC HARVESTING OF RIGHT GREATER SAPHENOUS VEIN;  Surgeon: Lajuana Matte, MD;  Location: [place];  Service: Open Heart Surgery;  Laterality: N/A;  . CORONARY/GRAFT ACUTE MI REVASCULARIZATION N/A 04/16/2019   Procedure: CORONARY/GRAFT ACUTE MI REVASCULARIZATION;  Surgeon: Burnell Blanks, MD;  Location: [place] [facility];  Service: Cardiovascular;  Laterality: N/A;  . INSERTION OF DIALYSIS CATHETER N/A 04/29/2019   Procedure: INSERTION OF TUNNELED DIALYSIS CATHETER, right internal jugular;  Surgeon: Rosetta Posner, MD;  Location: [place];  Service: Vascular;  Laterality: N/A;  . JOINT REPLACEMENT Left 2005   knee  . LUMBAR LAMINECTOMY    . MULTIPLE TOOTH EXTRACTIONS    . PERIPHERAL VASCULAR BALLOON ANGIOPLASTY Right 06/25/2019   Procedure: PERIPHERAL VASCULAR BALLOON ANGIOPLASTY;  Surgeon: Marty Heck, MD;  Location: [place] [facility];  Service: Cardiovascular;  Laterality: Right;  . RADIOFREQUENCY ABLATION KIDNEY    . RIGHT/LEFT HEART CATH AND CORONARY ANGIOGRAPHY N/A 04/16/2019   Procedure: RIGHT/LEFT HEART CATH AND CORONARY ANGIOGRAPHY;  Surgeon: Burnell Blanks, MD;  Location: [place] [facility];  Service: Cardiovascular;  Laterality: N/A;  . TEE WITHOUT CARDIOVERSION N/A 04/16/2019   Procedure: TRANSESOPHAGEAL ECHOCARDIOGRAM (  TEE);  Surgeon: Lajuana Matte, MD;  Location: [place];  Service: Open Heart Surgery;  Laterality: N/A;  . TEE WITHOUT CARDIOVERSION N/A 10/08/2019   Procedure: TRANSESOPHAGEAL ECHOCARDIOGRAM (TEE);  Surgeon: Larey Dresser, MD;  Location: [facility] ENDOSCOPY;  Service: Cardiovascular;  Laterality: N/A;  . TOTAL KNEE ARTHROPLASTY     left  .  VENTRICULAR ASSIST DEVICE INSERTION N/A 04/16/2019   Procedure: VENTRICULAR ASSIST DEVICE INSERTION;  Surgeon: Burnell Blanks, MD;  Location: [place] [facility];  Service: Cardiovascular;  Laterality: N/A;   Family History  Problem Relation Age of Onset  . Hypertension Other   . Diabetes Sister   . Hyperlipidemia Sister   . Sleep apnea Sister    Social History:  reports that he quit smoking about 39 years ago. He has never used smokeless tobacco. He reports current alcohol use of about 1.0 standard drinks of alcohol per week. He reports that he does not use drugs. Allergies  Allergen Reactions  . Codeine Phosphate Other (See Comments)    Hyperactive    Prior to Admission medications   Medication Sig Start Date End Date Taking? Authorizing Provider  allopurinol (ZYLOPRIM) 100 MG tablet Take 1 tablet (100 mg total) by mouth daily. 05/08/19  Yes Angiulli, Lavon Paganini, PA-C  amiodarone (PACERONE) 200 MG tablet Take 200 mg by mouth daily. 12/27/19  Yes [provider]  aspirin EC 81 MG EC tablet Take 1 tablet (81 mg total) by mouth daily. 05/01/19  Yes Conte, Tessa N, PA-C  bisoprolol (ZEBETA) 5 MG tablet TAKE 1 TABLET AS DIRECTED ON NON DIALYSIS TUES,THURS,SAT,SUN NEEDS APPT Patient taking differently: Take 5 mg by mouth See admin instructions. Take on non dialysis days (tues,thurs,sat,sun) 11/26/19  Yes Larey Dresser, MD  doxycycline (VIBRAMYCIN) 100 MG capsule Take 100 mg by mouth 2 (two) times daily. 12/23/19  Yes [provider]  ELIQUIS 5 MG TABS tablet TAKE 1 TABLET BY MOUTH TWICE A DAY Patient taking differently: Take 5 mg by mouth 2 (two) times daily.  10/21/19  Yes Larey Dresser, MD  ferric citrate (AURYXIA) 1 GM 210 MG(Fe) tablet Take 420 mg by mouth 3 (three) times daily with meals.    Yes [provider]  fluticasone (FLONASE) 50 MCG/ACT nasal spray Place 1 spray into both nostrils daily as needed for allergies or rhinitis.   Yes [provider]  gabapentin (NEURONTIN) 100 MG capsule TAKE 1 CAPSULE AT BEDTIME ON HD DAYS (M,W,F) Patient taking differently: Take 100 mg by mouth See admin instructions. TAKE AT BEDTIME ON HD DAYS (M,W,F) 12/17/19  Yes [name], MD  HYDROcodone-acetaminophen (NORCO/VICODIN) 5-325 MG tablet Take 1 tablet by mouth every 6 (six) hours as needed for moderate pain. 12/18/19  Yes [name], MD  midodrine (PROAMATINE) 10 MG tablet Take 1 tablet (10 mg total) by mouth every Monday, Wednesday, and Friday with hemodialysis. 05/08/19  Yes Angiulli, Lavon Paganini, PA-C  multivitamin (RENA-VIT) TABS tablet Take 1 tablet by mouth at bedtime. 05/07/19  Yes Angiulli, Lavon Paganini, PA-C  ondansetron (ZOFRAN-ODT) 8 MG disintegrating tablet Take 1 tablet (8 mg total) by mouth every 8 (eight) hours as needed for nausea or vomiting. 10/20/19  Yes [name], MD  pantoprazole (PROTONIX) 40 MG tablet Take 40 mg by mouth daily as needed (acid reflux).    Yes [provider]  rosuvastatin (CRESTOR) 10 MG tablet Take 1 tablet (10 mg total) by mouth daily. 09/29/19 12/28/19 Yes Loralie Champagne  S, MD  cinacalcet (SENSIPAR) 30 MG tablet Take 1 tablet (30 mg total) by mouth daily with supper. Patient not taking: Reported on 12/28/2019 05/22/19   Lajuana Matte, MD   Current Facility-Administered Medications  Medication Dose Route Frequency Provider Last Rate Last Admin  . [START ON 12/29/2019] allopurinol (ZYLOPRIM) tablet 100 mg  100 mg Oral Daily Seawell, Jaimie A, DO      . amiodarone (PACERONE) tablet 200 mg  200 mg Oral Daily Seawell, Jaimie A, DO      . [START ON 12/29/2019] aspirin EC tablet 81 mg  81 mg Oral Daily Seawell, Jaimie A, DO      . [START ON 12/29/2019] bisoprolol (ZEBETA) tablet 5 mg  5 mg Oral Q T,Th,S,Su Seawell, Jaimie A, DO      . [START ON 12/29/2019] Chlorhexidine Gluconate Cloth 2 % PADS 6 each  6 each Topical Q0600 Ahmadou Bolz, PA      . ferric citrate (AURYXIA) tablet 420 mg  420 mg Oral TID  WC Seawell, Jaimie A, DO      . [START ON 12/30/2019] midodrine (PROAMATINE) tablet 10 mg  10 mg Oral Q M,W,F-HD Seawell, Jaimie A, DO      . multivitamin (RENA-VIT) tablet 1 tablet  1 tablet Oral QHS Seawell, Jaimie A, DO      . pantoprazole (PROTONIX) EC tablet 40 mg  40 mg Oral Daily PRN Seawell, Jaimie A, DO      . rosuvastatin (CRESTOR) tablet 10 mg  10 mg Oral Daily Seawell, Jaimie A, DO      . senna-docusate (Senokot-S) tablet 1 tablet  1 tablet Oral QHS PRN Seawell, Jaimie A, DO      . sodium zirconium cyclosilicate (LOKELMA) packet 5 g  5 g Oral Once Seawell, Jaimie A, DO      . vancomycin (VANCOCIN) 2,500 mg in sodium chloride 0.9 % 500 mL IVPB  2,500 mg Intravenous Once Rumbarger, Valeda Malm, RPH      . [START ON 12/30/2019] vancomycin (VANCOCIN) IVPB 1000 mg/200 mL premix  1,000 mg Intravenous Q M,W,F-HD Rumbarger, Valeda Malm, RPH       Current Outpatient Medications  Medication Sig Dispense Refill  . allopurinol (ZYLOPRIM) 100 MG tablet Take 1 tablet (100 mg total) by mouth daily. 30 tablet 0  . amiodarone (PACERONE) 200 MG tablet Take 200 mg by mouth daily.    Marland Kitchen aspirin EC 81 MG EC tablet Take 1 tablet (81 mg total) by mouth daily.    . bisoprolol (ZEBETA) 5 MG tablet TAKE 1 TABLET AS DIRECTED ON NON DIALYSIS TUES,THURS,SAT,SUN NEEDS APPT (Patient taking differently: Take 5 mg by mouth See admin instructions. Take on non dialysis days (tues,thurs,sat,sun)) 90 tablet 0  . doxycycline (VIBRAMYCIN) 100 MG capsule Take 100 mg by mouth 2 (two) times daily.    Marland Kitchen ELIQUIS 5 MG TABS tablet TAKE 1 TABLET BY MOUTH TWICE A DAY (Patient taking differently: Take 5 mg by mouth 2 (two) times daily. ) 180 tablet 3  . ferric citrate (AURYXIA) 1 GM 210 MG(Fe) tablet Take 420 mg by mouth 3 (three) times daily with meals.     . fluticasone (FLONASE) 50 MCG/ACT nasal spray Place 1 spray into both nostrils daily as needed for allergies or rhinitis.    Marland Kitchen gabapentin (NEURONTIN) 100 MG capsule TAKE 1 CAPSULE AT  BEDTIME ON HD DAYS (M,W,F) (Patient taking differently: Take 100 mg by mouth See admin instructions. TAKE AT BEDTIME ON HD DAYS (M,W,F)) 30  capsule 2  . HYDROcodone-acetaminophen (NORCO/VICODIN) 5-325 MG tablet Take 1 tablet by mouth every 6 (six) hours as needed for moderate pain. 20 tablet 0  . midodrine (PROAMATINE) 10 MG tablet Take 1 tablet (10 mg total) by mouth every Monday, Wednesday, and Friday with hemodialysis. 30 tablet 0  . multivitamin (RENA-VIT) TABS tablet Take 1 tablet by mouth at bedtime. 30 tablet 0  . ondansetron (ZOFRAN-ODT) 8 MG disintegrating tablet Take 1 tablet (8 mg total) by mouth every 8 (eight) hours as needed for nausea or vomiting. 20 tablet 5  . pantoprazole (PROTONIX) 40 MG tablet Take 40 mg by mouth daily as needed (acid reflux).     . rosuvastatin (CRESTOR) 10 MG tablet Take 1 tablet (10 mg total) by mouth daily. 90 tablet 3  . cinacalcet (SENSIPAR) 30 MG tablet Take 1 tablet (30 mg total) by mouth daily with supper. (Patient not taking: Reported on 12/28/2019) 60 tablet 0   Labs: Basic Metabolic Panel: Recent Labs  Lab 12/28/19 0824  NA 138  K 5.8*  CL 101  CO2 19*  GLUCOSE 77  BUN 77*  CREATININE 13.80*  CALCIUM 8.9   Liver Function Tests: Recent Labs  Lab 12/28/19 0824  AST 24  ALT 32  ALKPHOS 116  BILITOT 1.1  PROT 6.2*  ALBUMIN 2.7*   CBC: Recent Labs  Lab 12/28/19 0824  WBC 8.9  NEUTROABS 5.6  HGB 11.0*  HCT 36.0*  MCV 87.4  PLT 206   Studies/Results: DG Tibia/Fibula Right  Result Date: 12/28/2019 CLINICAL DATA:  Right lower leg redness and swelling. EXAM: RIGHT TIBIA AND FIBULA - 2 VIEW COMPARISON:  None. FINDINGS: No fracture or bone lesion. No bone resorption to suggest osteomyelitis. Degenerative changes involve both the knee and ankle joints, with joint space narrowing most evident medial compartment of the knee. There is diffuse soft tissue swelling. Surgical vascular clips are noted along the medial leg consistent with  previous vein harvesting procedure. No soft tissue air. IMPRESSION: 1. No fracture, bone lesion or evidence of osteomyelitis. 2. Osteoarthritic changes of the knee and ankle joints. 3. Nonspecific soft tissue swelling.  No soft tissue air. Electronically Signed   By: Lajean Manes M.D.   On: 12/28/2019 14:53   DG Chest Portable 1 View  Result Date: 12/28/2019 CLINICAL DATA:  Pt arrived from home via [facility] c/o of chest pressure starting at approx 3am when he got up from bed to use the bathroom. Bilateral lower extremity swelling and redness EXAM: PORTABLE CHEST 1 VIEW COMPARISON:  05/22/2019 FINDINGS: Stable changes from prior CABG surgery. Cardiac silhouette is mildly enlarged. No mediastinal or masses. There are prominent bronchovascular markings. Lungs otherwise clear. No pleural effusion or pneumothorax. Skeletal structures are grossly intact IMPRESSION: No active disease. Electronically Signed   By: Lajean Manes M.D.   On: 12/28/2019 08:09   VAS [place] LOWER EXTREMITY VENOUS (DVT) ([facility] 7a-7p)  Result Date: 12/28/2019  Lower Venous DVTStudy Indications: Edema, Erythema, and currently on doxycycline.  Limitations: Body habitus and poor ultrasound/tissue interface. Comparison Study: No prior study Performing Technologist: [name] MHA, RDMS, RVT, RDCS  Examination Guidelines: A complete evaluation includes B-mode imaging, spectral Doppler, color Doppler, and power Doppler as needed of all accessible portions of each vessel. Bilateral testing is considered an integral part of a complete examination. Limited examinations for reoccurring indications may be performed as noted. The reflux portion of the exam is performed with the patient in reverse Trendelenburg.  +---------+---------------+---------+-----------+----------+--------------+ RIGHT  CompressibilityPhasicitySpontaneityPropertiesThrombus Aging +---------+---------------+---------+-----------+----------+--------------+ CFV       Full           No       Yes                                 +---------+---------------+---------+-----------+----------+--------------+ SFJ      Full                                                        +---------+---------------+---------+-----------+----------+--------------+ FV Prox  Full                                                        +---------+---------------+---------+-----------+----------+--------------+ FV Mid   Full                                                        +---------+---------------+---------+-----------+----------+--------------+ FV DistalFull                                                        +---------+---------------+---------+-----------+----------+--------------+ PFV      Full                                                        +---------+---------------+---------+-----------+----------+--------------+ POP      Full           No       Yes                                 +---------+---------------+---------+-----------+----------+--------------+ PTV      Full                                                        +---------+---------------+---------+-----------+----------+--------------+   Right Technical Findings: Not visualized segments include Peroneal veins.  +----+---------------+---------+-----------+----------+--------------+ LEFTCompressibilityPhasicitySpontaneityPropertiesThrombus Aging +----+---------------+---------+-----------+----------+--------------+ CFV Full           No       Yes                                 +----+---------------+---------+-----------+----------+--------------+     Summary: RIGHT: - There is no evidence of deep vein thrombosis in the lower extremity. However, portions of this examination were limited- see technologist comments above.  - No cystic structure found in the popliteal fossa.  LEFT: -  No evidence of common femoral vein obstruction.  Lower extremity  venous flow is pulsatile, suggestive of possibly elevated right heart pressure.  *See table(s) above for measurements and observations.    Preliminary     ROS: All others negative except those listed in HPI.  Physical Exam: Vitals:   12/28/19 0845 12/28/19 0900 12/28/19 0915 12/28/19 1100  BP: 123/81 (!) 106/92 118/89 124/75  Pulse: (!) 56 60 (!) 57 90  Resp: 17 17 16 16   Temp:      TempSrc:      SpO2: 98% 94% 97% 97%  Weight:      Height:         General: chronically ill appearing obese male in NAD Head: NCAT sclera not icteric  Neck: Supple. No lymphadenopathy Lungs: CTA bilaterally. No wheeze, rales or rhonchi. Breathing is unlabored. Heart: RRR. No murmur, rubs or gallops.  Abdomen: soft, nontender, +BS, no guarding, no rebound tenderness Lower extremities:3+ LE edema R>L, erythema over R calf, +tenderness to palpation   Neuro: AAOx3. Moves all extremities spontaneously. Psych:  Responds to questions appropriately with a normal affect. Dialysis Access: RU AVF +b  Dialysis Orders:  [facility]WF [facility]  5hrs, BFR 400, DFR 800,  EDW 134kg, 2K/ 2Ca  Access: RU AVF  Heparin 2000 Venofer 100mg  IV qHD x10, (3 completed) Sensipar 180mg  qHD   Assessment/Plan: 1. Cellulitis - Vanc started. Xray w/no evidence acute process OM.  Per primary 2. Chest pain - s/p CABG in March. Now resolved. Flat troponin's. Per primary 3. Volume overload - 2/2 non compliance.  CXR with no acute findings.  +SOB and orthopnea, 3+ LE edema in exam.  No where near dry weight.  Plan for UF 4L with HD today.   4.  ESRD -  On HD MWF. Hx non compliance.  Last HD 6/2.  K 5.8.  Orders written for HD today.   5.  Hypotension - BP soft, on midodrine.  6.  Anemia of CKD - Hgb 11.0.  No indication for ESA at this time.  7.  Secondary Hyperparathyroidism -  Ca at goal. Not on VDRA. Continue sensipar.  8.  Nutrition - Renal diet w/fluid restrictions.   Jen Mow, PA-C [facility] Pager:  [phone] 12/28/2019, 3:08 PM  2

## 2019-12-28 NOTE — Progress Notes (Signed)
Pharmacy Antibiotic Note  Alex Williams is a 69 y.o. male admitted on 12/28/2019 with cellulitis.  Pharmacy has been consulted for vancomycin dosing. Pt is afebrile and WBC is WNL. Pt with history of ESRD on HD but missed today and last Friday HD.   Plan: Vancomcyin 2500mg  IV x 1 then 1gm IV QHD F/u renal plans, C&S, clinical status and pre-HD vanc level PRN  Height: 6' (182.9 cm) Weight: 135.2 kg (298 lb) IBW/kg (Calculated) : 77.6  Temp (24hrs), Avg:97.6 F (36.4 C), Min:97.6 F (36.4 C), Max:97.6 F (36.4 C)  Recent Labs  Lab 12/28/19 0824  WBC 8.9  CREATININE 13.80*    Estimated Creatinine Clearance: 7.2 mL/min (A) (by C-G formula based on SCr of 13.8 mg/dL (H)).    Allergies  Allergen Reactions  . Codeine Phosphate Other (See Comments)    Hyperactive     Antimicrobials this admission: Vanc 6/7>>  Dose adjustments this admission: N/A  Microbiology results: Pending  Thank you for allowing pharmacy to be a part of this patient's care.  Dijon Kohlman, Rande Lawman 12/28/2019 1:53 PM

## 2019-12-28 NOTE — Progress Notes (Signed)
Pt NPO, Dr Sharon Seller paged, has pt NPO until surgery consults for possible I & D of his leg.  Will inform pt and Dr Sharon Seller asks to be notified before he goes to dialysis, at this time do not know time of dialysis, pt states he was told he may go about 6pm.

## 2019-12-28 NOTE — Progress Notes (Signed)
Right lower extremity venous duplex completed. Refer to "CV Proc" under chart review to view preliminary results.  12/28/2019 9:32 AM Kelby Aline., MHA, RVT, RDCS, RDMS

## 2019-12-28 NOTE — ED Triage Notes (Signed)
Pt arrived from home via [employer] c/o of chest pressure starting at approx 3am when he got up from bed to use the bathroom. Per pt pressure had accompanying ShoB but denies N/V or radiation. Pt took 324 mg ASA at home. Per pt pressure resolved at home after standing.  Pt goes to dialysis M,W,F and missed Friday and did not go this morning.

## 2019-12-28 NOTE — ED Notes (Signed)
Pt transported to US

## 2019-12-28 NOTE — Progress Notes (Signed)
Transport here to take pt to HD at this time. Report given to HD nurse.

## 2019-12-28 NOTE — H&P (Signed)
Date: 12/28/2019               Patient Name:  Alex Williams MRN: [id number]  DOB: 01/15/1951 Age / Sex: 69 y.o., male   PCP: [name], MD         Medical Service: Internal Medicine Teaching Service         Attending Physician: Dr. Aldine Contes, MD    First Contact: Dr. Marianna Payment Pager: [phone]  Second Contact: Dr. Sharon Seller Pager: [phone]       After Hours (After 5p/  First Contact Pager: [phone]  weekends / holidays): Second Contact Pager: [phone]   Chief Complaint: right lower leg swelling   History of Present Illness:   [name] is a 69yo male with PMH HTN, ESRD on HD MWF, CAD with recent CABG three weeks ago, GERD, and OSA presenting with swelling and pain of the right leg and shortness of breath.   He first noticed noticed the redness on his leg on Thursday around his inner calf. He was given doxycycline at the doctor on Friday and as a result missed HD Friday. He noticed worsening swelling and redness in his leg throughout the weekend with new onset shortness of breath this morning which prompted him to come to the ED.    He states that his shortness of breath started this morning and mostly occurs when he lays flat.  He does state that he has some exercise intolerance but this has been chronic since his CABG procedure.  Otherwise he denies any other signs or symptoms of hypervolemia.  He denies any chest pain, palpitations, nausea, fever, chills, arthralgias or myalgias.  He states that he has not been traveling recently and denies any sick contacts.  He states that he is usually fairly compliant with his hemodialysis sessions and takes his medications as prescribed.  This is the first time that he is missed 2 dialysis sessions in a row.  He does state that he missed taking his medications this morning due to coming to the hospital.  Last time he took his medications were yesterday including his Eliquis.  He does state that he took aspirin this morning and  received some on the EMS on the way to the hospital.  He uses CPAP at home.     Social:   He does not smoke drink alcohol or use drugs. He lives in [place] by himself. His five sisters live near hear as do his children.   Family History:  Family History  Problem Relation Age of Onset  . Hypertension Other   . Diabetes Sister   . Hyperlipidemia Sister   . Sleep apnea Sister      Meds:  Current Meds  Medication Sig  . allopurinol (ZYLOPRIM) 100 MG tablet Take 1 tablet (100 mg total) by mouth daily.  Marland Kitchen amiodarone (PACERONE) 200 MG tablet Take 200 mg by mouth daily.  Marland Kitchen aspirin EC 81 MG EC tablet Take 1 tablet (81 mg total) by mouth daily.  . bisoprolol (ZEBETA) 5 MG tablet TAKE 1 TABLET AS DIRECTED ON NON DIALYSIS TUES,THURS,SAT,SUN NEEDS APPT (Patient taking differently: Take 5 mg by mouth See admin instructions. Take on non dialysis days (tues,thurs,sat,sun))  . doxycycline (VIBRAMYCIN) 100 MG capsule Take 100 mg by mouth 2 (two) times daily.  Marland Kitchen ELIQUIS 5 MG TABS tablet TAKE 1 TABLET BY MOUTH TWICE A DAY (Patient taking differently: Take 5 mg by mouth 2 (two) times daily. )  .  ferric citrate (AURYXIA) 1 GM 210 MG(Fe) tablet Take 420 mg by mouth 3 (three) times daily with meals.   . fluticasone (FLONASE) 50 MCG/ACT nasal spray Place 1 spray into both nostrils daily as needed for allergies or rhinitis.  Marland Kitchen gabapentin (NEURONTIN) 100 MG capsule TAKE 1 CAPSULE AT BEDTIME ON HD DAYS (M,W,F) (Patient taking differently: Take 100 mg by mouth See admin instructions. TAKE AT BEDTIME ON HD DAYS (M,W,F))  . HYDROcodone-acetaminophen (NORCO/VICODIN) 5-325 MG tablet Take 1 tablet by mouth every 6 (six) hours as needed for moderate pain.  . midodrine (PROAMATINE) 10 MG tablet Take 1 tablet (10 mg total) by mouth every Monday, Wednesday, and Friday with hemodialysis.  Marland Kitchen multivitamin (RENA-VIT) TABS tablet Take 1 tablet by mouth at bedtime.  . ondansetron (ZOFRAN-ODT) 8 MG disintegrating tablet  Take 1 tablet (8 mg total) by mouth every 8 (eight) hours as needed for nausea or vomiting.  . pantoprazole (PROTONIX) 40 MG tablet Take 40 mg by mouth daily as needed (acid reflux).   . rosuvastatin (CRESTOR) 10 MG tablet Take 1 tablet (10 mg total) by mouth daily.     Allergies: Allergies as of 12/28/2019 - Review Complete 12/28/2019  Allergen Reaction Noted  . Codeine phosphate Other (See Comments)    Past Medical History:  Diagnosis Date  . Cervical disc disease   . Chronic kidney disease   . COLONIC POLYPS, HX OF 06/27/2007  . EXOGENOUS OBESITY 01/30/2010  . GERD (gastroesophageal reflux disease)    PMH  . GOUT 01/30/2010  . HYPERCHOLESTEROLEMIA 06/30/2007  . HYPERTENSION 06/27/2007  . LOW BACK PAIN 06/27/2007  . NEPHROLITHIASIS, HX OF 06/27/2007  . OSTEOARTHRITIS 06/27/2007  . SLEEP APNEA, OBSTRUCTIVE, MODERATE 01/27/2009  . Wears dentures    upper     Review of Systems: A complete ROS was negative except as per HPI.   Physical Exam: Blood pressure 124/75, pulse 90, temperature 97.6 F (36.4 C), temperature source Oral, resp. rate 16, height 6' (1.829 m), weight 135.2 kg, SpO2 97 %. Physical Exam  Constitutional: He is oriented to person, place, and time and well-developed, well-nourished, and in no distress.  HENT:  Head: Normocephalic and atraumatic.  Eyes: EOM are normal.  Neck: No tracheal deviation present.  Cardiovascular: Normal rate and intact distal pulses. Exam reveals no gallop and no friction rub.  No murmur heard. Pulmonary/Chest: Effort normal and breath sounds normal. No respiratory distress. He has no wheezes. He has no rales.  Abdominal: Soft. Bowel sounds are normal. He exhibits no distension. There is no abdominal tenderness.  Musculoskeletal:        General: Edema (bilateral pitting edema to his knees. 3+ on Rt, 1-2+ on Lt. ) present. No tenderness. Normal range of motion.     Cervical back: Normal range of motion.  Neurological: He is alert and  oriented to person, place, and time.  Skin: Skin is warm and dry. There is erythema (right medial calf erythema with fluctuant nodule.  Nodule is mildly tender to palpation and legs are cool to the touch.).    Media Information    Media Information     EKG: personally reviewed my interpretation is sinus rhythm   CXR: personally reviewed my interpretation is: Stable changes from prior CABG surgery. Cardiac silhouette is mildly enlarged. No mediastinal or masses. There are prominent bronchovascular markings. Lungs otherwise clear. No pleural effusion or pneumothorax. Skeletal structures are grossly intact  XR Tibia: 1. No fracture, bone lesion or evidence of osteomyelitis. 2.  Osteoarthritic changes of the knee and ankle joints. 3. Nonspecific soft tissue swelling.  No soft tissue air.  [place] Lower extremity: No evidence of DVT bilaterally. Although limited study.    Assessment & Plan by Problem: Active Problems:   Cellulitis of right lower extremity   In summary, Sadik Steven is a 69 y.o. male with a pertinent PMH of ESRD on dialysis producing urine, CABG x3, STEMI, hypertension, GERD, who presented to the ED with acute onset shortness of breath and right left erythemal and edema, and was admitted for further evaluation and management.   #Shortness of breath: # ESRD: #AGMA: #Hyperkalemia: Patient presented with acute onset shortness of breath this morning.  On further evaluation, patient admits to shortness of breath particularly when laying down flat.  Otherwise, he denies any worsening of exercise intolerance or shortness of breath while sitting.  On evaluation he does not have any JVD but does have significant lower extremity edema worse on the right than the left.  Patient denies any chest pain and is saturating at 100% on room air and does not appear to be in any distress.  With an elevated anion gap metabolic acidosis which is consistent with ESRD in the setting of missed  dialysis. Patient does have significant hyperkalemia of 5.8 without EKG changes. Patient was given a dose of Lokelma in the ED.  - Nephrology consulted for inpatient dialysis. Plan for HD today.  - Continue to monitor patients kidney function and electrolytes.  - I appreciate nephrologies assistance.  - Continue midodrine 10 mg with HD  #Right LE cellulitis with abscess:  Patient presents with right lower extremity swelling and erythema. There is a discrete, fluctuant nodule on the proximal/medial calf that is point tenderness. Patient was started on doxycycline last week when the erythema began but has had little improvement of his symptoms.  - Started on vancomycin - Consulted ortho for I&D  #CAD with CABG 09/2019 Patient is s/p CABG and doing well.  - Continue bisoprolol - Continue ASA 81 mg daily for 2/2 ppx. - Continue Cardiac monitoring and pulse ox  #Atrial fibrillation: Patient has a history of paroxysmal A fib on Eliquis. He states that he missed his dose this morning.  - Continue amiodarone. - Hold Eliquis due to need for I&D  #HTN #HLD: - Continue rosuvastatin 10 mg daily.   #OSA Wears home CPAP. CPAP ordered  Diet: VTE: IVF: Code:  Dispo: Admit patient to Observation with expected length of stay less than 2 midnights.  Signed: Marianna Payment, MD 12/28/2019, 4:28 PM  Pager: [phone]

## 2019-12-28 NOTE — ED Provider Notes (Addendum)
[facility] EMERGENCY DEPARTMENT Provider Note   CSN: [id number] Arrival date & time: 12/28/19  2025     History Chief Complaint  Patient presents with  . Chest Pain    Alex Williams is a 69 y.o. male.  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  Patient refers that when he stood up, he felt left-sided chest pressure/tightness which lasted about several minutes, and associated shortness of breath.  Pain was non-radiating, no nausea or vomiting .  No associated diaphoresis.  He states that by the time EMS arrived, he had taken 3 aspirins, and EMS gave him a fourth for a total of 325 mg.  He refers he only had one episode of chest pain, but his shortness of breath lingers.  He however attributes this to likely missing dialysis on Friday.  He refers that he went to his PCP to have his right leg looked at.  Patient states that on Thursday he noticed some redness and pain to his right lower leg.  He also noticed a bump on the inner part of his calf.  He states that on Friday he noticed that his leg was very stiff and hard.  However on Sunday, he states that it appears to have softened. He was started on Doxycycline on Thursday, and when he saw his PCP on Friday no additional antibiotics were added. No history of DVT/PE. He is on Eliquis and Aspirin for anticoagulation. Denies any injuries, falls, fevers, chills, hemoptysis, abdominal pain, back pain.    Past Medical History:  Diagnosis Date  . Cervical disc disease   . Chronic kidney disease   . COLONIC POLYPS, HX OF 06/27/2007  . EXOGENOUS OBESITY 01/30/2010  . GERD (gastroesophageal reflux disease)    PMH  . GOUT 01/30/2010  . HYPERCHOLESTEROLEMIA 06/30/2007  . HYPERTENSION 06/27/2007  . LOW BACK PAIN 06/27/2007  .  NEPHROLITHIASIS, HX OF 06/27/2007  . OSTEOARTHRITIS 06/27/2007  . SLEEP APNEA, OBSTRUCTIVE, MODERATE 01/27/2009  . Wears dentures    upper    Patient Active Problem List   Diagnosis Date Noted  . Cellulitis of right lower extremity 12/28/2019  . Primary osteoarthritis of right knee 10/29/2019  . Body mass index 40.0-44.9, adult ([place]) 10/29/2019  . Morbid obesity ([place]) 10/29/2019  . 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 Lesion of left ulnar nerve 06/26/2019  . Recurrent knee pain 05/01/2019  . 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 Debility 04/30/2019  . ESRD on dialysis ([place])   . Hemodialysis-associated hypotension   . Atrial fibrillation with rapid ventricular response ([place])   . Anemia due to end stage renal disease ([place])   . 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 Coronary artery disease 04/16/2019  . Acute ST elevation myocardial infarction (STEMI) involving left anterior descending (LAD) coronary artery ([place])   . Cardiogenic shock ([place])   . Acute ST elevation myocardial infarction (STEMI) of anterior wall ([place])   . Acute on chronic renal failure ([place]) 08/26/2018  . Nonspecific abnormal electrocardiogram (ECG) (EKG) 08/25/2018  . Anemia in CKD (chronic kidney disease) 08/25/2018  . Elevated troponin 08/25/2018  . SOB (shortness of breath) 08/25/2018  . Acute pulmonary edema (H[facility])   . Acute renal failure superimposed on  stage 5 chronic kidney disease, not on chronic dialysis ([place]) 02/13/2018  . Morbid obesity with BMI of 45.0-49.9, adult ([place]) 12/03/2013  . Renal mass 12/02/2013  . Lower extremity edema 03/16/2013  . CKD (chronic kidney disease), stage III 12/10/2011  . GOUT 01/30/2010  . Obstructive sleep apnea 01/27/2009  . HYPERCHOLESTEROLEMIA 06/30/2007  . Essential hypertension 06/27/2007  . Osteoarthritis 06/27/2007  . LOW BACK PAIN 06/27/2007  . History of colonic polyps 06/27/2007  . NEPHROLITHIASIS, HX OF 06/27/2007    Past  Surgical History:  Procedure Laterality Date  . AV FISTULA PLACEMENT Right 01/19/2019   Procedure: RIGHT BRACHIOCEPHALIC ARTERIOVENOUS (AV) FISTULA CREATION;  Surgeon: Angelia Mould, MD;  Location: [facility];  Service: Vascular;  Laterality: Right;  . CARDIAC CATHETERIZATION    . CARPAL TUNNEL RELEASE     left hand  . COLONOSCOPY     with polypectomy  . CORONARY ARTERY BYPASS GRAFT N/A 04/16/2019   Procedure: CORONARY ARTERY BYPASS GRAFTING (CABG)X3  , WITH ENDOSCOPIC HARVESTING OF RIGHT GREATER SAPHENOUS VEIN;  Surgeon: Lajuana Matte, MD;  Location: [facility];  Service: Open Heart Surgery;  Laterality: N/A;  . CORONARY/GRAFT ACUTE MI REVASCULARIZATION N/A 04/16/2019   Procedure: CORONARY/GRAFT ACUTE MI REVASCULARIZATION;  Surgeon: Burnell Blanks, MD;  Location: [facility];  Service: Cardiovascular;  Laterality: N/A;  . INSERTION OF DIALYSIS CATHETER N/A 04/29/2019   Procedure: INSERTION OF TUNNELED DIALYSIS CATHETER, right internal jugular;  Surgeon: Rosetta Posner, MD;  Location: [facility];  Service: Vascular;  Laterality: N/A;  . JOINT REPLACEMENT Left 2005   knee  . LUMBAR LAMINECTOMY    . MULTIPLE TOOTH EXTRACTIONS    . PERIPHERAL VASCULAR BALLOON ANGIOPLASTY Right 06/25/2019   Procedure: PERIPHERAL VASCULAR BALLOON ANGIOPLASTY;  Surgeon: Marty Heck, MD;  Location: [facility];  Service: Cardiovascular;  Laterality: Right;  . RADIOFREQUENCY ABLATION KIDNEY    . RIGHT/LEFT HEART CATH AND CORONARY ANGIOGRAPHY N/A 04/16/2019   Procedure: RIGHT/LEFT HEART CATH AND CORONARY ANGIOGRAPHY;  Surgeon: Burnell Blanks, MD;  Location: [facility];  Service: Cardiovascular;  Laterality: N/A;  . TEE WITHOUT CARDIOVERSION N/A 04/16/2019   Procedure: TRANSESOPHAGEAL ECHOCARDIOGRAM (TEE);  Surgeon: Lajuana Matte, MD;  Location: [facility];  Service: Open Heart Surgery;  Laterality: N/A;  . TEE WITHOUT CARDIOVERSION N/A 10/08/2019   Procedure:  TRANSESOPHAGEAL ECHOCARDIOGRAM (TEE);  Surgeon: Larey Dresser, MD;  Location: [facility];  Service: Cardiovascular;  Laterality: N/A;  . TOTAL KNEE ARTHROPLASTY     left  . VENTRICULAR ASSIST DEVICE INSERTION N/A 04/16/2019   Procedure: VENTRICULAR ASSIST DEVICE INSERTION;  Surgeon: Burnell Blanks, MD;  Location: [facility];  Service: Cardiovascular;  Laterality: N/A;       Family History  Problem Relation Age of Onset  . Hypertension Other   . Diabetes Sister   . Hyperlipidemia Sister   . Sleep apnea Sister     Social History   Tobacco Use  . Smoking status: Former Smoker    Quit date: 07/23/1980    Years since quitting: 39.4  . Smokeless tobacco: Never Used  Substance Use Topics  . Alcohol use: Yes    Alcohol/week: 1.0 standard drinks    Types: 1 Cans of beer per week    Comment: rare  . Drug use: No    Home Medications Prior to Admission medications   Medication Sig Start Date End Date Taking? Authorizing Provider  allopurinol (ZYLOPRIM) 100 MG tablet Take  1 tablet (100 mg total) by mouth daily. 05/08/19  Yes Angiulli, Lavon Paganini, PA-C  amiodarone (PACERONE) 200 MG tablet Take 200 mg by mouth daily. 12/27/19  Yes [provider]  aspirin EC 81 MG EC tablet Take 1 tablet (81 mg total) by mouth daily. 05/01/19  Yes Conte, Tessa N, PA-C  bisoprolol (ZEBETA) 5 MG tablet TAKE 1 TABLET AS DIRECTED ON NON DIALYSIS TUES,THURS,SAT,SUN NEEDS APPT Patient taking differently: Take 5 mg by mouth See admin instructions. Take on non dialysis days (tues,thurs,sat,sun) 11/26/19  Yes Larey Dresser, MD  doxycycline (VIBRAMYCIN) 100 MG capsule Take 100 mg by mouth 2 (two) times daily. 12/23/19  Yes [provider]  ELIQUIS 5 MG TABS tablet TAKE 1 TABLET BY MOUTH TWICE A DAY Patient taking differently: Take 5 mg by mouth 2 (two) times daily.  10/21/19  Yes Larey Dresser, MD  ferric citrate (AURYXIA) 1 GM 210 MG(Fe) tablet Take 420 mg by mouth 3 (three) times  daily with meals.    Yes [provider]  fluticasone (FLONASE) 50 MCG/ACT nasal spray Place 1 spray into both nostrils daily as needed for allergies or rhinitis.   Yes [provider]  gabapentin (NEURONTIN) 100 MG capsule TAKE 1 CAPSULE AT BEDTIME ON HD DAYS (M,W,F) Patient taking differently: Take 100 mg by mouth See admin instructions. TAKE AT BEDTIME ON HD DAYS (M,W,F) 12/17/19  Yes [name], MD  HYDROcodone-acetaminophen (NORCO/VICODIN) 5-325 MG tablet Take 1 tablet by mouth every 6 (six) hours as needed for moderate pain. 12/18/19  Yes [name], MD  midodrine (PROAMATINE) 10 MG tablet Take 1 tablet (10 mg total) by mouth every Monday, Wednesday, and Friday with hemodialysis. 05/08/19  Yes Angiulli, Lavon Paganini, PA-C  multivitamin (RENA-VIT) TABS tablet Take 1 tablet by mouth at bedtime. 05/07/19  Yes Angiulli, Lavon Paganini, PA-C  ondansetron (ZOFRAN-ODT) 8 MG disintegrating tablet Take 1 tablet (8 mg total) by mouth every 8 (eight) hours as needed for nausea or vomiting. 10/20/19  Yes [name], MD  pantoprazole (PROTONIX) 40 MG tablet Take 40 mg by mouth daily as needed (acid reflux).    Yes [provider]  rosuvastatin (CRESTOR) 10 MG tablet Take 1 tablet (10 mg total) by mouth daily. 09/29/19 12/28/19 Yes Larey Dresser, MD  cinacalcet (SENSIPAR) 30 MG tablet Take 1 tablet (30 mg total) by mouth daily with supper. Patient not taking: Reported on 12/28/2019 05/22/19   Lajuana Matte, MD    Allergies    Codeine phosphate  Review of Systems   Review of Systems  Constitutional: Negative for chills and fever.  HENT: Negative for ear pain and sore throat.   Eyes: Negative for pain and visual disturbance.  Respiratory: Positive for shortness of breath. Negative for cough.   Cardiovascular: Positive for chest pain. Negative for palpitations.  Gastrointestinal: Negative for abdominal pain and vomiting.  Genitourinary: Negative for dysuria and  hematuria.  Musculoskeletal: Negative for arthralgias and back pain.  Skin: Positive for color change. Negative for rash.  Neurological: Positive for weakness. Negative for dizziness, seizures, syncope and headaches.  Psychiatric/Behavioral: Negative for confusion.  All other systems reviewed and are negative.   Physical Exam Updated Vital Signs BP 124/75   Pulse 90   Temp 97.6 F (36.4 C) (Oral)   Resp 16   Ht 6' (1.829 m)   Wt 135.2 kg   SpO2 97%   BMI 40.42 kg/m   Physical Exam Vitals reviewed.  Constitutional:      General: He is not in acute distress.    Appearance: Normal appearance. He is obese. He is not ill-appearing, toxic-appearing or diaphoretic.  HENT:     Head: Normocephalic and atraumatic.  Eyes:     General:        Right eye: No discharge.        Left eye: No discharge.     Extraocular Movements: Extraocular movements intact.     Conjunctiva/sclera: Conjunctivae normal.     Pupils: Pupils are equal, round, and reactive to light.  Cardiovascular:     Rate and Rhythm: Normal rate and regular rhythm.     Pulses:          Radial pulses are 2+ on the right side and 2+ on the left side.       Dorsalis pedis pulses are 2+ on the right side and 2+ on the left side.     Heart sounds: Normal heart sounds.  Pulmonary:     Effort: Pulmonary effort is normal. No tachypnea.     Breath sounds: Normal breath sounds.     Comments: Lungs clear Chest:     Chest wall: No tenderness.  Abdominal:     Palpations: Abdomen is soft.  Musculoskeletal:        General: No swelling. Normal range of motion.     Cervical back: Normal range of motion.     Right lower leg: Tenderness present. Edema present.     Comments: Right lower extremity with distinct marked erythema on the medial aspect of his shin and calf.  There is a raised nonfluctuant indurated area without evidence of abscess, insect bite, injury.  Right lower extremity slightly warm to the touch with 2+ edema.  2+ DP  pulses bilaterally.<2 cap refill.  Patient moving all 4 extremities without difficulty.  Skin:    General: Skin is warm.     Capillary Refill: Capillary refill takes less than 2 seconds.     Findings: Erythema present.  Neurological:     General: No focal deficit present.     Mental Status: He is alert and oriented to person, place, and time.     Cranial Nerves: No cranial nerve deficit.     Motor: No weakness.  Psychiatric:        Mood and Affect: Mood normal.        Behavior: Behavior normal.          ED Results / Procedures / Treatments   Labs (all labs ordered are listed, but only abnormal results are displayed) Labs Reviewed  CBC WITH DIFFERENTIAL/PLATELET - Abnormal; Notable for the following components:      Result Value   RBC 4.12 (*)    Hemoglobin 11.0 (*)    HCT 36.0 (*)    RDW 19.2 (*)    All other components within normal limits  COMPREHENSIVE METABOLIC PANEL - Abnormal; Notable for the following components:   Potassium 5.8 (*)    CO2 19 (*)    BUN 77 (*)    Creatinine, Ser 13.80 (*)    Total Protein 6.2 (*)    Albumin 2.7 (*)    GFR calc non Af Amer 3 (*)    GFR calc Af Amer 4 (*)    Anion gap 18 (*)    All other components within normal limits  TROPONIN I (HIGH SENSITIVITY) - Abnormal; Notable for the following components:   Troponin I (High Sensitivity) 47 (*)  All other components within normal limits  TROPONIN I (HIGH SENSITIVITY) - Abnormal; Notable for the following components:   Troponin I (High Sensitivity) 43 (*)    All other components within normal limits  SARS CORONAVIRUS 2 BY RT PCR (HOSPITAL ORDER, [facility])  CULTURE, BLOOD (ROUTINE X 2)  CULTURE, BLOOD (ROUTINE X 2)  HIV ANTIBODY (ROUTINE TESTING W REFLEX)    EKG EKG Interpretation  Date/Time:  Monday December 28 2019 07:09:30 EDT Ventricular Rate:  59 PR Interval:    QRS Duration: 102 QT Interval:  441 QTC Calculation: 437 R Axis:   71 Text  Interpretation: Sinus rhythm Anteroseptal infarct, age indeterminate No sig change from March 2021 ecg Confirmed by Octaviano Glow [phone]) on 12/28/2019 7:14:35 AM   Radiology DG Chest Portable 1 View  Result Date: 12/28/2019 CLINICAL DATA:  Pt arrived from home via [facility] c/o of chest pressure starting at approx 3am when he got up from bed to use the bathroom. Bilateral lower extremity swelling and redness EXAM: PORTABLE CHEST 1 VIEW COMPARISON:  05/22/2019 FINDINGS: Stable changes from prior CABG surgery. Cardiac silhouette is mildly enlarged. No mediastinal or masses. There are prominent bronchovascular markings. Lungs otherwise clear. No pleural effusion or pneumothorax. Skeletal structures are grossly intact IMPRESSION: No active disease. Electronically Signed   By: Lajean Manes M.D.   On: 12/28/2019 08:09   VAS [place] LOWER EXTREMITY VENOUS (DVT) ([facility] 7a-7p)  Result Date: 12/28/2019  Lower Venous DVTStudy Indications: Edema, Erythema, and currently on doxycycline.  Limitations: Body habitus and poor ultrasound/tissue interface. Comparison Study: No prior study Performing Technologist: [name] MHA, RDMS, RVT, RDCS  Examination Guidelines: A complete evaluation includes B-mode imaging, spectral Doppler, color Doppler, and power Doppler as needed of all accessible portions of each vessel. Bilateral testing is considered an integral part of a complete examination. Limited examinations for reoccurring indications may be performed as noted. The reflux portion of the exam is performed with the patient in reverse Trendelenburg.  +---------+---------------+---------+-----------+----------+--------------+ RIGHT    CompressibilityPhasicitySpontaneityPropertiesThrombus Aging +---------+---------------+---------+-----------+----------+--------------+ CFV      Full           No       Yes                                  +---------+---------------+---------+-----------+----------+--------------+ SFJ      Full                                                        +---------+---------------+---------+-----------+----------+--------------+ FV Prox  Full                                                        +---------+---------------+---------+-----------+----------+--------------+ FV Mid   Full                                                        +---------+---------------+---------+-----------+----------+--------------+ FV DistalFull                                                        +---------+---------------+---------+-----------+----------+--------------+  PFV      Full                                                        +---------+---------------+---------+-----------+----------+--------------+ POP      Full           No       Yes                                 +---------+---------------+---------+-----------+----------+--------------+ PTV      Full                                                        +---------+---------------+---------+-----------+----------+--------------+   Right Technical Findings: Not visualized segments include Peroneal veins.  +----+---------------+---------+-----------+----------+--------------+ LEFTCompressibilityPhasicitySpontaneityPropertiesThrombus Aging +----+---------------+---------+-----------+----------+--------------+ CFV Full           No       Yes                                 +----+---------------+---------+-----------+----------+--------------+     Summary: RIGHT: - There is no evidence of deep vein thrombosis in the lower extremity. However, portions of this examination were limited- see technologist comments above.  - No cystic structure found in the popliteal fossa.  LEFT: - No evidence of common femoral vein obstruction.  Lower extremity venous flow is pulsatile, suggestive of possibly elevated right heart  pressure.  *See table(s) above for measurements and observations.    Preliminary     Procedures Procedures (including critical care time)  Medications Ordered in ED Medications  aspirin EC tablet 81 mg (has no administration in time range)  amiodarone (PACERONE) tablet 200 mg (has no administration in time range)  bisoprolol (ZEBETA) tablet 5 mg (has no administration in time range)  pantoprazole (PROTONIX) EC tablet 40 mg (has no administration in time range)  rosuvastatin (CRESTOR) tablet 10 mg (has no administration in time range)  midodrine (PROAMATINE) tablet 10 mg (has no administration in time range)  multivitamin (RENA-VIT) tablet 1 tablet (has no administration in time range)  senna-docusate (Senokot-S) tablet 1 tablet (has no administration in time range)  sodium zirconium cyclosilicate (LOKELMA) packet 5 g (has no administration in time range)  allopurinol (ZYLOPRIM) tablet 100 mg (has no administration in time range)  ferric citrate (AURYXIA) tablet 420 mg (has no administration in time range)  vancomycin (VANCOCIN) 2,500 mg in sodium chloride 0.9 % 500 mL IVPB (has no administration in time range)  vancomycin (VANCOCIN) IVPB 1000 mg/200 mL premix (has no administration in time range)    ED Course  I have reviewed the triage vital signs and the nursing notes.  Pertinent labs & imaging results that were available during my care of the patient were reviewed by me and considered in my medical decision making (see chart for details).  Clinical Course as of Dec 28 1351  Mon Dec 28, 2019  0807 69 yo male w/ ESRD on dialysis MWF, who does produce urine, presenting to the ED with right leg redness, chest pain and  SOB.  Reports he missed dialysis Friday to go see his PCP due to right leg pain and redness.  He reports painful firm lump in his right calf that had "gotten softer," and states he had been started on doxycycline for possible cellulitis on Thursday (5 days ago), so his PCP  did not initiate new antibiotics.  He feels the redness has been "about the same" in his leg.  He denies hx of DVT or PE.  This morning at 0300 he woke up to urinate and began noticing CP and SOB, worse with lying flat, and relieved with sitting upright.  The pain is "dull pressure" in his left lower sternum, and does not radiate.  Currently he is pain free.  On exam he has crackles in lower lobes only, stable on room air, breathing comfortably, and has a region of circumferential erythema involving the RLL with a region of induration in the right calf.  Pulses intact.  Plan for DVT ultrasound, trend troponins, check dilaysis labs.  PE remains on differential for now, although he appears stable without any evidence of respiratory or cardiac compromise.  He is also on eliquis 5 mg BID and has been taking it regularly.  (Note he is also on midodrine daily for hypotension).   [MT]  0945   Summary: RIGHT: - There is no evidence of deep vein thrombosis in the lower extremity. However, portions of this examination were limited- see technologist comments above.  - No cystic structure found in the popliteal fossa.    [MT]  1127 Unable to obtain CT PE given kidney function, I'll plan for admission after trending 2nd trop for Nuclear medicine PE scan.  DVT ultrasound had limited view of popliteal region.  Blood clot remains on the differential.  Patient will also need non-emergent dialysis today or tomorrow while in the hospital.     [MT]  1202 2nd trop flat   [MT]    Clinical Course User Index [MT] Wyvonnia Dusky, MD   MDM Rules/Calculators/A&P                     69 year old with chest pressure, shortness of breath that began at 4 AM. On presentation to the ER, the patient is alert and oriented, nontoxic-appearing, in no respiratory distress.  Vitals are overall reassuring.  Physical exam with demarcated erythema to the right lower extremity, with raised nonfluctuant indurated mass.  DDx includes  ACS, PE as the patient did have a CABG approximately 3 months ago.  He is anticoagulated on Eliquis and aspirin.  Basic labs, chest x-ray, trend troponin, DVT ultrasound.  May consider CT PE, though suspect that his renal function may not allow this.  10:30 AM: CMP with mild hyperkalemia 5.8.  EKG normal sinus rhythm.  Chest x-ray without acute cardiopulmonary abnormalities.  No evidence of fluid overload.  CBC without leukocytosis, mildly decreased hemoglobin of 11, likely noncontributory.  Delta troponin pending.  11:40 AM: DVT study without clear evidence of thrombosis.   Patient still makes urine, thus he would not be a good candidate for CT PE study.  Discussed plan of care with the patient and he is amenable to admission for nonemergent dialysis and possible VQ study.  DVT/PE remains on the differential.  Initial troponin 47, will trend.  12:35 PM: Deltra troponin stable.   Spoke with Dr. Dareen Piano with hospitalist team, patient will be admitted for nonemergent dialysis and possible VQ scan to rule out PE.  He has remained  hemodynamically stable throughout the ED course.  Patient was seen and evaluated by Dr. Langston Masker and he is agreeable to the above plan. Final Clinical Impression(s) / ED Diagnoses Final diagnoses:  Chest pain, unspecified type  Shortness of breath  Abscess    Rx / DC Orders ED Discharge Orders    None       Garald Balding, PA-C 12/28/19 1353    Garald Balding, PA-C 12/28/19 1451    Wyvonnia Dusky, MD 12/28/19 1723

## 2019-12-29 ENCOUNTER — Observation Stay (HOSPITAL_COMMUNITY): Payer: Medicare Other

## 2019-12-29 DIAGNOSIS — L03115 Cellulitis of right lower limb: Secondary | ICD-10-CM

## 2019-12-29 DIAGNOSIS — L02415 Cutaneous abscess of right lower limb: Secondary | ICD-10-CM | POA: Diagnosis not present

## 2019-12-29 DIAGNOSIS — E8779 Other fluid overload: Secondary | ICD-10-CM | POA: Diagnosis not present

## 2019-12-29 DIAGNOSIS — N186 End stage renal disease: Secondary | ICD-10-CM

## 2019-12-29 DIAGNOSIS — Z992 Dependence on renal dialysis: Secondary | ICD-10-CM

## 2019-12-29 HISTORY — PX: IR US GUIDE BX ASP/DRAIN: IMG2392

## 2019-12-29 LAB — RENAL FUNCTION PANEL
Albumin: 2.7 g/dL — ABNORMAL LOW (ref 3.5–5.0)
Anion gap: 14 (ref 5–15)
BUN: 37 mg/dL — ABNORMAL HIGH (ref 8–23)
CO2: 27 mmol/L (ref 22–32)
Calcium: 8.6 mg/dL — ABNORMAL LOW (ref 8.9–10.3)
Chloride: 98 mmol/L (ref 98–111)
Creatinine, Ser: 8.52 mg/dL — ABNORMAL HIGH (ref 0.61–1.24)
GFR calc Af Amer: 7 mL/min — ABNORMAL LOW (ref >60)
GFR calc non Af Amer: 6 mL/min — ABNORMAL LOW (ref >60)
Glucose, Bld: 77 mg/dL (ref 70–99)
Phosphorus: 4.6 mg/dL (ref 2.5–4.6)
Potassium: 4.5 mmol/L (ref 3.5–5.1)
Sodium: 139 mmol/L (ref 135–145)

## 2019-12-29 LAB — CBC WITH DIFFERENTIAL/PLATELET
Abs Immature Granulocytes: 0.04 10*3/uL (ref 0.00–0.07)
Basophils Absolute: 0.1 10*3/uL (ref 0.0–0.1)
Basophils Relative: 1 %
Eosinophils Absolute: 0.2 10*3/uL (ref 0.0–0.5)
Eosinophils Relative: 3 %
HCT: 34 % — ABNORMAL LOW (ref 39.0–52.0)
Hemoglobin: 10.3 g/dL — ABNORMAL LOW (ref 13.0–17.0)
Immature Granulocytes: 1 %
Lymphocytes Relative: 22 %
Lymphs Abs: 1.6 10*3/uL (ref 0.7–4.0)
MCH: 26.5 pg (ref 26.0–34.0)
MCHC: 30.3 g/dL (ref 30.0–36.0)
MCV: 87.6 fL (ref 80.0–100.0)
Monocytes Absolute: 0.7 10*3/uL (ref 0.1–1.0)
Monocytes Relative: 10 %
Neutro Abs: 4.7 10*3/uL (ref 1.7–7.7)
Neutrophils Relative %: 63 %
Platelets: 155 10*3/uL (ref 150–400)
RBC: 3.88 MIL/uL — ABNORMAL LOW (ref 4.22–5.81)
RDW: 19.3 % — ABNORMAL HIGH (ref 11.5–15.5)
WBC: 7.3 10*3/uL (ref 4.0–10.5)
nRBC: 0 % (ref 0.0–0.2)

## 2019-12-29 LAB — MAGNESIUM: Magnesium: 1.8 mg/dL (ref 1.7–2.4)

## 2019-12-29 MED ORDER — CALCITRIOL 0.25 MCG PO CAPS: 0.2500 ug | ORAL_CAPSULE | ORAL | 0 refills | Status: DC

## 2019-12-29 MED ORDER — LIDOCAINE HCL 1 % IJ SOLN
INTRAMUSCULAR | Status: AC
  Filled 2019-12-29: qty 20

## 2019-12-29 MED ORDER — CHLORHEXIDINE GLUCONATE CLOTH 2 % EX PADS: 6.0000 | MEDICATED_PAD | Freq: Every day | CUTANEOUS | Status: DC

## 2019-12-29 MED ORDER — LIDOCAINE HCL (PF) 1 % IJ SOLN
INTRAMUSCULAR | Status: DC | PRN
  Administered 2019-12-29: 10 mL

## 2019-12-29 MED ORDER — CHLORHEXIDINE GLUCONATE CLOTH 2 % EX PADS
6.0000 | MEDICATED_PAD | Freq: Every day | CUTANEOUS | Status: DC
  Administered 2019-12-29: 6 via TOPICAL

## 2019-12-29 MED ORDER — VANCOMYCIN HCL IN DEXTROSE 1-5 GM/200ML-% IV SOLN
INTRAVENOUS | Status: AC
  Filled 2019-12-29: qty 200

## 2019-12-29 MED ORDER — DOXYCYCLINE HYCLATE 100 MG PO TABS
100.0000 mg | ORAL_TABLET | Freq: Two times a day (BID) | ORAL | 0 refills | Status: DC
Start: 2019-12-29 — End: 2020-01-28

## 2019-12-29 MED ORDER — HYDROMORPHONE HCL 1 MG/ML IJ SOLN
INTRAMUSCULAR | Status: AC
  Filled 2019-12-29: qty 0.5

## 2019-12-29 MED ORDER — HEPARIN SODIUM (PORCINE) 1000 UNIT/ML IJ SOLN
INTRAMUSCULAR | Status: AC
  Administered 2019-12-29: 2000 [IU]
  Filled 2019-12-29: qty 2

## 2019-12-29 MED ORDER — CALCITRIOL 0.25 MCG PO CAPS: 0.2500 ug | ORAL_CAPSULE | ORAL | Status: DC

## 2019-12-29 NOTE — Progress Notes (Signed)
Patient refused CPAP tonight and will have family bring in his mask if he is staying more nights and may wear then.

## 2019-12-29 NOTE — Progress Notes (Signed)
Patient arrived back to room from IR.

## 2019-12-29 NOTE — Progress Notes (Addendum)
[facility] Progress Note   Dialysis Orders: MWF - [facility] [address] [facility]  5hrs, BFR 400, DFR 800,  EDW 134kg, 2K/ 2Ca Access: RU AVF  Heparin 2000 Venofer 100mg  IV qHD x10, (3 completed) Sensipar 180mg  qHD   Assessment/Plan: 1. Right calf abscess with cellulitis - Vanc started. Radiology consulted for aspiration. Seen by Ortho . On Vanc BC no growth. 2. Chest pain - s/p CABG in March. Now resolved. Flat troponin's. Per primary 3. Volume overload - 2/2 non compliance.  CXR with no acute findings.   Intolerant of large UF only achieved 1.8 of 4 yesterday - reports BP drops at outpatient unit several hours into treatment NEVER runs 5 hr . Drives self to dialysis. Lives alone. 4. ESRD -  On HD MWF. Hx non compliance.  Last HD 6/2.  K 5.8.> 4.5  Plan 2 hr HD for volume today and back on schedule tomorrow - Tolerance to UF may improve with correction of infection. Would increase BFR of AVF as tolerated to increase kinetics as he habitually shortens HD. 5. Hypotension - BP soft, on midodrine with HD. - 4 L net UF ordered - only achieved 1.8 net UF - post wt 138,8 - well above - edw - order extra tmt today and back on schedule tomorrow 6. Anemia of CKD - Hgb 11.0> 10.3 .  No indication for ESA at this time. Hold on venofer for now 7. Secondary Hyperparathyroidism -  P in goal --always elevated at home HD Not on VDRA- previously on 0.25 calcitriol - had been held for ^ Ca -- resume  iPTH 1500 Continue sensipar.  8. Nutrition - Renal diet w/fluid restrictions.   Myriam Jacobson, PA-C [place] 12/29/2019,9:28 AM  LOS: 0 days   Subjective:   For drain of right leg by ortho under anesthesia- no problems with eating  Objective Vitals:   12/29/19 0430 12/29/19 0439 12/29/19 0515 12/29/19 0815  BP: (!) 110/55 (!) 104/53 118/72 129/60  Pulse: 64 64 66 64  Resp:  20 20   Temp:  98 F (36.7 C) 98.3 F (36.8 C)   TempSrc:  Oral Oral   SpO2:  96%  98%   Weight:  (!) 138.8 kg (!) 140.4 kg   Height:       Physical Exam General: NAD sitting in side of the bed Heart: RRR Lungs: no rales Abdomen: obese soft  Extremities: R>>L LE edema - right below knee marked erythema and tenderness especially in posterior lower leg Dialysis Access:  Left AVF + bruit   Additional Objective Labs: Basic Metabolic Panel: Recent Labs  Lab 12/28/19 0824 12/29/19 0726  NA 138 139  K 5.8* 4.5  CL 101 98  CO2 19* 27  GLUCOSE 77 77  BUN 77* 37*  CREATININE 13.80* 8.52*  CALCIUM 8.9 8.6*  PHOS  --  4.6   Liver Function Tests: Recent Labs  Lab 12/28/19 0824 12/29/19 0726  AST 24  --   ALT 32  --   ALKPHOS 116  --   BILITOT 1.1  --   PROT 6.2*  --   ALBUMIN 2.7* 2.7*   No results for input(s): LIPASE, AMYLASE in the last 168 hours. CBC: Recent Labs  Lab 12/28/19 0824 12/29/19 0726  WBC 8.9 7.3  NEUTROABS 5.6 4.7  HGB 11.0* 10.3*  HCT 36.0* 34.0*  MCV 87.4 87.6  PLT 206 155   Blood Culture    Component Value Date/Time  SDES BLOOD LEFT ANTECUBITAL 12/28/2019 1707   SPECREQUEST  12/28/2019 1707    BOTTLES DRAWN AEROBIC ONLY Blood Culture adequate volume   CULT  12/28/2019 1707    NO GROWTH < 24 HOURS Performed at [facility], [place] [address] [place], [place] [address]    REPTSTATUS PENDING 12/28/2019 1707    Cardiac Enzymes: No results for input(s): CKTOTAL, CKMB, CKMBINDEX, TROPONINI in the last 168 hours. CBG: No results for input(s): GLUCAP in the last 168 hours. Iron Studies: No results for input(s): IRON, TIBC, TRANSFERRIN, FERRITIN in the last 72 hours. Lab Results  Component Value Date   INR 1.5 (H) 04/16/2019   INR 1.2 04/16/2019   INR 1.5 05/30/2007   Studies/Results: DG Tibia/Fibula Right  Result Date: 12/28/2019 CLINICAL DATA:  Right lower leg redness and swelling. EXAM: RIGHT TIBIA AND FIBULA - 2 VIEW COMPARISON:  None. FINDINGS: No fracture or bone lesion. No bone resorption to suggest  osteomyelitis. Degenerative changes involve both the knee and ankle joints, with joint space narrowing most evident medial compartment of the knee. There is diffuse soft tissue swelling. Surgical vascular clips are noted along the medial leg consistent with previous vein harvesting procedure. No soft tissue air. IMPRESSION: 1. No fracture, bone lesion or evidence of osteomyelitis. 2. Osteoarthritic changes of the knee and ankle joints. 3. Nonspecific soft tissue swelling.  No soft tissue air. Electronically Signed   By: Lajean Manes M.D.   On: 12/28/2019 14:53   MR TIBIA FIBULA RIGHT WO CONTRAST  Result Date: 12/28/2019 CLINICAL DATA:  Right lower leg redness and swelling. Evaluate for abscess. EXAM: MRI OF LOWER RIGHT EXTREMITY WITHOUT CONTRAST TECHNIQUE: Multiplanar, multisequence MR imaging of the right lower leg was performed. No intravenous contrast was administered. COMPARISON:  Right tibia fibula x-rays from same day. FINDINGS: Examination is limited to coronal stir and coronal and axial T1 sequences as the patient terminated the exam early due to pain. Bones/Joint/Cartilage No suspicious marrow signal abnormality. No acute fracture or dislocation. Old trauma at the tip of the right lateral malleolus. Advanced osteoarthritis of the right knee medial compartment and right tibiotalar joint. Prior left knee arthroplasty. No joint effusion. Ligaments Collateral ligaments are intact. Muscles and Tendons Flexor, peroneal, extensor, and Achilles tendons are intact. Relatively symmetric mild diffuse edema and fatty infiltration of the bilateral lower leg muscles, nonspecific, but potentially related to denervation. Soft tissue Asymmetric right greater than left lower leg circumferential subcutaneous edema. 2.1 x 1.2 x 3.7 cm fluid collection in the posteromedial proximal lower leg (series 13, image 31; series 9, image 19). No soft tissue mass. IMPRESSION: 1. Limited study. The patient terminated the exam early  due to pain. 2. Right greater than left lower leg subcutaneous edema, concerning for right lower leg cellulitis superimposed on bilateral venous insufficiency versus third spacing. 2.1 x 1.2 x 3.7 cm fluid collection in the right posteromedial proximal lower leg may represent a small abscess. 3. No acute osseous abnormality. Electronically Signed   By: Titus Dubin M.D.   On: 12/28/2019 19:35   DG Chest Portable 1 View  Result Date: 12/28/2019 CLINICAL DATA:  Pt arrived from home via [facility] c/o of chest pressure starting at approx 3am when he got up from bed to use the bathroom. Bilateral lower extremity swelling and redness EXAM: PORTABLE CHEST 1 VIEW COMPARISON:  05/22/2019 FINDINGS: Stable changes from prior CABG surgery. Cardiac silhouette is mildly enlarged. No mediastinal or masses. There are prominent bronchovascular markings. Lungs otherwise  clear. No pleural effusion or pneumothorax. Skeletal structures are grossly intact IMPRESSION: No active disease. Electronically Signed   By: Lajean Manes M.D.   On: 12/28/2019 08:09   VAS [place] LOWER EXTREMITY VENOUS (DVT) ([facility] [place] 7a-7p)  Result Date: 12/28/2019  Lower Venous DVTStudy Indications: Edema, Erythema, and currently on doxycycline.  Limitations: Body habitus and poor ultrasound/tissue interface. Comparison Study: No prior study Performing Technologist: [name] MHA, RDMS, RVT, RDCS  Examination Guidelines: A complete evaluation includes B-mode imaging, spectral Doppler, color Doppler, and power Doppler as needed of all accessible portions of each vessel. Bilateral testing is considered an integral part of a complete examination. Limited examinations for reoccurring indications may be performed as noted. The reflux portion of the exam is performed with the patient in reverse Trendelenburg.  +---------+---------------+---------+-----------+----------+--------------+ RIGHT    CompressibilityPhasicitySpontaneityPropertiesThrombus  Aging +---------+---------------+---------+-----------+----------+--------------+ CFV      Full           No       Yes                                 +---------+---------------+---------+-----------+----------+--------------+ SFJ      Full                                                        +---------+---------------+---------+-----------+----------+--------------+ FV Prox  Full                                                        +---------+---------------+---------+-----------+----------+--------------+ FV Mid   Full                                                        +---------+---------------+---------+-----------+----------+--------------+ FV DistalFull                                                        +---------+---------------+---------+-----------+----------+--------------+ PFV      Full                                                        +---------+---------------+---------+-----------+----------+--------------+ POP      Full           No       Yes                                 +---------+---------------+---------+-----------+----------+--------------+ PTV      Full                                                        +---------+---------------+---------+-----------+----------+--------------+  Right Technical Findings: Not visualized segments include Peroneal veins.  +----+---------------+---------+-----------+----------+--------------+ LEFTCompressibilityPhasicitySpontaneityPropertiesThrombus Aging +----+---------------+---------+-----------+----------+--------------+ CFV Full           No       Yes                                 +----+---------------+---------+-----------+----------+--------------+     Summary: RIGHT: - There is no evidence of deep vein thrombosis in the lower extremity. However, portions of this examination were limited- see technologist comments above.  - No cystic structure found in the  popliteal fossa.  LEFT: - No evidence of common femoral vein obstruction.  Lower extremity venous flow is pulsatile, suggestive of possibly elevated right heart pressure.  *See table(s) above for measurements and observations. Electronically signed by Curt Jews MD on 12/28/2019 at 7:04:54 PM.    Final    Medications: . [START ON 12/30/2019] vancomycin     . allopurinol  100 mg Oral Daily  . amiodarone  200 mg Oral Daily  . aspirin EC  81 mg Oral Daily  . bisoprolol  5 mg Oral Q T,Th,S,Su  . Chlorhexidine Gluconate Cloth  6 each Topical Q0600  . ferric citrate  420 mg Oral TID WC  . [START ON 12/30/2019] midodrine  10 mg Oral Q M,W,F-HD  . multivitamin  1 tablet Oral QHS  . rosuvastatin  10 mg Oral Daily

## 2019-12-29 NOTE — Progress Notes (Signed)
HD#0 Subjective:  Overnight Events: Patient had HD overnight.   Alex Williams states that he is doing well today. HE continues to have pain on his right lower extremity. His breahting has improved since dialysis.   Objective:  Vital signs in last 24 hours: Vitals:   12/29/19 0415 12/29/19 0430 12/29/19 0439 12/29/19 0515  BP: (!) 113/51 (!) 110/55 (!) 104/53 118/72  Pulse: 70 64 64 66  Resp:   20 20  Temp:   98 F (36.7 C) 98.3 F (36.8 C)  TempSrc:   Oral Oral  SpO2:   96% 98%  Weight:   (!) 138.8 kg (!) 140.4 kg  Height:       Supplemental O2: RA SpO2: 98 %   Physical Exam:  Physical Exam  Constitutional: He is oriented to person, place, and time and well-developed, well-nourished, and in no distress.  HENT:  Head: Normocephalic and atraumatic.  Eyes: EOM are normal.  Neck: No tracheal deviation present.  Cardiovascular: Normal rate and intact distal pulses. Exam reveals no gallop and no friction rub.  No murmur heard. Pulmonary/Chest: Effort normal and breath sounds normal. No respiratory distress.  Abdominal: Soft. Bowel sounds are normal. He exhibits no distension. There is no abdominal tenderness.  Musculoskeletal:        General: Edema present. No tenderness. Normal range of motion.  Neurological: He is alert and oriented to person, place, and time.  Skin: Skin is warm and dry. There is erythema (right lower extremity).    Filed Weights   12/29/19 0017 12/29/19 0439 12/29/19 0515  Weight: (!) 140.6 kg (!) 138.8 kg (!) 140.4 kg     Intake/Output Summary (Last 24 hours) at 12/29/2019 0603 Last data filed at 12/29/2019 0439 Gross per 24 hour  Intake 860 ml  Output 1800 ml  Net -940 ml   Net IO Since Admission: -940 mL [12/29/19 0603]  Pertinent Labs: CBC Latest Ref Rng & Units 12/28/2019 10/08/2019 09/29/2019  WBC 4.0 - 10.5 K/uL 8.9 - 6.3  Hemoglobin 13.0 - 17.0 g/dL 11.0(L) 9.2(L) 8.3(L)  Hematocrit 39.0 - 52.0 % 36.0(L) 27.0(L) 28.0(L)  Platelets 150  - 400 K/uL 206 - 165    CMP Latest Ref Rng & Units 12/28/2019 10/08/2019 09/29/2019  Glucose 70 - 99 mg/dL 77 79 92  BUN 8 - 23 mg/dL 77(H) 29(H) 35(H)  Creatinine 0.61 - 1.24 mg/dL 13.80(H) 7.00(H) 7.95(H)  Sodium 135 - 145 mmol/L 138 136 140  Potassium 3.5 - 5.1 mmol/L 5.8(H) 4.3 5.0  Chloride 98 - 111 mmol/L 101 95(L) 97(L)  CO2 22 - 32 mmol/L 19(L) - 30  Calcium 8.9 - 10.3 mg/dL 8.9 - 9.1  Total Protein 6.5 - 8.1 g/dL 6.2(L) - 5.8(L)  Total Bilirubin 0.3 - 1.2 mg/dL 1.1 - 0.8  Alkaline Phos 38 - 126 U/L 116 - 79  AST 15 - 41 U/L 24 - 13(L)  ALT 0 - 44 U/L 32 - 13    Pending Labs: none  Imaging:  MRI of right leg: 1. Limited study. The patient terminated the exam early due to pain. 2. Right greater than left lower leg subcutaneous edema, concerning for right lower leg cellulitis superimposed on bilateral venous insufficiency versus third spacing. 2.1 x 1.2 x 3.7 cm fluid collection in the right posteromedial proximal lower leg may represent a small abscess. 3. No acute osseous abnormality.  Assessment/Plan:   Active Problems:   Cellulitis of right lower extremity    has a  past medical history of Cervical disc disease, Chronic kidney disease, COLONIC POLYPS, HX OF (06/27/2007), EXOGENOUS OBESITY (01/30/2010), GERD (gastroesophageal reflux disease), GOUT (01/30/2010), HYPERCHOLESTEROLEMIA (06/30/2007), HYPERTENSION (06/27/2007), LOW BACK PAIN (06/27/2007), NEPHROLITHIASIS, HX OF (06/27/2007), OSTEOARTHRITIS (06/27/2007), SLEEP APNEA, OBSTRUCTIVE, MODERATE (01/27/2009), and Wears dentures.  Patient Summary: In summary, Alex Williams is a 69 y.o. male with a pertinent PMH of ESRD on dialysisproducing urine, CABG x3, STEMI, hypertension, GERD, who presented to the ED with acute onset shortness of breath and right left erythemal and edema, and was admitted for further evaluation and management.   #Shortness of breath: Resolved #ESRD: (HD yesterday) - Patient had HD last night.  - I  appreciate nephrologies assistance with this patient. - Continue midodrine 10 mg with HD.  #Right LE cellulitis with abscess:  - Continue AB. On day 2 of vancomycin dosed with HD.  - Appreciate orthos assistance with is patient. Recommends IR drainage.  - Likely discharge with oral antibiotics.   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  - Continue bisoprolol - Continue ASA 81 mg daily for 2/2 ppx. - Continue Cardiac monitoring and pulse ox  #Atrial fibrillation: Patient has a history of paroxysmal A fib on Eliquis. He states that he missed his dose this morning.  - Continue amiodarone. - Hold Eliquis due to need for I&D  #HTN #HLD: - Continue rosuvastatin 10 mg daily.   #OSA Wears home CPAP. CPAP ordered  Diet: NPO IVF: None,None VTE: Heparin Code: Full PT/OT recs: Pending TOC recs: none   Dispo: Anticipated discharge 1-2 days.    Marianna Payment, D.O. [facility], PGY-1 Date 12/29/2019 Time 6:03 AM Pager: [phone] Please contact the on call pager after 5 pm and on weekends at [phone].

## 2019-12-29 NOTE — Consult Note (Addendum)
Chief Complaint: Patient was seen in consultation today for right calf abscess aspiration Chief Complaint  Patient presents with  . Chest Pain   at the request of Dr Ephriam Jenkins   Supervising Physician: Aletta Edouard  Patient Status: [facility] - In-pt  History of Present Illness: Alex Williams is a 69 y.o. male   Right calf abscess Pt denies injury--- but noticed redness and swelling of right low leg about a week ago  Worsened and PMD placed pt on antibx for cellulitis  Still worsened and pt came to ED 12/28/19  Imaging MRI 6/7: IMPRESSION: 1. Limited study. The patient terminated the exam early due to pain. 2. Right greater than left lower leg subcutaneous edema, concerning for right lower leg cellulitis superimposed on bilateral venous insufficiency versus third spacing. 2.1 x 1.2 x 3.7 cm fluid collection in the right posteromedial proximal lower leg may represent a small abscess. 3. No acute osseous abnormality.  LD Eliquis 12/27/19  Imaging reviewed with Dr Kathlene Cote He approves aspiration of rt leg abscess   Past Medical History:  Diagnosis Date  . Cervical disc disease   . Chronic kidney disease   . COLONIC POLYPS, HX OF 06/27/2007  . EXOGENOUS OBESITY 01/30/2010  . GERD (gastroesophageal reflux disease)    PMH  . GOUT 01/30/2010  . HYPERCHOLESTEROLEMIA 06/30/2007  . HYPERTENSION 06/27/2007  . LOW BACK PAIN 06/27/2007  . NEPHROLITHIASIS, HX OF 06/27/2007  . OSTEOARTHRITIS 06/27/2007  . SLEEP APNEA, OBSTRUCTIVE, MODERATE 01/27/2009  . Wears dentures    upper    Past Surgical History:  Procedure Laterality Date  . AV FISTULA PLACEMENT Right 01/19/2019   Procedure: RIGHT BRACHIOCEPHALIC ARTERIOVENOUS (AV) FISTULA CREATION;  Surgeon: Angelia Mould, MD;  Location: [place];  Service: Vascular;  Laterality: Right;  . CARDIAC CATHETERIZATION    . CARPAL TUNNEL RELEASE     left hand  . COLONOSCOPY     with polypectomy  . CORONARY ARTERY BYPASS GRAFT N/A 04/16/2019   Procedure: CORONARY ARTERY BYPASS GRAFTING (CABG)X3  , WITH ENDOSCOPIC HARVESTING OF RIGHT GREATER SAPHENOUS VEIN;  Surgeon: Lajuana Matte, MD;  Location: [place];  Service: Open Heart Surgery;  Laterality: N/A;  . CORONARY/GRAFT ACUTE MI REVASCULARIZATION N/A 04/16/2019   Procedure: CORONARY/GRAFT ACUTE MI REVASCULARIZATION;  Surgeon: Burnell Blanks, MD;  Location: [place] [facility];  Service: Cardiovascular;  Laterality: N/A;  . INSERTION OF DIALYSIS CATHETER N/A 04/29/2019   Procedure: INSERTION OF TUNNELED DIALYSIS CATHETER, right internal jugular;  Surgeon: Rosetta Posner, MD;  Location: [place];  Service: Vascular;  Laterality: N/A;  . JOINT REPLACEMENT Left 2005   knee  . LUMBAR LAMINECTOMY    . MULTIPLE TOOTH EXTRACTIONS    . PERIPHERAL VASCULAR BALLOON ANGIOPLASTY Right 06/25/2019   Procedure: PERIPHERAL VASCULAR BALLOON ANGIOPLASTY;  Surgeon: Marty Heck, MD;  Location: [place] [facility];  Service: Cardiovascular;  Laterality: Right;  . RADIOFREQUENCY ABLATION KIDNEY    . RIGHT/LEFT HEART CATH AND CORONARY ANGIOGRAPHY N/A 04/16/2019   Procedure: RIGHT/LEFT HEART CATH AND CORONARY ANGIOGRAPHY;  Surgeon: Burnell Blanks, MD;  Location: [place] [facility];  Service: Cardiovascular;  Laterality: N/A;  . TEE WITHOUT CARDIOVERSION N/A 04/16/2019   Procedure: TRANSESOPHAGEAL ECHOCARDIOGRAM (TEE);  Surgeon: Lajuana Matte, MD;  Location: [place];  Service: Open Heart Surgery;  Laterality: N/A;  . TEE WITHOUT CARDIOVERSION N/A 10/08/2019   Procedure: TRANSESOPHAGEAL ECHOCARDIOGRAM (TEE);  Surgeon: Larey Dresser, MD;  Location: [facility];  Service:  Cardiovascular;  Laterality: N/A;  . TOTAL KNEE ARTHROPLASTY     left  . VENTRICULAR ASSIST DEVICE INSERTION N/A 04/16/2019   Procedure: VENTRICULAR ASSIST DEVICE INSERTION;  Surgeon: Burnell Blanks, MD;  Location: [place] [facility];  Service: Cardiovascular;  Laterality: N/A;    Allergies: Codeine  phosphate  Medications: Prior to Admission medications   Medication Sig Start Date End Date Taking? Authorizing Provider  allopurinol (ZYLOPRIM) 100 MG tablet Take 1 tablet (100 mg total) by mouth daily. 05/08/19  Yes Angiulli, Lavon Paganini, PA-C  amiodarone (PACERONE) 200 MG tablet Take 200 mg by mouth daily. 12/27/19  Yes [provider]  aspirin EC 81 MG EC tablet Take 1 tablet (81 mg total) by mouth daily. 05/01/19  Yes Conte, Tessa N, PA-C  bisoprolol (ZEBETA) 5 MG tablet TAKE 1 TABLET AS DIRECTED ON NON DIALYSIS TUES,THURS,SAT,SUN NEEDS APPT Patient taking differently: Take 5 mg by mouth See admin instructions. Take on non dialysis days (tues,thurs,sat,sun) 11/26/19  Yes Larey Dresser, MD  doxycycline (VIBRAMYCIN) 100 MG capsule Take 100 mg by mouth 2 (two) times daily. 12/23/19  Yes [provider]  ELIQUIS 5 MG TABS tablet TAKE 1 TABLET BY MOUTH TWICE A DAY Patient taking differently: Take 5 mg by mouth 2 (two) times daily.  10/21/19  Yes Larey Dresser, MD  ferric citrate (AURYXIA) 1 GM 210 MG(Fe) tablet Take 420 mg by mouth 3 (three) times daily with meals.    Yes [provider]  fluticasone (FLONASE) 50 MCG/ACT nasal spray Place 1 spray into both nostrils daily as needed for allergies or rhinitis.   Yes [provider]  gabapentin (NEURONTIN) 100 MG capsule TAKE 1 CAPSULE AT BEDTIME ON HD DAYS (M,W,F) Patient taking differently: Take 100 mg by mouth See admin instructions. TAKE AT BEDTIME ON HD DAYS (M,W,F) 12/17/19  Yes [name], MD  HYDROcodone-acetaminophen (NORCO/VICODIN) 5-325 MG tablet Take 1 tablet by mouth every 6 (six) hours as needed for moderate pain. 12/18/19  Yes [name], MD  midodrine (PROAMATINE) 10 MG tablet Take 1 tablet (10 mg total) by mouth every Monday, Wednesday, and Friday with hemodialysis. 05/08/19  Yes Angiulli, Lavon Paganini, PA-C  multivitamin (RENA-VIT) TABS tablet Take 1 tablet by mouth at bedtime. 05/07/19  Yes  Angiulli, Lavon Paganini, PA-C  ondansetron (ZOFRAN-ODT) 8 MG disintegrating tablet Take 1 tablet (8 mg total) by mouth every 8 (eight) hours as needed for nausea or vomiting. 10/20/19  Yes [name], MD  pantoprazole (PROTONIX) 40 MG tablet Take 40 mg by mouth daily as needed (acid reflux).    Yes [provider]  rosuvastatin (CRESTOR) 10 MG tablet Take 1 tablet (10 mg total) by mouth daily. 09/29/19 12/28/19 Yes Larey Dresser, MD  cinacalcet (SENSIPAR) 30 MG tablet Take 1 tablet (30 mg total) by mouth daily with supper. Patient not taking: Reported on 12/28/2019 05/22/19   Lajuana Matte, MD     Family History  Problem Relation Age of Onset  . Hypertension Other   . Diabetes Sister   . Hyperlipidemia Sister   . Sleep apnea Sister     Social History   Socioeconomic History  . Marital status: Legally Separated    Spouse name: Not on file  . Number of children: 2  . Years of education: Not on file  . Highest education level: Not on file  Occupational History  . Not on file  Tobacco Use  . Smoking status: Former Smoker  Quit date: 07/23/1980    Years since quitting: 39.4  . Smokeless tobacco: Never Used  Substance and Sexual Activity  . Alcohol use: Yes    Alcohol/week: 1.0 standard drinks    Types: 1 Cans of beer per week    Comment: rare  . Drug use: No  . Sexual activity: Not Currently  Other Topics Concern  . Not on file  Social History Narrative   Pt lives alone. He has one son who lives within 3 miles who he usually sees daily. He has two sisters living in the next town over who he also sees on a weekly basis. He is a religious man who attends church at least 3 Sundays each month.   He operates [employer] full time.    Social Determinants of Health   Financial Resource Strain:   . Difficulty of Paying Living Expenses:   Food Insecurity:   . Worried About Charity fundraiser in the Last Year:   . Arboriculturist in the Last Year:   Transportation  Needs:   . Film/video editor (Medical):   Marland Kitchen Lack of Transportation (Non-Medical):   Physical Activity:   . Days of Exercise per Week:   . Minutes of Exercise per Session:   Stress:   . Feeling of Stress :   Social Connections:   . Frequency of Communication with Friends and Family:   . Frequency of Social Gatherings with Friends and Family:   . Attends Religious Services:   . Active Member of Clubs or Organizations:   . Attends Archivist Meetings:   Marland Kitchen Marital Status:      Review of Systems: A 12 point ROS discussed and pertinent positives are indicated in the HPI above.  All other systems are negative.  Review of Systems  Constitutional: Positive for activity change. Negative for fever.  Respiratory: Positive for shortness of breath.   Cardiovascular: Positive for leg swelling.  Gastrointestinal: Negative for abdominal pain.  Neurological: Negative for weakness.  Psychiatric/Behavioral: Positive for behavioral problems. Negative for confusion.    Vital Signs: BP 129/60 (BP Location: Left Wrist)   Pulse 64   Temp 98.3 F (36.8 C) (Oral)   Resp 20   Ht 6' (1.829 m)   Wt (!) 309 lb 8.4 oz (140.4 kg)   SpO2 98%   BMI 41.98 kg/m   Physical Exam Cardiovascular:     Rate and Rhythm: Normal rate and regular rhythm.  Pulmonary:     Effort: Pulmonary effort is normal.  Musculoskeletal:     Right lower leg: Tenderness present. Edema present.  Skin:    General: Skin is warm and dry.  Neurological:     Mental Status: He is alert and oriented to person, place, and time.  Psychiatric:        Behavior: Behavior normal.     Imaging: DG Tibia/Fibula Right  Result Date: 12/28/2019 CLINICAL DATA:  Right lower leg redness and swelling. EXAM: RIGHT TIBIA AND FIBULA - 2 VIEW COMPARISON:  None. FINDINGS: No fracture or bone lesion. No bone resorption to suggest osteomyelitis. Degenerative changes involve both the knee and ankle joints, with joint space narrowing  most evident medial compartment of the knee. There is diffuse soft tissue swelling. Surgical vascular clips are noted along the medial leg consistent with previous vein harvesting procedure. No soft tissue air. IMPRESSION: 1. No fracture, bone lesion or evidence of osteomyelitis. 2. Osteoarthritic changes of the knee and ankle joints. 3.  Nonspecific soft tissue swelling.  No soft tissue air. Electronically Signed   By: Lajean Manes M.D.   On: 12/28/2019 14:53   MR TIBIA FIBULA RIGHT WO CONTRAST  Result Date: 12/28/2019 CLINICAL DATA:  Right lower leg redness and swelling. Evaluate for abscess. EXAM: MRI OF LOWER RIGHT EXTREMITY WITHOUT CONTRAST TECHNIQUE: Multiplanar, multisequence MR imaging of the right lower leg was performed. No intravenous contrast was administered. COMPARISON:  Right tibia fibula x-rays from same day. FINDINGS: Examination is limited to coronal stir and coronal and axial T1 sequences as the patient terminated the exam early due to pain. Bones/Joint/Cartilage No suspicious marrow signal abnormality. No acute fracture or dislocation. Old trauma at the tip of the right lateral malleolus. Advanced osteoarthritis of the right knee medial compartment and right tibiotalar joint. Prior left knee arthroplasty. No joint effusion. Ligaments Collateral ligaments are intact. Muscles and Tendons Flexor, peroneal, extensor, and Achilles tendons are intact. Relatively symmetric mild diffuse edema and fatty infiltration of the bilateral lower leg muscles, nonspecific, but potentially related to denervation. Soft tissue Asymmetric right greater than left lower leg circumferential subcutaneous edema. 2.1 x 1.2 x 3.7 cm fluid collection in the posteromedial proximal lower leg (series 13, image 31; series 9, image 19). No soft tissue mass. IMPRESSION: 1. Limited study. The patient terminated the exam early due to pain. 2. Right greater than left lower leg subcutaneous edema, concerning for right lower leg  cellulitis superimposed on bilateral venous insufficiency versus third spacing. 2.1 x 1.2 x 3.7 cm fluid collection in the right posteromedial proximal lower leg may represent a small abscess. 3. No acute osseous abnormality. Electronically Signed   By: Titus Dubin M.D.   On: 12/28/2019 19:35   DG Chest Portable 1 View  Result Date: 12/28/2019 CLINICAL DATA:  Pt arrived from home via [facility] c/o of chest pressure starting at approx 3am when he got up from bed to use the bathroom. Bilateral lower extremity swelling and redness EXAM: PORTABLE CHEST 1 VIEW COMPARISON:  05/22/2019 FINDINGS: Stable changes from prior CABG surgery. Cardiac silhouette is mildly enlarged. No mediastinal or masses. There are prominent bronchovascular markings. Lungs otherwise clear. No pleural effusion or pneumothorax. Skeletal structures are grossly intact IMPRESSION: No active disease. Electronically Signed   By: Lajean Manes M.D.   On: 12/28/2019 08:09   DG HIP UNILAT W OR W/O PELVIS 2-3 VIEWS RIGHT  Result Date: 12/18/2019 CLINICAL DATA:  Fall, hip pain EXAM: DG HIP (WITH OR WITHOUT PELVIS) 2-3V RIGHT COMPARISON:  None. FINDINGS: Alignment is anatomic. There is no acute fracture. Joint space is preserved. IMPRESSION: No acute fracture. Electronically Signed   By: Macy Mis M.D.   On: 12/18/2019 15:47   VAS [place] LOWER EXTREMITY VENOUS (DVT) ([facility] [place] 7a-7p)  Result Date: 12/28/2019  Lower Venous DVTStudy Indications: Edema, Erythema, and currently on doxycycline.  Limitations: Body habitus and poor ultrasound/tissue interface. Comparison Study: No prior study Performing Technologist: [name] MHA, RDMS, RVT, RDCS  Examination Guidelines: A complete evaluation includes B-mode imaging, spectral Doppler, color Doppler, and power Doppler as needed of all accessible portions of each vessel. Bilateral testing is considered an integral part of a complete examination. Limited examinations for reoccurring  indications may be performed as noted. The reflux portion of the exam is performed with the patient in reverse Trendelenburg.  +---------+---------------+---------+-----------+----------+--------------+ RIGHT    CompressibilityPhasicitySpontaneityPropertiesThrombus Aging +---------+---------------+---------+-----------+----------+--------------+ CFV      Full           No  Yes                                 +---------+---------------+---------+-----------+----------+--------------+ SFJ      Full                                                        +---------+---------------+---------+-----------+----------+--------------+ FV Prox  Full                                                        +---------+---------------+---------+-----------+----------+--------------+ FV Mid   Full                                                        +---------+---------------+---------+-----------+----------+--------------+ FV DistalFull                                                        +---------+---------------+---------+-----------+----------+--------------+ PFV      Full                                                        +---------+---------------+---------+-----------+----------+--------------+ POP      Full           No       Yes                                 +---------+---------------+---------+-----------+----------+--------------+ PTV      Full                                                        +---------+---------------+---------+-----------+----------+--------------+   Right Technical Findings: Not visualized segments include Peroneal veins.  +----+---------------+---------+-----------+----------+--------------+ LEFTCompressibilityPhasicitySpontaneityPropertiesThrombus Aging +----+---------------+---------+-----------+----------+--------------+ CFV Full           No       Yes                                  +----+---------------+---------+-----------+----------+--------------+     Summary: RIGHT: - There is no evidence of deep vein thrombosis in the lower extremity. However, portions of this examination were limited- see technologist comments above.  - No cystic structure found in the popliteal fossa.  LEFT: - No evidence of common femoral vein obstruction.  Lower extremity venous flow is pulsatile, suggestive of possibly elevated right heart pressure.  *See table(s) above for measurements  and observations. Electronically signed by Curt Jews MD on 12/28/2019 at 7:04:54 PM.    Final     Labs:  CBC: Recent Labs    06/14/19 0409 06/25/19 8185 09/29/19 0916 10/08/19 1022 12/28/19 0824 12/29/19 0726  WBC 10.2  --  6.3  --  8.9 7.3  HGB 7.7*   < > 8.3* 9.2* 11.0* 10.3*  HCT 26.0*   < > 28.0* 27.0* 36.0* 34.0*  PLT 260  --  165  --  206 155   < > = values in this interval not displayed.    COAGS: Recent Labs    04/16/19 0745 04/16/19 0745 04/16/19 1744 04/17/19 1149 04/24/19 0423 04/25/19 0417 04/26/19 0429 04/27/19 0314  INR 1.2  --  1.5*  --   --   --   --   --   APTT 28   < > 37*   < > 38* 43* 44* 42*   < > = values in this interval not displayed.    BMP: Recent Labs    06/13/19 1222 06/25/19 0811 09/29/19 0916 10/08/19 1022 12/28/19 0824 12/29/19 0726  NA 141   < > 140 136 138 139  K 3.6   < > 5.0 4.3 5.8* 4.5  CL 97*   < > 97* 95* 101 98  CO2 31  --  30  --  19* 27  GLUCOSE 120*   < > 92 79 77 77  BUN 28*   < > 35* 29* 77* 37*  CALCIUM 10.2  --  9.1  --  8.9 8.6*  CREATININE 4.87*   < > 7.95* 7.00* 13.80* 8.52*  GFRNONAA 11*  --  6*  --  3* 6*  GFRAA 13*  --  7*  --  4* 7*   < > = values in this interval not displayed.    LIVER FUNCTION TESTS: Recent Labs    06/11/19 1157 06/11/19 1157 06/13/19 1222 09/29/19 0916 12/28/19 0824 12/29/19 0726  BILITOT 0.8  --  0.3 0.8 1.1  --   AST 18  --  15 13* 24  --   ALT 17  --  15 13 32  --   ALKPHOS 89  --  87 79  116  --   PROT 5.3*  --  5.3* 5.8* 6.2*  --   ALBUMIN 2.6*   < > 2.6* 3.0* 2.7* 2.7*   < > = values in this interval not displayed.    TUMOR MARKERS: No results for input(s): AFPTM, CEA, CA199, CHROMGRNA in the last 8760 hours.  Assessment and Plan:  Right low leg (calf) abscess For aspiration only of this collection Risks and benefits of right calf abscess aspiration was discussed with the patient and/or patient's family including, but not limited to bleeding, infection, damage to adjacent structures or low yield requiring additional tests.  All of the questions were answered and there is agreement to proceed. Consent signed and in chart.   Thank you for this interesting consult.  I greatly enjoyed meeting Alex Williams and look forward to participating in their care.  A copy of this report was sent to the requesting provider on this date.  Electronically Signed: Lavonia Drafts, PA-C 12/29/2019, 11:12 AM   I spent a total of 20 Minutes    in face to face in clinical consultation, greater than 50% of which was counseling/coordinating care for right calf abscess aspiration

## 2019-12-29 NOTE — Progress Notes (Signed)
Pt transported back to room from HD.

## 2019-12-29 NOTE — Consult Note (Signed)
ORTHOPAEDIC CONSULTATION  REQUESTING PHYSICIAN: Aldine Contes, MD  Chief Complaint: Right calf abscess  HPI: Alex Williams is a 69 y.o. male who presents with right calf abscess and cellulitis.  On HD for ESRD.  Ortho asked to evaluate.  Past Medical History:  Diagnosis Date  . Cervical disc disease   . Chronic kidney disease   . COLONIC POLYPS, HX OF 06/27/2007  . EXOGENOUS OBESITY 01/30/2010  . GERD (gastroesophageal reflux disease)    PMH  . GOUT 01/30/2010  . HYPERCHOLESTEROLEMIA 06/30/2007  . HYPERTENSION 06/27/2007  . LOW BACK PAIN 06/27/2007  . NEPHROLITHIASIS, HX OF 06/27/2007  . OSTEOARTHRITIS 06/27/2007  . SLEEP APNEA, OBSTRUCTIVE, MODERATE 01/27/2009  . Wears dentures    upper   Past Surgical History:  Procedure Laterality Date  . AV FISTULA PLACEMENT Right 01/19/2019   Procedure: RIGHT BRACHIOCEPHALIC ARTERIOVENOUS (AV) FISTULA CREATION;  Surgeon: Angelia Mould, MD;  Location: [facility];  Service: Vascular;  Laterality: Right;  . CARDIAC CATHETERIZATION    . CARPAL TUNNEL RELEASE     left hand  . COLONOSCOPY     with polypectomy  . CORONARY ARTERY BYPASS GRAFT N/A 04/16/2019   Procedure: CORONARY ARTERY BYPASS GRAFTING (CABG)X3  , WITH ENDOSCOPIC HARVESTING OF RIGHT GREATER SAPHENOUS VEIN;  Surgeon: Lajuana Matte, MD;  Location: [facility];  Service: Open Heart Surgery;  Laterality: N/A;  . CORONARY/GRAFT ACUTE MI REVASCULARIZATION N/A 04/16/2019   Procedure: CORONARY/GRAFT ACUTE MI REVASCULARIZATION;  Surgeon: [place] Blanks, MD;  Location: [facility];  Service: Cardiovascular;  Laterality: N/A;  . INSERTION OF DIALYSIS CATHETER N/A 04/29/2019   Procedure: INSERTION OF TUNNELED DIALYSIS CATHETER, right internal jugular;  Surgeon: Rosetta Posner, MD;  Location: [facility];  Service: Vascular;  Laterality: N/A;  . JOINT REPLACEMENT Left 2005   knee  . LUMBAR LAMINECTOMY    . MULTIPLE TOOTH EXTRACTIONS    . PERIPHERAL VASCULAR BALLOON ANGIOPLASTY  Right 06/25/2019   Procedure: PERIPHERAL VASCULAR BALLOON ANGIOPLASTY;  Surgeon: Marty Heck, MD;  Location: [facility];  Service: Cardiovascular;  Laterality: Right;  . RADIOFREQUENCY ABLATION KIDNEY    . RIGHT/LEFT HEART CATH AND CORONARY ANGIOGRAPHY N/A 04/16/2019   Procedure: RIGHT/LEFT HEART CATH AND CORONARY ANGIOGRAPHY;  Surgeon: [place] Blanks, MD;  Location: [facility];  Service: Cardiovascular;  Laterality: N/A;  . TEE WITHOUT CARDIOVERSION N/A 04/16/2019   Procedure: TRANSESOPHAGEAL ECHOCARDIOGRAM (TEE);  Surgeon: Lajuana Matte, MD;  Location: [facility];  Service: Open Heart Surgery;  Laterality: N/A;  . TEE WITHOUT CARDIOVERSION N/A 10/08/2019   Procedure: TRANSESOPHAGEAL ECHOCARDIOGRAM (TEE);  Surgeon: Larey Dresser, MD;  Location: [facility];  Service: Cardiovascular;  Laterality: N/A;  . TOTAL KNEE ARTHROPLASTY     left  . VENTRICULAR ASSIST DEVICE INSERTION N/A 04/16/2019   Procedure: VENTRICULAR ASSIST DEVICE INSERTION;  Surgeon: [place] Blanks, MD;  Location: [place];  Service: Cardiovascular;  Laterality: N/A;   Social History   Socioeconomic History  . Marital status: Legally Separated    Spouse name: Not on file  . Number of children: 2  . Years of education: Not on file  . Highest education level: Not on file  Occupational History  . Not on file  Tobacco Use  . Smoking status: Former Smoker    Quit date: 07/23/1980    Years since quitting: 39.4  . Smokeless tobacco: Never Used  Substance and Sexual Activity  . Alcohol use: Yes  Alcohol/week: 1.0 standard drinks    Types: 1 Cans of beer per week    Comment: rare  . Drug use: No  . Sexual activity: Not Currently  Other Topics Concern  . Not on file  Social History Narrative   Pt lives alone. He has one son who lives within 3 miles who he usually sees daily. He has two sisters living in the next town over who he also sees on a weekly basis. He is a  religious man who attends church at least 3 Sundays each month.   He operates [employer] full time.    Social Determinants of Health   Financial Resource Strain:   . Difficulty of Paying Living Expenses:   Food Insecurity:   . Worried About Charity fundraiser in the Last Year:   . Arboriculturist in the Last Year:   Transportation Needs:   . Film/video editor (Medical):   Marland Kitchen Lack of Transportation (Non-Medical):   Physical Activity:   . Days of Exercise per Week:   . Minutes of Exercise per Session:   Stress:   . Feeling of Stress :   Social Connections:   . Frequency of Communication with Friends and Family:   . Frequency of Social Gatherings with Friends and Family:   . Attends Religious Services:   . Active Member of Clubs or Organizations:   . Attends Archivist Meetings:   Marland Kitchen Marital Status:    Family History  Problem Relation Age of Onset  . Hypertension Other   . Diabetes Sister   . Hyperlipidemia Sister   . Sleep apnea Sister    - negative except otherwise stated in the family history section Allergies  Allergen Reactions  . Codeine Phosphate Other (See Comments)    Hyperactive    Prior to Admission medications   Medication Sig Start Date End Date Taking? Authorizing Provider  allopurinol (ZYLOPRIM) 100 MG tablet Take 1 tablet (100 mg total) by mouth daily. 05/08/19  Yes Angiulli, Lavon Paganini, PA-C  amiodarone (PACERONE) 200 MG tablet Take 200 mg by mouth daily. 12/27/19  Yes [provider]  aspirin EC 81 MG EC tablet Take 1 tablet (81 mg total) by mouth daily. 05/01/19  Yes Conte, Tessa N, PA-C  bisoprolol (ZEBETA) 5 MG tablet TAKE 1 TABLET AS DIRECTED ON NON DIALYSIS TUES,THURS,SAT,SUN NEEDS APPT Patient taking differently: Take 5 mg by mouth See admin instructions. Take on non dialysis days (tues,thurs,sat,sun) 11/26/19  Yes Larey Dresser, MD  doxycycline (VIBRAMYCIN) 100 MG capsule Take 100 mg by mouth 2 (two) times daily. 12/23/19  Yes  [provider]  ELIQUIS 5 MG TABS tablet TAKE 1 TABLET BY MOUTH TWICE A DAY Patient taking differently: Take 5 mg by mouth 2 (two) times daily.  10/21/19  Yes Larey Dresser, MD  ferric citrate (AURYXIA) 1 GM 210 MG(Fe) tablet Take 420 mg by mouth 3 (three) times daily with meals.    Yes [provider]  fluticasone (FLONASE) 50 MCG/ACT nasal spray Place 1 spray into both nostrils daily as needed for allergies or rhinitis.   Yes [provider]  gabapentin (NEURONTIN) 100 MG capsule TAKE 1 CAPSULE AT BEDTIME ON HD DAYS (M,W,F) Patient taking differently: Take 100 mg by mouth See admin instructions. TAKE AT BEDTIME ON HD DAYS (M,W,F) 12/17/19  Yes [name], MD  HYDROcodone-acetaminophen (NORCO/VICODIN) 5-325 MG tablet Take 1 tablet by mouth every 6 (six) hours as needed for  moderate pain. 12/18/19  Yes [name], MD  midodrine (PROAMATINE) 10 MG tablet Take 1 tablet (10 mg total) by mouth every Monday, Wednesday, and Friday with hemodialysis. 05/08/19  Yes Angiulli, Lavon Paganini, PA-C  multivitamin (RENA-VIT) TABS tablet Take 1 tablet by mouth at bedtime. 05/07/19  Yes Angiulli, Lavon Paganini, PA-C  ondansetron (ZOFRAN-ODT) 8 MG disintegrating tablet Take 1 tablet (8 mg total) by mouth every 8 (eight) hours as needed for nausea or vomiting. 10/20/19  Yes [name], MD  pantoprazole (PROTONIX) 40 MG tablet Take 40 mg by mouth daily as needed (acid reflux).    Yes [provider]  rosuvastatin (CRESTOR) 10 MG tablet Take 1 tablet (10 mg total) by mouth daily. 09/29/19 12/28/19 Yes Larey Dresser, MD  cinacalcet (SENSIPAR) 30 MG tablet Take 1 tablet (30 mg total) by mouth daily with supper. Patient not taking: Reported on 12/28/2019 05/22/19   Lajuana Matte, MD   DG Tibia/Fibula Right  Result Date: 12/28/2019 CLINICAL DATA:  Right lower leg redness and swelling. EXAM: RIGHT TIBIA AND FIBULA - 2 VIEW COMPARISON:  None. FINDINGS: No fracture or bone  lesion. No bone resorption to suggest osteomyelitis. Degenerative changes involve both the knee and ankle joints, with joint space narrowing most evident medial compartment of the knee. There is diffuse soft tissue swelling. Surgical vascular clips are noted along the medial leg consistent with previous vein harvesting procedure. No soft tissue air. IMPRESSION: 1. No fracture, bone lesion or evidence of osteomyelitis. 2. Osteoarthritic changes of the knee and ankle joints. 3. Nonspecific soft tissue swelling.  No soft tissue air. Electronically Signed   By: Lajean Manes M.D.   On: 12/28/2019 14:53   MR TIBIA FIBULA RIGHT WO CONTRAST  Result Date: 12/28/2019 CLINICAL DATA:  Right lower leg redness and swelling. Evaluate for abscess. EXAM: MRI OF LOWER RIGHT EXTREMITY WITHOUT CONTRAST TECHNIQUE: Multiplanar, multisequence MR imaging of the right lower leg was performed. No intravenous contrast was administered. COMPARISON:  Right tibia fibula x-rays from same day. FINDINGS: Examination is limited to coronal stir and coronal and axial T1 sequences as the patient terminated the exam early due to pain. Bones/Joint/Cartilage No suspicious marrow signal abnormality. No acute fracture or dislocation. Old trauma at the tip of the right lateral malleolus. Advanced osteoarthritis of the right knee medial compartment and right tibiotalar joint. Prior left knee arthroplasty. No joint effusion. Ligaments Collateral ligaments are intact. Muscles and Tendons Flexor, peroneal, extensor, and Achilles tendons are intact. Relatively symmetric mild diffuse edema and fatty infiltration of the bilateral lower leg muscles, nonspecific, but potentially related to denervation. Soft tissue Asymmetric right greater than left lower leg circumferential subcutaneous edema. 2.1 x 1.2 x 3.7 cm fluid collection in the posteromedial proximal lower leg (series 13, image 31; series 9, image 19). No soft tissue mass. IMPRESSION: 1. Limited study.  The patient terminated the exam early due to pain. 2. Right greater than left lower leg subcutaneous edema, concerning for right lower leg cellulitis superimposed on bilateral venous insufficiency versus third spacing. 2.1 x 1.2 x 3.7 cm fluid collection in the right posteromedial proximal lower leg may represent a small abscess. 3. No acute osseous abnormality. Electronically Signed   By: Titus Dubin M.D.   On: 12/28/2019 19:35   DG Chest Portable 1 View  Result Date: 12/28/2019 CLINICAL DATA:  Pt arrived from home via [place] c/o of chest pressure starting at approx 3am when he got up from bed to use  the bathroom. Bilateral lower extremity swelling and redness EXAM: PORTABLE CHEST 1 VIEW COMPARISON:  05/22/2019 FINDINGS: Stable changes from prior CABG surgery. Cardiac silhouette is mildly enlarged. No mediastinal or masses. There are prominent bronchovascular markings. Lungs otherwise clear. No pleural effusion or pneumothorax. Skeletal structures are grossly intact IMPRESSION: No active disease. Electronically Signed   By: Lajean Manes M.D.   On: 12/28/2019 08:09   VAS [place] LOWER EXTREMITY VENOUS (DVT) ([facility] [place] 7a-7p)  Result Date: 12/28/2019  Lower Venous DVTStudy Indications: Edema, Erythema, and currently on doxycycline.  Limitations: Body habitus and poor ultrasound/tissue interface. Comparison Study: No prior study Performing Technologist: [name] MHA, RDMS, RVT, RDCS  Examination Guidelines: A complete evaluation includes B-mode imaging, spectral Doppler, color Doppler, and power Doppler as needed of all accessible portions of each vessel. Bilateral testing is considered an integral part of a complete examination. Limited examinations for reoccurring indications may be performed as noted. The reflux portion of the exam is performed with the patient in reverse Trendelenburg.  +---------+---------------+---------+-----------+----------+--------------+ RIGHT     CompressibilityPhasicitySpontaneityPropertiesThrombus Aging +---------+---------------+---------+-----------+----------+--------------+ CFV      Full           No       Yes                                 +---------+---------------+---------+-----------+----------+--------------+ SFJ      Full                                                        +---------+---------------+---------+-----------+----------+--------------+ FV Prox  Full                                                        +---------+---------------+---------+-----------+----------+--------------+ FV Mid   Full                                                        +---------+---------------+---------+-----------+----------+--------------+ FV DistalFull                                                        +---------+---------------+---------+-----------+----------+--------------+ PFV      Full                                                        +---------+---------------+---------+-----------+----------+--------------+ POP      Full           No       Yes                                 +---------+---------------+---------+-----------+----------+--------------+  PTV      Full                                                        +---------+---------------+---------+-----------+----------+--------------+   Right Technical Findings: Not visualized segments include Peroneal veins.  +----+---------------+---------+-----------+----------+--------------+ LEFTCompressibilityPhasicitySpontaneityPropertiesThrombus Aging +----+---------------+---------+-----------+----------+--------------+ CFV Full           No       Yes                                 +----+---------------+---------+-----------+----------+--------------+     Summary: RIGHT: - There is no evidence of deep vein thrombosis in the lower extremity. However, portions of this examination were limited- see technologist  comments above.  - No cystic structure found in the popliteal fossa.  LEFT: - No evidence of common femoral vein obstruction.  Lower extremity venous flow is pulsatile, suggestive of possibly elevated right heart pressure.  *See table(s) above for measurements and observations. Electronically signed by Curt Jews MD on 12/28/2019 at 7:04:54 PM.    Final    - pertinent xrays, CT, MRI studies were reviewed and independently interpreted  Positive ROS: All other systems have been reviewed and were otherwise negative with the exception of those mentioned in the HPI and as above.  Physical Exam: General: No acute distress Cardiovascular: No pedal edema Respiratory: No cyanosis, no use of accessory musculature GI: No organomegaly, abdomen is soft and non-tender Skin: No lesions in the area of chief complaint Neurologic: Sensation intact distally Psychiatric: Patient is at baseline mood and affect Lymphatic: No axillary or cervical lymphadenopathy  MUSCULOSKELETAL:  - cellulitis of the RLE - there is a palpable, tender fluctuant mass just underneath the skin - NVI distally  Assessment: Right calf subcutaneous abscess  Plan: - I have reviewed MRI findings and given the size and location, I would recommend IR drainage and IV abx in order to save the patient from having to go to the OR and undergo anesthesia  Thank you for the consult and the opportunity to see [name]. Eduard Roux, MD [facility] 7:43 AM

## 2019-12-29 NOTE — Progress Notes (Signed)
Date: 12/29/2019  Patient name: Alex Williams  Medical record number: [id number]  Date of birth: October 15, 1950   I have seen and evaluated Alex Williams and discussed their care with the Residency Team.  In brief, patient is a 69 year old male with a past medical history of hypertension, ESRD on hemodialysis, CAD status post recent CABG 3 weeks ago, GERD and OSA who presented to the ED with worsening pain and swelling of his right lower extremity as well as shortness of breath over the last couple of days.  Patient states that on Thursday he noted redness over his right calf and was given doxycycline by his doctor the next day.  Patient states that he missed hemodialysis on Friday as he was going to his doctors appointment.  Over the weekend patient noted progressive swelling, erythema and pain in his right lower extremity as well as worsening shortness of breath and came to the ED for further evaluation.  Patient had shortness of breath with exertion as well as on laying flat.  No PND, no palpitations, no lightheadedness, no syncope, no focal weakness, no fevers or chills, no nausea or vomiting, no abdominal pain, no diarrhea.  Patient states that his shortness of breath is improved basilar some redness and pain in his right lower extremity.  PMHx, Fam Hx, and/or Soc Hx : As per resident admit note  Vitals:   12/29/19 0815 12/29/19 1300  BP: 129/60 110/66  Pulse: 64 62  Resp:  20  Temp:  (!) 97.5 F (36.4 C)  SpO2:  96%   General: Awake, alert, oriented x3, NAD CVs: Regular rate and rhythm, normal heart sounds Lungs: CTA bilaterally Abdomen: Soft, nontender, nondistended, normoactive bowel sounds Extremities: 2+ bilateral lower extremity pitting edema (right greater than left), patient noted to have erythema and increased local warmth over right calf as well as palpable, tender fluctuant mass underneath the skin HEENT: Normocephalic, atraumatic Neuro: Oriented x3, no focal  deficits    Skin: Erythema noted over right calf  Assessment and Plan: I have seen and evaluated the patient as outlined above. I agree with the formulated Assessment and Plan as detailed in the residents' note, with the following changes:   1.  Right lower extremity abscess with surrounding cellulitis: -Patient presented to the ED with worsening pain and swelling of his right upper extremity over the last couple of days and is noted to have a tender fluctuant mass over his right calf consistent with a small subcutaneous abscess as well as erythema and increased local warmth over his right calf consistent with cellulitis.  Patient was started on doxycycline as an outpatient but did not respond to this. -Ortho follow-up and recommendations appreciated.  Patient had an MRI of his right lower extremity which showed no evidence of osteomyelitis but did show a small subtenons abscess -Patient scheduled to go to IR for drainage of his abscess today -We will continue with IV vancomycin for now and transition to oral doxycycline on discharge for his cellulitis -No further work-up at this time -Patient should be stable for DC home after his procedure  2.  Worsening shortness of breath in the setting of missed hemodialysis session: -Patient noted to have worsening shortness of breath and increased bilateral lower extremity swelling on admission in setting of missed hemodialysis session and a mildly elevated potassium. -Patient stated shortness of breath is resolved after hemodialysis yesterday and electron abnormalities have resolved as well -Continue with hemodialysis per nephrology -No further work-up at this  time  Aldine Contes, MD 6/8/20211:47 PM

## 2019-12-29 NOTE — Discharge Summary (Addendum)
Name: Alex Williams MRN: [id number] DOB: 1951/02/15 69 y.o. PCP: Alex Guys, MD  Date of Admission: 12/28/2019  7:08 AM Date of Discharge: 12/29/2019 Attending Physician: Dr. Aldine Contes  Discharge Diagnosis: 1. Alex Williams 2. Volume Overload 2/2 Missed HD  Discharge Medications: Allergies as of 12/29/2019      Reactions   Codeine Phosphate Other (See Comments)   Hyperactive       Medication List    TAKE these medications   allopurinol 100 MG tablet Commonly known as: ZYLOPRIM Take 1 tablet (100 mg total) by mouth daily.   amiodarone 200 MG tablet Commonly known as: PACERONE Take 200 mg by mouth daily.   aspirin 81 MG EC tablet Take 1 tablet (81 mg total) by mouth daily.   bisoprolol 5 MG tablet Commonly known as: ZEBETA TAKE 1 TABLET AS DIRECTED ON NON DIALYSIS TUES,THURS,SAT,SUN NEEDS APPT What changed: See the new instructions.   calcitRIOL 0.25 MCG capsule Commonly known as: ROCALTROL Take 1 capsule (0.25 mcg total) by mouth every Monday, Wednesday, and Friday with hemodialysis. Start taking on: December 30, 2019   cinacalcet 30 MG tablet Commonly known as: SENSIPAR Take 1 tablet (30 mg total) by mouth daily with supper.   doxycycline 100 MG capsule Commonly known as: VIBRAMYCIN Take 100 mg by mouth 2 (two) times daily. What changed: Another medication with the same name was added. Make sure you understand how and when to take each.   doxycycline 100 MG tablet Commonly known as: VIBRA-TABS Take 1 tablet (100 mg total) by mouth 2 (two) times daily. What changed: You were already taking a medication with the same name, and this prescription was added. Make sure you understand how and when to take each.   Eliquis 5 MG Tabs tablet Generic drug: apixaban TAKE 1 TABLET BY MOUTH TWICE A DAY What changed: how much to take   ferric citrate 1 GM 210 MG(Fe) tablet Commonly known as: AURYXIA Take 420 mg by mouth 3 (three) times daily with meals.     fluticasone 50 MCG/ACT nasal spray Commonly known as: FLONASE Place 1 spray into both nostrils daily as needed for allergies or rhinitis.   gabapentin 100 MG capsule Commonly known as: NEURONTIN TAKE 1 CAPSULE AT BEDTIME ON HD DAYS (M,W,F) What changed:   how much to take  how to take this  when to take this  additional instructions   HYDROcodone-acetaminophen 5-325 MG tablet Commonly known as: NORCO/VICODIN Take 1 tablet by mouth every 6 (six) hours as needed for moderate pain.   midodrine 10 MG tablet Commonly known as: PROAMATINE Take 1 tablet (10 mg total) by mouth every Monday, Wednesday, and Friday with hemodialysis.   multivitamin Tabs tablet Take 1 tablet by mouth at bedtime.   ondansetron 8 MG disintegrating tablet Commonly known as: ZOFRAN-ODT Take 1 tablet (8 mg total) by mouth every 8 (eight) hours as needed for nausea or vomiting.   pantoprazole 40 MG tablet Commonly known as: PROTONIX Take 40 mg by mouth daily as needed (acid reflux).   rosuvastatin 10 MG tablet Commonly known as: CRESTOR Take 1 tablet (10 mg total) by mouth daily.       Disposition and follow-up:   Alex Williams was discharged from [facility] in Stable condition.  At the hospital follow up visit please address:  1. Alex Williams: Drained by IR. Discharged with doxycycline. Reassess for resolution of infection. Blood cx without growth. Wound culture grew few staph  aureus.  2. Volume Overload 2/2 Missed HD: Admitted with SOB and orthopnea which improved with HD. Discussed importance of not missing HD.   2.  Labs / imaging needed at time of follow-up: CBC  3.  Pending labs/ test needing follow-up: none  Follow-up Appointments:   Hospital Course by problem list: 1. Alex Williams  Alex Williams is a 69yo male with PMH ESRD on HD, OSA, gout, HTN, HLD, MI s/p CABGx3 03/2019, and atrial fibrillation on eliquis who presented with right lower extremity swelling and  shortness of breath. Vitals were all within normal limits. LE doppler US was negative for DVT. A fluctuant Williams was found on bedside US which was 2x1.5 cm. He was started on vancomycin and IR was consulted for I&D as patient was on eliquis. Eliquis was held. He underwent aspiration of Williams, which grew few staph aureus. He was discharged on doxycycline with recommend follow-up with his PCP.   2. Volume Overload 2/2 Missed HD Patient presented with orthopnea. He had missed his Friday and Monday dialysis due to his right lower extremity swelling. He underwent HD with resolution of his shortness of breath. He was discharged with plans to continue his normal dialysis schedule.   Discharge Vitals:   BP 121/72   Pulse 62   Temp 98.6 F (37 C) (Oral)   Resp 20   Ht 6' (1.829 m)   Wt (!) 140.4 kg   SpO2 96%   BMI 41.98 kg/m   Pertinent Labs, Studies, and Procedures:   LE Doppler US RIGHT:  - There is no evidence of deep vein thrombosis in the lower extremity.  However, portions of this examination were limited- see technologist  comments above.  - No cystic structure found in the popliteal fossa.  LEFT:  - No evidence of common femoral vein obstruction.  Lower extremity venous flow is pulsatile, suggestive of possibly elevated  right heart pressure.   Right Lower Extremity Aspiration Ultrasound-guided aspiration right calf subcutaneous fluid collection yielding fluid and debris. Approximately 4-5 mL of fluid and debris was able to be aspirated resulting in decompression of the collection. Electronically Signed   By: Alex Williams M.D.   On: 12/29/2019 16:52  Right Tibia/Fibula xr IMPRESSION: 1. No fracture, bone lesion or evidence of osteomyelitis. 2. Osteoarthritic changes of the knee and ankle joints. 3. Nonspecific soft tissue swelling.  No soft tissue air. Electronically Signed   By: Alex Williams M.D.   On: 12/28/2019 14:53  MRI Rt Tibia/Fibula IMPRESSION: 1.  Limited study. The patient terminated the exam early due to pain. 2. Right greater than left lower leg subcutaneous edema, concerning for right lower leg cellulitis superimposed on bilateral venous insufficiency versus third spacing. 2.1 x 1.2 x 3.7 cm fluid collection in the right posteromedial proximal lower leg may represent a small Williams. 3. No acute osseous abnormality. Electronically Signed   By: Titus Dubin M.D.   On: 12/28/2019 19:35   Williams Culture 12/29/19 Culture FEW STAPHYLOCOCCUS AUREUS  NO ANAEROBES ISOLATED  Performed at [facility],      Discharge Instructions: Discharge Instructions    Diet - low sodium heart healthy   Complete by: As directed    Discharge instructions   Complete by: As directed    You were hospitalized for cellulitis. Thank you for allowing [place] to be part of your care.   Please resume your scheduled dialysis tomorrow.   Please follow-up with your PCP next week.   Please  note these changes made to your medications:   Please take doxycycline 100 mg every twelve hours for the next four days starting this evening.  Take calcitriol 0.25 mcg every Monday, Wednesday and Friday with dialysis.   Resume taking cinacalcet (sensipar) 30 mg tablet by mouth daily with supper.   Increase activity slowly   Complete by: As directed       Signed: Marty Heck, DO 12/29/2019, 7:21 PM   Pager: [phone]

## 2019-12-29 NOTE — Procedures (Signed)
Interventional Radiology Procedure Note  Procedure: US guided aspiration of right calf fluid collection  Complications: None  Estimated Blood Loss: None  Findings: Complex subcu fluid collection of medial right calf measures 3 cm in greatest diameter. 18 G trocar needle advanced under [place] and aspiration yielded about 5 mL of fluid and debris. Area much smaller after aspiration. Sample sent for culture analysis.  Venetia Night. Kathlene Cote, M.D Pager:  [phone]

## 2019-12-29 NOTE — Progress Notes (Signed)
Nsg Discharge Note  Admit Date:  12/28/2019 Discharge date: 12/29/2019   Alex Williams to be D/C'd Home per MD order.  AVS completed.  Copy for chart, and copy for patient signed, and dated. Patient/caregiver able to verbalize understanding.  Discharge Medication: Allergies as of 12/29/2019      Reactions   Codeine Phosphate Other (See Comments)   Hyperactive       Medication List    TAKE these medications   allopurinol 100 MG tablet Commonly known as: ZYLOPRIM Take 1 tablet (100 mg total) by mouth daily.   amiodarone 200 MG tablet Commonly known as: PACERONE Take 200 mg by mouth daily.   aspirin 81 MG EC tablet Take 1 tablet (81 mg total) by mouth daily.   bisoprolol 5 MG tablet Commonly known as: ZEBETA TAKE 1 TABLET AS DIRECTED ON NON DIALYSIS TUES,THURS,SAT,SUN NEEDS APPT What changed: See the new instructions.   calcitRIOL 0.25 MCG capsule Commonly known as: ROCALTROL Take 1 capsule (0.25 mcg total) by mouth every Monday, Wednesday, and Friday with hemodialysis. Start taking on: December 30, 2019   cinacalcet 30 MG tablet Commonly known as: SENSIPAR Take 1 tablet (30 mg total) by mouth daily with supper.   doxycycline 100 MG capsule Commonly known as: VIBRAMYCIN Take 100 mg by mouth 2 (two) times daily. What changed: Another medication with the same name was added. Make sure you understand how and when to take each.   doxycycline 100 MG tablet Commonly known as: VIBRA-TABS Take 1 tablet (100 mg total) by mouth 2 (two) times daily. What changed: You were already taking a medication with the same name, and this prescription was added. Make sure you understand how and when to take each.   Eliquis 5 MG Tabs tablet Generic drug: apixaban TAKE 1 TABLET BY MOUTH TWICE A DAY What changed: how much to take   ferric citrate 1 GM 210 MG(Fe) tablet Commonly known as: AURYXIA Take 420 mg by mouth 3 (three) times daily with meals.   fluticasone 50 MCG/ACT nasal  spray Commonly known as: FLONASE Place 1 spray into both nostrils daily as needed for allergies or rhinitis.   gabapentin 100 MG capsule Commonly known as: NEURONTIN TAKE 1 CAPSULE AT BEDTIME ON HD DAYS (M,W,F) What changed:   how much to take  how to take this  when to take this  additional instructions   HYDROcodone-acetaminophen 5-325 MG tablet Commonly known as: NORCO/VICODIN Take 1 tablet by mouth every 6 (six) hours as needed for moderate pain.   midodrine 10 MG tablet Commonly known as: PROAMATINE Take 1 tablet (10 mg total) by mouth every Monday, Wednesday, and Friday with hemodialysis.   multivitamin Tabs tablet Take 1 tablet by mouth at bedtime.   ondansetron 8 MG disintegrating tablet Commonly known as: ZOFRAN-ODT Take 1 tablet (8 mg total) by mouth every 8 (eight) hours as needed for nausea or vomiting.   pantoprazole 40 MG tablet Commonly known as: PROTONIX Take 40 mg by mouth daily as needed (acid reflux).   rosuvastatin 10 MG tablet Commonly known as: CRESTOR Take 1 tablet (10 mg total) by mouth daily.       Discharge Assessment: Vitals:   12/29/19 1300 12/29/19 1610  BP: 110/66 121/72  Pulse: 62   Resp: 20 20  Temp: (!) 97.5 F (36.4 C) 98.6 F (37 C)  SpO2: 96%    Skin clean, dry and intact without evidence of skin break down, no evidence of skin tears noted.  IV catheter discontinued intact. Site without signs and symptoms of complications - no redness or edema noted at insertion site, patient denies c/o pain - only slight tenderness at site.  Dressing with slight pressure applied.  D/c Instructions-Education: Discharge instructions given to patient/family with verbalized understanding. D/c education completed with patient/family including follow up instructions, medication list, d/c activities limitations if indicated, with other d/c instructions as indicated by MD - patient able to verbalize understanding, all questions fully  answered. Patient instructed to return to ED, call 911, or call MD for any changes in condition.  Patient escorted via [place], and D/C home via private auto.  Erasmo Leventhal, RN 12/29/2019 6:35 PM

## 2019-12-30 DIAGNOSIS — L0291 Cutaneous abscess, unspecified: Secondary | ICD-10-CM | POA: Insufficient documentation

## 2019-12-31 ENCOUNTER — Encounter (HOSPITAL_COMMUNITY): Payer: Self-pay | Admitting: Cardiology

## 2019-12-31 ENCOUNTER — Ambulatory Visit (HOSPITAL_COMMUNITY)
Admission: RE | Admit: 2019-12-31 | Discharge: 2019-12-31 | Disposition: A | Discharge status: Home / Self Care | Payer: Medicare Other | Source: Ambulatory Visit | Attending: Cardiology | Admitting: Cardiology

## 2019-12-31 ENCOUNTER — Other Ambulatory Visit: Payer: Self-pay

## 2019-12-31 VITALS — BP 120/68 | HR 61 | Ht 72.0 in | Wt 305.8 lb

## 2019-12-31 DIAGNOSIS — I132 Hypertensive heart and chronic kidney disease with heart failure and with stage 5 chronic kidney disease, or end stage renal disease: Secondary | ICD-10-CM | POA: Insufficient documentation

## 2019-12-31 DIAGNOSIS — Z885 Allergy status to narcotic agent status: Secondary | ICD-10-CM | POA: Insufficient documentation

## 2019-12-31 DIAGNOSIS — Z8249 Family history of ischemic heart disease and other diseases of the circulatory system: Secondary | ICD-10-CM | POA: Insufficient documentation

## 2019-12-31 DIAGNOSIS — Z951 Presence of aortocoronary bypass graft: Secondary | ICD-10-CM | POA: Diagnosis not present

## 2019-12-31 DIAGNOSIS — Z833 Family history of diabetes mellitus: Secondary | ICD-10-CM | POA: Insufficient documentation

## 2019-12-31 DIAGNOSIS — I255 Ischemic cardiomyopathy: Secondary | ICD-10-CM | POA: Insufficient documentation

## 2019-12-31 DIAGNOSIS — I080 Rheumatic disorders of both mitral and aortic valves: Secondary | ICD-10-CM | POA: Insufficient documentation

## 2019-12-31 DIAGNOSIS — R609 Edema, unspecified: Secondary | ICD-10-CM

## 2019-12-31 DIAGNOSIS — Z7901 Long term (current) use of anticoagulants: Secondary | ICD-10-CM | POA: Diagnosis not present

## 2019-12-31 DIAGNOSIS — I2510 Atherosclerotic heart disease of native coronary artery without angina pectoris: Secondary | ICD-10-CM | POA: Insufficient documentation

## 2019-12-31 DIAGNOSIS — Z8349 Family history of other endocrine, nutritional and metabolic diseases: Secondary | ICD-10-CM | POA: Insufficient documentation

## 2019-12-31 DIAGNOSIS — I480 Paroxysmal atrial fibrillation: Secondary | ICD-10-CM | POA: Insufficient documentation

## 2019-12-31 DIAGNOSIS — Z7982 Long term (current) use of aspirin: Secondary | ICD-10-CM | POA: Insufficient documentation

## 2019-12-31 DIAGNOSIS — E785 Hyperlipidemia, unspecified: Secondary | ICD-10-CM | POA: Diagnosis not present

## 2019-12-31 DIAGNOSIS — R1031 Right lower quadrant pain: Secondary | ICD-10-CM | POA: Diagnosis not present

## 2019-12-31 DIAGNOSIS — N186 End stage renal disease: Secondary | ICD-10-CM | POA: Diagnosis not present

## 2019-12-31 DIAGNOSIS — I5022 Chronic systolic (congestive) heart failure: Secondary | ICD-10-CM | POA: Diagnosis not present

## 2019-12-31 DIAGNOSIS — M109 Gout, unspecified: Secondary | ICD-10-CM | POA: Insufficient documentation

## 2019-12-31 DIAGNOSIS — I252 Old myocardial infarction: Secondary | ICD-10-CM | POA: Diagnosis not present

## 2019-12-31 DIAGNOSIS — Z87891 Personal history of nicotine dependence: Secondary | ICD-10-CM | POA: Insufficient documentation

## 2019-12-31 DIAGNOSIS — I10 Essential (primary) hypertension: Secondary | ICD-10-CM | POA: Diagnosis present

## 2019-12-31 DIAGNOSIS — Z79899 Other long term (current) drug therapy: Secondary | ICD-10-CM | POA: Insufficient documentation

## 2019-12-31 DIAGNOSIS — Z992 Dependence on renal dialysis: Secondary | ICD-10-CM | POA: Diagnosis not present

## 2019-12-31 NOTE — Progress Notes (Signed)
Advanced Heart Failure Clinic Note   Referring Physician: PCP: [name], MD HF Cardiology: Dr. Aundra Dubin  HPI:  69 y.o. with history of ESRD (started on HD in 2020), gout, HTN, hyperlipidemia was admitted to [facility] 04/16/19 for chest pain and palpitations.  On arrival to the ED, he was noted to be in atypical atrial flutter in the 130s-140s with acute anterior infarction. CODE STEMI activated.   He was brought to the cath lab and noted to be hypotensive with SBP in 80s. Norepinephrine was started and Impella was placed.  LHC and RHC were done showing mean RA 15, PA 68/32 mean 45, CI 3.3.  There was 90% distal left main stenosis into the LCx, totally occluded mid LAD after diagonal, 90% mild LCx, 50% ostial RCA, 99% ostial PDA.  POBA to mid LAD but unable to restore flow.   It was suspected that the LAD was a chronic occlusion and the onset of atypical aflutter with RVR drove chest pain and ischemia (via severe distal left main stenosis).  Hs-TnI returned at 1297. Echo showed EF 25% range with apical and peri-apical severe hypokinesis.   Patient was taken to the OR for urgent CABGx 3 by Dr. Kipp Brood. Unable to graft LAD, had LIMA-D2, SVG-OM2, and SVG-PDA. He required dobutamine and NE post op and was eventually weaned off and Impella removed. Afib/flutter treated w/ amiodarone and converted to NSR. Was placed on Eliquis for anticoagulation. Required CVVH for volume removal. Later transitioned to HD. Hypotension limited aggressive HF therapy. Was placed on midodrine to support BP during HD. Once stable, he was transferred to SNF for rehab. He is now back at home.   Echo was done today and reviewed, EF 35-40% with moderate-severe eccentric MR and mild AS.   Admitted on 12/28/19 with RLE cellulitis. Placed on antibiotics. Had lower dopplers negative for DVT. Discharged on doxycycline.   Today he returns for HF follow up.Overall feeling fine. Complaining of r back and groin pain. SOB with  exertion. Denies PND/Orthopnea. Having ongoing lower extremity edema. Appetite ok. No fever or chills. Weight at home 298-304  pounds. Taking all medications. Continues on dialysis 3 days a week.   ECG: NSR, lateral TWIs (personally reviewed)  Labs (11/20): LDL 60, HDL 31  PMH: 1. ESRD: MWF HD. 2. Gout 3. HTN 4. Hyperlipidemia 5. Anemia of renal disease.  6. Atypical atrial flutter: Noted 9/20.  7. Atrial fibrillation: Paroxysmal.  Noted after CABG in 9/20.  8. Chronic systolic CHF: Ischemic cardiomyopathy.   - Echo (9/20): EF 30%, akinesis of the distal 1/2-2/3 of LV.  - Echo (3/21): EF 35-40%, peri-apical akinesis, moderate-severe eccentric MR, mild AS with mean gradient 18 mmHg.  9. CAD: LHC in 9/20 showed 90% dLM, totally occluded mLAD (probably CTO), 90% mid LCx, 99% PDA.  Attempted POBA LAD but unable to restore flow.  - CABG (9/20): SVG-PDA, SVG-OM2, LIMA-D2.  No good LAD target.  10. Aortic stenosis: Mild on 3/21 echo.  11. Mitral regurgitation: Moderate-severe on 3/21 echo.   Review of Systems: All systems reviewed and negative except as per HPI.   Current Outpatient Medications  Medication Sig Dispense Refill  . allopurinol (ZYLOPRIM) 100 MG tablet Take 1 tablet (100 mg total) by mouth daily. 30 tablet 0  . amiodarone (PACERONE) 200 MG tablet Take 200 mg by mouth daily.    Marland Kitchen aspirin EC 81 MG EC tablet Take 1 tablet (81 mg total) by mouth daily.    . bisoprolol (ZEBETA) 5  MG tablet TAKE 1 TABLET AS DIRECTED ON NON DIALYSIS TUES,THURS,SAT,SUN NEEDS APPT 90 tablet 0  . calcitRIOL (ROCALTROL) 0.25 MCG capsule Take 1 capsule (0.25 mcg total) by mouth every Monday, Wednesday, and Friday with hemodialysis. 30 capsule 0  . cinacalcet (SENSIPAR) 30 MG tablet Take 1 tablet (30 mg total) by mouth daily with supper. 60 tablet 0  . diphenhydramine-acetaminophen (TYLENOL PM) 25-500 MG TABS tablet Take 1 tablet by mouth at bedtime as needed.    . doxycycline (VIBRA-TABS) 100 MG tablet  Take 1 tablet (100 mg total) by mouth 2 (two) times daily. 9 tablet 0  . ELIQUIS 5 MG TABS tablet TAKE 1 TABLET BY MOUTH TWICE A DAY 180 tablet 3  . ferric citrate (AURYXIA) 1 GM 210 MG(Fe) tablet Take 420 mg by mouth 3 (three) times daily with meals.     . fluticasone (FLONASE) 50 MCG/ACT nasal spray Place 1 spray into both nostrils daily as needed for allergies or rhinitis.    Marland Kitchen gabapentin (NEURONTIN) 100 MG capsule TAKE 1 CAPSULE AT BEDTIME ON HD DAYS (M,W,F) 30 capsule 2  . HYDROcodone-acetaminophen (NORCO/VICODIN) 5-325 MG tablet Take 1 tablet by mouth every 6 (six) hours as needed for moderate pain. 20 tablet 0  . midodrine (PROAMATINE) 10 MG tablet Take 1 tablet (10 mg total) by mouth every Monday, Wednesday, and Friday with hemodialysis. 30 tablet 0  . multivitamin (RENA-VIT) TABS tablet Take 1 tablet by mouth at bedtime. 30 tablet 0  . ondansetron (ZOFRAN-ODT) 8 MG disintegrating tablet Take 1 tablet (8 mg total) by mouth every 8 (eight) hours as needed for nausea or vomiting. 20 tablet 5  . pantoprazole (PROTONIX) 40 MG tablet Take 40 mg by mouth daily as needed (acid reflux).     . rosuvastatin (CRESTOR) 10 MG tablet Take 1 tablet (10 mg total) by mouth daily. 90 tablet 3   No current facility-administered medications for this encounter.    Allergies  Allergen Reactions  . Codeine Phosphate Other (See Comments)    Hyperactive       Social History   Socioeconomic History  . Marital status: Legally Separated    Spouse name: Not on file  . Number of children: 2  . Years of education: Not on file  . Highest education level: Not on file  Occupational History  . Not on file  Tobacco Use  . Smoking status: Former Smoker    Quit date: 07/23/1980    Years since quitting: 39.4  . Smokeless tobacco: Never Used  Vaping Use  . Vaping Use: Never used  Substance and Sexual Activity  . Alcohol use: Yes    Alcohol/week: 1.0 standard drink    Types: 1 Cans of beer per week     Comment: rare  . Drug use: No  . Sexual activity: Not Currently  Other Topics Concern  . Not on file  Social History Narrative   Pt lives alone. He has one son who lives within 3 miles who he usually sees daily. He has two sisters living in the next town over who he also sees on a weekly basis. He is a religious man who attends church at least 3 Sundays each month.   He operates [employer] full time.    Social Determinants of Health   Financial Resource Strain:   . Difficulty of Paying Living Expenses:   Food Insecurity:   . Worried About Charity fundraiser in the Last Year:   . [employer]  of Food in the Last Year:   Transportation Needs:   . Film/video editor (Medical):   Marland Kitchen Lack of Transportation (Non-Medical):   Physical Activity:   . Days of Exercise per Week:   . Minutes of Exercise per Session:   Stress:   . Feeling of Stress :   Social Connections:   . Frequency of Communication with Friends and Family:   . Frequency of Social Gatherings with Friends and Family:   . Attends Religious Services:   . Active Member of Clubs or Organizations:   . Attends Archivist Meetings:   Marland Kitchen Marital Status:   Intimate Partner Violence:   . Fear of Current or Ex-Partner:   . Emotionally Abused:   Marland Kitchen Physically Abused:   . Sexually Abused:       Family History  Problem Relation Age of Onset  . Hypertension Other   . Diabetes Sister   . Hyperlipidemia Sister   . Sleep apnea Sister     Vitals:   12/31/19 0909  BP: 120/68  Pulse: 61  SpO2: 92%  Weight: (!) 138.7 kg (305 lb 12.8 oz)  Height: 6' (1.829 m)   PHYSICAL EXAM: General:  No resp difficulty HEENT: normal Neck: supple. JPV 9-10  Carotids 2+ bilat; no bruits. No lymphadenopathy or thryomegaly appreciated. Cor: PMI nondisplaced. Regular rate & rhythm. No rubs, gallops. 2/6 HSM Apex.  Lungs: clear Abdomen: soft, nontender, nondistended. No hepatosplenomegaly. No bruits or masses. Good bowel  sounds. Extremities: no cyanosis, clubbing, rash, R and LLE 2+ edema. RLE erhyth Neuro: alert & orientedx3, cranial nerves grossly intact. moves all 4 extremities w/o difficulty. Affect pleasant  ASSESSMENT & PLAN:  1. CAD: Cardiogenic shock in setting of atrial flutter with RVR in 03/2019.  Probable CTO mid LAD with extensive disease in other arteries. Patient taken urgently for CABG supported by Impella. 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 Kipp Brood (unable to graft LAD,  LIMA-D2, SVG-OM2, SVG-PDA).  No chest pain.  - Continue ASA 81.  - Continue  Crestor 10 mg daily 2. Chronic systolic CHF: Echo with EF in 30% range with peri-apical severe akinesis in 9/20. Ischemic cardiomyopathy. Now s/p CABG. Echo today with EF a bit higher at 35-40%.  NYHA class II but not very mobile due to knee pain.  BP low during HD, limits use of cardiac meds.   - With EF up to 35-40%, would hold off on ICD (also given uncertain utility in dialysis patient).  - Volume managed with dialysis.  - Continue bisoprolol 5 mg on non-HD days. - BP too low to add additional cardiomyopathy meds.  - Continue midodrine pre-HD.    3. Atrial fibrillation/flutter:  - Continue amio 200 mg daily for rate control.  - TSH/LFTs recently checked.    - On eliquis 5 mg twice a day.   4. ESRD: HD MWF.  5. Aortic stenosis: Mild on recent  echo.  6. Mitral regurgitation: Moderate to severe eccentric MR.  Not fully visualized.  I am concerned that this could affect the patient's ability to tolerate HD.   - S/P TEE 6/9 that showed moderate MR with restriction posterior leaflet. 7. R groin pain Non erythema. Doppler studies negative for DVT. Asked him to follow up with PCP.   Follow up with Dr Aundra Dubin 3-4 months.     Darrick Grinder, NP 12/31/19

## 2019-12-31 NOTE — Patient Instructions (Signed)
Please follow up with your Primary Care Provider for your groin pain  Please call our office in October to schedule your follow up appointment  If you have any questions or concerns before your next appointment please send [place] a message through [place] or call our office at [phone].    TO LEAVE A MESSAGE FOR THE NURSE SELECT OPTION 2, PLEASE LEAVE A MESSAGE INCLUDING:  YOUR NAME  DATE OF BIRTH  CALL BACK NUMBER  REASON FOR CALL**this is important as we prioritize the call backs  YOU WILL RECEIVE A CALL BACK THE SAME DAY AS LONG AS YOU CALL BEFORE 4:00 PM  At the [facility], you and your health needs are our priority. As part of our continuing mission to provide you with exceptional heart care, we have created designated Provider Care Teams. These Care Teams include your primary Cardiologist (physician) and Advanced Practice Providers (APPs- Physician Assistants and Nurse Practitioners) who all work together to provide you with the care you need, when you need it.   You may see any of the following providers on your designated Care Team at your next follow up:  Dr Glori Bickers  Dr Haynes Kerns, NP  Lyda Jester, [place]  Audry Riles, PharmD   Please be sure to bring in all your medications bottles to every appointment.

## 2020-01-01 ENCOUNTER — Other Ambulatory Visit: Payer: Self-pay

## 2020-01-01 ENCOUNTER — Ambulatory Visit (INDEPENDENT_AMBULATORY_CARE_PROVIDER_SITE_OTHER): Payer: Medicare Other | Admitting: Family Medicine

## 2020-01-01 ENCOUNTER — Encounter: Payer: Self-pay | Admitting: Family Medicine

## 2020-01-01 VITALS — BP 95/62 | HR 66 | Temp 98.1°F | Ht 72.0 in | Wt 299.0 lb

## 2020-01-01 DIAGNOSIS — R110 Nausea: Secondary | ICD-10-CM

## 2020-01-01 DIAGNOSIS — L03115 Cellulitis of right lower limb: Secondary | ICD-10-CM

## 2020-01-01 DIAGNOSIS — I10 Essential (primary) hypertension: Secondary | ICD-10-CM

## 2020-01-01 DIAGNOSIS — H543 Unqualified visual loss, both eyes: Secondary | ICD-10-CM

## 2020-01-01 DIAGNOSIS — R1031 Right lower quadrant pain: Secondary | ICD-10-CM | POA: Diagnosis not present

## 2020-01-01 MED ORDER — HYDROCODONE-ACETAMINOPHEN 5-325 MG PO TABS: 1.0000 | ORAL_TABLET | Freq: Four times a day (QID) | ORAL | 0 refills | Status: DC | PRN

## 2020-01-01 MED ORDER — PROMETHAZINE HCL 12.5 MG PO TABS
12.5000 mg | ORAL_TABLET | Freq: Three times a day (TID) | ORAL | 0 refills | Status: DC | PRN
Start: 2020-01-01 — End: 2020-03-03

## 2020-01-01 MED ORDER — METHOCARBAMOL 500 MG PO TABS: 500.0000 mg | ORAL_TABLET | Freq: Three times a day (TID) | ORAL | 0 refills | Status: DC | PRN

## 2020-01-01 NOTE — Progress Notes (Signed)
6/11/20212:03 PM  Alex Williams [birth year], 69 y.o., male [id number]  Chief Complaint  Patient presents with  . Groin Pain    pain begain on the right side of the hip, pain has moved to the right side of the groin. Taking more pain pills now due to this pain    HPI:   Patient is a 69 y.o. male with past medical history significant for  HTN, HLP, gout, ESRDon HD, MI s/p 3V CABG, knee OA who presents today for right hip/groin pain  Seen 2 weeks ago for right hip pain s/p fall - hip xray - no fx Taking 1/2 vicodin prn Pain now has radiated from hip to groin, does not radiate into his scrotum, no bulge Worse when he tries to get out of bed or stand from sitting  Post dialysis nausea Intermittent, not after each HD session Eats prior Sometimes severe enough that zofran does not help Sometimes with vomiting - food from earlier that moring  Seen in ER - treated for cellulitis of right leg, small abscess drained by IR, neg DVT On doxycycline, he reports that his leg is better  Requesting referral to eye doctor, wears eyeglasses  Depression screen [facility] 2/9 12/18/2019 10/20/2019 09/03/2019  Decreased Interest 0 0 0  Down, Depressed, Hopeless 0 0 0  PHQ - 2 Score 0 0 0  Altered sleeping - - -  Tired, decreased energy - - -  Change in appetite - - -  Feeling bad or failure about yourself  - - -  Trouble concentrating - - -  Moving slowly or fidgety/restless - - -  Suicidal thoughts - - -  PHQ-9 Score - - -  Difficult doing work/chores - - -  Some recent data might be hidden    Fall Risk  01/01/2020 12/18/2019 10/20/2019 09/03/2019 08/24/2019  Falls in the past year? 0 1 0 0 0  Number falls in past yr: 0 1 0 0 -  Comment - - - - -  Injury with Fall? 0 0 0 0 -  Comment - - - - -  Risk for fall due to : - - Impaired mobility - -  Follow up - - - Falls evaluation completed -     Allergies  Allergen Reactions  . Codeine Phosphate Other (See Comments)    Hyperactive     Prior  to Admission medications   Medication Sig Start Date End Date Taking? Authorizing Provider  allopurinol (ZYLOPRIM) 100 MG tablet Take 1 tablet (100 mg total) by mouth daily. 05/08/19  Yes Angiulli, Lavon Paganini, PA-C  amiodarone (PACERONE) 200 MG tablet Take 200 mg by mouth daily. 12/27/19  Yes [provider]  aspirin EC 81 MG EC tablet Take 1 tablet (81 mg total) by mouth daily. 05/01/19  Yes Conte, Tessa N, PA-C  bisoprolol (ZEBETA) 5 MG tablet TAKE 1 TABLET AS DIRECTED ON NON DIALYSIS TUES,THURS,SAT,SUN NEEDS APPT 11/26/19  Yes Larey Dresser, MD  calcitRIOL (ROCALTROL) 0.25 MCG capsule Take 1 capsule (0.25 mcg total) by mouth every Monday, Wednesday, and Friday with hemodialysis. 12/30/19  Yes Seawell, Jaimie A, DO  cinacalcet (SENSIPAR) 30 MG tablet Take 1 tablet (30 mg total) by mouth daily with supper. 05/22/19  Yes Lightfoot, Lucile Crater, MD  diphenhydramine-acetaminophen (TYLENOL PM) 25-500 MG TABS tablet Take 1 tablet by mouth at bedtime as needed.   Yes [provider]  doxycycline (VIBRA-TABS) 100 MG tablet Take 1 tablet (100 mg total) by mouth 2 (  two) times daily. 12/29/19  Yes Seawell, Jaimie A, DO  ELIQUIS 5 MG TABS tablet TAKE 1 TABLET BY MOUTH TWICE A DAY 10/21/19  Yes Larey Dresser, MD  ferric citrate (AURYXIA) 1 GM 210 MG(Fe) tablet Take 420 mg by mouth 3 (three) times daily with meals.    Yes [provider]  fluticasone (FLONASE) 50 MCG/ACT nasal spray Place 1 spray into both nostrils daily as needed for allergies or rhinitis.   Yes [provider]  gabapentin (NEURONTIN) 100 MG capsule TAKE 1 CAPSULE AT BEDTIME ON HD DAYS (M,W,F) 12/17/19  Yes [name], MD  HYDROcodone-acetaminophen (NORCO/VICODIN) 5-325 MG tablet Take 1 tablet by mouth every 6 (six) hours as needed for moderate pain. 12/18/19  Yes [name], MD  midodrine (PROAMATINE) 10 MG tablet Take 1 tablet (10 mg total) by mouth every Monday, Wednesday, and Friday with hemodialysis.  05/08/19  Yes Angiulli, Lavon Paganini, PA-C  multivitamin (RENA-VIT) TABS tablet Take 1 tablet by mouth at bedtime. 05/07/19  Yes Angiulli, Lavon Paganini, PA-C  ondansetron (ZOFRAN-ODT) 8 MG disintegrating tablet Take 1 tablet (8 mg total) by mouth every 8 (eight) hours as needed for nausea or vomiting. 10/20/19  Yes [name], MD  pantoprazole (PROTONIX) 40 MG tablet Take 40 mg by mouth daily as needed (acid reflux).    Yes [provider]  doxycycline (VIBRAMYCIN) 100 MG capsule Take 100 mg by mouth 2 (two) times daily. 12/31/19   [provider]  rosuvastatin (CRESTOR) 10 MG tablet Take 1 tablet (10 mg total) by mouth daily. 09/29/19 12/31/19  Larey Dresser, MD    Past Medical History:  Diagnosis Date  . Cervical disc disease   . Chronic kidney disease   . COLONIC POLYPS, HX OF 06/27/2007  . EXOGENOUS OBESITY 01/30/2010  . GERD (gastroesophageal reflux disease)    PMH  . GOUT 01/30/2010  . HYPERCHOLESTEROLEMIA 06/30/2007  . HYPERTENSION 06/27/2007  . LOW BACK PAIN 06/27/2007  . NEPHROLITHIASIS, HX OF 06/27/2007  . OSTEOARTHRITIS 06/27/2007  . SLEEP APNEA, OBSTRUCTIVE, MODERATE 01/27/2009  . Wears dentures    upper    Past Surgical History:  Procedure Laterality Date  . AV FISTULA PLACEMENT Right 01/19/2019   Procedure: RIGHT BRACHIOCEPHALIC ARTERIOVENOUS (AV) FISTULA CREATION;  Surgeon: Angelia Mould, MD;  Location: [place];  Service: Vascular;  Laterality: Right;  . CARDIAC CATHETERIZATION    . CARPAL TUNNEL RELEASE     left hand  . COLONOSCOPY     with polypectomy  . CORONARY ARTERY BYPASS GRAFT N/A 04/16/2019   Procedure: CORONARY ARTERY BYPASS GRAFTING (CABG)X3  , WITH ENDOSCOPIC HARVESTING OF RIGHT GREATER SAPHENOUS VEIN;  Surgeon: Lajuana Matte, MD;  Location: [place];  Service: Open Heart Surgery;  Laterality: N/A;  . CORONARY/GRAFT ACUTE MI REVASCULARIZATION N/A 04/16/2019   Procedure: CORONARY/GRAFT ACUTE MI REVASCULARIZATION;  Surgeon: Burnell Blanks, MD;  Location: [place] [facility];  Service: Cardiovascular;  Laterality: N/A;  . INSERTION OF DIALYSIS CATHETER N/A 04/29/2019   Procedure: INSERTION OF TUNNELED DIALYSIS CATHETER, right internal jugular;  Surgeon: Rosetta Posner, MD;  Location: [place];  Service: Vascular;  Laterality: N/A;  . IR US GUIDE BX ASP/DRAIN  12/29/2019  . JOINT REPLACEMENT Left 2005   knee  . LUMBAR LAMINECTOMY    . MULTIPLE TOOTH EXTRACTIONS    . PERIPHERAL VASCULAR BALLOON ANGIOPLASTY Right 06/25/2019   Procedure: PERIPHERAL VASCULAR BALLOON ANGIOPLASTY;  Surgeon: Marty Heck, MD;  Location:  [place] [facility];  Service: Cardiovascular;  Laterality: Right;  . RADIOFREQUENCY ABLATION KIDNEY    . RIGHT/LEFT HEART CATH AND CORONARY ANGIOGRAPHY N/A 04/16/2019   Procedure: RIGHT/LEFT HEART CATH AND CORONARY ANGIOGRAPHY;  Surgeon: Burnell Blanks, MD;  Location: [place] [facility];  Service: Cardiovascular;  Laterality: N/A;  . TEE WITHOUT CARDIOVERSION N/A 04/16/2019   Procedure: TRANSESOPHAGEAL ECHOCARDIOGRAM (TEE);  Surgeon: Lajuana Matte, MD;  Location: [place];  Service: Open Heart Surgery;  Laterality: N/A;  . TEE WITHOUT CARDIOVERSION N/A 10/08/2019   Procedure: TRANSESOPHAGEAL ECHOCARDIOGRAM (TEE);  Surgeon: Larey Dresser, MD;  Location: [facility];  Service: Cardiovascular;  Laterality: N/A;  . TOTAL KNEE ARTHROPLASTY     left  . VENTRICULAR ASSIST DEVICE INSERTION N/A 04/16/2019   Procedure: VENTRICULAR ASSIST DEVICE INSERTION;  Surgeon: Burnell Blanks, MD;  Location: [place] [facility];  Service: Cardiovascular;  Laterality: N/A;    Social History   Tobacco Use  . Smoking status: Former Smoker    Quit date: 07/23/1980    Years since quitting: 39.4  . Smokeless tobacco: Never Used  Substance Use Topics  . Alcohol use: Yes    Alcohol/week: 1.0 standard drink    Types: 1 Cans of beer per week    Comment: rare    Family History  Problem Relation Age of Onset    . Hypertension Other   . Diabetes Sister   . Hyperlipidemia Sister   . Sleep apnea Sister     Review of Systems  Constitutional: Negative for chills and fever.  Respiratory: Negative for cough and shortness of breath.   Cardiovascular: Positive for leg swelling. Negative for chest pain and palpitations.  Gastrointestinal: Positive for nausea and vomiting. Negative for abdominal pain.  per hpi   OBJECTIVE:  Today's Vitals   01/01/20 1348  BP: 95/62  Pulse: 66  Temp: 98.1 F (36.7 C)  SpO2: 95%  Weight: 299 lb (135.6 kg)  Height: 6' (1.829 m)   Body mass index is 40.55 kg/m.   Physical Exam Vitals and nursing note reviewed.  Constitutional:      Appearance: He is well-developed.  HENT:     Head: Normocephalic and atraumatic.  Eyes:     Conjunctiva/sclera: Conjunctivae normal.     Pupils: Pupils are equal, round, and reactive to light.  Pulmonary:     Effort: Pulmonary effort is normal.  Abdominal:     Hernia: No hernia is present.  Musculoskeletal:     Cervical back: Neck supple.     Right hip: Tenderness (anterior groin) present. No bony tenderness. Normal range of motion (but pain with flexion). Normal strength.     Right lower leg: 1+ Pitting Edema present.     Comments: Anterior medial shin, below knee, small area of induration seen inferior to were IR drained abscess, no fluctuance, no warmth, no erythema, no TTP  Skin:    General: Skin is warm and dry.  Neurological:     Mental Status: He is alert and oriented to person, place, and time.     No results found for this or any previous visit (from the past 24 hour(s)).  No results found.   ASSESSMENT and PLAN  1. Right groin pain MSK. Discussed supportive measures, trial of muscle relaxant, reviewed r/se/b. vicodin for severe pain. Discussed not taking together.   2. Nausea Post HD. Discussed supportive measures. Discussed low dose phenergan if zofran not providing relief. Reviewed r/se/b.  3.  Cellulitis of  leg, right Complete doxy, warm compresses to abscess, recheck for possible I+D, will have to stop eliquis prior.   4. Essential hypertension 5. Decreased vision in both eyes - Ambulatory referral to Ophthalmology  Other orders - doxycycline (VIBRAMYCIN) 100 MG capsule; Take 100 mg by mouth 2 (two) times daily. - methocarbamol (ROBAXIN) 500 MG tablet; Take 1 tablet (500 mg total) by mouth every 8 (eight) hours as needed for muscle spasms. - promethazine (PHENERGAN) 12.5 MG tablet; Take 1 tablet (12.5 mg total) by mouth every 8 (eight) hours as needed for nausea or vomiting. - HYDROcodone-acetaminophen (NORCO/VICODIN) 5-325 MG tablet; Take 1 tablet by mouth every 6 (six) hours as needed for moderate pain.  Return in about 1 week (around 01/08/2020) for possible I+D.    [name], MD [facility] at [facility] [place], [place] [address] Ph.  [phone] Fax [phone]

## 2020-01-01 NOTE — Patient Instructions (Signed)
pcp    If you have lab work done today you will be contacted with your lab results within the next 2 weeks.  If you have not heard from [place] then please contact us. The fastest way to get your results is to register for My Chart.   IF you received an x-ray today, you will receive an invoice from [facility]. Please contact [facility] at [phone] with questions or concerns regarding your invoice.   IF you received labwork today, you will receive an invoice from [facility]. Please contact [facility] at [phone] with questions or concerns regarding your invoice.   Our billing staff will not be able to assist you with questions regarding bills from these companies.  You will be contacted with the lab results as soon as they are available. The fastest way to get your results is to activate your My Chart account. Instructions are located on the last page of this paperwork. If you have not heard from [place] regarding the results in 2 weeks, please contact this office.

## 2020-01-02 LAB — CULTURE, BLOOD (ROUTINE X 2)
Culture: NO GROWTH
Culture: NO GROWTH
Special Requests: ADEQUATE
Special Requests: ADEQUATE

## 2020-01-03 LAB — AEROBIC/ANAEROBIC CULTURE W GRAM STAIN (SURGICAL/DEEP WOUND)

## 2020-01-21 ENCOUNTER — Emergency Department (HOSPITAL_BASED_OUTPATIENT_CLINIC_OR_DEPARTMENT_OTHER): Payer: Medicare Other

## 2020-01-21 ENCOUNTER — Other Ambulatory Visit: Payer: Self-pay

## 2020-01-21 ENCOUNTER — Ambulatory Visit (INDEPENDENT_AMBULATORY_CARE_PROVIDER_SITE_OTHER): Payer: Medicare Other | Admitting: Family Medicine

## 2020-01-21 ENCOUNTER — Encounter (HOSPITAL_COMMUNITY): Payer: Self-pay

## 2020-01-21 ENCOUNTER — Inpatient Hospital Stay (HOSPITAL_COMMUNITY)
Admission: EM | Admit: 2020-01-21 | Discharge: 2020-01-28 | DRG: 602 | Disposition: A | Discharge status: Home / Self Care | Payer: Medicare Other | Attending: Internal Medicine | Admitting: Internal Medicine

## 2020-01-21 ENCOUNTER — Encounter: Payer: Self-pay | Admitting: Family Medicine

## 2020-01-21 VITALS — BP 93/59 | HR 72 | Temp 98.5°F | Ht 72.0 in | Wt 291.0 lb

## 2020-01-21 DIAGNOSIS — L0390 Cellulitis, unspecified: Secondary | ICD-10-CM | POA: Diagnosis present

## 2020-01-21 DIAGNOSIS — L538 Other specified erythematous conditions: Secondary | ICD-10-CM

## 2020-01-21 DIAGNOSIS — L03115 Cellulitis of right lower limb: Secondary | ICD-10-CM | POA: Diagnosis present

## 2020-01-21 DIAGNOSIS — M79609 Pain in unspecified limb: Secondary | ICD-10-CM

## 2020-01-21 DIAGNOSIS — N186 End stage renal disease: Secondary | ICD-10-CM

## 2020-01-21 DIAGNOSIS — I10 Essential (primary) hypertension: Secondary | ICD-10-CM | POA: Diagnosis not present

## 2020-01-21 DIAGNOSIS — Z8249 Family history of ischemic heart disease and other diseases of the circulatory system: Secondary | ICD-10-CM

## 2020-01-21 DIAGNOSIS — N184 Chronic kidney disease, stage 4 (severe): Secondary | ICD-10-CM | POA: Diagnosis not present

## 2020-01-21 DIAGNOSIS — M109 Gout, unspecified: Secondary | ICD-10-CM | POA: Diagnosis present

## 2020-01-21 DIAGNOSIS — Z6841 Body Mass Index (BMI) 40.0 and over, adult: Secondary | ICD-10-CM

## 2020-01-21 DIAGNOSIS — I252 Old myocardial infarction: Secondary | ICD-10-CM | POA: Diagnosis not present

## 2020-01-21 DIAGNOSIS — D631 Anemia in chronic kidney disease: Secondary | ICD-10-CM | POA: Diagnosis present

## 2020-01-21 DIAGNOSIS — Z992 Dependence on renal dialysis: Secondary | ICD-10-CM | POA: Diagnosis not present

## 2020-01-21 DIAGNOSIS — I482 Chronic atrial fibrillation, unspecified: Secondary | ICD-10-CM

## 2020-01-21 DIAGNOSIS — I120 Hypertensive chronic kidney disease with stage 5 chronic kidney disease or end stage renal disease: Secondary | ICD-10-CM | POA: Diagnosis present

## 2020-01-21 DIAGNOSIS — I2510 Atherosclerotic heart disease of native coronary artery without angina pectoris: Secondary | ICD-10-CM | POA: Diagnosis present

## 2020-01-21 DIAGNOSIS — R0600 Dyspnea, unspecified: Secondary | ICD-10-CM

## 2020-01-21 DIAGNOSIS — N2581 Secondary hyperparathyroidism of renal origin: Secondary | ICD-10-CM | POA: Diagnosis present

## 2020-01-21 DIAGNOSIS — Z885 Allergy status to narcotic agent status: Secondary | ICD-10-CM

## 2020-01-21 DIAGNOSIS — Z789 Other specified health status: Secondary | ICD-10-CM

## 2020-01-21 DIAGNOSIS — K219 Gastro-esophageal reflux disease without esophagitis: Secondary | ICD-10-CM

## 2020-01-21 DIAGNOSIS — Z7901 Long term (current) use of anticoagulants: Secondary | ICD-10-CM

## 2020-01-21 DIAGNOSIS — G4733 Obstructive sleep apnea (adult) (pediatric): Secondary | ICD-10-CM | POA: Diagnosis present

## 2020-01-21 DIAGNOSIS — Z96652 Presence of left artificial knee joint: Secondary | ICD-10-CM | POA: Diagnosis present

## 2020-01-21 DIAGNOSIS — Z83438 Family history of other disorder of lipoprotein metabolism and other lipidemia: Secondary | ICD-10-CM

## 2020-01-21 DIAGNOSIS — M5090 Cervical disc disorder, unspecified, unspecified cervical region: Secondary | ICD-10-CM | POA: Diagnosis present

## 2020-01-21 DIAGNOSIS — Z87891 Personal history of nicotine dependence: Secondary | ICD-10-CM

## 2020-01-21 DIAGNOSIS — I872 Venous insufficiency (chronic) (peripheral): Secondary | ICD-10-CM | POA: Diagnosis present

## 2020-01-21 DIAGNOSIS — Z20822 Contact with and (suspected) exposure to covid-19: Secondary | ICD-10-CM | POA: Diagnosis present

## 2020-01-21 DIAGNOSIS — M79604 Pain in right leg: Secondary | ICD-10-CM

## 2020-01-21 DIAGNOSIS — Z86010 Personal history of colonic polyps: Secondary | ICD-10-CM | POA: Diagnosis not present

## 2020-01-21 DIAGNOSIS — R0609 Other forms of dyspnea: Secondary | ICD-10-CM

## 2020-01-21 DIAGNOSIS — Z79899 Other long term (current) drug therapy: Secondary | ICD-10-CM

## 2020-01-21 DIAGNOSIS — Z951 Presence of aortocoronary bypass graft: Secondary | ICD-10-CM

## 2020-01-21 DIAGNOSIS — Z7982 Long term (current) use of aspirin: Secondary | ICD-10-CM

## 2020-01-21 DIAGNOSIS — E7800 Pure hypercholesterolemia, unspecified: Secondary | ICD-10-CM | POA: Diagnosis present

## 2020-01-21 DIAGNOSIS — Z9115 Patient's noncompliance with renal dialysis: Secondary | ICD-10-CM

## 2020-01-21 LAB — BASIC METABOLIC PANEL
Anion gap: 14 (ref 5–15)
BUN: 19 mg/dL (ref 8–23)
CO2: 31 mmol/L (ref 22–32)
Calcium: 10 mg/dL (ref 8.9–10.3)
Chloride: 92 mmol/L — ABNORMAL LOW (ref 98–111)
Creatinine, Ser: 6.56 mg/dL — ABNORMAL HIGH (ref 0.61–1.24)
GFR calc Af Amer: 9 mL/min — ABNORMAL LOW (ref >60)
GFR calc non Af Amer: 8 mL/min — ABNORMAL LOW (ref >60)
Glucose, Bld: 93 mg/dL (ref 70–99)
Potassium: 4 mmol/L (ref 3.5–5.1)
Sodium: 137 mmol/L (ref 135–145)

## 2020-01-21 LAB — CBC WITH DIFFERENTIAL/PLATELET
Abs Immature Granulocytes: 0.08 10*3/uL — ABNORMAL HIGH (ref 0.00–0.07)
Basophils Absolute: 0 10*3/uL (ref 0.0–0.1)
Basophils Relative: 0 %
Eosinophils Absolute: 0 10*3/uL (ref 0.0–0.5)
Eosinophils Relative: 0 %
HCT: 38.7 % — ABNORMAL LOW (ref 39.0–52.0)
Hemoglobin: 11.7 g/dL — ABNORMAL LOW (ref 13.0–17.0)
Immature Granulocytes: 1 %
Lymphocytes Relative: 7 %
Lymphs Abs: 1.1 10*3/uL (ref 0.7–4.0)
MCH: 27.7 pg (ref 26.0–34.0)
MCHC: 30.2 g/dL (ref 30.0–36.0)
MCV: 91.7 fL (ref 80.0–100.0)
Monocytes Absolute: 1.2 10*3/uL — ABNORMAL HIGH (ref 0.1–1.0)
Monocytes Relative: 8 %
Neutro Abs: 12.9 10*3/uL — ABNORMAL HIGH (ref 1.7–7.7)
Neutrophils Relative %: 84 %
Platelets: 147 10*3/uL — ABNORMAL LOW (ref 150–400)
RBC: 4.22 MIL/uL (ref 4.22–5.81)
RDW: 20.9 % — ABNORMAL HIGH (ref 11.5–15.5)
WBC: 15.3 10*3/uL — ABNORMAL HIGH (ref 4.0–10.5)
nRBC: 0 % (ref 0.0–0.2)

## 2020-01-21 MED ORDER — HYDROCODONE-ACETAMINOPHEN 5-325 MG PO TABS
1.0000 | ORAL_TABLET | Freq: Four times a day (QID) | ORAL | Status: DC | PRN
  Administered 2020-01-22 – 2020-01-25 (×7): 1 via ORAL
  Filled 2020-01-21 (×7): qty 1

## 2020-01-21 MED ORDER — CINACALCET HCL 30 MG PO TABS
30.0000 mg | ORAL_TABLET | Freq: Every day | ORAL | Status: DC
  Administered 2020-01-22 – 2020-01-23 (×2): 30 mg via ORAL
  Filled 2020-01-21 (×2): qty 1

## 2020-01-21 MED ORDER — BISOPROLOL FUMARATE 5 MG PO TABS
5.0000 mg | ORAL_TABLET | ORAL | Status: DC
  Administered 2020-01-23 – 2020-01-28 (×4): 5 mg via ORAL
  Filled 2020-01-21 (×5): qty 1

## 2020-01-21 MED ORDER — PANTOPRAZOLE SODIUM 40 MG PO TBEC: 40.0000 mg | DELAYED_RELEASE_TABLET | Freq: Every day | ORAL | 3 refills | Status: DC | PRN

## 2020-01-21 MED ORDER — HYDROCODONE-ACETAMINOPHEN 5-325 MG PO TABS
1.0000 | ORAL_TABLET | Freq: Once | ORAL | Status: AC
  Administered 2020-01-21: 1 via ORAL
  Filled 2020-01-21: qty 1

## 2020-01-21 MED ORDER — SODIUM CHLORIDE 0.9 % IV SOLN
1.0000 g | Freq: Once | INTRAVENOUS | Status: DC
  Filled 2020-01-21: qty 10

## 2020-01-21 MED ORDER — PANTOPRAZOLE SODIUM 40 MG PO TBEC
40.0000 mg | DELAYED_RELEASE_TABLET | Freq: Every day | ORAL | Status: DC | PRN
  Administered 2020-01-24: 40 mg via ORAL
  Filled 2020-01-21: qty 1

## 2020-01-21 MED ORDER — VANCOMYCIN HCL 2000 MG/400ML IV SOLN
2000.0000 mg | Freq: Once | INTRAVENOUS | Status: AC
  Administered 2020-01-22: 2000 mg via INTRAVENOUS
  Filled 2020-01-21: qty 400

## 2020-01-21 MED ORDER — CINACALCET HCL 30 MG PO TABS: 30.0000 mg | ORAL_TABLET | Freq: Every day | ORAL | 0 refills | Status: DC

## 2020-01-21 MED ORDER — VANCOMYCIN HCL IN DEXTROSE 1-5 GM/200ML-% IV SOLN: 1000.0000 mg | Freq: Once | INTRAVENOUS | Status: DC

## 2020-01-21 MED ORDER — APIXABAN 5 MG PO TABS
5.0000 mg | ORAL_TABLET | Freq: Two times a day (BID) | ORAL | Status: DC
  Administered 2020-01-22 – 2020-01-28 (×13): 5 mg via ORAL
  Filled 2020-01-21 (×13): qty 1

## 2020-01-21 MED ORDER — MELATONIN 3 MG PO TABS
3.0000 mg | ORAL_TABLET | Freq: Once | ORAL | Status: AC
  Administered 2020-01-22: 3 mg via ORAL
  Filled 2020-01-21: qty 1

## 2020-01-21 MED ORDER — PIPERACILLIN-TAZOBACTAM IN DEX 2-0.25 GM/50ML IV SOLN
2.2500 g | Freq: Three times a day (TID) | INTRAVENOUS | Status: DC
  Administered 2020-01-21 – 2020-01-27 (×17): 2.25 g via INTRAVENOUS
  Filled 2020-01-21 (×19): qty 50

## 2020-01-21 MED ORDER — HEPARIN SODIUM (PORCINE) 5000 UNIT/ML IJ SOLN
5000.0000 [IU] | Freq: Three times a day (TID) | INTRAMUSCULAR | Status: DC
  Administered 2020-01-21: 5000 [IU] via SUBCUTANEOUS
  Filled 2020-01-21: qty 1

## 2020-01-21 MED ORDER — ONDANSETRON HCL 4 MG/2ML IJ SOLN: 4.0000 mg | Freq: Once | INTRAMUSCULAR | Status: DC

## 2020-01-21 MED ORDER — VANCOMYCIN HCL IN DEXTROSE 1-5 GM/200ML-% IV SOLN
1000.0000 mg | Freq: Once | INTRAVENOUS | Status: DC
Start: 2020-01-22 — End: 2020-01-21

## 2020-01-21 MED ORDER — PIPERACILLIN-TAZOBACTAM 3.375 G IVPB 30 MIN
3.3750 g | Freq: Once | INTRAVENOUS | Status: DC
Start: 2020-01-22 — End: 2020-01-21

## 2020-01-21 MED ORDER — ALLOPURINOL 100 MG PO TABS
100.0000 mg | ORAL_TABLET | Freq: Every day | ORAL | Status: DC
  Administered 2020-01-22 – 2020-01-28 (×7): 100 mg via ORAL
  Filled 2020-01-21 (×7): qty 1

## 2020-01-21 MED ORDER — VANCOMYCIN HCL IN DEXTROSE 1-5 GM/200ML-% IV SOLN
1000.0000 mg | INTRAVENOUS | Status: DC
  Administered 2020-01-22: 1000 mg via INTRAVENOUS
  Filled 2020-01-21 (×3): qty 200

## 2020-01-21 MED ORDER — CALCITRIOL 0.25 MCG PO CAPS
0.2500 ug | ORAL_CAPSULE | ORAL | Status: DC
  Administered 2020-01-22: 0.25 ug via ORAL
  Filled 2020-01-21: qty 1

## 2020-01-21 MED ORDER — ASPIRIN EC 81 MG PO TBEC
81.0000 mg | DELAYED_RELEASE_TABLET | Freq: Every day | ORAL | Status: DC
  Administered 2020-01-22 – 2020-01-28 (×7): 81 mg via ORAL
  Filled 2020-01-21 (×7): qty 1

## 2020-01-21 MED ORDER — MIDODRINE HCL 5 MG PO TABS
10.0000 mg | ORAL_TABLET | ORAL | Status: DC
  Administered 2020-01-22 – 2020-01-28 (×3): 10 mg via ORAL
  Filled 2020-01-21 (×4): qty 2

## 2020-01-21 MED ORDER — ROSUVASTATIN CALCIUM 5 MG PO TABS
10.0000 mg | ORAL_TABLET | Freq: Every day | ORAL | Status: DC
  Administered 2020-01-22 – 2020-01-28 (×7): 10 mg via ORAL
  Filled 2020-01-21 (×7): qty 2

## 2020-01-21 MED ORDER — FENTANYL CITRATE (PF) 100 MCG/2ML IJ SOLN: 50.0000 ug | Freq: Once | INTRAMUSCULAR | Status: DC

## 2020-01-21 MED ORDER — AMIODARONE HCL 200 MG PO TABS
200.0000 mg | ORAL_TABLET | Freq: Every day | ORAL | Status: DC
  Administered 2020-01-22 – 2020-01-28 (×7): 200 mg via ORAL
  Filled 2020-01-21 (×7): qty 1

## 2020-01-21 NOTE — H&P (Signed)
History and Physical  Alex Williams YSA:[id number] DOB: [birth year] DOA: 01/21/2020  Referring physician: Cathlean Cower PCP: [name], MD  Patient coming from: Home  Chief Complaint: Leg pain and swelling  HPI: Alex Williams is a 69 y.o. 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 fib on Eliquis, OSA and gout who presents to the emergency department due to worsening right lower extremity swelling and redness.  Patient recently had cellulitis to the right lower extremity since last 3 to 4 months and he was admitted to this facility on 6/7-6/8 due to RLE abscess during which he was treated with IV vancomycin and I&D with done at that time and patient was discharged with doxycycline.  Since discharge, he has had another round of oral doxycycline due to worsening rash on right leg which started with small round red rash on the medial side of the leg, this has since spread to the ankle as well as right thigh.  He was evaluated by his primary care provider earlier today who recommended that due to his symptoms he should go to the ED for further evaluation and management.  To rule out DVT of the RLE.  Patient admitted to nausea and vomiting after dialysis yesterday, but denies fever, chills, chest pain, shortness of breath, abdominal pain.  ED Course:  In the ED emergency department stable.  Work-up in the ED showed leukocytosis and creatinine of 6.56 (baseline appears to be 7.0-8.5).  Right lower extremity ultrasound no evidence of DVT.  Norco 5-3 25-325 mg x 1 was given, patient was started on IV vancomycin.  Hospitalist was asked to admit patient for further evaluation and management.  Review of Systems: Constitutional: Negative for chills and fever.  HENT: Negative for ear pain and sore throat.   Eyes: Negative for pain and visual disturbance.  Respiratory: Negative for cough, chest tightness and shortness of  breath.   Cardiovascular: Negative for chest pain and palpitations.  Gastrointestinal: Negative for abdominal pain and vomiting.  Endocrine: Negative for polyphagia and polyuria.  Genitourinary: Negative for decreased urine volume, dysuria Musculoskeletal: Right lower leg swelling and with red rash, warm and tender to touch. Negative for arthralgias and back pain.  Skin: Negative for color change and rash.  Allergic/Immunologic: Negative for immunocompromised state.  Neurological: Negative for tremors, syncope, speech difficulty, weakness, light-headedness and headaches.  Hematological: Does not bruise/bleed easily.     Past Medical History:  Diagnosis Date  . Cervical disc disease   . Chronic kidney disease   . COLONIC POLYPS, HX OF 06/27/2007  . EXOGENOUS OBESITY 01/30/2010  . GERD (gastroesophageal reflux disease)    PMH  . GOUT 01/30/2010  . HYPERCHOLESTEROLEMIA 06/30/2007  . HYPERTENSION 06/27/2007  . LOW BACK PAIN 06/27/2007  . NEPHROLITHIASIS, HX OF 06/27/2007  . OSTEOARTHRITIS 06/27/2007  . SLEEP APNEA, OBSTRUCTIVE, MODERATE 01/27/2009  . Wears dentures    upper   Past Surgical History:  Procedure Laterality Date  . AV FISTULA PLACEMENT Right 01/19/2019   Procedure: RIGHT BRACHIOCEPHALIC ARTERIOVENOUS (AV) FISTULA CREATION;  Surgeon: Angelia Mould, MD;  Location: [facility];  Service: Vascular;  Laterality: Right;  . CARDIAC CATHETERIZATION    . CARPAL TUNNEL RELEASE     left hand  . COLONOSCOPY     with polypectomy  . CORONARY ARTERY BYPASS GRAFT N/A 04/16/2019   Procedure: CORONARY ARTERY BYPASS GRAFTING (CABG)X3  , WITH ENDOSCOPIC HARVESTING OF RIGHT GREATER SAPHENOUS VEIN;  Surgeon: Lajuana Matte, MD;  Location: [facility];  Service: Open Heart Surgery;  Laterality: N/A;  . CORONARY/GRAFT ACUTE MI REVASCULARIZATION N/A 04/16/2019   Procedure: CORONARY/GRAFT ACUTE MI REVASCULARIZATION;  Surgeon: Burnell Blanks, MD;  Location: [facility];  Service:  Cardiovascular;  Laterality: N/A;  . INSERTION OF DIALYSIS CATHETER N/A 04/29/2019   Procedure: INSERTION OF TUNNELED DIALYSIS CATHETER, right internal jugular;  Surgeon: Rosetta Posner, MD;  Location: [facility];  Service: Vascular;  Laterality: N/A;  . IR US GUIDE BX ASP/DRAIN  12/29/2019  . JOINT REPLACEMENT Left 2005   knee  . LUMBAR LAMINECTOMY    . MULTIPLE TOOTH EXTRACTIONS    . PERIPHERAL VASCULAR BALLOON ANGIOPLASTY Right 06/25/2019   Procedure: PERIPHERAL VASCULAR BALLOON ANGIOPLASTY;  Surgeon: Marty Heck, MD;  Location: [facility];  Service: Cardiovascular;  Laterality: Right;  . RADIOFREQUENCY ABLATION KIDNEY    . RIGHT/LEFT HEART CATH AND CORONARY ANGIOGRAPHY N/A 04/16/2019   Procedure: RIGHT/LEFT HEART CATH AND CORONARY ANGIOGRAPHY;  Surgeon: Burnell Blanks, MD;  Location: [facility];  Service: Cardiovascular;  Laterality: N/A;  . TEE WITHOUT CARDIOVERSION N/A 04/16/2019   Procedure: TRANSESOPHAGEAL ECHOCARDIOGRAM (TEE);  Surgeon: Lajuana Matte, MD;  Location: [facility];  Service: Open Heart Surgery;  Laterality: N/A;  . TEE WITHOUT CARDIOVERSION N/A 10/08/2019   Procedure: TRANSESOPHAGEAL ECHOCARDIOGRAM (TEE);  Surgeon: Larey Dresser, MD;  Location: [facility];  Service: Cardiovascular;  Laterality: N/A;  . TOTAL KNEE ARTHROPLASTY     left  . VENTRICULAR ASSIST DEVICE INSERTION N/A 04/16/2019   Procedure: VENTRICULAR ASSIST DEVICE INSERTION;  Surgeon: Burnell Blanks, MD;  Location: [facility];  Service: Cardiovascular;  Laterality: N/A;    Social History:  reports that he quit smoking about 39 years ago. He has never used smokeless tobacco. He reports current alcohol use of about 1.0 standard drink of alcohol per week. He reports that he does not use drugs.   Allergies  Allergen Reactions  . Codeine Phosphate Other (See Comments)    Hyperactive     Family History  Problem Relation Age of Onset  . Hypertension Other   .  Diabetes Sister   . Hyperlipidemia Sister   . Sleep apnea Sister      Prior to Admission medications   Medication Sig Start Date End Date Taking? Authorizing Provider  allopurinol (ZYLOPRIM) 100 MG tablet Take 1 tablet (100 mg total) by mouth daily. 05/08/19  Yes Angiulli, Lavon Paganini, PA-C  amiodarone (PACERONE) 200 MG tablet Take 200 mg by mouth daily. 12/27/19  Yes [provider]  aspirin EC 81 MG EC tablet Take 1 tablet (81 mg total) by mouth daily. 05/01/19  Yes Conte, Tessa N, PA-C  bisoprolol (ZEBETA) 5 MG tablet TAKE 1 TABLET AS DIRECTED ON NON DIALYSIS TUES,THURS,SAT,SUN NEEDS APPT 11/26/19   Larey Dresser, MD  calcitRIOL (ROCALTROL) 0.25 MCG capsule Take 1 capsule (0.25 mcg total) by mouth every Monday, Wednesday, and Friday with hemodialysis. 12/30/19   Seawell, Jaimie A, DO  cinacalcet (SENSIPAR) 30 MG tablet Take 1 tablet (30 mg total) by mouth daily with supper. 01/21/20   Posey Boyer, MD  cinacalcet (SENSIPAR) 90 MG tablet SMARTSIG:1 Tablet(s) By Mouth Every Evening 01/15/20   [provider]  diphenhydramine-acetaminophen (TYLENOL PM) 25-500 MG TABS tablet Take 1 tablet by mouth at bedtime as needed.    [provider]  doxycycline (VIBRA-TABS) 100 MG tablet Take 1 tablet (100  mg total) by mouth 2 (two) times daily. 12/29/19   Seawell, Jaimie A, DO  doxycycline (VIBRAMYCIN) 100 MG capsule Take 100 mg by mouth 2 (two) times daily. 12/31/19   [provider]  ELIQUIS 5 MG TABS tablet TAKE 1 TABLET BY MOUTH TWICE A DAY 10/21/19   Larey Dresser, MD  ferric citrate (AURYXIA) 1 GM 210 MG(Fe) tablet Take 420 mg by mouth 3 (three) times daily with meals.     [provider]  fluticasone (FLONASE) 50 MCG/ACT nasal spray Place 1 spray into both nostrils daily as needed for allergies or rhinitis.    [provider]  gabapentin (NEURONTIN) 100 MG capsule TAKE 1 CAPSULE AT BEDTIME ON HD DAYS (M,W,F) 12/17/19   [name], MD    HYDROcodone-acetaminophen (NORCO/VICODIN) 5-325 MG tablet Take 1 tablet by mouth every 6 (six) hours as needed for moderate pain. 01/01/20   [name], MD  methocarbamol (ROBAXIN) 500 MG tablet Take 1 tablet (500 mg total) by mouth every 8 (eight) hours as needed for muscle spasms. 01/01/20   [name], MD  midodrine (PROAMATINE) 10 MG tablet Take 1 tablet (10 mg total) by mouth every Monday, Wednesday, and Friday with hemodialysis. 05/08/19   Angiulli, Lavon Paganini, PA-C  multivitamin (RENA-VIT) TABS tablet Take 1 tablet by mouth at bedtime. 05/07/19   Angiulli, Lavon Paganini, PA-C  ondansetron (ZOFRAN-ODT) 8 MG disintegrating tablet Take 1 tablet (8 mg total) by mouth every 8 (eight) hours as needed for nausea or vomiting. 10/20/19   [name], MD  pantoprazole (PROTONIX) 40 MG tablet Take 1 tablet (40 mg total) by mouth daily as needed (acid reflux). 01/21/20   Posey Boyer, MD  promethazine (PHENERGAN) 12.5 MG tablet Take 1 tablet (12.5 mg total) by mouth every 8 (eight) hours as needed for nausea or vomiting. 01/01/20   [name], MD  rosuvastatin (CRESTOR) 10 MG tablet Take 1 tablet (10 mg total) by mouth daily. 09/29/19 12/31/19  Larey Dresser, MD    Physical Exam: BP 123/75 (BP Location: Right Arm)   Pulse 73   Temp 98.7 F (37.1 C) (Oral)   Resp 18   Ht 6' (1.829 m)   Wt 132 kg   SpO2 97%   BMI 39.47 kg/m   . General: 69 y.o. year-old male well developed well nourished in no acute distress.  Alert and oriented x3. Marland Kitchen HEENT: Normocephalic, atraumatic . Neck: Supple, trachea medial . Cardiovascular: Regular rate and rhythm with no rubs or gallops.  No thyromegaly or JVD noted.  No lower extremity edema. 2/4 pulses in all 4 extremities. Marland Kitchen Respiratory: Clear to auscultation with no wheezes or rales. Good inspiratory effort. . Abdomen: Soft nontender nondistended with normal bowel sounds x4 quadrants. . Muskuloskeletal:  Right lower extremity edema and erythema, warm  and tender to touch.No cyanosis, clubbing or edema noted bilaterally . Neuro: CN II-XII intact, strength, sensation, reflexes . Skin: No ulcerative lesions noted or rashes . Psychiatry: Judgement and insight appear normal. Mood is appropriate for condition and setting          Labs on Admission:  Basic Metabolic Panel: Recent Labs  Lab 01/21/20 1345  NA 137  K 4.0  CL 92*  CO2 31  GLUCOSE 93  BUN 19  CREATININE 6.56*  CALCIUM 10.0   Liver Function Tests: No results for input(s): AST, ALT, ALKPHOS, BILITOT, PROT, ALBUMIN in the last 168 hours. No results for input(s): LIPASE,  AMYLASE in the last 168 hours. No results for input(s): AMMONIA in the last 168 hours. CBC: Recent Labs  Lab 01/21/20 1345  WBC 15.3*  NEUTROABS 12.9*  HGB 11.7*  HCT 38.7*  MCV 91.7  PLT 147*   Cardiac Enzymes: No results for input(s): CKTOTAL, CKMB, CKMBINDEX, TROPONINI in the last 168 hours.  BNP (last 3 results) Recent Labs    01/30/19 0819 02/05/19 0951  BNP 1,138.2* 1,186.2*    ProBNP (last 3 results) No results for input(s): PROBNP in the last 8760 hours.  CBG: No results for input(s): GLUCAP in the last 168 hours.  Radiological Exams on Admission: VAS [place] LOWER EXTREMITY VENOUS (DVT) (ONLY [facility] & [facility])  Result Date: 01/21/2020  Lower Venous DVTStudy Indications: Worsening erythema and pain on thigh, erythema of calf x 1 month.  Other Factors: Cellulitis on antibiotics, Hx GSV harvest. Limitations: Body habitus and patient pain level. Comparison Study: 12/28/19 negative Performing Technologist: June Leap RDMS, RVT  Examination Guidelines: A complete evaluation includes B-mode imaging, spectral Doppler, color Doppler, and power Doppler as needed of all accessible portions of each vessel. Bilateral testing is considered an integral part of a complete examination. Limited examinations for reoccurring indications may be performed as noted. The reflux portion of the exam is performed with the  patient in reverse Trendelenburg.  +---------+---------------+---------+-----------+----------+--------------+ RIGHT    CompressibilityPhasicitySpontaneityPropertiesThrombus Aging +---------+---------------+---------+-----------+----------+--------------+ CFV      Full           Yes      Yes                                 +---------+---------------+---------+-----------+----------+--------------+ SFJ      Full                                                        +---------+---------------+---------+-----------+----------+--------------+ FV Prox  Full                                                        +---------+---------------+---------+-----------+----------+--------------+ FV Mid   Full                                                        +---------+---------------+---------+-----------+----------+--------------+ FV DistalFull                                                        +---------+---------------+---------+-----------+----------+--------------+ PFV      Full                                                        +---------+---------------+---------+-----------+----------+--------------+ POP  Full           Yes      Yes                                 +---------+---------------+---------+-----------+----------+--------------+ PTV      Full                                                        +---------+---------------+---------+-----------+----------+--------------+ PERO     Full                                                        +---------+---------------+---------+-----------+----------+--------------+   +----+---------------+---------+-----------+----------+------------------------+ LEFTCompressibilityPhasicitySpontaneityProperties                         +----+---------------+---------+-----------+----------+------------------------+ CFV                                              not imaged due to                                                          patient positioning      +----+---------------+---------+-----------+----------+------------------------+     Summary: RIGHT: - There is no evidence of deep vein thrombosis in the lower extremity.  - No cystic structure found in the popliteal fossa.   *See table(s) above for measurements and observations. Electronically signed by Servando Snare MD on 01/21/2020 at 4:32:00 PM.    Final     EKG: I independently viewed the EKG done and my findings are as followed: EKG was not done in the ED  Assessment/Plan Present on Admission: . Cellulitis . Cellulitis of right lower extremity . HYPERCHOLESTEROLEMIA . Essential hypertension . Coronary artery disease  Principal Problem:   Cellulitis of right lower extremity Active Problems:   HYPERCHOLESTEROLEMIA   Essential hypertension   Morbid obesity with BMI of 45.0-49.9, adult (HCC)   S/P CABG x 3   Coronary artery disease   ESRD on dialysis (HCC)   Cellulitis   Atrial fibrillation, chronic (HCC)   GERD (gastroesophageal reflux disease)  Cellulitis of right lower extremity Patient has had outpatient failure of RLE cellulitis Continue IV Vanco and Zosyn Continue Norco 5-325 mg every 6 hours as needed for moderate/severe pain Continue to monitor RLE for recession of rash  Essential hypertension Continue home medication when med rec is updated  Hypercholesterolemia Continue home Crestor when med rec is updated  ESRD on dialysis on MWF Continue home calcitriol and Sensipar when med rec sedated Nephrology be consulted so that patient can continue with HD while being admitted  Coronary artery disease s/p CABG x3 Continue home aspirin and statin  Atrial fibrillation chronic Continue Eliquis and bisoprolol per home regimen when med rec is updated  GERD Continue Protonix  Morbid obesity Patient will continue with outpatient PCP for weight loss control   DVT prophylaxis:  Eliquiis  Code Status: Full code  Family Communication: None at bedside  Disposition Plan:  Patient is from:                        home Anticipated DC to:                   Home Anticipated DC date:               2-3 days Anticipated DC barriers:          Response to treatment    Consults called: Nephrologist  Admission status: Inpatient due to requirement for IV antibiotics in the patient who has failed outpatient treatment of cellulitis.    Bernadette Hoit MD [facility]  If 7PM-7AM, please contact night-coverage [email or web address]  01/21/2020, 11:18 PM

## 2020-01-21 NOTE — ED Provider Notes (Signed)
[facility] EMERGENCY DEPARTMENT Provider Note   CSN: [id number] Arrival date & time: 01/21/20  1321     History Chief Complaint  Patient presents with  . Leg Swelling    Alex Williams is a 69 y.o. male.  HPI  Patient is a 69 year old male with a medical history as noted below.  Patient is on hemodialysis Monday, Wednesday, Friday.  He has been compliant with this.  He reports mild waxing and waning cellulitis to the right lower extremity for the last 3 to 4 months.  He states that about 3 to 4 days ago he felt that it was worsening once again.  He was initially having redness and swelling along the right medial lower leg.  He states that this has rapidly worsened.  In the last 24 hours he began experiencing significant edema in the right lower extremity with worsening erythema spreading up the right lower extremity to the right medial thigh.  He was prescribed doxycycline and he states he is on day 8 of 10.  He states this is the second time he has taken antibiotics for his right leg. Patient states of the last 3 to 4 weeks he felt as if he has experienced more dyspnea on exertion.  No chest pain.  He denies fevers, chills, nausea, vomiting, urinary changes, syncope.  Patient states he typically urinates 3-4 times per day.  He was evaluated by his primary care provider earlier today who recommended that due to his symptoms he come to the emergency department for DVT rule out in the right lower extremity.  Patient is anticoagulated on Eliquis.  Per records, patient was most recently admitted on June 7 for cellulitis of the right lower extremity.  He was found to have a negative Doppler at that time in the right lower extremity but they did find a fluctuant abscess on bedside ultrasound which was 2 x 1.5 cm.  He was given vancomycin and IR performed an I&D due to his anticoagulated status.  He was discharged on doxycycline at that time.      Past Medical History:  Diagnosis  Date  . Cervical disc disease   . Chronic kidney disease   . COLONIC POLYPS, HX OF 06/27/2007  . EXOGENOUS OBESITY 01/30/2010  . GERD (gastroesophageal reflux disease)    PMH  . GOUT 01/30/2010  . HYPERCHOLESTEROLEMIA 06/30/2007  . HYPERTENSION 06/27/2007  . LOW BACK PAIN 06/27/2007  . NEPHROLITHIASIS, HX OF 06/27/2007  . OSTEOARTHRITIS 06/27/2007  . SLEEP APNEA, OBSTRUCTIVE, MODERATE 01/27/2009  . Wears dentures    upper    Patient Active Problem List   Diagnosis Date Noted  . Cutaneous abscess, unspecified 12/30/2019  . Cellulitis of right lower extremity 12/28/2019  . Hypercalcemia 12/14/2019  . Primary osteoarthritis of right knee 10/29/2019  . Body mass index 40.0-44.9, adult ([place]) 10/29/2019  . Morbid obesity ([place]) 10/29/2019  . Fluid overload, unspecified 08/18/2019  . 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 Lesion of left ulnar nerve 06/26/2019  . Moderate protein-calorie malnutrition ([place]) 05/09/2019  . Hypokalemia 05/09/2019  . Recurrent knee pain 05/01/2019  . 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 Debility 04/30/2019  . ESRD on dialysis ([place])   . Hemodialysis-associated hypotension   . Atrial fibrillation with rapid ventricular response ([place])   . Anemia due to end stage renal disease ([place])   . 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 Coronary artery disease 04/16/2019  .  Acute ST elevation myocardial infarction (STEMI) involving left anterior descending (LAD) coronary artery ([place])   . Cardiogenic shock ([place])   . Acute ST elevation myocardial infarction (STEMI) of anterior wall ([place])   . Pain, unspecified 04/09/2019  . Male erectile dysfunction, unspecified 04/09/2019  . Iron deficiency anemia, unspecified 04/09/2019  . Insomnia, unspecified 04/09/2019  . Encounter for immunization 04/09/2019  . Coagulation defect, unspecified () 04/09/2019  . Acute on chronic renal failure ([place]) 08/26/2018  . Nonspecific abnormal electrocardiogram  (ECG) (EKG) 08/25/2018  . Anemia in CKD (chronic kidney disease) 08/25/2018  . Elevated troponin 08/25/2018  . SOB (shortness of breath) 08/25/2018  . Acute pulmonary edema (HCC)   . Acute renal failure superimposed on stage 5 chronic kidney disease, not on chronic dialysis ([place]) 02/13/2018  . Morbid obesity with BMI of 45.0-49.9, adult ([place]) 12/03/2013  . Renal mass 12/02/2013  . Lower extremity edema 03/16/2013  . CKD (chronic kidney disease), stage III 12/10/2011  . GOUT 01/30/2010  . Obstructive sleep apnea 01/27/2009  . HYPERCHOLESTEROLEMIA 06/30/2007  . Essential hypertension 06/27/2007  . Osteoarthritis 06/27/2007  . LOW BACK PAIN 06/27/2007  . History of colonic polyps 06/27/2007  . NEPHROLITHIASIS, HX OF 06/27/2007    Past Surgical History:  Procedure Laterality Date  . AV FISTULA PLACEMENT Right 01/19/2019   Procedure: RIGHT BRACHIOCEPHALIC ARTERIOVENOUS (AV) FISTULA CREATION;  Surgeon: Angelia Mould, MD;  Location: [facility];  Service: Vascular;  Laterality: Right;  . CARDIAC CATHETERIZATION    . CARPAL TUNNEL RELEASE     left hand  . COLONOSCOPY     with polypectomy  . CORONARY ARTERY BYPASS GRAFT N/A 04/16/2019   Procedure: CORONARY ARTERY BYPASS GRAFTING (CABG)X3  , WITH ENDOSCOPIC HARVESTING OF RIGHT GREATER SAPHENOUS VEIN;  Surgeon: Lajuana Matte, MD;  Location: [facility];  Service: Open Heart Surgery;  Laterality: N/A;  . CORONARY/GRAFT ACUTE MI REVASCULARIZATION N/A 04/16/2019   Procedure: CORONARY/GRAFT ACUTE MI REVASCULARIZATION;  Surgeon: Burnell Blanks, MD;  Location: [facility] CV LAB;  Service: Cardiovascular;  Laterality: N/A;  . INSERTION OF DIALYSIS CATHETER N/A 04/29/2019   Procedure: INSERTION OF TUNNELED DIALYSIS CATHETER, right internal jugular;  Surgeon: Rosetta Posner, MD;  Location: [facility];  Service: Vascular;  Laterality: N/A;  . IR US GUIDE BX ASP/DRAIN  12/29/2019  . JOINT REPLACEMENT Left 2005   knee  . LUMBAR LAMINECTOMY    .  MULTIPLE TOOTH EXTRACTIONS    . PERIPHERAL VASCULAR BALLOON ANGIOPLASTY Right 06/25/2019   Procedure: PERIPHERAL VASCULAR BALLOON ANGIOPLASTY;  Surgeon: Marty Heck, MD;  Location: [facility] CV LAB;  Service: Cardiovascular;  Laterality: Right;  . RADIOFREQUENCY ABLATION KIDNEY    . RIGHT/LEFT HEART CATH AND CORONARY ANGIOGRAPHY N/A 04/16/2019   Procedure: RIGHT/LEFT HEART CATH AND CORONARY ANGIOGRAPHY;  Surgeon: Burnell Blanks, MD;  Location: [facility] CV LAB;  Service: Cardiovascular;  Laterality: N/A;  . TEE WITHOUT CARDIOVERSION N/A 04/16/2019   Procedure: TRANSESOPHAGEAL ECHOCARDIOGRAM (TEE);  Surgeon: Lajuana Matte, MD;  Location: [facility];  Service: Open Heart Surgery;  Laterality: N/A;  . TEE WITHOUT CARDIOVERSION N/A 10/08/2019   Procedure: TRANSESOPHAGEAL ECHOCARDIOGRAM (TEE);  Surgeon: Larey Dresser, MD;  Location: [facility] ENDOSCOPY;  Service: Cardiovascular;  Laterality: N/A;  . TOTAL KNEE ARTHROPLASTY     left  . VENTRICULAR ASSIST DEVICE INSERTION N/A 04/16/2019   Procedure: VENTRICULAR ASSIST DEVICE INSERTION;  Surgeon: Burnell Blanks, MD;  Location: [facility] CV LAB;  Service: Cardiovascular;  Laterality: N/A;  Family History  Problem Relation Age of Onset  . Hypertension Other   . Diabetes Sister   . Hyperlipidemia Sister   . Sleep apnea Sister     Social History   Tobacco Use  . Smoking status: Former Smoker    Quit date: 07/23/1980    Years since quitting: 39.5  . Smokeless tobacco: Never Used  Vaping Use  . Vaping Use: Never used  Substance Use Topics  . Alcohol use: Yes    Alcohol/week: 1.0 standard drink    Types: 1 Cans of beer per week    Comment: rare  . Drug use: No    Home Medications Prior to Admission medications   Medication Sig Start Date End Date Taking? Authorizing Provider  allopurinol (ZYLOPRIM) 100 MG tablet Take 1 tablet (100 mg total) by mouth daily. 05/08/19   Angiulli, Lavon Paganini, PA-C  amiodarone  (PACERONE) 200 MG tablet Take 200 mg by mouth daily. 12/27/19   [provider]  aspirin EC 81 MG EC tablet Take 1 tablet (81 mg total) by mouth daily. 05/01/19   Elgie Collard, PA-C  bisoprolol (ZEBETA) 5 MG tablet TAKE 1 TABLET AS DIRECTED ON NON DIALYSIS TUES,THURS,SAT,SUN NEEDS APPT 11/26/19   Larey Dresser, MD  calcitRIOL (ROCALTROL) 0.25 MCG capsule Take 1 capsule (0.25 mcg total) by mouth every Monday, Wednesday, and Friday with hemodialysis. 12/30/19   Seawell, Jaimie A, DO  cinacalcet (SENSIPAR) 30 MG tablet Take 1 tablet (30 mg total) by mouth daily with supper. 01/21/20   Posey Boyer, MD  diphenhydramine-acetaminophen (TYLENOL PM) 25-500 MG TABS tablet Take 1 tablet by mouth at bedtime as needed.    [provider]  doxycycline (VIBRA-TABS) 100 MG tablet Take 1 tablet (100 mg total) by mouth 2 (two) times daily. 12/29/19   Seawell, Jaimie A, DO  doxycycline (VIBRAMYCIN) 100 MG capsule Take 100 mg by mouth 2 (two) times daily. 12/31/19   [provider]  ELIQUIS 5 MG TABS tablet TAKE 1 TABLET BY MOUTH TWICE A DAY 10/21/19   Larey Dresser, MD  ferric citrate (AURYXIA) 1 GM 210 MG(Fe) tablet Take 420 mg by mouth 3 (three) times daily with meals.     [provider]  fluticasone (FLONASE) 50 MCG/ACT nasal spray Place 1 spray into both nostrils daily as needed for allergies or rhinitis.    [provider]  gabapentin (NEURONTIN) 100 MG capsule TAKE 1 CAPSULE AT BEDTIME ON HD DAYS (M,W,F) 12/17/19   [name], MD  HYDROcodone-acetaminophen (NORCO/VICODIN) 5-325 MG tablet Take 1 tablet by mouth every 6 (six) hours as needed for moderate pain. 01/01/20   [name], MD  methocarbamol (ROBAXIN) 500 MG tablet Take 1 tablet (500 mg total) by mouth every 8 (eight) hours as needed for muscle spasms. 01/01/20   [name], MD  midodrine (PROAMATINE) 10 MG tablet Take 1 tablet (10 mg total) by mouth every Monday, Wednesday, and Friday with  hemodialysis. 05/08/19   Angiulli, Lavon Paganini, PA-C  multivitamin (RENA-VIT) TABS tablet Take 1 tablet by mouth at bedtime. 05/07/19   Angiulli, Lavon Paganini, PA-C  ondansetron (ZOFRAN-ODT) 8 MG disintegrating tablet Take 1 tablet (8 mg total) by mouth every 8 (eight) hours as needed for nausea or vomiting. 10/20/19   [name], MD  pantoprazole (PROTONIX) 40 MG tablet Take 1 tablet (40 mg total) by mouth daily as needed (acid reflux). 01/21/20   Posey Boyer, MD  promethazine (PHENERGAN) 12.5  MG tablet Take 1 tablet (12.5 mg total) by mouth every 8 (eight) hours as needed for nausea or vomiting. 01/01/20   [name], MD  rosuvastatin (CRESTOR) 10 MG tablet Take 1 tablet (10 mg total) by mouth daily. 09/29/19 12/31/19  Larey Dresser, MD    Allergies    Codeine phosphate  Review of Systems   Review of Systems  All other systems reviewed and are negative. Ten systems reviewed and are negative for acute change, except as noted in the HPI.   Physical Exam Updated Vital Signs BP (!) 114/59 (BP Location: Left Arm)   Pulse 71   Temp 98.2 F (36.8 C) (Oral)   Resp 16   Ht 6' (1.829 m)   Wt 132 kg   SpO2 95%   BMI 39.47 kg/m   Physical Exam Vitals and nursing note reviewed.  Constitutional:      General: He is not in acute distress.    Appearance: Normal appearance. He is not ill-appearing, toxic-appearing or diaphoretic.  HENT:     Head: Normocephalic and atraumatic.     Right Ear: External ear normal.     Left Ear: External ear normal.     Nose: Nose normal.     Mouth/Throat:     Mouth: Mucous membranes are moist.     Pharynx: Oropharynx is clear. No oropharyngeal exudate or posterior oropharyngeal erythema.  Eyes:     Extraocular Movements: Extraocular movements intact.  Cardiovascular:     Rate and Rhythm: Normal rate and regular rhythm.     Pulses: Normal pulses.     Heart sounds: Normal heart sounds. No murmur heard.  No friction rub. No gallop.      Comments:  Palpable pedal pulses noted bilaterally. Pulmonary:     Effort: Pulmonary effort is normal. No respiratory distress.     Breath sounds: Normal breath sounds. No stridor. No wheezing, rhonchi or rales.  Abdominal:     General: Abdomen is flat.     Palpations: Abdomen is soft.     Tenderness: There is no abdominal tenderness.     Comments: Protuberant abdomen.  Abdomen is soft.  Musculoskeletal:        General: Normal range of motion.     Cervical back: Normal range of motion and neck supple. No tenderness.     Right lower leg: Edema present.  Skin:    General: Skin is warm and dry.     Findings: Erythema present.     Comments: Please see images below.  Neurological:     General: No focal deficit present.     Mental Status: He is alert and oriented to person, place, and time.  Psychiatric:        Mood and Affect: Mood normal.        Behavior: Behavior normal.             ED Results / Procedures / Treatments   Labs (all labs ordered are listed, but only abnormal results are displayed) Labs Reviewed  CBC WITH DIFFERENTIAL/PLATELET - Abnormal; Notable for the following components:      Result Value   WBC 15.3 (*)    Hemoglobin 11.7 (*)    HCT 38.7 (*)    RDW 20.9 (*)    Platelets 147 (*)    Neutro Abs 12.9 (*)    Monocytes Absolute 1.2 (*)    Abs Immature Granulocytes 0.08 (*)    All other components within normal limits  BASIC  METABOLIC PANEL - Abnormal; Notable for the following components:   Chloride 92 (*)    Creatinine, Ser 6.56 (*)    GFR calc non Af Amer 8 (*)    GFR calc Af Amer 9 (*)    All other components within normal limits  CULTURE, BLOOD (ROUTINE X 2)  CULTURE, BLOOD (ROUTINE X 2)  SARS CORONAVIRUS 2 BY RT PCR (HOSPITAL ORDER, [facility])   EKG None  Radiology VAS [place] LOWER EXTREMITY VENOUS (DVT) (ONLY [facility] & [facility])  Result Date: 01/21/2020  Lower Venous DVTStudy Indications: Worsening erythema and pain on thigh, erythema  of calf x 1 month.  Other Factors: Cellulitis on antibiotics, Hx GSV harvest. Limitations: Body habitus and patient pain level. Comparison Study: 12/28/19 negative Performing Technologist: June Leap RDMS, RVT  Examination Guidelines: A complete evaluation includes B-mode imaging, spectral Doppler, color Doppler, and power Doppler as needed of all accessible portions of each vessel. Bilateral testing is considered an integral part of a complete examination. Limited examinations for reoccurring indications may be performed as noted. The reflux portion of the exam is performed with the patient in reverse Trendelenburg.  +---------+---------------+---------+-----------+----------+--------------+ RIGHT    CompressibilityPhasicitySpontaneityPropertiesThrombus Aging +---------+---------------+---------+-----------+----------+--------------+ CFV      Full           Yes      Yes                                 +---------+---------------+---------+-----------+----------+--------------+ SFJ      Full                                                        +---------+---------------+---------+-----------+----------+--------------+ FV Prox  Full                                                        +---------+---------------+---------+-----------+----------+--------------+ FV Mid   Full                                                        +---------+---------------+---------+-----------+----------+--------------+ FV DistalFull                                                        +---------+---------------+---------+-----------+----------+--------------+ PFV      Full                                                        +---------+---------------+---------+-----------+----------+--------------+ POP      Full           Yes      Yes                                 +---------+---------------+---------+-----------+----------+--------------+  PTV      Full                                                         +---------+---------------+---------+-----------+----------+--------------+ PERO     Full                                                        +---------+---------------+---------+-----------+----------+--------------+   +----+---------------+---------+-----------+----------+------------------------+ LEFTCompressibilityPhasicitySpontaneityProperties                         +----+---------------+---------+-----------+----------+------------------------+ CFV                                              not imaged due to                                                         patient positioning      +----+---------------+---------+-----------+----------+------------------------+     Summary: RIGHT: - There is no evidence of deep vein thrombosis in the lower extremity.  - No cystic structure found in the popliteal fossa.   *See table(s) above for measurements and observations. Electronically signed by Servando Snare MD on 01/21/2020 at 4:32:00 PM.    Final     Procedures Procedures   Medications Ordered in ED Medications  vancomycin (VANCOCIN) IVPB 1000 mg/200 mL premix (has no administration in time range)  cefTRIAXone (ROCEPHIN) 1 g in sodium chloride 0.9 % 100 mL IVPB (has no administration in time range)  HYDROcodone-acetaminophen (NORCO/VICODIN) 5-325 MG per tablet 1 tablet (has no administration in time range)   ED Course  I have reviewed the triage vital signs and the nursing notes.  Pertinent labs & imaging results that were available during my care of the patient were reviewed by me and considered in my medical decision making (see chart for details).  Clinical Course as of Jan 21 2108  Thu Jan 21, 2020  2029 Comparable to his baseline  Creatinine(!): 6.56 [LJ]  2029 WBC(!): 15.3 [LJ]  2029 NEUT#(!): 12.9 [LJ]  2029 Comparable to his baseline  Platelets(!): 147 [LJ]  2029 Afebrile  Temp: 98.2 F (36.8 C) [LJ]  2029  Not tachycardic  Pulse Rate: 71 [LJ]    Clinical Course User Index [LJ] Rayna Sexton, PA-C   MDM Rules/Calculators/A&P                          Patient is a 69 year old male with a history, physical exam, ED clinical course as noted above.  Patient was recently admitted in early June for cellulitis in the RLE and was found to have an abscess in the right lower extremity as well.  This was drained by IR and patient has subsequently dealt with cellulitis in the right lower extremity since.  He has been taking doxycycline  on an outpatient basis.  His symptoms have been worsening over the last 3 days.  He has significant erythema and edema in the right lower extremity.  Palpable pedal pulses.  Ultrasound was obtained of the right lower extremity which was negative for DVT.  We will discuss with the hospitalist team for likely admission.  Patient was started on Rocephin and vancomycin.  He was given Vicodin for pain.  I discussed likely admission with the patient and he is amenable.  COVID-19 test ordered.  Note: Portions of this report may have been transcribed using voice recognition software. Every effort was made to ensure accuracy; however, inadvertent computerized transcription errors may be present.    Final Clinical Impression(s) / ED Diagnoses Final diagnoses:  Cellulitis of right lower extremity  Failure of outpatient treatment    Rx / DC Orders ED Discharge Orders    None       Rayna Sexton, PA-C 01/21/20 2109    Ward, Delice Bison, DO 01/21/20 2156

## 2020-01-21 NOTE — Progress Notes (Signed)
New Admission Note: ? Arrival Method: via stretcher  Mental Orientation: A/O x 4 Telemetry: n/a Assessment: Completed Skin: Refer to flowsheet IV: Right FA Pain: 0/10 Tubes: Safety Measures: Safety Fall Prevention Plan discussed with patient. Admission: Completed 5 [place] Orientation: Patient has been orientated to the room, unit and the staff.  Orders have been reviewed and are being implemented. Will continue to monitor the patient. Call light has been placed within reach and bed alarm has been activated.  ? [employer], [place]

## 2020-01-21 NOTE — Progress Notes (Signed)
Venous duplex       has been completed. Preliminary results can be found under CV proc through chart review. Raidon Swanner, BS, RDMS, RVT   

## 2020-01-21 NOTE — Patient Instructions (Addendum)
  Keep your leg elevated as possible to reduce swelling  Even though you are on blood thinners, your symptoms are very consistent with having a blood clot in that right leg. This needs to be evaluated on an emergency basis today, so we are sending you to the emergency room.  The vascular labs were unable to schedule this on an outpatient basis because they were too backlog.  The emergency room should be able to get you forced into the schedule to have the scan done today.  Ultimately when you are released from all of this you should be wearing support stockings thigh-high on that right leg.  Continue your other medications as you are taking them now.  I prescribed a refill on your cinacalcet, but you should ask the kidney specialist as to whether this needs to be continued or not as it is a medication I am not very familiar with.  Let us know if we can be of assistance.  Follow-up with Dr. Pamella Pert as needed.   If you have lab work done today you will be contacted with your lab results within the next 2 weeks.  If you have not heard from [place] then please contact us. The fastest way to get your results is to register for My Chart.   IF you received an x-ray today, you will receive an invoice from [facility]. Please contact [facility] Radiology at [phone] with questions or concerns regarding your invoice.   IF you received labwork today, you will receive an invoice from [facility]. Please contact [facility] at [phone] with questions or concerns regarding your invoice.   Our billing staff will not be able to assist you with questions regarding bills from these companies.  You will be contacted with the lab results as soon as they are available. The fastest way to get your results is to activate your My Chart account. Instructions are located on the last page of this paperwork. If you have not heard from [place] regarding the results in 2 weeks, please contact this office.

## 2020-01-21 NOTE — ED Triage Notes (Signed)
Pt reports right leg pain and swelling for the past 3 weeks. Pt is currently on an abx for cellulitis but the symptoms are getting worse and it is spreading up to his thigh. Pt does not have hx of blood clot but is taking Eliquis and baby aspirin daily. Leg is very red, edematous and warm to touch.

## 2020-01-21 NOTE — Progress Notes (Signed)
Pharmacy Antibiotic Note  Alex Williams is a 69 y.o. male admitted on 01/21/2020 with cellulitis.  Pharmacy has been consulted for Zosyn and Vancomycin dosing.   Height: 6' (182.9 cm) Weight: 132 kg (291 lb) IBW/kg (Calculated) : 77.6  Temp (24hrs), Avg:98.4 F (36.9 C), Min:98.2 F (36.8 C), Max:98.5 F (36.9 C)  Recent Labs  Lab 01/21/20 1345  WBC 15.3*  CREATININE 6.56*    Estimated Creatinine Clearance: 14.9 mL/min (A) (by C-G formula based on SCr of 6.56 mg/dL (H)).    Allergies  Allergen Reactions  . Codeine Phosphate Other (See Comments)    Hyperactive     Antimicrobials this admission: 7/1 Zosyn >>  7/1 Vancomycin >>   Dose adjustments this admission: N/a  Microbiology results: Pending   Plan:  - Zosyn 2.25g IV every 8 hours infused over 4 hours  - Vancomycin 2000mg  IV x 1 dose  - Followed by Vancomycin 1000mg  IV qMWF post HD - Monitor patients HD sessions and urine output  - Descalate ABX when appropriate   Thank you for allowing pharmacy to be a part of this patient's care.  Duanne Limerick PharmD. BCPS 01/21/2020 9:22 PM

## 2020-01-21 NOTE — Progress Notes (Signed)
Patient ID: Alex Williams, male    DOB: 03/22/1951  Age: 69 y.o. MRN: [id number]  Chief Complaint  Patient presents with  . Leg Swelling    Pt stated that he has been having some Rt leg swelling for the past 2 days on the upper part of his thigh and the bottom part has been going on for the past month and it is red and swollen and tender to the touch.    Subjective:  69 year old man Current allergies, medications, problem list, past/family and social histories reviewed.  Patient has had heart disease and bypass.  He has had chronic swelling and pain in the right leg.  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  He has been evaluated for clots in his right leg a month ago and everything was negative.  He is on blood thinners.  However despite this over the past few days he has developed more severe pain in his right calf and right thigh.  When he moves his foot upward at all he has severe calf pain.  He cannot hardly stand because of the severe pain in that right leg.  There is erythema up to the knee and also in the medial thigh area.  He has some nausea but no other major GI problems.  He is on dialysis 3 times a week. Objective:  BP (!) 93/59   Pulse 72   Temp 98.5 F (36.9 C) (Temporal)   Ht 6' (1.829 m)   Wt 291 lb (132 kg)   SpO2 91%   BMI 39.47 kg/m   Neck supple without JVD obvious.  Chest clear.  Heart has a systolic murmur in the left upper sternal border primarily.  Abdomen soft without mass or tenderness.  Very tender in the right medial thigh which is mottled erythematous and ecchymotic appearing.  He has stasis dermatitis from just below the knee.  No open sores.  Assessment & Plan:   Assessment: 1. Pain of right lower extremity   2. Gastroesophageal reflux disease without esophagitis   3. Chronic kidney disease (CKD), stage IV (severe) ([place])   4. ESRD on dialysis ([place])   5. DOE (dyspnea on exertion)       Plan: It appears that it will be necessary to transfer him to  the emergency room.  Hopefully they can get a stat Doppler ultrasound of the leg despite him being on blood thinners this certainly points to being a DVT.  He has a great deal difficulty with walking and with pain and decision was made with him and family to transport him by EMS.  No orders of the defined types were placed in this encounter.   No orders of the defined types were placed in this encounter.        Patient Instructions    Keep your leg elevated as possible to reduce swelling  Even though you are on blood thinners, your symptoms are very consistent with having a blood clot in that right leg. This needs to be evaluated on an emergency basis today, so we are sending you to the emergency room.  The vascular labs were unable to schedule this on an outpatient basis because they were too backlog.  The emergency room should be able to get you forced into the schedule to have the scan done today.  Ultimately when you are released from all of this you should be wearing support stockings thigh-high on that right leg.  Continue your other medications as you  are taking them now.  I prescribed a refill on your cinacalcet, but you should ask the kidney specialist as to whether this needs to be continued or not as it is a medication I am not very familiar with.  Let us know if we can be of assistance.  Follow-up with Dr. Pamella Pert as needed.   If you have lab work done today you will be contacted with your lab results within the next 2 weeks.  If you have not heard from [place] then please contact us. The fastest way to get your results is to register for My Chart.   IF you received an x-ray today, you will receive an invoice from [facility]. Please contact [facility] at [phone] with questions or concerns regarding your invoice.   IF you received labwork today, you will receive an invoice from [facility]. Please contact [facility] at [phone] with questions or  concerns regarding your invoice.   Our billing staff will not be able to assist you with questions regarding bills from these companies.  You will be contacted with the lab results as soon as they are available. The fastest way to get your results is to activate your My Chart account. Instructions are located on the last page of this paperwork. If you have not heard from [place] regarding the results in 2 weeks, please contact this office.        Return if symptoms worsen or fail to improve.   Ruben Reason, MD 01/21/2020

## 2020-01-22 ENCOUNTER — Encounter (HOSPITAL_COMMUNITY): Payer: Self-pay | Admitting: Internal Medicine

## 2020-01-22 LAB — COMPREHENSIVE METABOLIC PANEL
ALT: 15 U/L (ref 0–44)
AST: 17 U/L (ref 15–41)
Albumin: 2.7 g/dL — ABNORMAL LOW (ref 3.5–5.0)
Alkaline Phosphatase: 73 U/L (ref 38–126)
Anion gap: 16 — ABNORMAL HIGH (ref 5–15)
BUN: 27 mg/dL — ABNORMAL HIGH (ref 8–23)
CO2: 26 mmol/L (ref 22–32)
Calcium: 10.2 mg/dL (ref 8.9–10.3)
Chloride: 93 mmol/L — ABNORMAL LOW (ref 98–111)
Creatinine, Ser: 7.37 mg/dL — ABNORMAL HIGH (ref 0.61–1.24)
GFR calc Af Amer: 8 mL/min — ABNORMAL LOW (ref >60)
GFR calc non Af Amer: 7 mL/min — ABNORMAL LOW (ref >60)
Glucose, Bld: 99 mg/dL (ref 70–99)
Potassium: 3.8 mmol/L (ref 3.5–5.1)
Sodium: 135 mmol/L (ref 135–145)
Total Bilirubin: 2.1 mg/dL — ABNORMAL HIGH (ref 0.3–1.2)
Total Protein: 5.4 g/dL — ABNORMAL LOW (ref 6.5–8.1)

## 2020-01-22 LAB — CBC
HCT: 36.5 % — ABNORMAL LOW (ref 39.0–52.0)
Hemoglobin: 10.9 g/dL — ABNORMAL LOW (ref 13.0–17.0)
MCH: 27 pg (ref 26.0–34.0)
MCHC: 29.9 g/dL — ABNORMAL LOW (ref 30.0–36.0)
MCV: 90.6 fL (ref 80.0–100.0)
Platelets: 147 10*3/uL — ABNORMAL LOW (ref 150–400)
RBC: 4.03 MIL/uL — ABNORMAL LOW (ref 4.22–5.81)
RDW: 20.4 % — ABNORMAL HIGH (ref 11.5–15.5)
WBC: 12.8 10*3/uL — ABNORMAL HIGH (ref 4.0–10.5)
nRBC: 0 % (ref 0.0–0.2)

## 2020-01-22 LAB — APTT: aPTT: 39 seconds — ABNORMAL HIGH (ref 24–36)

## 2020-01-22 LAB — SARS CORONAVIRUS 2 BY RT PCR (HOSPITAL ORDER, PERFORMED IN ~~LOC~~ HOSPITAL LAB): SARS Coronavirus 2: NEGATIVE

## 2020-01-22 LAB — PROTIME-INR
INR: 2.1 — ABNORMAL HIGH (ref 0.8–1.2)
Prothrombin Time: 22.8 seconds — ABNORMAL HIGH (ref 11.4–15.2)

## 2020-01-22 MED ORDER — NEPRO/CARBSTEADY PO LIQD
237.0000 mL | Freq: Two times a day (BID) | ORAL | Status: DC
  Administered 2020-01-22 – 2020-01-26 (×5): 237 mL via ORAL

## 2020-01-22 MED ORDER — PRO-STAT SUGAR FREE PO LIQD
30.0000 mL | Freq: Two times a day (BID) | ORAL | Status: DC
  Administered 2020-01-22 – 2020-01-28 (×7): 30 mL via ORAL
  Filled 2020-01-22 (×13): qty 30

## 2020-01-22 MED ORDER — GABAPENTIN 100 MG PO CAPS
100.0000 mg | ORAL_CAPSULE | ORAL | Status: DC
  Administered 2020-01-23 – 2020-01-27 (×3): 100 mg via ORAL
  Filled 2020-01-22 (×3): qty 1

## 2020-01-22 MED ORDER — METHOCARBAMOL 500 MG PO TABS
500.0000 mg | ORAL_TABLET | Freq: Three times a day (TID) | ORAL | Status: DC | PRN
  Administered 2020-01-25: 500 mg via ORAL
  Filled 2020-01-22: qty 1

## 2020-01-22 MED ORDER — RENA-VITE PO TABS
1.0000 | ORAL_TABLET | Freq: Every day | ORAL | Status: DC
  Administered 2020-01-23 – 2020-01-27 (×6): 1 via ORAL
  Filled 2020-01-22 (×6): qty 1

## 2020-01-22 MED ORDER — CHLORHEXIDINE GLUCONATE CLOTH 2 % EX PADS
6.0000 | MEDICATED_PAD | Freq: Every day | CUTANEOUS | Status: DC
  Administered 2020-01-22 – 2020-01-25 (×3): 6 via TOPICAL

## 2020-01-22 MED ORDER — FLUTICASONE PROPIONATE 50 MCG/ACT NA SUSP
1.0000 | Freq: Every day | NASAL | Status: DC | PRN
  Filled 2020-01-22: qty 16

## 2020-01-22 MED ORDER — HEPARIN SODIUM (PORCINE) 1000 UNIT/ML IJ SOLN
INTRAMUSCULAR | Status: AC
  Filled 2020-01-22: qty 2

## 2020-01-22 MED ORDER — FERRIC CITRATE 1 GM 210 MG(FE) PO TABS
420.0000 mg | ORAL_TABLET | Freq: Three times a day (TID) | ORAL | Status: DC
  Administered 2020-01-22 – 2020-01-28 (×16): 420 mg via ORAL
  Filled 2020-01-22 (×17): qty 2

## 2020-01-22 NOTE — Consult Note (Signed)
Renal Service Consult Note [facility]  [place] 01/22/2020 [place] Requesting Physician:  Dr Eliseo Squires  Reason for Consult:  ESRD pt w/ RLE cellulitis HPI: The patient is a 69 y.o. year-old w/ hx of atrial fib, ESRD on HD, HTN, HL, gout , obesity , CAD sp CABG sept 2020 admitted for RLE redness/ pain/ swelling and started on IV abx for cellulitis. Asked to see for dialysis.   Pt was here early June for 2 days at which time he had a RLE abscess drained and dc'd on doxy po. ED w/u showed ^WBC , creat 6.5, RLE [place] no DVT.  IV vanc started.   Pt had good HD compliance, no access c/o's.    ROS  denies CP  no joint pain   no HA  no blurry vision  no rash  no diarrhea  no nausea/ vomiting   Past Medical History  Past Medical History:  Diagnosis Date  . Cervical disc disease   . Chronic kidney disease   . COLONIC POLYPS, HX OF 06/27/2007  . EXOGENOUS OBESITY 01/30/2010  . GERD (gastroesophageal reflux disease)    PMH  . GOUT 01/30/2010  . HYPERCHOLESTEROLEMIA 06/30/2007  . HYPERTENSION 06/27/2007  . LOW BACK PAIN 06/27/2007  . NEPHROLITHIASIS, HX OF 06/27/2007  . OSTEOARTHRITIS 06/27/2007  . SLEEP APNEA, OBSTRUCTIVE, MODERATE 01/27/2009  . Wears dentures    upper   Past Surgical History  Past Surgical History:  Procedure Laterality Date  . AV FISTULA PLACEMENT Right 01/19/2019   Procedure: RIGHT BRACHIOCEPHALIC ARTERIOVENOUS (AV) FISTULA CREATION;  Surgeon: Angelia Mould, MD;  Location: [place];  Service: Vascular;  Laterality: Right;  . CARDIAC CATHETERIZATION    . CARPAL TUNNEL RELEASE     left hand  . COLONOSCOPY     with polypectomy  . CORONARY ARTERY BYPASS GRAFT N/A 04/16/2019   Procedure: CORONARY ARTERY BYPASS GRAFTING (CABG)X3  , WITH ENDOSCOPIC HARVESTING OF RIGHT GREATER SAPHENOUS VEIN;  Surgeon: Lajuana Matte, MD;  Location: [place];  Service: Open Heart Surgery;  Laterality: N/A;  . CORONARY/GRAFT ACUTE MI REVASCULARIZATION N/A  04/16/2019   Procedure: CORONARY/GRAFT ACUTE MI REVASCULARIZATION;  Surgeon: Burnell Blanks, MD;  Location: [place] [facility];  Service: Cardiovascular;  Laterality: N/A;  . INSERTION OF DIALYSIS CATHETER N/A 04/29/2019   Procedure: INSERTION OF TUNNELED DIALYSIS CATHETER, right internal jugular;  Surgeon: Rosetta Posner, MD;  Location: [place];  Service: Vascular;  Laterality: N/A;  . IR US GUIDE BX ASP/DRAIN  12/29/2019  . JOINT REPLACEMENT Left 2005   knee  . LUMBAR LAMINECTOMY    . MULTIPLE TOOTH EXTRACTIONS    . PERIPHERAL VASCULAR BALLOON ANGIOPLASTY Right 06/25/2019   Procedure: PERIPHERAL VASCULAR BALLOON ANGIOPLASTY;  Surgeon: Marty Heck, MD;  Location: [place] [facility];  Service: Cardiovascular;  Laterality: Right;  . RADIOFREQUENCY ABLATION KIDNEY    . RIGHT/LEFT HEART CATH AND CORONARY ANGIOGRAPHY N/A 04/16/2019   Procedure: RIGHT/LEFT HEART CATH AND CORONARY ANGIOGRAPHY;  Surgeon: Burnell Blanks, MD;  Location: [place] [facility];  Service: Cardiovascular;  Laterality: N/A;  . TEE WITHOUT CARDIOVERSION N/A 04/16/2019   Procedure: TRANSESOPHAGEAL ECHOCARDIOGRAM (TEE);  Surgeon: Lajuana Matte, MD;  Location: [place];  Service: Open Heart Surgery;  Laterality: N/A;  . TEE WITHOUT CARDIOVERSION N/A 10/08/2019   Procedure: TRANSESOPHAGEAL ECHOCARDIOGRAM (TEE);  Surgeon: Larey Dresser, MD;  Location: [facility];  Service: Cardiovascular;  Laterality: N/A;  . TOTAL KNEE ARTHROPLASTY  left  . VENTRICULAR ASSIST DEVICE INSERTION N/A 04/16/2019   Procedure: VENTRICULAR ASSIST DEVICE INSERTION;  Surgeon: Burnell Blanks, MD;  Location: [place] [facility];  Service: Cardiovascular;  Laterality: N/A;   Family History  Family History  Problem Relation Age of Onset  . Hypertension Other   . Diabetes Sister   . Hyperlipidemia Sister   . Sleep apnea Sister    Social History  reports that he quit smoking about 39 years ago. He has never used smokeless  tobacco. He reports current alcohol use of about 1.0 standard drink of alcohol per week. He reports that he does not use drugs. Allergies  Allergies  Allergen Reactions  . Codeine Phosphate Other (See Comments)    Hyperactive    Home medications Prior to Admission medications   Medication Sig Start Date End Date Taking? Authorizing Provider  allopurinol (ZYLOPRIM) 100 MG tablet Take 1 tablet (100 mg total) by mouth daily. 05/08/19  Yes Angiulli, Lavon Paganini, PA-C  amiodarone (PACERONE) 200 MG tablet Take 200 mg by mouth daily. 12/27/19  Yes [provider]  aspirin EC 81 MG EC tablet Take 1 tablet (81 mg total) by mouth daily. 05/01/19  Yes Conte, Tessa N, PA-C  bisoprolol (ZEBETA) 5 MG tablet TAKE 1 TABLET AS DIRECTED ON NON DIALYSIS TUES,THURS,SAT,SUN NEEDS APPT Patient taking differently: Take 5 mg by mouth as directed. Take 1 tablet as directed on non dialysis (Tues, Thurs, Sat, Sun) needs appt 11/26/19  Yes Larey Dresser, MD  calcitRIOL (ROCALTROL) 0.25 MCG capsule Take 1 capsule (0.25 mcg total) by mouth every Monday, Wednesday, and Friday with hemodialysis. 12/30/19  Yes Seawell, Jaimie A, DO  diphenhydramine-acetaminophen (TYLENOL PM) 25-500 MG TABS tablet Take 1 tablet by mouth at bedtime as needed (sleep).    Yes [provider]  doxycycline (VIBRAMYCIN) 100 MG capsule Take 100 mg by mouth 2 (two) times daily. 12/31/19  Yes [provider]  ELIQUIS 5 MG TABS tablet TAKE 1 TABLET BY MOUTH TWICE A DAY 10/21/19  Yes Larey Dresser, MD  ferric citrate (AURYXIA) 1 GM 210 MG(Fe) tablet Take 420 mg by mouth 3 (three) times daily with meals.    Yes [provider]  fluticasone (FLONASE) 50 MCG/ACT nasal spray Place 1 spray into both nostrils daily as needed for allergies or rhinitis.   Yes [provider]  gabapentin (NEURONTIN) 100 MG capsule TAKE 1 CAPSULE AT BEDTIME ON HD DAYS (M,W,F) 12/17/19  Yes [name], MD  HYDROcodone-acetaminophen  (NORCO/VICODIN) 5-325 MG tablet Take 1 tablet by mouth every 6 (six) hours as needed for moderate pain. 01/01/20  Yes [name], MD  methocarbamol (ROBAXIN) 500 MG tablet Take 1 tablet (500 mg total) by mouth every 8 (eight) hours as needed for muscle spasms. 01/01/20  Yes [name], MD  midodrine (PROAMATINE) 10 MG tablet Take 1 tablet (10 mg total) by mouth every Monday, Wednesday, and Friday with hemodialysis. 05/08/19  Yes Angiulli, Lavon Paganini, PA-C  multivitamin (RENA-VIT) TABS tablet Take 1 tablet by mouth at bedtime. 05/07/19  Yes Angiulli, Lavon Paganini, PA-C  ondansetron (ZOFRAN-ODT) 8 MG disintegrating tablet Take 1 tablet (8 mg total) by mouth every 8 (eight) hours as needed for nausea or vomiting. 10/20/19  Yes [name], MD  promethazine (PHENERGAN) 12.5 MG tablet Take 1 tablet (12.5 mg total) by mouth every 8 (eight) hours as needed for nausea or vomiting. 01/01/20  Yes [name], MD  rosuvastatin ([place])  10 MG tablet Take 10 mg by mouth daily.   Yes [provider]  cinacalcet (SENSIPAR) 30 MG tablet Take 1 tablet (30 mg total) by mouth daily with supper. 01/21/20   Posey Boyer, MD  cinacalcet (SENSIPAR) 90 MG tablet SMARTSIG:1 Tablet(s) By Mouth Every Evening Patient not taking: Reported on 01/21/2020 01/15/20   [provider]  doxycycline (VIBRA-TABS) 100 MG tablet Take 1 tablet (100 mg total) by mouth 2 (two) times daily. Patient not taking: Reported on 01/21/2020 12/29/19   Molli Hazard A, DO  pantoprazole (PROTONIX) 40 MG tablet Take 1 tablet (40 mg total) by mouth daily as needed (acid reflux). 01/21/20   Posey Boyer, MD  rosuvastatin (CRESTOR) 10 MG tablet Take 1 tablet (10 mg total) by mouth daily. 09/29/19 12/31/19  Larey Dresser, MD     Vitals:   01/21/20 2225 01/21/20 2239 01/22/20 0537 01/22/20 0730  BP:  123/75 106/68 (!) 97/34  Pulse:  73 66 65  Resp:  18 18 18   Temp: 98.4 F (36.9 C) 98.7 F (37.1 C) 98.1 F (36.7 C) 98.2 F  (36.8 C)  TempSrc: Oral Oral Oral Oral  SpO2:  97% 92% 91%  Weight:      Height:       Exam Gen alert, up in chair, no distress No rash, cyanosis or gangrene Sclera anicteric, throat clear  No jvd or bruits Chest clear bilat to bases no rales, wheezing or bronchial BS RRR no RG Abd soft ntnd no mass or ascites +bs GU normal male MS no joint effusions or deformity Ext 1+ bilat pretib edema, erythema below the knee on R Neuro is alert, Ox 3 , nf RUA AVF+bruit   Home meds:  - amiodarone 200/ asa 81/ bisoprolol 5mg  nonHD days/ eliquis 5 bid/ midodrine 10mg  pre hd mwf/ crestor 10 qd  - allopurinol 100/ protonix 40 qd     OP HD: MWF [place]     4.5h  450/800   134kg  2/2 bath  P4  AVF  Hep 2000   sensipar 180 tiw   Calcitriol 0.5 tiw   Assessment/ Plan: 1. Cellulitis RLE - getting IV abx per primary team 2. ESRD - usual HD MWF.  HD today, or possibly tomorrow due to staffing issues today 3. HTN/ vol - under dry wt 2kg, keep even on HD. No vol^ on exam 4. CAD h/o CABG 5. Chronic atrial fib - on eliquis and BB      Rob Azucena Dart  MD 01/22/2020, 2:11 PM  Recent Labs  Lab 01/21/20 1345 01/22/20 0525  WBC 15.3* 12.8*  HGB 11.7* 10.9*   Recent Labs  Lab 01/21/20 1345 01/22/20 0525  K 4.0 3.8  BUN 19 27*  CREATININE 6.56* 7.37*  CALCIUM 10.0 10.2

## 2020-01-22 NOTE — Progress Notes (Signed)
Initial Nutrition Assessment  DOCUMENTATION CODES:   Obesity unspecified  INTERVENTION:  Nepro Shake po BID, each supplement provides 425 kcal and 19 grams protein  Rena-vit daily  75ml Pro-stat po BID, each supplement provides 100 kcal and 15 grams protein  NUTRITION DIAGNOSIS:   Increased nutrient needs related to chronic illness (ESRD on HD) as evidenced by estimated needs.    GOAL:   Patient will meet greater than or equal to 90% of their needs    MONITOR:   PO intake, Supplement acceptance, Labs, I & O's, Skin  REASON FOR ASSESSMENT:   Malnutrition Screening Tool    ASSESSMENT:   Pt admitted with cellulitis of RLE. PMH includes ESRD on HD, HTN, HLD, MI s/p CABG x3, Afib, OSA, and gout.   Pt has had a good appetite so far this admission, but typically has a poor appetite. On non-HD days, he has eggs and [place] sausage for breakfast, a deli meat sandwich for lunch (or skips) and soup for dinner. On HD days, pt eats 1/2 of a [employer] biscuit and the egg that comes with it. Pt also has a Nepro shake on HD days. Pt reports being too nauseated after HD to eat.   Pt states he has been taking his home medications and supplements as directed with the exception of Sensipar which he has had difficulty obtaining. Pt taking Rocaltrol and Rena-vit as directed.   Pt reports typically producing urine 3-4x/day.  No UOP documented since admission.  EDW: 130 kg per pt Current wt: 132 kg   PO intake: 100% x 1 recorded meals  Labs reviewed.  Medications: Rocaltrol, Sensipar  NUTRITION - FOCUSED PHYSICAL EXAM:  Unable to perform at this time, will attempt at follow-up.   Diet Order:   Diet Order            Diet Heart Room service appropriate? Yes; Fluid consistency: Thin  Diet effective now                 EDUCATION NEEDS:   No education needs have been identified at this time  Skin:  Skin Assessment: Skin Integrity Issues: Skin Integrity Issues:: Other  (Comment) Other: cellulitis RLE  Last BM:  7/1  Height:   Ht Readings from Last 1 Encounters:  01/21/20 6' (1.829 m)    Weight:   Wt Readings from Last 1 Encounters:  01/21/20 132 kg    BMI:  Body mass index is 39.47 kg/m.  Estimated Nutritional Needs:   Kcal:  2500-2700  Protein:  125-145 grams  Fluid:  1033ml + UOP    Larkin Ina, MS, RD, LDN RD pager number and weekend/on-call pager number located in [place].

## 2020-01-22 NOTE — Plan of Care (Signed)
  Problem: Education: Goal: Knowledge of General Education information will improve Description: Including pain rating scale, medication(s)/side effects and non-pharmacologic comfort measures Outcome: Progressing   Problem: Activity: Goal: Risk for activity intolerance will decrease Outcome: Progressing   Problem: Nutrition: Goal: Adequate nutrition will be maintained Outcome: Progressing   

## 2020-01-22 NOTE — Progress Notes (Signed)
Progress Note    Alex Williams  LKG:[id number] DOB: August 21, 1950  DOA: 01/21/2020 PCP: [name], MD    Brief Narrative:     Medical records reviewed and are as summarized below:  Alex Williams is an 69 y.o. 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 fib on Eliquis, OSA and gout who presents to the emergency department due to worsening right lower extremity swelling and redness.  Patient recently had cellulitis to the right lower extremity since last 3 to 4 months and he was admitted to this facility on 6/7-6/8 due to RLE abscess during which he was treated with IV vancomycin and I&D with done at that time and patient was discharged with doxycycline.  Since discharge, he has had another round of oral doxycycline due to worsening rash on right leg which started with small round red rash on the medial side of the leg, this has since spread to the ankle as well as right thigh.  He was evaluated by his primary care provider earlier today who recommended that due to his symptoms he should go to the ED for further evaluation and management.  To rule out DVT of the RLE.  Patient admitted to nausea and vomiting after dialysis yesterday, but denies fever, chills, chest pain, shortness of breath, abdominal pain.  Assessment/Plan:   Principal Problem:   Cellulitis of right lower extremity Active Problems:   HYPERCHOLESTEROLEMIA   Essential hypertension   Morbid obesity with BMI of 45.0-49.9, adult (HCC)   S/P CABG x 3   Coronary artery disease   ESRD on dialysis (HCC)   Cellulitis   Atrial fibrillation, chronic (HCC)   GERD (gastroesophageal reflux disease)   Cellulitis of right lower extremity Patient has had outpatient failure of RLE cellulitis Continue IV Vanco and Zosyn Continue Norco 5-325 mg every 6 hours as needed for moderate/severe pain -DVT studies negative  Essential hypertension Continue  home medication when med rec is updated  Hypercholesterolemia Continue home Crestor when med rec is updated  ESRD on dialysis on MWF Continue home calcitriol and Sensipar when med rec sedated Nephrology be consulted so that patient can continue with HD while being admitted  Coronary artery disease s/p CABG x3 Continue home aspirin and statin  Atrial fibrillation chronic Continue Eliquis and bisoprolol per home regimen when med rec is updated  GERD Continue Protonix  Morbid obesity Body mass index is 39.47 kg/m.   Family Communication/Anticipated D/C date and plan/Code Status   DVT prophylaxis: eliquis Code Status: Full Code.   Disposition Plan: Status is: Inpatient  Remains inpatient appropriate because:IV treatments appropriate due to intensity of illness or inability to take PO   Dispo: The patient is from: Home              Anticipated d/c is to: Home              Anticipated d/c date is: 2 days              Patient currently is not medically stable to d/c.failed outpatient abx         Medical Consultants:    renal  Subjective:   Still with pain, redness not much better  Objective:    Vitals:   01/21/20 2225 01/21/20 2239 01/22/20 0537 01/22/20 0730  BP:  123/75 106/68 (!) 97/34  Pulse:  73 66 65  Resp:  18 18 18  Temp: 98.4 F (36.9 C) 98.7 F (37.1 C) 98.1 F (36.7 C) 98.2 F (36.8 C)  TempSrc: Oral Oral Oral Oral  SpO2:  97% 92% 91%  Weight:      Height:        Intake/Output Summary (Last 24 hours) at 01/22/2020 1350 Last data filed at 01/22/2020 0900 Gross per 24 hour  Intake 955.97 ml  Output 0 ml  Net 955.97 ml   Filed Weights   01/21/20 1330  Weight: 132 kg    Exam:  General: Appearance:    Obese male in no acute distress     Lungs:     Clear to auscultation bilaterally, respirations unlabored  Heart:    Normal heart rate. Irregularly irregular rhythm. No murmurs, rubs, or gallops.   MS:   Area of erythema on right  leg marked and does not appear to have progressed but also has not re-gressed  Neurologic:   Awake, alert, oriented x 3. No apparent focal neurological           defect.     Data Reviewed:   I have personally reviewed following labs and imaging studies:  Labs: Labs show the following:   Basic Metabolic Panel: Recent Labs  Lab 01/21/20 1345 01/22/20 0525  NA 137 135  K 4.0 3.8  CL 92* 93*  CO2 31 26  GLUCOSE 93 99  BUN 19 27*  CREATININE 6.56* 7.37*  CALCIUM 10.0 10.2   GFR Estimated Creatinine Clearance: 13.3 mL/min (A) (by C-G formula based on SCr of 7.37 mg/dL (H)). Liver Function Tests: Recent Labs  Lab 01/22/20 0525  AST 17  ALT 15  ALKPHOS 73  BILITOT 2.1*  PROT 5.4*  ALBUMIN 2.7*   No results for input(s): LIPASE, AMYLASE in the last 168 hours. No results for input(s): AMMONIA in the last 168 hours. Coagulation profile Recent Labs  Lab 01/22/20 0525  INR 2.1*    CBC: Recent Labs  Lab 01/21/20 1345 01/22/20 0525  WBC 15.3* 12.8*  NEUTROABS 12.9*  --   HGB 11.7* 10.9*  HCT 38.7* 36.5*  MCV 91.7 90.6  PLT 147* 147*   Cardiac Enzymes: No results for input(s): CKTOTAL, CKMB, CKMBINDEX, TROPONINI in the last 168 hours. BNP (last 3 results) No results for input(s): PROBNP in the last 8760 hours. CBG: No results for input(s): GLUCAP in the last 168 hours. D-Dimer: No results for input(s): DDIMER in the last 72 hours. Hgb A1c: No results for input(s): HGBA1C in the last 72 hours. Lipid Profile: No results for input(s): CHOL, HDL, LDLCALC, TRIG, CHOLHDL, LDLDIRECT in the last 72 hours. Thyroid function studies: No results for input(s): TSH, T4TOTAL, T3FREE, THYROIDAB in the last 72 hours.  Invalid input(s): FREET3 Anemia work up: No results for input(s): VITAMINB12, FOLATE, FERRITIN, TIBC, IRON, RETICCTPCT in the last 72 hours. Sepsis Labs: Recent Labs  Lab 01/21/20 1345 01/22/20 0525  WBC 15.3* 12.8*    Microbiology Recent Results  (from the past 240 hour(s))  Blood culture (routine x 2)     Status: None (Preliminary result)   Collection Time: 01/21/20  7:56 PM   Specimen: BLOOD  Result Value Ref Range Status   Specimen Description BLOOD LEFT ARM  Final   Special Requests   Final    BOTTLES DRAWN AEROBIC AND ANAEROBIC Blood Culture adequate volume   Culture   Final    NO GROWTH < 12 HOURS Performed at [facility], [place] [address], [place], [place]  [address]    Report Status PENDING  Incomplete  Blood culture (routine x 2)     Status: None (Preliminary result)   Collection Time: 01/21/20  8:35 PM   Specimen: BLOOD LEFT HAND  Result Value Ref Range Status   Specimen Description BLOOD LEFT HAND  Final   Special Requests   Final    BOTTLES DRAWN AEROBIC AND ANAEROBIC Blood Culture adequate volume   Culture   Final    NO GROWTH < 12 HOURS Performed at [facility], [place] [address], [place], [place] [address]    Report Status PENDING  Incomplete  SARS Coronavirus 2 by RT PCR (hospital order, performed in [facility] hospital lab) Nasopharyngeal Nasopharyngeal Swab     Status: None   Collection Time: 01/21/20  9:13 PM   Specimen: Nasopharyngeal Swab  Result Value Ref Range Status   SARS Coronavirus 2 NEGATIVE NEGATIVE Final    Comment: (NOTE) SARS-CoV-2 target nucleic acids are NOT DETECTED.  The SARS-CoV-2 RNA is generally detectable in upper and lower respiratory specimens during the acute phase of infection. The lowest concentration of SARS-CoV-2 viral copies this assay can detect is 250 copies / mL. A negative result does not preclude SARS-CoV-2 infection and should not be used as the sole basis for treatment or other patient management decisions.  A negative result may occur with improper specimen collection / handling, submission of specimen other than nasopharyngeal swab, presence of viral mutation(s) within the areas targeted by this assay, and inadequate number of viral copies (<250 copies  / mL). A negative result must be combined with clinical observations, patient history, and epidemiological information.  Fact Sheet for Patients:   StrictlyIdeas.no  Fact Sheet for Healthcare Providers: BankingDealers.co.za  This test is not yet approved or  cleared by the [place] FDA and has been authorized for detection and/or diagnosis of SARS-CoV-2 by FDA under an Emergency Use Authorization (EUA).  This EUA will remain in effect (meaning this test can be used) for the duration of the COVID-19 declaration under Section 564(b)(1) of the Act, 21 U.S.C. section 360bbb-3(b)(1), unless the authorization is terminated or revoked sooner.  Performed at [facility], [place] [address], [place], [place] [address]     Procedures and diagnostic studies:  VAS [place] LOWER EXTREMITY VENOUS (DVT) (ONLY [facility] & [facility])  Result Date: 01/21/2020  Lower Venous DVTStudy Indications: Worsening erythema and pain on thigh, erythema of calf x 1 month.  Other Factors: Cellulitis on antibiotics, Hx GSV harvest. Limitations: Body habitus and patient pain level. Comparison Study: 12/28/19 negative Performing Technologist: June Leap RDMS, RVT  Examination Guidelines: A complete evaluation includes B-mode imaging, spectral Doppler, color Doppler, and power Doppler as needed of all accessible portions of each vessel. Bilateral testing is considered an integral part of a complete examination. Limited examinations for reoccurring indications may be performed as noted. The reflux portion of the exam is performed with the patient in reverse Trendelenburg.  +---------+---------------+---------+-----------+----------+--------------+ RIGHT    CompressibilityPhasicitySpontaneityPropertiesThrombus Aging +---------+---------------+---------+-----------+----------+--------------+ CFV      Full           Yes      Yes                                  +---------+---------------+---------+-----------+----------+--------------+ SFJ      Full                                                        +---------+---------------+---------+-----------+----------+--------------+  FV Prox  Full                                                        +---------+---------------+---------+-----------+----------+--------------+ FV Mid   Full                                                        +---------+---------------+---------+-----------+----------+--------------+ FV DistalFull                                                        +---------+---------------+---------+-----------+----------+--------------+ PFV      Full                                                        +---------+---------------+---------+-----------+----------+--------------+ POP      Full           Yes      Yes                                 +---------+---------------+---------+-----------+----------+--------------+ PTV      Full                                                        +---------+---------------+---------+-----------+----------+--------------+ PERO     Full                                                        +---------+---------------+---------+-----------+----------+--------------+   +----+---------------+---------+-----------+----------+------------------------+ LEFTCompressibilityPhasicitySpontaneityProperties                         +----+---------------+---------+-----------+----------+------------------------+ CFV                                              not imaged due to                                                         patient positioning      +----+---------------+---------+-----------+----------+------------------------+     Summary: RIGHT: - There is no evidence of deep vein thrombosis in the lower extremity.  - No cystic structure found in the popliteal fossa.   *See table(s)  above for  measurements and observations. Electronically signed by Servando Snare MD on 01/21/2020 at 4:32:00 PM.    Final     Medications:   . allopurinol  100 mg Oral Daily  . amiodarone  200 mg Oral Daily  . apixaban  5 mg Oral BID  . aspirin EC  81 mg Oral Daily  . [START ON 01/23/2020] bisoprolol  5 mg Oral Q T,Th,S,Su  . calcitRIOL  0.25 mcg Oral Q M,W,F-HD  . Chlorhexidine Gluconate Cloth  6 each Topical Q0600  . cinacalcet  30 mg Oral Q supper  . feeding supplement (NEPRO CARB STEADY)  237 mL Oral BID BM  . feeding supplement (PRO-STAT SUGAR FREE 64)  30 mL Oral BID  . midodrine  10 mg Oral Q M,W,F-HD  . multivitamin  1 tablet Oral QHS  . rosuvastatin  10 mg Oral Daily   Continuous Infusions: . piperacillin-tazobactam (ZOSYN)  IV 2.25 g (01/22/20 0502)  . vancomycin       LOS: 1 day   Geradine Girt  [facility]   How to contact the [facility] Attending or Consulting provider [place] or covering provider during after hours [place], for this patient?  1. Check the care team in [facility] and look for a) attending/consulting [facility] provider listed and b) the [facility] team listed 2. Log into [email or web address] and use [place]'s universal password to access. If you do not have the password, please contact the hospital operator. 3. Locate the [facility] provider you are looking for under [facility] and page to a number that you can be directly reached. 4. If you still have difficulty reaching the provider, please page the [facility] (Director on Call) for the Hospitalists listed on amion for assistance.  01/22/2020, 1:50 PM

## 2020-01-23 ENCOUNTER — Other Ambulatory Visit (HOSPITAL_COMMUNITY): Payer: Self-pay | Admitting: Cardiology

## 2020-01-23 LAB — BASIC METABOLIC PANEL
Anion gap: 13 (ref 5–15)
BUN: 16 mg/dL (ref 8–23)
CO2: 29 mmol/L (ref 22–32)
Calcium: 10.1 mg/dL (ref 8.9–10.3)
Chloride: 97 mmol/L — ABNORMAL LOW (ref 98–111)
Creatinine, Ser: 5.01 mg/dL — ABNORMAL HIGH (ref 0.61–1.24)
GFR calc Af Amer: 13 mL/min — ABNORMAL LOW (ref >60)
GFR calc non Af Amer: 11 mL/min — ABNORMAL LOW (ref >60)
Glucose, Bld: 94 mg/dL (ref 70–99)
Potassium: 4.1 mmol/L (ref 3.5–5.1)
Sodium: 139 mmol/L (ref 135–145)

## 2020-01-23 LAB — CBC
HCT: 35.8 % — ABNORMAL LOW (ref 39.0–52.0)
Hemoglobin: 10.7 g/dL — ABNORMAL LOW (ref 13.0–17.0)
MCH: 27.4 pg (ref 26.0–34.0)
MCHC: 29.9 g/dL — ABNORMAL LOW (ref 30.0–36.0)
MCV: 91.8 fL (ref 80.0–100.0)
Platelets: 153 10*3/uL (ref 150–400)
RBC: 3.9 MIL/uL — ABNORMAL LOW (ref 4.22–5.81)
RDW: 20.2 % — ABNORMAL HIGH (ref 11.5–15.5)
WBC: 7.9 10*3/uL (ref 4.0–10.5)
nRBC: 0 % (ref 0.0–0.2)

## 2020-01-23 NOTE — Plan of Care (Signed)
  Problem: Education: Goal: Knowledge of General Education information will improve Description: Including pain rating scale, medication(s)/side effects and non-pharmacologic comfort measures Outcome: Progressing   Problem: Pain Managment: Goal: General experience of comfort will improve Outcome: Progressing   

## 2020-01-23 NOTE — Progress Notes (Signed)
[facility] Progress Note  Subjective: seen in room, no c/o, R thigh lesions waning, R lower leg still hurt  Vitals:   01/22/20 2354 01/23/20 0031 01/23/20 0644 01/23/20 1006  BP: (!) 88/51 117/73 120/71 110/64  Pulse: 66 68 72 63  Resp: 16 18 18 18   Temp: 98.1 F (36.7 C) 97.9 F (36.6 C) 97.9 F (36.6 C) 97.6 F (36.4 C)  TempSrc: Oral   Oral  SpO2: 94% 96% 99% 94%  Weight:      Height:        Exam: Gen alert, up in chair, No jvd or bruits Chest clear bilat RRR no RG Abd soft ntnd no mass or ascites +bs Ext 1+ bilat pretib edema, erythema below the knee on R Neuro is alert, Ox 3 , nf RUA AVF+bruit   Home meds:  - amiodarone 200/ asa 81/ bisoprolol 5mg  nonHD days/ eliquis 5 bid/ midodrine 10mg  pre hd mwf/ crestor 10 qd  - allopurinol 100/ protonix 40 qd     OP HD: MWF [facility]     4.5h  450/800   134kg  2/2 bath  P4  AVF  Hep 2000   sensipar 180 tiw   Calcitriol 0.5 tiw   Assessment/ Plan: 1. Cellulitis RLE - getting IV abx per primary team 2. ESRD - usual HD MWF.  Got HD Friday.  3. HTN/ vol - under dry wt 2kg, kept even on HD. No vol^ on exam 4. CAD h/o CABG 5. Chronic atrial fib - on eliquis and BB   Alex Williams 01/23/2020, 12:26 PM   Recent Labs  Lab 01/22/20 0525 01/23/20 0352  K 3.8 4.1  BUN 27* 16  CREATININE 7.37* 5.01*  CALCIUM 10.2 10.1  HGB 10.9* 10.7*   Inpatient medications: . allopurinol  100 mg Oral Daily  . amiodarone  200 mg Oral Daily  . apixaban  5 mg Oral BID  . aspirin EC  81 mg Oral Daily  . bisoprolol  5 mg Oral Q T,Th,S,Su  . calcitRIOL  0.25 mcg Oral Q M,W,F-HD  . Chlorhexidine Gluconate Cloth  6 each Topical Q0600  . cinacalcet  30 mg Oral Q supper  . feeding supplement (NEPRO CARB STEADY)  237 mL Oral BID BM  . feeding supplement (PRO-STAT SUGAR FREE 64)  30 mL Oral BID  . ferric citrate  420 mg Oral TID WC  . gabapentin  100 mg Oral Once per day on Mon Wed Fri  . midodrine  10 mg Oral Q M,W,F-HD   . multivitamin  1 tablet Oral QHS  . rosuvastatin  10 mg Oral Daily   . piperacillin-tazobactam (ZOSYN)  IV 2.25 g (01/23/20 0925)  . vancomycin 200 mL/hr at 01/22/20 1700   fluticasone, HYDROcodone-acetaminophen, methocarbamol, pantoprazole

## 2020-01-23 NOTE — Progress Notes (Signed)
Pharmacy Antibiotic Note  Alex Williams is a 69 y.o. male admitted on 01/21/2020 with recurrent RLE cellulitis. Pharmacy has been consulted for Vancomycin + Zosyn dosing.  Of noting the patient was admitted 6/7-6/8 with a RLE abscess s/p drainage by IR with cultures that grew MSSA. The patient was treated with Vancomycin inpatient for 2 days and discharged on a 4.5d course of Doxy po.   This admission, the patient received a 2g LD of Vancomycin on 7/2 and another 1g maintenance dose given pre-HD on 7/2 for a total of 3g. The patient received HD late on 7/2 evening and tolerated 3.5 hr, BFR 450. Kinetics are hard to estimate given obesity and ESRD however the patient's estimated level is likely still within range, estimated at ~15 mcg/ml. The patient is ESRD-MWF and plans to continue on schedule.   Plan: - Continue Vancomycin 1g/HD-MWF - Continue Zosyn 2.25g IV q8h - Consider d/cing Zosyn or narrowing given that Pseudomonas is unlikely - Will continue to follow HD schedule/duration, culture results, LOT, and antibiotic de-escalation plans   Height: 6' (182.9 cm) Weight: 134.5 kg (296 lb 8.3 oz) IBW/kg (Calculated) : 77.6  Temp (24hrs), Avg:97.9 F (36.6 C), Min:97.4 F (36.3 C), Max:98.1 F (36.7 C)  Recent Labs  Lab 01/21/20 1345 01/22/20 0525 01/23/20 0352  WBC 15.3* 12.8* 7.9  CREATININE 6.56* 7.37* 5.01*    Estimated Creatinine Clearance: 19.8 mL/min (A) (by C-G formula based on SCr of 5.01 mg/dL (H)).    Allergies  Allergen Reactions  . Codeine Phosphate Other (See Comments)    Hyperactive     Antimicrobials this admission: Zosyn 7/1 >> Vanc 7/2 >>  Dose adjustments this admission: n/a  Microbiology results: 7/1 BCx >> ng<12h  Thank you for allowing pharmacy to be a part of this patient's care.  Alycia Rossetti, PharmD, BCPS Clinical Pharmacist Clinical phone for 01/23/2020: [phone] 01/23/2020 10:19 AM   **Pharmacist phone directory can now be found on [email or web address]  ([email or web address]).  Listed under [place].

## 2020-01-23 NOTE — Progress Notes (Signed)
Progress Note    Alex Williams  SJG:[id number] DOB: 1950/11/26  DOA: 01/21/2020 PCP: [name], MD    Brief Narrative:     Medical records reviewed and are as summarized below:  Alex Williams is an 69 y.o. 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 fib on Eliquis, OSA and gout who presents to the emergency department due to worsening right lower extremity swelling and redness.  Patient recently had cellulitis to the right lower extremity since last 3 to 4 months and he was admitted to this facility on 6/7-6/8 due to RLE abscess during which he was treated with IV vancomycin and I&D with done at that time and patient was discharged with doxycycline.  Since discharge, he has had another round of oral doxycycline due to worsening rash on right leg which started with small round red rash on the medial side of the leg, this has since spread to the ankle as well as right thigh.  He was evaluated by his primary care provider earlier today who recommended that due to his symptoms he should go to the ED for further evaluation and management.    Assessment/Plan:   Principal Problem:   Cellulitis of right lower extremity Active Problems:   HYPERCHOLESTEROLEMIA   Essential hypertension   Morbid obesity with BMI of 45.0-49.9, adult (HCC)   S/P CABG x 3   Coronary artery disease   ESRD on dialysis (HCC)   Cellulitis   Atrial fibrillation, chronic (HCC)   GERD (gastroesophageal reflux disease)   Cellulitis of right lower extremity Patient has had outpatient failure of RLE cellulitis Continue IV Vanco and Zosyn-- slowly improving on upper thigh but still swollen on lower part -elevate extremity  Continue Norco 5-325 mg every 6 hours as needed for moderate/severe pain -DVT studies negative  Essential hypertension Continue home medication when med rec is updated  Hypercholesterolemia Continue home  Crestor when med rec is updated  ESRD on dialysis on MWF Continue home calcitriol and Sensipar when med rec sedated Nephrology consulted  Coronary artery disease s/p CABG x3 Continue home aspirin and statin  Atrial fibrillation chronic Continue Eliquis and bisoprolol per home regimen when med rec is updated  GERD Continue Protonix  Morbid obesity Body mass index is 40.22 kg/m.   Family Communication/Anticipated D/C date and plan/Code Status   DVT prophylaxis: eliquis Code Status: Full Code.   Disposition Plan: Status is: Inpatient  Remains inpatient appropriate because:IV treatments appropriate due to intensity of illness or inability to take PO   Dispo: The patient is from: Home              Anticipated d/c is to: Home              Anticipated d/c date is: 2 days              Patient currently is not medically stable to d/c.failed outpatient abx         Medical Consultants:    renal  Subjective:   Improved on thigh but lower leg still hurts  Objective:    Vitals:   01/22/20 2354 01/23/20 0031 01/23/20 0644 01/23/20 1006  BP: (!) 88/51 117/73 120/71 110/64  Pulse: 66 68 72 63  Resp: 16 18 18 18   Temp: 98.1 F (36.7 C) 97.9 F (36.6 C) 97.9 F (36.6 C) 97.6 F (36.4 C)  TempSrc: Oral   Oral  SpO2: 94% 96% 99% 94%  Weight:      Height:        Intake/Output Summary (Last 24 hours) at 01/23/2020 1259 Last data filed at 01/23/2020 1250 Gross per 24 hour  Intake 614.06 ml  Output -64 ml  Net 678.06 ml   Filed Weights   01/21/20 1330 01/22/20 1955  Weight: 132 kg 134.5 kg    Exam:  General: Appearance:    Severely obese male in no acute distress     Lungs:     Clear to auscultation bilaterally, respirations unlabored  Heart:    Normal heart rate.  No murmurs, rubs, or gallops.   MS:   Redness up to the knee on right leg  Neurologic:   Awake, alert, oriented x 3. No apparent focal neurological           defect.     Data Reviewed:    I have personally reviewed following labs and imaging studies:  Labs: Labs show the following:   Basic Metabolic Panel: Recent Labs  Lab 01/21/20 1345 01/21/20 1345 01/22/20 0525 01/23/20 0352  NA 137  --  135 139  K 4.0   < > 3.8 4.1  CL 92*  --  93* 97*  CO2 31  --  26 29  GLUCOSE 93  --  99 94  BUN 19  --  27* 16  CREATININE 6.56*  --  7.37* 5.01*  CALCIUM 10.0  --  10.2 10.1   < > = values in this interval not displayed.   GFR Estimated Creatinine Clearance: 19.8 mL/min (A) (by C-G formula based on SCr of 5.01 mg/dL (H)). Liver Function Tests: Recent Labs  Lab 01/22/20 0525  AST 17  ALT 15  ALKPHOS 73  BILITOT 2.1*  PROT 5.4*  ALBUMIN 2.7*   No results for input(s): LIPASE, AMYLASE in the last 168 hours. No results for input(s): AMMONIA in the last 168 hours. Coagulation profile Recent Labs  Lab 01/22/20 0525  INR 2.1*    CBC: Recent Labs  Lab 01/21/20 1345 01/22/20 0525 01/23/20 0352  WBC 15.3* 12.8* 7.9  NEUTROABS 12.9*  --   --   HGB 11.7* 10.9* 10.7*  HCT 38.7* 36.5* 35.8*  MCV 91.7 90.6 91.8  PLT 147* 147* 153   Cardiac Enzymes: No results for input(s): CKTOTAL, CKMB, CKMBINDEX, TROPONINI in the last 168 hours. BNP (last 3 results) No results for input(s): PROBNP in the last 8760 hours. CBG: No results for input(s): GLUCAP in the last 168 hours. D-Dimer: No results for input(s): DDIMER in the last 72 hours. Hgb A1c: No results for input(s): HGBA1C in the last 72 hours. Lipid Profile: No results for input(s): CHOL, HDL, LDLCALC, TRIG, CHOLHDL, LDLDIRECT in the last 72 hours. Thyroid function studies: No results for input(s): TSH, T4TOTAL, T3FREE, THYROIDAB in the last 72 hours.  Invalid input(s): FREET3 Anemia work up: No results for input(s): VITAMINB12, FOLATE, FERRITIN, TIBC, IRON, RETICCTPCT in the last 72 hours. Sepsis Labs: Recent Labs  Lab 01/21/20 1345 01/22/20 0525 01/23/20 0352  WBC 15.3* 12.8* 7.9     Microbiology Recent Results (from the past 240 hour(s))  Blood culture (routine x 2)     Status: None (Preliminary result)   Collection Time: 01/21/20  7:56 PM   Specimen: BLOOD  Result Value Ref Range Status   Specimen Description BLOOD LEFT ARM  Final   Special Requests   Final    BOTTLES DRAWN AEROBIC AND ANAEROBIC Blood  Culture adequate volume   Culture   Final    NO GROWTH < 12 HOURS Performed at [facility], [place] [address] [place], [place] [address]    Report Status PENDING  Incomplete  Blood culture (routine x 2)     Status: None (Preliminary result)   Collection Time: 01/21/20  8:35 PM   Specimen: BLOOD LEFT HAND  Result Value Ref Range Status   Specimen Description BLOOD LEFT HAND  Final   Special Requests   Final    BOTTLES DRAWN AEROBIC AND ANAEROBIC Blood Culture adequate volume   Culture   Final    NO GROWTH < 12 HOURS Performed at [facility], [place] [address], [place], [place] [address]    Report Status PENDING  Incomplete  SARS Coronavirus 2 by RT PCR (hospital order, performed in [facility] hospital lab) Nasopharyngeal Nasopharyngeal Swab     Status: None   Collection Time: 01/21/20  9:13 PM   Specimen: Nasopharyngeal Swab  Result Value Ref Range Status   SARS Coronavirus 2 NEGATIVE NEGATIVE Final    Comment: (NOTE) SARS-CoV-2 target nucleic acids are NOT DETECTED.  The SARS-CoV-2 RNA is generally detectable in upper and lower respiratory specimens during the acute phase of infection. The lowest concentration of SARS-CoV-2 viral copies this assay can detect is 250 copies / mL. A negative result does not preclude SARS-CoV-2 infection and should not be used as the sole basis for treatment or other patient management decisions.  A negative result may occur with improper specimen collection / handling, submission of specimen other than nasopharyngeal swab, presence of viral mutation(s) within the areas targeted by this assay, and inadequate  number of viral copies (<250 copies / mL). A negative result must be combined with clinical observations, patient history, and epidemiological information.  Fact Sheet for Patients:   StrictlyIdeas.no  Fact Sheet for Healthcare Providers: BankingDealers.co.za  This test is not yet approved or  cleared by the [place] FDA and has been authorized for detection and/or diagnosis of SARS-CoV-2 by FDA under an Emergency Use Authorization (EUA).  This EUA will remain in effect (meaning this test can be used) for the duration of the COVID-19 declaration under Section 564(b)(1) of the Act, 21 U.S.C. section 360bbb-3(b)(1), unless the authorization is terminated or revoked sooner.  Performed at [facility], [place] [address] [place], [place] [address]     Procedures and diagnostic studies:  VAS [place] LOWER EXTREMITY VENOUS (DVT) (ONLY [facility] & [facility])  Result Date: 01/21/2020  Lower Venous DVTStudy Indications: Worsening erythema and pain on thigh, erythema of calf x 1 month.  Other Factors: Cellulitis on antibiotics, Hx GSV harvest. Limitations: Body habitus and patient pain level. Comparison Study: 12/28/19 negative Performing Technologist: June Leap RDMS, RVT  Examination Guidelines: A complete evaluation includes B-mode imaging, spectral Doppler, color Doppler, and power Doppler as needed of all accessible portions of each vessel. Bilateral testing is considered an integral part of a complete examination. Limited examinations for reoccurring indications may be performed as noted. The reflux portion of the exam is performed with the patient in reverse Trendelenburg.  +---------+---------------+---------+-----------+----------+--------------+ RIGHT    CompressibilityPhasicitySpontaneityPropertiesThrombus Aging +---------+---------------+---------+-----------+----------+--------------+ CFV      Full           Yes      Yes                                  +---------+---------------+---------+-----------+----------+--------------+  SFJ      Full                                                        +---------+---------------+---------+-----------+----------+--------------+ FV Prox  Full                                                        +---------+---------------+---------+-----------+----------+--------------+ FV Mid   Full                                                        +---------+---------------+---------+-----------+----------+--------------+ FV DistalFull                                                        +---------+---------------+---------+-----------+----------+--------------+ PFV      Full                                                        +---------+---------------+---------+-----------+----------+--------------+ POP      Full           Yes      Yes                                 +---------+---------------+---------+-----------+----------+--------------+ PTV      Full                                                        +---------+---------------+---------+-----------+----------+--------------+ PERO     Full                                                        +---------+---------------+---------+-----------+----------+--------------+   +----+---------------+---------+-----------+----------+------------------------+ LEFTCompressibilityPhasicitySpontaneityProperties                         +----+---------------+---------+-----------+----------+------------------------+ CFV                                              not imaged due to  patient positioning      +----+---------------+---------+-----------+----------+------------------------+     Summary: RIGHT: - There is no evidence of deep vein thrombosis in the lower extremity.  - No cystic structure found in the popliteal fossa.   *See  table(s) above for measurements and observations. Electronically signed by Servando Snare MD on 01/21/2020 at 4:32:00 PM.    Final     Medications:   . allopurinol  100 mg Oral Daily  . amiodarone  200 mg Oral Daily  . apixaban  5 mg Oral BID  . aspirin EC  81 mg Oral Daily  . bisoprolol  5 mg Oral Q T,Th,S,Su  . calcitRIOL  0.25 mcg Oral Q M,W,F-HD  . Chlorhexidine Gluconate Cloth  6 each Topical Q0600  . cinacalcet  30 mg Oral Q supper  . feeding supplement (NEPRO CARB STEADY)  237 mL Oral BID BM  . feeding supplement (PRO-STAT SUGAR FREE 64)  30 mL Oral BID  . ferric citrate  420 mg Oral TID WC  . gabapentin  100 mg Oral Once per day on Mon Wed Fri  . midodrine  10 mg Oral Q M,W,F-HD  . multivitamin  1 tablet Oral QHS  . rosuvastatin  10 mg Oral Daily   Continuous Infusions: . piperacillin-tazobactam (ZOSYN)  IV 2.25 g (01/23/20 1231)  . vancomycin 200 mL/hr at 01/22/20 1700     LOS: 2 days   Geradine Girt  [facility]   How to contact the [facility] Attending or Consulting provider [place] or covering provider during after hours [place], for this patient?  1. Check the care team in [facility] and look for a) attending/consulting TRH provider listed and b) the [facility] team listed 2. Log into [email or web address] and use [place]'s universal password to access. If you do not have the password, please contact the hospital operator. 3. Locate the [facility] provider you are looking for under [facility] and page to a number that you can be directly reached. 4. If you still have difficulty reaching the provider, please page the [facility] (Director on Call) for the Hospitalists listed on amion for assistance.  01/23/2020, 12:59 PM

## 2020-01-24 ENCOUNTER — Encounter (HOSPITAL_COMMUNITY): Payer: Self-pay | Admitting: Internal Medicine

## 2020-01-24 LAB — MRSA PCR SCREENING: MRSA by PCR: NEGATIVE

## 2020-01-24 MED ORDER — ONDANSETRON HCL 4 MG/2ML IJ SOLN
4.0000 mg | Freq: Three times a day (TID) | INTRAMUSCULAR | Status: DC | PRN
  Administered 2020-01-24 – 2020-01-25 (×4): 4 mg via INTRAVENOUS
  Filled 2020-01-24 (×4): qty 2

## 2020-01-24 MED ORDER — CINACALCET HCL 30 MG PO TABS
180.0000 mg | ORAL_TABLET | ORAL | Status: DC
  Administered 2020-01-25: 180 mg via ORAL
  Filled 2020-01-24: qty 6

## 2020-01-24 MED ORDER — CALCITRIOL 0.5 MCG PO CAPS
0.5000 ug | ORAL_CAPSULE | ORAL | Status: DC
  Administered 2020-01-25: 0.5 ug via ORAL

## 2020-01-24 MED ORDER — CHLORHEXIDINE GLUCONATE CLOTH 2 % EX PADS
6.0000 | MEDICATED_PAD | Freq: Every day | CUTANEOUS | Status: DC
  Administered 2020-01-24: 6 via TOPICAL

## 2020-01-24 NOTE — Progress Notes (Signed)
Progress Note    Alex Williams  BLT:[id number] DOB: 10/20/1950  DOA: 01/21/2020 PCP: [name], MD    Brief Narrative:     Medical records reviewed and are as summarized below:  Alex Williams is an 69 y.o. 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 fib on Eliquis, OSA and gout who presents to the emergency department due to worsening right lower extremity swelling and redness.  Patient recently had cellulitis to the right lower extremity since last 3 to 4 months and he was admitted to this facility on 6/7-6/8 due to RLE abscess during which he was treated with IV vancomycin and I&D with done at that time and patient was discharged with doxycycline.  Since discharge, he has had another round of oral doxycycline due to worsening rash on right leg which started with small round red rash on the medial side of the leg, this has since spread to the ankle as well as right thigh.  He was evaluated by his primary care provider earlier today who recommended that due to his symptoms he should go to the ED for further evaluation and management.    Assessment/Plan:   Principal Problem:   Cellulitis of right lower extremity Active Problems:   HYPERCHOLESTEROLEMIA   Essential hypertension   Morbid obesity with BMI of 45.0-49.9, adult (HCC)   S/P CABG x 3   Coronary artery disease   ESRD on dialysis (HCC)   Cellulitis   Atrial fibrillation, chronic (HCC)   GERD (gastroesophageal reflux disease)   Cellulitis of right lower extremity Patient has had outpatient failure of RLE cellulitis Continue IV Vanco and Zosyn-- slowly improving  -elevate extremity  Continue Norco 5-325 mg every 6 hours as needed for moderate/severe pain -DVT studies negative  Essential hypertension Continue home medication when med rec is updated  Hypercholesterolemia Continue home Crestor when med rec is updated  ESRD on  dialysis on MWF Continue home calcitriol and Sensipar when med rec sedated Nephrology consulted  Coronary artery disease s/p CABG x3 Continue home aspirin and statin  Atrial fibrillation chronic Continue Eliquis and bisoprolol per home regimen when med rec is updated  GERD Continue Protonix  Morbid obesity Body mass index is 40.22 kg/m.   Family Communication/Anticipated D/C date and plan/Code Status   DVT prophylaxis: eliquis Code Status: Full Code.   Disposition Plan: Status is: Inpatient  Remains inpatient appropriate because:IV treatments appropriate due to intensity of illness or inability to take PO   Dispo: The patient is from: Home              Anticipated d/c is to: Home              Anticipated d/c date is: 1-2 days              Patient currently is not medically stable to d/c.failed outpatient abx         Medical Consultants:    renal  Subjective:   Leg painful still  Objective:    Vitals:   01/23/20 1704 01/23/20 2057 01/24/20 0526 01/24/20 0907  BP: 115/77 106/63 127/80 125/79  Pulse: 62 63 64 63  Resp: 20 18 18 18   Temp: 97.7 F (36.5 C) 97.8 F (36.6 C) 97.8 F (36.6 C) (!) 97.4 F (36.3 C)  TempSrc: Oral Oral Oral Oral  SpO2: 95% 95% 91% 96%  Weight:  Height:        Intake/Output Summary (Last 24 hours) at 01/24/2020 1351 Last data filed at 01/24/2020 0900 Gross per 24 hour  Intake 600 ml  Output 0 ml  Net 600 ml   Filed Weights   01/21/20 1330 01/22/20 1955  Weight: 132 kg 134.5 kg    Exam:   General: Appearance:    Severely obese male in no acute distress     Lungs:     Clear to auscultation bilaterally, respirations unlabored  Heart:    Normal heart rate. No murmurs, rubs, or gallops.   MS:   All extremities are intact. Redness and swelling improved below knee on right  Neurologic:   Awake, alert, oriented x 3. No apparent focal neurological           defect.    Data Reviewed:   I have personally  reviewed following labs and imaging studies:  Labs: Labs show the following:   Basic Metabolic Panel: Recent Labs  Lab 01/21/20 1345 01/21/20 1345 01/22/20 0525 01/23/20 0352  NA 137  --  135 139  K 4.0   < > 3.8 4.1  CL 92*  --  93* 97*  CO2 31  --  26 29  GLUCOSE 93  --  99 94  BUN 19  --  27* 16  CREATININE 6.56*  --  7.37* 5.01*  CALCIUM 10.0  --  10.2 10.1   < > = values in this interval not displayed.   GFR Estimated Creatinine Clearance: 19.8 mL/min (A) (by C-G formula based on SCr of 5.01 mg/dL (H)). Liver Function Tests: Recent Labs  Lab 01/22/20 0525  AST 17  ALT 15  ALKPHOS 73  BILITOT 2.1*  PROT 5.4*  ALBUMIN 2.7*   No results for input(s): LIPASE, AMYLASE in the last 168 hours. No results for input(s): AMMONIA in the last 168 hours. Coagulation profile Recent Labs  Lab 01/22/20 0525  INR 2.1*    CBC: Recent Labs  Lab 01/21/20 1345 01/22/20 0525 01/23/20 0352  WBC 15.3* 12.8* 7.9  NEUTROABS 12.9*  --   --   HGB 11.7* 10.9* 10.7*  HCT 38.7* 36.5* 35.8*  MCV 91.7 90.6 91.8  PLT 147* 147* 153   Cardiac Enzymes: No results for input(s): CKTOTAL, CKMB, CKMBINDEX, TROPONINI in the last 168 hours. BNP (last 3 results) No results for input(s): PROBNP in the last 8760 hours. CBG: No results for input(s): GLUCAP in the last 168 hours. D-Dimer: No results for input(s): DDIMER in the last 72 hours. Hgb A1c: No results for input(s): HGBA1C in the last 72 hours. Lipid Profile: No results for input(s): CHOL, HDL, LDLCALC, TRIG, CHOLHDL, LDLDIRECT in the last 72 hours. Thyroid function studies: No results for input(s): TSH, T4TOTAL, T3FREE, THYROIDAB in the last 72 hours.  Invalid input(s): FREET3 Anemia work up: No results for input(s): VITAMINB12, FOLATE, FERRITIN, TIBC, IRON, RETICCTPCT in the last 72 hours. Sepsis Labs: Recent Labs  Lab 01/21/20 1345 01/22/20 0525 01/23/20 0352  WBC 15.3* 12.8* 7.9    Microbiology Recent Results  (from the past 240 hour(s))  Blood culture (routine x 2)     Status: None (Preliminary result)   Collection Time: 01/21/20  7:56 PM   Specimen: BLOOD  Result Value Ref Range Status   Specimen Description BLOOD LEFT ARM  Final   Special Requests   Final    BOTTLES DRAWN AEROBIC AND ANAEROBIC Blood Culture adequate volume   Culture   Final  NO GROWTH 3 DAYS Performed at [facility], [place] [address], [place], [place] [address]    Report Status PENDING  Incomplete  Blood culture (routine x 2)     Status: None (Preliminary result)   Collection Time: 01/21/20  8:35 PM   Specimen: BLOOD LEFT HAND  Result Value Ref Range Status   Specimen Description BLOOD LEFT HAND  Final   Special Requests   Final    BOTTLES DRAWN AEROBIC AND ANAEROBIC Blood Culture adequate volume   Culture   Final    NO GROWTH 3 DAYS Performed at [facility], [place] [address], [place], [place] [address]    Report Status PENDING  Incomplete  SARS Coronavirus 2 by RT PCR (hospital order, performed in [facility] hospital lab) Nasopharyngeal Nasopharyngeal Swab     Status: None   Collection Time: 01/21/20  9:13 PM   Specimen: Nasopharyngeal Swab  Result Value Ref Range Status   SARS Coronavirus 2 NEGATIVE NEGATIVE Final    Comment: (NOTE) SARS-CoV-2 target nucleic acids are NOT DETECTED.  The SARS-CoV-2 RNA is generally detectable in upper and lower respiratory specimens during the acute phase of infection. The lowest concentration of SARS-CoV-2 viral copies this assay can detect is 250 copies / mL. A negative result does not preclude SARS-CoV-2 infection and should not be used as the sole basis for treatment or other patient management decisions.  A negative result may occur with improper specimen collection / handling, submission of specimen other than nasopharyngeal swab, presence of viral mutation(s) within the areas targeted by this assay, and inadequate number of viral copies (<250 copies / mL).  A negative result must be combined with clinical observations, patient history, and epidemiological information.  Fact Sheet for Patients:   StrictlyIdeas.no  Fact Sheet for Healthcare Providers: BankingDealers.co.za  This test is not yet approved or  cleared by the [place] FDA and has been authorized for detection and/or diagnosis of SARS-CoV-2 by FDA under an Emergency Use Authorization (EUA).  This EUA will remain in effect (meaning this test can be used) for the duration of the COVID-19 declaration under Section 564(b)(1) of the Act, 21 U.S.C. section 360bbb-3(b)(1), unless the authorization is terminated or revoked sooner.  Performed at [facility], [place] [address] [place], [place] [address]     Procedures and diagnostic studies:  No results found.  Medications:   . allopurinol  100 mg Oral Daily  . amiodarone  200 mg Oral Daily  . apixaban  5 mg Oral BID  . aspirin EC  81 mg Oral Daily  . bisoprolol  5 mg Oral Q T,Th,S,Su  . [START ON 01/25/2020] calcitRIOL  0.5 mcg Oral Q M,W,F-HD  . Chlorhexidine Gluconate Cloth  6 each Topical Q0600  . Chlorhexidine Gluconate Cloth  6 each Topical Q0600  . [START ON 01/25/2020] cinacalcet  180 mg Oral Q M,W,F-HD  . feeding supplement (NEPRO CARB STEADY)  237 mL Oral BID BM  . feeding supplement (PRO-STAT SUGAR FREE 64)  30 mL Oral BID  . ferric citrate  420 mg Oral TID WC  . gabapentin  100 mg Oral Once per day on Mon Wed Fri  . midodrine  10 mg Oral Q M,W,F-HD  . multivitamin  1 tablet Oral QHS  . rosuvastatin  10 mg Oral Daily   Continuous Infusions: . piperacillin-tazobactam (ZOSYN)  IV 2.25 g (01/24/20 0600)  . vancomycin 200 mL/hr at 01/22/20 1700     LOS:  3 days   [place] [facility]   How to contact the [facility] Attending or Consulting provider [place] or covering provider during after hours [place], for this patient?  1. Check the care team in [facility]  and look for a) attending/consulting TRH provider listed and b) the [facility] team listed 2. Log into [email or web address] and use [place]'s universal password to access. If you do not have the password, please contact the hospital operator. 3. Locate the [facility] provider you are looking for under [facility] and page to a number that you can be directly reached. 4. If you still have difficulty reaching the provider, please page the [facility] (Director on Call) for the Hospitalists listed on amion for assistance.  01/24/2020, 1:51 PM

## 2020-01-24 NOTE — Progress Notes (Signed)
[facility] Progress Note   Dialysis Orders: MWF East 4.5h 450/800 134kg 2/2 bath P4 AVF Hep 2000 sensipar 180 tiw Calcitriol 0.5 tiw  Assessment/Plan: 1. Cellulitis-RLE - relapsing - now on IV Vanc and Zosyn - WBC improved. Still painful but better overall. 2. ESRD -MWF - HD tomorrow -  3. Anemia - hgb 10.7 - no ESA at present - follow 4. Secondary hyperparathyroidism - corrected Ca high - use 2 Ca bath - watch trends - increase sensipar to correct dose - on 180 TIW with HD -currently getting 30/day- correct calcitriol to 0.5 q HD 5. HTN/volume - kept even Friday - need to be sure he has standing wts- on midodrine for BP support  6. Nutrition - alb 2.7 - protein suppl/Nepro 7. Chronic afib - on Eliquis and BB  8. CAD - hx CABG   Myriam Jacobson, PA-C [facility] Beeper [phone] 01/24/2020,9:50 AM  LOS: 3 days   Subjective:   LLE still painful to touch and when walking - eating well.  Objective Vitals:   01/23/20 1704 01/23/20 2057 01/24/20 0526 01/24/20 0907  BP: 115/77 106/63 127/80 125/79  Pulse: 62 63 64 63  Resp: 20 18 18 18   Temp: 97.7 F (36.5 C) 97.8 F (36.6 C) 97.8 F (36.6 C) (!) 97.4 F (36.3 C)  TempSrc: Oral Oral Oral Oral  SpO2: 95% 95% 91% 96%  Weight:      Height:       Physical Exam General: NAD obese male sitting on side of bed Heart: RRR Lungs:no rales Abdomen: obese soft Extremities: RLE erythema upper and posterior upper leg tender 1 + edema R > L Dialysis Access: right upper AVF + bruit   Additional Objective Labs: Basic Metabolic Panel: Recent Labs  Lab 01/21/20 1345 01/22/20 0525 01/23/20 0352  NA 137 135 139  K 4.0 3.8 4.1  CL 92* 93* 97*  CO2 31 26 29   GLUCOSE 93 99 94  BUN 19 27* 16  CREATININE 6.56* 7.37* 5.01*  CALCIUM 10.0 10.2 10.1   Liver Function Tests: Recent Labs  Lab 01/22/20 0525  AST 17  ALT 15  ALKPHOS 73  BILITOT 2.1*  PROT 5.4*  ALBUMIN 2.7*   No  results for input(s): LIPASE, AMYLASE in the last 168 hours. CBC: Recent Labs  Lab 01/21/20 1345 01/22/20 0525 01/23/20 0352  WBC 15.3* 12.8* 7.9  NEUTROABS 12.9*  --   --   HGB 11.7* 10.9* 10.7*  HCT 38.7* 36.5* 35.8*  MCV 91.7 90.6 91.8  PLT 147* 147* 153   Blood Culture    Component Value Date/Time   SDES BLOOD LEFT HAND 01/21/2020 2035   SPECREQUEST  01/21/2020 2035    BOTTLES DRAWN AEROBIC AND ANAEROBIC Blood Culture adequate volume   CULT  01/21/2020 2035    NO GROWTH 2 DAYS Performed at [facility], [place] [address] [place], [place] [address]    REPTSTATUS PENDING 01/21/2020 2035    Cardiac Enzymes: No results for input(s): CKTOTAL, CKMB, CKMBINDEX, TROPONINI in the last 168 hours. CBG: No results for input(s): GLUCAP in the last 168 hours. Iron Studies: No results for input(s): IRON, TIBC, TRANSFERRIN, FERRITIN in the last 72 hours. Lab Results  Component Value Date   INR 2.1 (H) 01/22/2020   INR 1.5 (H) 04/16/2019   INR 1.2 04/16/2019   Studies/Results: No results found. Medications: . piperacillin-tazobactam (ZOSYN)  IV 2.25 g (01/24/20 0600)  . vancomycin 200 mL/hr at 01/22/20  1700   . allopurinol  100 mg Oral Daily  . amiodarone  200 mg Oral Daily  . apixaban  5 mg Oral BID  . aspirin EC  81 mg Oral Daily  . bisoprolol  5 mg Oral Q T,Th,S,Su  . calcitRIOL  0.25 mcg Oral Q M,W,F-HD  . Chlorhexidine Gluconate Cloth  6 each Topical Q0600  . cinacalcet  30 mg Oral Q supper  . feeding supplement (NEPRO CARB STEADY)  237 mL Oral BID BM  . feeding supplement (PRO-STAT SUGAR FREE 64)  30 mL Oral BID  . ferric citrate  420 mg Oral TID WC  . gabapentin  100 mg Oral Once per day on Mon Wed Fri  . midodrine  10 mg Oral Q M,W,F-HD  . multivitamin  1 tablet Oral QHS  . rosuvastatin  10 mg Oral Daily

## 2020-01-24 NOTE — Plan of Care (Signed)
  Problem: Education: Goal: Knowledge of General Education information will improve Description: Including pain rating scale, medication(s)/side effects and non-pharmacologic comfort measures Outcome: Progressing   Problem: Coping: Goal: Level of anxiety will decrease Outcome: Progressing   Problem: Pain Managment: Goal: General experience of comfort will improve Outcome: Progressing   

## 2020-01-25 LAB — RENAL FUNCTION PANEL
Albumin: 2.7 g/dL — ABNORMAL LOW (ref 3.5–5.0)
Anion gap: 15 (ref 5–15)
BUN: 37 mg/dL — ABNORMAL HIGH (ref 8–23)
CO2: 24 mmol/L (ref 22–32)
Calcium: 10.9 mg/dL — ABNORMAL HIGH (ref 8.9–10.3)
Chloride: 95 mmol/L — ABNORMAL LOW (ref 98–111)
Creatinine, Ser: 7.8 mg/dL — ABNORMAL HIGH (ref 0.61–1.24)
GFR calc Af Amer: 7 mL/min — ABNORMAL LOW (ref >60)
GFR calc non Af Amer: 6 mL/min — ABNORMAL LOW (ref >60)
Glucose, Bld: 132 mg/dL — ABNORMAL HIGH (ref 70–99)
Phosphorus: 5.4 mg/dL — ABNORMAL HIGH (ref 2.5–4.6)
Potassium: 4.2 mmol/L (ref 3.5–5.1)
Sodium: 134 mmol/L — ABNORMAL LOW (ref 135–145)

## 2020-01-25 LAB — CBC
HCT: 37.3 % — ABNORMAL LOW (ref 39.0–52.0)
Hemoglobin: 11.5 g/dL — ABNORMAL LOW (ref 13.0–17.0)
MCH: 27.6 pg (ref 26.0–34.0)
MCHC: 30.8 g/dL (ref 30.0–36.0)
MCV: 89.7 fL (ref 80.0–100.0)
Platelets: 193 10*3/uL (ref 150–400)
RBC: 4.16 MIL/uL — ABNORMAL LOW (ref 4.22–5.81)
RDW: 19.6 % — ABNORMAL HIGH (ref 11.5–15.5)
WBC: 8 10*3/uL (ref 4.0–10.5)
nRBC: 0 % (ref 0.0–0.2)

## 2020-01-25 LAB — HEPATITIS B SURFACE ANTIGEN: Hepatitis B Surface Ag: NONREACTIVE

## 2020-01-25 MED ORDER — SODIUM CHLORIDE 0.9 % IV SOLN: 100.0000 mL | INTRAVENOUS | Status: DC | PRN

## 2020-01-25 MED ORDER — PENTAFLUOROPROP-TETRAFLUOROETH EX AERO: 1.0000 "application " | INHALATION_SPRAY | CUTANEOUS | Status: DC | PRN

## 2020-01-25 MED ORDER — ALTEPLASE 2 MG IJ SOLR: 2.0000 mg | Freq: Once | INTRAMUSCULAR | Status: DC | PRN

## 2020-01-25 MED ORDER — CALCITRIOL 0.5 MCG PO CAPS
ORAL_CAPSULE | ORAL | Status: AC
  Filled 2020-01-25: qty 1

## 2020-01-25 MED ORDER — HEPARIN SODIUM (PORCINE) 1000 UNIT/ML DIALYSIS
20.0000 [IU]/kg | INTRAMUSCULAR | Status: DC | PRN
  Administered 2020-01-25: 2700 [IU] via INTRAVENOUS_CENTRAL

## 2020-01-25 MED ORDER — SENNA 8.6 MG PO TABS
1.0000 | ORAL_TABLET | Freq: Every day | ORAL | Status: DC | PRN
  Administered 2020-01-25: 8.6 mg via ORAL
  Filled 2020-01-25: qty 1

## 2020-01-25 MED ORDER — HEPARIN SODIUM (PORCINE) 1000 UNIT/ML IJ SOLN
INTRAMUSCULAR | Status: AC
  Filled 2020-01-25: qty 3

## 2020-01-25 MED ORDER — VANCOMYCIN HCL IN DEXTROSE 1-5 GM/200ML-% IV SOLN
INTRAVENOUS | Status: AC
  Administered 2020-01-25: 1000 mg via INTRAVENOUS
  Filled 2020-01-25: qty 200

## 2020-01-25 MED ORDER — MIDODRINE HCL 5 MG PO TABS
ORAL_TABLET | ORAL | Status: AC
  Filled 2020-01-25: qty 2

## 2020-01-25 MED ORDER — LIDOCAINE HCL (PF) 1 % IJ SOLN: 5.0000 mL | INTRAMUSCULAR | Status: DC | PRN

## 2020-01-25 MED ORDER — HEPARIN SODIUM (PORCINE) 1000 UNIT/ML DIALYSIS: 1000.0000 [IU] | INTRAMUSCULAR | Status: DC | PRN

## 2020-01-25 MED ORDER — LIDOCAINE-PRILOCAINE 2.5-2.5 % EX CREA: 1.0000 "application " | TOPICAL_CREAM | CUTANEOUS | Status: DC | PRN

## 2020-01-25 NOTE — Plan of Care (Signed)
  Problem: Education: Goal: Knowledge of General Education information will improve Description: Including pain rating scale, medication(s)/side effects and non-pharmacologic comfort measures Outcome: Progressing   Problem: Activity: Goal: Risk for activity intolerance will decrease Outcome: Progressing   

## 2020-01-25 NOTE — Progress Notes (Addendum)
[facility] Progress Note   Dialysis Orders: MWF East 4.5h 450/800 134kg 2/2 bath P4 AVF Hep 2000 sensipar 180 tiw Calcitriol 0.5 tiw  Assessment/Plan: 1. Cellulitis-RLE - relapsing - now on IV Vanc and Zosyn - WBC improved. Still painful but better overall. 2. ESRD -MWF -labs pending 3. Anemia - hgb 10.7 - no ESA at present - follow 4. Secondary hyperparathyroidism - corrected Ca high - use 2 Ca bath - watch trends - increase sensipar to correct dose - on 180 TIW with HD -currently getting 30/day- correct calcitriol to 0.5 q HD 5. HTN/volume - kept even Friday. pre HD standing weight today 135 - goal set for 3 L - trying to lower edw- need to be sure he has standing wts- on midodrine for BP support , profile 4 - lower temp 6. Nutrition - alb 2.7 - protein suppl/Nepro 7. Chronic afib - on Eliquis and BB  8. CAD - hx CABG  9. Elevated INR - not on coumadin  Only Eliquis  Myriam Jacobson, PA-C [facility] Beeper [phone] 01/25/2020,8:05 AM  LOS: 4 days   Subjective:  HD being initiated Leg still tender  Objective Vitals:   01/24/20 0907 01/24/20 1652 01/24/20 2118 01/25/20 0444  BP: 125/79 127/84 101/68 122/66  Pulse: 63 69 63 63  Resp: 18 18 17    Temp: (!) 97.4 F (36.3 C) 97.9 F (36.6 C) 97.7 F (36.5 C) 97.6 F (36.4 C)  TempSrc: Oral Oral Oral Oral  SpO2: 96% 96% 94% 96%  Weight:   (!) 137.1 kg   Height:       Physical Exam General: NAD supine on HD Heart: RRR Lungs:no rales Abdomen: obese soft Extremities: RLE erythema improving of  upper and posterior upper leg still tender 1 + edema R > L Dialysis Access: right upper AVF Qb 400   Additional Objective Labs: Basic Metabolic Panel: Recent Labs  Lab 01/21/20 1345 01/22/20 0525 01/23/20 0352  NA 137 135 139  K 4.0 3.8 4.1  CL 92* 93* 97*  CO2 31 26 29   GLUCOSE 93 99 94  BUN 19 27* 16  CREATININE 6.56* 7.37* 5.01*  CALCIUM 10.0 10.2 10.1   Liver Function  Tests: Recent Labs  Lab 01/22/20 0525  AST 17  ALT 15  ALKPHOS 73  BILITOT 2.1*  PROT 5.4*  ALBUMIN 2.7*   No results for input(s): LIPASE, AMYLASE in the last 168 hours. CBC: Recent Labs  Lab 01/21/20 1345 01/22/20 0525 01/23/20 0352  WBC 15.3* 12.8* 7.9  NEUTROABS 12.9*  --   --   HGB 11.7* 10.9* 10.7*  HCT 38.7* 36.5* 35.8*  MCV 91.7 90.6 91.8  PLT 147* 147* 153   Blood Culture    Component Value Date/Time   SDES BLOOD LEFT HAND 01/21/2020 2035   SPECREQUEST  01/21/2020 2035    BOTTLES DRAWN AEROBIC AND ANAEROBIC Blood Culture adequate volume   CULT  01/21/2020 2035    NO GROWTH 3 DAYS Performed at [facility], [place] [address], [place], [place] [address]    REPTSTATUS PENDING 01/21/2020 2035    Lab Results  Component Value Date   INR 2.1 (H) 01/22/2020   INR 1.5 (H) 04/16/2019   INR 1.2 04/16/2019   Studies/Results: No results found. Medications: . sodium chloride    . sodium chloride    . piperacillin-tazobactam (ZOSYN)  IV 2.25 g (01/25/20 0513)  . vancomycin 200 mL/hr at 01/22/20 1700   . allopurinol  100 mg Oral Daily  . amiodarone  200 mg Oral Daily  . apixaban  5 mg Oral BID  . aspirin EC  81 mg Oral Daily  . bisoprolol  5 mg Oral Q T,Th,S,Su  . calcitRIOL  0.5 mcg Oral Q M,W,F-HD  . Chlorhexidine Gluconate Cloth  6 each Topical Q0600  . Chlorhexidine Gluconate Cloth  6 each Topical Q0600  . cinacalcet  180 mg Oral Q M,W,F-HD  . feeding supplement (NEPRO CARB STEADY)  237 mL Oral BID BM  . feeding supplement (PRO-STAT SUGAR FREE 64)  30 mL Oral BID  . ferric citrate  420 mg Oral TID WC  . gabapentin  100 mg Oral Once per day on Mon Wed Fri  . midodrine  10 mg Oral Q M,W,F-HD  . multivitamin  1 tablet Oral QHS  . rosuvastatin  10 mg Oral Daily

## 2020-01-25 NOTE — Progress Notes (Signed)
Progress Note    Alex Williams  WIO:[id number] DOB: 04/29/1951  DOA: 01/21/2020 PCP: [name], MD    Brief Narrative:     Medical records reviewed and are as summarized below:  Alex Williams is an 69 y.o. 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 fib on Eliquis, OSA and gout who presents to the emergency department due to worsening right lower extremity swelling and redness.  Patient recently had cellulitis to the right lower extremity since last 3 to 4 months and he was admitted to this facility on 6/7-6/8 due to RLE abscess during which he was treated with IV vancomycin and I&D with done at that time and patient was discharged with doxycycline.  Since discharge, he has had another round of oral doxycycline due to worsening rash on right leg which started with small round red rash on the medial side of the leg, this has since spread to the ankle as well as right thigh.  He was evaluated by his primary care provider earlier today who recommended that due to his symptoms he should go to the ED for further evaluation and management.    Assessment/Plan:   Principal Problem:   Cellulitis of right lower extremity Active Problems:   HYPERCHOLESTEROLEMIA   Essential hypertension   Morbid obesity with BMI of 45.0-49.9, adult (HCC)   S/P CABG x 3   Coronary artery disease   ESRD on dialysis (HCC)   Cellulitis   Atrial fibrillation, chronic (HCC)   GERD (gastroesophageal reflux disease)   Cellulitis of right lower extremity Patient has had outpatient failure of RLE cellulitis Continue IV Vanco and Zosyn-- slowly improving  -elevate extremity  Continue Norco 5-325 mg every 6 hours as needed for moderate/severe pain -DVT studies negative  Essential hypertension Continue home medication when med rec is updated  Hypercholesterolemia Continue home Crestor when med rec is updated  ESRD on  dialysis on MWF Continue home calcitriol and Sensipar when med rec sedated Nephrology consulted  Coronary artery disease s/p CABG x3 Continue home aspirin and statin  Atrial fibrillation chronic Continue Eliquis and bisoprolol per home regimen when med rec is updated  GERD Continue Protonix  Morbid obesity Body mass index is 40.36 kg/m.   Family Communication/Anticipated D/C date and plan/Code Status   DVT prophylaxis: eliquis Code Status: Full Code.   Disposition Plan: Status is: Inpatient  Remains inpatient appropriate because:IV treatments appropriate due to intensity of illness or inability to take PO   Dispo: The patient is from: Home              Anticipated d/c is to: Home              Anticipated d/c date is: 1-2 days              Patient currently is not medically stable to d/c.failed outpatient abx         Medical Consultants:    renal  Subjective:   Just back from HD  Objective:    Vitals:   01/25/20 1200 01/25/20 1230 01/25/20 1235 01/25/20 1320  BP: 103/69 (!) 101/54 (!) 101/54 130/70  Pulse: 68 64 64 65  Resp:      Temp:   98.6 F (37 C)   TempSrc:   Oral   SpO2:   100% 99%  Weight:      Height:  Intake/Output Summary (Last 24 hours) at 01/25/2020 1519 Last data filed at 01/25/2020 1235 Gross per 24 hour  Intake 740 ml  Output 1550 ml  Net -810 ml   Filed Weights   01/22/20 1955 01/24/20 2118 01/25/20 0800  Weight: 134.5 kg (!) 137.1 kg 135 kg    Exam:   General: Appearance:    Obese male in no acute distress     Lungs:     Clear to auscultation bilaterally, respirations unlabored  Heart:    Normal heart rate. .   MS:   Improved redness in right lower leg  Neurologic:   Awake, alert, oriented x 3. No apparent focal neurological           defect.     Data Reviewed:   I have personally reviewed following labs and imaging studies:  Labs: Labs show the following:   Basic Metabolic Panel: Recent Labs  Lab  01/21/20 1345 01/21/20 1345 01/22/20 0525 01/22/20 0525 01/23/20 0352 01/25/20 0619  NA 137  --  135  --  139 134*  K 4.0   < > 3.8   < > 4.1 4.2  CL 92*  --  93*  --  97* 95*  CO2 31  --  26  --  29 24  GLUCOSE 93  --  99  --  94 132*  BUN 19  --  27*  --  16 37*  CREATININE 6.56*  --  7.37*  --  5.01* 7.80*  CALCIUM 10.0  --  10.2  --  10.1 10.9*  PHOS  --   --   --   --   --  5.4*   < > = values in this interval not displayed.   GFR Estimated Creatinine Clearance: 12.7 mL/min (A) (by C-G formula based on SCr of 7.8 mg/dL (H)). Liver Function Tests: Recent Labs  Lab 01/22/20 0525 01/25/20 0619  AST 17  --   ALT 15  --   ALKPHOS 73  --   BILITOT 2.1*  --   PROT 5.4*  --   ALBUMIN 2.7* 2.7*   No results for input(s): LIPASE, AMYLASE in the last 168 hours. No results for input(s): AMMONIA in the last 168 hours. Coagulation profile Recent Labs  Lab 01/22/20 0525  INR 2.1*    CBC: Recent Labs  Lab 01/21/20 1345 01/22/20 0525 01/23/20 0352 01/25/20 0618  WBC 15.3* 12.8* 7.9 8.0  NEUTROABS 12.9*  --   --   --   HGB 11.7* 10.9* 10.7* 11.5*  HCT 38.7* 36.5* 35.8* 37.3*  MCV 91.7 90.6 91.8 89.7  PLT 147* 147* 153 193   Cardiac Enzymes: No results for input(s): CKTOTAL, CKMB, CKMBINDEX, TROPONINI in the last 168 hours. BNP (last 3 results) No results for input(s): PROBNP in the last 8760 hours. CBG: No results for input(s): GLUCAP in the last 168 hours. D-Dimer: No results for input(s): DDIMER in the last 72 hours. Hgb A1c: No results for input(s): HGBA1C in the last 72 hours. Lipid Profile: No results for input(s): CHOL, HDL, LDLCALC, TRIG, CHOLHDL, LDLDIRECT in the last 72 hours. Thyroid function studies: No results for input(s): TSH, T4TOTAL, T3FREE, THYROIDAB in the last 72 hours.  Invalid input(s): FREET3 Anemia work up: No results for input(s): VITAMINB12, FOLATE, FERRITIN, TIBC, IRON, RETICCTPCT in the last 72 hours. Sepsis Labs: Recent Labs    Lab 01/21/20 1345 01/22/20 0525 01/23/20 0352 01/25/20 0618  WBC 15.3* 12.8* 7.9 8.0  Microbiology Recent Results (from the past 240 hour(s))  Blood culture (routine x 2)     Status: None (Preliminary result)   Collection Time: 01/21/20  7:56 PM   Specimen: BLOOD  Result Value Ref Range Status   Specimen Description BLOOD LEFT ARM  Final   Special Requests   Final    BOTTLES DRAWN AEROBIC AND ANAEROBIC Blood Culture adequate volume   Culture   Final    NO GROWTH 4 DAYS Performed at [facility], [address] [place], [place] [address]    Report Status PENDING  Incomplete  Blood culture (routine x 2)     Status: None (Preliminary result)   Collection Time: 01/21/20  8:35 PM   Specimen: BLOOD LEFT HAND  Result Value Ref Range Status   Specimen Description BLOOD LEFT HAND  Final   Special Requests   Final    BOTTLES DRAWN AEROBIC AND ANAEROBIC Blood Culture adequate volume   Culture   Final    NO GROWTH 4 DAYS Performed at [facility], [place] [address] [place], [place] [address]    Report Status PENDING  Incomplete  SARS Coronavirus 2 by RT PCR (hospital order, performed in [facility] hospital lab) Nasopharyngeal Nasopharyngeal Swab     Status: None   Collection Time: 01/21/20  9:13 PM   Specimen: Nasopharyngeal Swab  Result Value Ref Range Status   SARS Coronavirus 2 NEGATIVE NEGATIVE Final    Comment: (NOTE) SARS-CoV-2 target nucleic acids are NOT DETECTED.  The SARS-CoV-2 RNA is generally detectable in upper and lower respiratory specimens during the acute phase of infection. The lowest concentration of SARS-CoV-2 viral copies this assay can detect is 250 copies / mL. A negative result does not preclude SARS-CoV-2 infection and should not be used as the sole basis for treatment or other patient management decisions.  A negative result may occur with improper specimen collection / handling, submission of specimen other than nasopharyngeal swab,  presence of viral mutation(s) within the areas targeted by this assay, and inadequate number of viral copies (<250 copies / mL). A negative result must be combined with clinical observations, patient history, and epidemiological information.  Fact Sheet for Patients:   StrictlyIdeas.no  Fact Sheet for Healthcare Providers: BankingDealers.co.za  This test is not yet approved or  cleared by the [place] FDA and has been authorized for detection and/or diagnosis of SARS-CoV-2 by FDA under an Emergency Use Authorization (EUA).  This EUA will remain in effect (meaning this test can be used) for the duration of the COVID-19 declaration under Section 564(b)(1) of the Act, 21 U.S.C. section 360bbb-3(b)(1), unless the authorization is terminated or revoked sooner.  Performed at [facility], [place] [address], [place], [place] [address]   MRSA PCR Screening     Status: None   Collection Time: 01/24/20  7:22 PM   Specimen: Nasal Mucosa; Nasopharyngeal  Result Value Ref Range Status   MRSA by PCR NEGATIVE NEGATIVE Final    Comment:        The GeneXpert MRSA Assay (FDA approved for NASAL specimens only), is one component of a comprehensive MRSA colonization surveillance program. It is not intended to diagnose MRSA infection nor to guide or monitor treatment for MRSA infections. Performed at [facility], [place] [address], [place], [place] [address]     Procedures and diagnostic studies:  No results found.  Medications:   . allopurinol  100 mg Oral Daily  .  amiodarone  200 mg Oral Daily  . apixaban  5 mg Oral BID  . aspirin EC  81 mg Oral Daily  . bisoprolol  5 mg Oral Q T,Th,S,Su  . calcitRIOL  0.5 mcg Oral Q M,W,F-HD  . Chlorhexidine Gluconate Cloth  6 each Topical Q0600  . Chlorhexidine Gluconate Cloth  6 each Topical Q0600  . cinacalcet  180 mg Oral Q M,W,F-HD  . feeding supplement (NEPRO CARB STEADY)  237 mL  Oral BID BM  . feeding supplement (PRO-STAT SUGAR FREE 64)  30 mL Oral BID  . ferric citrate  420 mg Oral TID WC  . gabapentin  100 mg Oral Once per day on Mon Wed Fri  . midodrine  10 mg Oral Q M,W,F-HD  . multivitamin  1 tablet Oral QHS  . rosuvastatin  10 mg Oral Daily   Continuous Infusions: . piperacillin-tazobactam (ZOSYN)  IV 2.25 g (01/25/20 1331)  . vancomycin Stopped (01/25/20 1407)     LOS: 4 days   Geradine Girt  [facility]   How to contact the [facility] Attending or Consulting provider [place] or covering provider during after hours [place], for this patient?  1. Check the care team in [facility] and look for a) attending/consulting [facility] provider listed and b) the [facility] team listed 2. Log into [email or web address] and use [place]'s universal password to access. If you do not have the password, please contact the hospital operator. 3. Locate the [facility] provider you are looking for under [facility] and page to a number that you can be directly reached. 4. If you still have difficulty reaching the provider, please page the [facility] (Director on Call) for the Hospitalists listed on amion for assistance.  01/25/2020, 3:19 PM

## 2020-01-25 NOTE — Plan of Care (Signed)
  Problem: Activity: Goal: Risk for activity intolerance will decrease Outcome: Progressing   Problem: Nutrition: Goal: Adequate nutrition will be maintained Outcome: Progressing   Problem: Safety: Goal: Ability to remain free from injury will improve Outcome: Progressing   

## 2020-01-26 LAB — CULTURE, BLOOD (ROUTINE X 2)
Culture: NO GROWTH
Culture: NO GROWTH
Special Requests: ADEQUATE
Special Requests: ADEQUATE

## 2020-01-26 MED ORDER — SODIUM CHLORIDE 0.9 % IV SOLN
INTRAVENOUS | Status: DC | PRN
  Administered 2020-01-26 – 2020-01-27 (×2): 250 mL via INTRAVENOUS

## 2020-01-26 NOTE — Progress Notes (Addendum)
[facility] Progress Note   Subjective:  Completed dialysis yesterday 1.3L removed.  Nausea/vomiting after dialysis. Reports BP drops during HD.  Feels better this am, RLE improving.   Objective Vitals:   01/25/20 1705 01/25/20 2057 01/26/20 0630 01/26/20 0920  BP: 128/86 121/65 115/60 (!) 132/95  Pulse: 65 64 73 65  Resp:  17 17 18   Temp: 97.6 F (36.4 C) 98 F (36.7 C) 98.4 F (36.9 C) 97.9 F (36.6 C)  TempSrc: Oral Oral Oral Oral  SpO2: 98% 94% 95% 92%  Weight:  135 kg    Height:        Weight change: -2.1 kg   Physical Exam General: Obese male, nad  Heart: RRR Lungs: Clear bilaterally  Abdomen: soft non-tender Extremities: RLE w mild erythema/swelling, improving per patient.   Dialysis Access: RUE AVF +bruit    Additional Objective Labs: Basic Metabolic Panel: Recent Labs  Lab 01/22/20 0525 01/23/20 0352 01/25/20 0619  NA 135 139 134*  K 3.8 4.1 4.2  CL 93* 97* 95*  CO2 26 29 24   GLUCOSE 99 94 132*  BUN 27* 16 37*  CREATININE 7.37* 5.01* 7.80*  CALCIUM 10.2 10.1 10.9*  PHOS  --   --  5.4*   CBC: Recent Labs  Lab 01/21/20 1345 01/21/20 1345 01/22/20 0525 01/23/20 0352 01/25/20 0618  WBC 15.3*   < > 12.8* 7.9 8.0  NEUTROABS 12.9*  --   --   --   --   HGB 11.7*   < > 10.9* 10.7* 11.5*  HCT 38.7*   < > 36.5* 35.8* 37.3*  MCV 91.7  --  90.6 91.8 89.7  PLT 147*   < > 147* 153 193   < > = values in this interval not displayed.   Blood Culture    Component Value Date/Time   SDES BLOOD LEFT HAND 01/21/2020 2035   SPECREQUEST  01/21/2020 2035    BOTTLES DRAWN AEROBIC AND ANAEROBIC Blood Culture adequate volume   CULT  01/21/2020 2035    NO GROWTH 4 DAYS Performed at [facility], [place] [address], [place], [place] [address]    REPTSTATUS PENDING 01/21/2020 2035    Dialysis Orders: MWF East 4.5h 450/800 134kg 2/2 bath P4 AVF Hep 2000 sensipar 180 tiw Calcitriol 0.5 tiw  Assessment/Plan: 1.  Cellulitis-RLE - on IV Vanc and Zosyn - WBC improved. Blood cultures negative.  Still painful but better overall. Improving.  2. ESRD -HD MWF. Next HD 7/7.  3. Anemia - Hgb 11.5. No ESA needs.  4. Secondary hyperparathyroidism - Corrected Ca high ->use 2 Ca bath. Increased Sensipar to 180 TIW with HD. Hold calcitriol for now.  5. HTN/volume - Did not tolerate UF challenge Monday. Near EDW by weights here.  On midodrine for BP support.  6. Nutrition - protein suppl/Nepro 7. Chronic AFib- on Eliquis and BB  8. CAD - hx CABG  9. Elevated INR - not on coumadin  Only Eliquis  Lynnda Child PA-C [facility] 01/26/2020,9:49 AM   Medications:  piperacillin-tazobactam (ZOSYN)  IV 2.25 g (01/26/20 0748)   vancomycin Stopped (01/25/20 1407)    allopurinol  100 mg Oral Daily   amiodarone  200 mg Oral Daily   apixaban  5 mg Oral BID   aspirin EC  81 mg Oral Daily   bisoprolol  5 mg Oral Q T,Th,S,Su   calcitRIOL  0.5 mcg Oral Q M,W,F-HD   Chlorhexidine Gluconate Cloth  6 each  Topical Q0600   Chlorhexidine Gluconate Cloth  6 each Topical Q0600   cinacalcet  180 mg Oral Q M,W,F-HD   feeding supplement (NEPRO CARB STEADY)  237 mL Oral BID BM   feeding supplement (PRO-STAT SUGAR FREE 64)  30 mL Oral BID   ferric citrate  420 mg Oral TID WC   gabapentin  100 mg Oral Once per day on Mon Wed Fri   midodrine  10 mg Oral Q M,W,F-HD   multivitamin  1 tablet Oral QHS   rosuvastatin  10 mg Oral Daily     Nephrology attending: Patient was seen and examined.  Chart reviewed and I agree with assessment and plan as outlined above. He did not tolerate ultrafiltration yesterday feels slightly weak this morning.  We will minimize UF tomorrow.  Leg cellulitis is better.  On broad-spectrum antibiotics, defer to primary team to manage ABX.  Katheran James, MD [facility].

## 2020-01-26 NOTE — Progress Notes (Signed)
Progress Note    Alex Williams  SWF:[id number] DOB: Nov 03, 1950  DOA: 01/21/2020 PCP: [name], MD    Brief Narrative:     Medical records reviewed and are as summarized below:  Alex Williams is an 69 y.o. 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 fib on Eliquis, OSA and gout who presents to the emergency department due to worsening right lower extremity swelling and redness.  Patient recently had cellulitis to the right lower extremity since last 3 to 4 months and he was admitted to this facility on 6/7-6/8 due to RLE abscess during which he was treated with IV vancomycin and I&D with done at that time and patient was discharged with doxycycline.  Since discharge, he has had another round of oral doxycycline due to worsening rash on right leg which started with small round red rash on the medial side of the leg, this has since spread to the ankle as well as right thigh.  He was evaluated by his primary care provider earlier today who recommended that due to his symptoms he should go to the ED for further evaluation and management.    Assessment/Plan:   Principal Problem:   Cellulitis of right lower extremity Active Problems:   HYPERCHOLESTEROLEMIA   Essential hypertension   Morbid obesity with BMI of 45.0-49.9, adult (HCC)   S/P CABG x 3   Coronary artery disease   ESRD on dialysis (HCC)   Cellulitis   Atrial fibrillation, chronic (HCC)   GERD (gastroesophageal reflux disease)   Cellulitis of right lower extremity Patient has had outpatient failure of RLE cellulitis Continue IV Vanco and Zosyn-- slowly improving -- back of calf is still tight and appears to be cellulitic may need further imaging if continues  -elevate extremity -- encouraged Continue Norco 5-325 mg every 6 hours as needed for moderate/severe pain -DVT studies negative (on eliquis)  Essential hypertension Continue  home medication   Hypercholesterolemia Continue home Crestor   ESRD on dialysis on MWF Continue home calcitriol and Sensipar  Nephrology consulted  Coronary artery disease s/p CABG x3 Continue home aspirin and statin  Atrial fibrillation chronic Continue Eliquis and bisoprolol   GERD Continue Protonix  Morbid obesity Body mass index is 40.36 kg/m.   Family Communication/Anticipated D/C date and plan/Code Status   DVT prophylaxis: eliquis Code Status: Full Code.   Disposition Plan: Status is: Inpatient  Remains inpatient appropriate because:IV treatments appropriate due to intensity of illness or inability to take PO   Dispo: The patient is from: Home              Anticipated d/c is to: Home              Anticipated d/c date is: 1-2 days              Patient currently is not medically stable to d/c.failed outpatient abx         Medical Consultants:    renal  Subjective:   No fever/no chills, some nausea after HD yesterday  Objective:    Vitals:   01/25/20 1705 01/25/20 2057 01/26/20 0630 01/26/20 0920  BP: 128/86 121/65 115/60 (!) 132/95  Pulse: 65 64 73 65  Resp:  17 17 18   Temp: 97.6 F (36.4 C) 98 F (36.7 C) 98.4 F (36.9 C) 97.9 F (36.6 C)  TempSrc: Oral Oral Oral Oral  SpO2: 98% 94% 95%  92%  Weight:  135 kg    Height:        Intake/Output Summary (Last 24 hours) at 01/26/2020 1420 Last data filed at 01/26/2020 0900 Gross per 24 hour  Intake 420 ml  Output 225 ml  Net 195 ml   Filed Weights   01/24/20 2118 01/25/20 0800 01/25/20 2057  Weight: (!) 137.1 kg 135 kg 135 kg    Exam:   General: Appearance:    Obese male in no acute distress     Lungs:     Clear to auscultation bilaterally, respirations unlabored  Heart:    Normal heart rate. .   MS:   Improved redness in right lower leg  Neurologic:   Awake, alert, oriented x 3. No apparent focal neurological           defect.     Data Reviewed:   I have personally  reviewed following labs and imaging studies:  Labs: Labs show the following:   Basic Metabolic Panel: Recent Labs  Lab 01/21/20 1345 01/21/20 1345 01/22/20 0525 01/22/20 0525 01/23/20 0352 01/25/20 0619  NA 137  --  135  --  139 134*  K 4.0   < > 3.8   < > 4.1 4.2  CL 92*  --  93*  --  97* 95*  CO2 31  --  26  --  29 24  GLUCOSE 93  --  99  --  94 132*  BUN 19  --  27*  --  16 37*  CREATININE 6.56*  --  7.37*  --  5.01* 7.80*  CALCIUM 10.0  --  10.2  --  10.1 10.9*  PHOS  --   --   --   --   --  5.4*   < > = values in this interval not displayed.   GFR Estimated Creatinine Clearance: 12.7 mL/min (A) (by C-G formula based on SCr of 7.8 mg/dL (H)). Liver Function Tests: Recent Labs  Lab 01/22/20 0525 01/25/20 0619  AST 17  --   ALT 15  --   ALKPHOS 73  --   BILITOT 2.1*  --   PROT 5.4*  --   ALBUMIN 2.7* 2.7*   No results for input(s): LIPASE, AMYLASE in the last 168 hours. No results for input(s): AMMONIA in the last 168 hours. Coagulation profile Recent Labs  Lab 01/22/20 0525  INR 2.1*    CBC: Recent Labs  Lab 01/21/20 1345 01/22/20 0525 01/23/20 0352 01/25/20 0618  WBC 15.3* 12.8* 7.9 8.0  NEUTROABS 12.9*  --   --   --   HGB 11.7* 10.9* 10.7* 11.5*  HCT 38.7* 36.5* 35.8* 37.3*  MCV 91.7 90.6 91.8 89.7  PLT 147* 147* 153 193   Cardiac Enzymes: No results for input(s): CKTOTAL, CKMB, CKMBINDEX, TROPONINI in the last 168 hours. BNP (last 3 results) No results for input(s): PROBNP in the last 8760 hours. CBG: No results for input(s): GLUCAP in the last 168 hours. D-Dimer: No results for input(s): DDIMER in the last 72 hours. Hgb A1c: No results for input(s): HGBA1C in the last 72 hours. Lipid Profile: No results for input(s): CHOL, HDL, LDLCALC, TRIG, CHOLHDL, LDLDIRECT in the last 72 hours. Thyroid function studies: No results for input(s): TSH, T4TOTAL, T3FREE, THYROIDAB in the last 72 hours.  Invalid input(s): FREET3 Anemia work up: No  results for input(s): VITAMINB12, FOLATE, FERRITIN, TIBC, IRON, RETICCTPCT in the last 72 hours. Sepsis Labs: Recent Labs  Lab 01/21/20  1345 01/22/20 0525 01/23/20 0352 01/25/20 0618  WBC 15.3* 12.8* 7.9 8.0    Microbiology Recent Results (from the past 240 hour(s))  Blood culture (routine x 2)     Status: None   Collection Time: 01/21/20  7:56 PM   Specimen: BLOOD  Result Value Ref Range Status   Specimen Description BLOOD LEFT ARM  Final   Special Requests   Final    BOTTLES DRAWN AEROBIC AND ANAEROBIC Blood Culture adequate volume   Culture   Final    NO GROWTH 5 DAYS Performed at [facility], [address], [place], [place] [address]    Report Status 01/26/2020 FINAL  Final  Blood culture (routine x 2)     Status: None   Collection Time: 01/21/20  8:35 PM   Specimen: BLOOD LEFT HAND  Result Value Ref Range Status   Specimen Description BLOOD LEFT HAND  Final   Special Requests   Final    BOTTLES DRAWN AEROBIC AND ANAEROBIC Blood Culture adequate volume   Culture   Final    NO GROWTH 5 DAYS Performed at [facility], [place] [address], [place], [place] [address]    Report Status 01/26/2020 FINAL  Final  SARS Coronavirus 2 by RT PCR (hospital order, performed in [facility] hospital lab) Nasopharyngeal Nasopharyngeal Swab     Status: None   Collection Time: 01/21/20  9:13 PM   Specimen: Nasopharyngeal Swab  Result Value Ref Range Status   SARS Coronavirus 2 NEGATIVE NEGATIVE Final    Comment: (NOTE) SARS-CoV-2 target nucleic acids are NOT DETECTED.  The SARS-CoV-2 RNA is generally detectable in upper and lower respiratory specimens during the acute phase of infection. The lowest concentration of SARS-CoV-2 viral copies this assay can detect is 250 copies / mL. A negative result does not preclude SARS-CoV-2 infection and should not be used as the sole basis for treatment or other patient management decisions.  A negative result may occur with improper  specimen collection / handling, submission of specimen other than nasopharyngeal swab, presence of viral mutation(s) within the areas targeted by this assay, and inadequate number of viral copies (<250 copies / mL). A negative result must be combined with clinical observations, patient history, and epidemiological information.  Fact Sheet for Patients:   StrictlyIdeas.no  Fact Sheet for Healthcare Providers: BankingDealers.co.za  This test is not yet approved or  cleared by the [place] FDA and has been authorized for detection and/or diagnosis of SARS-CoV-2 by FDA under an Emergency Use Authorization (EUA).  This EUA will remain in effect (meaning this test can be used) for the duration of the COVID-19 declaration under Section 564(b)(1) of the Act, 21 U.S.C. section 360bbb-3(b)(1), unless the authorization is terminated or revoked sooner.  Performed at [facility], [place] [address] [place], [place] [address]   MRSA PCR Screening     Status: None   Collection Time: 01/24/20  7:22 PM   Specimen: Nasal Mucosa; Nasopharyngeal  Result Value Ref Range Status   MRSA by PCR NEGATIVE NEGATIVE Final    Comment:        The GeneXpert MRSA Assay (FDA approved for NASAL specimens only), is one component of a comprehensive MRSA colonization surveillance program. It is not intended to diagnose MRSA infection nor to guide or monitor treatment for MRSA infections. Performed at [facility], [place] [address], [place], [place] [address]     Procedures and diagnostic studies:  No results  found.  Medications:   . allopurinol  100 mg Oral Daily  . amiodarone  200 mg Oral Daily  . apixaban  5 mg Oral BID  . aspirin EC  81 mg Oral Daily  . bisoprolol  5 mg Oral Q T,Th,S,Su  . Chlorhexidine Gluconate Cloth  6 each Topical Q0600  . Chlorhexidine Gluconate Cloth  6 each Topical Q0600  . cinacalcet  180 mg Oral Q M,W,F-HD  .  feeding supplement (NEPRO CARB STEADY)  237 mL Oral BID BM  . feeding supplement (PRO-STAT SUGAR FREE 64)  30 mL Oral BID  . ferric citrate  420 mg Oral TID WC  . gabapentin  100 mg Oral Once per day on Mon Wed Fri  . midodrine  10 mg Oral Q M,W,F-HD  . multivitamin  1 tablet Oral QHS  . rosuvastatin  10 mg Oral Daily   Continuous Infusions: . piperacillin-tazobactam (ZOSYN)  IV 2.25 g (01/26/20 1412)  . vancomycin Stopped (01/25/20 1407)     LOS: 5 days   Geradine Girt  [facility]   How to contact the [facility] Attending or Consulting provider [place] or covering provider during after hours [place], for this patient?  1. Check the care team in [facility] and look for a) attending/consulting TR[facility] provider listed and b) the [facility] team listed 2. Log into [email or web address] and use [place]'s universal password to access. If you do not have the password, please contact the hospital operator. 3. Locate the [facility] provider you are looking for under [facility] and page to a number that you can be directly reached. 4. If you still have difficulty reaching the provider, please page the [facility] (Director on Call) for the Hospitalists listed on amion for assistance.  01/26/2020, 2:20 PM

## 2020-01-26 NOTE — Plan of Care (Signed)
  Problem: Clinical Measurements: Goal: Ability to maintain clinical measurements within normal limits will improve Outcome: Progressing   Problem: Nutrition: Goal: Adequate nutrition will be maintained Outcome: Not Progressing

## 2020-01-27 MED ORDER — PROSOURCE PLUS PO LIQD
30.0000 mL | Freq: Two times a day (BID) | ORAL | Status: DC
  Administered 2020-01-28: 30 mL via ORAL
  Filled 2020-01-27: qty 30

## 2020-01-27 MED ORDER — CEFAZOLIN SODIUM-DEXTROSE 1-4 GM/50ML-% IV SOLN
1.0000 g | INTRAVENOUS | Status: DC
  Administered 2020-01-27 – 2020-01-28 (×2): 1 g via INTRAVENOUS
  Filled 2020-01-27 (×2): qty 50

## 2020-01-27 MED ORDER — BOOST / RESOURCE BREEZE PO LIQD CUSTOM
1.0000 | Freq: Three times a day (TID) | ORAL | Status: DC
  Administered 2020-01-27 – 2020-01-28 (×2): 1 via ORAL

## 2020-01-27 NOTE — Plan of Care (Signed)
  Problem: Activity: Goal: Risk for activity intolerance will decrease Outcome: Progressing   

## 2020-01-27 NOTE — Progress Notes (Signed)
Progress Note    Alex Williams  IOE:[id number] DOB: Aug 21, 1950  DOA: 01/21/2020 PCP: [name], MD    Brief Narrative:  ical records reviewed and are as summarized below:  Alex Williams is an 69 y.o. 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 fib on Eliquis, OSA and gout who presented to the emergency department due to worsening right lower extremity swelling and redness.  Patient recently had cellulitis to the right lower extremity since last 3 to 4 months and he was admitted to this facility on 6/7-6/8 due to RLE abscess during which he was treated with IV vancomycin and I&D with done at that time and patient was discharged with doxycycline.  Since discharge, he has had another round of oral doxycycline due to worsening rash on right leg which started with small round red rash on the medial side of the leg, this has since spread to the ankle as well as right thigh.  He was evaluated by his primary care provider earlier today who recommended that due to his symptoms he should go to the ED for further evaluation and management.    Assessment/Plan:   Recurrent cellulitis of right lower extremity -Failed outpatient antibiotic treatment -Remote history of vein harvesting on his right lower leg for CABG, with mild chronic edema at baseline which is likely his risk factor for recurrent cellulitis -Clinically improving on vancomycin and Zosyn, this is nonpurulent will change to IV Ancef today -If clinically improving and stable transition to oral Keflex at discharge -Follow-up with podiatry to evaluate great toe -Dopplers negative for DVT, also check Hba1c -Discharge planning  Essential hypertension Continue home medication   Hypercholesterolemia Continue home Crestor   ESRD on dialysis on MWF Continue home calcitriol and Sensipar  Nephrology consulted  Coronary artery disease s/p CABG  x3 Continue home aspirin and statin  Atrial fibrillation chronic Continue Eliquis and bisoprolol   GERD Continue Protonix  Morbid obesity Body mass index is 40.36 kg/m.   Family Communication/Anticipated D/C date and plan/Code Status   DVT prophylaxis: eliquis Code Status: Full Code.   Disposition Plan: Status is: Inpatient  Remains inpatient appropriate because:IV treatments appropriate due to intensity of illness or inability to take PO   Dispo: The patient is from: Home              Anticipated d/c is to: Home              Anticipated d/c date is: 7/8 if stable              Patient currently is not medically stable to d/c.failed outpatient abx         Medical Consultants:    renal  Subjective:  -Feels better, still has some pain and swelling but overall improving  Objective:    Vitals:   01/26/20 1650 01/26/20 2029 01/27/20 0506 01/27/20 0931  BP: 139/89 120/79 136/85 136/89  Pulse: 62 (!) 58 (!) 58 63  Resp:  18 17 18   Temp:  98.5 F (36.9 C) (!) 97.5 F (36.4 C) (!) 97.3 F (36.3 C)  TempSrc:  Oral Oral Oral  SpO2: 96% 95% 96% 95%  Weight:      Height:        Intake/Output Summary (Last 24 hours) at 01/27/2020 1457 Last data filed at 01/27/2020 1300 Gross per 24 hour  Intake 1001.35 ml  Output 100 ml  Net 901.35 ml   Filed Weights   01/24/20 2118 01/25/20 0800 01/25/20 2057  Weight: (!) 137.1 kg 135 kg 135 kg    Exam: Gen: Obese male sitting up in bed, awake, Alert, Oriented X 3, no distress HEENT: Neck obese unable to assess JVD Lungs: Poor air movement decreased at the bases CVS: RRR,No Gallops,Rubs or new Murmurs Abd: soft, Non tender, non distended, BS present Extremities: Right lower extremity erythema and swelling, improving Skin: As above   Data Reviewed:   I have personally reviewed following labs and imaging studies:  Labs: Labs show the following:   Basic Metabolic Panel: Recent Labs  Lab 01/21/20 1345  01/21/20 1345 01/22/20 0525 01/22/20 0525 01/23/20 0352 01/25/20 0619  NA 137  --  135  --  139 134*  K 4.0   < > 3.8   < > 4.1 4.2  CL 92*  --  93*  --  97* 95*  CO2 31  --  26  --  29 24  GLUCOSE 93  --  99  --  94 132*  BUN 19  --  27*  --  16 37*  CREATININE 6.56*  --  7.37*  --  5.01* 7.80*  CALCIUM 10.0  --  10.2  --  10.1 10.9*  PHOS  --   --   --   --   --  5.4*   < > = values in this interval not displayed.   GFR Estimated Creatinine Clearance: 12.7 mL/min (A) (by C-G formula based on SCr of 7.8 mg/dL (H)). Liver Function Tests: Recent Labs  Lab 01/22/20 0525 01/25/20 0619  AST 17  --   ALT 15  --   ALKPHOS 73  --   BILITOT 2.1*  --   PROT 5.4*  --   ALBUMIN 2.7* 2.7*   No results for input(s): LIPASE, AMYLASE in the last 168 hours. No results for input(s): AMMONIA in the last 168 hours. Coagulation profile Recent Labs  Lab 01/22/20 0525  INR 2.1*    CBC: Recent Labs  Lab 01/21/20 1345 01/22/20 0525 01/23/20 0352 01/25/20 0618  WBC 15.3* 12.8* 7.9 8.0  NEUTROABS 12.9*  --   --   --   HGB 11.7* 10.9* 10.7* 11.5*  HCT 38.7* 36.5* 35.8* 37.3*  MCV 91.7 90.6 91.8 89.7  PLT 147* 147* 153 193   Cardiac Enzymes: No results for input(s): CKTOTAL, CKMB, CKMBINDEX, TROPONINI in the last 168 hours. BNP (last 3 results) No results for input(s): PROBNP in the last 8760 hours. CBG: No results for input(s): GLUCAP in the last 168 hours. D-Dimer: No results for input(s): DDIMER in the last 72 hours. Hgb A1c: No results for input(s): HGBA1C in the last 72 hours. Lipid Profile: No results for input(s): CHOL, HDL, LDLCALC, TRIG, CHOLHDL, LDLDIRECT in the last 72 hours. Thyroid function studies: No results for input(s): TSH, T4TOTAL, T3FREE, THYROIDAB in the last 72 hours.  Invalid input(s): FREET3 Anemia work up: No results for input(s): VITAMINB12, FOLATE, FERRITIN, TIBC, IRON, RETICCTPCT in the last 72 hours. Sepsis Labs: Recent Labs  Lab  01/21/20 1345 01/22/20 0525 01/23/20 0352 01/25/20 0618  WBC 15.3* 12.8* 7.9 8.0    Microbiology Recent Results (from the past 240 hour(s))  Blood culture (routine x 2)     Status: None   Collection Time: 01/21/20  7:56 PM   Specimen: BLOOD  Result Value Ref Range Status   Specimen Description BLOOD LEFT ARM  Final  Special Requests   Final    BOTTLES DRAWN AEROBIC AND ANAEROBIC Blood Culture adequate volume   Culture   Final    NO GROWTH 5 DAYS Performed at [facility], [place] [address], [employer] [place] [employer], [place] [address]    Report Status 01/26/2020 FINAL  Final  Blood culture (routine x 2)     Status: None   Collection Time: 01/21/20  8:35 PM   Specimen: BLOOD LEFT HAND  Result Value Ref Range Status   Specimen Description BLOOD LEFT HAND  Final   Special Requests   Final    BOTTLES DRAWN AEROBIC AND ANAEROBIC Blood Culture adequate volume   Culture   Final    NO GROWTH 5 DAYS Performed at [facility], [place] [address], [place], [place] [address]    Report Status 01/26/2020 FINAL  Final  SARS Coronavirus 2 by RT PCR (hospital order, performed in [facility] hospital lab) Nasopharyngeal Nasopharyngeal Swab     Status: None   Collection Time: 01/21/20  9:13 PM   Specimen: Nasopharyngeal Swab  Result Value Ref Range Status   SARS Coronavirus 2 NEGATIVE NEGATIVE Final    Comment: (NOTE) SARS-CoV-2 target nucleic acids are NOT DETECTED.  The SARS-CoV-2 RNA is generally detectable in upper and lower respiratory specimens during the acute phase of infection. The lowest concentration of SARS-CoV-2 viral copies this assay can detect is 250 copies / mL. A negative result does not preclude SARS-CoV-2 infection and should not be used as the sole basis for treatment or other patient management decisions.  A negative result may occur with improper specimen collection / handling, submission of specimen other than nasopharyngeal swab, presence of viral mutation(s) within  the areas targeted by this assay, and inadequate number of viral copies (<250 copies / mL). A negative result must be combined with clinical observations, patient history, and epidemiological information.  Fact Sheet for Patients:   StrictlyIdeas.no  Fact Sheet for Healthcare Providers: BankingDealers.co.za  This test is not yet approved or  cleared by the [place] FDA and has been authorized for detection and/or diagnosis of SARS-CoV-2 by FDA under an Emergency Use Authorization (EUA).  This EUA will remain in effect (meaning this test can be used) for the duration of the COVID-19 declaration under Section 564(b)(1) of the Act, 21 U.S.C. section 360bbb-3(b)(1), unless the authorization is terminated or revoked sooner.  Performed at [facility], [place] [address], [place], [place] [address]   MRSA PCR Screening     Status: None   Collection Time: 01/24/20  7:22 PM   Specimen: Nasal Mucosa; Nasopharyngeal  Result Value Ref Range Status   MRSA by PCR NEGATIVE NEGATIVE Final    Comment:        The GeneXpert MRSA Assay (FDA approved for NASAL specimens only), is one component of a comprehensive MRSA colonization surveillance program. It is not intended to diagnose MRSA infection nor to guide or monitor treatment for MRSA infections. Performed at [facility], [place] [address] [place], [place] [address]     Procedures and diagnostic studies:  No results found.  Medications:   . allopurinol  100 mg Oral Daily  . amiodarone  200 mg Oral Daily  . apixaban  5 mg Oral BID  . aspirin EC  81 mg Oral Daily  . bisoprolol  5 mg Oral Q T,Th,S,Su  . Chlorhexidine Gluconate Cloth  6 each Topical Q0600  . Chlorhexidine Gluconate Cloth  6 each Topical  G9030  . cinacalcet  180 mg Oral Q M,W,F-HD  . feeding supplement (NEPRO CARB STEADY)  237 mL Oral BID BM  . feeding supplement (PRO-STAT SUGAR FREE 64)  30 mL Oral BID  .  ferric citrate  420 mg Oral TID WC  . gabapentin  100 mg Oral Once per day on Mon Wed Fri  . midodrine  10 mg Oral Q M,W,F-HD  . multivitamin  1 tablet Oral QHS  . rosuvastatin  10 mg Oral Daily   Continuous Infusions: . sodium chloride Stopped (01/27/20 0622)  .  ceFAZolin (ANCEF) IV 1 g (01/27/20 1210)  . vancomycin Stopped (01/25/20 1407)     LOS: 6 days   Domenic Polite  [facility] 01/27/2020, 2:57 PM

## 2020-01-27 NOTE — Progress Notes (Signed)
Pharmacy Antibiotic Note  Alex Williams is a 69 y.o. male admitted on 01/21/2020 with recurrent RLE cellulitis. Transitioning to Cefazolin today after 6 days of Vancomycin and Zosyn With ESRD  Plan: Cefazolin 1 gram iv Q 24 hours Pharmacy to sign off   Height: 6' (182.9 cm) Weight: 135 kg (297 lb 9.9 oz) IBW/kg (Calculated) : 77.6  Temp (24hrs), Avg:97.8 F (36.6 C), Min:97.3 F (36.3 C), Max:98.5 F (36.9 C)  Recent Labs  Lab 01/21/20 1345 01/22/20 0525 01/23/20 0352 01/25/20 0618 01/25/20 0619  WBC 15.3* 12.8* 7.9 8.0  --   CREATININE 6.56* 7.37* 5.01*  --  7.80*    Estimated Creatinine Clearance: 12.7 mL/min (A) (by C-G formula based on SCr of 7.8 mg/dL (H)).    Allergies  Allergen Reactions  . Codeine Phosphate Other (See Comments)    Hyperactive    Thank you Anette Guarneri, PharmD 01/27/2020 10:34 AM   **Pharmacist phone directory can now be found on [email or web address] ([email or web address]).  Listed under [place].

## 2020-01-27 NOTE — Progress Notes (Signed)
Patient refusing to go to HD. Advised patient it will help with healing of leg wounds but insisted he was too tired to go.  Called HD to let them know.

## 2020-01-27 NOTE — Progress Notes (Addendum)
[facility] Progress Note   Subjective:  Seen in room. Refused dialysis this am. Reports he feels too weak and having diarrhea today.   Objective Vitals:   01/26/20 1650 01/26/20 2029 01/27/20 0506 01/27/20 0931  BP: 139/89 120/79 136/85 136/89  Pulse: 62 (!) 58 (!) 58 63  Resp:  18 17 18   Temp:  98.5 F (36.9 C) (!) 97.5 F (36.4 C) (!) 97.3 F (36.3 C)  TempSrc:  Oral Oral Oral  SpO2: 96% 95% 96% 95%  Weight:      Height:        Weight change:    Physical Exam General: Obese male, nad  Heart: RRR Lungs: Clear bilaterally  Abdomen: soft non-tender Extremities: RLE w mild erythema/swelling, improving per patient.   Dialysis Access: RUE AVF +bruit    Additional Objective Labs: Basic Metabolic Panel: Recent Labs  Lab 01/22/20 0525 01/23/20 0352 01/25/20 0619  NA 135 139 134*  K 3.8 4.1 4.2  CL 93* 97* 95*  CO2 26 29 24   GLUCOSE 99 94 132*  BUN 27* 16 37*  CREATININE 7.37* 5.01* 7.80*  CALCIUM 10.2 10.1 10.9*  PHOS  --   --  5.4*   CBC: Recent Labs  Lab 01/21/20 1345 01/21/20 1345 01/22/20 0525 01/23/20 0352 01/25/20 0618  WBC 15.3*   < > 12.8* 7.9 8.0  NEUTROABS 12.9*  --   --   --   --   HGB 11.7*   < > 10.9* 10.7* 11.5*  HCT 38.7*   < > 36.5* 35.8* 37.3*  MCV 91.7  --  90.6 91.8 89.7  PLT 147*   < > 147* 153 193   < > = values in this interval not displayed.   Blood Culture    Component Value Date/Time   SDES BLOOD LEFT HAND 01/21/2020 2035   SPECREQUEST  01/21/2020 2035    BOTTLES DRAWN AEROBIC AND ANAEROBIC Blood Culture adequate volume   CULT  01/21/2020 2035    NO GROWTH 5 DAYS Performed at [facility], [place] [address], [place], [place] [address]    REPTSTATUS 01/26/2020 FINAL 01/21/2020 2035    Dialysis Orders: MWF East 4.5h 450/800 134kg 2/2 bath P4 AVF Hep 2000 sensipar 180 tiw Calcitriol 0.5 tiw  Assessment/Plan: 1. Cellulitis-RLE - on IV Vanc and Ancef - WBC improved. Blood cultures  negative.  Still painful but better overall. Improving.  2. ESRD -HD MWF. Next HD 7/7.  Refused to go to dialysis this. Counseled about missing tmts -will attempt to bring him for 2nd shift or tomorrow off schedule.  3. Anemia - Hgb 11.5. No ESA needs.  4. Secondary hyperparathyroidism - Corrected Ca high ->use 2 Ca bath. Increased Sensipar to 180 TIW with HD. Hold calcitriol for now.  5. HTN/volume - Did not tolerate UF challenge Monday. Near EDW by weights here.  On midodrine for BP support.  6. Nutrition - protein suppl/Nepro 7. Chronic AFib- on Eliquis and BB  8. CAD - hx CABG  9. Elevated INR - not on coumadin  Only Eliquis  Lynnda Child PA-C [facility] 01/26/2020,9:49 AM   Medications: . sodium chloride Stopped (01/27/20 0622)  . piperacillin-tazobactam (ZOSYN)  IV Stopped (01/27/20 0622)  . vancomycin Stopped (01/25/20 1407)   . allopurinol  100 mg Oral Daily  . amiodarone  200 mg Oral Daily  . apixaban  5 mg Oral BID  . aspirin EC  81 mg Oral Daily  . bisoprolol  5 mg Oral Q T,Th,S,Su  . Chlorhexidine Gluconate Cloth  6 each Topical Q0600  . Chlorhexidine Gluconate Cloth  6 each Topical Q0600  . cinacalcet  180 mg Oral Q M,W,F-HD  . feeding supplement (NEPRO CARB STEADY)  237 mL Oral BID BM  . feeding supplement (PRO-STAT SUGAR FREE 64)  30 mL Oral BID  . ferric citrate  420 mg Oral TID WC  . gabapentin  100 mg Oral Once per day on Mon Wed Fri  . midodrine  10 mg Oral Q M,W,F-HD  . multivitamin  1 tablet Oral QHS  . rosuvastatin  10 mg Oral Daily      Nephrology attending: Patient was seen and examined bedside.  Chart reviewed.  I agree with assessment plan as outlined above. Complaining of diarrhea last night.  Plan for dialysis today.  Still on broad-spectrum antibiotics including Ancef and vancomycin.  Cellulitis is improving.  Katheran James, MD [facility].

## 2020-01-27 NOTE — Plan of Care (Signed)
  Problem: Pain Managment: Goal: General experience of comfort will improve Outcome: Progressing   

## 2020-01-27 NOTE — Progress Notes (Addendum)
Nutrition Follow-up  DOCUMENTATION CODES:   Obesity unspecified  INTERVENTION:  D/c Nepro  Boost Breeze po TID, each supplement provides 250 kcal and 9 grams of protein  Magic cup TID with meals, each supplement provides 290 kcal and 9 grams of protein  70ml Pro-source Plus po BID, each supplement provides 100 kcal and 15 grams protein  Continue Rena-vit daily   NUTRITION DIAGNOSIS:   Increased nutrient needs related to chronic illness (ESRD on HD) as evidenced by estimated needs.  Ongoing  GOAL:   Patient will meet greater than or equal to 90% of their needs  Progressing  MONITOR:   PO intake, Supplement acceptance, Labs, I & O's, Skin  REASON FOR ASSESSMENT:   Malnutrition Screening Tool    ASSESSMENT:   Pt admitted with cellulitis of RLE. PMH includes ESRD on HD, HTN, HLD, MI s/p CABG x3, Afib, OSA, and gout.  Pt states his appetite is still poor. Pt does not like Nepro, but is open to trying [employer]. Will also order Magic Cup to provide more protein/calories. Pt is taking the 1st dose of Pro-stat, but feels the later dose is making him feel nauseated. Pt agreeable to trying to take the 2nd dose at an earlier time.   PO Intake: 0-100% x last 8 recorded meals (50% average intake)  EDW 134 kg (per Nephrology note) Current wt: 135 kg Last HD 7/5, net UF 1360ml  Labs: Na 134 (L), Phosphorus 5.4 (H) Medications: Sensipar, Nepro BID, 69ml Pro-stat BID, Auryxia, Rena-vit  NUTRITION - FOCUSED PHYSICAL EXAM:    Most Recent Value  Orbital Region No depletion  Upper Arm Region No depletion  Thoracic and Lumbar Region No depletion  Buccal Region No depletion  Temple Region No depletion  Clavicle Bone Region No depletion  Clavicle and Acromion Bone Region No depletion  Scapular Bone Region No depletion  Dorsal Hand Mild depletion  Patellar Region No depletion  Anterior Thigh Region No depletion  Posterior Calf Region No depletion  Edema (RD Assessment)  Moderate  Hair Reviewed  Eyes Reviewed  Mouth Reviewed  Skin Reviewed  Nails Reviewed       Diet Order:   Diet Order            Diet Heart Room service appropriate? Yes; Fluid consistency: Thin  Diet effective now                 EDUCATION NEEDS:   No education needs have been identified at this time  Skin:  Skin Assessment: Skin Integrity Issues: Skin Integrity Issues:: Incisions Incisions: R leg Other: cellulitis RLE  Last BM:  7/6  Height:   Ht Readings from Last 1 Encounters:  01/21/20 6' (1.829 m)    Weight:   Wt Readings from Last 1 Encounters:  01/25/20 135 kg    BMI:  Body mass index is 40.36 kg/m.  Estimated Nutritional Needs:   Kcal:  2500-2700  Protein:  125-145 grams  Fluid:  1039ml + UOP    Larkin Ina, MS, RD, LDN RD pager number and weekend/on-call pager number located in [place].

## 2020-01-28 LAB — CBC
HCT: 39.5 % (ref 39.0–52.0)
Hemoglobin: 12 g/dL — ABNORMAL LOW (ref 13.0–17.0)
MCH: 27 pg (ref 26.0–34.0)
MCHC: 30.4 g/dL (ref 30.0–36.0)
MCV: 89 fL (ref 80.0–100.0)
Platelets: 217 10*3/uL (ref 150–400)
RBC: 4.44 MIL/uL (ref 4.22–5.81)
RDW: 19.9 % — ABNORMAL HIGH (ref 11.5–15.5)
WBC: 9.7 10*3/uL (ref 4.0–10.5)
nRBC: 0 % (ref 0.0–0.2)

## 2020-01-28 LAB — BASIC METABOLIC PANEL
Anion gap: 14 (ref 5–15)
BUN: 35 mg/dL — ABNORMAL HIGH (ref 8–23)
CO2: 29 mmol/L (ref 22–32)
Calcium: 11.6 mg/dL — ABNORMAL HIGH (ref 8.9–10.3)
Chloride: 96 mmol/L — ABNORMAL LOW (ref 98–111)
Creatinine, Ser: 8.28 mg/dL — ABNORMAL HIGH (ref 0.61–1.24)
GFR calc Af Amer: 7 mL/min — ABNORMAL LOW (ref >60)
GFR calc non Af Amer: 6 mL/min — ABNORMAL LOW (ref >60)
Glucose, Bld: 100 mg/dL — ABNORMAL HIGH (ref 70–99)
Potassium: 4.7 mmol/L (ref 3.5–5.1)
Sodium: 139 mmol/L (ref 135–145)

## 2020-01-28 MED ORDER — HEPARIN SODIUM (PORCINE) 1000 UNIT/ML IJ SOLN
INTRAMUSCULAR | Status: AC
  Administered 2020-01-28: 1000 [IU]
  Filled 2020-01-28: qty 2

## 2020-01-28 MED ORDER — CEPHALEXIN 500 MG PO CAPS: 500.0000 mg | ORAL_CAPSULE | Freq: Two times a day (BID) | ORAL | 0 refills | Status: AC

## 2020-01-28 MED ORDER — CEPHALEXIN 500 MG PO CAPS: 500.0000 mg | ORAL_CAPSULE | Freq: Four times a day (QID) | ORAL | 0 refills | Status: DC

## 2020-01-28 NOTE — Progress Notes (Addendum)
[facility] Progress Note   Subjective:  Seen in dialysis unit. UF 1.5L. No new complaints this am.   Objective Vitals:   01/28/20 0700 01/28/20 0715 01/28/20 0730 01/28/20 0800  BP: 125/79 120/60 (!) 108/42 (!) 117/53  Pulse: 62 66 72 62  Resp:      Temp:      TempSrc:      SpO2:      Weight:      Height:        Weight change:    Physical Exam General: Obese male, nad  Heart: RRR Lungs: Clear bilaterally  Abdomen: soft non-tender Extremities: RLE w mild erythema/swelling, improving Dialysis Access: RUE AVF +bruit    Additional Objective Labs: Basic Metabolic Panel: Recent Labs  Lab 01/23/20 0352 01/25/20 0619 01/28/20 0310  NA 139 134* 139  K 4.1 4.2 4.7  CL 97* 95* 96*  CO2 29 24 29   GLUCOSE 94 132* 100*  BUN 16 37* 35*  CREATININE 5.01* 7.80* 8.28*  CALCIUM 10.1 10.9* 11.6*  PHOS  --  5.4*  --    CBC: Recent Labs  Lab 01/21/20 1345 01/21/20 1345 01/22/20 0525 01/22/20 0525 01/23/20 0352 01/25/20 0618 01/28/20 0310  WBC 15.3*   < > 12.8*   < > 7.9 8.0 9.7  NEUTROABS 12.9*  --   --   --   --   --   --   HGB 11.7*   < > 10.9*   < > 10.7* 11.5* 12.0*  HCT 38.7*   < > 36.5*   < > 35.8* 37.3* 39.5  MCV 91.7  --  90.6  --  91.8 89.7 89.0  PLT 147*   < > 147*   < > 153 193 217   < > = values in this interval not displayed.   Blood Culture    Component Value Date/Time   SDES BLOOD LEFT HAND 01/21/2020 2035   SPECREQUEST  01/21/2020 2035    BOTTLES DRAWN AEROBIC AND ANAEROBIC Blood Culture adequate volume   CULT  01/21/2020 2035    NO GROWTH 5 DAYS Performed at [facility], [place] [address], [place], [place] [address]    REPTSTATUS 01/26/2020 FINAL 01/21/2020 2035    Dialysis Orders: MWF East 4.5h 450/800 134kg 2/2 bath P4 AVF Hep 2000 sensipar 180 tiw Calcitriol 0.5 tiw  Assessment/Plan: 1. Cellulitis-RLE - Antibiotics narrowed to IV Ancef -per primary . Blood cultures negative.  Improving.  2. ESRD  -HD MWF. Refused dialysis Wednesday. HD off schedule today. Back on schedule tomorrow.  3. Anemia - Hgb stable. No ESA needs.  4. Secondary hyperparathyroidism - Corrected Ca high ? immobility ->use 2 Ca bath. Increased Sensipar to 180 TIW with HD. Hold calcitriol for now.  5. HTN/volume - Did not tolerate UF challenge Monday. At EDW by weights here.  On midodrine for BP support. UF as tolerated.  6. Nutrition - protein suppl/Nepro 7. Chronic AFib- on Eliquis and BB  8. CAD - hx CABG  9. Elevated INR - not on coumadin  Only Eliquis  Lynnda Child PA-C [facility] 01/26/2020,9:49 AM   Medications: . sodium chloride Stopped (01/27/20 0622)  .  ceFAZolin (ANCEF) IV 1 g (01/27/20 1210)   . (feeding supplement) PROSource Plus  30 mL Oral BID BM  . allopurinol  100 mg Oral Daily  . amiodarone  200 mg Oral Daily  . apixaban  5 mg Oral BID  . aspirin EC  81  mg Oral Daily  . bisoprolol  5 mg Oral Q T,Th,S,Su  . Chlorhexidine Gluconate Cloth  6 each Topical Q0600  . Chlorhexidine Gluconate Cloth  6 each Topical Q0600  . cinacalcet  180 mg Oral Q M,W,F-HD  . feeding supplement  1 Container Oral TID BM  . feeding supplement (PRO-STAT SUGAR FREE 64)  30 mL Oral BID  . ferric citrate  420 mg Oral TID WC  . gabapentin  100 mg Oral Once per day on Mon Wed Fri  . heparin sodium (porcine)      . midodrine  10 mg Oral Q M,W,F-HD  . multivitamin  1 tablet Oral QHS  . rosuvastatin  10 mg Oral Daily    Nephrology attending: Patient was seen and examined.  Chart reviewed.  I agree with assessment plan as outlined above. Patient refused dialysis yesterday therefore receiving treatment today off schedule.  Felt weak after dialysis therefore lowering UF goal. Calcium level elevated likely due to immobility.  Holding calcitriol.  Continue binders and Sensipar.  Monitor lab.  Hemoglobin at goal.  Cellulitis improving.  May switch to oral antibiotics, defer to primary team.  Katheran James, MD [facility].

## 2020-01-28 NOTE — Discharge Summary (Signed)
Physician Discharge Summary  Alex Williams EXB:[id number] DOB: [birth year] DOA: 01/21/2020  PCP: [name], MD  Admit date: 01/21/2020 Discharge date: 01/28/2020  Time spent: 35 minutes  Recommendations for Outpatient Follow-up:  PCP in 1 week Podiatry follow-up to evaluate great toe of right foot  Discharge Diagnoses:  Recurrent right lower leg cellulitis Morbid obesity Venous insufficiency   HYPERCHOLESTEROLEMIA   Essential hypertension   Morbid obesity with BMI of 45.0-49.9, adult (HCC)   S/P CABG x 3   Coronary artery disease   ESRD on dialysis ([place])   Cellulitis   Atrial fibrillation, chronic (HCC)   GERD (gastroesophageal reflux disease)   Discharge Condition: Improved  Diet recommendation: Renal  Filed Weights   01/27/20 2057 01/28/20 0655 01/28/20 1130  Weight: 135 kg 134 kg 133 kg    History of present illness:  Alex Williams is an 69 y.o. 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 fib on Eliquis,OSAand gout who presented to the emergency department due to worsening right lower extremity swelling and redness.Patient recently had cellulitis to the right lower extremity since last 3 to 4 months and he was admitted to this facility on 6/7-6/8 due toRLEabscess during which he was treated with IV vancomycin and I&D with done at that time and patient was discharged with doxycycline. Since discharge, he has had another round of oral doxycycline due to worsening rash on right leg which started with small round red rash on the medial side of the leg,this has since spread to theankleas well as right thigh.He was evaluated by his primary care provider earlier today who recommended that due to his symptoms he should go to the ED for further evaluation and management.   Hospital Course:   Recurrent cellulitis of right lower extremity -Failed outpatient antibiotic treatment -Remote  history of vein harvesting on his right lower leg for CABG, with mild chronic edema at baseline which is likely his risk factor for recurrent cellulitis -Was admitted a month ago for cellulitis, had a small abscess at the time which was treated with I&D, cultures at the time grew MSSA -Clinically treated with IV vancomycin and Zosyn again this time, it was nonpurulent hence changed to IV Ancef and subsequently to oral Keflex today at discharge -Follow-up with podiatry to evaluate great toe -Patient also advised to wear compression stockings especially of his right leg after the infection has completely subsided, long-term will also benefit from weight loss which will help his venous insufficiency -Continue oral Keflex at discharge for 5 more days, follow-up with PCP in 1 week  Essential hypertension Continue home medication   Hypercholesterolemia Continue home Crestor   ESRD on dialysison MWF Continue home calcitriol and Sensipar  Nephrology consulted  Coronary artery diseases/pCABG x3 Continue home aspirin and statin  Atrial fibrillation chronic Continue Eliquis and bisoprolol   GERD Continue Protonix  Morbid obesity Body mass index is 40.36 kg/m. - weight loss recommended    Discharge Exam: Vitals:   01/28/20 1135 01/28/20 1224  BP: 138/66 (!) 144/94  Pulse: 65 68  Resp: 16 20  Temp: (!) 95.6 F (35.3 C) 97.6 F (36.4 C)  SpO2:  97%    General: AAOx3 Cardiovascular: S1S2/RRR Respiratory: CTAB  Discharge Instructions   Discharge Instructions    Discharge instructions   Complete by: As directed    Renal Diet   Increase activity slowly   Complete by: As directed    No wound  care   Complete by: As directed      Allergies as of 01/28/2020      Reactions   Codeine Phosphate Other (See Comments)   Hyperactive       Medication List    STOP taking these medications   doxycycline 100 MG capsule Commonly known as: VIBRAMYCIN   doxycycline 100  MG tablet Commonly known as: VIBRA-TABS     TAKE these medications   allopurinol 100 MG tablet Commonly known as: ZYLOPRIM Take 1 tablet (100 mg total) by mouth daily.   amiodarone 200 MG tablet Commonly known as: PACERONE TAKE 1 TABLET BY MOUTH EVERY DAY   aspirin 81 MG EC tablet Take 1 tablet (81 mg total) by mouth daily.   bisoprolol 5 MG tablet Commonly known as: ZEBETA TAKE 1 TABLET AS DIRECTED ON NON DIALYSIS TUES,THURS,SAT,SUN NEEDS APPT What changed: See the new instructions.   calcitRIOL 0.25 MCG capsule Commonly known as: ROCALTROL Take 1 capsule (0.25 mcg total) by mouth every Monday, Wednesday, and Friday with hemodialysis.   cephALEXin 500 MG capsule Commonly known as: KEFLEX Take 1 capsule (500 mg total) by mouth 2 (two) times daily for 5 days.   cinacalcet 90 MG tablet Commonly known as: SENSIPAR SMARTSIG:1 Tablet(s) By Mouth Every Evening   cinacalcet 30 MG tablet Commonly known as: SENSIPAR Take 1 tablet (30 mg total) by mouth daily with supper.   diphenhydramine-acetaminophen 25-500 MG Tabs tablet Commonly known as: TYLENOL PM Take 1 tablet by mouth at bedtime as needed (sleep).   Eliquis 5 MG Tabs tablet Generic drug: apixaban TAKE 1 TABLET BY MOUTH TWICE A DAY   ferric citrate 1 GM 210 MG(Fe) tablet Commonly known as: AURYXIA Take 420 mg by mouth 3 (three) times daily with meals.   fluticasone 50 MCG/ACT nasal spray Commonly known as: FLONASE Place 1 spray into both nostrils daily as needed for allergies or rhinitis.   gabapentin 100 MG capsule Commonly known as: NEURONTIN TAKE 1 CAPSULE AT BEDTIME ON HD DAYS (M,W,F)   HYDROcodone-acetaminophen 5-325 MG tablet Commonly known as: NORCO/VICODIN Take 1 tablet by mouth every 6 (six) hours as needed for moderate pain.   methocarbamol 500 MG tablet Commonly known as: ROBAXIN Take 1 tablet (500 mg total) by mouth every 8 (eight) hours as needed for muscle spasms.   midodrine 10 MG  tablet Commonly known as: PROAMATINE Take 1 tablet (10 mg total) by mouth every Monday, Wednesday, and Friday with hemodialysis.   multivitamin Tabs tablet Take 1 tablet by mouth at bedtime.   ondansetron 8 MG disintegrating tablet Commonly known as: ZOFRAN-ODT Take 1 tablet (8 mg total) by mouth every 8 (eight) hours as needed for nausea or vomiting.   pantoprazole 40 MG tablet Commonly known as: PROTONIX Take 1 tablet (40 mg total) by mouth daily as needed (acid reflux).   promethazine 12.5 MG tablet Commonly known as: PHENERGAN Take 1 tablet (12.5 mg total) by mouth every 8 (eight) hours as needed for nausea or vomiting.   rosuvastatin 10 MG tablet Commonly known as: CRESTOR Take 10 mg by mouth daily.   rosuvastatin 10 MG tablet Commonly known as: CRESTOR Take 1 tablet (10 mg total) by mouth daily.      Allergies  Allergen Reactions  . Codeine Phosphate Other (See Comments)    Hyperactive     Follow-up Information    [name], MD. Schedule an appointment as soon as possible for a visit in 1 week(s).  Specialty: Family Medicine Contact information: [address] [place] [address] [phone]                The results of significant diagnostics from this hospitalization (including imaging, microbiology, ancillary and laboratory) are listed below for reference.    Significant Diagnostic Studies: IR US Guide Bx Asp/Drain  Result Date: 12/29/2019 INDICATION: Cellulitis of the right calf with focal posteromedial subcutaneous fluid collection by MRI. EXAM: IR ULTRASOUND GUIDANCE MEDICATIONS: The patient is currently admitted to the hospital and receiving intravenous antibiotics. The antibiotics were administered within an appropriate time frame prior to the initiation of the procedure. ANESTHESIA/SEDATION: None COMPLICATIONS: None immediate. PROCEDURE: Informed written consent was obtained from the patient after a thorough discussion of the procedural  risks, benefits and alternatives. All questions were addressed. Maximal Sterile Barrier Technique was utilized including caps, mask, sterile gowns, sterile gloves, sterile drape, hand hygiene and skin antiseptic. A timeout was performed prior to the initiation of the procedure. Ultrasound was used to localize a subcutaneous fluid collection in the medial aspect of the proximal right calf. 1% lidocaine was infiltrated for local anesthesia. An 18 gauge trocar needle was advanced into the collection and aspiration performed. Fluid sample was sent for culture analysis. Additional ultrasound was performed. FINDINGS: Complex appearing subcutaneous fluid collection measures approximately 3.1 x 1.3 x 2.9 cm. Approximately 4-5 mL of fluid and debris were able to be aspirated from the collection resulting in significant decompression. The fluid sample was sent for culture analysis. IMPRESSION: Ultrasound-guided aspiration right calf subcutaneous fluid collection yielding fluid and debris. Approximately 4-5 mL of fluid and debris was able to be aspirated resulting in decompression of the collection. Electronically Signed   By: Aletta Edouard M.D.   On: 12/29/2019 16:52   VAS [place] LOWER EXTREMITY VENOUS (DVT) (ONLY [facility] & [facility])  Result Date: 01/21/2020  Lower Venous DVTStudy Indications: Worsening erythema and pain on thigh, erythema of calf x 1 month.  Other Factors: Cellulitis on antibiotics, Hx GSV harvest. Limitations: Body habitus and patient pain level. Comparison Study: 12/28/19 negative Performing Technologist: June Leap RDMS, RVT  Examination Guidelines: A complete evaluation includes B-mode imaging, spectral Doppler, color Doppler, and power Doppler as needed of all accessible portions of each vessel. Bilateral testing is considered an integral part of a complete examination. Limited examinations for reoccurring indications may be performed as noted. The reflux portion of the exam is performed with the patient in  reverse Trendelenburg.  +---------+---------------+---------+-----------+----------+--------------+ RIGHT    CompressibilityPhasicitySpontaneityPropertiesThrombus Aging +---------+---------------+---------+-----------+----------+--------------+ CFV      Full           Yes      Yes                                 +---------+---------------+---------+-----------+----------+--------------+ SFJ      Full                                                        +---------+---------------+---------+-----------+----------+--------------+ FV Prox  Full                                                        +---------+---------------+---------+-----------+----------+--------------+  FV Mid   Full                                                        +---------+---------------+---------+-----------+----------+--------------+ FV DistalFull                                                        +---------+---------------+---------+-----------+----------+--------------+ PFV      Full                                                        +---------+---------------+---------+-----------+----------+--------------+ POP      Full           Yes      Yes                                 +---------+---------------+---------+-----------+----------+--------------+ PTV      Full                                                        +---------+---------------+---------+-----------+----------+--------------+ PERO     Full                                                        +---------+---------------+---------+-----------+----------+--------------+   +----+---------------+---------+-----------+----------+------------------------+ LEFTCompressibilityPhasicitySpontaneityProperties                         +----+---------------+---------+-----------+----------+------------------------+ CFV                                              not imaged due to                                                          patient positioning      +----+---------------+---------+-----------+----------+------------------------+     Summary: RIGHT: - There is no evidence of deep vein thrombosis in the lower extremity.  - No cystic structure found in the popliteal fossa.   *See table(s) above for measurements and observations. Electronically signed by Servando Snare MD on 01/21/2020 at 4:32:00 PM.    Final     Microbiology: Recent Results (from the past 240 hour(s))  Blood culture (routine x 2)     Status: None   Collection Time: 01/21/20  7:56 PM   Specimen: BLOOD  Result Value  Ref Range Status   Specimen Description BLOOD LEFT ARM  Final   Special Requests   Final    BOTTLES DRAWN AEROBIC AND ANAEROBIC Blood Culture adequate volume   Culture   Final    NO GROWTH 5 DAYS Performed at [facility], [address] [place], [place] [address]    Report Status 01/26/2020 FINAL  Final  Blood culture (routine x 2)     Status: None   Collection Time: 01/21/20  8:35 PM   Specimen: BLOOD LEFT HAND  Result Value Ref Range Status   Specimen Description BLOOD LEFT HAND  Final   Special Requests   Final    BOTTLES DRAWN AEROBIC AND ANAEROBIC Blood Culture adequate volume   Culture   Final    NO GROWTH 5 DAYS Performed at [facility], [place] [address] [place], [place] [address]    Report Status 01/26/2020 FINAL  Final  SARS Coronavirus 2 by RT PCR (hospital order, performed in [facility] hospital lab) Nasopharyngeal Nasopharyngeal Swab     Status: None   Collection Time: 01/21/20  9:13 PM   Specimen: Nasopharyngeal Swab  Result Value Ref Range Status   SARS Coronavirus 2 NEGATIVE NEGATIVE Final    Comment: (NOTE) SARS-CoV-2 target nucleic acids are NOT DETECTED.  The SARS-CoV-2 RNA is generally detectable in upper and lower respiratory specimens during the acute phase of infection. The lowest concentration of SARS-CoV-2 viral copies this assay can  detect is 250 copies / mL. A negative result does not preclude SARS-CoV-2 infection and should not be used as the sole basis for treatment or other patient management decisions.  A negative result may occur with improper specimen collection / handling, submission of specimen other than nasopharyngeal swab, presence of viral mutation(s) within the areas targeted by this assay, and inadequate number of viral copies (<250 copies / mL). A negative result must be combined with clinical observations, patient history, and epidemiological information.  Fact Sheet for Patients:   StrictlyIdeas.no  Fact Sheet for Healthcare Providers: BankingDealers.co.za  This test is not yet approved or  cleared by the [place] FDA and has been authorized for detection and/or diagnosis of SARS-CoV-2 by FDA under an Emergency Use Authorization (EUA).  This EUA will remain in effect (meaning this test can be used) for the duration of the COVID-19 declaration under Section 564(b)(1) of the Act, 21 U.S.C. section 360bbb-3(b)(1), unless the authorization is terminated or revoked sooner.  Performed at [facility], [place] [address], [place], [place] [address]   MRSA PCR Screening     Status: None   Collection Time: 01/24/20  7:22 PM   Specimen: Nasal Mucosa; Nasopharyngeal  Result Value Ref Range Status   MRSA by PCR NEGATIVE NEGATIVE Final    Comment:        The GeneXpert MRSA Assay (FDA approved for NASAL specimens only), is one component of a comprehensive MRSA colonization surveillance program. It is not intended to diagnose MRSA infection nor to guide or monitor treatment for MRSA infections. Performed at [facility], [place] [address], [place], [place] [address]      Labs: Basic Metabolic Panel: Recent Labs  Lab 01/22/20 0525 01/23/20 0352 01/25/20 0619 01/28/20 0310  NA 135 139 134* 139  K 3.8 4.1 4.2 4.7  CL 93* 97* 95* 96*   CO2 26 29 24 29   GLUCOSE 99 94 132* 100*  BUN 27* 16 37* 35*  CREATININE 7.37* 5.01*  7.80* 8.28*  CALCIUM 10.2 10.1 10.9* 11.6*  PHOS  --   --  5.4*  --    Liver Function Tests: Recent Labs  Lab 01/22/20 0525 01/25/20 0619  AST 17  --   ALT 15  --   ALKPHOS 73  --   BILITOT 2.1*  --   PROT 5.4*  --   ALBUMIN 2.7* 2.7*   No results for input(s): LIPASE, AMYLASE in the last 168 hours. No results for input(s): AMMONIA in the last 168 hours. CBC: Recent Labs  Lab 01/22/20 0525 01/23/20 0352 01/25/20 0618 01/28/20 0310  WBC 12.8* 7.9 8.0 9.7  HGB 10.9* 10.7* 11.5* 12.0*  HCT 36.5* 35.8* 37.3* 39.5  MCV 90.6 91.8 89.7 89.0  PLT 147* 153 193 217   Cardiac Enzymes: No results for input(s): CKTOTAL, CKMB, CKMBINDEX, TROPONINI in the last 168 hours. BNP: BNP (last 3 results) Recent Labs    01/30/19 0819 02/05/19 0951  BNP 1,138.2* 1,186.2*    ProBNP (last 3 results) No results for input(s): PROBNP in the last 8760 hours.  CBG: No results for input(s): GLUCAP in the last 168 hours.  Signed:  Domenic Polite MD.  [facility] 01/28/2020, 1:56 PM

## 2020-01-29 ENCOUNTER — Telehealth: Payer: Self-pay | Admitting: Nephrology

## 2020-01-29 NOTE — Telephone Encounter (Signed)
Transition of Care Contact from [facility]  Date of Discharge: 01/28/20 Date of Contact: 01/29/20 Method of contact: phone - unsuccessful  Attempted to contact patient to discuss transition of care from inpatient admission. Patient not home. Message was left informing patient we would attempt to call them again and if unable to reach will follow up at dialysis.  Jen Mow, PA-C [facility] Pager: [phone]

## 2020-01-30 ENCOUNTER — Telehealth: Payer: Self-pay | Admitting: Nephrology

## 2020-01-30 NOTE — Telephone Encounter (Signed)
Transition of Care Contact from [place]   Date of Discharge: 01/28/20 Date of Contact: 01/30/20 Method of contact: phone Talked to patient   Patient contacted to discuss transition of care form recent hospitaliztion. Patient was admitted to [facility] from 01/21/20 to 01/28/20 with the discharge diagnosis of RLE cellulitis.    Medication changes were reviewed - d/c doxycycline, start keflex 500mg  qd x 5.   Patient will follow up with is outpatient dialysis center 02/01/20.   Other follow up needs include none identified.   Jen Mow, PA-C [facility] Pager: [phone]

## 2020-02-03 ENCOUNTER — Emergency Department (HOSPITAL_COMMUNITY): Payer: Medicare Other

## 2020-02-03 ENCOUNTER — Inpatient Hospital Stay (HOSPITAL_COMMUNITY)
Admission: EM | Admit: 2020-02-03 | Discharge: 2020-02-07 | DRG: 286 | Disposition: A | Discharge status: Home / Self Care | Payer: Medicare Other | Attending: Internal Medicine | Admitting: Internal Medicine

## 2020-02-03 DIAGNOSIS — Z885 Allergy status to narcotic agent status: Secondary | ICD-10-CM

## 2020-02-03 DIAGNOSIS — I340 Nonrheumatic mitral (valve) insufficiency: Secondary | ICD-10-CM | POA: Diagnosis present

## 2020-02-03 DIAGNOSIS — Z713 Dietary counseling and surveillance: Secondary | ICD-10-CM

## 2020-02-03 DIAGNOSIS — I248 Other forms of acute ischemic heart disease: Principal | ICD-10-CM | POA: Diagnosis present

## 2020-02-03 DIAGNOSIS — R079 Chest pain, unspecified: Secondary | ICD-10-CM

## 2020-02-03 DIAGNOSIS — Z87891 Personal history of nicotine dependence: Secondary | ICD-10-CM

## 2020-02-03 DIAGNOSIS — G4733 Obstructive sleep apnea (adult) (pediatric): Secondary | ICD-10-CM | POA: Diagnosis present

## 2020-02-03 DIAGNOSIS — Z992 Dependence on renal dialysis: Secondary | ICD-10-CM

## 2020-02-03 DIAGNOSIS — I5023 Acute on chronic systolic (congestive) heart failure: Secondary | ICD-10-CM | POA: Diagnosis present

## 2020-02-03 DIAGNOSIS — E785 Hyperlipidemia, unspecified: Secondary | ICD-10-CM | POA: Diagnosis present

## 2020-02-03 DIAGNOSIS — J9811 Atelectasis: Secondary | ICD-10-CM | POA: Diagnosis present

## 2020-02-03 DIAGNOSIS — I5043 Acute on chronic combined systolic (congestive) and diastolic (congestive) heart failure: Secondary | ICD-10-CM | POA: Diagnosis present

## 2020-02-03 DIAGNOSIS — I25119 Atherosclerotic heart disease of native coronary artery with unspecified angina pectoris: Secondary | ICD-10-CM | POA: Diagnosis present

## 2020-02-03 DIAGNOSIS — Z79899 Other long term (current) drug therapy: Secondary | ICD-10-CM

## 2020-02-03 DIAGNOSIS — I255 Ischemic cardiomyopathy: Secondary | ICD-10-CM | POA: Diagnosis present

## 2020-02-03 DIAGNOSIS — Z96652 Presence of left artificial knee joint: Secondary | ICD-10-CM | POA: Diagnosis present

## 2020-02-03 DIAGNOSIS — I959 Hypotension, unspecified: Secondary | ICD-10-CM | POA: Diagnosis present

## 2020-02-03 DIAGNOSIS — I350 Nonrheumatic aortic (valve) stenosis: Secondary | ICD-10-CM | POA: Diagnosis present

## 2020-02-03 DIAGNOSIS — K219 Gastro-esophageal reflux disease without esophagitis: Secondary | ICD-10-CM | POA: Diagnosis present

## 2020-02-03 DIAGNOSIS — R0602 Shortness of breath: Secondary | ICD-10-CM

## 2020-02-03 DIAGNOSIS — Z8249 Family history of ischemic heart disease and other diseases of the circulatory system: Secondary | ICD-10-CM

## 2020-02-03 DIAGNOSIS — I2510 Atherosclerotic heart disease of native coronary artery without angina pectoris: Secondary | ICD-10-CM | POA: Diagnosis present

## 2020-02-03 DIAGNOSIS — I214 Non-ST elevation (NSTEMI) myocardial infarction: Secondary | ICD-10-CM | POA: Diagnosis present

## 2020-02-03 DIAGNOSIS — R0600 Dyspnea, unspecified: Secondary | ICD-10-CM | POA: Diagnosis not present

## 2020-02-03 DIAGNOSIS — Z7289 Other problems related to lifestyle: Secondary | ICD-10-CM

## 2020-02-03 DIAGNOSIS — I252 Old myocardial infarction: Secondary | ICD-10-CM

## 2020-02-03 DIAGNOSIS — I509 Heart failure, unspecified: Secondary | ICD-10-CM

## 2020-02-03 DIAGNOSIS — M109 Gout, unspecified: Secondary | ICD-10-CM | POA: Diagnosis present

## 2020-02-03 DIAGNOSIS — I482 Chronic atrial fibrillation, unspecified: Secondary | ICD-10-CM | POA: Diagnosis present

## 2020-02-03 DIAGNOSIS — Z9115 Patient's noncompliance with renal dialysis: Secondary | ICD-10-CM

## 2020-02-03 DIAGNOSIS — I471 Supraventricular tachycardia: Secondary | ICD-10-CM | POA: Diagnosis not present

## 2020-02-03 DIAGNOSIS — D631 Anemia in chronic kidney disease: Secondary | ICD-10-CM | POA: Diagnosis present

## 2020-02-03 DIAGNOSIS — I132 Hypertensive heart and chronic kidney disease with heart failure and with stage 5 chronic kidney disease, or end stage renal disease: Secondary | ICD-10-CM | POA: Diagnosis present

## 2020-02-03 DIAGNOSIS — Z7901 Long term (current) use of anticoagulants: Secondary | ICD-10-CM

## 2020-02-03 DIAGNOSIS — Z6839 Body mass index (BMI) 39.0-39.9, adult: Secondary | ICD-10-CM

## 2020-02-03 DIAGNOSIS — Z20822 Contact with and (suspected) exposure to covid-19: Secondary | ICD-10-CM | POA: Diagnosis present

## 2020-02-03 DIAGNOSIS — Z951 Presence of aortocoronary bypass graft: Secondary | ICD-10-CM

## 2020-02-03 DIAGNOSIS — N186 End stage renal disease: Secondary | ICD-10-CM | POA: Diagnosis present

## 2020-02-03 DIAGNOSIS — I9589 Other hypotension: Secondary | ICD-10-CM | POA: Diagnosis present

## 2020-02-03 DIAGNOSIS — R0609 Other forms of dyspnea: Secondary | ICD-10-CM

## 2020-02-03 DIAGNOSIS — E669 Obesity, unspecified: Secondary | ICD-10-CM | POA: Diagnosis present

## 2020-02-03 DIAGNOSIS — Z7982 Long term (current) use of aspirin: Secondary | ICD-10-CM

## 2020-02-03 DIAGNOSIS — Z833 Family history of diabetes mellitus: Secondary | ICD-10-CM

## 2020-02-03 LAB — CBC
HCT: 32.9 % — ABNORMAL LOW (ref 39.0–52.0)
Hemoglobin: 9.8 g/dL — ABNORMAL LOW (ref 13.0–17.0)
MCH: 27.5 pg (ref 26.0–34.0)
MCHC: 29.8 g/dL — ABNORMAL LOW (ref 30.0–36.0)
MCV: 92.2 fL (ref 80.0–100.0)
Platelets: 194 10*3/uL (ref 150–400)
RBC: 3.57 MIL/uL — ABNORMAL LOW (ref 4.22–5.81)
RDW: 21 % — ABNORMAL HIGH (ref 11.5–15.5)
WBC: 13.5 10*3/uL — ABNORMAL HIGH (ref 4.0–10.5)
nRBC: 0 % (ref 0.0–0.2)

## 2020-02-03 LAB — BASIC METABOLIC PANEL
Anion gap: 17 — ABNORMAL HIGH (ref 5–15)
BUN: 61 mg/dL — ABNORMAL HIGH (ref 8–23)
CO2: 26 mmol/L (ref 22–32)
Calcium: 11.5 mg/dL — ABNORMAL HIGH (ref 8.9–10.3)
Chloride: 96 mmol/L — ABNORMAL LOW (ref 98–111)
Creatinine, Ser: 8.99 mg/dL — ABNORMAL HIGH (ref 0.61–1.24)
GFR calc Af Amer: 6 mL/min — ABNORMAL LOW (ref >60)
GFR calc non Af Amer: 5 mL/min — ABNORMAL LOW (ref >60)
Glucose, Bld: 98 mg/dL (ref 70–99)
Potassium: 4.4 mmol/L (ref 3.5–5.1)
Sodium: 139 mmol/L (ref 135–145)

## 2020-02-03 LAB — TROPONIN I (HIGH SENSITIVITY)
Troponin I (High Sensitivity): 108 ng/L (ref <18)
Troponin I (High Sensitivity): 119 ng/L (ref <18)

## 2020-02-03 MED ORDER — NITROGLYCERIN 0.4 MG SL SUBL
0.4000 mg | SUBLINGUAL_TABLET | SUBLINGUAL | Status: DC | PRN
  Administered 2020-02-04: 0.4 mg via SUBLINGUAL
  Filled 2020-02-03: qty 1

## 2020-02-03 MED ORDER — SODIUM CHLORIDE 0.9% FLUSH: 3.0000 mL | Freq: Once | INTRAVENOUS | Status: DC

## 2020-02-03 MED ORDER — ASPIRIN 81 MG PO CHEW
324.0000 mg | CHEWABLE_TABLET | Freq: Once | ORAL | Status: AC
  Administered 2020-02-03: 324 mg via ORAL
  Filled 2020-02-03: qty 4

## 2020-02-03 NOTE — ED Provider Notes (Addendum)
TIME SEEN: 11:20 PM  CHIEF COMPLAINT: Shortness of breath  HPI: Patient is a 69 year old male with history of hypertension, hyperlipidemia, CAD status post anterior STEMI and CABG in September 2020, A. fib who presents to the emergency department with 2 months of worsening shortness of breath that has been significantly worse over the past week and a half.  He reports associated dry cough, diarrhea, generalized weakness and fatigue.  He states he has had left-sided chest pressure intermittently that he is having currently.  Was just admitted to the hospital beginning of July for lower extremity cellulitis which he states has improved.  He has not had any fevers in a week and a half.  He states he always has nausea and vomiting after dialysis but no other vomiting recently.  Missed dialysis yesterday due to feeling poorly.  He has had both COVID-19 vaccinations.  He is chronically on Eliquis and reports compliance.  He denies history of PE or DVT.  He states he is not sure if this feels like his previous anginal equivalent.  He states at that time he just felt "fidgety".  Cath 04/16/2019:   Ost RCA to Prox RCA lesion is 50% stenosed.  RPDA lesion is 99% stenosed.  Ost Cx to Prox Cx lesion is 90% stenosed.  1st Mrg lesion is 80% stenosed.  Mid LAD lesion is 100% stenosed.  Ost LAD to Mid LAD lesion is 30% stenosed.  Balloon angioplasty was performed using a BALLOON TREK OTW 2.5X12.  Post intervention, there is a 100% residual stenosis.  Mid Cx to Dist Cx lesion is 99% stenosed.   1. Acute anterior STEMI with critical distal left main stenosis involving the Circumflex and LAD. Chronic occlusion of the mid LAD. Severe stenosis PDA. Ischemic cascade this am likely driven by atrial flutter with RVR.  2. Cardiogenic shock 3. Successful placement of Impella CP for hemodynamic support 4. Balloon angioplasty of the mid LAD. Unable to restore flow down the LAD as this is felt to be a chronic  occlusion.   Recommendations: He will be taken to the OR for emergent CABG. The Impella will remain in place for now.   Echo 10/08/2019:  IMPRESSIONS    1. Left ventricular ejection fraction, by estimation, is 35%. The left  ventricle has moderately decreased function. The left ventricle  demonstrates regional wall motion abnormalities, peri-apical akinesis. The  left ventricular internal cavity size was  mildly dilated.  2. Right ventricular systolic function is mildly reduced. The right  ventricular size is normal.  3. No ASD/PFO by color doppler. Left atrial size was mildly dilated. No  left atrial/left atrial appendage thrombus was detected.  4. Peak RV-RA gradient 38 mmHg.  5. The mitral valve was calcified with restriction of the posterior  leaflet. There was moderate mitral regurgitation. There was flattening of  the pulmonary vein systolic doppler signal but no reversal. By PISA, ERO  0.14 cm^2 (suggests mild MR but I  think it is moderate). No mitral stenosis, mean gradient 2 mmHg  6. The aortic valve was moderately calcified and trileaflet. Mild AS with  mean gradient 18 mmHg and AVA 1.7 cm^2. Aortic valve regurgitation is not  visualized.  7. Normal caliber aorta with grade 3 plaque.    ROS: See HPI Constitutional: no fever  Eyes: no drainage  ENT: no runny nose   Cardiovascular:   chest pain  Resp: SOB  GI: Chronic vomiting after dialysis GU: no dysuria Integumentary: no rash  Allergy: no  hives  Musculoskeletal: Chronic bilateral leg swelling  Neurological: no slurred speech ROS otherwise negative  PAST MEDICAL HISTORY/PAST SURGICAL HISTORY:  Past Medical History:  Diagnosis Date  . Cervical disc disease   . Chronic kidney disease   . COLONIC POLYPS, HX OF 06/27/2007  . EXOGENOUS OBESITY 01/30/2010  . GERD (gastroesophageal reflux disease)    PMH  . GOUT 01/30/2010  . HYPERCHOLESTEROLEMIA 06/30/2007  . HYPERTENSION 06/27/2007  . LOW BACK PAIN  06/27/2007  . NEPHROLITHIASIS, HX OF 06/27/2007  . OSTEOARTHRITIS 06/27/2007  . SLEEP APNEA, OBSTRUCTIVE, MODERATE 01/27/2009  . Wears dentures    upper    MEDICATIONS:  Prior to Admission medications   Medication Sig Start Date End Date Taking? Authorizing Provider  allopurinol (ZYLOPRIM) 100 MG tablet Take 1 tablet (100 mg total) by mouth daily. 05/08/19   Angiulli, Lavon Paganini, PA-C  amiodarone (PACERONE) 200 MG tablet TAKE 1 TABLET BY MOUTH EVERY DAY 01/26/20   Larey Dresser, MD  aspirin EC 81 MG EC tablet Take 1 tablet (81 mg total) by mouth daily. 05/01/19   Elgie Collard, PA-C  bisoprolol (ZEBETA) 5 MG tablet TAKE 1 TABLET AS DIRECTED ON NON DIALYSIS TUES,THURS,SAT,SUN NEEDS APPT Patient taking differently: Take 5 mg by mouth as directed. Take 1 tablet as directed on non dialysis (Tues, Thurs, Sat, Sun) needs appt 11/26/19   Larey Dresser, MD  calcitRIOL (ROCALTROL) 0.25 MCG capsule Take 1 capsule (0.25 mcg total) by mouth every Monday, Wednesday, and Friday with hemodialysis. 12/30/19   Seawell, Jaimie A, DO  cinacalcet (SENSIPAR) 30 MG tablet Take 1 tablet (30 mg total) by mouth daily with supper. 01/21/20   Posey Boyer, MD  cinacalcet (SENSIPAR) 90 MG tablet SMARTSIG:1 Tablet(s) By Mouth Every Evening Patient not taking: Reported on 01/21/2020 01/15/20   [provider]  diphenhydramine-acetaminophen (TYLENOL PM) 25-500 MG TABS tablet Take 1 tablet by mouth at bedtime as needed (sleep).     [provider]  ELIQUIS 5 MG TABS tablet TAKE 1 TABLET BY MOUTH TWICE A DAY 10/21/19   Larey Dresser, MD  ferric citrate (AURYXIA) 1 GM 210 MG(Fe) tablet Take 420 mg by mouth 3 (three) times daily with meals.     [provider]  fluticasone (FLONASE) 50 MCG/ACT nasal spray Place 1 spray into both nostrils daily as needed for allergies or rhinitis.    [provider]  gabapentin (NEURONTIN) 100 MG capsule TAKE 1 CAPSULE AT BEDTIME ON HD DAYS (M,W,F) 12/17/19    [name], MD  HYDROcodone-acetaminophen (NORCO/VICODIN) 5-325 MG tablet Take 1 tablet by mouth every 6 (six) hours as needed for moderate pain. 01/01/20   [name], MD  methocarbamol (ROBAXIN) 500 MG tablet Take 1 tablet (500 mg total) by mouth every 8 (eight) hours as needed for muscle spasms. 01/01/20   [name], MD  midodrine (PROAMATINE) 10 MG tablet Take 1 tablet (10 mg total) by mouth every Monday, Wednesday, and Friday with hemodialysis. 05/08/19   Angiulli, Lavon Paganini, PA-C  multivitamin (RENA-VIT) TABS tablet Take 1 tablet by mouth at bedtime. 05/07/19   Angiulli, Lavon Paganini, PA-C  ondansetron (ZOFRAN-ODT) 8 MG disintegrating tablet Take 1 tablet (8 mg total) by mouth every 8 (eight) hours as needed for nausea or vomiting. 10/20/19   [name], MD  pantoprazole (PROTONIX) 40 MG tablet Take 1 tablet (40 mg total) by mouth daily as needed (acid reflux). 01/21/20   Posey Boyer, MD  promethazine (PHENERGAN) 12.5 MG tablet Take 1 tablet (12.5 mg total) by mouth every 8 (eight) hours as needed for nausea or vomiting. 01/01/20   [name], MD  rosuvastatin (CRESTOR) 10 MG tablet Take 1 tablet (10 mg total) by mouth daily. 09/29/19 12/31/19  Larey Dresser, MD  rosuvastatin (CRESTOR) 10 MG tablet Take 10 mg by mouth daily.    [provider]    ALLERGIES:  Allergies  Allergen Reactions  . Codeine Phosphate Other (See Comments)    Hyperactive     SOCIAL HISTORY:  Social History   Tobacco Use  . Smoking status: Former Smoker    Quit date: 07/23/1980    Years since quitting: 39.5  . Smokeless tobacco: Never Used  Substance Use Topics  . Alcohol use: Yes    Alcohol/week: 1.0 standard drink    Types: 1 Cans of beer per week    Comment: rare    FAMILY HISTORY: Family History  Problem Relation Age of Onset  . Hypertension Other   . Diabetes Sister   . Hyperlipidemia Sister   . Sleep apnea Sister     EXAM: BP 114/67 (BP Location: Left Arm)    Pulse 63   Temp 98 F (36.7 C) (Oral)   Resp 20   Ht 6' (1.829 m)   Wt 132 kg   SpO2 100%   BMI 39.47 kg/m  CONSTITUTIONAL: Alert and oriented and responds appropriately to questions.  Obese, chronically ill-appearing. HEAD: Normocephalic EYES: Conjunctivae clear, pupils appear equal, EOM appear intact ENT: normal nose; moist mucous membranes NECK: Supple, normal ROM CARD: RRR; S1 and S2 appreciated; no murmurs, no clicks, no rubs, no gallops RESP: Appears short of breath at rest with some tachypnea while talking to me and has to stop to catch his breath.  But sats 98% on room air.  Diminished aeration at bases bilaterally but no rhonchi, wheezing or rales.  No respiratory distress. ABD/GI: Normal bowel sounds; non-distended; soft, non-tender, no rebound, no guarding, no peritoneal signs, no hepatosplenomegaly BACK:  The back appears normal EXT: Normal ROM in all joints; no deformity noted, symmetric edema in bilateral lower extremities, no redness or warmth, compartments soft, extremities warm and well-perfused, no calf tenderness or calf swelling SKIN: Normal color for age and race; warm; no rash on exposed skin NEURO: Moves all extremities equally PSYCH: The patient's mood and manner are appropriate.   MEDICAL DECISION MAKING: Patient here with progressively worsening shortness of breath for the past couple of months worse over the past week and a half.  Reports nonproductive cough, diarrhea and feeling poorly.  Has had both COVID-19 vaccinations.  Denies any known sick contacts.  Did miss dialysis yesterday.  Also complaining of intermittent chest pain with significant cardiac history.  Troponins are elevated today but more so than previous but he is on dialysis.  Will give aspirin, nitroglycerin.  EKG shows no new ischemic change.  Chest x-ray shows retrocardiac consolidation but favor atelectasis over pneumonia.  He does have a leukocytosis of 13,000 here but rectal temperature is  normal.  Low suspicion at this time given chronicity of symptoms that he has pneumonia but will closely monitor.  I am more concerned this could be his anginal equivalent and have recommended observation admission.  He will need dialysis here in the hospital but does not need it emergently.  Patient comfortable with this plan.  ED PROGRESS: 11:57 PM Discussed patient's case with hospitalist, Dr. Cyd Silence.  I have recommended  admission and patient (and family if present) agree with this plan. Admitting physician will place admission orders.   We have reviewed patient's chest x-ray together.  We will proceed with noncontrast CT of the chest for further evaluation of this retrocardiac consolidation.  Could be fluid due to missing dialysis today.  Could also be pneumonia given he reports cough and has a leukocytosis currently.  Again less likely PE given patient reports compliance with Eliquis.   I reviewed all nursing notes, vitals, pertinent previous records and reviewed/interpreted all EKGs, lab and urine results, imaging (as available).   CT shows no infiltrate, consolidation.  Patient does have small bilateral layering pleural effusions.  Covid is negative.   EKG Interpretation  Date/Time:  Wednesday February 03 2020 16:22:12 EDT Ventricular Rate:  66 PR Interval:  216 QRS Duration: 96 QT Interval:  430 QTC Calculation: 450 R Axis:   60 Text Interpretation: Sinus rhythm with 1st degree A-V block Otherwise normal ECG Confirmed by Pryor Curia [phone]) on 02/03/2020 11:20:33 PM          Alex Williams was evaluated in Emergency Department on 02/03/2020 for the symptoms described in the history of present illness. He was evaluated in the context of the global COVID-19 pandemic, which necessitated consideration that the patient might be at risk for infection with the SARS-CoV-2 virus that causes COVID-19. Institutional protocols and algorithms that pertain to the evaluation of patients at risk for  COVID-19 are in a state of rapid change based on information released by regulatory bodies including the CDC and federal and state organizations. These policies and algorithms were followed during the patient's care in the ED.      Aneira Cavitt, Delice Bison, DO 02/03/20 [id number]    Armine Rizzolo, Delice Bison, DO 02/04/20 [id number]

## 2020-02-03 NOTE — ED Notes (Signed)
Troponin 108, Shawn PA aware

## 2020-02-03 NOTE — ED Notes (Signed)
Pt request o2 for comfort- Pt is 100% on room air but given o2 per request.

## 2020-02-03 NOTE — ED Triage Notes (Signed)
BIB EMS from home. Patient reports SOB, weakness, diarrhea X several days. Recent hospitalization for cellulitis to [facility] 01/21/20-01/28/20. Dialysis patient - MWF

## 2020-02-04 ENCOUNTER — Other Ambulatory Visit: Payer: Self-pay

## 2020-02-04 ENCOUNTER — Emergency Department (HOSPITAL_COMMUNITY): Payer: Medicare Other

## 2020-02-04 ENCOUNTER — Encounter (HOSPITAL_COMMUNITY): Payer: Self-pay | Admitting: Internal Medicine

## 2020-02-04 DIAGNOSIS — I5023 Acute on chronic systolic (congestive) heart failure: Secondary | ICD-10-CM | POA: Diagnosis not present

## 2020-02-04 DIAGNOSIS — N186 End stage renal disease: Secondary | ICD-10-CM

## 2020-02-04 DIAGNOSIS — I2510 Atherosclerotic heart disease of native coronary artery without angina pectoris: Secondary | ICD-10-CM

## 2020-02-04 DIAGNOSIS — I5022 Chronic systolic (congestive) heart failure: Secondary | ICD-10-CM | POA: Diagnosis not present

## 2020-02-04 DIAGNOSIS — I10 Essential (primary) hypertension: Secondary | ICD-10-CM

## 2020-02-04 DIAGNOSIS — I482 Chronic atrial fibrillation, unspecified: Secondary | ICD-10-CM | POA: Diagnosis not present

## 2020-02-04 DIAGNOSIS — R079 Chest pain, unspecified: Secondary | ICD-10-CM | POA: Diagnosis not present

## 2020-02-04 DIAGNOSIS — Z992 Dependence on renal dialysis: Secondary | ICD-10-CM

## 2020-02-04 DIAGNOSIS — K219 Gastro-esophageal reflux disease without esophagitis: Secondary | ICD-10-CM

## 2020-02-04 LAB — SARS CORONAVIRUS 2 BY RT PCR (HOSPITAL ORDER, PERFORMED IN ~~LOC~~ HOSPITAL LAB): SARS Coronavirus 2: NEGATIVE

## 2020-02-04 LAB — CBC WITH DIFFERENTIAL/PLATELET
Abs Immature Granulocytes: 0.05 10*3/uL (ref 0.00–0.07)
Basophils Absolute: 0.1 10*3/uL (ref 0.0–0.1)
Basophils Relative: 1 %
Eosinophils Absolute: 0 10*3/uL (ref 0.0–0.5)
Eosinophils Relative: 0 %
HCT: 30.5 % — ABNORMAL LOW (ref 39.0–52.0)
Hemoglobin: 9.2 g/dL — ABNORMAL LOW (ref 13.0–17.0)
Immature Granulocytes: 1 %
Lymphocytes Relative: 18 %
Lymphs Abs: 2 10*3/uL (ref 0.7–4.0)
MCH: 27.4 pg (ref 26.0–34.0)
MCHC: 30.2 g/dL (ref 30.0–36.0)
MCV: 90.8 fL (ref 80.0–100.0)
Monocytes Absolute: 0.7 10*3/uL (ref 0.1–1.0)
Monocytes Relative: 7 %
Neutro Abs: 8.2 10*3/uL — ABNORMAL HIGH (ref 1.7–7.7)
Neutrophils Relative %: 73 %
Platelets: 174 10*3/uL (ref 150–400)
RBC: 3.36 MIL/uL — ABNORMAL LOW (ref 4.22–5.81)
RDW: 20.9 % — ABNORMAL HIGH (ref 11.5–15.5)
WBC: 11.1 10*3/uL — ABNORMAL HIGH (ref 4.0–10.5)
nRBC: 0 % (ref 0.0–0.2)

## 2020-02-04 LAB — COMPREHENSIVE METABOLIC PANEL
ALT: 9 U/L (ref 0–44)
AST: 27 U/L (ref 15–41)
Albumin: 2.7 g/dL — ABNORMAL LOW (ref 3.5–5.0)
Alkaline Phosphatase: 77 U/L (ref 38–126)
Anion gap: 18 — ABNORMAL HIGH (ref 5–15)
BUN: 69 mg/dL — ABNORMAL HIGH (ref 8–23)
CO2: 24 mmol/L (ref 22–32)
Calcium: 11.1 mg/dL — ABNORMAL HIGH (ref 8.9–10.3)
Chloride: 98 mmol/L (ref 98–111)
Creatinine, Ser: 9.59 mg/dL — ABNORMAL HIGH (ref 0.61–1.24)
GFR calc Af Amer: 6 mL/min — ABNORMAL LOW (ref >60)
GFR calc non Af Amer: 5 mL/min — ABNORMAL LOW (ref >60)
Glucose, Bld: 77 mg/dL (ref 70–99)
Potassium: 4.3 mmol/L (ref 3.5–5.1)
Sodium: 140 mmol/L (ref 135–145)
Total Bilirubin: 1.4 mg/dL — ABNORMAL HIGH (ref 0.3–1.2)
Total Protein: 5.7 g/dL — ABNORMAL LOW (ref 6.5–8.1)

## 2020-02-04 LAB — LIPID PANEL
Cholesterol: 113 mg/dL (ref 0–200)
HDL: 28 mg/dL — ABNORMAL LOW (ref >40)
LDL Cholesterol: 63 mg/dL (ref 0–99)
Total CHOL/HDL Ratio: 4 RATIO
Triglycerides: 109 mg/dL (ref <150)
VLDL: 22 mg/dL (ref 0–40)

## 2020-02-04 LAB — TROPONIN I (HIGH SENSITIVITY)
Troponin I (High Sensitivity): 452 ng/L (ref <18)
Troponin I (High Sensitivity): 3226 ng/L (ref <18)
Troponin I (High Sensitivity): 4783 ng/L (ref <18)
Troponin I (High Sensitivity): 4246 ng/L (ref <18)

## 2020-02-04 LAB — MAGNESIUM: Magnesium: 2.4 mg/dL (ref 1.7–2.4)

## 2020-02-04 LAB — TSH: TSH: 3.797 u[IU]/mL (ref 0.350–4.500)

## 2020-02-04 MED ORDER — AMIODARONE HCL 200 MG PO TABS
200.0000 mg | ORAL_TABLET | Freq: Every day | ORAL | Status: DC
  Administered 2020-02-04 – 2020-02-07 (×3): 200 mg via ORAL
  Filled 2020-02-04 (×4): qty 1

## 2020-02-04 MED ORDER — FERRIC CITRATE 1 GM 210 MG(FE) PO TABS
420.0000 mg | ORAL_TABLET | Freq: Three times a day (TID) | ORAL | Status: DC
  Administered 2020-02-04 – 2020-02-07 (×6): 420 mg via ORAL
  Filled 2020-02-04 (×11): qty 2

## 2020-02-04 MED ORDER — DIPHENHYDRAMINE HCL 25 MG PO CAPS
25.0000 mg | ORAL_CAPSULE | Freq: Once | ORAL | Status: DC
  Filled 2020-02-04: qty 1

## 2020-02-04 MED ORDER — CHLORHEXIDINE GLUCONATE CLOTH 2 % EX PADS
6.0000 | MEDICATED_PAD | Freq: Every day | CUTANEOUS | Status: DC
  Administered 2020-02-07: 6 via TOPICAL

## 2020-02-04 MED ORDER — FLUTICASONE PROPIONATE 50 MCG/ACT NA SUSP
1.0000 | Freq: Every day | NASAL | Status: DC | PRN
  Filled 2020-02-04: qty 16

## 2020-02-04 MED ORDER — TRAZODONE HCL 50 MG PO TABS
50.0000 mg | ORAL_TABLET | Freq: Once | ORAL | Status: AC
  Administered 2020-02-04: 50 mg via ORAL
  Filled 2020-02-04: qty 1

## 2020-02-04 MED ORDER — ACETAMINOPHEN 325 MG PO TABS: 650.0000 mg | ORAL_TABLET | ORAL | Status: DC | PRN

## 2020-02-04 MED ORDER — HEPARIN (PORCINE) 25000 UT/250ML-% IV SOLN
1250.0000 [IU]/h | INTRAVENOUS | Status: DC
  Administered 2020-02-04: 1250 [IU]/h via INTRAVENOUS
  Filled 2020-02-04: qty 250

## 2020-02-04 MED ORDER — SODIUM CHLORIDE 0.9% FLUSH
3.0000 mL | Freq: Two times a day (BID) | INTRAVENOUS | Status: DC
  Administered 2020-02-04: 3 mL via INTRAVENOUS

## 2020-02-04 MED ORDER — ALLOPURINOL 100 MG PO TABS
100.0000 mg | ORAL_TABLET | Freq: Every day | ORAL | Status: DC
  Administered 2020-02-04 – 2020-02-07 (×3): 100 mg via ORAL
  Filled 2020-02-04 (×4): qty 1

## 2020-02-04 MED ORDER — ROSUVASTATIN CALCIUM 5 MG PO TABS
10.0000 mg | ORAL_TABLET | Freq: Every day | ORAL | Status: DC
  Administered 2020-02-04 – 2020-02-07 (×3): 10 mg via ORAL
  Filled 2020-02-04 (×4): qty 2

## 2020-02-04 MED ORDER — FUROSEMIDE 10 MG/ML IJ SOLN
60.0000 mg | Freq: Once | INTRAMUSCULAR | Status: AC
  Administered 2020-02-04: 60 mg via INTRAVENOUS
  Filled 2020-02-04: qty 6

## 2020-02-04 MED ORDER — BISOPROLOL FUMARATE 5 MG PO TABS
5.0000 mg | ORAL_TABLET | ORAL | Status: DC
  Administered 2020-02-04 – 2020-02-07 (×2): 5 mg via ORAL
  Filled 2020-02-04 (×2): qty 1

## 2020-02-04 MED ORDER — HYDROCODONE-ACETAMINOPHEN 5-325 MG PO TABS: 1.0000 | ORAL_TABLET | Freq: Four times a day (QID) | ORAL | Status: DC | PRN

## 2020-02-04 MED ORDER — ASPIRIN 81 MG PO CHEW
81.0000 mg | CHEWABLE_TABLET | Freq: Every day | ORAL | Status: DC
  Administered 2020-02-04: 81 mg via ORAL
  Filled 2020-02-04: qty 1

## 2020-02-04 MED ORDER — CALCITRIOL 0.25 MCG PO CAPS: 0.2500 ug | ORAL_CAPSULE | ORAL | Status: DC

## 2020-02-04 MED ORDER — MIDODRINE HCL 5 MG PO TABS
15.0000 mg | ORAL_TABLET | ORAL | Status: DC
  Filled 2020-02-04: qty 3

## 2020-02-04 MED ORDER — ALUM & MAG HYDROXIDE-SIMETH 200-200-20 MG/5ML PO SUSP: 30.0000 mL | Freq: Four times a day (QID) | ORAL | Status: DC | PRN

## 2020-02-04 MED ORDER — DIPHENHYDRAMINE HCL 25 MG PO CAPS
25.0000 mg | ORAL_CAPSULE | Freq: Three times a day (TID) | ORAL | Status: DC | PRN
  Administered 2020-02-04: 25 mg via ORAL
  Filled 2020-02-04: qty 1

## 2020-02-04 MED ORDER — ONDANSETRON HCL 4 MG/2ML IJ SOLN: 4.0000 mg | Freq: Four times a day (QID) | INTRAMUSCULAR | Status: DC | PRN

## 2020-02-04 MED ORDER — MIDODRINE HCL 5 MG PO TABS: 10.0000 mg | ORAL_TABLET | ORAL | Status: DC

## 2020-02-04 MED ORDER — CINACALCET HCL 30 MG PO TABS
30.0000 mg | ORAL_TABLET | Freq: Every day | ORAL | Status: DC
  Administered 2020-02-04 – 2020-02-06 (×3): 30 mg via ORAL
  Filled 2020-02-04 (×5): qty 1

## 2020-02-04 MED ORDER — HEPARIN SODIUM (PORCINE) 5000 UNIT/ML IJ SOLN: 5000.0000 [IU] | Freq: Three times a day (TID) | INTRAMUSCULAR | Status: DC

## 2020-02-04 MED ORDER — METHOCARBAMOL 500 MG PO TABS: 500.0000 mg | ORAL_TABLET | Freq: Three times a day (TID) | ORAL | Status: DC | PRN

## 2020-02-04 MED ORDER — RENA-VITE PO TABS
1.0000 | ORAL_TABLET | Freq: Every day | ORAL | Status: DC
  Administered 2020-02-04 – 2020-02-06 (×4): 1 via ORAL
  Filled 2020-02-04 (×5): qty 1

## 2020-02-04 MED ORDER — APIXABAN 5 MG PO TABS
5.0000 mg | ORAL_TABLET | Freq: Two times a day (BID) | ORAL | Status: DC
  Administered 2020-02-04 (×2): 5 mg via ORAL
  Filled 2020-02-04 (×3): qty 1

## 2020-02-04 NOTE — Progress Notes (Signed)
PROGRESS NOTE    Patient: Alex Williams                            PCP: [name], MD                    DOB: 1951-01-11            DOA: 02/03/2020 OEU:[id number]             DOS: 02/04/2020, 12:49 PM   LOS: 0 days   Date of Service: The patient was seen and examined on 02/04/2020  Subjective:  The patient was seen and examined this morning.  Awake alert following command, cooperative.  Reporting shortness of breath, denies any chest pain at this time.  Reporting that he has missed his dialysis, he is progressively feeling more shortness of breath, noted weight gain. Denies of having a fever chills nausea vomiting.   Brief Narrative:   Alex Williams 69 year old male with past medical history of end-stage renal disease(HD MWF), hypertension, hyperlipidemia, coronary artery disease (S/P STEMI and CABG 07/5398), systolic congestive heart failure( EF 35% via TEE 09/2019) chronic atrial fibrillation (on eliquis), obstructive sleep apnea who presents to ED with complaints of dyspnea on exertion and chest discomfort.   Assessment & Plan:   Principal Problem:   Chest pain Active Problems:   Essential hypertension   Coronary artery disease   ESRD on dialysis [facility])   Atrial fibrillation, chronic (HCC)   GERD (gastroesophageal reflux disease)   Acute on chronic systolic CHF (congestive heart failure) (HCC)   Non-STEMI -versus ischemic demand Chest pain-with a history of coronary artery disease -Possible non-STEMI versus severe ischemic demand, in the setting of volume overload, hemodialysis, end-stage renal disease -Cardiology consulted -appreciate evaluation and recommendation -Troponin elevated 108, 119, >>> 452 -Currently denying of having any chest pain -Continue cardiac monitoring- -Continue supportive measures: On Eliquis, continue beta-blockers, statins, as needed nitroglycerin, morphine, -N.p.o. till further evaluation by cardiology    Pulmonary embolism unlikely  due to patient's ongoing Eliquis use  CT chest reveals small bilateral pleural effusions with no evidence of pneumonia or fracture.  Active Problems:  Acute on chronic systolic CHF (congestive heart failure) ([place]) -Noted for volume overload-CHF exacerbation -Continue complain of dyspnea, worsening with exertion -Paroxysmal nocturnal dyspnea with report of orthopnea -Likely will need further volume extraction to hemodialysis -Patient still able to void, Lasix IV was initiated overnight    Known significant reduction in ejection fraction of 30 to 35% via TEE in March 2021. Marland Kitchen  Chest x-ray and CT scan reveal no evidence of pneumonia  Pulmonary embolism unlikely considering ongoing Eliquis use    Essential hypertension -Resuming home medication, -Monitoring closely        ESRD on dialysis ([place]) -Dyspnea, bilateral lower extremity edema -Volume overload -anticipating volume extraction 3 hemodialysis -Nephrology consulted, anticipating hemodialysis today and tomorrow     Atrial fibrillation, chronic (HCC)   Continue home regimen of amiodarone  Continue home regimen of beta-blocker  Continue home regimen of Eliquis  Monitoring, currently rate well controlled  Cardiology consulted appreciate input   Anemia -anemia of chronic disease -Monitoring H&H closely - hgb 9.2 today  OSA - reports full compliance w/ home CPAP - order CPAP qhs while inpatient   Morbid Obesity  - Body mass index is 39.47 kg/m.  -Patient was advised    Aortic Stenosis/severe MR - TEE 3/21: moderately calcified and  trileaflet. Mild AS with mean gradient 18 mmHg and AVA 1.7 cm^2. No AI  -Cardiology following -Continue current meds  Consultants:  Cardiology Nephrology PT/OT   --------------------------------------------------------------------------------------------------------------------------------- DVT prophylaxis:  [id number] Code Status:   Code Status: Full  Code Family Communication: No family member present at bedside- attempt will be made to update daily The above findings and plan of care has been discussed with patient   in detail,  they expressed understanding and agreement of above. -Advance care planning has been discussed.   Admission status:    Status is: Will be switched to inpatient-patient meets the inpatient criteria due to non-STEMI, volume overload needing hemodialysis, cardiac evaluation    Dispo: The patient is from: Home              Anticipated d/c is to: Home              Anticipated d/c date is: 2 days              Patient currently is not medically stable to d/c.        Procedures:   No admission procedures for hospital encounter.     Antimicrobials:  Anti-infectives (From admission, onward)   None       Medication:  . allopurinol  100 mg Oral Daily  . amiodarone  200 mg Oral Daily  . apixaban  5 mg Oral BID  . aspirin  81 mg Oral Daily  . bisoprolol  5 mg Oral Once per day on Sun Tue Thu Sat  . [START ON 02/05/2020] calcitRIOL  0.25 mcg Oral Q M,W,F-HD  . Chlorhexidine Gluconate Cloth  6 each Topical Q0600  . cinacalcet  30 mg Oral Q supper  . ferric citrate  420 mg Oral TID WC  . [START ON 02/05/2020] midodrine  10 mg Oral Q M,W,F-HD  . multivitamin  1 tablet Oral QHS  . rosuvastatin  10 mg Oral Daily  . sodium chloride flush  3 mL Intravenous Once    acetaminophen, alum & mag hydroxide-simeth, diphenhydrAMINE, fluticasone, HYDROcodone-acetaminophen, methocarbamol, nitroGLYCERIN, ondansetron (ZOFRAN) IV   Objective:   Vitals:   02/04/20 1102 02/04/20 1211 02/04/20 1216 02/04/20 1230  BP: (!) 106/58 (!) 95/54 (!) 108/44 110/62  Pulse: 60 61 (!) 56 61  Resp: 15 16    Temp:  97.6 F (36.4 C)    TempSrc:  Oral    SpO2: 100% 95%    Weight:      Height:        Intake/Output Summary (Last 24 hours) at 02/04/2020 1249 Last data filed at 02/04/2020 0720 Gross per 24 hour  Intake --   Output 125 ml  Net -125 ml   Filed Weights   02/03/20 1617  Weight: 132 kg     Examination:   Physical Exam  Constitution:  Alert, cooperative, no distress,  Appears calm and comfortable  Psychiatric: Normal and stable mood and affect, cognition intact,   HEENT: Normocephalic, PERRL, otherwise with in Normal limits  Chest:Chest symmetric Cardio vascular:  S1/S2, RRR, No murmure, No Rubs or Gallops  pulmonary: Clear to auscultation bilaterally, respirations unlabored, negative wheezes / crackles Abdomen: Soft, non-tender, non-distended, bowel sounds,no masses, no organomegaly Muscular skeletal: Limited exam - in bed, able to move all 4 extremities, Normal strength,  Neuro: CNII-XII intact. , normal motor and sensation, reflexes intact  Extremities: +3 pitting edema lower extremities, +2 pulses  Skin: Dry, warm to touch, negative for any Rashes, No open wounds Wounds:  per nursing documentation    ------------------------------------------------------------------------------------------------------------------------------------------    LABs:  CBC Latest Ref Rng & Units 02/04/2020 02/03/2020 01/28/2020  WBC 4.0 - 10.5 K/uL 11.1(H) 13.5(H) 9.7  Hemoglobin 13.0 - 17.0 g/dL 9.2(L) 9.8(L) 12.0(L)  Hematocrit 39 - 52 % 30.5(L) 32.9(L) 39.5  Platelets 150 - 400 K/uL 174 194 217   CMP Latest Ref Rng & Units 02/04/2020 02/03/2020 01/28/2020  Glucose 70 - 99 mg/dL 77 98 100(H)  BUN 8 - 23 mg/dL 69(H) 61(H) 35(H)  Creatinine 0.61 - 1.24 mg/dL 9.59(H) 8.99(H) 8.28(H)  Sodium 135 - 145 mmol/L 140 139 139  Potassium 3.5 - 5.1 mmol/L 4.3 4.4 4.7  Chloride 98 - 111 mmol/L 98 96(L) 96(L)  CO2 22 - 32 mmol/L 24 26 29   Calcium 8.9 - 10.3 mg/dL 11.1(H) 11.5(H) 11.6(H)  Total Protein 6.5 - 8.1 g/dL 5.7(L) - -  Total Bilirubin 0.3 - 1.2 mg/dL 1.4(H) - -  Alkaline Phos 38 - 126 U/L 77 - -  AST 15 - 41 U/L 27 - -  ALT 0 - 44 U/L 9 - -       Micro Results Recent Results (from the past 240  hour(s))  SARS Coronavirus 2 by RT PCR (hospital order, performed in [facility] hospital lab) Nasopharyngeal Nasopharyngeal Swab     Status: None   Collection Time: 02/03/20 11:27 PM   Specimen: Nasopharyngeal Swab  Result Value Ref Range Status   SARS Coronavirus 2 NEGATIVE NEGATIVE Final    Comment: (NOTE) SARS-CoV-2 target nucleic acids are NOT DETECTED.  [facility], [place] [address] [place], [place] [address]     Radiology Reports DG Chest 2 View  Result Date: 02/03/2020 CLINICAL DATA:  Dyspnea on exertion for 3 months, previous tobacco abuse EXAM: CHEST - 2 VIEW COMPARISON:  12/28/2019 FINDINGS: Frontal and lateral views of the chest demonstrates stable enlargement of the cardiac silhouette. Postsurgical changes from prior bypass surgery. There is chronic central vascular congestion. Retrocardiac consolidation may reflect atelectasis. No effusion or pneumothorax. No acute bony abnormalities. IMPRESSION: 1. Chronic central vascular congestion. 2. Retrocardiac consolidation favor atelectasis. 3. Stable cardiomegaly. Electronically Signed   By: Randa Ngo M.D.   On: 02/03/2020 16:56   CT Chest Wo Contrast  Result Date: 02/04/2020 CLINICAL DATA:  69 year old male with increasing shortness of breath. Missed dialysis. Retrocardiac opacity on chest radiographs. EXAM: CT CHEST WITHOUT CONTRAST TECHNIQUE: Multidetector CT imaging of the chest was performed following the standard protocol without IV contrast. COMPARISON:  Chest radiographs earlier tonight. Chest CT 01/28/2010. FINDINGS: Cardiovascular: Calcified aortic atherosclerosis. Prior CABG. Cardiomegaly is increased since 2011. Small volume pericardial effusion with simple fluid density (series 3, image 106). Vascular patency is not evaluated in the absence of IV contrast. Mediastinum/Nodes: Negative.  No mediastinal lymphadenopathy. Lungs/Pleura: Small bilateral layering pleural effusions. Simple fluid density  favoring transudate. Fluid mildly tracks into the left major fissure. Associated compressive atelectasis. Major airways are patent. Occasional superimposed faint upper lobe subpleural densities which are probably mild scarring. Upper Abdomen: Cholelithiasis. No pericholecystic inflammation is evident. Negative visible noncontrast liver, spleen, pancreas, and bowel in the upper abdomen. There is new low-density left adrenal gland thickening since 2011 which could be adrenal hyperplasia or benign adenoma. Musculoskeletal: Prior sternotomy. 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 No acute osseous abnormality identified. IMPRESSION: 1. Small layering pleural effusions with compressive atelectasis. 2. Cardiomegaly with small volume pericardial effusion. Prior CABG. Aortic Atherosclerosis (ICD10-I70.0). 3.  Cholelithiasis. Electronically Signed   By: Genevie Ann M.D.   On: 02/04/2020 00:50   VAS [place] LOWER EXTREMITY VENOUS (DVT) (ONLY [facility] & [facility])  Result Date: 01/21/2020  Lower Venous DVTStudy Indications: Worsening erythema and pain on thigh, erythema of calf x 1 month.  Other Factors: Cellulitis on antibiotics, Hx GSV harvest. Limitations: Body habitus and patient pain level. Comparison Study: 12/28/19 negative Performing Technologist: June Leap RDMS, RVT  Examination Guidelines: A complete evaluation includes B-mode imaging, spectral Doppler, color Doppler, and power Doppler as needed of all accessible portions of each vessel. Bilateral testing is considered an integral part of a complete examination. Limited examinations for reoccurring indications may be performed as noted. The reflux portion of the exam is performed with the patient in reverse Trendelenburg. Summary: RIGHT: - There is no evidence of deep vein thrombosis in the lower extremity.  - No cystic structure found in the popliteal fossa.   *See table(s) above for measurements and observations.  Electronically signed by Servando Snare MD on 01/21/2020 at 4:32:00 PM.    Final    SIGNED: Deatra James, MD, FACP, FHM. [facility],  Pager (please use [email or web address] to [email or web address])  If 7PM-7AM, please contact night-coverage [email or web address] [facility] 02/04/2020, 12:49 PM

## 2020-02-04 NOTE — Progress Notes (Addendum)
[facility] Progress Note  Subjective: Pt dc'd 1 week ago after admit for recurrent RLE cellulitis, also afib/ esrd on HD, hx cabg, HL.  Presents now /w DOE. Seen in ED, states he thinks he is losing wt.  Only had 1-1.5 kg on last HD session.  No resp distress. Cannot walk across the room d/t SOB.   Vitals:   02/04/20 0200 02/04/20 0902 02/04/20 1002 02/04/20 1102  BP: 104/65 104/65 115/68 (!) 106/58  Pulse: 89 61 64 60  Resp: 18 15 12 15   Temp:      TempSrc:      SpO2: 99% 100% 99% 100%  Weight:      Height:        Exam:  alert, nad, no distress  no jvd  Chest dec'd L base, R clear   Cor reg no RG  Abd soft ntnd no ascites   Ext 2+ bilat pitting pretib / pedal edema   Alert, NF, ox3   RUE AVF+bruit   home meds:  - allopurinol 100/ auryxia 2 ac tid / protonix 40/ sensipar 30 qd  - crestor 10/ eliquis 5 bid/ midodrine 10 per HD mwf/ bisoprolol 5mg  hs nonHD days/ asa 81 mg/ amiodarone 200qd  - gabapentin 100 hs mwf/ vicodin qid prn/ robaxin prn  - prn's/ vitamins/ supplements     OP HD: [facility]    4.5h 450/800 133kg 2/2 bath P4 AVF Hep 2000  mircera 75ug q2 wks   Assessment/ Plan: 1. DOE/ edema - suspect vol overload, pt may be losing body wt. CXR w/ early IS edema pattern. Hx CM EF 35%. BP's on the low side. Plan extra HD today and HD tomorrow to challenge / lower the dry wt and get vol down and see if this helps.  2. ESRD - on HD MWF.  HD today and tomorrow. Midodrine 10 pre hd 3. Anemia ckd - cont meds 4. MBD ckd - Ca++ is high at 11, calcitriol on hold, getting sensipar. 2 phos binders.  5. CAD sp CABG 6. Atrial fib - on BB/ eliquis/ amio  7. HL - statin     Kelly Splinter 02/04/2020, 11:57 AM   Recent Labs  Lab 02/03/20 1644 02/04/20 0405  K 4.4 4.3  BUN 61* 69*  CREATININE 8.99* 9.59*  CALCIUM 11.5* 11.1*  HGB 9.8* 9.2*   Inpatient medications: . allopurinol  100 mg Oral Daily  . amiodarone  200 mg Oral Daily  . apixaban  5  mg Oral BID  . aspirin  81 mg Oral Daily  . bisoprolol  5 mg Oral Once per day on Sun Tue Thu Sat  . [START ON 02/05/2020] calcitRIOL  0.25 mcg Oral Q M,W,F-HD  . Chlorhexidine Gluconate Cloth  6 each Topical Q0600  . ferric citrate  420 mg Oral TID WC  . [START ON 02/05/2020] midodrine  10 mg Oral Q M,W,F-HD  . multivitamin  1 tablet Oral QHS  . rosuvastatin  10 mg Oral Daily  . sodium chloride flush  3 mL Intravenous Once    acetaminophen, alum & mag hydroxide-simeth, diphenhydrAMINE, fluticasone, HYDROcodone-acetaminophen, methocarbamol, nitroGLYCERIN, ondansetron (ZOFRAN) IV

## 2020-02-04 NOTE — H&P (Addendum)
History and Physical    Alex Williams XKG:[id number] DOB: 05/18/1951 DOA: 02/03/2020  PCP: [name], MD  Patient coming from: Home   Chief Complaint:  Chief Complaint  Patient presents with  . Shortness of Breath     HPI:    69 year old male with past medical history of end-stage renal disease(HD MWF), hypertension, hyperlipidemia, coronary artery disease (S/P STEMI and CABG 01/262), systolic congestive heart failure( EF 35% via TEE 09/2019) chronic atrial fibrillation (on eliquis), obstructive sleep apnea who presents to [facility] emergency department with complaints of dyspnea on exertion and chest discomfort.  Patient explains that approximately 3 weeks ago he began to experience shortness of breath.  The shortness of breath was initially mild in intensity but progressively became more more severe over the next several weeks.  Shortness of breath occurs with exertion and improves with rest.  Shortness of breath has been occurring with less and less exertion as symptoms have progressed. Patient also complaining of paroxysmal nocturnal dyspnea and pillow orthopnea.   Patient denies weight gain, increasing abdominal girth or significantly worsening bilateral lower extremity edema.  As the patient symptoms have worsened, the patient is also beginning to experience intermittent chest discomfort.  Patient describes chest discomfort as midsternal in location, tight in quality, mild in intensity, sometimes occurring with exertion and sometimes not.  Patient is unable to identify any alleviating or exacerbating factors.  Patient's progressively worsening symptoms the patient eventually presented to [facility] emergency department for evaluation.  Upon evaluation in the emergency department, due to patient's ongoing complaints of mild chest discomfort the hospitalist group was called to assess the patient for admission the hospital.   Review of Systems: A 10-system review  of systems has been performed and all systems are negative with the exception of what is listed in the HPI.    Past Medical History:  Diagnosis Date  . Cervical disc disease   . Chronic kidney disease   . COLONIC POLYPS, HX OF 06/27/2007  . EXOGENOUS OBESITY 01/30/2010  . GERD (gastroesophageal reflux disease)    PMH  . GOUT 01/30/2010  . HYPERCHOLESTEROLEMIA 06/30/2007  . HYPERTENSION 06/27/2007  . LOW BACK PAIN 06/27/2007  . NEPHROLITHIASIS, HX OF 06/27/2007  . OSTEOARTHRITIS 06/27/2007  . SLEEP APNEA, OBSTRUCTIVE, MODERATE 01/27/2009  . Wears dentures    upper    Past Surgical History:  Procedure Laterality Date  . AV FISTULA PLACEMENT Right 01/19/2019   Procedure: RIGHT BRACHIOCEPHALIC ARTERIOVENOUS (AV) FISTULA CREATION;  Surgeon: Angelia Mould, MD;  Location: [place];  Service: Vascular;  Laterality: Right;  . CARDIAC CATHETERIZATION    . CARPAL TUNNEL RELEASE     left hand  . COLONOSCOPY     with polypectomy  . CORONARY ARTERY BYPASS GRAFT N/A 04/16/2019   Procedure: CORONARY ARTERY BYPASS GRAFTING (CABG)X3  , WITH ENDOSCOPIC HARVESTING OF RIGHT GREATER SAPHENOUS VEIN;  Surgeon: Lajuana Matte, MD;  Location: [place];  Service: Open Heart Surgery;  Laterality: N/A;  . CORONARY/GRAFT ACUTE MI REVASCULARIZATION N/A 04/16/2019   Procedure: CORONARY/GRAFT ACUTE MI REVASCULARIZATION;  Surgeon: Burnell Blanks, MD;  Location: [place] [facility];  Service: Cardiovascular;  Laterality: N/A;  . INSERTION OF DIALYSIS CATHETER N/A 04/29/2019   Procedure: INSERTION OF TUNNELED DIALYSIS CATHETER, right internal jugular;  Surgeon: Rosetta Posner, MD;  Location: [place];  Service: Vascular;  Laterality: N/A;  . IR US GUIDE BX ASP/DRAIN  12/29/2019  . JOINT REPLACEMENT Left 2005  knee  . LUMBAR LAMINECTOMY    . MULTIPLE TOOTH EXTRACTIONS    . PERIPHERAL VASCULAR BALLOON ANGIOPLASTY Right 06/25/2019   Procedure: PERIPHERAL VASCULAR BALLOON ANGIOPLASTY;  Surgeon: Marty Heck,  MD;  Location: [place] [facility];  Service: Cardiovascular;  Laterality: Right;  . RADIOFREQUENCY ABLATION KIDNEY    . RIGHT/LEFT HEART CATH AND CORONARY ANGIOGRAPHY N/A 04/16/2019   Procedure: RIGHT/LEFT HEART CATH AND CORONARY ANGIOGRAPHY;  Surgeon: Burnell Blanks, MD;  Location: [place] [facility];  Service: Cardiovascular;  Laterality: N/A;  . TEE WITHOUT CARDIOVERSION N/A 04/16/2019   Procedure: TRANSESOPHAGEAL ECHOCARDIOGRAM (TEE);  Surgeon: Lajuana Matte, MD;  Location: [place];  Service: Open Heart Surgery;  Laterality: N/A;  . TEE WITHOUT CARDIOVERSION N/A 10/08/2019   Procedure: TRANSESOPHAGEAL ECHOCARDIOGRAM (TEE);  Surgeon: Larey Dresser, MD;  Location: [facility] ENDOSCOPY;  Service: Cardiovascular;  Laterality: N/A;  . TOTAL KNEE ARTHROPLASTY     left  . VENTRICULAR ASSIST DEVICE INSERTION N/A 04/16/2019   Procedure: VENTRICULAR ASSIST DEVICE INSERTION;  Surgeon: Burnell Blanks, MD;  Location: [place] [facility];  Service: Cardiovascular;  Laterality: N/A;     reports that he quit smoking about 39 years ago. He has never used smokeless tobacco. He reports current alcohol use of about 1.0 standard drink of alcohol per week. He reports that he does not use drugs.  Allergies  Allergen Reactions  . Codeine Phosphate Other (See Comments)    Hyperactive     Family History  Problem Relation Age of Onset  . Hypertension Other   . Diabetes Sister   . Hyperlipidemia Sister   . Sleep apnea Sister      Prior to Admission medications   Medication Sig Start Date End Date Taking? Authorizing Provider  acetaminophen (TYLENOL) 500 MG tablet Take 1,000 mg by mouth daily as needed for mild pain.   Yes [provider]  allopurinol (ZYLOPRIM) 100 MG tablet Take 1 tablet (100 mg total) by mouth daily. 05/08/19  Yes Angiulli, Lavon Paganini, PA-C  amiodarone (PACERONE) 200 MG tablet TAKE 1 TABLET BY MOUTH EVERY DAY Patient taking differently: Take 200 mg by mouth daily.   01/26/20  Yes Larey Dresser, MD  aspirin EC 81 MG EC tablet Take 1 tablet (81 mg total) by mouth daily. 05/01/19  Yes Conte, Tessa N, PA-C  bisoprolol (ZEBETA) 5 MG tablet TAKE 1 TABLET AS DIRECTED ON NON DIALYSIS TUES,THURS,SAT,SUN NEEDS APPT Patient taking differently: Take 5 mg by mouth as directed. Take 1 tablet as directed on non dialysis (Tues, Thurs, Sat, Sun) needs appt 11/26/19  Yes Larey Dresser, MD  calcitRIOL (ROCALTROL) 0.25 MCG capsule Take 1 capsule (0.25 mcg total) by mouth every Monday, Wednesday, and Friday with hemodialysis. 12/30/19  Yes Seawell, Jaimie A, DO  cetirizine (ZYRTEC) 10 MG tablet Take 10 mg by mouth daily as needed for allergies.   Yes [provider]  diphenhydramine-acetaminophen (TYLENOL PM) 25-500 MG TABS tablet Take 1 tablet by mouth at bedtime as needed (sleep).    Yes [provider]  ELIQUIS 5 MG TABS tablet TAKE 1 TABLET BY MOUTH TWICE A DAY Patient taking differently: Take 5 mg by mouth 2 (two) times daily.  10/21/19  Yes Larey Dresser, MD  ferric citrate (AURYXIA) 1 GM 210 MG(Fe) tablet Take 420 mg by mouth 3 (three) times daily with meals.    Yes [provider]  fluticasone (FLONASE) 50 MCG/ACT nasal spray Place 1 spray into both nostrils  daily as needed for allergies or rhinitis.   Yes [provider]  gabapentin (NEURONTIN) 100 MG capsule TAKE 1 CAPSULE AT BEDTIME ON HD DAYS (M,W,F) Patient taking differently: Take 100 mg by mouth every Monday, Wednesday, and Friday. Take on dialysis days 12/17/19  Yes [name], MD  HYDROcodone-acetaminophen (NORCO/VICODIN) 5-325 MG tablet Take 1 tablet by mouth every 6 (six) hours as needed for moderate pain. 01/01/20  Yes [name], MD  methocarbamol (ROBAXIN) 500 MG tablet Take 1 tablet (500 mg total) by mouth every 8 (eight) hours as needed for muscle spasms. 01/01/20  Yes [name], MD  midodrine (PROAMATINE) 10 MG tablet Take 1 tablet (10 mg total) by mouth  every Monday, Wednesday, and Friday with hemodialysis. 05/08/19  Yes Angiulli, Lavon Paganini, PA-C  Multiple Vitamins-Minerals (CENTRUM SILVER 50+MEN) TABS Take 1 tablet by mouth daily.   Yes [provider]  multivitamin (RENA-VIT) TABS tablet Take 1 tablet by mouth at bedtime. 05/07/19  Yes Angiulli, Lavon Paganini, PA-C  ondansetron (ZOFRAN-ODT) 8 MG disintegrating tablet Take 1 tablet (8 mg total) by mouth every 8 (eight) hours as needed for nausea or vomiting. 10/20/19  Yes [name], MD  promethazine (PHENERGAN) 12.5 MG tablet Take 1 tablet (12.5 mg total) by mouth every 8 (eight) hours as needed for nausea or vomiting. 01/01/20  Yes [name], MD  rosuvastatin (CRESTOR) 10 MG tablet Take 1 tablet (10 mg total) by mouth daily. 09/29/19 02/04/20 Yes Larey Dresser, MD  cinacalcet (SENSIPAR) 30 MG tablet Take 1 tablet (30 mg total) by mouth daily with supper. 01/21/20   Posey Boyer, MD  pantoprazole (PROTONIX) 40 MG tablet Take 1 tablet (40 mg total) by mouth daily as needed (acid reflux). 01/21/20   Posey Boyer, MD    Physical Exam: Vitals:   02/03/20 2039 02/03/20 2303 02/03/20 2315 02/03/20 2329  BP: 114/67  123/84   Pulse: 63 94 92   Resp: 20 17 16    Temp: 98 F (36.7 C)   98.6 F (37 C)  TempSrc: Oral   Rectal  SpO2: 100% 97% 99%   Weight:      Height:        Constitutional: Acute alert and oriented x3, no associated distress.   Skin: no rashes, no lesions, good skin turgor noted. Eyes: Pupils are equally reactive to light.  No evidence of scleral icterus or conjunctival pallor.  ENMT: Moist mucous membranes noted.  Posterior pharynx clear of any exudate or lesions.   Neck: normal, supple, no masses, no thyromegaly.  No evidence of jugular venous distension.   Respiratory: clear to auscultation bilaterally, no wheezing, mild bibasilar rales noted. Normal respiratory effort. No accessory muscle use.  Cardiovascular: Regular rate and rhythm, no murmurs / rubs /  gallops.  Mild distal bilateral lower extremity pitting edema.. 2+ pedal pulses. No carotid bruits.  Chest:   Notable midsternal reproducible chest tenderness.  No evidence of crepitus or deformity.     Back:   Nontender without crepitus or deformity. Abdomen: Abdomen is protuberant but soft and nontender.  No evidence of intra-abdominal masses.  Positive bowel sounds noted in all quadrants.   Musculoskeletal: No joint deformity upper and lower extremities. Good ROM, no contractures. Normal muscle tone.  Neurologic: CN 2-12 grossly intact. Sensation intact, strength noted to be 5 out of 5 in all 4 extremities.  Patient is following all commands.  Patient is responsive to verbal stimuli.  Psychiatric: Patient presents as a normal mood with appropriate affect.  Patient seems to possess insight as to theircurrent situation.     Labs on Admission: I have personally reviewed following labs and imaging studies -   CBC: Recent Labs  Lab 01/28/20 0310 02/03/20 1644  WBC 9.7 13.5*  HGB 12.0* 9.8*  HCT 39.5 32.9*  MCV 89.0 92.2  PLT 217 094   Basic Metabolic Panel: Recent Labs  Lab 01/28/20 0310 02/03/20 1644  NA 139 139  K 4.7 4.4  CL 96* 96*  CO2 29 26  GLUCOSE 100* 98  BUN 35* 61*  CREATININE 8.28* 8.99*  CALCIUM 11.6* 11.5*   GFR: Estimated Creatinine Clearance: 10.9 mL/min (A) (by C-G formula based on SCr of 8.99 mg/dL (H)). Liver Function Tests: No results for input(s): AST, ALT, ALKPHOS, BILITOT, PROT, ALBUMIN in the last 168 hours. No results for input(s): LIPASE, AMYLASE in the last 168 hours. No results for input(s): AMMONIA in the last 168 hours. Coagulation Profile: No results for input(s): INR, PROTIME in the last 168 hours. Cardiac Enzymes: No results for input(s): CKTOTAL, CKMB, CKMBINDEX, TROPONINI in the last 168 hours. BNP (last 3 results) No results for input(s): PROBNP in the last 8760 hours. HbA1C: No results for input(s): HGBA1C in the last 72  hours. CBG: No results for input(s): GLUCAP in the last 168 hours. Lipid Profile: No results for input(s): CHOL, HDL, LDLCALC, TRIG, CHOLHDL, LDLDIRECT in the last 72 hours. Thyroid Function Tests: No results for input(s): TSH, T4TOTAL, FREET4, T3FREE, THYROIDAB in the last 72 hours. Anemia Panel: No results for input(s): VITAMINB12, FOLATE, FERRITIN, TIBC, IRON, RETICCTPCT in the last 72 hours. Urine analysis:    Component Value Date/Time   COLORURINE DK. YELLOW 01/20/2009 0918   APPEARANCEUR Clear 08/09/2017 1116   LABSPEC >=1.030 01/20/2009 0918   PHURINE 5.0 01/20/2009 0918   GLUCOSEU Negative 08/09/2017 1116   GLUCOSEU NEGATIVE 01/20/2009 0918   BILIRUBINUR Negative 08/09/2017 1116   KETONESUR negative 12/01/2016 0822   KETONESUR TRACE 01/20/2009 0918   PROTEINUR 3+ (A) 08/09/2017 1116   UROBILINOGEN 0.2 12/01/2016 0822   UROBILINOGEN 0.2 01/20/2009 0918   NITRITE Negative 08/09/2017 1116   NITRITE NEGATIVE 01/20/2009 0918   LEUKOCYTESUR Negative 08/09/2017 1116    Radiological Exams on Admission - Personally Reviewed: DG Chest 2 View  Result Date: 02/03/2020 CLINICAL DATA:  Dyspnea on exertion for 3 months, previous tobacco abuse EXAM: CHEST - 2 VIEW COMPARISON:  12/28/2019 FINDINGS: Frontal and lateral views of the chest demonstrates stable enlargement of the cardiac silhouette. Postsurgical changes from prior bypass surgery. There is chronic central vascular congestion. Retrocardiac consolidation may reflect atelectasis. No effusion or pneumothorax. No acute bony abnormalities. IMPRESSION: 1. Chronic central vascular congestion. 2. Retrocardiac consolidation favor atelectasis. 3. Stable cardiomegaly. Electronically Signed   By: Randa Ngo M.D.   On: 02/03/2020 16:56   CT Chest Wo Contrast  Result Date: 02/04/2020 CLINICAL DATA:  69 year old male with increasing shortness of breath. Missed dialysis. Retrocardiac opacity on chest radiographs. EXAM: CT CHEST WITHOUT  CONTRAST TECHNIQUE: Multidetector CT imaging of the chest was performed following the standard protocol without IV contrast. COMPARISON:  Chest radiographs earlier tonight. Chest CT 01/28/2010. FINDINGS: Cardiovascular: Calcified aortic atherosclerosis. Prior CABG. Cardiomegaly is increased since 2011. Small volume pericardial effusion with simple fluid density (series 3, image 106). Vascular patency is not evaluated in the absence of IV contrast. Mediastinum/Nodes: Negative.  No mediastinal lymphadenopathy. Lungs/Pleura: Small bilateral layering pleural effusions. Simple fluid  density favoring transudate. Fluid mildly tracks into the left major fissure. Associated compressive atelectasis. Major airways are patent. Occasional superimposed faint upper lobe subpleural densities which are probably mild scarring. Upper Abdomen: Cholelithiasis. No pericholecystic inflammation is evident. Negative visible noncontrast liver, spleen, pancreas, and bowel in the upper abdomen. There is new low-density left adrenal gland thickening since 2011 which could be adrenal hyperplasia or benign adenoma. Musculoskeletal: Prior sternotomy. 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 No acute osseous abnormality identified. IMPRESSION: 1. Small layering pleural effusions with compressive atelectasis. 2. Cardiomegaly with small volume pericardial effusion. Prior CABG. Aortic Atherosclerosis (ICD10-I70.0). 3. Cholelithiasis. Electronically Signed   By: Genevie Ann M.D.   On: 02/04/2020 00:50    EKG: Personally reviewed.  Rhythm is normal sinus rhythm with first-degree block with heart rate of exceed 6 bpm.  No dynamic ST segment changes appreciated.  Assessment/Plan Principal Problem:   Chest pain   Patient presenting with chest discomfort both typical and atypical features  Patient has known substantial coronary artery disease status post STEMI and CABG in  03/2019  Monitoring patient on telemetry  Cycling cardiac enzymes -troponin is slightly elevated however this is expected in end-stage renal disease.  Continuing to trend to evaluate trajectory of elevation.  Making patient n.p.o. after midnight for cardiology evaluation in the morning for potential noninvasive ischemic assessment  Continuing home regimen of aspirin and Eliquis  Continue home regimen of beta-blocker therapy  As needed nitroglycerin for bouts of chest discomfort  Pulmonary embolism unlikely due to patient's ongoing Eliquis use  CT chest reveals small bilateral pleural effusions with no evidence of pneumonia or fracture.  Active Problems:   Acute on chronic systolic CHF (congestive heart failure) ([place])   With patient's complaints of dyspnea on exertion, paroxysmal nocturnal dyspnea and pillow orthopnea, it is possible that there is a component of acute congestive heart failure  Patient states that he still makes urine, will provide patient with a dose of 60 mg of IV Lasix  Known significant reduction in ejection fraction of 30 to 35% via TEE in March 2021.  We will formally consult nephrology in the morning to evaluate whether additional fluid removal can be undertaken.  Chest x-ray and CT scan reveal no evidence of pneumonia  Pulmonary embolism unlikely considering ongoing Eliquis use    Essential hypertension   Continue home regimen of antihypertensive therapy    Coronary artery disease   Known history of extensive coronary artery disease status post STEMI and CABG in 03/2019  Continue home regimen of statin, aspirin, beta-blocker  Chest pain work-up as noted above    ESRD on dialysis [facility])   Patient exhibiting dyspnea on exertion with bilateral lower extremity pitting edema  -volume overload, cardiogenic or noncardiogenic may be contributing.  Will ask nephrology to reevaluate patient in the morning for resumption of dialysis and possible removal  of excess fluid.    Atrial fibrillation, chronic (HCC)   Continue home regimen of amiodarone  Continue home regimen of beta-blocker  Continue home regimen of Eliquis  Monitoring patient on telemetry, currently rate controlled     Code Status:  Full code Family Communication: deferred   Status is: Observation  The patient remains OBS appropriate and will d/c before 2 midnights.  Dispo: The patient is from: Home              Anticipated d/c is to: Home  Anticipated d/c date is: 2 days              Patient currently is not medically stable to d/c.        Vernelle Emerald MD [facility] Pager [phone]  If 7PM-7AM, please contact night-coverage [email or web address] Use universal [place] password for that web site. If you do not have the password, please call the hospital operator.  02/04/2020, 1:11 AM

## 2020-02-04 NOTE — ED Notes (Signed)
Patient off floor to Dialysis.

## 2020-02-04 NOTE — Consult Note (Addendum)
Advanced Heart Failure Team Consult Note   Primary Physician: [name], MD PCP-Cardiologist:  Dr. Aundra Dubin   Reason for Consultation: acute on chronic combined systolic and diastolic heart failure   HPI:    Alex Williams is seen today for evaluation of acute on chronic combined systolic and diastolic heart failure at the request of Dr. Cyd Silence, Internal Medicine.   69 y.o.with history of ESRD (started on HD in 2020), gout, HTN, hyperlipidemia was admitted to [facility] 04/16/19 for chest pain and palpitations. On arrival to the ED, he was noted to be in atypical atrial flutter in the 130s-140s with acute anterior infarction. CODE STEMI activated.   He was brought to the cath lab and noted to be hypotensive with SBP in 80s. Norepinephrine was started and Impella was placed. LHC and RHC were done showing mean RA 15, PA 68/32 mean 45, CI 3.3. There was 90% distal left main stenosis into the LCx, totally occluded mid LAD after diagonal, 90% mild LCx, 50% ostial RCA, 99% ostial PDA. POBA to mid LAD but unable to restore flow.   It was suspected that the LAD was a chronic occlusion and the onset of atypical aflutter with RVR drove chest pain and ischemia (via severe distal left main stenosis). Hs-TnI returned at 1297. Echo showed EF 25% range with apical and peri-apical severe hypokinesis.   Patient was taken to the OR for urgent CABGx 3 by Dr. Kipp Brood. Unable to graft LAD, had LIMA-D2, SVG-OM2, and SVG-PDA. He required dobutamine and NE post op and was eventually weaned off and Impella removed. Afib/flutter treated w/ amiodarone and converted to NSR. Was placed on Eliquis for anticoagulation. Required CVVH for volume removal. Later transitioned to HD. Hypotension limited aggressive HF therapy. Was placed on midodrine to support BP during HD. Once stable, he was transferred to SNF for rehab.    Echo was repeated 09/24/19: EF 35-40% with moderate-severe eccentric MR and mild AS. GIIIDD w/  mild LVH. RV was normal. This was followed by TEE on 10/08/19 to better assess the mitral valve, which showed only moderate MR. RV function mildly reduced by TEE. LVEF 35%.   Admitted last month on 12/28/19 with RLE cellulitis. Placed on antibiotics. Had lower dopplers negative for DVT. Discharged on doxycycline.    Now presents to the ED w/ complaints of 3 week h/o progressive exertional dyspnea, orthopnea/ PND and occasional CP. Has had recent issues w/ nausea during dialysis leading to reduction in total volume removal during HD sessions. He has also missed 2 sessions of HD in the last month. One 4 weeks ago because he was not feeling well and also missed yesterday's session because he was feeling poorly. He has NYHA Class IIIb symptoms. Gets SOB walking around his house. Symptoms improve w/ rest. CP is random, does not always occur w/ exertion. He reports full med compliance at home. Compliant w/ CPAP. He still makes urine.   ED w/u: VVS. O2 sats 97% on RA. SBP 104-123. EKG NSR w/ 1st degree AVB 66 bpm, no change prior prior EKG. AF. WBC 14K. Hgb 10. Covid negative. CXR w/ central vascular congestion. Chest CT w/ small pleural effusions and small pericardial effusion. BNP 1,186 (previous level 513 in 08/2018). Hs troponin 108>>119>>452. SCr 9.59. BUN 69. K 4.3.    Echo 09/24/2019 1. Compared to previous echo MR is more severe. 2. LVEF is moderately depressed Proximal poritons of LV are vigorous but distal anterior wall is hypokinetic and distal inferior,  distal inferoseplal, distal lateral, apical are akinetic . Left ventricular ejection fraction, by estimation, is 35 to 40%. The left ventricle has moderately decreased function. The left ventricle demonstrates regional wall motion abnormalities (see scoring diagram/findings for description). The left ventricular internal cavity size was severely dilated. There is mild left ventricular hypertrophy. Left ventricular diastolic parameters are  consistent with Grade III diastolic dysfunction (restrictive). Elevated left atrial pressure. 3. Right ventricular systolic function is normal. The right ventricular size is normal. 4. Left atrial size was mildly dilated. 5. MR is eccentric, directed posteriorly into LA. The mitral valve is abnormal. Moderate to severe mitral valve regurgitation. 6. Tricuspid valve regurgitation is mild to moderate. 7. The AV is thickened, calcified. Peak and mean gradients through the valve are 27 and 18 mm Hg respectively. THis is increased from previous echo. LVOT/AV VTI ratio is 0.48 consistent with mild to moderate AS.Marland Kitchen The aortic valve is abnormal. Aortic valve regurgitation is not visualized.  TEE 10/08/19 1. Left ventricular ejection fraction, by estimation, is 35%. The left ventricle has moderately decreased function. The left ventricle demonstrates regional wall motion abnormalities, periapical akinesis. The left ventricular internal cavity size was mildly dilated. 2. Right ventricular systolic function is mildly reduced. The right ventricular size is normal. 3. No ASD/PFO by color doppler. Left atrial size was mildly dilated. No left atrial/left atrial appendage thrombus was detected. 4. Peak RV-RA gradient 38 mmHg. 5. The mitral valve was calcified with restriction of the posterior leaflet. There was moderate mitral regurgitation. There was flattening of the pulmonary vein systolic doppler signal but no reversal. By PISA, ERO 0.14 cm^2 (suggests mild MR but I think it is moderate). No mitral stenosis, mean gradient 2 mmHg 6. The aortic valve was moderately calcified and trileaflet. Mild AS with mean gradient 18 mmHg and AVA 1.7 cm^2. Aortic valve regurgitation is not visualized. 7. Normal caliber aorta with grade 3 plaque.  Review of Systems: [y] = yes, [ ]  = no   . General: Weight gain [ ] ; Weight loss [ Y]; Anorexia [ ] ; Fatigue [ ] ; Fever [ ] ; Chills [ ] ; Weakness [ ]   . Cardiac: Chest  pain/pressure [ ] Y; Resting SOB [ ] ; Exertional SOB [ Y]; Orthopnea [Y ]; Pedal Edema [ Y]; Palpitations [ ] ; Syncope [ ] ; Presyncope [ ] ; Paroxysmal nocturnal dyspnea[ Y]  . Pulmonary: Cough [ ] ; Wheezing[ ] ; Hemoptysis[ ] ; Sputum [ ] ; Snoring [ ]   . GI: Vomiting[ ] ; Dysphagia[ ] ; Melena[ ] ; Hematochezia [ ] ; Heartburn[ ] ; Abdominal pain [ ] ; Constipation [ ] ; Diarrhea [ ] ; BRBPR [ ]   . GU: Hematuria[ ] ; Dysuria [ ] ; Nocturia[ ]   . Vascular: Pain in legs with walking [ ] ; Pain in feet with lying flat [ ] ; Non-healing sores [ ] ; Stroke [ ] ; TIA [ ] ; Slurred speech [ ] ;  . Neuro: Headaches[ ] ; Vertigo[ ] ; Seizures[ ] ; Paresthesias[ ] ;Blurred vision [ ] ; Diplopia [ ] ; Vision changes [ ]   . Ortho/Skin: Arthritis [ ] ; Joint pain [ ] ; Muscle pain [ ] ; Joint swelling [ ] ; Back Pain [ ] ; Rash [ ]   . Psych: Depression[ ] ; Anxiety[ ]   . Heme: Bleeding problems [ ] ; Clotting disorders [ ] ; Anemia [ ]   . Endocrine: Diabetes [ ] ; Thyroid dysfunction[ ]   Home Medications Prior to Admission medications   Medication Sig Start Date End Date Taking? Authorizing Provider  acetaminophen (TYLENOL) 500 MG tablet Take 1,000 mg by mouth daily as needed for mild pain.  Yes [provider]  allopurinol (ZYLOPRIM) 100 MG tablet Take 1 tablet (100 mg total) by mouth daily. 05/08/19  Yes Angiulli, Lavon Paganini, PA-C  amiodarone (PACERONE) 200 MG tablet TAKE 1 TABLET BY MOUTH EVERY DAY Patient taking differently: Take 200 mg by mouth daily.  01/26/20  Yes Larey Dresser, MD  aspirin EC 81 MG EC tablet Take 1 tablet (81 mg total) by mouth daily. 05/01/19  Yes Conte, Tessa N, PA-C  bisoprolol (ZEBETA) 5 MG tablet TAKE 1 TABLET AS DIRECTED ON NON DIALYSIS TUES,THURS,SAT,SUN NEEDS APPT Patient taking differently: Take 5 mg by mouth as directed. Take 1 tablet as directed on non dialysis (Tues, Thurs, Sat, Sun) needs appt 11/26/19  Yes Larey Dresser, MD  calcitRIOL (ROCALTROL) 0.25 MCG capsule Take 1 capsule (0.25 mcg  total) by mouth every Monday, Wednesday, and Friday with hemodialysis. 12/30/19  Yes Seawell, Jaimie A, DO  cetirizine (ZYRTEC) 10 MG tablet Take 10 mg by mouth daily as needed for allergies.   Yes [provider]  diphenhydramine-acetaminophen (TYLENOL PM) 25-500 MG TABS tablet Take 1 tablet by mouth at bedtime as needed (sleep).    Yes [provider]  ELIQUIS 5 MG TABS tablet TAKE 1 TABLET BY MOUTH TWICE A DAY Patient taking differently: Take 5 mg by mouth 2 (two) times daily.  10/21/19  Yes Larey Dresser, MD  ferric citrate (AURYXIA) 1 GM 210 MG(Fe) tablet Take 420 mg by mouth 3 (three) times daily with meals.    Yes [provider]  fluticasone (FLONASE) 50 MCG/ACT nasal spray Place 1 spray into both nostrils daily as needed for allergies or rhinitis.   Yes [provider]  gabapentin (NEURONTIN) 100 MG capsule TAKE 1 CAPSULE AT BEDTIME ON HD DAYS (M,W,F) Patient taking differently: Take 100 mg by mouth every Monday, Wednesday, and Friday. Take on dialysis days 12/17/19  Yes [name], MD  HYDROcodone-acetaminophen (NORCO/VICODIN) 5-325 MG tablet Take 1 tablet by mouth every 6 (six) hours as needed for moderate pain. 01/01/20  Yes [name], MD  methocarbamol (ROBAXIN) 500 MG tablet Take 1 tablet (500 mg total) by mouth every 8 (eight) hours as needed for muscle spasms. 01/01/20  Yes [name], MD  midodrine (PROAMATINE) 10 MG tablet Take 1 tablet (10 mg total) by mouth every Monday, Wednesday, and Friday with hemodialysis. 05/08/19  Yes Angiulli, Lavon Paganini, PA-C  Multiple Vitamins-Minerals (CENTRUM SILVER 50+MEN) TABS Take 1 tablet by mouth daily.   Yes [provider]  multivitamin (RENA-VIT) TABS tablet Take 1 tablet by mouth at bedtime. 05/07/19  Yes Angiulli, Lavon Paganini, PA-C  ondansetron (ZOFRAN-ODT) 8 MG disintegrating tablet Take 1 tablet (8 mg total) by mouth every 8 (eight) hours as needed for nausea or vomiting. 10/20/19  Yes  [name], MD  promethazine (PHENERGAN) 12.5 MG tablet Take 1 tablet (12.5 mg total) by mouth every 8 (eight) hours as needed for nausea or vomiting. 01/01/20  Yes [name], MD  rosuvastatin (CRESTOR) 10 MG tablet Take 1 tablet (10 mg total) by mouth daily. 09/29/19 02/04/20 Yes Larey Dresser, MD  cinacalcet (SENSIPAR) 30 MG tablet Take 1 tablet (30 mg total) by mouth daily with supper. 01/21/20   Posey Boyer, MD  pantoprazole (PROTONIX) 40 MG tablet Take 1 tablet (40 mg total) by mouth daily as needed (acid reflux). 01/21/20   Posey Boyer, MD    Past Medical History: Past Medical History:  Diagnosis Date  .  Cervical disc disease   . Chronic kidney disease   . COLONIC POLYPS, HX OF 06/27/2007  . EXOGENOUS OBESITY 01/30/2010  . GERD (gastroesophageal reflux disease)    PMH  . GOUT 01/30/2010  . HYPERCHOLESTEROLEMIA 06/30/2007  . HYPERTENSION 06/27/2007  . LOW BACK PAIN 06/27/2007  . NEPHROLITHIASIS, HX OF 06/27/2007  . OSTEOARTHRITIS 06/27/2007  . SLEEP APNEA, OBSTRUCTIVE, MODERATE 01/27/2009  . Wears dentures    upper    Past Surgical History: Past Surgical History:  Procedure Laterality Date  . AV FISTULA PLACEMENT Right 01/19/2019   Procedure: RIGHT BRACHIOCEPHALIC ARTERIOVENOUS (AV) FISTULA CREATION;  Surgeon: Angelia Mould, MD;  Location: [place];  Service: Vascular;  Laterality: Right;  . CARDIAC CATHETERIZATION    . CARPAL TUNNEL RELEASE     left hand  . COLONOSCOPY     with polypectomy  . CORONARY ARTERY BYPASS GRAFT N/A 04/16/2019   Procedure: CORONARY ARTERY BYPASS GRAFTING (CABG)X3  , WITH ENDOSCOPIC HARVESTING OF RIGHT GREATER SAPHENOUS VEIN;  Surgeon: Lajuana Matte, MD;  Location: [place];  Service: Open Heart Surgery;  Laterality: N/A;  . CORONARY/GRAFT ACUTE MI REVASCULARIZATION N/A 04/16/2019   Procedure: CORONARY/GRAFT ACUTE MI REVASCULARIZATION;  Surgeon: Burnell Blanks, MD;  Location: [place] [facility];  Service: Cardiovascular;   Laterality: N/A;  . INSERTION OF DIALYSIS CATHETER N/A 04/29/2019   Procedure: INSERTION OF TUNNELED DIALYSIS CATHETER, right internal jugular;  Surgeon: Rosetta Posner, MD;  Location: [place];  Service: Vascular;  Laterality: N/A;  . IR US GUIDE BX ASP/DRAIN  12/29/2019  . JOINT REPLACEMENT Left 2005   knee  . LUMBAR LAMINECTOMY    . MULTIPLE TOOTH EXTRACTIONS    . PERIPHERAL VASCULAR BALLOON ANGIOPLASTY Right 06/25/2019   Procedure: PERIPHERAL VASCULAR BALLOON ANGIOPLASTY;  Surgeon: Marty Heck, MD;  Location: [place] [facility];  Service: Cardiovascular;  Laterality: Right;  . RADIOFREQUENCY ABLATION KIDNEY    . RIGHT/LEFT HEART CATH AND CORONARY ANGIOGRAPHY N/A 04/16/2019   Procedure: RIGHT/LEFT HEART CATH AND CORONARY ANGIOGRAPHY;  Surgeon: Burnell Blanks, MD;  Location: [place] [facility];  Service: Cardiovascular;  Laterality: N/A;  . TEE WITHOUT CARDIOVERSION N/A 04/16/2019   Procedure: TRANSESOPHAGEAL ECHOCARDIOGRAM (TEE);  Surgeon: Lajuana Matte, MD;  Location: [place];  Service: Open Heart Surgery;  Laterality: N/A;  . TEE WITHOUT CARDIOVERSION N/A 10/08/2019   Procedure: TRANSESOPHAGEAL ECHOCARDIOGRAM (TEE);  Surgeon: Larey Dresser, MD;  Location: [facility];  Service: Cardiovascular;  Laterality: N/A;  . TOTAL KNEE ARTHROPLASTY     left  . VENTRICULAR ASSIST DEVICE INSERTION N/A 04/16/2019   Procedure: VENTRICULAR ASSIST DEVICE INSERTION;  Surgeon: Burnell Blanks, MD;  Location: [place] [facility];  Service: Cardiovascular;  Laterality: N/A;    Family History: Family History  Problem Relation Age of Onset  . Hypertension Other   . Diabetes Sister   . Hyperlipidemia Sister   . Sleep apnea Sister     Social History: Social History   Socioeconomic History  . Marital status: Legally Separated    Spouse name: Not on file  . Number of children: 2  . Years of education: Not on file  . Highest education level: Not on file  Occupational History    . Not on file  Tobacco Use  . Smoking status: Former Smoker    Quit date: 07/23/1980    Years since quitting: 39.5  . Smokeless tobacco: Never Used  Vaping Use  . Vaping Use: Never used  Substance  and Sexual Activity  . Alcohol use: Yes    Alcohol/week: 1.0 standard drink    Types: 1 Cans of beer per week    Comment: rare  . Drug use: No  . Sexual activity: Not Currently  Other Topics Concern  . Not on file  Social History Narrative   Pt lives alone. He has one son who lives within 3 miles who he usually sees daily. He has two sisters living in the next town over who he also sees on a weekly basis. He is a religious man who attends church at least 3 Sundays each month.   He operates [employer] full time.    Social Determinants of Health   Financial Resource Strain:   . Difficulty of Paying Living Expenses:   Food Insecurity:   . Worried About Charity fundraiser in the Last Year:   . Arboriculturist in the Last Year:   Transportation Needs:   . Film/video editor (Medical):   Marland Kitchen Lack of Transportation (Non-Medical):   Physical Activity:   . Days of Exercise per Week:   . Minutes of Exercise per Session:   Stress:   . Feeling of Stress :   Social Connections:   . Frequency of Communication with Friends and Family:   . Frequency of Social Gatherings with Friends and Family:   . Attends Religious Services:   . Active Member of Clubs or Organizations:   . Attends Archivist Meetings:   Marland Kitchen Marital Status:     Allergies:  Allergies  Allergen Reactions  . Codeine Phosphate Other (See Comments)    Hyperactive     Objective:    Vital Signs:   Temp:  [98 F (36.7 C)-99.1 F (37.3 C)] 98.6 F (37 C) (07/14 2329) Pulse Rate:  [63-94] 89 (07/15 0200) Resp:  [14-20] 18 (07/15 0200) BP: (104-123)/(65-84) 104/65 (07/15 0200) SpO2:  [97 %-100 %] 99 % (07/15 0200) Weight:  [132 kg] 132 kg (07/14 1617)    Weight change: Filed Weights   02/03/20 1617   Weight: 132 kg    Intake/Output:   Intake/Output Summary (Last 24 hours) at 02/04/2020 2119 Last data filed at 02/04/2020 0720 Gross per 24 hour  Intake --  Output 125 ml  Net -125 ml      Physical Exam    General:  Chronically ill appearing, super morbidly obese WM. No resp difficulty HEENT: normal Neck: supple. Thick neck, JVD assessment difficult . Carotids 2+ bilat; no bruits. No lymphadenopathy or thyromegaly appreciated. Cor: PMI nondisplaced. Regular rate & rhythm. No rubs, gallops or murmurs. Lungs: clear, no wheezing  Abdomen: obese, soft, nontender, nondistended. No hepatosplenomegaly. No bruits or masses. Good bowel sounds. Extremities: no cyanosis, clubbing, rash, trace- 1+ bilateral LE edema Neuro: alert & orientedx3, cranial nerves grossly intact. moves all 4 extremities w/o difficulty. Affect pleasant   Telemetry   NSR 60s bpm. No afib.   EKG    NSR w/ 1st degree AVB 66 bpm, no change prior prior EKG.  Labs   Basic Metabolic Panel: Recent Labs  Lab 02/03/20 1644 02/04/20 0405  NA 139 140  K 4.4 4.3  CL 96* 98  CO2 26 24  GLUCOSE 98 77  BUN 61* 69*  CREATININE 8.99* 9.59*  CALCIUM 11.5* 11.1*  MG  --  2.4    Liver Function Tests: Recent Labs  Lab 02/04/20 0405  AST 27  ALT 9  ALKPHOS 77  BILITOT 1.4*  PROT 5.7*  ALBUMIN 2.7*   No results for input(s): LIPASE, AMYLASE in the last 168 hours. No results for input(s): AMMONIA in the last 168 hours.  CBC: Recent Labs  Lab 02/03/20 1644 02/04/20 0405  WBC 13.5* 11.1*  NEUTROABS  --  8.2*  HGB 9.8* 9.2*  HCT 32.9* 30.5*  MCV 92.2 90.8  PLT 194 174    Cardiac Enzymes: No results for input(s): CKTOTAL, CKMB, CKMBINDEX, TROPONINI in the last 168 hours.  BNP: BNP (last 3 results) Recent Labs    02/05/19 0951  BNP 1,186.2*    ProBNP (last 3 results) No results for input(s): PROBNP in the last 8760 hours.   CBG: No results for input(s): GLUCAP in the last 168  hours.  Coagulation Studies: No results for input(s): LABPROT, INR in the last 72 hours.   Imaging   DG Chest 2 View  Result Date: 02/03/2020 CLINICAL DATA:  Dyspnea on exertion for 3 months, previous tobacco abuse EXAM: CHEST - 2 VIEW COMPARISON:  12/28/2019 FINDINGS: Frontal and lateral views of the chest demonstrates stable enlargement of the cardiac silhouette. Postsurgical changes from prior bypass surgery. There is chronic central vascular congestion. Retrocardiac consolidation may reflect atelectasis. No effusion or pneumothorax. No acute bony abnormalities. IMPRESSION: 1. Chronic central vascular congestion. 2. Retrocardiac consolidation favor atelectasis. 3. Stable cardiomegaly. Electronically Signed   By: Randa Ngo M.D.   On: 02/03/2020 16:56   CT Chest Wo Contrast  Result Date: 02/04/2020 CLINICAL DATA:  69 year old male with increasing shortness of breath. Missed dialysis. Retrocardiac opacity on chest radiographs. EXAM: CT CHEST WITHOUT CONTRAST TECHNIQUE: Multidetector CT imaging of the chest was performed following the standard protocol without IV contrast. COMPARISON:  Chest radiographs earlier tonight. Chest CT 01/28/2010. FINDINGS: Cardiovascular: Calcified aortic atherosclerosis. Prior CABG. Cardiomegaly is increased since 2011. Small volume pericardial effusion with simple fluid density (series 3, image 106). Vascular patency is not evaluated in the absence of IV contrast. Mediastinum/Nodes: Negative.  No mediastinal lymphadenopathy. Lungs/Pleura: Small bilateral layering pleural effusions. Simple fluid density favoring transudate. Fluid mildly tracks into the left major fissure. Associated compressive atelectasis. Major airways are patent. Occasional superimposed faint upper lobe subpleural densities which are probably mild scarring. Upper Abdomen: Cholelithiasis. No pericholecystic inflammation is evident. Negative visible noncontrast liver, spleen, pancreas, and bowel in the  upper abdomen. There is new low-density left adrenal gland thickening since 2011 which could be adrenal hyperplasia or benign adenoma. Musculoskeletal: Prior sternotomy. 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 No acute osseous abnormality identified. IMPRESSION: 1. Small layering pleural effusions with compressive atelectasis. 2. Cardiomegaly with small volume pericardial effusion. Prior CABG. Aortic Atherosclerosis (ICD10-I70.0). 3. Cholelithiasis. Electronically Signed   By: Genevie Ann M.D.   On: 02/04/2020 00:50      Medications:     Current Medications: . allopurinol  100 mg Oral Daily  . amiodarone  200 mg Oral Daily  . apixaban  5 mg Oral BID  . aspirin  81 mg Oral Daily  . bisoprolol  5 mg Oral Once per day on Sun Tue Thu Sat  . [START ON 02/05/2020] calcitRIOL  0.25 mcg Oral Q M,W,F-HD  . ferric citrate  420 mg Oral TID WC  . [START ON 02/05/2020] midodrine  10 mg Oral Q M,W,F-HD  . multivitamin  1 tablet Oral QHS  . rosuvastatin  10 mg Oral Daily  . sodium chloride flush  3 mL Intravenous Once     Infusions:  Assessment/Plan    1. Acute on Chronic Combined Systolic and Diastolic CHF - Echo with EF in 30% range with peri-apical severe akinesis in 9/20. Ischemic cardiomyopathy. Now s/p CABG. Repeat Echo 3/21 showed EF 35-40% with moderate-severe eccentric MR and mild AS. GIIIDD w/ mild LVH. RV was normal. This was followed by TEE on 10/08/19 to better assess the mitral valve, which showed only moderate MR. RV function mildly reduced by TEE. LVEF 35%.  - Admitted w/ NYHA Class IIIb symptoms. Has had recent issues w/ nausea during dialysis leading to reduction in total volume removal during HD sessions. He has also missed 2 sessions of HD in the last month (missed yesterday's session).  - consult nephrology for HD and try to challenge w/ increased volume removal  - LFTs ok  - LEs are a bit cool to touch. Consider  repeat RHC to check hemodynamics  - medical therapy limited by CKD and Hypotension (requires midodrine on HD days). No ARB/ARNi, spiro nor dig  - on low dose bisoprolol on non HD days - EF out of range for ICD (35-40%)   2. CAD:  - Developed Cardiogenic shock in setting of atrial flutter with RVR in 03/2019.  Probable CTO mid LAD with extensive disease in other arteries.Patient taken urgently for CABG supported by Impella. 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 Kipp Brood (unable to graft LAD,  LIMA-D2, SVG-OM2, SVG-PDA).   - recent exertional dyspnea and occasional CP, but suspect most likely 2/2 CHF w/ volume overload - Hs trop this admit, 108>>119>>452 (in setting of CKD). Hs troponin rose to 1,297 during previous admit 9/20. EKG w/ no ST changes. - will continue to cycle hs trops to ensure no further rise in level  - doubt ACS but but if further rise in troponin or if RHC pursued, may consider LHC to assess patency of grafts   - for now, continue medical therapy w/ ASA,  blocker and statin (LDL at goal at 63 mg/dl).   3. H/o Atrial Fibrillation/ Flutter - currently NSR on EKG and tele. HR controlled in the 60s.  - on PO amiodarone 200 mg daily. ?Discontinuing. No documented recurrence since 9/20 - LFTs ok. Check TSH  - continue low dose bisoprolol 5 on non HD days  - continue Eliquis for a/c   4. ESRD - on HD MWF, followed by [facility] - missed session yesterday - Volume assessment difficult given body habits but based on recent symptoms and reduced volume removal at HD, suspect volume overload. Will need HD today. Suspect he will need greater volume removal during sessions and new target dry wt.   5. Aortic Stenosis - TEE 3/21: moderately calcified and trileaflet. Mild AS with mean gradient 18 mmHg and AVA 1.7 cm^2. No AI   6. Mitral Regurgitation  - TTE 3/21 suggestive of moderate-severe eccentric MR - F/u TEE 3/21 showed the mitral valve was calcified with restriction of the posterior  leaflet. There was moderate mitral regurgitation. There was flattening of the pulmonary vein systolic doppler signal but no reversal. By PISA, ERO 0.14 cm^2 (suggests mild MR but think it is moderate). No mitral stenosis, mean gradient 2 mmHg.    7. Anemia - chronic, likely anemia of chronic disease from CKD  - hgb 9.2  8. OSA - reports full compliance w/ home CPAP - order CPAP qhs while inpatient   9. Morbid Obesity  - Body mass index is 39.47 kg/m.    Length of Stay: 0  Brittainy Simmons,  PA-C  02/04/2020, 9:22 AM  Advanced Heart Failure Team Pager [phone] (M-F; 7a - 4p)  Please contact [facility] for night-coverage after hours (4p -7a ) and weekends on [email or web address]  Patient seen with PA, agree with the above note.  Main complaint has been fatigue, shortness of breath, and difficulty tolerating HD. He has been signing off early from HD due to cramping and nausea. He missed HD completely yesterday.   He has had dyspnea walking around his house for several weeks as well as orthopnea and PND.  He has occasional atypical chest pain, but this symptom has not been prominent.    Initially, he had a mild elevation in HS-TnI most consistent with CHF/demand ischemia.  However, HS-TnI this afternoon is up to 3226.  He has no chest pain currently and is seen during HD.   ECG: NSR, nonspecific T wave flattening.   General: NAD Neck: JVP 12 cm, no thyromegaly or thyroid nodule.  Lungs: Clear to auscultation bilaterally with normal respiratory effort. CV: Nondisplaced PMI.  Heart regular S1/S2, no S3/S4, 2/6 HSM apex.  1+ edema 1/2 to knees bilaterally.  No carotid bruit.  Normal pedal pulses.  Abdomen: Soft, nontender, no hepatosplenomegaly, no distention.  Skin: Intact without lesions or rashes.  Neurologic: Alert and oriented x 3.  Psych: Normal affect. Extremities: No clubbing or cyanosis.  HEENT: Normal.   1. CAD: Cardiogenic shock in setting of atrial flutter with RVR in  03/2019.  Probable CTO mid LAD with extensive disease in other arteries.Patient taken urgently for CABG supported by Impella. 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 Kipp Brood (unable to graft LAD,  LIMA-D2, SVG-OM2, SVG-PDA).  This admission seems more like CHF exacerbation with inadequate dialysis (primarily dyspnea and volume overload), but HS-TnI up to 3226 on repeat concerning for NSTEMI/true ACS.  No changes on ECG. - Continue ASA 81.  -Continue statin.  - Hold Eliquis, start heparin gtt at appropriate interval.   - Will plan coronary angiography probably tomorrow afternoon after allowing some Eliquis washout (had last dose this morning).  We discussed risks/benefits with patient, he agreed to procedure.   2. Acute on chronic systolic CHF: Echo with EF in 30% range with peri-apical severe akinesis in 9/20. Ischemic cardiomyopathy. Now s/p CABG. TEE in 3/21 with EF 35%, periapical akinesis, mildly decreased RV function, moderate MR, mild AS.  He is volume overloaded with NYHA class IIIb symptoms.  He signs off dialysis early due to cramping and nausea, missed HD yesterday.  He does appear volume overloaded on exam.     - Continue bisoprolol 5 mg on non-HD days. - BP too low to add additional cardiomyopathy meds.  - He gets midodrine pre-HD, continue. BP does not appear to be low on non-HD days.  - Needs HD for volume removal, getting today.    3. Atrial fibrillation/flutter: NSR on EKG. No palpitations.  -Will keep on amiodarone 200 mg daily for now with NSTEMI, had planned to stop eventually.   - On eliquis 5 mg twice a day => hold for cath and cover with heparin gtt.  4. ESRD: HD MWF. 5. Aortic stenosis: Mild on TEE in 3/21.  6. Mitral regurgitation: Moderate MR on 3/21 TEE.    Loralie Champagne 02/04/2020 4:40 PM

## 2020-02-04 NOTE — Progress Notes (Signed)
Pt refused to wear CPAP at this time. No distress noted, SpO2 97% on room air. RT will continue to monitor.

## 2020-02-04 NOTE — Progress Notes (Signed)
[place] for Heparin Indication: chest pain/ACS  Allergies  Allergen Reactions   Codeine Phosphate Other (See Comments)    Hyperactive     Patient Measurements: Height: 6' (182.9 cm) Weight: 132 kg (291 lb) IBW/kg (Calculated) : 77.6 Heparin Dosing Weight: 107.5 kg  Vital Signs: Temp: 98 F (36.7 C) (07/15 1700) Temp Source: Oral (07/15 1700) BP: 110/70 (07/15 1700) Pulse Rate: 61 (07/15 1700)  Labs: Recent Labs    02/03/20 1644 02/03/20 1644 02/03/20 2204 02/04/20 0405 02/04/20 1146  HGB 9.8*  --   --  9.2*  --   HCT 32.9*  --   --  30.5*  --   PLT 194  --   --  174  --   CREATININE 8.99*  --   --  9.59*  --   TROPONINIHS 108*   < > 119* 452* 3,226*   < > = values in this interval not displayed.    Estimated Creatinine Clearance: 10.2 mL/min (A) (by C-G formula based on SCr of 9.59 mg/dL (H)).   Medical History: Past Medical History:  Diagnosis Date   Cervical disc disease    Chronic kidney disease    COLONIC POLYPS, HX OF 06/27/2007   EXOGENOUS OBESITY 01/30/2010   GERD (gastroesophageal reflux disease)    PMH   GOUT 01/30/2010   HYPERCHOLESTEROLEMIA 06/30/2007   HYPERTENSION 06/27/2007   LOW BACK PAIN 06/27/2007   NEPHROLITHIASIS, HX OF 06/27/2007   OSTEOARTHRITIS 06/27/2007   SLEEP APNEA, OBSTRUCTIVE, MODERATE 01/27/2009   Wears dentures    upper    Medications:  Scheduled:   allopurinol  100 mg Oral Daily   amiodarone  200 mg Oral Daily   aspirin  81 mg Oral Daily   bisoprolol  5 mg Oral Once per day on Sun Tue Thu Sat   [START ON 02/05/2020] calcitRIOL  0.25 mcg Oral Q M,W,F-HD   Chlorhexidine Gluconate Cloth  6 each Topical Q0600   cinacalcet  30 mg Oral Q supper   ferric citrate  420 mg Oral TID WC   [START ON 02/05/2020] midodrine  15 mg Oral Q M,W,F-HD   multivitamin  1 tablet Oral QHS   rosuvastatin  10 mg Oral Daily   sodium chloride flush  3 mL Intravenous Once   sodium  chloride flush  3 mL Intravenous Q12H    Assessment: Patient is a 10 yom that is being seen for an acute on chronic combined systolic and diastolic heart failure. After evaluation of the patient the patient's trop has been trending upward and there are concerns for an NSTEMI. The plan is for the patient to have a cath tomorrow and pharmacy has been asked to start heparin while they hold the patients apixaban for the cath.   Last dose of Apixaban was at 1000 on 7/15 Goal of Therapy:  aPTT 66-102 seconds Monitor platelets by anticoagulation protocol: Yes   Plan:  - No Heparin bolus as on Apixaban, last dose was at 1000 on 7/15 - Start heparin drip @ 1250 units/hr on 7/15 at 2200  - Will monitor heparin using aPTT until aPTT and Heparin levels correlate - aPTT level in ~ 8hours  - Monitor patient for s/s of bleeding and CBC while on heparin   Duanne Limerick PharmD. BCPS  02/04/2020,5:48 PM

## 2020-02-05 ENCOUNTER — Encounter (HOSPITAL_COMMUNITY)
Admission: EM | Disposition: A | Discharge status: Home / Self Care | Payer: Self-pay | Source: Home / Self Care | Attending: Internal Medicine

## 2020-02-05 ENCOUNTER — Inpatient Hospital Stay: Payer: Medicare Other | Admitting: Family Medicine

## 2020-02-05 DIAGNOSIS — I5043 Acute on chronic combined systolic (congestive) and diastolic (congestive) heart failure: Secondary | ICD-10-CM | POA: Diagnosis present

## 2020-02-05 DIAGNOSIS — I214 Non-ST elevation (NSTEMI) myocardial infarction: Secondary | ICD-10-CM

## 2020-02-05 DIAGNOSIS — Z20822 Contact with and (suspected) exposure to covid-19: Secondary | ICD-10-CM | POA: Diagnosis present

## 2020-02-05 DIAGNOSIS — I4891 Unspecified atrial fibrillation: Secondary | ICD-10-CM | POA: Diagnosis not present

## 2020-02-05 DIAGNOSIS — Z7901 Long term (current) use of anticoagulants: Secondary | ICD-10-CM | POA: Diagnosis not present

## 2020-02-05 DIAGNOSIS — I482 Chronic atrial fibrillation, unspecified: Secondary | ICD-10-CM | POA: Diagnosis present

## 2020-02-05 DIAGNOSIS — I340 Nonrheumatic mitral (valve) insufficiency: Secondary | ICD-10-CM | POA: Diagnosis present

## 2020-02-05 DIAGNOSIS — J9811 Atelectasis: Secondary | ICD-10-CM | POA: Diagnosis present

## 2020-02-05 DIAGNOSIS — I132 Hypertensive heart and chronic kidney disease with heart failure and with stage 5 chronic kidney disease, or end stage renal disease: Secondary | ICD-10-CM | POA: Diagnosis present

## 2020-02-05 DIAGNOSIS — I248 Other forms of acute ischemic heart disease: Secondary | ICD-10-CM | POA: Diagnosis present

## 2020-02-05 DIAGNOSIS — E785 Hyperlipidemia, unspecified: Secondary | ICD-10-CM | POA: Diagnosis present

## 2020-02-05 DIAGNOSIS — I350 Nonrheumatic aortic (valve) stenosis: Secondary | ICD-10-CM | POA: Diagnosis present

## 2020-02-05 DIAGNOSIS — Z9115 Patient's noncompliance with renal dialysis: Secondary | ICD-10-CM | POA: Diagnosis not present

## 2020-02-05 DIAGNOSIS — I2510 Atherosclerotic heart disease of native coronary artery without angina pectoris: Secondary | ICD-10-CM | POA: Diagnosis not present

## 2020-02-05 DIAGNOSIS — M109 Gout, unspecified: Secondary | ICD-10-CM | POA: Diagnosis present

## 2020-02-05 DIAGNOSIS — I25119 Atherosclerotic heart disease of native coronary artery with unspecified angina pectoris: Secondary | ICD-10-CM | POA: Diagnosis present

## 2020-02-05 DIAGNOSIS — I509 Heart failure, unspecified: Secondary | ICD-10-CM

## 2020-02-05 DIAGNOSIS — I471 Supraventricular tachycardia: Secondary | ICD-10-CM | POA: Diagnosis not present

## 2020-02-05 DIAGNOSIS — I255 Ischemic cardiomyopathy: Secondary | ICD-10-CM | POA: Diagnosis present

## 2020-02-05 DIAGNOSIS — I5023 Acute on chronic systolic (congestive) heart failure: Secondary | ICD-10-CM | POA: Diagnosis not present

## 2020-02-05 DIAGNOSIS — R0600 Dyspnea, unspecified: Secondary | ICD-10-CM | POA: Diagnosis present

## 2020-02-05 DIAGNOSIS — I959 Hypotension, unspecified: Secondary | ICD-10-CM | POA: Diagnosis present

## 2020-02-05 DIAGNOSIS — Z79899 Other long term (current) drug therapy: Secondary | ICD-10-CM | POA: Diagnosis not present

## 2020-02-05 DIAGNOSIS — I9589 Other hypotension: Secondary | ICD-10-CM | POA: Diagnosis present

## 2020-02-05 DIAGNOSIS — K219 Gastro-esophageal reflux disease without esophagitis: Secondary | ICD-10-CM | POA: Diagnosis present

## 2020-02-05 DIAGNOSIS — I252 Old myocardial infarction: Secondary | ICD-10-CM | POA: Diagnosis not present

## 2020-02-05 DIAGNOSIS — D631 Anemia in chronic kidney disease: Secondary | ICD-10-CM | POA: Diagnosis present

## 2020-02-05 DIAGNOSIS — G4733 Obstructive sleep apnea (adult) (pediatric): Secondary | ICD-10-CM | POA: Diagnosis present

## 2020-02-05 DIAGNOSIS — N186 End stage renal disease: Secondary | ICD-10-CM | POA: Diagnosis present

## 2020-02-05 DIAGNOSIS — Z951 Presence of aortocoronary bypass graft: Secondary | ICD-10-CM | POA: Diagnosis not present

## 2020-02-05 DIAGNOSIS — E669 Obesity, unspecified: Secondary | ICD-10-CM | POA: Diagnosis present

## 2020-02-05 DIAGNOSIS — R079 Chest pain, unspecified: Secondary | ICD-10-CM | POA: Diagnosis not present

## 2020-02-05 HISTORY — PX: LEFT HEART CATH AND CORS/GRAFTS ANGIOGRAPHY: CATH118250

## 2020-02-05 LAB — BASIC METABOLIC PANEL
Anion gap: 15 (ref 5–15)
BUN: 42 mg/dL — ABNORMAL HIGH (ref 8–23)
CO2: 25 mmol/L (ref 22–32)
Calcium: 10.5 mg/dL — ABNORMAL HIGH (ref 8.9–10.3)
Chloride: 98 mmol/L (ref 98–111)
Creatinine, Ser: 6.85 mg/dL — ABNORMAL HIGH (ref 0.61–1.24)
GFR calc Af Amer: 9 mL/min — ABNORMAL LOW (ref >60)
GFR calc non Af Amer: 7 mL/min — ABNORMAL LOW (ref >60)
Glucose, Bld: 83 mg/dL (ref 70–99)
Potassium: 3.9 mmol/L (ref 3.5–5.1)
Sodium: 138 mmol/L (ref 135–145)

## 2020-02-05 LAB — CBC
HCT: 29.2 % — ABNORMAL LOW (ref 39.0–52.0)
Hemoglobin: 9 g/dL — ABNORMAL LOW (ref 13.0–17.0)
MCH: 28 pg (ref 26.0–34.0)
MCHC: 30.8 g/dL (ref 30.0–36.0)
MCV: 91 fL (ref 80.0–100.0)
Platelets: 180 10*3/uL (ref 150–400)
RBC: 3.21 MIL/uL — ABNORMAL LOW (ref 4.22–5.81)
RDW: 20.9 % — ABNORMAL HIGH (ref 11.5–15.5)
WBC: 8.6 10*3/uL (ref 4.0–10.5)
nRBC: 0 % (ref 0.0–0.2)

## 2020-02-05 LAB — APTT: aPTT: 69 seconds — ABNORMAL HIGH (ref 24–36)

## 2020-02-05 LAB — POCT ACTIVATED CLOTTING TIME: Activated Clotting Time: 136 seconds

## 2020-02-05 SURGERY — LEFT HEART CATH AND CORS/GRAFTS ANGIOGRAPHY
Anesthesia: LOCAL

## 2020-02-05 MED ORDER — APIXABAN 5 MG PO TABS
5.0000 mg | ORAL_TABLET | Freq: Two times a day (BID) | ORAL | Status: DC
  Administered 2020-02-06 – 2020-02-07 (×3): 5 mg via ORAL
  Filled 2020-02-05 (×3): qty 1

## 2020-02-05 MED ORDER — LABETALOL HCL 5 MG/ML IV SOLN: 10.0000 mg | INTRAVENOUS | Status: AC | PRN

## 2020-02-05 MED ORDER — AMIODARONE HCL 150 MG/3ML IV SOLN
INTRAVENOUS | Status: DC | PRN
  Administered 2020-02-05: 150 mg via INTRAVENOUS

## 2020-02-05 MED ORDER — SODIUM CHLORIDE 0.9% FLUSH: 3.0000 mL | INTRAVENOUS | Status: DC | PRN

## 2020-02-05 MED ORDER — HYDRALAZINE HCL 20 MG/ML IJ SOLN: 10.0000 mg | INTRAMUSCULAR | Status: AC | PRN

## 2020-02-05 MED ORDER — ACETAMINOPHEN 325 MG PO TABS: 650.0000 mg | ORAL_TABLET | ORAL | Status: DC | PRN

## 2020-02-05 MED ORDER — LIDOCAINE HCL (PF) 1 % IJ SOLN
INTRAMUSCULAR | Status: AC
  Filled 2020-02-05: qty 30

## 2020-02-05 MED ORDER — MIDAZOLAM HCL 2 MG/2ML IJ SOLN
INTRAMUSCULAR | Status: AC
  Filled 2020-02-05: qty 2

## 2020-02-05 MED ORDER — ASPIRIN 81 MG PO CHEW
81.0000 mg | CHEWABLE_TABLET | Freq: Every day | ORAL | Status: DC
  Administered 2020-02-06 – 2020-02-07 (×2): 81 mg via ORAL
  Filled 2020-02-05 (×3): qty 1

## 2020-02-05 MED ORDER — FENTANYL CITRATE (PF) 100 MCG/2ML IJ SOLN
INTRAMUSCULAR | Status: DC | PRN
  Administered 2020-02-05: 25 ug via INTRAVENOUS

## 2020-02-05 MED ORDER — SODIUM CHLORIDE 0.9 % IV SOLN: 250.0000 mL | INTRAVENOUS | Status: DC | PRN

## 2020-02-05 MED ORDER — CALCITRIOL 0.25 MCG PO CAPS
ORAL_CAPSULE | ORAL | Status: AC
  Administered 2020-02-05: 0.25 ug via ORAL
  Filled 2020-02-05: qty 1

## 2020-02-05 MED ORDER — MIDAZOLAM HCL 2 MG/2ML IJ SOLN
INTRAMUSCULAR | Status: DC | PRN
  Administered 2020-02-05: 1 mg via INTRAVENOUS

## 2020-02-05 MED ORDER — METOPROLOL TARTRATE 5 MG/5ML IV SOLN
INTRAVENOUS | Status: DC | PRN
  Administered 2020-02-05: 2.5 mg via INTRAVENOUS

## 2020-02-05 MED ORDER — METOPROLOL TARTRATE 5 MG/5ML IV SOLN
INTRAVENOUS | Status: AC
  Filled 2020-02-05: qty 5

## 2020-02-05 MED ORDER — AMIODARONE HCL IN DEXTROSE 360-4.14 MG/200ML-% IV SOLN
INTRAVENOUS | Status: AC
  Filled 2020-02-05: qty 200

## 2020-02-05 MED ORDER — HEPARIN SODIUM (PORCINE) 1000 UNIT/ML DIALYSIS: 2000.0000 [IU] | Freq: Once | INTRAMUSCULAR | Status: DC

## 2020-02-05 MED ORDER — SODIUM CHLORIDE 0.9 % IV SOLN
INTRAVENOUS | Status: DC
Start: 2020-02-06 — End: 2020-02-05

## 2020-02-05 MED ORDER — HEPARIN (PORCINE) IN NACL 1000-0.9 UT/500ML-% IV SOLN
INTRAVENOUS | Status: AC
  Filled 2020-02-05: qty 1000

## 2020-02-05 MED ORDER — AMIODARONE HCL 150 MG/3ML IV SOLN
INTRAVENOUS | Status: AC
  Filled 2020-02-05: qty 3

## 2020-02-05 MED ORDER — VERAPAMIL HCL 2.5 MG/ML IV SOLN: INTRAVENOUS | Status: DC | PRN

## 2020-02-05 MED ORDER — IOHEXOL 350 MG/ML SOLN
INTRAVENOUS | Status: DC | PRN
  Administered 2020-02-05: 80 mL via INTRA_ARTERIAL

## 2020-02-05 MED ORDER — SODIUM CHLORIDE 0.9 % IV SOLN: INTRAVENOUS | Status: DC

## 2020-02-05 MED ORDER — ONDANSETRON HCL 4 MG/2ML IJ SOLN: 4.0000 mg | Freq: Four times a day (QID) | INTRAMUSCULAR | Status: DC | PRN

## 2020-02-05 MED ORDER — LIDOCAINE HCL (PF) 1 % IJ SOLN
INTRAMUSCULAR | Status: DC | PRN
  Administered 2020-02-05: 20 mL

## 2020-02-05 MED ORDER — ASPIRIN 81 MG PO CHEW: 81.0000 mg | CHEWABLE_TABLET | ORAL | Status: DC

## 2020-02-05 MED ORDER — FENTANYL CITRATE (PF) 100 MCG/2ML IJ SOLN
INTRAMUSCULAR | Status: AC
  Filled 2020-02-05: qty 2

## 2020-02-05 MED ORDER — SODIUM CHLORIDE 0.9% FLUSH
3.0000 mL | Freq: Two times a day (BID) | INTRAVENOUS | Status: DC
  Administered 2020-02-06 – 2020-02-07 (×2): 3 mL via INTRAVENOUS

## 2020-02-05 MED ORDER — HEPARIN (PORCINE) IN NACL 1000-0.9 UT/500ML-% IV SOLN
INTRAVENOUS | Status: DC | PRN
  Administered 2020-02-05 (×2): 500 mL

## 2020-02-05 SURGICAL SUPPLY — 12 items
CATH INFINITI 5 FR IM (CATHETERS) ×1 IMPLANT
CATH INFINITI 5FR MULTPACK ANG (CATHETERS) ×1 IMPLANT
DEVICE CONTINUOUS FLUSH (MISCELLANEOUS) ×1 IMPLANT
GUIDEWIRE INQWIRE 1.5J.035X260 (WIRE) IMPLANT
HOVERMATT SINGLE USE (MISCELLANEOUS) ×1 IMPLANT
INQWIRE 1.5J .035X260CM (WIRE) ×2
KIT HEART LEFT (KITS) ×2 IMPLANT
PACK CARDIAC CATHETERIZATION (CUSTOM PROCEDURE TRAY) ×2 IMPLANT
SHEATH PINNACLE 5F 10CM (SHEATH) ×1 IMPLANT
TRANSDUCER W/STOPCOCK (MISCELLANEOUS) ×2 IMPLANT
TUBING CIL FLEX 10 FLL-RA (TUBING) ×2 IMPLANT
WIRE EMERALD 3MM-J .035X150CM (WIRE) ×1 IMPLANT

## 2020-02-05 NOTE — H&P (View-Only) (Signed)
Patient ID: [name], male   DOB: 11-26-1950, 69 y.o.   MRN: [id number]     Advanced Heart Failure Rounding Note  PCP-Cardiologist: No primary care provider on file.   Subjective:    No complaints this morning on dialysis.  He has had mild atypical chest pain on and off for 2 months he says. Main issues has been exertional dyspnea.     Objective:   Weight Range: 127.6 kg Body mass index is 38.15 kg/m.   Vital Signs:   Temp:  [97.6 F (36.4 C)-98.5 F (36.9 C)] 97.8 F (36.6 C) (07/16 0733) Pulse Rate:  [47-77] 61 (07/16 0900) Resp:  [12-18] 18 (07/16 0733) BP: (90-127)/(44-88) 127/74 (07/16 0900) SpO2:  [94 %-100 %] 94 % (07/16 0733) Weight:  [127.6 kg] 127.6 kg (07/16 0733)    Weight change: Filed Weights   02/03/20 1617 02/05/20 0733  Weight: 132 kg 127.6 kg    Intake/Output:   Intake/Output Summary (Last 24 hours) at 02/05/2020 0928 Last data filed at 02/05/2020 0300 Gross per 24 hour  Intake 69.08 ml  Output 2655 ml  Net -2585.92 ml      Physical Exam    General:  Well appearing. No resp difficulty HEENT: Normal Neck: Supple. JVP 12 cm. Carotids 2+ bilat; no bruits. No lymphadenopathy or thyromegaly appreciated. Cor: PMI nondisplaced. Regular rate & rhythm. No rubs, gallops or murmurs. Lungs: Clear Abdomen: Soft, nontender, nondistended. No hepatosplenomegaly. No bruits or masses. Good bowel sounds. Extremities: No cyanosis, clubbing, rash.  1+ ankle edema.  Neuro: Alert & orientedx3, cranial nerves grossly intact. moves all 4 extremities w/o difficulty. Affect pleasant   Telemetry   NSR, personally reviewed  Labs    CBC Recent Labs    02/04/20 0405 02/05/20 0439  WBC 11.1* 8.6  NEUTROABS 8.2*  --   HGB 9.2* 9.0*  HCT 30.5* 29.2*  MCV 90.8 91.0  PLT 174 676   Basic Metabolic Panel Recent Labs    02/04/20 0405 02/05/20 0439  NA 140 138  K 4.3 3.9  CL 98 98  CO2 24 25  GLUCOSE 77 83  BUN 69* 42*  CREATININE 9.59* 6.85*    CALCIUM 11.1* 10.5*  MG 2.4  --    Liver Function Tests Recent Labs    02/04/20 0405  AST 27  ALT 9  ALKPHOS 77  BILITOT 1.4*  PROT 5.7*  ALBUMIN 2.7*   No results for input(s): LIPASE, AMYLASE in the last 72 hours. Cardiac Enzymes No results for input(s): CKTOTAL, CKMB, CKMBINDEX, TROPONINI in the last 72 hours.  BNP: BNP (last 3 results) Recent Labs    02/05/19 0951  BNP 1,186.2*    ProBNP (last 3 results) No results for input(s): PROBNP in the last 8760 hours.   D-Dimer No results for input(s): DDIMER in the last 72 hours. Hemoglobin A1C No results for input(s): HGBA1C in the last 72 hours. Fasting Lipid Panel Recent Labs    02/04/20 0405  CHOL 113  HDL 28*  LDLCALC 63  TRIG 109  CHOLHDL 4.0   Thyroid Function Tests Recent Labs    02/04/20 1146  TSH 3.797    Other results:   Imaging     No results found.   Medications:     Scheduled Medications: . allopurinol  100 mg Oral Daily  . amiodarone  200 mg Oral Daily  . aspirin  81 mg Oral Pre-Cath  . [START ON 02/06/2020] aspirin  81 mg Oral Daily  .  bisoprolol  5 mg Oral Once per day on Sun Tue Thu Sat  . calcitRIOL  0.25 mcg Oral Q M,W,F-HD  . Chlorhexidine Gluconate Cloth  6 each Topical Q0600  . cinacalcet  30 mg Oral Q supper  . ferric citrate  420 mg Oral TID WC  . heparin  2,000 Units Dialysis Once in dialysis  . [START ON 02/06/2020] heparin  2,000 Units Dialysis Once in dialysis  . midodrine  15 mg Oral Q M,W,F-HD  . multivitamin  1 tablet Oral QHS  . rosuvastatin  10 mg Oral Daily  . sodium chloride flush  3 mL Intravenous Once  . sodium chloride flush  3 mL Intravenous Q12H     Infusions: . sodium chloride    . heparin 1,250 Units/hr (02/04/20 2128)     PRN Medications:  acetaminophen, alum & mag hydroxide-simeth, fluticasone, HYDROcodone-acetaminophen, methocarbamol, nitroGLYCERIN, ondansetron (ZOFRAN) IV   Assessment/Plan   1. CAD: Cardiogenic shock in setting  of atrial flutter with RVR in 03/2019. Probable CTO mid LAD with extensive disease in other arteries.Patient taken urgently for CABG supported by Impella. 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 Kipp Brood (unable to graft LAD, LIMA-D2, SVG-OM2, SVG-PDA). This admission seemed more like CHF exacerbation with inadequate dialysis (primarily dyspnea and volume overload), but HS-TnI up to 3226 => 4246 concerning for NSTEMI/true ACS.  No changes on ECG.  He has mild atypical chest pain.  - Continue ASA 81.  -Continue statin.  - Hold Eliquis, continue heparin gtt.  - Will plan coronary angiography this afternoon after allowing some Eliquis washout (had last dose yesterday morning).  We discussed risks/benefits with patient, he agreed to procedure.  2. Acute on chronic systolic CHF: Echo with EF in 30% range with peri-apical severe akinesis in 9/20. Ischemic cardiomyopathy. Now s/p CABG.TEE in 3/21 with EF 35%, periapical akinesis, mildly decreased RV function, moderate MR, mild AS.He is volume overloaded with NYHA class IIIb symptoms.  He signs off dialysis early due to cramping and nausea, missed HD prior to admission.  He had HD yesterday and again today, still appears volume overloaded.   - Continue bisoprolol 5 mg on non-HD days. - BP too low to add additional cardiomyopathy meds.  - He gets midodrine pre-HD, continue. BP does not appear to be low on non-HD days.  - Needs HD for volume removal, getting today.  3. Atrial fibrillation/flutter: NSR on EKG. No palpitations.  -Will keep on amiodarone 200 mg daily for now with NSTEMI, had planned to stop eventually.  - On eliquis 5 mg twice a day => hold for cath and cover with heparin gtt.  4. ESRD: HD MWF. 5. Aortic stenosis: Mild on TEE in 3/21.  6. Mitral regurgitation: Moderate MR on 3/21 TEE.   Length of Stay: 0  Loralie Champagne, MD  02/05/2020, 9:28 AM  Advanced Heart Failure Team Pager [phone] (M-F; 7a - 4p)  Please contact [facility] for night-coverage after hours (4p -7a ) and weekends on [email or web address]

## 2020-02-05 NOTE — Interval H&P Note (Signed)
History and Physical Interval Note:  02/05/2020 1:28 PM  Alex Williams  has presented today for surgery, with the diagnosis of nonstemi.  The various methods of treatment have been discussed with the patient and family. After consideration of risks, benefits and other options for treatment, the patient has consented to  Procedure(s): LEFT HEART CATH AND CORS/GRAFTS ANGIOGRAPHY (N/A) as a surgical intervention.  The patient's history has been reviewed, patient examined, no change in status, stable for surgery.  I have reviewed the patient's chart and labs.  Questions were answered to the patient's satisfaction.     Joshuwa Vecchio [employer]

## 2020-02-05 NOTE — Progress Notes (Signed)
RT set up CPAP and placed on patient. Patient tolerating settings well at this time. RT will monitor as needed.

## 2020-02-05 NOTE — Progress Notes (Addendum)
PROGRESS NOTE    Patient: Alex Williams                            PCP: [name], MD                    DOB: [birth year]            DOA: 02/03/2020 [id number]             DOS: 02/05/2020, 10:59 AM   LOS: 0 days   Date of Service: The patient was seen and examined on 02/05/2020  Subjective:  The patient was seen and examined this morning, stable under hemodialysis.  Tolerating it well. Reporting still was having minimal chest pain overnight, but denies any shortness of breath. Continue to feel better on dialysis. No further issues overnight.  Anticipating patient to go for cardiac cath, angioplasty post hemodialysis today.   Brief Narrative:   Mr. Antonio Creswell 69 year old male with past medical history of end-stage renal disease(HD MWF), hypertension, hyperlipidemia, coronary artery disease (S/P STEMI and CABG 07/6382), systolic congestive heart failure( EF 35% via TEE 09/2019) chronic atrial fibrillation (on eliquis), obstructive sleep apnea who presents to ED with complaints of dyspnea on exertion and chest discomfort.   Assessment & Plan:   Principal Problem:   Chest pain Active Problems:   Essential hypertension   Coronary artery disease   ESRD on dialysis [facility])   Atrial fibrillation, chronic (HCC)   GERD (gastroesophageal reflux disease)   Acute on chronic systolic CHF (congestive heart failure) (HCC)   CHF (congestive heart failure) (HCC)   Non-STEMI -versus ischemic demand Chest pain-with a history of coronary artery disease -Still reporting minimal chest pain overnight, but has resolved this morning. -Cardiology following, anticipating cardiac catheterization, angioplasty today post hemodialysis.  -Possible non-STEMI, ischemic demand, in the setting of volume overload, hemodialysis, end-stage renal disease -Cardiology consulted -appreciate evaluation and recommendation -Troponin elevated 108, 119, >> 452 >>> 4,240 -Continue cardiac monitoring- -Continue  supportive measures:  - continue beta-blockers, statins, as needed nitroglycerin, morphine, -N.p.o. till further evaluation by cardiology Home medication Eliquis on hold, on heparin drip --in anticipation of cardiac cath, angioplasty today    Pulmonary embolism unlikely due to patient's ongoing Eliquis use  CT chest reveals small bilateral pleural effusions with no evidence of pneumonia or fracture.  Active Problems:  Acute on chronic systolic CHF (congestive heart failure) ([place]) -Noted for volume overload-CHF exacerbation -on hemodialysis today -Improved dyspnea -Paroxysmal nocturnal dyspnea with report of orthopnea -Likely will need further volume extraction to hemodialysis -Patient still able to void, Lasix IV was initiated overnight    Known significant reduction in ejection fraction of 30 to 35% via TEE in March 2021. Marland Kitchen  Chest x-ray and CT scan reveal no evidence of pneumonia  Pulmonary embolism unlikely considering ongoing Eliquis use  H/o hypotension - BP stable -Monitoring closely -Continue home medication of midodrine       ESRD on dialysis [facility]) -Hemodialysis today 02/05/2020 -Dyspnea, bilateral lower extremity edema -Volume overload -anticipating volume extraction  hemodialysis -Nephrology consulted, anticipating hemodialysis today and tomorrow     Atrial fibrillation, chronic (HCC)   Continue home regimen of amiodarone  Continue home regimen of beta-blocker  Continue home regimen of Eliquis  Monitoring, currently rate well controlled  Cardiology consulted appreciate input   Anemia -anemia of chronic disease -Monitoring H&H closely - hgb 9.8 >>8.2 >>9.0  today  OSA -  reports full compliance w/ home CPAP - order CPAP qhs while inpatient   Morbid Obesity  - Body mass index is 39.47 kg/m.  -Patient was advised    Aortic Stenosis/severe MR - TEE 3/21: moderately calcified and trileaflet. Mild AS with mean gradient 18 mmHg and  AVA 1.7 cm^2. No AI  -Cardiology following -Continue current meds  Consultants:  Cardiology Nephrology PT/OT   --------------------------------------------------------------------------------------------------------------------------------- DVT prophylaxis:  JI:RCVELFY Code Status:   Code Status: Full Code Family Communication: No family member present at bedside The above findings and plan of care has been discussed with patient   in detail,  they expressed understanding and agreement of above. -Advance care planning has been discussed.   Admission status:    Status is: Will be switched to inpatient-patient meets the inpatient criteria due to non-STEMI, volume overload needing hemodialysis, cardiac evaluation    Dispo: The patient is from: Home              Anticipated d/c is to: Home              Anticipated d/c date is: 2 days              Patient currently is not medically stable to d/c.        Procedures:   No admission procedures for hospital encounter.     Antimicrobials:  Anti-infectives (From admission, onward)   None       Medication:  . allopurinol  100 mg Oral Daily  . amiodarone  200 mg Oral Daily  . aspirin  81 mg Oral Pre-Cath  . [START ON 02/06/2020] aspirin  81 mg Oral Daily  . bisoprolol  5 mg Oral Once per day on Sun Tue Thu Sat  . calcitRIOL  0.25 mcg Oral Q M,W,F-HD  . Chlorhexidine Gluconate Cloth  6 each Topical Q0600  . cinacalcet  30 mg Oral Q supper  . ferric citrate  420 mg Oral TID WC  . heparin  2,000 Units Dialysis Once in dialysis  . [START ON 02/06/2020] heparin  2,000 Units Dialysis Once in dialysis  . midodrine  15 mg Oral Q M,W,F-HD  . multivitamin  1 tablet Oral QHS  . rosuvastatin  10 mg Oral Daily  . sodium chloride flush  3 mL Intravenous Once  . sodium chloride flush  3 mL Intravenous Q12H    acetaminophen, alum & mag hydroxide-simeth, fluticasone, HYDROcodone-acetaminophen, methocarbamol, nitroGLYCERIN,  ondansetron (ZOFRAN) IV   Objective:   Vitals:   02/05/20 0900 02/05/20 0930 02/05/20 1000 02/05/20 1030  BP: 127/74 (!) 99/56 (!) 102/52 (!) 101/58  Pulse: 61 62 60 62  Resp:      Temp:      TempSrc:      SpO2:      Weight:      Height:        Intake/Output Summary (Last 24 hours) at 02/05/2020 1059 Last data filed at 02/05/2020 0300 Gross per 24 hour  Intake 69.08 ml  Output 2655 ml  Net -2585.92 ml   Filed Weights   02/03/20 1617 02/05/20 0733  Weight: 132 kg 127.6 kg     Examination:    Physical Exam  Constitution:  Alert, cooperative, no distress,  Psychiatric: Normal and stable mood and affect, cognition intact,   HEENT: Normocephalic, PERRL, otherwise with in Normal limits  Chest:Chest symmetric Cardio vascular:  S1/S2, RRR, No murmure, No Rubs or Gallops  pulmonary: Clear to auscultation bilaterally, respirations unlabored, negative wheezes /  crackles Abdomen: Soft, non-tender, non-distended, bowel sounds,no masses, no organomegaly Muscular skeletal: Limited exam - in bed, able to move all 4 extremities, Normal strength,  Neuro: CNII-XII intact. , normal motor and sensation, reflexes intact  Extremities: +2 pitting edema lower extremities, +2 pulses  Skin: Dry, warm to touch, negative for any Rashes, No open wounds Wounds: per nursing documentation      ------------------------------------------------------------------------------------------------------------------------------------------    LABs:  CBC Latest Ref Rng & Units 02/05/2020 02/04/2020 02/03/2020  WBC 4.0 - 10.5 K/uL 8.6 11.1(H) 13.5(H)  Hemoglobin 13.0 - 17.0 g/dL 9.0(L) 9.2(L) 9.8(L)  Hematocrit 39 - 52 % 29.2(L) 30.5(L) 32.9(L)  Platelets 150 - 400 K/uL 180 174 194   CMP Latest Ref Rng & Units 02/05/2020 02/04/2020 02/03/2020  Glucose 70 - 99 mg/dL 83 77 98  BUN 8 - 23 mg/dL 42(H) 69(H) 61(H)  Creatinine 0.61 - 1.24 mg/dL 6.85(H) 9.59(H) 8.99(H)  Sodium 135 - 145 mmol/L 138 140 139    Potassium 3.5 - 5.1 mmol/L 3.9 4.3 4.4  Chloride 98 - 111 mmol/L 98 98 96(L)  CO2 22 - 32 mmol/L 25 24 26   Calcium 8.9 - 10.3 mg/dL 10.5(H) 11.1(H) 11.5(H)  Total Protein 6.5 - 8.1 g/dL - 5.7(L) -  Total Bilirubin 0.3 - 1.2 mg/dL - 1.4(H) -  Alkaline Phos 38 - 126 U/L - 77 -  AST 15 - 41 U/L - 27 -  ALT 0 - 44 U/L - 9 -       Micro Results Recent Results (from the past 240 hour(s))  SARS Coronavirus 2 by RT PCR (hospital order, performed in [facility] hospital lab) Nasopharyngeal Nasopharyngeal Swab     Status: None   Collection Time: 02/03/20 11:27 PM   Specimen: Nasopharyngeal Swab  Result Value Ref Range Status   SARS Coronavirus 2 NEGATIVE NEGATIVE Final    Comment: (NOTE) SARS-CoV-2 target nucleic acids are NOT DETECTED.  [facility], [place] [address] [place], [place] [address]     Radiology Reports DG Chest 2 View  Result Date: 02/03/2020 CLINICAL DATA:  Dyspnea on exertion for 3 months, previous tobacco abuse EXAM: CHEST - 2 VIEW COMPARISON:  12/28/2019 FINDINGS: Frontal and lateral views of the chest demonstrates stable enlargement of the cardiac silhouette. Postsurgical changes from prior bypass surgery. There is chronic central vascular congestion. Retrocardiac consolidation may reflect atelectasis. No effusion or pneumothorax. No acute bony abnormalities. IMPRESSION: 1. Chronic central vascular congestion. 2. Retrocardiac consolidation favor atelectasis. 3. Stable cardiomegaly. Electronically Signed   By: Randa Ngo M.D.   On: 02/03/2020 16:56   CT Chest Wo Contrast  Result Date: 02/04/2020 CLINICAL DATA:  69 year old male with increasing shortness of breath. Missed dialysis. Retrocardiac opacity on chest radiographs. EXAM: CT CHEST WITHOUT CONTRAST TECHNIQUE: Multidetector CT imaging of the chest was performed following the standard protocol without IV contrast. COMPARISON:  Chest radiographs earlier tonight. Chest CT 01/28/2010. FINDINGS:  Cardiovascular: Calcified aortic atherosclerosis. Prior CABG. Cardiomegaly is increased since 2011. Small volume pericardial effusion with simple fluid density (series 3, image 106). Vascular patency is not evaluated in the absence of IV contrast. Mediastinum/Nodes: Negative.  No mediastinal lymphadenopathy. Lungs/Pleura: Small bilateral layering pleural effusions. Simple fluid density favoring transudate. Fluid mildly tracks into the left major fissure. Associated compressive atelectasis. Major airways are patent. Occasional superimposed faint upper lobe subpleural densities which are probably mild scarring. Upper Abdomen: Cholelithiasis. No pericholecystic inflammation is evident. Negative visible noncontrast liver, spleen, pancreas, and bowel in the upper  abdomen. There is new low-density left adrenal gland thickening since 2011 which could be adrenal hyperplasia or benign adenoma. Musculoskeletal: Prior sternotomy. 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 No acute osseous abnormality identified. IMPRESSION: 1. Small layering pleural effusions with compressive atelectasis. 2. Cardiomegaly with small volume pericardial effusion. Prior CABG. Aortic Atherosclerosis (ICD10-I70.0). 3. Cholelithiasis. Electronically Signed   By: Genevie Ann M.D.   On: 02/04/2020 00:50   VAS [place] LOWER EXTREMITY VENOUS (DVT) (ONLY [facility] & [facility])  Result Date: 01/21/2020  Lower Venous DVTStudy Indications: Worsening erythema and pain on thigh, erythema of calf x 1 month.  Other Factors: Cellulitis on antibiotics, Hx GSV harvest. Limitations: Body habitus and patient pain level. Comparison Study: 12/28/19 negative Performing Technologist: June Leap RDMS, RVT  Examination Guidelines: A complete evaluation includes B-mode imaging, spectral Doppler, color Doppler, and power Doppler as needed of all accessible portions of each vessel. Bilateral testing is considered an integral part of a  complete examination. Limited examinations for reoccurring indications may be performed as noted. The reflux portion of the exam is performed with the patient in reverse Trendelenburg. Summary: RIGHT: - There is no evidence of deep vein thrombosis in the lower extremity.  - No cystic structure found in the popliteal fossa.   *See table(s) above for measurements and observations. Electronically signed by Servando Snare MD on 01/21/2020 at 4:32:00 PM.    Final    SIGNED: Deatra James, MD, FACP, FHM. [facility],  Pager (please use [email or web address] to page/text)  If 7PM-7AM, please contact night-coverage [email or web address], Password [facility] 02/05/2020, 10:59 AM

## 2020-02-05 NOTE — Procedures (Signed)
Patient seen on Hemodialysis. BP 127/74 (BP Location: Left Arm)   Pulse 61   Temp 97.8 F (36.6 C) (Oral)   Resp 18   Ht 6' (1.829 m)   Wt 127.6 kg   SpO2 94%   BMI 38.15 kg/m   QB 400, UF goal 3L. 2+ bilateral LE edema. Tolerating treatment without complaints at this time. Anticipated coronary angiogram this afternoon after HD.   Elmarie Shiley MD [facility]. Office # [phone] 9:42 AM

## 2020-02-05 NOTE — Progress Notes (Signed)
Patient transferred from cath lab at 1456hrs.  5 French sheath intact right femoral site level zero.  Sheath removed and pressure held for 20 min.  Site remains level zero, tegaderm with gauze in place.  +2 DP pulse.  Patient given post cath instructions, verbalized understanding.

## 2020-02-05 NOTE — Progress Notes (Signed)
Patient ID: [name], male   DOB: Oct 09, 1950, 69 y.o.   MRN: [id number]     Advanced Heart Failure Rounding Note  PCP-Cardiologist: No primary care provider on file.   Subjective:    No complaints this morning on dialysis.  He has had mild atypical chest pain on and off for 2 months he says. Main issues has been exertional dyspnea.     Objective:   Weight Range: 127.6 kg Body mass index is 38.15 kg/m.   Vital Signs:   Temp:  [97.6 F (36.4 C)-98.5 F (36.9 C)] 97.8 F (36.6 C) (07/16 0733) Pulse Rate:  [47-77] 61 (07/16 0900) Resp:  [12-18] 18 (07/16 0733) BP: (90-127)/(44-88) 127/74 (07/16 0900) SpO2:  [94 %-100 %] 94 % (07/16 0733) Weight:  [127.6 kg] 127.6 kg (07/16 0733)    Weight change: Filed Weights   02/03/20 1617 02/05/20 0733  Weight: 132 kg 127.6 kg    Intake/Output:   Intake/Output Summary (Last 24 hours) at 02/05/2020 0928 Last data filed at 02/05/2020 0300 Gross per 24 hour  Intake 69.08 ml  Output 2655 ml  Net -2585.92 ml      Physical Exam    General:  Well appearing. No resp difficulty HEENT: Normal Neck: Supple. JVP 12 cm. Carotids 2+ bilat; no bruits. No lymphadenopathy or thyromegaly appreciated. Cor: PMI nondisplaced. Regular rate & rhythm. No rubs, gallops or murmurs. Lungs: Clear Abdomen: Soft, nontender, nondistended. No hepatosplenomegaly. No bruits or masses. Good bowel sounds. Extremities: No cyanosis, clubbing, rash.  1+ ankle edema.  Neuro: Alert & orientedx3, cranial nerves grossly intact. moves all 4 extremities w/o difficulty. Affect pleasant   Telemetry   NSR, personally reviewed  Labs    CBC Recent Labs    02/04/20 0405 02/05/20 0439  WBC 11.1* 8.6  NEUTROABS 8.2*  --   HGB 9.2* 9.0*  HCT 30.5* 29.2*  MCV 90.8 91.0  PLT 174 465   Basic Metabolic Panel Recent Labs    02/04/20 0405 02/05/20 0439  NA 140 138  K 4.3 3.9  CL 98 98  CO2 24 25  GLUCOSE 77 83  BUN 69* 42*  CREATININE 9.59* 6.85*    CALCIUM 11.1* 10.5*  MG 2.4  --    Liver Function Tests Recent Labs    02/04/20 0405  AST 27  ALT 9  ALKPHOS 77  BILITOT 1.4*  PROT 5.7*  ALBUMIN 2.7*   No results for input(s): LIPASE, AMYLASE in the last 72 hours. Cardiac Enzymes No results for input(s): CKTOTAL, CKMB, CKMBINDEX, TROPONINI in the last 72 hours.  BNP: BNP (last 3 results) Recent Labs    02/05/19 0951  BNP 1,186.2*    ProBNP (last 3 results) No results for input(s): PROBNP in the last 8760 hours.   D-Dimer No results for input(s): DDIMER in the last 72 hours. Hemoglobin A1C No results for input(s): HGBA1C in the last 72 hours. Fasting Lipid Panel Recent Labs    02/04/20 0405  CHOL 113  HDL 28*  LDLCALC 63  TRIG 109  CHOLHDL 4.0   Thyroid Function Tests Recent Labs    02/04/20 1146  TSH 3.797    Other results:   Imaging     No results found.   Medications:     Scheduled Medications: . allopurinol  100 mg Oral Daily  . amiodarone  200 mg Oral Daily  . aspirin  81 mg Oral Pre-Cath  . [START ON 02/06/2020] aspirin  81 mg Oral Daily  .  bisoprolol  5 mg Oral Once per day on Sun Tue Thu Sat  . calcitRIOL  0.25 mcg Oral Q M,W,F-HD  . Chlorhexidine Gluconate Cloth  6 each Topical Q0600  . cinacalcet  30 mg Oral Q supper  . ferric citrate  420 mg Oral TID WC  . heparin  2,000 Units Dialysis Once in dialysis  . [START ON 02/06/2020] heparin  2,000 Units Dialysis Once in dialysis  . midodrine  15 mg Oral Q M,W,F-HD  . multivitamin  1 tablet Oral QHS  . rosuvastatin  10 mg Oral Daily  . sodium chloride flush  3 mL Intravenous Once  . sodium chloride flush  3 mL Intravenous Q12H     Infusions: . sodium chloride    . heparin 1,250 Units/hr (02/04/20 2128)     PRN Medications:  acetaminophen, alum & mag hydroxide-simeth, fluticasone, HYDROcodone-acetaminophen, methocarbamol, nitroGLYCERIN, ondansetron (ZOFRAN) IV   Assessment/Plan   1. CAD: Cardiogenic shock in setting  of atrial flutter with RVR in 03/2019. Probable CTO mid LAD with extensive disease in other arteries.Patient taken urgently for CABG supported by [facility]. 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 Kipp Brood (unable to graft LAD, LIMA-D2, SVG-OM2, SVG-PDA). This admission seemed more like CHF exacerbation with inadequate dialysis (primarily dyspnea and volume overload), but HS-TnI up to 3226 => 4246 concerning for NSTEMI/true ACS.  No changes on ECG.  He has mild atypical chest pain.  - Continue ASA 81.  -Continue statin.  - Hold Eliquis, continue heparin gtt.  - Will plan coronary angiography this afternoon after allowing some Eliquis washout (had last dose yesterday morning).  We discussed risks/benefits with patient, he agreed to procedure.  2. Acute on chronic systolic CHF: Echo with EF in 30% range with peri-apical severe akinesis in 9/20. Ischemic cardiomyopathy. Now s/p CABG.TEE in 3/21 with EF 35%, periapical akinesis, mildly decreased RV function, moderate MR, mild AS.He is volume overloaded with NYHA class IIIb symptoms.  He signs off dialysis early due to cramping and nausea, missed HD prior to admission.  He had HD yesterday and again today, still appears volume overloaded.   - Continue bisoprolol 5 mg on non-HD days. - BP too low to add additional cardiomyopathy meds.  - He gets midodrine pre-HD, continue. BP does not appear to be low on non-HD days.  - Needs HD for volume removal, getting today.  3. Atrial fibrillation/flutter: NSR on EKG. No palpitations.  -Will keep on amiodarone 200 mg daily for now with NSTEMI, had planned to stop eventually.  - On eliquis 5 mg twice a day => hold for cath and cover with heparin gtt.  4. ESRD: HD MWF. 5. Aortic stenosis: Mild on TEE in 3/21.  6. Mitral regurgitation: Moderate MR on 3/21 TEE.   Length of Stay: 0  Loralie Champagne, MD  02/05/2020, 9:28 AM  Advanced Heart Failure Team Pager [phone] (M-F; 7a - 4p)  Please contact [facility] for night-coverage after hours (4p -7a ) and weekends on [email or web address]

## 2020-02-05 NOTE — Progress Notes (Signed)
[place] for Heparin Indication: chest pain/ACS  Allergies  Allergen Reactions  . Codeine Phosphate Other (See Comments)    Hyperactive     Patient Measurements: Height: 6' (182.9 cm) Weight: 132 kg (291 lb) IBW/kg (Calculated) : 77.6 Heparin Dosing Weight: 107.5 kg  Vital Signs: Temp: 98.3 F (36.8 C) (07/16 0417) Temp Source: Oral (07/16 0417) BP: 105/70 (07/16 0417) Pulse Rate: 59 (07/16 0417)  Labs: Recent Labs    02/03/20 1644 02/03/20 2204 02/04/20 0405 02/04/20 0405 02/04/20 1146 02/04/20 1806 02/04/20 1939 02/05/20 0439  HGB 9.8*  --  9.2*  --   --   --   --  9.0*  HCT 32.9*  --  30.5*  --   --   --   --  29.2*  PLT 194  --  174  --   --   --   --  180  APTT  --   --   --   --   --   --   --  69*  CREATININE 8.99*  --  9.59*  --   --   --   --  6.85*  TROPONINIHS 108*   < > 452*   < > 3,226* 4,783* 4,246*  --    < > = values in this interval not displayed.    Estimated Creatinine Clearance: 14.3 mL/min (A) (by C-G formula based on SCr of 6.85 mg/dL (H)).   Assessment: Patient is a 81 yom that is being seen for an acute on chronic combined systolic and diastolic heart failure. After evaluation of the patient the patient's trop has been trending upward and there are concerns for an NSTEMI. The plan is for the patient to have a cath tomorrow and pharmacy has been asked to start heparin while they hold the patient's apixaban for the cath.   PTT 69 sec (therapeutic) on gtt at 1250 units/hr.   Goal of Therapy:  aPTT 66-102 seconds Monitor platelets by anticoagulation protocol: Yes   Plan:  Continue heparin at 1250 units/hr Will f/u post cath If heparin to continue, consider checking heparin level (likely falsely elevated due to apixaban but none checked so far)  Sherlon Handing, PharmD, BCPS Please see [email or web address] for complete clinical pharmacist phone list  02/05/2020,5:35 AM

## 2020-02-05 NOTE — Progress Notes (Signed)
  After cath, patient was noted to have gone into an atrial tachycardia with rate in 90s (baseline rate had been 60s).  BP stable.  Confirmed by ECG.  Will give amiodarone 250 mg IV bolus x 1 and follow.   Loralie Champagne 02/05/2020 2:39 PM

## 2020-02-06 DIAGNOSIS — I959 Hypotension, unspecified: Secondary | ICD-10-CM

## 2020-02-06 LAB — COMPREHENSIVE METABOLIC PANEL
ALT: 13 U/L (ref 0–44)
AST: 34 U/L (ref 15–41)
Albumin: 2.8 g/dL — ABNORMAL LOW (ref 3.5–5.0)
Alkaline Phosphatase: 87 U/L (ref 38–126)
Anion gap: 14 (ref 5–15)
BUN: 29 mg/dL — ABNORMAL HIGH (ref 8–23)
CO2: 25 mmol/L (ref 22–32)
Calcium: 10.9 mg/dL — ABNORMAL HIGH (ref 8.9–10.3)
Chloride: 100 mmol/L (ref 98–111)
Creatinine, Ser: 6.29 mg/dL — ABNORMAL HIGH (ref 0.61–1.24)
GFR calc Af Amer: 10 mL/min — ABNORMAL LOW (ref >60)
GFR calc non Af Amer: 8 mL/min — ABNORMAL LOW (ref >60)
Glucose, Bld: 106 mg/dL — ABNORMAL HIGH (ref 70–99)
Potassium: 3.5 mmol/L (ref 3.5–5.1)
Sodium: 139 mmol/L (ref 135–145)
Total Bilirubin: 1.3 mg/dL — ABNORMAL HIGH (ref 0.3–1.2)
Total Protein: 5.5 g/dL — ABNORMAL LOW (ref 6.5–8.1)

## 2020-02-06 LAB — CBC WITH DIFFERENTIAL/PLATELET
Abs Immature Granulocytes: 0.02 10*3/uL (ref 0.00–0.07)
Basophils Absolute: 0.1 10*3/uL (ref 0.0–0.1)
Basophils Relative: 1 %
Eosinophils Absolute: 0.1 10*3/uL (ref 0.0–0.5)
Eosinophils Relative: 1 %
HCT: 30.9 % — ABNORMAL LOW (ref 39.0–52.0)
Hemoglobin: 9.3 g/dL — ABNORMAL LOW (ref 13.0–17.0)
Immature Granulocytes: 0 %
Lymphocytes Relative: 22 %
Lymphs Abs: 1.6 10*3/uL (ref 0.7–4.0)
MCH: 27.7 pg (ref 26.0–34.0)
MCHC: 30.1 g/dL (ref 30.0–36.0)
MCV: 92 fL (ref 80.0–100.0)
Monocytes Absolute: 0.8 10*3/uL (ref 0.1–1.0)
Monocytes Relative: 12 %
Neutro Abs: 4.6 10*3/uL (ref 1.7–7.7)
Neutrophils Relative %: 64 %
Platelets: 202 10*3/uL (ref 150–400)
RBC: 3.36 MIL/uL — ABNORMAL LOW (ref 4.22–5.81)
RDW: 21.4 % — ABNORMAL HIGH (ref 11.5–15.5)
WBC: 7.1 10*3/uL (ref 4.0–10.5)
nRBC: 0 % (ref 0.0–0.2)

## 2020-02-06 LAB — PHOSPHORUS: Phosphorus: 4.8 mg/dL — ABNORMAL HIGH (ref 2.5–4.6)

## 2020-02-06 LAB — MAGNESIUM: Magnesium: 2.2 mg/dL (ref 1.7–2.4)

## 2020-02-06 MED ORDER — HEPARIN SODIUM (PORCINE) 1000 UNIT/ML IJ SOLN
INTRAMUSCULAR | Status: AC
  Administered 2020-02-06: 2000 [IU]
  Filled 2020-02-06: qty 2

## 2020-02-06 MED ORDER — CHLORHEXIDINE GLUCONATE CLOTH 2 % EX PADS
6.0000 | MEDICATED_PAD | Freq: Every day | CUTANEOUS | Status: DC
  Administered 2020-02-06 – 2020-02-07 (×2): 6 via TOPICAL

## 2020-02-06 MED ORDER — TEMAZEPAM 7.5 MG PO CAPS
7.5000 mg | ORAL_CAPSULE | Freq: Once | ORAL | Status: AC
  Administered 2020-02-06: 7.5 mg via ORAL
  Filled 2020-02-06: qty 1

## 2020-02-06 NOTE — Progress Notes (Signed)
Patient ID: Alex Williams, male   DOB: [birth year], 69 y.o.   MRN: [id number]     Advanced Heart Failure Rounding Note  PCP-Cardiologist: No primary care provider on file.   Subjective:    Breathing has improved with aggressive dialysis.  Still with some dyspnea walking to bathroom.  No chest pain.   He had run of ectopic atrial rhythm in 90s yesterday after cath, but back in NSR this morning.   LHC 7/16: Coronary Findings  Diagnostic Dominance: Right Left Main  95% stenosis distal left main.  Left Anterior Descending  Totally occluded proximal LAD. Patent LIMA-D2. This backfills to the LAD, but the native LAD is severely diseased both proximally and distally to D2 take-off.  Ramus Intermedius  Small to moderate vessel, patent off native circulation.  Left Circumflex  Totally occluded proximal LCx. Patent SVG-PLOM. This vessel backfills the LCx and a small OM. The PLOM distal to the SVG touchdown is diminutive, think subtotally occluded.  Right Coronary Artery  40% proximal RCA, 50% distal RCA. 90% stenosis proximal PDA. Diffuse moderate disease in the PLV. Patent SVG-PDA.      Objective:   Weight Range: 123.6 kg Body mass index is 36.94 kg/m.   Vital Signs:   Temp:  [97.6 F (36.4 C)-98.8 F (37.1 C)] 98.8 F (37.1 C) (07/17 0535) Pulse Rate:  [0-99] 71 (07/17 0535) Resp:  [8-103] 17 (07/17 0535) BP: (89-125)/(47-90) 112/73 (07/17 0535) SpO2:  [0 %-100 %] 100 % (07/17 0535) Weight:  [123.6 kg-124.2 kg] 123.6 kg (07/17 0535) Last BM Date: 02/05/20  Weight change: Filed Weights   02/05/20 0733 02/05/20 1127 02/06/20 0535  Weight: 127.6 kg 124.2 kg 123.6 kg    Intake/Output:   Intake/Output Summary (Last 24 hours) at 02/06/2020 1014 Last data filed at 02/05/2020 1800 Gross per 24 hour  Intake 480 ml  Output 3200 ml  Net -2720 ml      Physical Exam    General: NAD Neck: JVP 8-9 cm, no thyromegaly or thyroid nodule.  Lungs: Clear to auscultation  bilaterally with normal respiratory effort. CV: Nondisplaced PMI.  Heart regular S1/S2, no S3/S4, no murmur.  1+ ankle edema. Abdomen: Soft, nontender, no hepatosplenomegaly, no distention.  Skin: Intact without lesions or rashes.  Neurologic: Alert and oriented x 3.  Psych: Normal affect. Extremities: No clubbing or cyanosis. Right groin cath site benign.  HEENT: Normal.    Telemetry   NSR, personally reviewed  Labs    CBC Recent Labs    02/04/20 0405 02/05/20 0439  WBC 11.1* 8.6  NEUTROABS 8.2*  --   HGB 9.2* 9.0*  HCT 30.5* 29.2*  MCV 90.8 91.0  PLT 174 932   Basic Metabolic Panel Recent Labs    02/04/20 0405 02/05/20 0439  NA 140 138  K 4.3 3.9  CL 98 98  CO2 24 25  GLUCOSE 77 83  BUN 69* 42*  CREATININE 9.59* 6.85*  CALCIUM 11.1* 10.5*  MG 2.4  --    Liver Function Tests Recent Labs    02/04/20 0405  AST 27  ALT 9  ALKPHOS 77  BILITOT 1.4*  PROT 5.7*  ALBUMIN 2.7*   No results for input(s): LIPASE, AMYLASE in the last 72 hours. Cardiac Enzymes No results for input(s): CKTOTAL, CKMB, CKMBINDEX, TROPONINI in the last 72 hours.  BNP: BNP (last 3 results) No results for input(s): BNP in the last 8760 hours.  ProBNP (last 3 results) No results for input(s): PROBNP in the  last 8760 hours.   D-Dimer No results for input(s): DDIMER in the last 72 hours. Hemoglobin A1C No results for input(s): HGBA1C in the last 72 hours. Fasting Lipid Panel Recent Labs    02/04/20 0405  CHOL 113  HDL 28*  LDLCALC 63  TRIG 109  CHOLHDL 4.0   Thyroid Function Tests Recent Labs    02/04/20 1146  TSH 3.797    Other results:   Imaging    CARDIAC CATHETERIZATION  Result Date: 02/05/2020 1. Patent bypass grafts with no interventional target. 2. There are sources for ischemia => PLV branch circulation, PLOM distal to the SVG-PLOM touchdown, mid to distal native LAD territory.  Therefore, anginal chest pain could certainly occur. 3. I suspect that the  event triggering admission was likely volume overload from inadequate dialysis, this led to LV stretch with demand ischemia and elevation in HS-TnI.     Medications:     Scheduled Medications: . allopurinol  100 mg Oral Daily  . amiodarone  200 mg Oral Daily  . apixaban  5 mg Oral BID  . aspirin  81 mg Oral Daily  . bisoprolol  5 mg Oral Once per day on Sun Tue Thu Sat  . Chlorhexidine Gluconate Cloth  6 each Topical Q0600  . cinacalcet  30 mg Oral Q supper  . ferric citrate  420 mg Oral TID WC  . midodrine  15 mg Oral Q M,W,F-HD  . multivitamin  1 tablet Oral QHS  . rosuvastatin  10 mg Oral Daily  . sodium chloride flush  3 mL Intravenous Once  . sodium chloride flush  3 mL Intravenous Q12H  . sodium chloride flush  3 mL Intravenous Q12H    Infusions: . sodium chloride      PRN Medications: sodium chloride, acetaminophen, alum & mag hydroxide-simeth, fluticasone, HYDROcodone-acetaminophen, methocarbamol, nitroGLYCERIN, ondansetron (ZOFRAN) IV, sodium chloride flush   Assessment/Plan   1. CAD: Cardiogenic shock in setting of atrial flutter with RVR in 03/2019. Probable CTO mid LAD with extensive disease in other arteries.Patient taken urgently for CABG supported by [facility]. 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 Kipp Brood (unable to graft LAD, LIMA-D2, SVG-OM2, SVG-PDA). This admission seemed more like CHF exacerbation with inadequate dialysis (primarily dyspnea and volume overload), but HS-TnI up to 3226 => 4246 concerning for NSTEMI/true ACS.  Cath was done on 7/16 showing patent bypass grafts with no interventional target.  There are sources for ischemia => PLV branch circulation, PLOM distal to the SVG-PLOM touchdown, mid to distal native LAD territory.  Therefore, anginal chest pain could certainly occur. However, I suspect that the event triggering admission was likely volume overload from inadequate dialysis, this led to LV stretch with demand ischemia and elevation in HS-TnI.  -  At this point, can stop ASA with Eliquis use.  -Continue statin.  - Restarting Eliquis.  2. Acute on chronic systolic CHF: Echo with EF in 30% range with peri-apical severe akinesis in 9/20. Ischemic cardiomyopathy. Now s/p CABG.TEE in 3/21 with EF 35%, periapical akinesis, mildly decreased RV function, moderate MR, mild AS.He was admitted volume overloaded with NYHA class IIIb symptoms.  He signs off dialysis early due to cramping and nausea, missed HD prior to admission.  He had HD 2 days in a row with increased fluid removal, now below previous dry weight.  Suspect he still has some volume overload. Dyspnea with exertion still present but improved.  - Would be reasonable to dialyze one more time over weekend to get  him to true dry weight.  Regardless, agree with lower EDW as outpatient.  - Continue bisoprolol 5 mg on non-HD days. - BP too low to add additional cardiomyopathy meds.  - He gets midodrine pre-HD, continue. BP does not appear to be low on non-HD days.  3. Atrial fibrillation/flutter: He had a run of ectopic atrial rhythm post-cath, now back in NSR.  -Continue amiodarone 200 mg daily for now.  - Restart Eliquis today.  4. ESRD: HD MWF. 5. Aortic stenosis: Mild on TEE in 3/21.  6. Mitral regurgitation: Moderate MR on 3/21 TEE.   I will sign off.  Will arrange cardiology followup.   Length of Stay: 1  Loralie Champagne, MD  02/06/2020, 10:14 AM  Advanced Heart Failure Team Pager [phone] (M-F; 7a - 4p)  Please contact [facility] for night-coverage after hours (4p -7a ) and weekends on [email or web address]

## 2020-02-06 NOTE — Progress Notes (Signed)
[facility] Progress Note   Subjective:  Underwent LHC/angiography yesterday - no targets for intervention.  Completed dialysis yesterday with 3L UF. Feels much better today. Breathing improved, got OOB and walked. No CP, SOB this am.   Objective Vitals:   02/05/20 2020 02/05/20 2223 02/06/20 0100 02/06/20 0535  BP: (!) 89/47  114/75 112/73  Pulse: 63 62 60 71  Resp: 16 16 18 17   Temp: 98.8 F (37.1 C)  97.6 F (36.4 C) 98.8 F (37.1 C)  TempSrc: Oral  Axillary Oral  SpO2: 96% 98% 98% 100%  Weight:    123.6 kg  Height:         Physical Exam General:  Obese man, lying in bed nad Heart: RRR  Lungs: Clear bilaterally Abdomen: soft non-tender, non-distended.  Extremities: no sig LE edema  Dialysis Access: RUE AVF +bruit   Additional Objective Labs: Basic Metabolic Panel: Recent Labs  Lab 02/03/20 1644 02/04/20 0405 02/05/20 0439  NA 139 140 138  K 4.4 4.3 3.9  CL 96* 98 98  CO2 26 24 25   GLUCOSE 98 77 83  BUN 61* 69* 42*  CREATININE 8.99* 9.59* 6.85*  CALCIUM 11.5* 11.1* 10.5*   CBC: Recent Labs  Lab 02/03/20 1644 02/04/20 0405 02/05/20 0439  WBC 13.5* 11.1* 8.6  NEUTROABS  --  8.2*  --   HGB 9.8* 9.2* 9.0*  HCT 32.9* 30.5* 29.2*  MCV 92.2 90.8 91.0  PLT 194 174 180   Blood Culture    Component Value Date/Time   SDES BLOOD LEFT HAND 01/21/2020 2035   SPECREQUEST  01/21/2020 2035    BOTTLES DRAWN AEROBIC AND ANAEROBIC Blood Culture adequate volume   CULT  01/21/2020 2035    NO GROWTH 5 DAYS Performed at [facility] [address], [place], [place] [address]    REPTSTATUS 01/26/2020 FINAL 01/21/2020 2035     Medications: . sodium chloride     . allopurinol  100 mg Oral Daily  . amiodarone  200 mg Oral Daily  . apixaban  5 mg Oral BID  . aspirin  81 mg Oral Daily  . bisoprolol  5 mg Oral Once per day on Sun Tue Thu Sat  . calcitRIOL  0.25 mcg Oral Q M,W,F-HD  . Chlorhexidine Gluconate Cloth  6 each Topical Q0600  .  cinacalcet  30 mg Oral Q supper  . ferric citrate  420 mg Oral TID WC  . midodrine  15 mg Oral Q M,W,F-HD  . multivitamin  1 tablet Oral QHS  . rosuvastatin  10 mg Oral Daily  . sodium chloride flush  3 mL Intravenous Once  . sodium chloride flush  3 mL Intravenous Q12H  . sodium chloride flush  3 mL Intravenous Q12H    Dialysis Orders:  MWF [facility] 4.5h 450/800 133kg 2/2 bath P4 AVF Hep 2000  mircera 75ug q2 wks (due 7/16)   Assessment/Plan: 1. NSTEMI - Known CAD s/p CABG - LHC/Angiography 7/16 -patent bypass grafts no interventional targets.  2. Dyspnea/CHF/Volume overload --pt may be losing body wt. CXR w/ early IS edema pattern. Hx CM EF 35%. BP's on the low side.  Had HD x2 to  challenge / lower the dry wt. He is now well below his outpatient dry weight. Will lower EDW  at discharge.  3. ESRD - on HD MWF.  Next HD 7/19 4. Chronic hypotension. On midodrine 10 for BP support on HD.  5. Anemia ckd - cont ESA as  outpatient.  6. MBD ckd - Ca++ elevated.  Calcitriol on hold, getting sensipar. 2 phos binders.  7. CAD sp CABG 8. Atrial fib - on BB/ eliquis/ amio  9. HL - statin  Lynnda Child PA-C [facility] 02/06/2020,8:28 AM

## 2020-02-06 NOTE — Progress Notes (Signed)
PROGRESS NOTE    AZARI HASLER  VQQ:[id number] DOB: 02/16/1951 DOA: 02/03/2020 PCP: [name], MD   Brief Narrative:  The patient is a 69 year old Caucasian male with a past medical history significant for but not limited to end-stage renal disease, hypertension, hyperlipidemia, CAD status post STEMI and CABG back in September 2020, history of chronic systolic CHF, atrial fibrillation, obstructive sleep apnea as well as other comorbidities who presented with dyspnea on exertion and chest discomfort.  He was worked up and underwent cardiac catheterization.  Cardiology evaluated and felt that it was demand ischemia in the setting of volume overload and recommended dialysis as he gets volume and is from dialysis.  Patient is likely under dialyzed and nephrology is consulted and cardiology is requested the patient be dialyzed again.  We will obtain a PT OT evaluation but patient may be discharged in the a.m. his cardiology is adjust his medications.  Assessment & Plan:   Principal Problem:   NSTEMI (non-ST elevated myocardial infarction) [facility]) Active Problems:   Coronary artery disease   ESRD on dialysis [facility])   Atrial fibrillation, chronic (HCC)   GERD (gastroesophageal reflux disease)   Chest pain   Acute on chronic systolic CHF (congestive heart failure) (HCC)   CHF (congestive heart failure) (HCC)   Hypotension  CAD; non-STEMI -versus ischemic demand but likely is demand ischemia as he had inadequate dialysis leading to LV stretch with demand ischemia and elevation in high-sensitivity troponin -Chest pain-with a history of coronary artery disease -Still reporting minimal chest pain overnight, but has resolved this morning. -Cardiology following, anticipating cardiac catheterization, angioplasty today post hemodialysis. -Possible non-STEMI, ischemic demand, in the setting of volume overload, hemodialysis, end-stage renal disease cardiology feels that it is demand ischemia and not a  true MI -Cardiology consulted -appreciate evaluation and recommendation -Troponin elevated 108, 119, >> 452 >>> 4,240 -Continue cardiac monitoring -Continue supportive measures: - continue beta-blockers, statins, as needed nitroglycerin, morphine -Pulmonary embolism unlikely due to patient's ongoing Eliquis use -CT chest reveals small bilateral pleural effusions with no evidence of pneumonia or fracture. -Underwent cath and Dr. Algernon Huxley feels that anginal chest pain could certainly occurred and driven his vessels but he suspects a triggering event for his chest pain with volume overload from inadequate dialysis leading to LV structure demand ischemia and elevation in high-sensitivity troponin -Aspirin is being stopped and will be resumed on Eliquis -Continue statin and beta-blocker on nondialysis days -Cardiology signed off the case and appreciate recommendations and will have nephrology dialyze the patient again prior to discharge -PT OT to still evaluate and treat  Acute on chronicsystolic CHF (congestive heart failure) ([place]) -Noted for volume overload-CHF exacerbation -on hemodialysis yesterday -Improved dyspnea -Paroxysmal nocturnal dyspnea with report of orthopnea -Likely will need further volume extraction to hemodialysis again is that he still appears volume overloaded -Patient still able to void, Lasix IV was initiated overnight -Known significant reduction in ejection fraction of 30 to 35% via TEE in March 2021. -Chest x-rayand CT scan revealno evidence of pneumonia -Pulmonary embolism unlikely considering ongoing Eliquis use -Cardiology recommends dialyzing 1 more time over the weekend to get him to a true dry weight  -They recommend continue bisoprolol 5 mg p.o. the on non-HD days  H/o hypotension -BP stable -Monitoring closely -Continue home medication of midodrine during dialysis days  ESRD on dialysis [facility]) -Hemodialysis yesterday 02/05/2020 cardiology request  patient be dialyzed again today -Dyspnea, bilateral lower extremity edema -Volume overload -anticipating volume extraction  hemodialysis -Nephrology  consulted and appreciate their help  Atrial fibrillation/A-flutter -Back in sinus rhythm now next-cardiology recommending amiodarone 200 mg p.o. daily and resuming Eliquis  -continue monitor on telemetry -Appreciate cardiology consultation  Anemia -anemia of chronic disease -Monitoring H&H closely - hgb 9.8 >>8.2 >>9.0  today  OSA - reports full compliance w/ home CPAP - order CPAP qhs while inpatient   Aortic Stenosis/Moderate MR - EE 3/21:moderately calcified and trileaflet. Mild AS with mean gradient 18 mmHg and AVA 1.7 cm^2.No AI  -Cardiology following and outpatient follow-up in  Obesity -Estimated body mass index is 36.94 kg/m as calculated from the following:   Height as of this encounter: 6' (1.829 m).   Weight as of this encounter: 123.6 kg. -Weight Loss and Dietary Counseling given   DVT prophylaxis: Anticoagulated with Eliquis Code Status: Full code Family Communication: No family present at bedside Disposition Plan: Anticipate discharge home in a.m. as he is going to be dialyzed again later  Status is: Inpatient  Remains inpatient appropriate because:Unsafe d/c plan and Inpatient level of care appropriate due to severity of illness   Dispo: The patient is from: Home              Anticipated d/c is to: TBD              Anticipated d/c date is: 1 day              Patient currently is not medically stable to d/c.  Consultants:   Nephrology  Cardiology    Procedures:  Cardiac Cath   Antimicrobials:  Anti-infectives (From admission, onward)   None     Subjective: Seen and examined at bedside this morning he had some chest pain after the cardiologist left but it was short-lived.  No nausea or vomiting but still little dyspneic.  Denying any lightheadedness or dizziness.  No other concerns or  complaints at this time.  Objective: Vitals:   02/05/20 2223 02/06/20 0100 02/06/20 0535 02/06/20 1300  BP:  114/75 112/73 124/75  Pulse: 62 60 71 66  Resp: 16 18 17 18   Temp:  97.6 F (36.4 C) 98.8 F (37.1 C) 98.7 F (37.1 C)  TempSrc:  Axillary Oral Oral  SpO2: 98% 98% 100% 100%  Weight:   123.6 kg   Height:       No intake or output data in the 24 hours ending 02/06/20 1904 Filed Weights   02/05/20 0733 02/05/20 1127 02/06/20 0535  Weight: 127.6 kg 124.2 kg 123.6 kg   Examination: Physical Exam:  Constitutional: WN/WD obese Caucasian male currently in NAD and appears mildly anxious and comfortable Eyes: Lids and conjunctivae normal, sclerae anicteric  ENMT: External Ears, Nose appear normal. Grossly normal hearing.  Neck: Appears normal, supple, no cervical masses, normal ROM, no appreciable thyromegaly; mild JVD Respiratory: Diminished to auscultation bilaterally, no wheezing, rales, rhonchi or crackles. Normal respiratory effort and patient is not tachypenic. No accessory muscle use.  Cardiovascular: RRR, has a 2/6 systolic murmur..  1+ extremity edema.  Abdomen: Soft, non-tender, distended secondary body habitus. Bowel sounds positive.  GU: Deferred. Musculoskeletal: No clubbing / cyanosis of digits/nails. No joint deformity upper and lower extremities.  Skin: No rashes, lesions, ulcers on limited skin evaluation. No induration; Warm and dry.  Neurologic: CN 2-12 grossly intact with no focal deficits.  Romberg sign cerebellar reflexes not assessed.  Psychiatric: Normal judgment and insight. Alert and oriented x 3.  Slightly anxious mood and appropriate affect.   Data Reviewed:  I have personally reviewed following labs and imaging studies  CBC: Recent Labs  Lab 02/03/20 1644 02/04/20 0405 02/05/20 0439  WBC 13.5* 11.1* 8.6  NEUTROABS  --  8.2*  --   HGB 9.8* 9.2* 9.0*  HCT 32.9* 30.5* 29.2*  MCV 92.2 90.8 91.0  PLT 194 174 093   Basic Metabolic  Panel: Recent Labs  Lab 02/03/20 1644 02/04/20 0405 02/05/20 0439  NA 139 140 138  K 4.4 4.3 3.9  CL 96* 98 98  CO2 26 24 25   GLUCOSE 98 77 83  BUN 61* 69* 42*  CREATININE 8.99* 9.59* 6.85*  CALCIUM 11.5* 11.1* 10.5*  MG  --  2.4  --    GFR: Estimated Creatinine Clearance: 13.6 mL/min (A) (by C-G formula based on SCr of 6.85 mg/dL (H)). Liver Function Tests: Recent Labs  Lab 02/04/20 0405  AST 27  ALT 9  ALKPHOS 77  BILITOT 1.4*  PROT 5.7*  ALBUMIN 2.7*   No results for input(s): LIPASE, AMYLASE in the last 168 hours. No results for input(s): AMMONIA in the last 168 hours. Coagulation Profile: No results for input(s): INR, PROTIME in the last 168 hours. Cardiac Enzymes: No results for input(s): CKTOTAL, CKMB, CKMBINDEX, TROPONINI in the last 168 hours. BNP (last 3 results) No results for input(s): PROBNP in the last 8760 hours. HbA1C: No results for input(s): HGBA1C in the last 72 hours. CBG: No results for input(s): GLUCAP in the last 168 hours. Lipid Profile: Recent Labs    02/04/20 0405  CHOL 113  HDL 28*  LDLCALC 63  TRIG 109  CHOLHDL 4.0   Thyroid Function Tests: Recent Labs    02/04/20 1146  TSH 3.797   Anemia Panel: No results for input(s): VITAMINB12, FOLATE, FERRITIN, TIBC, IRON, RETICCTPCT in the last 72 hours. Sepsis Labs: No results for input(s): PROCALCITON, LATICACIDVEN in the last 168 hours.  Recent Results (from the past 240 hour(s))  SARS Coronavirus 2 by RT PCR (hospital order, performed in [facility] hospital lab) Nasopharyngeal Nasopharyngeal Swab     Status: None   Collection Time: 02/03/20 11:27 PM   Specimen: Nasopharyngeal Swab  Result Value Ref Range Status   SARS Coronavirus 2 NEGATIVE NEGATIVE Final    Comment: (NOTE) SARS-CoV-2 target nucleic acids are NOT DETECTED.  The SARS-CoV-2 RNA is generally detectable in upper and lower respiratory specimens during the acute phase of infection. The lowest concentration of  SARS-CoV-2 viral copies this assay can detect is 250 copies / mL. A negative result does not preclude SARS-CoV-2 infection and should not be used as the sole basis for treatment or other patient management decisions.  A negative result may occur with improper specimen collection / handling, submission of specimen other than nasopharyngeal swab, presence of viral mutation(s) within the areas targeted by this assay, and inadequate number of viral copies (<250 copies / mL). A negative result must be combined with clinical observations, patient history, and epidemiological information.  Fact Sheet for Patients:   StrictlyIdeas.no  Fact Sheet for Healthcare Providers: BankingDealers.co.za  This test is not yet approved or  cleared by the [place] FDA and has been authorized for detection and/or diagnosis of SARS-CoV-2 by FDA under an Emergency Use Authorization (EUA).  This EUA will remain in effect (meaning this test can be used) for the duration of the COVID-19 declaration under Section 564(b)(1) of the Act, 21 U.S.C. section 360bbb-3(b)(1), unless the authorization is terminated or revoked sooner.  Performed at [facility]  Lab, [address], [place], [place] [address]      RN Pressure Injury Documentation:     Estimated body mass index is 36.94 kg/m as calculated from the following:   Height as of this encounter: 6' (1.829 m).   Weight as of this encounter: 123.6 kg.  Malnutrition Type:      Malnutrition Characteristics:      Nutrition Interventions:      Radiology Studies: CARDIAC CATHETERIZATION  Result Date: 02/05/2020 1. Patent bypass grafts with no interventional target. 2. There are sources for ischemia => PLV branch circulation, PLOM distal to the SVG-PLOM touchdown, mid to distal native LAD territory.  Therefore, anginal chest pain could certainly occur. 3. I suspect that the event triggering admission  was likely volume overload from inadequate dialysis, this led to LV stretch with demand ischemia and elevation in HS-TnI.   Scheduled Meds: . allopurinol  100 mg Oral Daily  . amiodarone  200 mg Oral Daily  . apixaban  5 mg Oral BID  . aspirin  81 mg Oral Daily  . bisoprolol  5 mg Oral Once per day on Sun Tue Thu Sat  . Chlorhexidine Gluconate Cloth  6 each Topical Q0600  . Chlorhexidine Gluconate Cloth  6 each Topical Q0600  . cinacalcet  30 mg Oral Q supper  . ferric citrate  420 mg Oral TID WC  . midodrine  15 mg Oral Q M,W,F-HD  . multivitamin  1 tablet Oral QHS  . rosuvastatin  10 mg Oral Daily  . sodium chloride flush  3 mL Intravenous Once  . sodium chloride flush  3 mL Intravenous Q12H  . sodium chloride flush  3 mL Intravenous Q12H   Continuous Infusions: . sodium chloride      LOS: 1 day    Kerney Elbe, DO [facility] PAGER is on AMION  If 7PM-7AM, please contact night-coverage [email or web address]

## 2020-02-07 ENCOUNTER — Inpatient Hospital Stay (HOSPITAL_COMMUNITY): Payer: Medicare Other

## 2020-02-07 LAB — CBC WITH DIFFERENTIAL/PLATELET
Abs Immature Granulocytes: 0.03 10*3/uL (ref 0.00–0.07)
Basophils Absolute: 0.1 10*3/uL (ref 0.0–0.1)
Basophils Relative: 1 %
Eosinophils Absolute: 0.1 10*3/uL (ref 0.0–0.5)
Eosinophils Relative: 2 %
HCT: 29.8 % — ABNORMAL LOW (ref 39.0–52.0)
Hemoglobin: 9.2 g/dL — ABNORMAL LOW (ref 13.0–17.0)
Immature Granulocytes: 0 %
Lymphocytes Relative: 23 %
Lymphs Abs: 1.6 10*3/uL (ref 0.7–4.0)
MCH: 28.6 pg (ref 26.0–34.0)
MCHC: 30.9 g/dL (ref 30.0–36.0)
MCV: 92.5 fL (ref 80.0–100.0)
Monocytes Absolute: 0.9 10*3/uL (ref 0.1–1.0)
Monocytes Relative: 13 %
Neutro Abs: 4.4 10*3/uL (ref 1.7–7.7)
Neutrophils Relative %: 61 %
Platelets: 194 10*3/uL (ref 150–400)
RBC: 3.22 MIL/uL — ABNORMAL LOW (ref 4.22–5.81)
RDW: 21.6 % — ABNORMAL HIGH (ref 11.5–15.5)
WBC: 7.1 10*3/uL (ref 4.0–10.5)
nRBC: 0 % (ref 0.0–0.2)

## 2020-02-07 LAB — COMPREHENSIVE METABOLIC PANEL
ALT: 12 U/L (ref 0–44)
AST: 26 U/L (ref 15–41)
Albumin: 2.6 g/dL — ABNORMAL LOW (ref 3.5–5.0)
Alkaline Phosphatase: 87 U/L (ref 38–126)
Anion gap: 10 (ref 5–15)
BUN: 12 mg/dL (ref 8–23)
CO2: 28 mmol/L (ref 22–32)
Calcium: 10.1 mg/dL (ref 8.9–10.3)
Chloride: 97 mmol/L — ABNORMAL LOW (ref 98–111)
Creatinine, Ser: 3.93 mg/dL — ABNORMAL HIGH (ref 0.61–1.24)
GFR calc Af Amer: 17 mL/min — ABNORMAL LOW (ref >60)
GFR calc non Af Amer: 15 mL/min — ABNORMAL LOW (ref >60)
Glucose, Bld: 84 mg/dL (ref 70–99)
Potassium: 3.1 mmol/L — ABNORMAL LOW (ref 3.5–5.1)
Sodium: 135 mmol/L (ref 135–145)
Total Bilirubin: 1.4 mg/dL — ABNORMAL HIGH (ref 0.3–1.2)
Total Protein: 5.4 g/dL — ABNORMAL LOW (ref 6.5–8.1)

## 2020-02-07 LAB — PHOSPHORUS: Phosphorus: 3.1 mg/dL (ref 2.5–4.6)

## 2020-02-07 LAB — MAGNESIUM: Magnesium: 1.9 mg/dL (ref 1.7–2.4)

## 2020-02-07 MED ORDER — NITROGLYCERIN 0.4 MG SL SUBL: 0.4000 mg | SUBLINGUAL_TABLET | SUBLINGUAL | 0 refills | Status: AC | PRN

## 2020-02-07 MED ORDER — POTASSIUM CHLORIDE CRYS ER 20 MEQ PO TBCR
40.0000 meq | EXTENDED_RELEASE_TABLET | Freq: Once | ORAL | Status: AC
  Administered 2020-02-07: 40 meq via ORAL
  Filled 2020-02-07: qty 2

## 2020-02-07 NOTE — Discharge Instructions (Signed)
Information on my medicine - ELIQUIS (apixaban)  This medication education was reviewed with me or my healthcare representative as part of my discharge preparation.    Why was Eliquis prescribed for you? Eliquis was prescribed for you to reduce the risk of forming blood clots that can cause a stroke if you have a medical condition called atrial fibrillation (a type of irregular heartbeat) What do You need to know about Eliquis ? Take your Eliquis TWICE DAILY - one tablet in the morning and one tablet in the evening with or without food.  It would be best to take the doses about the same time each day.  If you have difficulty swallowing the tablet whole please discuss with your pharmacist how to take the medication safely.  Take Eliquis exactly as prescribed by your doctor and DO NOT stop taking Eliquis without talking to the doctor who prescribed the medication.  Stopping may increase your risk of developing a new clot or stroke.  Refill your prescription before you run out.  After discharge, you should have regular check-up appointments with your healthcare provider that is prescribing your Eliquis.  In the future your dose may need to be changed if your kidney function or weight changes by a significant amount or as you get older.  What do you do if you miss a dose? If you miss a dose, take it as soon as you remember on the same day and resume taking twice daily.  Do not take more than one dose of ELIQUIS at the same time.  Important Safety Information A possible side effect of Eliquis is bleeding. You should call your healthcare provider right away if you experience any of the following: ? Bleeding from an injury or your nose that does not stop. ? Unusual colored urine (red or dark brown) or unusual colored stools (red or black). ? Unusual bruising for unknown reasons. ? A serious fall or if you hit your head (even if there is no bleeding).  Some medicines may interact with  Eliquis and might increase your risk of bleeding or clotting while on Eliquis. To help avoid this, consult your healthcare provider or pharmacist prior to using any new prescription or non-prescription medications, including herbals, vitamins, non-steroidal anti-inflammatory drugs (NSAIDs) and supplements.  This website has more information on Eliquis (apixaban): [email or web address].

## 2020-02-07 NOTE — Evaluation (Signed)
Physical Therapy Evaluation Patient Details Name: Alex Williams MRN: [id number] DOB: 05-21-1951 Today's Date: 02/07/2020   History of Present Illness  Patient is a 69 y/o male who presents with SOB, chest pain and weight gain. Admitted with NSTEMI and acute on chronic CHF. Chest CT- small pleural effusions, small pericardial effusion. BNP 1186. Recent admission for cellulitis 7/1-7/8. s/p LHC/Angiography 7/16. PMH includes CAD (s/p STEMI, CABG on 9/24) OSA, OA, HTN, CKD, gout, CKD on HD MWF.  Clinical Impression  Patient presents with generalized weakness, decreased activity tolerance, dyspnea on exertion and impaired mobility s/p above. Pt lives alone and Mod I for ADLs and ambulation PTA. Today, pt tolerated transfers and gait training with Min guard assist for balance/safety with use of RW. Noted to have 2-3/4 DOE with mobility requiring longer standing rest break leaning against the wall. Recommend use of RW for safety at home and energy conservation. Discussed importance of short bouts of activity with longer rest breaks. Will follow acutely to maximize independence and mobility prior to return home.    Follow Up Recommendations Home health PT;Supervision - Intermittent    Equipment Recommendations  Rolling walker with 5" wheels    Recommendations for Other Services       Precautions / Restrictions Precautions Precautions: Fall Restrictions Weight Bearing Restrictions: No      Mobility  Bed Mobility Overal bed mobility: Needs Assistance Bed Mobility: Rolling;Sidelying to Sit;Sit to Supine Rolling: Modified independent (Device/Increase time) Sidelying to sit: Modified independent (Device/Increase time);HOB elevated   Sit to supine: Modified independent (Device/Increase time);HOB elevated   General bed mobility comments: Use of rail and HOB elevated.  Transfers Overall transfer level: Needs assistance Equipment used: Rolling walker (2 wheeled) Transfers: Sit to/from  Stand Sit to Stand: Min guard;From elevated surface         General transfer comment: Asked to elevate bed height prior to standing; stood from EOB x1. Cues for hand placement.  Ambulation/Gait Ambulation/Gait assistance: Min guard Gait Distance (Feet): 70 Feet Assistive device: Rolling walker (2 wheeled) Gait Pattern/deviations: Step-through pattern;Decreased stride length;Trunk flexed Gait velocity: decreased Gait velocity interpretation: <1.31 ft/sec, indicative of household ambulator General Gait Details: Slow, mostly steady gait with use of RW; 2-3/4 DOE. Sp02 <97% on RA. 1 longer standing rest break leaning against wall. Reports feeling dizzy for a second when pushing off wall, resolves.  Stairs            Wheelchair Mobility    Modified Rankin (Stroke Patients Only)       Balance Overall balance assessment: Mild deficits observed, not formally tested                                           Pertinent Vitals/Pain Pain Assessment: No/denies pain (episodes of chest pain)    Home Living Family/patient expects to be discharged to:: Private residence Living Arrangements: Alone   Type of Home: House Home Access: Stairs to enter Entrance Stairs-Rails: Can reach both;Left;Right Entrance Stairs-Number of Steps: 2 Home Layout: One level Home Equipment: None      Prior Function Level of Independence: Independent         Comments: driving , Retired. gets SOB with walking. Fell once last week when bending over to pick up a bowl of spilled soup.     Hand Dominance   Dominant Hand: Right    Extremity/Trunk Assessment  Upper Extremity Assessment Upper Extremity Assessment: Defer to OT evaluation    Lower Extremity Assessment Lower Extremity Assessment: Generalized weakness RLE Deficits / Details: Cellulitis RLE with some tingling at times RLE Sensation: decreased light touch    Cervical / Trunk Assessment Cervical / Trunk  Assessment: Kyphotic  Communication   Communication: No difficulties  Cognition Arousal/Alertness: Awake/alert Behavior During Therapy: WFL for tasks assessed/performed Overall Cognitive Status: Within Functional Limits for tasks assessed                                        General Comments      Exercises     Assessment/Plan    PT Assessment Patient needs continued PT services  PT Problem List Decreased strength;Decreased mobility;Decreased activity tolerance;Cardiopulmonary status limiting activity       PT Treatment Interventions Therapeutic activities;Gait training;Therapeutic exercise;Patient/family education;Balance training;Functional mobility training;Stair training    PT Goals (Current goals can be found in the Care Plan section)  Acute Rehab PT Goals Patient Stated Goal: to go home today PT Goal Formulation: With patient Time For Goal Achievement: 02/21/20 Potential to Achieve Goals: Good    Frequency Min 3X/week   Barriers to discharge Decreased caregiver support lives alone    Co-evaluation               AM-PAC PT "6 Clicks" Mobility  Outcome Measure Help needed turning from your back to your side while in a flat bed without using bedrails?: A Little Help needed moving from lying on your back to sitting on the side of a flat bed without using bedrails?: A Little Help needed moving to and from a bed to a chair (including a wheelchair)?: A Little Help needed standing up from a chair using your arms (e.g., wheelchair or bedside chair)?: A Little Help needed to walk in hospital room?: A Little Help needed climbing 3-5 steps with a railing? : A Lot 6 Click Score: 17    End of Session Equipment Utilized During Treatment: Gait belt Activity Tolerance: Patient limited by fatigue;Patient tolerated treatment well Patient left: in bed;with call bell/phone within reach;with bed alarm set Nurse Communication: Mobility status PT Visit  Diagnosis: Muscle weakness (generalized) (M62.81);Unsteadiness on feet (R26.81);Other (comment) (DOE)    Time: 2536-6440 PT Time Calculation (min) (ACUTE ONLY): 20 min   Charges:   PT Evaluation $PT Eval Moderate Complexity: 1 Mod          Marisa Severin, PT, DPT Acute Rehabilitation Services Pager [phone] Office [phone]      [place] 02/07/2020, 12:49 PM

## 2020-02-07 NOTE — Evaluation (Signed)
Occupational Therapy Evaluation Patient Details Name: Alex Williams MRN: [id number] DOB: 1951/04/13 Today's Date: 02/07/2020    History of Present Illness Patient is a 69 y/o male who presents with SOB, chest pain and weight gain. Admitted with NSTEMI and acute on chronic CHF. Chest CT- small pleural effusions, small pericardial effusion. BNP 1186. Recent admission for cellulitis 7/1-7/8. s/p LHC/Angiography 7/16. PMH includes CAD (s/p STEMI, CABG on 9/24) OSA, OA, HTN, CKD, gout, CKD on HD MWF.   Clinical Impression   Patient with functional deficits listed below impacting safety and independence with self care. Patient mod I with bed mobility and min G with functional transfers/ambulation due to initial posterior loss of balance with standing and decreased activity tolerance. Supervision seated to perform LB dressing task. Initiate patient education on energy conservation strategies to implement at home, patient verbalize understanding. Will continue to follow acute OT to maximize safety with daily routine, do not anticipate further OT needs at discharge.     Follow Up Recommendations  No OT follow up;Supervision - Intermittent    Equipment Recommendations  Tub/shower seat       Precautions / Restrictions Precautions Precautions: Fall Restrictions Weight Bearing Restrictions: No      Mobility Bed Mobility Overal bed mobility: Needs Assistance Bed Mobility: Supine to Sit;Rolling;Sidelying to Sit Rolling: Modified independent (Device/Increase time) Sidelying to sit: Modified independent (Device/Increase time);HOB elevated   Sit to supine: Modified independent (Device/Increase time);HOB elevated   General bed mobility comments: Use of rail and HOB elevated.  Transfers Overall transfer level: Needs assistance Equipment used: None Transfers: Sit to/from Stand Sit to Stand: Min guard         General transfer comment: posterior loss of balance upon standing, min G for safety      Balance Overall balance assessment: Mild deficits observed, not formally tested                                         ADL either performed or assessed with clinical judgement   ADL Overall ADL's : Needs assistance/impaired     Grooming: Set up;Sitting   Upper Body Bathing: Set up;Sitting   Lower Body Bathing: Sit to/from stand;Min guard   Upper Body Dressing : Set up;Sitting   Lower Body Dressing: Min guard;Sitting/lateral leans;Sit to/from stand Lower Body Dressing Details (indicate cue type and reason): patient is able to doff/don socks seated EOB and becomes SOB with readings still in high 90s on room air, min guard in standing due to initial posterior loss of balance Toilet Transfer: Min guard;Ambulation Toilet Transfer Details (indicate cue type and reason): min guard for safety due to initial loss of balance upon standing from EOB Toileting- Clothing Manipulation and Hygiene: Min guard;Sit to/from stand       Functional mobility during ADLs: Min guard General ADL Comments: patient close to baseline, reports shortness of breath at home for few months. initiate patient education on energy conservation strategies for home including DME use and PLB                  Pertinent Vitals/Pain Pain Assessment: No/denies pain     Hand Dominance Right   Extremity/Trunk Assessment Upper Extremity Assessment Upper Extremity Assessment: Overall WFL for tasks assessed   Lower Extremity Assessment Lower Extremity Assessment: Defer to PT evaluation RLE Deficits / Details: Cellulitis RLE with some tingling at times RLE Sensation: decreased  light touch   Cervical / Trunk Assessment Cervical / Trunk Assessment: Kyphotic   Communication Communication Communication: No difficulties   Cognition Arousal/Alertness: Awake/alert Behavior During Therapy: WFL for tasks assessed/performed Overall Cognitive Status: Within Functional Limits for tasks assessed                                      General Comments  patient reports shortness of breath with labored breathing noted during LB dressing and sit to stand EOB however maintain 98-100% on room air            Home Living Family/patient expects to be discharged to:: Private residence Living Arrangements: Alone Available Help at Discharge: Family Type of Home: House Home Access: Stairs to enter Technical brewer of Steps: 2 Entrance Stairs-Rails: Can reach both;Left;Right Home Layout: One level     Bathroom Shower/Tub: Occupational psychologist: Standard Bathroom Accessibility: Yes How Accessible: Accessible via walker Home Equipment: None          Prior Functioning/Environment Level of Independence: Independent        Comments: driving , Retired. gets SOB with walking. Fell once last week when bending over to pick up a bowl of spilled soup.        OT Problem List: Decreased activity tolerance;Impaired balance (sitting and/or standing)      OT Treatment/Interventions: Self-care/ADL training;Energy conservation;DME and/or AE instruction;Balance training;Patient/family education    OT Goals(Current goals can be found in the care plan section) Acute Rehab OT Goals Patient Stated Goal: to go home today OT Goal Formulation: With patient Time For Goal Achievement: 02/21/20 Potential to Achieve Goals: Good  OT Frequency: Min 2X/week    AM-PAC OT "6 Clicks" Daily Activity     Outcome Measure Help from another person eating meals?: None Help from another person taking care of personal grooming?: A Little Help from another person toileting, which includes using toliet, bedpan, or urinal?: A Little Help from another person bathing (including washing, rinsing, drying)?: A Little Help from another person to put on and taking off regular upper body clothing?: A Little Help from another person to put on and taking off regular lower body clothing?: A  Little 6 Click Score: 19   End of Session Nurse Communication: Mobility status  Activity Tolerance: Patient tolerated treatment well Patient left: in bed;with call bell/phone within reach  OT Visit Diagnosis: Other abnormalities of gait and mobility (R26.89)                Time: 0865-7846 OT Time Calculation (min): 12 min Charges:  OT General Charges $OT Visit: 1 Visit OT Evaluation $OT Eval Low Complexity: 1 Low  Delbert Phenix OT OT office: [place] 02/07/2020, 1:55 PM

## 2020-02-07 NOTE — Care Management (Signed)
1242 02-07-20 Case Manager received consult for Durable Medical Equipment (DME) for rolling walker. Patient agreeable to use [employer] for DME Services. Referral made and DME to be delivered to the room prior to transition home. Per physical therapy- patient will not need home physical therapy at this time. Patient has transportation home. No further needs from Case Manager. Bethena Roys, RN,BSN Case Manager

## 2020-02-07 NOTE — Progress Notes (Signed)
[facility] Progress Note   Subjective:  Completed dialysis early this am with 2.5L UF. Tolerated well.  No new complaints this am. Denies cp, sob.    Objective Vitals:   02/07/20 0142 02/07/20 0148 02/07/20 0421 02/07/20 0800  BP: 102/61  99/63 93/65  Pulse: 64  60   Resp: 19  15 16   Temp: 98.5 F (36.9 C)  98.5 F (36.9 C) 97.6 F (36.4 C)  TempSrc: Oral  Oral Oral  SpO2: 99%  100% 100%  Weight:  120.8 kg    Height:         Physical Exam General:  Obese man, lying in bed nad Heart: RRR  Lungs: Clear bilaterally Abdomen: soft non-tender, non-distended.  Extremities: no sig LE edema  Dialysis Access: RUE AVF +bruit   Additional Objective Labs: Basic Metabolic Panel: Recent Labs  Lab 02/05/20 0439 02/06/20 1856 02/07/20 0534  NA 138 139 135  K 3.9 3.5 3.1*  CL 98 100 97*  CO2 25 25 28   GLUCOSE 83 106* 84  BUN 42* 29* 12  CREATININE 6.85* 6.29* 3.93*  CALCIUM 10.5* 10.9* 10.1  PHOS  --  4.8* 3.1   CBC: Recent Labs  Lab 02/03/20 1644 02/03/20 1644 02/04/20 0405 02/04/20 0405 02/05/20 0439 02/06/20 1856 02/07/20 0534  WBC 13.5*   < > 11.1*   < > 8.6 7.1 7.1  NEUTROABS  --   --  8.2*  --   --  4.6 4.4  HGB 9.8*   < > 9.2*   < > 9.0* 9.3* 9.2*  HCT 32.9*   < > 30.5*   < > 29.2* 30.9* 29.8*  MCV 92.2  --  90.8  --  91.0 92.0 92.5  PLT 194   < > 174   < > 180 202 194   < > = values in this interval not displayed.   Blood Culture    Component Value Date/Time   SDES BLOOD LEFT HAND 01/21/2020 2035   SPECREQUEST  01/21/2020 2035    BOTTLES DRAWN AEROBIC AND ANAEROBIC Blood Culture adequate volume   CULT  01/21/2020 2035    NO GROWTH 5 DAYS Performed at [facility], [place] [address], [place], [place] [address]    REPTSTATUS 01/26/2020 FINAL 01/21/2020 2035     Medications: . sodium chloride     . allopurinol  100 mg Oral Daily  . amiodarone  200 mg Oral Daily  . apixaban  5 mg Oral BID  . bisoprolol  5 mg Oral Once per  day on Sun Tue Thu Sat  . Chlorhexidine Gluconate Cloth  6 each Topical Q0600  . Chlorhexidine Gluconate Cloth  6 each Topical Q0600  . cinacalcet  30 mg Oral Q supper  . ferric citrate  420 mg Oral TID WC  . midodrine  15 mg Oral Q M,W,F-HD  . multivitamin  1 tablet Oral QHS  . rosuvastatin  10 mg Oral Daily  . sodium chloride flush  3 mL Intravenous Once  . sodium chloride flush  3 mL Intravenous Q12H  . sodium chloride flush  3 mL Intravenous Q12H    Dialysis Orders:  MWF [facility] 4.5h 450/800 133kg 2/2 bath P4 AVF Hep 2000  mircera 75ug q2 wks (due 7/16)   Assessment/Plan: 1. NSTEMI - Known CAD s/p CABG - LHC/Angiography 7/16 -patent bypass grafts no interventional targets.  2. Dyspnea/CHF/Volume overload --pt may be losing body wt. CXR w/ early IS edema pattern. Hx CM  EF 35%. BP's on the low side.  Had serial HD to  challenge / lower the dry wt. He is now well below his outpatient dry weight. Will lower EDW  at discharge.  3. ESRD - on HD MWF.  Next HD 7/19 4. Chronic hypotension. On midodrine 10 for BP support on HD.  5. Anemia ckd - cont ESA as outpatient.  6. MBD ckd - Ca++ elevated.  Calcitriol on hold, getting sensipar. 2 phos binders.  7. CAD sp CABG 8. Atrial fib - on BB/ eliquis/ amio  9. HL - statin  Lynnda Child PA-C [facility] 02/07/2020,9:09 AM

## 2020-02-07 NOTE — TOC Initial Note (Signed)
Transition of Care [facility]) - Initial/Assessment Note    Patient Details  Name: Alex Williams MRN: [id number] Date of Birth: 13-Feb-1951  Transition of Care [facility]) CM/SW Contact:    Bethena Roys, RN Phone Number: 02/07/2020, 1:49 PM  Clinical Narrative:  Case Manager was able to view PT notes and this patient will need home health physical therapy services.  Case Manager confirmed with PT. Case Manager offered choice and the patient had no preference. Case Manager asked if ok to call [facility]-patient was agreeable. Referral was made to [place] and start of care to begin within 24-48 hours post transition home. DME has been delivered to the room. Patient has transportation home. No further needs from Case Manager at this time.                 Expected Discharge Plan: [place] Barriers to Discharge: No Barriers Identified   Patient Goals and CMS Choice Patient states their goals for this hospitalization and ongoing recovery are:: to return home with home health services. CMS Medicare.gov Compare Post Acute Care list provided to:: Patient Choice offered to / list presented to : Patient  Expected Discharge Plan and Services Expected Discharge Plan: [place] In-house Referral: NA Discharge Planning Services: CM Consult Post Acute Care Choice: Home Health, Durable Medical Equipment Living arrangements for the past 2 months: Single Family Home Expected Discharge Date: 02/07/20               DME Arranged: Gilford Rile rolling DME Agency: [employer] Date DME Agency Contacted: 02/07/20 Time DME Agency Contacted: [phone] Representative spoke with at DME Agency: [place]: PT [place]: [place] ([place]) Date [place]: 02/07/20 Time [place]: [id number] Representative spoke with at [place]: Santiago Glad  Prior Living Arrangements/Services Living arrangements for the past 2 months: [place] with:: Self (has support of son.) Patient language and need for interpreter reviewed:: Yes Do you feel safe going back to the place where you live?: Yes      Need for Family Participation in Patient Care: Yes (Comment) Care giver support system in place?: Yes (comment)   Criminal Activity/Legal Involvement Pertinent to Current Situation/Hospitalization: No - Comment as needed  Activities of Daily Living Home Assistive Devices/Equipment: Cane (specify quad or straight), Eyeglasses, Walker (specify type) ADL Screening (condition at time of admission) Patient's cognitive ability adequate to safely complete daily activities?: Yes Is the patient deaf or have difficulty hearing?: No Does the patient have difficulty seeing, even when wearing glasses/contacts?: No Does the patient have difficulty concentrating, remembering, or making decisions?: No Patient able to express need for assistance with ADLs?: Yes Does the patient have difficulty dressing or bathing?: No Independently performs ADLs?: Yes (appropriate for developmental age) Does the patient have difficulty walking or climbing stairs?: Yes Weakness of Legs: Both Weakness of Arms/Hands: None  Permission Sought/Granted Permission sought to share information with : Family Supports, Customer service manager, Case Optician, dispensing granted to share information with : Yes, Verbal Permission Granted     Permission granted to share info w AGENCY: [place].        Emotional Assessment Appearance:: Appears stated age Attitude/Demeanor/Rapport: Engaged Affect (typically observed): Appropriate Orientation: : Oriented to Situation, Oriented to  Time, Oriented to Place, Oriented to Self Alcohol / Substance Use: Not Applicable Psych Involvement: No (comment)  Admission diagnosis:  Dyspnea on exertion [R06.00] Chest  pain [R07.9] Chest pain, unspecified type [R07.9] CHF (congestive heart  failure) ([place]) [I50.9] Patient Active Problem List   Diagnosis Date Noted  . CHF (congestive heart failure) ([place]) 02/05/2020  . Hypotension 02/05/2020  . NSTEMI (non-ST elevated myocardial infarction) ([place]) 02/05/2020  . Chest pain 02/04/2020  . Acute on chronic systolic CHF (congestive heart failure) ([place]) 02/04/2020  . Atrial fibrillation, chronic ([place]) 01/21/2020  . GERD (gastroesophageal reflux disease) 01/21/2020  . Cutaneous abscess, unspecified 12/30/2019  . Cellulitis of right lower extremity 12/28/2019  . Hypercalcemia 12/14/2019  . Primary osteoarthritis of right knee 10/29/2019  . Body mass index 40.0-44.9, adult ([place]) 10/29/2019  . Morbid obesity ([place]) 10/29/2019  . Fluid overload, unspecified 08/18/2019  . 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 Lesion of left ulnar nerve 06/26/2019  . Moderate protein-calorie malnutrition ([place]) 05/09/2019  . Hypokalemia 05/09/2019  . Recurrent knee pain 05/01/2019  . 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 Debility 04/30/2019  . ESRD on dialysis ([place])   . Hemodialysis-associated hypotension   . Anemia due to end stage renal disease ([place])   . 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 Coronary artery disease 04/16/2019  . Acute ST elevation myocardial infarction (STEMI) involving left anterior descending (LAD) coronary artery ([place])   . Acute ST elevation myocardial infarction (STEMI) of anterior wall ([place])   . Pain, unspecified 04/09/2019  . Male erectile dysfunction, unspecified 04/09/2019  . Iron deficiency anemia, unspecified 04/09/2019  . Insomnia, unspecified 04/09/2019  . Encounter for immunization 04/09/2019  . Coagulation defect, unspecified ([place]) 04/09/2019  . Acute on chronic renal failure ([place]) 08/26/2018  . Nonspecific abnormal electrocardiogram (ECG) (EKG) 08/25/2018  . Anemia in CKD (chronic kidney disease) 08/25/2018  . Elevated troponin 08/25/2018  . SOB (shortness of breath) 08/25/2018   . Morbid obesity with BMI of 45.0-49.9, adult ([place]) 12/03/2013  . Renal mass 12/02/2013  . Lower extremity edema 03/16/2013  . GOUT 01/30/2010  . Obstructive sleep apnea 01/27/2009  . HYPERCHOLESTEROLEMIA 06/30/2007  . Essential hypertension 06/27/2007  . Osteoarthritis 06/27/2007  . LOW BACK PAIN 06/27/2007  . History of colonic polyps 06/27/2007  . NEPHROLITHIASIS, HX OF 06/27/2007   PCP:  [name], MD Pharmacy:   [id number] - [place], [address] AT [address] [place] [address] Phone: [phone] Fax: [phone]   Readmission Risk Interventions Readmission Risk Prevention Plan 02/07/2020  Transportation Screening Complete  Medication Review ([place]) Complete  HRI or [place] Complete  SW Recovery Care/Counseling Consult Complete  [place] Not Applicable  Some recent data might be hidden

## 2020-02-07 NOTE — Discharge Summary (Signed)
Physician Discharge Summary  Alex Williams:[id number] DOB: [birth year] DOA: 02/03/2020  PCP: [name], MD  Admit date: 02/03/2020 Discharge date: 02/07/2020  Admitted From: Home Disposition: Home with Home Health PT  Recommendations for Outpatient Follow-up:  1. Follow up with PCP in 1-2 weeks 2. Follow-up with nephrology within 1 to 2 weeks and continue dialysis Monday Wednesday Friday 3. Follow-up with cardiology within 1 to 2 weeks 4. Please obtain CMP/CBC, Mag, Phos in one week 5. Please follow up on the following pending results:  Home Health: YES Equipment/Devices: RW with 5" Wheels  Discharge Condition: Stable  CODE STATUS: FULL CODE Diet recommendation: Renal Heart Healthy Diet with 1200 mL Fluid Restriction   Brief/Interim Summary: The patient is a 69 year old Caucasian male with a past medical history significant for but not limited to end-stage renal disease, hypertension, hyperlipidemia, CAD status post STEMI and CABG back in September 2020, history of chronic systolic CHF, atrial fibrillation, obstructive sleep apnea as well as other comorbidities who presented with dyspnea on exertion and chest discomfort.  He was worked up and underwent cardiac catheterization.  Cardiology evaluated and felt that it was demand ischemia in the setting of volume overload and recommended dialysis as he gets volume and is from dialysis.  Patient was likely under dialyzed and nephrology is consulted and cardiology is requested the patient be dialyzed again prior to discharge and he was dialyzed early this morning.  PT and OT evaluated and they recommended home health PT.  Patient will be discharged with a rolling walker need to follow-up with PCP, cardiology and nephrology and he understands agrees with plan of care and is stable to be discharged at this time is his post dialysis.  Discharge Diagnoses:  Principal Problem:   NSTEMI (non-ST elevated myocardial infarction) [facility]) Active  Problems:   Coronary artery disease   ESRD on dialysis [facility])   Atrial fibrillation, chronic (HCC)   GERD (gastroesophageal reflux disease)   Chest pain   Acute on chronic systolic CHF (congestive heart failure) (HCC)   CHF (congestive heart failure) (HCC)   Hypotension  CAD; non-STEMI -versus ischemic demand but likely is demand ischemia as he had inadequate dialysis leading to LV stretch with demand ischemia and elevation in high-sensitivity troponin -Chest pain-with a history of coronary artery disease -Still reporting minimal chest pain overnight, but has resolved this morning. -Cardiology following, anticipating cardiac catheterization, angioplasty today post hemodialysis. -Possible non-STEMI,ischemic demand, in the setting of volume overload, hemodialysis, end-stage renal disease cardiology feels that it is demand ischemia and not a true MI -Cardiology consulted -appreciate evaluation and recommendation -Troponin elevated 108, 119, >> 452>>> 4,240 -Continue cardiac monitoring -Continue supportive measures: -continue beta-blockers, statins, as needed nitroglycerin, morphine -Pulmonary embolism unlikely due to patient's ongoing Eliquis use -CT chest reveals small bilateral pleural effusions with no evidence of pneumonia or fracture. -Underwent cath and Dr. Algernon Huxley feels that anginal chest pain could certainly occurred and driven his vessels but he suspects a triggering event for his chest pain with volume overload from inadequate dialysis leading to LV structure demand ischemia and elevation in high-sensitivity troponin -Aspirin is being stopped and will be resumed on Eliquis -Continue statin and beta-blocker on nondialysis days -Cardiology signed off the case and appreciate recommendations and will have nephrology dialyze the patient again prior to discharge -PT OT recommending home health PT and rolling walker  Acute on chronicsystolic CHF (congestive heart failure)  ([place]) -Noted for volume overload-CHF exacerbation-on hemodialysis yesterday -Improved dyspnea -Paroxysmal nocturnal  dyspnea with report of orthopnea -Likely will need further volume extraction to hemodialysis again is that he still appears volume overloaded -Patient still able to void, Lasix IV was initiated overnight -Known significant reduction in ejection fraction of 30 to 35% via TEE in March 2021. -Chest x-rayand CT scan revealno evidence of pneumonia -Pulmonary embolism unlikely considering ongoing Eliquis use -Cardiology recommends dialyzing 1 more time over the weekend to get him to a true dry weight  -They recommend continue bisoprolol 5 mg p.o. the on non-HD days -Continue with volume maintenance with dialysis and follow-up with cardiology within 1 to 2 weeks  H/ohypotension -BP stable -Monitoring closely -Continue home medication of midodrine during dialysis days  ESRD on dialysis [facility]) -Hemodialysis yesterday 02/05/2020 cardiology request patient be dialyzed again and this was done earliest morning -BUN/creatinine is now 12/3.93 -Dyspnea, bilateral lower extremity edema -Volume overload -anticipating volume extraction hemodialysis and this was done early this morning -Nephrology consulted and appreciate their help with maintenance of dialysis  Atrial fibrillation/A-flutter -Back in sinus rhythm now next-cardiology recommending amiodarone 200 mg p.o. daily and resuming Eliquis  -continue monitor on telemetry -Appreciate cardiology consultation and have him follow-up with cardiology 2 weeks  Anemia -anemia of chronic disease -Monitoring H&H closely -Hemoglobin/hematocrit has been relatively stable an today it was 9.2/29.9 -Continue to monitor for signs and symptoms of bleeding; currently no overt bleeding noted -Repeat CBC within 1 week  OSA - reports full compliance w/ home CPAP - order CPAP qhs while inpatient and resume as an outpatient  Aortic  Stenosis/Moderate MR - EE 3/21:moderately calcified and trileaflet. Mild AS with mean gradient 18 mmHg and AVA 1.7 cm^2.No AI  -Cardiology following and outpatient follow-up in  Obesity -Estimated body mass index is 36.94 kg/m as calculated from the following:   Height as of this encounter: 6' (1.829 m).   Weight as of this encounter: 123.6 kg. -Weight Loss and Dietary Counseling given   Hyperbilirubinemia -The patient's T bili is now 1.4 -Likely reactive -Continue monitor and trend in outpatient setting repeat CMP within 1 week  Discharge Instructions  Discharge Instructions    Call MD for:  difficulty breathing, headache or visual disturbances   Complete by: As directed    Call MD for:  extreme fatigue   Complete by: As directed    Call MD for:  hives   Complete by: As directed    Call MD for:  persistant dizziness or light-headedness   Complete by: As directed    Call MD for:  persistant nausea and vomiting   Complete by: As directed    Call MD for:  redness, tenderness, or signs of infection (pain, swelling, redness, odor or green/yellow discharge around incision site)   Complete by: As directed    Call MD for:  severe uncontrolled pain   Complete by: As directed    Call MD for:  temperature >100.4   Complete by: As directed    Diet - low sodium heart healthy   Complete by: As directed    RENAL CARB Modified 1200 FLUID restriction   Discharge instructions   Complete by: As directed    You were cared for by a hospitalist during your hospital stay. If you have any questions about your discharge medications or the care you received while you were in the hospital after you are discharged, you can call the unit and ask to speak with the hospitalist on call if the hospitalist that took care of you is not  available. Once you are discharged, your primary care physician will handle any further medical issues. Please note that NO REFILLS for any discharge medications will be  authorized once you are discharged, as it is imperative that you return to your primary care physician (or establish a relationship with a primary care physician if you do not have one) for your aftercare needs so that they can reassess your need for medications and monitor your lab values.  Follow up with PCP, Nephrology, and Cardiology within 1-2 weeks. Take all medications as prescribed. If symptoms change or worsen please return to the ED for evaluation   Increase activity slowly   Complete by: As directed      Allergies as of 02/07/2020      Reactions   Codeine Phosphate Other (See Comments)   Hyperactive       Medication List    STOP taking these medications   aspirin 81 MG EC tablet     TAKE these medications   acetaminophen 500 MG tablet Commonly known as: TYLENOL Take 1,000 mg by mouth daily as needed for mild pain.   allopurinol 100 MG tablet Commonly known as: ZYLOPRIM Take 1 tablet (100 mg total) by mouth daily.   amiodarone 200 MG tablet Commonly known as: PACERONE TAKE 1 TABLET BY MOUTH EVERY DAY   bisoprolol 5 MG tablet Commonly known as: ZEBETA TAKE 1 TABLET AS DIRECTED ON NON DIALYSIS TUES,THURS,SAT,SUN NEEDS APPT What changed: See the new instructions.   calcitRIOL 0.25 MCG capsule Commonly known as: ROCALTROL Take 1 capsule (0.25 mcg total) by mouth every Monday, Wednesday, and Friday with hemodialysis.   Centrum Silver 50+Men Tabs Take 1 tablet by mouth daily.   cetirizine 10 MG tablet Commonly known as: ZYRTEC Take 10 mg by mouth daily as needed for allergies.   cinacalcet 30 MG tablet Commonly known as: SENSIPAR Take 1 tablet (30 mg total) by mouth daily with supper.   diphenhydramine-acetaminophen 25-500 MG Tabs tablet Commonly known as: TYLENOL PM Take 1 tablet by mouth at bedtime as needed (sleep).   Eliquis 5 MG Tabs tablet Generic drug: apixaban TAKE 1 TABLET BY MOUTH TWICE A DAY What changed: how much to take   ferric citrate 1  GM 210 MG(Fe) tablet Commonly known as: AURYXIA Take 420 mg by mouth 3 (three) times daily with meals.   fluticasone 50 MCG/ACT nasal spray Commonly known as: FLONASE Place 1 spray into both nostrils daily as needed for allergies or rhinitis.   gabapentin 100 MG capsule Commonly known as: NEURONTIN TAKE 1 CAPSULE AT BEDTIME ON HD DAYS (M,W,F) What changed:   how much to take  how to take this  when to take this  additional instructions   HYDROcodone-acetaminophen 5-325 MG tablet Commonly known as: NORCO/VICODIN Take 1 tablet by mouth every 6 (six) hours as needed for moderate pain.   methocarbamol 500 MG tablet Commonly known as: ROBAXIN Take 1 tablet (500 mg total) by mouth every 8 (eight) hours as needed for muscle spasms.   midodrine 10 MG tablet Commonly known as: PROAMATINE Take 1 tablet (10 mg total) by mouth every Monday, Wednesday, and Friday with hemodialysis.   multivitamin Tabs tablet Take 1 tablet by mouth at bedtime.   nitroGLYCERIN 0.4 MG SL tablet Commonly known as: NITROSTAT Place 1 tablet (0.4 mg total) under the tongue every 5 (five) minutes as needed for chest pain.   ondansetron 8 MG disintegrating tablet Commonly known as: ZOFRAN-ODT Take 1 tablet (8 mg  total) by mouth every 8 (eight) hours as needed for nausea or vomiting.   pantoprazole 40 MG tablet Commonly known as: PROTONIX Take 1 tablet (40 mg total) by mouth daily as needed (acid reflux).   promethazine 12.5 MG tablet Commonly known as: PHENERGAN Take 1 tablet (12.5 mg total) by mouth every 8 (eight) hours as needed for nausea or vomiting.   rosuvastatin 10 MG tablet Commonly known as: CRESTOR Take 1 tablet (10 mg total) by mouth daily.            Durable Medical Equipment  (From admission, onward)         Start     Ordered   02/07/20 1227  For home use only DME Walker rolling  Once       Question Answer Comment  Walker: With 5 Inch Wheels   Patient needs a walker to  treat with the following condition Generalized weakness      02/07/20 1226          Allergies  Allergen Reactions  . Codeine Phosphate Other (See Comments)    Hyperactive    Consultations:  Cardiology  Nephrology  Procedures/Studies: DG Chest 2 View  Result Date: 02/03/2020 CLINICAL DATA:  Dyspnea on exertion for 3 months, previous tobacco abuse EXAM: CHEST - 2 VIEW COMPARISON:  12/28/2019 FINDINGS: Frontal and lateral views of the chest demonstrates stable enlargement of the cardiac silhouette. Postsurgical changes from prior bypass surgery. There is chronic central vascular congestion. Retrocardiac consolidation may reflect atelectasis. No effusion or pneumothorax. No acute bony abnormalities. IMPRESSION: 1. Chronic central vascular congestion. 2. Retrocardiac consolidation favor atelectasis. 3. Stable cardiomegaly. Electronically Signed   By: Randa Ngo M.D.   On: 02/03/2020 16:56   CT Chest Wo Contrast  Result Date: 02/04/2020 CLINICAL DATA:  68 year old male with increasing shortness of breath. Missed dialysis. Retrocardiac opacity on chest radiographs. EXAM: CT CHEST WITHOUT CONTRAST TECHNIQUE: Multidetector CT imaging of the chest was performed following the standard protocol without IV contrast. COMPARISON:  Chest radiographs earlier tonight. Chest CT 01/28/2010. FINDINGS: Cardiovascular: Calcified aortic atherosclerosis. Prior CABG. Cardiomegaly is increased since 2011. Small volume pericardial effusion with simple fluid density (series 3, image 106). Vascular patency is not evaluated in the absence of IV contrast. Mediastinum/Nodes: Negative.  No mediastinal lymphadenopathy. Lungs/Pleura: Small bilateral layering pleural effusions. Simple fluid density favoring transudate. Fluid mildly tracks into the left major fissure. Associated compressive atelectasis. Major airways are patent. Occasional superimposed faint upper lobe subpleural densities which are probably mild scarring.  Upper Abdomen: Cholelithiasis. No pericholecystic inflammation is evident. Negative visible noncontrast liver, spleen, pancreas, and bowel in the upper abdomen. There is new low-density left adrenal gland thickening since 2011 which could be adrenal hyperplasia or benign adenoma. Musculoskeletal: Prior sternotomy. 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 No acute osseous abnormality identified. IMPRESSION: 1. Small layering pleural effusions with compressive atelectasis. 2. Cardiomegaly with small volume pericardial effusion. Prior CABG. Aortic Atherosclerosis (ICD10-I70.0). 3. Cholelithiasis. Electronically Signed   By: Genevie Ann M.D.   On: 02/04/2020 00:50   CARDIAC CATHETERIZATION  Result Date: 02/05/2020 1. Patent bypass grafts with no interventional target. 2. There are sources for ischemia => PLV branch circulation, PLOM distal to the SVG-PLOM touchdown, mid to distal native LAD territory.  Therefore, anginal chest pain could certainly occur. 3. I suspect that the event triggering admission was likely volume overload from inadequate dialysis, this led to LV stretch with demand ischemia and elevation  in HS-TnI.   VAS [place] LOWER EXTREMITY VENOUS (DVT) (ONLY [facility] & [facility])  Result Date: 01/21/2020  Lower Venous DVTStudy Indications: Worsening erythema and pain on thigh, erythema of calf x 1 month.  Other Factors: Cellulitis on antibiotics, Hx GSV harvest. Limitations: Body habitus and patient pain level. Comparison Study: 12/28/19 negative Performing Technologist: June Leap RDMS, RVT  Examination Guidelines: A complete evaluation includes B-mode imaging, spectral Doppler, color Doppler, and power Doppler as needed of all accessible portions of each vessel. Bilateral testing is considered an integral part of a complete examination. Limited examinations for reoccurring indications may be performed as noted. The reflux portion of the exam is performed with  the patient in reverse Trendelenburg.  +---------+---------------+---------+-----------+----------+--------------+ RIGHT    CompressibilityPhasicitySpontaneityPropertiesThrombus Aging +---------+---------------+---------+-----------+----------+--------------+ CFV      Full           Yes      Yes                                 +---------+---------------+---------+-----------+----------+--------------+ SFJ      Full                                                        +---------+---------------+---------+-----------+----------+--------------+ FV Prox  Full                                                        +---------+---------------+---------+-----------+----------+--------------+ FV Mid   Full                                                        +---------+---------------+---------+-----------+----------+--------------+ FV DistalFull                                                        +---------+---------------+---------+-----------+----------+--------------+ PFV      Full                                                        +---------+---------------+---------+-----------+----------+--------------+ POP      Full           Yes      Yes                                 +---------+---------------+---------+-----------+----------+--------------+ PTV      Full                                                        +---------+---------------+---------+-----------+----------+--------------+  PERO     Full                                                        +---------+---------------+---------+-----------+----------+--------------+   +----+---------------+---------+-----------+----------+------------------------+ LEFTCompressibilityPhasicitySpontaneityProperties                         +----+---------------+---------+-----------+----------+------------------------+ CFV                                              not imaged due  to                                                         patient positioning      +----+---------------+---------+-----------+----------+------------------------+     Summary: RIGHT: - There is no evidence of deep vein thrombosis in the lower extremity.  - No cystic structure found in the popliteal fossa.   *See table(s) above for measurements and observations. Electronically signed by Servando Snare MD on 01/21/2020 at 4:32:00 PM.    Final      Subjective: Seen and examined at bedside after his dialysis done early this morning he had complained of some chest pain that was transient but is now gone only.  No nausea or vomiting.  Feels well and back to work with therapy.  No other concerns or complaints this time is stable to be discharged home and follow-up with PCP, cardiology, as well as nephrology in the outpatient setting.  Discharge Exam: Vitals:   02/07/20 0800 02/07/20 1200  BP: 93/65 (!) 93/54  Pulse:    Resp: 16   Temp: 97.6 F (36.4 C)   SpO2: 100%    Vitals:   02/07/20 0148 02/07/20 0421 02/07/20 0800 02/07/20 1200  BP:  99/63 93/65 (!) 93/54  Pulse:  60    Resp:  15 16   Temp:  98.5 F (36.9 C) 97.6 F (36.4 C)   TempSrc:  Oral Oral   SpO2:  100% 100%   Weight: 120.8 kg     Height:       General: Pt is alert, awake, not in acute distress Cardiovascular: RRR, S1/S2 +, no rubs, no gallops Respiratory: Diminished bilaterally, no wheezing, no rhonchi Abdominal: Soft, NT, distended secondary body habitus, bowel sounds + Extremities: 1+ lower extremity edema, no cyanosis  The results of significant diagnostics from this hospitalization (including imaging, microbiology, ancillary and laboratory) are listed below for reference.    Microbiology: Recent Results (from the past 240 hour(s))  SARS Coronavirus 2 by RT PCR (hospital order, performed in [facility] hospital lab) Nasopharyngeal Nasopharyngeal Swab     Status: None   Collection Time: 02/03/20 11:27 PM    Specimen: Nasopharyngeal Swab  Result Value Ref Range Status   SARS Coronavirus 2 NEGATIVE NEGATIVE Final    Comment: (NOTE) SARS-CoV-2 target nucleic acids are NOT DETECTED.  The SARS-CoV-2 RNA is generally detectable in upper and lower respiratory specimens during the acute phase of infection. The lowest concentration of SARS-CoV-2 viral copies this  assay can detect is 250 copies / mL. A negative result does not preclude SARS-CoV-2 infection and should not be used as the sole basis for treatment or other patient management decisions.  A negative result may occur with improper specimen collection / handling, submission of specimen other than nasopharyngeal swab, presence of viral mutation(s) within the areas targeted by this assay, and inadequate number of viral copies (<250 copies / mL). A negative result must be combined with clinical observations, patient history, and epidemiological information.  Fact Sheet for Patients:   StrictlyIdeas.no  Fact Sheet for Healthcare Providers: BankingDealers.co.za  This test is not yet approved or  cleared by the [place] FDA and has been authorized for detection and/or diagnosis of SARS-CoV-2 by FDA under an Emergency Use Authorization (EUA).  This EUA will remain in effect (meaning this test can be used) for the duration of the COVID-19 declaration under Section 564(b)(1) of the Act, 21 U.S.C. section 360bbb-3(b)(1), unless the authorization is terminated or revoked sooner.  Performed at [facility], [place] [address], [place], [place] [address]     Labs: BNP (last 3 results) No results for input(s): BNP in the last 8760 hours. Basic Metabolic Panel: Recent Labs  Lab 02/03/20 1644 02/04/20 0405 02/05/20 0439 02/06/20 1856 02/07/20 0534  NA 139 140 138 139 135  K 4.4 4.3 3.9 3.5 3.1*  CL 96* 98 98 100 97*  CO2 26 24 25 25 28   GLUCOSE 98 77 83 106* 84  BUN 61* 69* 42* 29*  12  CREATININE 8.99* 9.59* 6.85* 6.29* 3.93*  CALCIUM 11.5* 11.1* 10.5* 10.9* 10.1  MG  --  2.4  --  2.2 1.9  PHOS  --   --   --  4.8* 3.1   Liver Function Tests: Recent Labs  Lab 02/04/20 0405 02/06/20 1856 02/07/20 0534  AST 27 34 26  ALT 9 13 12   ALKPHOS 77 87 87  BILITOT 1.4* 1.3* 1.4*  PROT 5.7* 5.5* 5.4*  ALBUMIN 2.7* 2.8* 2.6*   No results for input(s): LIPASE, AMYLASE in the last 168 hours. No results for input(s): AMMONIA in the last 168 hours. CBC: Recent Labs  Lab 02/03/20 1644 02/04/20 0405 02/05/20 0439 02/06/20 1856 02/07/20 0534  WBC 13.5* 11.1* 8.6 7.1 7.1  NEUTROABS  --  8.2*  --  4.6 4.4  HGB 9.8* 9.2* 9.0* 9.3* 9.2*  HCT 32.9* 30.5* 29.2* 30.9* 29.8*  MCV 92.2 90.8 91.0 92.0 92.5  PLT 194 174 180 202 194   Cardiac Enzymes: No results for input(s): CKTOTAL, CKMB, CKMBINDEX, TROPONINI in the last 168 hours. BNP: Invalid input(s): POCBNP CBG: No results for input(s): GLUCAP in the last 168 hours. D-Dimer No results for input(s): DDIMER in the last 72 hours. Hgb A1c No results for input(s): HGBA1C in the last 72 hours. Lipid Profile No results for input(s): CHOL, HDL, LDLCALC, TRIG, CHOLHDL, LDLDIRECT in the last 72 hours. Thyroid function studies No results for input(s): TSH, T4TOTAL, T3FREE, THYROIDAB in the last 72 hours.  Invalid input(s): FREET3 Anemia work up No results for input(s): VITAMINB12, FOLATE, FERRITIN, TIBC, IRON, RETICCTPCT in the last 72 hours. Urinalysis    Component Value Date/Time   COLORURINE DK. YELLOW 01/20/2009 0918   APPEARANCEUR Clear 08/09/2017 1116   LABSPEC >=1.030 01/20/2009 [place] 5.0 01/20/2009 0918   GLUCOSEU Negative 08/09/2017 [place] 01/20/2009 0918   BILIRUBINUR Negative 08/09/2017 1116   KETONESUR negative 12/01/2016 3790  KETONESUR TRACE 01/20/2009 0918   PROTEINUR 3+ (A) 08/09/2017 1116   UROBILINOGEN 0.2 12/01/2016 0822   UROBILINOGEN 0.2 01/20/2009 0918   NITRITE  Negative 08/09/2017 1116   NITRITE NEGATIVE 01/20/2009 0918   LEUKOCYTESUR Negative 08/09/2017 1116   Sepsis Labs Invalid input(s): PROCALCITONIN,  WBC,  LACTICIDVEN Microbiology Recent Results (from the past 240 hour(s))  SARS Coronavirus 2 by RT PCR (hospital order, performed in [place] hospital lab) Nasopharyngeal Nasopharyngeal Swab     Status: None   Collection Time: 02/03/20 11:27 PM   Specimen: Nasopharyngeal Swab  Result Value Ref Range Status   SARS Coronavirus 2 NEGATIVE NEGATIVE Final    Comment: (NOTE) SARS-CoV-2 target nucleic acids are NOT DETECTED.  The SARS-CoV-2 RNA is generally detectable in upper and lower respiratory specimens during the acute phase of infection. The lowest concentration of SARS-CoV-2 viral copies this assay can detect is 250 copies / mL. A negative result does not preclude SARS-CoV-2 infection and should not be used as the sole basis for treatment or other patient management decisions.  A negative result may occur with improper specimen collection / handling, submission of specimen other than nasopharyngeal swab, presence of viral mutation(s) within the areas targeted by this assay, and inadequate number of viral copies (<250 copies / mL). A negative result must be combined with clinical observations, patient history, and epidemiological information.  Fact Sheet for Patients:   StrictlyIdeas.no  Fact Sheet for Healthcare Providers: BankingDealers.co.za  This test is not yet approved or  cleared by the [place] FDA and has been authorized for detection and/or diagnosis of SARS-CoV-2 by FDA under an Emergency Use Authorization (EUA).  This EUA will remain in effect (meaning this test can be used) for the duration of the COVID-19 declaration under Section 564(b)(1) of the Act, 21 U.S.C. section 360bbb-3(b)(1), unless the authorization is terminated or revoked sooner.  Performed at [facility], [place] [address], [place], [place] [address]    Time coordinating discharge: 35 minutes  SIGNED:  Kerney Elbe, DO [facility] 02/07/2020, 12:33 PM Pager is on [place]  If 7PM-7AM, please contact night-coverage [email or web address]

## 2020-02-08 ENCOUNTER — Encounter (HOSPITAL_COMMUNITY): Payer: Self-pay | Admitting: Cardiology

## 2020-02-16 ENCOUNTER — Encounter: Payer: Self-pay | Admitting: Family Medicine

## 2020-02-16 ENCOUNTER — Ambulatory Visit (INDEPENDENT_AMBULATORY_CARE_PROVIDER_SITE_OTHER): Payer: Medicare Other | Admitting: Family Medicine

## 2020-02-16 ENCOUNTER — Other Ambulatory Visit: Payer: Self-pay

## 2020-02-16 VITALS — BP 93/58 | HR 72 | Temp 97.7°F | Ht 72.0 in | Wt 267.0 lb

## 2020-02-16 DIAGNOSIS — M7661 Achilles tendinitis, right leg: Secondary | ICD-10-CM | POA: Diagnosis not present

## 2020-02-16 DIAGNOSIS — R531 Weakness: Secondary | ICD-10-CM

## 2020-02-16 DIAGNOSIS — N186 End stage renal disease: Secondary | ICD-10-CM

## 2020-02-16 DIAGNOSIS — L299 Pruritus, unspecified: Secondary | ICD-10-CM

## 2020-02-16 DIAGNOSIS — F4323 Adjustment disorder with mixed anxiety and depressed mood: Secondary | ICD-10-CM

## 2020-02-16 DIAGNOSIS — Z992 Dependence on renal dialysis: Secondary | ICD-10-CM

## 2020-02-16 MED ORDER — SERTRALINE HCL 50 MG PO TABS: 50.0000 mg | ORAL_TABLET | Freq: Every day | ORAL | 3 refills | Status: DC

## 2020-02-16 MED ORDER — TRIAMCINOLONE ACETONIDE 0.1 % EX CREA
1.0000 | TOPICAL_CREAM | Freq: Two times a day (BID) | CUTANEOUS | 0 refills | Status: AC | PRN
Start: 2020-02-16 — End: ?

## 2020-02-16 NOTE — Progress Notes (Signed)
7/27/20214:32 PM  Alex Williams 1950/10/03, 69 y.o., male [id number]  Chief Complaint  Patient presents with  . Hospitalization Follow-up    shortness of breath better today   . Chronic Kidney Disease    dialysis questions and medication refills   . Insomnia    itching on back on chest     HPI:   Patient is a 69 y.o. male with past medical history significant for HTN, HLP, gout,ESRDon HD, MI s/p 3V CABG, knee OA who presents today for hosp followup  hosp 7/14-18 for NSTEMI - demand ischemia in setting of fluid overload, resumed elqiuis for afib (dc ASA) Left heart cath:  1. Patent bypass grafts with no interventional target.  2. There are sources for ischemia => PLV branch circulation, PLOM distal to the SVG-PLOM touchdown, mid to distal native LAD territory.  Therefore, anginal chest pain could certainly occur.  3. I suspect that the event triggering admission was likely volume overload from inadequate dialysis, this led to LV stretch with demand ischemia and elevation in HS-TnI.  Home health ordered, d/c RW with 5" wheels  Patient reports he is overall doing ok He has not had anymore chest pain He continues to struggle with HD, feels that he gets very nauseous/vomits whenever they remove more than 2L of fluid He has upcoming appt with cards in aug He has upcoming appt with renal transplant in [place] He had a mechanical fall last week, doing ok except for knots in his head, no headaches, vision changes, LOC He is using walker He has noticed decreased bruising since d/c ASA He is having issues with depression and anxiety for past several weeks, struggling with coping with changes in health, requesting med He is having pain in right post heel, mostly when he wakes up He is having very itchy skin: back, chest and outer thighs, no rash, using lotion provides partial relief  Spoke with HD center, as patient reports being wo calcitrol and sensipar for over past several  months Calcitriol was stopped temporrarily as calcium was getting high She will call in sensipar to company pharamcy  Depression screen [facility] 2/9 01/21/2020 12/18/2019 10/20/2019  Decreased Interest 0 0 0  Down, Depressed, Hopeless 0 0 0  PHQ - 2 Score 0 0 0  Altered sleeping - - -  Tired, decreased energy - - -  Change in appetite - - -  Feeling bad or failure about yourself  - - -  Trouble concentrating - - -  Moving slowly or fidgety/restless - - -  Suicidal thoughts - - -  PHQ-9 Score - - -  Difficult doing work/chores - - -  Some recent data might be hidden    Fall Risk  02/16/2020 01/21/2020 01/01/2020 12/18/2019 10/20/2019  Falls in the past year? 1 0 0 1 0  Number falls in past yr: 0 0 0 1 0  Comment - - - - -  Injury with Fall? 1 0 0 0 0  Comment - - - - -  Risk for fall due to : - - - - Impaired mobility  Follow up Falls evaluation completed Falls evaluation completed - - -     Allergies  Allergen Reactions  . Codeine Phosphate Other (See Comments)    Hyperactive     Prior to Admission medications   Medication Sig Start Date End Date Taking? Authorizing Provider  acetaminophen (TYLENOL) 500 MG tablet Take 1,000 mg by mouth daily as needed for mild pain.  Yes [provider]  allopurinol (ZYLOPRIM) 100 MG tablet Take 1 tablet (100 mg total) by mouth daily. 05/08/19  Yes Angiulli, Lavon Paganini, PA-C  amiodarone (PACERONE) 200 MG tablet TAKE 1 TABLET BY MOUTH EVERY DAY Patient taking differently: Take 200 mg by mouth daily.  01/26/20  Yes Larey Dresser, MD  bisoprolol (ZEBETA) 5 MG tablet TAKE 1 TABLET AS DIRECTED ON NON DIALYSIS TUES,THURS,SAT,SUN NEEDS APPT Patient taking differently: Take 5 mg by mouth as directed. Take 1 tablet as directed on non dialysis (Tues, Thurs, Sat, Sun) needs appt 11/26/19  Yes Larey Dresser, MD  calcitRIOL (ROCALTROL) 0.25 MCG capsule Take 1 capsule (0.25 mcg total) by mouth every Monday, Wednesday, and Friday with hemodialysis. 12/30/19   Yes Seawell, Jaimie A, DO  cetirizine (ZYRTEC) 10 MG tablet Take 10 mg by mouth daily as needed for allergies.   Yes [provider]  cinacalcet (SENSIPAR) 30 MG tablet Take 1 tablet (30 mg total) by mouth daily with supper. 01/21/20  Yes Posey Boyer, MD  diphenhydramine-acetaminophen (TYLENOL PM) 25-500 MG TABS tablet Take 1 tablet by mouth at bedtime as needed (sleep).    Yes [provider]  ELIQUIS 5 MG TABS tablet TAKE 1 TABLET BY MOUTH TWICE A DAY Patient taking differently: Take 5 mg by mouth 2 (two) times daily.  10/21/19  Yes Larey Dresser, MD  ferric citrate (AURYXIA) 1 GM 210 MG(Fe) tablet Take 420 mg by mouth 3 (three) times daily with meals.    Yes [provider]  fluticasone (FLONASE) 50 MCG/ACT nasal spray Place 1 spray into both nostrils daily as needed for allergies or rhinitis.   Yes [provider]  gabapentin (NEURONTIN) 100 MG capsule TAKE 1 CAPSULE AT BEDTIME ON HD DAYS (M,W,F) Patient taking differently: Take 100 mg by mouth every Monday, Wednesday, and Friday. Take on dialysis days 12/17/19  Yes [name], MD  HYDROcodone-acetaminophen (NORCO/VICODIN) 5-325 MG tablet Take 1 tablet by mouth every 6 (six) hours as needed for moderate pain. 01/01/20  Yes [name], MD  methocarbamol (ROBAXIN) 500 MG tablet Take 1 tablet (500 mg total) by mouth every 8 (eight) hours as needed for muscle spasms. 01/01/20  Yes [name], MD  midodrine (PROAMATINE) 10 MG tablet Take 1 tablet (10 mg total) by mouth every Monday, Wednesday, and Friday with hemodialysis. 05/08/19  Yes Angiulli, Lavon Paganini, PA-C  Multiple Vitamins-Minerals (CENTRUM SILVER 50+MEN) TABS Take 1 tablet by mouth daily.   Yes [provider]  multivitamin (RENA-VIT) TABS tablet Take 1 tablet by mouth at bedtime. 05/07/19  Yes Angiulli, Lavon Paganini, PA-C  nitroGLYCERIN (NITROSTAT) 0.4 MG SL tablet Place 1 tablet (0.4 mg total) under the tongue every 5 (five) minutes  as needed for chest pain. 02/07/20  Yes Sheikh, Omair Latif, DO  ondansetron (ZOFRAN-ODT) 8 MG disintegrating tablet Take 1 tablet (8 mg total) by mouth every 8 (eight) hours as needed for nausea or vomiting. 10/20/19  Yes [name], MD  pantoprazole (PROTONIX) 40 MG tablet Take 1 tablet (40 mg total) by mouth daily as needed (acid reflux). 01/21/20  Yes Posey Boyer, MD  promethazine (PHENERGAN) 12.5 MG tablet Take 1 tablet (12.5 mg total) by mouth every 8 (eight) hours as needed for nausea or vomiting. 01/01/20  Yes [name], MD  rosuvastatin (CRESTOR) 10 MG tablet Take 1 tablet (10 mg total) by mouth daily. 09/29/19 02/04/20  Larey Dresser, MD  Past Medical History:  Diagnosis Date  . Cervical disc disease   . Chronic kidney disease   . COLONIC POLYPS, HX OF 06/27/2007  . EXOGENOUS OBESITY 01/30/2010  . GERD (gastroesophageal reflux disease)    PMH  . GOUT 01/30/2010  . HYPERCHOLESTEROLEMIA 06/30/2007  . HYPERTENSION 06/27/2007  . LOW BACK PAIN 06/27/2007  . NEPHROLITHIASIS, HX OF 06/27/2007  . OSTEOARTHRITIS 06/27/2007  . SLEEP APNEA, OBSTRUCTIVE, MODERATE 01/27/2009  . Wears dentures    upper    Past Surgical History:  Procedure Laterality Date  . AV FISTULA PLACEMENT Right 01/19/2019   Procedure: RIGHT BRACHIOCEPHALIC ARTERIOVENOUS (AV) FISTULA CREATION;  Surgeon: Angelia Mould, MD;  Location: [place];  Service: Vascular;  Laterality: Right;  . CARDIAC CATHETERIZATION    . CARPAL TUNNEL RELEASE     left hand  . COLONOSCOPY     with polypectomy  . CORONARY ARTERY BYPASS GRAFT N/A 04/16/2019   Procedure: CORONARY ARTERY BYPASS GRAFTING (CABG)X3  , WITH ENDOSCOPIC HARVESTING OF RIGHT GREATER SAPHENOUS VEIN;  Surgeon: Lajuana Matte, MD;  Location: [place];  Service: Open Heart Surgery;  Laterality: N/A;  . CORONARY/GRAFT ACUTE MI REVASCULARIZATION N/A 04/16/2019   Procedure: CORONARY/GRAFT ACUTE MI REVASCULARIZATION;  Surgeon: Burnell Blanks, MD;   Location: [place] [facility];  Service: Cardiovascular;  Laterality: N/A;  . INSERTION OF DIALYSIS CATHETER N/A 04/29/2019   Procedure: INSERTION OF TUNNELED DIALYSIS CATHETER, right internal jugular;  Surgeon: Rosetta Posner, MD;  Location: [place];  Service: Vascular;  Laterality: N/A;  . IR US GUIDE BX ASP/DRAIN  12/29/2019  . JOINT REPLACEMENT Left 2005   knee  . LEFT HEART CATH AND CORS/GRAFTS ANGIOGRAPHY N/A 02/05/2020   Procedure: LEFT HEART CATH AND CORS/GRAFTS ANGIOGRAPHY;  Surgeon: Larey Dresser, MD;  Location: [place] [facility];  Service: Cardiovascular;  Laterality: N/A;  . LUMBAR LAMINECTOMY    . MULTIPLE TOOTH EXTRACTIONS    . PERIPHERAL VASCULAR BALLOON ANGIOPLASTY Right 06/25/2019   Procedure: PERIPHERAL VASCULAR BALLOON ANGIOPLASTY;  Surgeon: Marty Heck, MD;  Location: [place] [facility];  Service: Cardiovascular;  Laterality: Right;  . RADIOFREQUENCY ABLATION KIDNEY    . RIGHT/LEFT HEART CATH AND CORONARY ANGIOGRAPHY N/A 04/16/2019   Procedure: RIGHT/LEFT HEART CATH AND CORONARY ANGIOGRAPHY;  Surgeon: Burnell Blanks, MD;  Location: [place] [facility];  Service: Cardiovascular;  Laterality: N/A;  . TEE WITHOUT CARDIOVERSION N/A 04/16/2019   Procedure: TRANSESOPHAGEAL ECHOCARDIOGRAM (TEE);  Surgeon: Lajuana Matte, MD;  Location: [place];  Service: Open Heart Surgery;  Laterality: N/A;  . TEE WITHOUT CARDIOVERSION N/A 10/08/2019   Procedure: TRANSESOPHAGEAL ECHOCARDIOGRAM (TEE);  Surgeon: Larey Dresser, MD;  Location: [facility];  Service: Cardiovascular;  Laterality: N/A;  . TOTAL KNEE ARTHROPLASTY     left  . VENTRICULAR ASSIST DEVICE INSERTION N/A 04/16/2019   Procedure: VENTRICULAR ASSIST DEVICE INSERTION;  Surgeon: Burnell Blanks, MD;  Location: [place] [facility];  Service: Cardiovascular;  Laterality: N/A;    Social History   Tobacco Use  . Smoking status: Former Smoker    Types: Cigarettes    Quit date: 07/23/1980    Years since  quitting: 39.5  . Smokeless tobacco: Never Used  Substance Use Topics  . Alcohol use: Yes    Alcohol/week: 1.0 standard drink    Types: 1 Cans of beer per week    Comment: rare    Family History  Problem Relation Age of Onset  . Hypertension Other   .  Diabetes Sister   . Hyperlipidemia Sister   . Sleep apnea Sister     Review of Systems  Constitutional: Negative for chills and fever.  Respiratory: Negative for cough and shortness of breath.   Cardiovascular: Positive for leg swelling. Negative for chest pain and palpitations.  Gastrointestinal: Positive for nausea and vomiting. Negative for abdominal pain.   Per hpi  OBJECTIVE:  Today's Vitals   02/16/20 1621  BP: (!) 93/58  Pulse: 72  Temp: 97.7 F (36.5 C)  SpO2: 97%  Weight: (!) 267 lb (121.1 kg)  Height: 6' (1.829 m)   Body mass index is 36.21 kg/m.   Physical Exam Vitals and nursing note reviewed.  Constitutional:      Appearance: He is well-developed.  HENT:     Head: Normocephalic and atraumatic.  Eyes:     Extraocular Movements: Extraocular movements intact.     Conjunctiva/sclera: Conjunctivae normal.     Pupils: Pupils are equal, round, and reactive to light.  Cardiovascular:     Rate and Rhythm: Normal rate and regular rhythm.     Heart sounds: No murmur heard.  No friction rub. No gallop.   Pulmonary:     Effort: Pulmonary effort is normal.     Breath sounds: Normal breath sounds. No wheezing, rhonchi or rales.  Musculoskeletal:     Cervical back: Neck supple.     Right lower leg: Edema (+1 pitting edema) present.     Left lower leg: Edema present.  Skin:    General: Skin is warm and dry.  Neurological:     Mental Status: He is alert and oriented to person, place, and time.     No results found for this or any previous visit (from the past 24 hour(s)).  No results found.   ASSESSMENT and PLAN  1. ESRD (end stage renal disease) on dialysis ([place]) HD: M/W/F. Discussed importance of  fluid and salt restrictions. Clarified meds for patient. Upcoming eval for transplant. Managed by renal.  - CBC - Comprehensive metabolic panel  2. Adjustment reaction with anxiety and depression New diagnosis. Starting low dose sertraline, discussed titration, reviewed r/se/b  3. Achilles tendinitis of right lower extremity - Ambulatory referral to Podiatry  4. Itchy skin Cont with moisturizers, trimainolone prn, reviewed r/se/b  Other orders - sertraline (ZOLOFT) 50 MG tablet; Take 1 tablet (50 mg total) by mouth daily. - triamcinolone cream (KENALOG) 0.1 %; Apply 1 application topically 2 (two) times daily as needed.  Return in about 6 weeks (around 03/29/2020).    [name], MD Primary Care at [facility] [place], [place] [address] Ph.  [phone] Fax [phone]

## 2020-02-16 NOTE — Patient Instructions (Addendum)
For anxiety and depression: start sertraline 1/2 tab once a day for 2 weeks, then can increase to 1 tab daily     If you have lab work done today you will be contacted with your lab results within the next 2 weeks.  If you have not heard from [place] then please contact us. The fastest way to get your results is to register for My Chart.   IF you received an x-ray today, you will receive an invoice from [facility]. Please contact [facility] at [phone] with questions or concerns regarding your invoice.   IF you received labwork today, you will receive an invoice from [place]. Please contact [facility] at [phone] with questions or concerns regarding your invoice.   Our billing staff will not be able to assist you with questions regarding bills from these companies.  You will be contacted with the lab results as soon as they are available. The fastest way to get your results is to activate your My Chart account. Instructions are located on the last page of this paperwork. If you have not heard from [place] regarding the results in 2 weeks, please contact this office.

## 2020-02-17 ENCOUNTER — Telehealth: Payer: Self-pay | Admitting: Family Medicine

## 2020-02-17 LAB — CBC
Hematocrit: 32 % — ABNORMAL LOW (ref 37.5–51.0)
Hemoglobin: 9.7 g/dL — ABNORMAL LOW (ref 13.0–17.7)
MCH: 27.2 pg (ref 26.6–33.0)
MCHC: 30.3 g/dL — ABNORMAL LOW (ref 31.5–35.7)
MCV: 90 fL (ref 79–97)
Platelets: 210 10*3/uL (ref 150–450)
RBC: 3.56 x10E6/uL — ABNORMAL LOW (ref 4.14–5.80)
RDW: 18.4 % — ABNORMAL HIGH (ref 11.6–15.4)
WBC: 7.3 10*3/uL (ref 3.4–10.8)

## 2020-02-17 LAB — COMPREHENSIVE METABOLIC PANEL
ALT: 13 IU/L (ref 0–44)
AST: 23 IU/L (ref 0–40)
Albumin/Globulin Ratio: 1.6 (ref 1.2–2.2)
Albumin: 3.5 g/dL — ABNORMAL LOW (ref 3.8–4.8)
Alkaline Phosphatase: 125 IU/L — ABNORMAL HIGH (ref 48–121)
BUN/Creatinine Ratio: 4 — ABNORMAL LOW (ref 10–24)
BUN: 20 mg/dL (ref 8–27)
Bilirubin Total: 1.1 mg/dL (ref 0.0–1.2)
CO2: 32 mmol/L — ABNORMAL HIGH (ref 20–29)
Calcium: 10.5 mg/dL — ABNORMAL HIGH (ref 8.6–10.2)
Chloride: 95 mmol/L — ABNORMAL LOW (ref 96–106)
Creatinine, Ser: 5.45 mg/dL (ref 0.76–1.27)
GFR calc Af Amer: 11 mL/min/{1.73_m2} — ABNORMAL LOW (ref >59)
GFR calc non Af Amer: 10 mL/min/{1.73_m2} — ABNORMAL LOW (ref >59)
Globulin, Total: 2.2 g/dL (ref 1.5–4.5)
Glucose: 88 mg/dL (ref 65–99)
Potassium: 4.1 mmol/L (ref 3.5–5.2)
Sodium: 140 mmol/L (ref 134–144)
Total Protein: 5.7 g/dL — ABNORMAL LOW (ref 6.0–8.5)

## 2020-02-17 NOTE — Telephone Encounter (Signed)
Received a fax after hours and it stated that pt received a critical lab result. Please advise.

## 2020-02-17 NOTE — Telephone Encounter (Signed)
What was the critical lab results for this pt so I can get it to a provider.  Please Advise

## 2020-02-17 NOTE — Telephone Encounter (Signed)
ESRD on HD

## 2020-02-17 NOTE — Telephone Encounter (Signed)
Critical Lab results Creatinine 5.45.  Please Advise

## 2020-02-18 ENCOUNTER — Telehealth: Payer: Self-pay | Admitting: Family Medicine

## 2020-02-18 NOTE — Telephone Encounter (Signed)
Patient spoke to  [facility] about OT order  and hospital was supposed to have sent.Marland Kitchen  [employer] did not receive . And patient states that Dr. Pamella Pert is supposed to send and order in as well. Patient prefers Dr. Pamella Pert to send to  Request for OT [facility]    Please reach out to patient

## 2020-02-18 NOTE — Telephone Encounter (Signed)
Looking through your recent office visit I do not see any mention about a referral for OT. I do see a referral for Podiatry. Please advise if you have any order request from [facility] for OT. If not can we put in an order for OT if so please give direction .

## 2020-02-21 ENCOUNTER — Other Ambulatory Visit (HOSPITAL_COMMUNITY): Payer: Self-pay | Admitting: Cardiology

## 2020-02-22 ENCOUNTER — Telehealth: Payer: Self-pay | Admitting: Family Medicine

## 2020-02-22 NOTE — Telephone Encounter (Signed)
Spoke with patient and he stated he has not begun [place] service at this time. Patient is staying with his daughter in [place]. After researching the order for OT and Pt was canceled by the hospital. Spoke with DR Pamella Pert and ok to put in a referral for PT. Home health order has been placed and referral has been faxed to [facility] [place]. Phone number [phone] and fax [phone].

## 2020-02-22 NOTE — Telephone Encounter (Signed)
Hh ordered by hospital, please investigate. thanks

## 2020-02-22 NOTE — Telephone Encounter (Addendum)
02/22/2020 - PATIENT'S DAUGHTER (MARGARET Klausing) DROPPED OFF DISABILITY FORMS FOR DR. IRMA TO FILL OUT. MR. Lackie GAVE HIS VERBAL AUTHORIZATION OVER THE TELEPHONE DUE TO HIM TAKING DIALYSIS. I WILL PUT THE FORMS IN DR. Gearldine Shown CUBBY HOLE AT THE NURSES STATION FOR COMPLETION. PLEASE CALL HIS DAUGHTER WHEN THE FORMS ARE READY TO BE PICKED UP. SHE IS ALSO ON HIS 2020 HIPAA FORM. HER PHONE NUMBER IS [phone]. [place]

## 2020-02-22 NOTE — Addendum Note (Signed)
Addended by: Norton Blizzard R on: 0/09/5595 11:43 AM   Modules accepted: Orders

## 2020-02-22 NOTE — Telephone Encounter (Signed)
Please inform once forms are complete

## 2020-02-22 NOTE — Telephone Encounter (Signed)
Alex Williams  from [facility] calling they don't have a physical therapist available so they need to decline him .   If they need to call her number [phone]

## 2020-02-23 ENCOUNTER — Telehealth: Payer: Self-pay

## 2020-02-23 NOTE — Telephone Encounter (Signed)
Received patient disability forms, given to doctor for completion

## 2020-02-23 NOTE — Telephone Encounter (Signed)
Message received from [place] , currently working on finding patient a [place] agency that takes his insurance .

## 2020-02-23 NOTE — Telephone Encounter (Signed)
Forms have been competed and have been faxed. A copy of the forms are at the nurse station in the red folder.

## 2020-02-25 ENCOUNTER — Encounter: Payer: Self-pay | Admitting: Podiatry

## 2020-02-25 ENCOUNTER — Ambulatory Visit (INDEPENDENT_AMBULATORY_CARE_PROVIDER_SITE_OTHER): Payer: Medicare Other

## 2020-02-25 ENCOUNTER — Other Ambulatory Visit: Payer: Self-pay

## 2020-02-25 ENCOUNTER — Ambulatory Visit: Payer: Medicare Other | Admitting: Podiatry

## 2020-02-25 DIAGNOSIS — L03119 Cellulitis of unspecified part of limb: Secondary | ICD-10-CM | POA: Diagnosis not present

## 2020-02-25 DIAGNOSIS — M7661 Achilles tendinitis, right leg: Secondary | ICD-10-CM

## 2020-02-25 DIAGNOSIS — L02619 Cutaneous abscess of unspecified foot: Secondary | ICD-10-CM | POA: Diagnosis not present

## 2020-02-25 DIAGNOSIS — I824Y1 Acute embolism and thrombosis of unspecified deep veins of right proximal lower extremity: Secondary | ICD-10-CM

## 2020-02-25 LAB — CBC WITH DIFFERENTIAL/PLATELET
Absolute Monocytes: 800 cells/uL (ref 200–950)
Basophils Absolute: 70 cells/uL (ref 0–200)
Basophils Relative: 1.1 %
Eosinophils Absolute: 38 cells/uL (ref 15–500)
Eosinophils Relative: 0.6 %
HCT: 34 % — ABNORMAL LOW (ref 38.5–50.0)
Hemoglobin: 10.7 g/dL — ABNORMAL LOW (ref 13.2–17.1)
Lymphs Abs: 1766 cells/uL (ref 850–3900)
MCH: 28.9 pg (ref 27.0–33.0)
MCHC: 31.5 g/dL — ABNORMAL LOW (ref 32.0–36.0)
MCV: 91.9 fL (ref 80.0–100.0)
MPV: 9.9 fL (ref 7.5–12.5)
Monocytes Relative: 12.5 %
Neutro Abs: 3725 cells/uL (ref 1500–7800)
Neutrophils Relative %: 58.2 %
Platelets: 212 10*3/uL (ref 140–400)
RBC: 3.7 10*6/uL — ABNORMAL LOW (ref 4.20–5.80)
RDW: 17.2 % — ABNORMAL HIGH (ref 11.0–15.0)
Total Lymphocyte: 27.6 %
WBC: 6.4 10*3/uL (ref 3.8–10.8)

## 2020-02-25 NOTE — Telephone Encounter (Signed)
Forms completed and ready to be picked up

## 2020-02-26 ENCOUNTER — Telehealth: Payer: Self-pay | Admitting: *Deleted

## 2020-02-26 DIAGNOSIS — I824Y1 Acute embolism and thrombosis of unspecified deep veins of right proximal lower extremity: Secondary | ICD-10-CM

## 2020-02-26 NOTE — Telephone Encounter (Signed)
I spoke with [facility] - Alex Williams transferred to Alex Williams states the D. Dimer orders are not seen in their computer and they have no blood or orders in their system and the D-dimer has to be drawn in a light blue top tube and can not be uncapped, must be at room temperature or refrigerated. Akima recommended pt have the lab drawn at a [place] facility.

## 2020-02-26 NOTE — Telephone Encounter (Signed)
I spoke with [place] she states a new lab order would need to be placed. I placed the order.

## 2020-02-26 NOTE — Progress Notes (Signed)
Subjective:  Patient ID: Alex Williams, male    DOB: 10-09-1950,  MRN: [id number] HPI Chief Complaint  Patient presents with   Foot Pain    Patient states he had an infection around the hallux nail right for few weeks, whole foot and ankle was swollen and red, PCP thought cellulitis - has been on antibiotics, oral and IV (was in hospital), now that infection is better in toe, the ankle and achilles is still very sore and swollen   New Patient (Initial Visit)    69 y.o. male presents with the above complaint.   ROS: Denies fever chills nausea vomiting muscle aches pains calf pain left positive calf pain right back pain chest pain shortness of breath also included.  He states that his right calf is been bothering him more recently.  And he has noticed shortness of breath.  He currently takes Eliquis.  Was recently in the hospital for cellulitis.  Past Medical History:  Diagnosis Date   Cervical disc disease    Chronic kidney disease    COLONIC POLYPS, HX OF 06/27/2007   EXOGENOUS OBESITY 01/30/2010   GERD (gastroesophageal reflux disease)    PMH   GOUT 01/30/2010   HYPERCHOLESTEROLEMIA 06/30/2007   HYPERTENSION 06/27/2007   LOW BACK PAIN 06/27/2007   NEPHROLITHIASIS, HX OF 06/27/2007   OSTEOARTHRITIS 06/27/2007   SLEEP APNEA, OBSTRUCTIVE, MODERATE 01/27/2009   Wears dentures    upper   Past Surgical History:  Procedure Laterality Date   AV FISTULA PLACEMENT Right 01/19/2019   Procedure: RIGHT BRACHIOCEPHALIC ARTERIOVENOUS (AV) FISTULA CREATION;  Surgeon: Angelia Mould, MD;  Location: [facility] OR;  Service: Vascular;  Laterality: Right;   CARDIAC CATHETERIZATION     CARPAL TUNNEL RELEASE     left hand   COLONOSCOPY     with polypectomy   CORONARY ARTERY BYPASS GRAFT N/A 04/16/2019   Procedure: CORONARY ARTERY BYPASS GRAFTING (CABG)X3  , WITH ENDOSCOPIC HARVESTING OF RIGHT GREATER [place];  Surgeon: Lajuana Matte, MD;  Location: [place];  Service: Open  Heart Surgery;  Laterality: N/A;   CORONARY/GRAFT ACUTE MI REVASCULARIZATION N/A 04/16/2019   Procedure: CORONARY/GRAFT ACUTE MI REVASCULARIZATION;  Surgeon: Burnell Blanks, MD;  Location: [place] [facility];  Service: Cardiovascular;  Laterality: N/A;   INSERTION OF DIALYSIS CATHETER N/A 04/29/2019   Procedure: INSERTION OF TUNNELED DIALYSIS CATHETER, right internal jugular;  Surgeon: Rosetta Posner, MD;  Location: [place];  Service: Vascular;  Laterality: N/A;   IR US GUIDE BX ASP/DRAIN  12/29/2019   JOINT REPLACEMENT Left 2005   knee   LEFT HEART CATH AND CORS/GRAFTS ANGIOGRAPHY N/A 02/05/2020   Procedure: LEFT HEART CATH AND CORS/GRAFTS ANGIOGRAPHY;  Surgeon: Larey Dresser, MD;  Location: [place] [facility];  Service: Cardiovascular;  Laterality: N/A;   LUMBAR LAMINECTOMY     MULTIPLE TOOTH EXTRACTIONS     PERIPHERAL VASCULAR BALLOON ANGIOPLASTY Right 06/25/2019   Procedure: PERIPHERAL VASCULAR BALLOON ANGIOPLASTY;  Surgeon: Marty Heck, MD;  Location: [place] [facility];  Service: Cardiovascular;  Laterality: Right;   RADIOFREQUENCY ABLATION KIDNEY     RIGHT/LEFT HEART CATH AND CORONARY ANGIOGRAPHY N/A 04/16/2019   Procedure: RIGHT/LEFT HEART CATH AND CORONARY ANGIOGRAPHY;  Surgeon: Burnell Blanks, MD;  Location: [place] [facility];  Service: Cardiovascular;  Laterality: N/A;   TEE WITHOUT CARDIOVERSION N/A 04/16/2019   Procedure: TRANSESOPHAGEAL ECHOCARDIOGRAM (TEE);  Surgeon: Lajuana Matte, MD;  Location: [place];  Service: Open Heart Surgery;  Laterality:  N/A;   TEE WITHOUT CARDIOVERSION N/A 10/08/2019   Procedure: TRANSESOPHAGEAL ECHOCARDIOGRAM (TEE);  Surgeon: Larey Dresser, MD;  Location: [facility] END[facility]COPY;  Service: Cardiovascular;  Laterality: N/A;   TOTAL KNEE ARTHROPLASTY     left   VENTRICULAR ASSIST DEVICE INSERTION N/A 04/16/2019   Procedure: VENTRICULAR ASSIST DEVICE INSERTION;  Surgeon: Burnell Blanks, MD;  Location: [place]  [facility];  Service: Cardiovascular;  Laterality: N/A;    Current Outpatient Medications:    Methoxy PEG-Epoetin Beta (MIRCERA IJ), Mircera, Disp: , Rfl:    acetaminophen (TYLENOL) 500 MG tablet, Take 1,000 mg by mouth daily as needed for mild pain., Disp: , Rfl:    allopurinol (ZYLOPRIM) 100 MG tablet, Take 1 tablet (100 mg total) by mouth daily., Disp: 30 tablet, Rfl: 0   amiodarone (PACERONE) 200 MG tablet, TAKE 1 TABLET BY MOUTH EVERY DAY (Patient taking differently: Take 200 mg by mouth daily. ), Disp: 90 tablet, Rfl: 3   bisoprolol (ZEBETA) 5 MG tablet, Take 1 tablet as directed on non dialysis tues,thurs,sat,sun, Disp: 90 tablet, Rfl: 0   calcitRIOL (ROCALTROL) 0.25 MCG capsule, Take 1 capsule (0.25 mcg total) by mouth every Monday, Wednesday, and Friday with hemodialysis., Disp: 30 capsule, Rfl: 0   cetirizine (ZYRTEC) 10 MG tablet, Take 10 mg by mouth daily as needed for allergies., Disp: , Rfl:    cinacalcet (SENSIPAR) 30 MG tablet, Take 1 tablet (30 mg total) by mouth daily with supper., Disp: 60 tablet, Rfl: 0   diphenhydramine-acetaminophen (TYLENOL PM) 25-500 MG TABS tablet, Take 1 tablet by mouth at bedtime as needed (sleep). , Disp: , Rfl:    ELIQUIS 5 MG TABS tablet, TAKE 1 TABLET BY MOUTH TWICE A DAY (Patient taking differently: Take 5 mg by mouth 2 (two) times daily. ), Disp: 180 tablet, Rfl: 3   ferric citrate (AURYXIA) 1 GM 210 MG(Fe) tablet, Take 420 mg by mouth 3 (three) times daily with meals. , Disp: , Rfl:    fluticasone (FLONASE) 50 MCG/ACT nasal spray, Place 1 spray into both nostrils daily as needed for allergies or rhinitis., Disp: , Rfl:    gabapentin (NEURONTIN) 100 MG capsule, TAKE 1 CAPSULE AT BEDTIME ON HD DAYS (M,W,F) (Patient taking differently: Take 100 mg by mouth every Monday, Wednesday, and Friday. Take on dialysis days), Disp: 30 capsule, Rfl: 2   midodrine (PROAMATINE) 10 MG tablet, Take 1 tablet (10 mg total) by mouth every Monday, Wednesday,  and Friday with hemodialysis., Disp: 30 tablet, Rfl: 0   Multiple Vitamins-Minerals (CENTRUM SILVER 50+MEN) TABS, Take 1 tablet by mouth daily., Disp: , Rfl:    multivitamin (RENA-VIT) TABS tablet, Take 1 tablet by mouth at bedtime., Disp: 30 tablet, Rfl: 0   nitroGLYCERIN (NITROSTAT) 0.4 MG SL tablet, Place 1 tablet (0.4 mg total) under the tongue every 5 (five) minutes as needed for chest pain., Disp: 30 tablet, Rfl: 0   ondansetron (ZOFRAN-ODT) 8 MG disintegrating tablet, Take 1 tablet (8 mg total) by mouth every 8 (eight) hours as needed for nausea or vomiting., Disp: 20 tablet, Rfl: 5   pantoprazole (PROTONIX) 40 MG tablet, Take 1 tablet (40 mg total) by mouth daily as needed (acid reflux)., Disp: 30 tablet, Rfl: 3   pregabalin (LYRICA) 100 MG capsule, Take by mouth., Disp: , Rfl:    promethazine (PHENERGAN) 12.5 MG tablet, Take 1 tablet (12.5 mg total) by mouth every 8 (eight) hours as needed for nausea or vomiting., Disp: 20 tablet, Rfl: 0  rosuvastatin (CRESTOR) 10 MG tablet, Take 1 tablet (10 mg total) by mouth daily., Disp: 90 tablet, Rfl: 3   sertraline (ZOLOFT) 50 MG tablet, Take 1 tablet (50 mg total) by mouth daily., Disp: 30 tablet, Rfl: 3   triamcinolone cream (KENALOG) 0.1 %, Apply 1 application topically 2 (two) times daily as needed., Disp: 45 g, Rfl: 0   VISINE 0.05 % ophthalmic solution, 1 drop, Disp: , Rfl:   Allergies  Allergen Reactions   Codeine Phosphate Other (See Comments)    Hyperactive    Review of Systems Objective:  There were no vitals filed for this visit.  General: Well developed, nourished, in no acute distress, alert and oriented x3   Dermatological: Skin is warm, dry and supple bilateral. Nails x 10 are well maintained; remaining integument appears unremarkable at this time. There are no open sores, no preulcerative lesions, no rash or signs of infection present.  Vascular: Dorsalis Pedis artery and Posterior Tibial artery pedal pulses are  2/4 bilateral with immedate capillary fill time. Pedal hair growth present. No varicosities and no lower extremity edema present bilateral.  Positive calf pain with warmth on palpation central calf.  Neruologic: Grossly intact via light touch bilateral. Vibratory intact via tuning fork bilateral. Protective threshold with Semmes Wienstein monofilament intact to all pedal sites bilateral. Patellar and Achilles deep tendon reflexes 2+ bilateral. No Babinski or clonus noted bilateral.   Musculoskeletal: No gross boney pedal deformities bilateral. No pain, crepitus, or limitation noted with foot and ankle range of motion bilateral. Muscular strength 5/5 in all groups tested bilateral.  No true Achilles tendinitis or Achilles pain.  Positive calf pain centrally located.  Warm to the touch.  Gait: Unassisted, Nonantalgic.    Radiographs:  Radiographs taken today demonstrate significant thickening of the Achilles soft tissue swelling of the posterior aspect of the foot leg some calcification in the cutis and osteoarthritis of the ankle.  Assessment & Plan:   Assessment: Cannot rule out varicosity at this point DVT.  Cannot rule out muscle tear.  Cannot rule out edema that is resulting in pain.  Plan: Currently does not appear that there is any cellulitis present.  He does have paronychia of the hallux which is gone on to heal it appears.  We are going to send him over for CBC and a D-dimer.  We will await those results.  At some point we will work on his Cristie Hem however we will have to wait until we find that there are no problems with his blood clotting.     Emali Heyward T. [place], [place]

## 2020-02-26 NOTE — Telephone Encounter (Signed)
I informed pt the wrong blood specimen was drawn for the D-dimer. Pt states he had the labs drawn at [facility] yesterday.

## 2020-02-26 NOTE — Telephone Encounter (Signed)
[facility] and [place] states hold on and she will get me into the lab office, transferred to [place]. Felicia asked if she drew off his orders and I told her yes. Felicia states the D-Dimer was drawn and sent to [facility].

## 2020-02-29 NOTE — Telephone Encounter (Signed)
This matter has been handled, spoke with daughter whom is aware of form at front office for pick up

## 2020-03-01 ENCOUNTER — Telehealth: Payer: Self-pay | Admitting: *Deleted

## 2020-03-01 NOTE — Telephone Encounter (Signed)
Left message on pt's home to call with the date he would have the labs drawn that were ordered 02/26/2020.

## 2020-03-01 NOTE — Telephone Encounter (Signed)
I spoke with [facility] - Vickii Penna states pt did not have the D-dimer drawn.

## 2020-03-01 NOTE — Telephone Encounter (Signed)
-----   Message from Garrel Ridgel, [place] sent at 02/26/2020  4:27 PM EDT ----- Waiting on the D-dimer

## 2020-03-01 NOTE — Telephone Encounter (Signed)
I spoke with pt and he states he just hasn't had time to get the labs drawn, and will get it done Thursday. I told pt that it was detrimental that he get the lab performed as soon as possible and pt states he will get it done today.

## 2020-03-02 LAB — D-DIMER, QUANTITATIVE: D-Dimer, Quant: 11.72 mcg/mL FEU — ABNORMAL HIGH (ref <0.50)

## 2020-03-03 ENCOUNTER — Ambulatory Visit (INDEPENDENT_AMBULATORY_CARE_PROVIDER_SITE_OTHER): Payer: Medicare Other | Admitting: Family Medicine

## 2020-03-03 ENCOUNTER — Encounter: Payer: Self-pay | Admitting: Family Medicine

## 2020-03-03 ENCOUNTER — Other Ambulatory Visit: Payer: Self-pay

## 2020-03-03 ENCOUNTER — Telehealth: Payer: Self-pay | Admitting: *Deleted

## 2020-03-03 VITALS — BP 129/78 | HR 65 | Temp 97.9°F | Ht 72.0 in | Wt 259.0 lb

## 2020-03-03 DIAGNOSIS — F4323 Adjustment disorder with mixed anxiety and depressed mood: Secondary | ICD-10-CM | POA: Diagnosis not present

## 2020-03-03 DIAGNOSIS — N186 End stage renal disease: Secondary | ICD-10-CM

## 2020-03-03 DIAGNOSIS — Z992 Dependence on renal dialysis: Secondary | ICD-10-CM | POA: Diagnosis not present

## 2020-03-03 DIAGNOSIS — R531 Weakness: Secondary | ICD-10-CM

## 2020-03-03 MED ORDER — PROMETHAZINE HCL 12.5 MG PO TABS: 12.5000 mg | ORAL_TABLET | Freq: Three times a day (TID) | ORAL | 5 refills | Status: AC | PRN

## 2020-03-03 NOTE — Telephone Encounter (Signed)
-----   Message from Garrel Ridgel, [place] sent at 03/02/2020  7:45 AM EDT ----- Mateo Flow his D-dimer came back very elevated 11.72 when it should be less than 0.5.  At this point I highly recommend a ultrasound of his right calf.  But he was relating shortness of breath and chest pain the last time I saw him.  I think he needs to be followed up with cardiology vascular or his primary care doctor.  I would be concerned about a chronic PE.

## 2020-03-03 NOTE — Telephone Encounter (Signed)
Left message informing pt that due to the importance of the message I would be leaving it on his cellphone, I told pt that his D-dimer was elevated and that he had related chest pain and shortness of breath to Dr. Milinda Pointer and we were recommending he go to the ED now, if he chose not to he should call me tomorrow morning.

## 2020-03-03 NOTE — Patient Instructions (Signed)
° ° ° °  If you have lab work done today you will be contacted with your lab results within the next 2 weeks.  If you have not heard from us then please contact us. The fastest way to get your results is to register for My Chart. ° ° °IF you received an x-ray today, you will receive an invoice from [facility]. Please contact [facility] at [phone] with questions or concerns regarding your invoice.  ° °IF you received labwork today, you will receive an invoice from [facility]. Please contact [facility] at [phone] with questions or concerns regarding your invoice.  ° °Our billing staff will not be able to assist you with questions regarding bills from these companies. ° °You will be contacted with the lab results as soon as they are available. The fastest way to get your results is to activate your My Chart account. Instructions are located on the last page of this paperwork. If you have not heard from us regarding the results in 2 weeks, please contact this office. °  ° ° ° °

## 2020-03-03 NOTE — Progress Notes (Signed)
8/12/202110:06 AM  Alex Williams Dec 22, 1950, 69 y.o., male [id number]  Chief Complaint  Patient presents with   Depression    follow up     HPI:   Patient is a 69 y.o. 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 2/9 03/03/2020 03/03/2020 01/21/2020  Decreased Interest 3 0 0  Down, Depressed, Hopeless 2 0 0  PHQ - 2 Score 5 0 0  Altered sleeping 3 - -  Tired, decreased energy 3 - -  Change in appetite 3 - -  Feeling bad or failure about yourself  0 - -  Trouble concentrating 1 - -  Moving slowly or fidgety/restless 3 - -  Suicidal thoughts 0 - -  PHQ-9 Score 18 - -  Difficult doing work/chores Very difficult - -  Some recent data might be hidden   GAD 7 : Generalized Anxiety Score 03/03/2020 01/21/2020  Nervous, Anxious, on Edge 3 0  Control/stop worrying 3 0  Worry too much - different things 3 0  Trouble relaxing 3 0  Restless 3 0  Easily annoyed or irritable 2 0  Afraid - awful might happen 0 0  Total GAD 7 Score 17 0  Anxiety Difficulty Very difficult Not difficult at all     Fall Risk  03/03/2020 02/16/2020 01/21/2020 01/01/2020 12/18/2019  Falls in the past year? 0 1 0 0 1  Number falls in past yr: 0 0 0 0 1  Comment - - - - -  Injury with Fall? 0 1 0 0 0  Comment - - - - -    Risk for fall due to : - - - - -  Follow up Falls evaluation completed Falls evaluation completed Falls evaluation completed - -     Allergies  Allergen Reactions   Codeine Phosphate Other (See Comments)    Hyperactive     Prior to Admission medications   Medication Sig Start Date End Date Taking? Authorizing Provider  acetaminophen (TYLENOL) 500 MG tablet Take 1,000 mg by mouth daily as needed for mild pain.   Yes [provider]  allopurinol (ZYLOPRIM) 100 MG tablet Take 1 tablet (100 mg total) by mouth daily. 05/08/19  Yes Angiulli, Lavon Paganini, PA-C  amiodarone (PACERONE) 200 MG tablet TAKE 1 TABLET BY MOUTH EVERY DAY Patient taking differently: Take 200 mg by mouth daily.  01/26/20  Yes Larey Dresser, MD  bisoprolol (ZEBETA) 5 MG tablet Take 1 tablet as directed on non dialysis tues,thurs,sat,sun 02/22/20  Yes Larey Dresser, MD  calcitRIOL (ROCALTROL) 0.25 MCG capsule Take 1 capsule (0.25 mcg total) by mouth every Monday, Wednesday, and Friday with hemodialysis. 12/30/19  Yes Seawell, Jaimie A, DO  cetirizine (ZYRTEC) 10 MG tablet  Take 10 mg by mouth daily as needed for allergies.   Yes [provider]  cinacalcet (SENSIPAR) 90 MG tablet Take 90 mg by mouth daily.   Yes [provider]  diphenhydramine-acetaminophen (TYLENOL PM) 25-500 MG TABS tablet Take 1 tablet by mouth at bedtime as needed (sleep).    Yes [provider]  ELIQUIS 5 MG TABS tablet TAKE 1 TABLET BY MOUTH TWICE A DAY Patient taking differently: Take 5 mg by mouth 2 (two) times daily.  10/21/19  Yes Larey Dresser, MD  ferric citrate (AURYXIA) 1 GM 210 MG(Fe) tablet Take 420 mg by mouth 3 (three) times daily with meals.    Yes [provider]  fluticasone (FLONASE) 50 MCG/ACT nasal spray Place 1 spray into both nostrils daily as needed for allergies or rhinitis.   Yes [provider]  gabapentin (NEURONTIN) 100 MG capsule TAKE 1 CAPSULE AT BEDTIME ON HD DAYS  (M,W,F) Patient taking differently: Take 100 mg by mouth every Monday, Wednesday, and Friday. Take on dialysis days 12/17/19  Yes [name], MD  Methoxy PEG-Epoetin Beta (MIRCERA IJ) Mircera 02/24/20 02/22/21 Yes [provider]  midodrine (PROAMATINE) 10 MG tablet Take 1 tablet (10 mg total) by mouth every Monday, Wednesday, and Friday with hemodialysis. 05/08/19  Yes Angiulli, Lavon Paganini, PA-C  Multiple Vitamins-Minerals (CENTRUM SILVER 50+MEN) TABS Take 1 tablet by mouth daily.   Yes [provider]  multivitamin (RENA-VIT) TABS tablet Take 1 tablet by mouth at bedtime. 05/07/19  Yes Angiulli, Lavon Paganini, PA-C  nitroGLYCERIN (NITROSTAT) 0.4 MG SL tablet Place 1 tablet (0.4 mg total) under the tongue every 5 (five) minutes as needed for chest pain. 02/07/20  Yes Sheikh, Omair Latif, DO  ondansetron (ZOFRAN-ODT) 8 MG disintegrating tablet Take 1 tablet (8 mg total) by mouth every 8 (eight) hours as needed for nausea or vomiting. 10/20/19  Yes [name], MD  pantoprazole (PROTONIX) 40 MG tablet Take 1 tablet (40 mg total) by mouth daily as needed (acid reflux). 01/21/20  Yes Posey Boyer, MD  pregabalin (LYRICA) 100 MG capsule Take by mouth. 02/11/20  Yes [provider]  promethazine (PHENERGAN) 12.5 MG tablet Take 1 tablet (12.5 mg total) by mouth every 8 (eight) hours as needed for nausea or vomiting. 01/01/20  Yes [name], MD  sertraline (ZOLOFT) 50 MG tablet Take 1 tablet (50 mg total) by mouth daily. 02/16/20  Yes [name], MD  triamcinolone cream (KENALOG) 0.1 % Apply 1 application topically 2 (two) times daily as needed. 02/16/20  Yes [name], MD  VISINE 0.05 % ophthalmic solution 1 drop 02/11/20  Yes [provider]  rosuvastatin (CRESTOR) 10 MG tablet Take 1 tablet (10 mg total) by mouth daily. 09/29/19 02/04/20  Larey Dresser, MD    Past Medical History:  Diagnosis Date   Cervical disc disease    Chronic kidney disease     COLONIC POLYPS, HX OF 06/27/2007   EXOGENOUS OBESITY 01/30/2010   GERD (gastroesophageal reflux disease)    PMH   GOUT 01/30/2010   HYPERCHOLESTEROLEMIA 06/30/2007   HYPERTENSION 06/27/2007   LOW BACK PAIN 06/27/2007   NEPHROLITHIASIS, HX OF 06/27/2007   OSTEOARTHRITIS 06/27/2007   SLEEP APNEA, OBSTRUCTIVE, MODERATE 01/27/2009   Wears dentures    upper    Past Surgical History:  Procedure Laterality Date   AV FISTULA PLACEMENT Right 01/19/2019   Procedure: RIGHT BRACHIOCEPHALIC ARTERIOVENOUS (AV) FISTULA CREATION;  Surgeon: Angelia Mould,  MD;  Location: [facility] OR;  Service: Vascular;  Laterality: Right;   CARDIAC CATHETERIZATION     CARPAL TUNNEL RELEASE     left hand   COLONOSCOPY     with polypectomy   CORONARY ARTERY BYPASS GRAFT N/A 04/16/2019   Procedure: CORONARY ARTERY BYPASS GRAFTING (CABG)X3  , WITH ENDOSCOPIC HARVESTING OF RIGHT GREATER SAPHENOUS VEIN;  Surgeon: Lajuana Matte, MD;  Location: [place];  Service: Open Heart Surgery;  Laterality: N/A;   CORONARY/GRAFT ACUTE MI REVASCULARIZATION N/A 04/16/2019   Procedure: CORONARY/GRAFT ACUTE MI REVASCULARIZATION;  Surgeon: Burnell Blanks, MD;  Location: [place] [facility];  Service: Cardiovascular;  Laterality: N/A;   INSERTION OF DIALYSIS CATHETER N/A 04/29/2019   Procedure: INSERTION OF TUNNELED DIALYSIS CATHETER, right internal jugular;  Surgeon: Rosetta Posner, MD;  Location: [place];  Service: Vascular;  Laterality: N/A;   IR US GUIDE BX ASP/DRAIN  12/29/2019   JOINT REPLACEMENT Left 2005   knee   LEFT HEART CATH AND CORS/GRAFTS ANGIOGRAPHY N/A 02/05/2020   Procedure: LEFT HEART CATH AND CORS/GRAFTS ANGIOGRAPHY;  Surgeon: Larey Dresser, MD;  Location: [place] [facility];  Service: Cardiovascular;  Laterality: N/A;   LUMBAR LAMINECTOMY     MULTIPLE TOOTH EXTRACTIONS     PERIPHERAL VASCULAR BALLOON ANGIOPLASTY Right 06/25/2019   Procedure: PERIPHERAL VASCULAR BALLOON ANGIOPLASTY;  Surgeon:  Marty Heck, MD;  Location: [place] [facility];  Service: Cardiovascular;  Laterality: Right;   RADIOFREQUENCY ABLATION KIDNEY     RIGHT/LEFT HEART CATH AND CORONARY ANGIOGRAPHY N/A 04/16/2019   Procedure: RIGHT/LEFT HEART CATH AND CORONARY ANGIOGRAPHY;  Surgeon: Burnell Blanks, MD;  Location: [place] [facility];  Service: Cardiovascular;  Laterality: N/A;   TEE WITHOUT CARDIOVERSION N/A 04/16/2019   Procedure: TRANSESOPHAGEAL ECHOCARDIOGRAM (TEE);  Surgeon: Lajuana Matte, MD;  Location: [place];  Service: Open Heart Surgery;  Laterality: N/A;   TEE WITHOUT CARDIOVERSION N/A 10/08/2019   Procedure: TRANSESOPHAGEAL ECHOCARDIOGRAM (TEE);  Surgeon: Larey Dresser, MD;  Location: [facility] ENDOSCOPY;  Service: Cardiovascular;  Laterality: N/A;   TOTAL KNEE ARTHROPLASTY     left   VENTRICULAR ASSIST DEVICE INSERTION N/A 04/16/2019   Procedure: VENTRICULAR ASSIST DEVICE INSERTION;  Surgeon: Burnell Blanks, MD;  Location: [place] [facility];  Service: Cardiovascular;  Laterality: N/A;    Social History   Tobacco Use   Smoking status: Former Smoker    Types: Cigarettes    Quit date: 07/23/1980    Years since quitting: 39.6   Smokeless tobacco: Never Used  Substance Use Topics   Alcohol use: Yes    Alcohol/week: 1.0 standard drink    Types: 1 Cans of beer per week    Comment: rare    Family History  Problem Relation Age of Onset   Hypertension Other    Diabetes Sister    Hyperlipidemia Sister    Sleep apnea Sister     ROS Per hpi  OBJECTIVE:  Today's Vitals   03/03/20 0937  BP: 129/78  Pulse: 65  Temp: 97.9 F (36.6 C)  SpO2: 97%  Weight: 259 lb (117.5 kg)  Height: 6' (1.829 m)   Body mass index is 35.13 kg/m.  Wt Readings from Last 3 Encounters:  03/03/20 259 lb (117.5 kg)  02/16/20 (!) 267 lb (121.1 kg)  02/07/20 266 lb 4.8 oz (120.8 kg)    Physical Exam Vitals and nursing note reviewed.  Constitutional:      Appearance: He is  well-developed.  HENT:     Head: Normocephalic and atraumatic.  Eyes:     Conjunctiva/sclera: Conjunctivae normal.     Pupils: Pupils are equal, round, and reactive to light.  Pulmonary:     Effort: Pulmonary effort is normal.  Musculoskeletal:     Cervical back: Neck supple.  Skin:    General: Skin is warm and dry.  Neurological:     Mental Status: He is alert and oriented to person, place, and time.     No results found for this or any previous visit (from the past 24 hour(s)).  No results found.   ASSESSMENT and PLAN  1. Adjustment reaction with anxiety and depression Tolerating sertraline well. Increase to 50mg  once a day. Reach out in 4 weeks via mychart, consider increasing to 100mg  daily.  2. ESRD (end stage renal disease) on dialysis ([place]) 3. Generalized weakness Reached out to [facility] agency.   Other orders. - promethazine (PHENERGAN) 12.5 MG tablet; Take 1 tablet (12.5 mg total) by mouth every 8 (eight) hours as needed for nausea or vomiting.  Return in about 2 months (around 05/03/2020).    [name], MD [facility] [place], [place] [address] Ph.  [phone] Fax [phone]

## 2020-03-04 ENCOUNTER — Encounter (HOSPITAL_COMMUNITY): Payer: Self-pay

## 2020-03-04 ENCOUNTER — Emergency Department (HOSPITAL_COMMUNITY)
Admission: EM | Admit: 2020-03-04 | Discharge: 2020-03-05 | Disposition: A | Discharge status: Home / Self Care | Payer: Medicare Other | Attending: Emergency Medicine | Admitting: Emergency Medicine

## 2020-03-04 DIAGNOSIS — I5023 Acute on chronic systolic (congestive) heart failure: Secondary | ICD-10-CM | POA: Diagnosis not present

## 2020-03-04 DIAGNOSIS — R799 Abnormal finding of blood chemistry, unspecified: Secondary | ICD-10-CM | POA: Insufficient documentation

## 2020-03-04 DIAGNOSIS — R899 Unspecified abnormal finding in specimens from other organs, systems and tissues: Secondary | ICD-10-CM

## 2020-03-04 DIAGNOSIS — Z86010 Personal history of colonic polyps: Secondary | ICD-10-CM | POA: Diagnosis not present

## 2020-03-04 DIAGNOSIS — I2510 Atherosclerotic heart disease of native coronary artery without angina pectoris: Secondary | ICD-10-CM | POA: Diagnosis not present

## 2020-03-04 DIAGNOSIS — Z79899 Other long term (current) drug therapy: Secondary | ICD-10-CM | POA: Insufficient documentation

## 2020-03-04 DIAGNOSIS — R0602 Shortness of breath: Secondary | ICD-10-CM | POA: Diagnosis not present

## 2020-03-04 DIAGNOSIS — R079 Chest pain, unspecified: Secondary | ICD-10-CM | POA: Diagnosis not present

## 2020-03-04 DIAGNOSIS — I10 Essential (primary) hypertension: Secondary | ICD-10-CM | POA: Insufficient documentation

## 2020-03-04 DIAGNOSIS — Z87891 Personal history of nicotine dependence: Secondary | ICD-10-CM | POA: Insufficient documentation

## 2020-03-04 DIAGNOSIS — Z96652 Presence of left artificial knee joint: Secondary | ICD-10-CM | POA: Insufficient documentation

## 2020-03-04 NOTE — Telephone Encounter (Signed)
I spoke with Alex Williams and informed Alex Williams I had left a message yesterday with the d-dimer was elevated and a possible clot in the lungs and he needed to go to the ED. I asked Alex Williams if he had gone to his PCP or the ED. Alex Williams states he saw his PCP yesterday, she did not discuss the d-dimer with him yesterday. I told Alex Williams to go to the ED or PCP. Alex Williams states he is at dialysis. I asked to speak with his nurse, [place] and she states I should contact Alex Williams's son to take him to the ED. I spoke with Dian Situ Alex Williams's son and informed of the test results and that Alex Williams needed to go to the ED. Dian Situ states he will take care of the Alex Williams and get treatment.

## 2020-03-04 NOTE — ED Triage Notes (Signed)
Pt BIB [facility] for eval of elevated D-Dimer. Pt was at dialysis today and was called and informed that his dimer was elevated, sent here for further eval. Pt is Mon/Wed/Fri dialysis pt, rec'd full treatment today. Pt denies any acute complaints.

## 2020-03-05 ENCOUNTER — Emergency Department (HOSPITAL_COMMUNITY): Payer: Medicare Other

## 2020-03-05 LAB — BASIC METABOLIC PANEL
Anion gap: 14 (ref 5–15)
BUN: 7 mg/dL — ABNORMAL LOW (ref 8–23)
CO2: 31 mmol/L (ref 22–32)
Calcium: 10 mg/dL (ref 8.9–10.3)
Chloride: 95 mmol/L — ABNORMAL LOW (ref 98–111)
Creatinine, Ser: 4.77 mg/dL — ABNORMAL HIGH (ref 0.61–1.24)
GFR calc Af Amer: 13 mL/min — ABNORMAL LOW (ref >60)
GFR calc non Af Amer: 11 mL/min — ABNORMAL LOW (ref >60)
Glucose, Bld: 69 mg/dL — ABNORMAL LOW (ref 70–99)
Potassium: 3 mmol/L — ABNORMAL LOW (ref 3.5–5.1)
Sodium: 140 mmol/L (ref 135–145)

## 2020-03-05 LAB — CBC
HCT: 36.7 % — ABNORMAL LOW (ref 39.0–52.0)
Hemoglobin: 11.1 g/dL — ABNORMAL LOW (ref 13.0–17.0)
MCH: 28.5 pg (ref 26.0–34.0)
MCHC: 30.2 g/dL (ref 30.0–36.0)
MCV: 94.1 fL (ref 80.0–100.0)
Platelets: 137 10*3/uL — ABNORMAL LOW (ref 150–400)
RBC: 3.9 MIL/uL — ABNORMAL LOW (ref 4.22–5.81)
RDW: 18.5 % — ABNORMAL HIGH (ref 11.5–15.5)
WBC: 6.6 10*3/uL (ref 4.0–10.5)
nRBC: 0 % (ref 0.0–0.2)

## 2020-03-05 LAB — TROPONIN I (HIGH SENSITIVITY)
Troponin I (High Sensitivity): 55 ng/L — ABNORMAL HIGH (ref <18)
Troponin I (High Sensitivity): 45 ng/L — ABNORMAL HIGH (ref <18)

## 2020-03-05 MED ORDER — IOHEXOL 350 MG/ML SOLN
77.0000 mL | Freq: Once | INTRAVENOUS | Status: AC | PRN
  Administered 2020-03-05: 77 mL via INTRAVENOUS

## 2020-03-05 NOTE — ED Notes (Signed)
Going to c-t soon

## 2020-03-05 NOTE — ED Provider Notes (Signed)
Medical Decision Making: Care of patient assumed from Dr. Alvino Chapel at 0700.  Agree with history, physical exam and plan.  See their note for further details.  Briefly, The pt p/w chest pain sob and leg swelling, had an elevated D dimer.   Current plan is as follows: needs CTA and second trop, possible need for dialysis because of contrast, will discuss with renal if needed.  Patient CTA is unremarkable for any acute changes, some mild increase and left-sided pleural effusion however the patient is not having any new shortness of breath or chest pain.  No CTA evidence of PE.  A thyroid nodule versus lymph node he has been told about will follow up about.  He is requesting discharge and feels safe for discharge as he has had no changes to his chronic medical status, he is just here at the advice of his podiatrist for evaluation of his pulmonary embolism risk.  He feels comfortable discharge home he is invited to come back anytime.  Commented on the possible need for ultrasound of his legs he says they have had at least 10 ultrasounds of his legs as an outpatient they have never had blood clot he does not feel there is blood clot now does not feel that he needs further work-up.  I personally reviewed and interpreted all labs/imaging.      Breck Coons, MD 03/05/20 [phone]

## 2020-03-05 NOTE — ED Notes (Signed)
Patient given discharge instructions. Questions were answered. Patient verbalized understanding of discharge instructions and care at home.  

## 2020-03-05 NOTE — ED Notes (Signed)
Lab work sent

## 2020-03-05 NOTE — ED Provider Notes (Signed)
[facility] EMERGENCY DEPARTMENT Provider Note   CSN: [id number] Arrival date & time: 03/04/20  1223     History Chief Complaint  Patient presents with  . Abnormal Lab    Alex Williams is a 69 y.o. male.  HPI Patient has end-stage renal disease on dialysis.  Has been on dialysis for almost a year.  Supposed to see transplant at [facility] at the end of the month.  He has had cellulitis of right lower leg.  Had tenderness on the calf.  Had been seen by podiatry, Dr. Milinda Pointer who ordered a D-dimer.  States it was elevated and sent in for evaluation of pulmonary embolism.  Reportedly has had some chest pain and shortness of breath at times.  Has not had much swelling on his leg recently.  He is a dialysis patient was dialyzed today.  Had been waiting around 16 hours to see me in the ER.    Past Medical History:  Diagnosis Date  . Cervical disc disease   . Chronic kidney disease   . COLONIC POLYPS, HX OF 06/27/2007  . EXOGENOUS OBESITY 01/30/2010  . GERD (gastroesophageal reflux disease)    PMH  . GOUT 01/30/2010  . HYPERCHOLESTEROLEMIA 06/30/2007  . HYPERTENSION 06/27/2007  . LOW BACK PAIN 06/27/2007  . NEPHROLITHIASIS, HX OF 06/27/2007  . OSTEOARTHRITIS 06/27/2007  . SLEEP APNEA, OBSTRUCTIVE, MODERATE 01/27/2009  . Wears dentures    upper    Patient Active Problem List   Diagnosis Date Noted  . CHF (congestive heart failure) ([place]) 02/05/2020  . Hypotension 02/05/2020  . NSTEMI (non-ST elevated myocardial infarction) ([place]) 02/05/2020  . Chest pain 02/04/2020  . Acute on chronic systolic CHF (congestive heart failure) ([place]) 02/04/2020  . Atrial fibrillation, chronic ([place]) 01/21/2020  . GERD (gastroesophageal reflux disease) 01/21/2020  . Cutaneous abscess, unspecified 12/30/2019  . Cellulitis of right lower extremity 12/28/2019  . Hypercalcemia 12/14/2019  . Primary osteoarthritis of right knee 10/29/2019  . Body mass index 40.0-44.9, adult ([place]) 10/29/2019  .  Morbid obesity ([place]) 10/29/2019  . Fluid overload, unspecified 08/18/2019  . 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 Lesion of left ulnar nerve 06/26/2019  . Moderate protein-calorie malnutrition ([place]) 05/09/2019  . Hypokalemia 05/09/2019  . Recurrent knee pain 05/01/2019  . 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 Debility 04/30/2019  . ESRD (end stage renal disease) on dialysis ([place])   . Hemodialysis-associated hypotension   . Anemia due to end stage renal disease ([place])   . 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 Coronary artery disease 04/16/2019  . Acute ST elevation myocardial infarction (STEMI) involving left anterior descending (LAD) coronary artery ([place])   . Acute ST elevation myocardial infarction (STEMI) of anterior wall ([place])   . Pain, unspecified 04/09/2019  . Male erectile dysfunction, unspecified 04/09/2019  . Iron deficiency anemia, unspecified 04/09/2019  . Insomnia, unspecified 04/09/2019  . Encounter for immunization 04/09/2019  . Coagulation defect, unspecified ([place]) 04/09/2019  . Acute on chronic renal failure ([place]) 08/26/2018  . Nonspecific abnormal electrocardiogram (ECG) (EKG) 08/25/2018  . Anemia in CKD (chronic kidney disease) 08/25/2018  . Elevated troponin 08/25/2018  . SOB (shortness of breath) 08/25/2018  . Morbid obesity with BMI of 45.0-49.9, adult ([employer]) 12/03/2013  . Renal mass 12/02/2013  . Lower extremity edema 03/16/2013  . GOUT 01/30/2010  . Obstructive sleep apnea 01/27/2009  . HYPERCHOLESTEROLEMIA 06/30/2007  . Essential hypertension 06/27/2007  . Osteoarthritis 06/27/2007  .  LOW BACK PAIN 06/27/2007  . History of colonic polyps 06/27/2007  . NEPHROLITHIASIS, HX OF 06/27/2007    Past Surgical History:  Procedure Laterality Date  . AV FISTULA PLACEMENT Right 01/19/2019   Procedure: RIGHT BRACHIOCEPHALIC ARTERIOVENOUS (AV) FISTULA CREATION;  Surgeon: Angelia Mould, MD;  Location: [facility];  Service:  Vascular;  Laterality: Right;  . CARDIAC CATHETERIZATION    . CARPAL TUNNEL RELEASE     left hand  . COLONOSCOPY     with polypectomy  . CORONARY ARTERY BYPASS GRAFT N/A 04/16/2019   Procedure: CORONARY ARTERY BYPASS GRAFTING (CABG)X3  , WITH ENDOSCOPIC HARVESTING OF RIGHT GREATER SAPHENOUS VEIN;  Surgeon: Lajuana Matte, MD;  Location: [facility];  Service: Open Heart Surgery;  Laterality: N/A;  . CORONARY/GRAFT ACUTE MI REVASCULARIZATION N/A 04/16/2019   Procedure: CORONARY/GRAFT ACUTE MI REVASCULARIZATION;  Surgeon: Burnell Blanks, MD;  Location: [facility];  Service: Cardiovascular;  Laterality: N/A;  . INSERTION OF DIALYSIS CATHETER N/A 04/29/2019   Procedure: INSERTION OF TUNNELED DIALYSIS CATHETER, right internal jugular;  Surgeon: Rosetta Posner, MD;  Location: [facility];  Service: Vascular;  Laterality: N/A;  . IR US GUIDE BX ASP/DRAIN  12/29/2019  . JOINT REPLACEMENT Left 2005   knee  . LEFT HEART CATH AND CORS/GRAFTS ANGIOGRAPHY N/A 02/05/2020   Procedure: LEFT HEART CATH AND CORS/GRAFTS ANGIOGRAPHY;  Surgeon: Larey Dresser, MD;  Location: [facility];  Service: Cardiovascular;  Laterality: N/A;  . LUMBAR LAMINECTOMY    . MULTIPLE TOOTH EXTRACTIONS    . PERIPHERAL VASCULAR BALLOON ANGIOPLASTY Right 06/25/2019   Procedure: PERIPHERAL VASCULAR BALLOON ANGIOPLASTY;  Surgeon: Marty Heck, MD;  Location: [facility];  Service: Cardiovascular;  Laterality: Right;  . RADIOFREQUENCY ABLATION KIDNEY    . RIGHT/LEFT HEART CATH AND CORONARY ANGIOGRAPHY N/A 04/16/2019   Procedure: RIGHT/LEFT HEART CATH AND CORONARY ANGIOGRAPHY;  Surgeon: Burnell Blanks, MD;  Location: [facility];  Service: Cardiovascular;  Laterality: N/A;  . TEE WITHOUT CARDIOVERSION N/A 04/16/2019   Procedure: TRANSESOPHAGEAL ECHOCARDIOGRAM (TEE);  Surgeon: Lajuana Matte, MD;  Location: [facility];  Service: Open Heart Surgery;  Laterality: N/A;  . TEE WITHOUT CARDIOVERSION  N/A 10/08/2019   Procedure: TRANSESOPHAGEAL ECHOCARDIOGRAM (TEE);  Surgeon: Larey Dresser, MD;  Location: [facility] ENDOSCOPY;  Service: Cardiovascular;  Laterality: N/A;  . TOTAL KNEE ARTHROPLASTY     left  . VENTRICULAR ASSIST DEVICE INSERTION N/A 04/16/2019   Procedure: VENTRICULAR ASSIST DEVICE INSERTION;  Surgeon: Burnell Blanks, MD;  Location: [facility];  Service: Cardiovascular;  Laterality: N/A;       Family History  Problem Relation Age of Onset  . Hypertension Other   . Diabetes Sister   . Hyperlipidemia Sister   . Sleep apnea Sister     Social History   Tobacco Use  . Smoking status: Former Smoker    Types: Cigarettes    Quit date: 07/23/1980    Years since quitting: 39.6  . Smokeless tobacco: Never Used  Vaping Use  . Vaping Use: Never used  Substance Use Topics  . Alcohol use: Yes    Alcohol/week: 1.0 standard drink    Types: 1 Cans of beer per week    Comment: rare  . Drug use: Never    Home Medications Prior to Admission medications   Medication Sig Start Date End Date Taking? Authorizing Provider  acetaminophen (TYLENOL) 500 MG tablet Take 1,000 mg by mouth daily as needed  for mild pain.   Yes [provider]  allopurinol (ZYLOPRIM) 100 MG tablet Take 1 tablet (100 mg total) by mouth daily. 05/08/19  Yes Angiulli, Lavon Paganini, PA-C  amiodarone (PACERONE) 200 MG tablet TAKE 1 TABLET BY MOUTH EVERY DAY Patient taking differently: Take 200 mg by mouth daily.  01/26/20  Yes Larey Dresser, MD  bisoprolol (ZEBETA) 5 MG tablet Take 1 tablet as directed on non dialysis tues,thurs,sat,sun Patient taking differently: Take 5 mg by mouth See admin instructions. Take 1 tablet as directed on non dialysis tues,thurs,sat,sun 02/22/20  Yes Larey Dresser, MD  calcitRIOL (ROCALTROL) 0.25 MCG capsule Take 1 capsule (0.25 mcg total) by mouth every Monday, Wednesday, and Friday with hemodialysis. 12/30/19  Yes Seawell, Jaimie A, DO  cetirizine (ZYRTEC) 10 MG  tablet Take 10 mg by mouth daily as needed for allergies.   Yes [provider]  cinacalcet (SENSIPAR) 90 MG tablet Take 90 mg by mouth daily.   Yes [provider]  diphenhydramine-acetaminophen (TYLENOL PM) 25-500 MG TABS tablet Take 1 tablet by mouth at bedtime as needed (sleep).    Yes [provider]  ELIQUIS 5 MG TABS tablet TAKE 1 TABLET BY MOUTH TWICE A DAY Patient taking differently: Take 5 mg by mouth daily.  10/21/19  Yes Larey Dresser, MD  ferric citrate (AURYXIA) 1 GM 210 MG(Fe) tablet Take 210 mg by mouth 3 (three) times daily with meals.    Yes [provider]  fluticasone (FLONASE) 50 MCG/ACT nasal spray Place 1 spray into both nostrils daily as needed for allergies or rhinitis.   Yes [provider]  gabapentin (NEURONTIN) 100 MG capsule TAKE 1 CAPSULE AT BEDTIME ON HD DAYS (M,W,F) Patient taking differently: Take 100 mg by mouth every Monday, Wednesday, and Friday. Take on dialysis days 12/17/19  Yes [name], MD  midodrine (PROAMATINE) 10 MG tablet Take 1 tablet (10 mg total) by mouth every Monday, Wednesday, and Friday with hemodialysis. 05/08/19  Yes Angiulli, Lavon Paganini, PA-C  Multiple Vitamins-Minerals (CENTRUM SILVER 50+MEN) TABS Take 1 tablet by mouth daily.   Yes [provider]  multivitamin (RENA-VIT) TABS tablet Take 1 tablet by mouth at bedtime. 05/07/19  Yes Angiulli, Lavon Paganini, PA-C  nitroGLYCERIN (NITROSTAT) 0.4 MG SL tablet Place 1 tablet (0.4 mg total) under the tongue every 5 (five) minutes as needed for chest pain. 02/07/20  Yes Sheikh, Omair Latif, DO  ondansetron (ZOFRAN-ODT) 8 MG disintegrating tablet Take 1 tablet (8 mg total) by mouth every 8 (eight) hours as needed for nausea or vomiting. 10/20/19  Yes [name], MD  promethazine (PHENERGAN) 12.5 MG tablet Take 1 tablet (12.5 mg total) by mouth every 8 (eight) hours as needed for nausea or vomiting. 03/03/20  Yes [name], MD   rosuvastatin (CRESTOR) 10 MG tablet Take 1 tablet (10 mg total) by mouth daily. 09/29/19 03/05/20 Yes Larey Dresser, MD  sertraline (ZOLOFT) 50 MG tablet Take 1 tablet (50 mg total) by mouth daily. 02/16/20  Yes [name], MD  triamcinolone cream (KENALOG) 0.1 % Apply 1 application topically 2 (two) times daily as needed. 02/16/20  Yes [name], MD  Methoxy PEG-Epoetin Beta (MIRCERA IJ) Mircera 02/24/20 02/22/21  [provider]  pantoprazole (PROTONIX) 40 MG tablet Take 1 tablet (40 mg total) by mouth daily as needed (acid reflux). Patient not taking: Reported on 03/05/2020 01/21/20   Posey Boyer, MD    Allergies  Codeine phosphate  Review of Systems   Review of Systems  Constitutional: Negative for appetite change.  HENT: Negative for congestion.   Respiratory: Positive for shortness of breath.   Cardiovascular: Negative for chest pain.       Right calf pain  Gastrointestinal: Negative for abdominal pain.  Musculoskeletal: Negative for back pain.  Skin: Negative for rash.  Neurological: Negative for weakness.    Physical Exam Updated Vital Signs BP 130/71   Pulse 65   Temp 98.5 F (36.9 C) (Oral)   Resp 18   Ht 6' (1.829 m)   Wt 116.8 kg   SpO2 100%   BMI 34.92 kg/m   Physical Exam Vitals reviewed.  HENT:     Head: Normocephalic.  Eyes:     Pupils: Pupils are equal, round, and reactive to light.  Cardiovascular:     Rate and Rhythm: Regular rhythm.  Pulmonary:     Breath sounds: No wheezing or rhonchi.  Abdominal:     Tenderness: There is no abdominal tenderness.  Musculoskeletal:     Cervical back: Neck supple.     Comments: Mild tenderness of right calf  Skin:    General: Skin is warm.     Capillary Refill: Capillary refill takes less than 2 seconds.  Neurological:     Mental Status: He is alert and oriented to person, place, and time.     ED Results / Procedures / Treatments   Labs (all labs ordered are listed, but only  abnormal results are displayed) Labs Reviewed  BASIC METABOLIC PANEL  CBC  TROPONIN I (HIGH SENSITIVITY)    EKG None  Radiology No results found.  Procedures Procedures (including critical care time)  Medications Ordered in ED Medications - No data to display  ED Course  I have reviewed the triage vital signs and the nursing notes.  Pertinent labs & imaging results that were available during my care of the patient were reviewed by me and considered in my medical decision making (see chart for details).    MDM Rules/Calculators/A&P                          Patient presents with shortness of breath at times. Sent in because his podiatrist ordered a D-dimer due to right lower leg pain.Has had negative Dopplers a month and a half ago. He is on dialysis and was dialyzed on Friday. However Dr. was concerned of chronic pulmonary embolisms. Will get CT scan to evaluate. Care turned over to Dr. Ron Parker. Final Clinical Impression(s) / ED Diagnoses Final diagnoses:  None    Rx / DC Orders ED Discharge Orders    None       Davonna Belling, MD 03/05/20 [phone]

## 2020-03-08 ENCOUNTER — Ambulatory Visit: Payer: Medicare Other | Admitting: Family Medicine

## 2020-03-12 ENCOUNTER — Other Ambulatory Visit: Payer: Self-pay | Admitting: Family Medicine

## 2020-03-12 NOTE — Telephone Encounter (Signed)
Requested Prescriptions  Pending Prescriptions Disp Refills   sertraline (ZOLOFT) 50 MG tablet [Pharmacy Med Name: SERTRALINE HCL 50 MG TABLET] 90 tablet 0    Sig: TAKE 1 TABLET BY MOUTH EVERY DAY     Psychiatry:  Antidepressants - SSRI Passed - 03/12/2020  1:32 PM      Passed - Valid encounter within last 6 months    Recent Outpatient Visits          1 week ago Adjustment reaction with anxiety and depression   [facility], Lilia Argue, MD   3 weeks ago ESRD (end stage renal disease) on dialysis [facility])   [facility], Lilia Argue, MD   1 month ago Pain of right lower extremity   [facility] at [facility], Fenton Malling, MD   2 months ago Right groin pain   [facility], Lilia Argue, MD   2 months ago Acute right hip pain   [facility], Lilia Argue, MD      Future Appointments            In 1 month [name], MD Primary Care at [facility], [facility]

## 2020-03-16 NOTE — Progress Notes (Signed)
Advanced Heart Failure Clinic Note   Referring Physician: PCP: [name], MD HF Cardiology: Dr. Aundra Dubin  HPI: 69 y.o. with history of ESRD (started on HD in 2020), gout, HTN, hyperlipidemia was admitted to [facility] 04/16/19 for chest pain and palpitations.  On arrival to the ED, he was noted to be in atypical atrial flutter in the 130s-140s with acute anterior infarction. CODE STEMI activated.   He was brought to the cath lab and noted to be hypotensive with SBP in 80s. Norepinephrine was started and Impella was placed.  LHC and RHC were done showing mean RA 15, PA 68/32 mean 45, CI 3.3.  There was 90% distal left main stenosis into the LCx, totally occluded mid LAD after diagonal, 90% mild LCx, 50% ostial RCA, 99% ostial PDA.  POBA to mid LAD but unable to restore flow.   It was suspected that the LAD was a chronic occlusion and the onset of atypical aflutter with RVR drove chest pain and ischemia (via severe distal left main stenosis).  Hs-TnI returned at 1297. Echo showed EF 25% range with apical and peri-apical severe hypokinesis.   Patient was taken to the OR for urgent CABGx 3 by Dr. Kipp Brood. Unable to graft LAD, had LIMA-D2, SVG-OM2, and SVG-PDA. He required dobutamine and NE post op and was eventually weaned off and Impella removed. Afib/flutter treated w/ amiodarone and converted to NSR. Was placed on Eliquis for anticoagulation. Required CVVH for volume removal. Later transitioned to HD. Hypotension limited aggressive HF therapy. Was placed on midodrine to support BP during HD. Once stable, he was transferred to SNF for rehab. He is now back at home.   Echo 09/2019, EF 35-40% with moderate-severe eccentric MR and mild AS.   TEE 10/08/19, EF 35%, no mitral stenosis, AV mild AS with mean gradient 18   Admitted on 12/28/19 with RLE cellulitis. Placed on antibiotics. Had lower dopplers negative for DVT. Discharged on doxycycline.   Today he returns for HF follow up.Overall feeling ok  today. N/V and fatigue on dialysis days. Denies SOB/PND/Orthopnea. No bleeding issues. Appetite ok. No fever or chills. Weight at home 250-251 pounds. Taking all medications.   Labs (11/20): LDL 60, HDL 31  PMH: 1. ESRD: MWF HD. 2. Gout 3. HTN 4. Hyperlipidemia 5. Anemia of renal disease.  6. Atypical atrial flutter: Noted 9/20.  7. Atrial fibrillation: Paroxysmal.  Noted after CABG in 9/20.  8. Chronic systolic CHF: Ischemic cardiomyopathy.   - Echo (9/20): EF 30%, akinesis of the distal 1/2-2/3 of LV.  - Echo (3/21): EF 35-40%, peri-apical akinesis, moderate-severe eccentric MR, mild AS with mean gradient 18 mmHg.  9. CAD: LHC in 9/20 showed 90% dLM, totally occluded mLAD (probably CTO), 90% mid LCx, 99% PDA.  Attempted POBA LAD but unable to restore flow.  - CABG (9/20): SVG-PDA, SVG-OM2, LIMA-D2.  No good LAD target.  10. Aortic stenosis: Mild on 3/21 echo.  11. Mitral regurgitation: Moderate-severe on 3/21 echo.   Review of Systems: All systems reviewed and negative except as per HPI.   Current Outpatient Medications  Medication Sig Dispense Refill   acetaminophen (TYLENOL) 500 MG tablet Take 1,000 mg by mouth daily as needed for mild pain.     allopurinol (ZYLOPRIM) 100 MG tablet Take 1 tablet (100 mg total) by mouth daily. 30 tablet 0   amiodarone (PACERONE) 200 MG tablet TAKE 1 TABLET BY MOUTH EVERY DAY (Patient taking differently: Take 200 mg by mouth daily. ) 90 tablet 3  bisoprolol (ZEBETA) 5 MG tablet Take 1 tablet as directed on non dialysis tues,thurs,sat,sun (Patient taking differently: Take 5 mg by mouth See admin instructions. Take 1 tablet as directed on non dialysis tues,thurs,sat,sun) 90 tablet 0   cetirizine (ZYRTEC) 10 MG tablet Take 10 mg by mouth daily as needed for allergies.     cinacalcet (SENSIPAR) 90 MG tablet Take 90 mg by mouth daily.     diphenhydramine-acetaminophen (TYLENOL PM) 25-500 MG TABS tablet Take 1 tablet by mouth at bedtime as needed  (sleep).      ELIQUIS 5 MG TABS tablet TAKE 1 TABLET BY MOUTH TWICE A DAY (Patient taking differently: Take 5 mg by mouth daily. ) 180 tablet 3   ferric citrate (AURYXIA) 1 GM 210 MG(Fe) tablet Take 210 mg by mouth 3 (three) times daily with meals.      fluticasone (FLONASE) 50 MCG/ACT nasal spray Place 1 spray into both nostrils daily as needed for allergies or rhinitis.     gabapentin (NEURONTIN) 100 MG capsule TAKE 1 CAPSULE AT BEDTIME ON HD DAYS (M,W,F) (Patient taking differently: Take 100 mg by mouth every Monday, Wednesday, and Friday. Take on dialysis days) 30 capsule 2   Methoxy PEG-Epoetin Beta (MIRCERA IJ) Mircera     midodrine (PROAMATINE) 10 MG tablet Take 1 tablet (10 mg total) by mouth every Monday, Wednesday, and Friday with hemodialysis. 30 tablet 0   Multiple Vitamins-Minerals (CENTRUM SILVER 50+MEN) TABS Take 1 tablet by mouth daily.     multivitamin (RENA-VIT) TABS tablet Take 1 tablet by mouth at bedtime. 30 tablet 0   nitroGLYCERIN (NITROSTAT) 0.4 MG SL tablet Place 1 tablet (0.4 mg total) under the tongue every 5 (five) minutes as needed for chest pain. 30 tablet 0   pantoprazole (PROTONIX) 40 MG tablet Take 1 tablet (40 mg total) by mouth daily as needed (acid reflux). 30 tablet 3   promethazine (PHENERGAN) 12.5 MG tablet Take 1 tablet (12.5 mg total) by mouth every 8 (eight) hours as needed for nausea or vomiting. 20 tablet 5   sertraline (ZOLOFT) 50 MG tablet TAKE 1 TABLET BY MOUTH EVERY DAY 90 tablet 0   triamcinolone cream (KENALOG) 0.1 % Apply 1 application topically 2 (two) times daily as needed. 45 g 0   calcitRIOL (ROCALTROL) 0.25 MCG capsule Take 1 capsule (0.25 mcg total) by mouth every Monday, Wednesday, and Friday with hemodialysis. (Patient not taking: Reported on 03/17/2020) 30 capsule 0   ondansetron (ZOFRAN-ODT) 8 MG disintegrating tablet Take 1 tablet (8 mg total) by mouth every 8 (eight) hours as needed for nausea or vomiting. (Patient not  taking: Reported on 03/17/2020) 20 tablet 5   rosuvastatin (CRESTOR) 10 MG tablet Take 1 tablet (10 mg total) by mouth daily. 90 tablet 3   No current facility-administered medications for this encounter.    Allergies  Allergen Reactions   Codeine Phosphate Other (See Comments)    Hyperactive       Social History   Socioeconomic History   Marital status: Legally Separated    Spouse name: Not on file   Number of children: 2   Years of education: Not on file   Highest education level: Not on file  Occupational History   Not on file  Tobacco Use   Smoking status: Former Smoker    Types: Cigarettes    Quit date: 07/23/1980    Years since quitting: 39.6   Smokeless tobacco: Never Used  Vaping Use   Vaping Use: Never used  Substance and Sexual Activity   Alcohol use: Yes    Alcohol/week: 1.0 standard drink    Types: 1 Cans of beer per week    Comment: rare   Drug use: Never   Sexual activity: Not Currently  Other Topics Concern   Not on file  Social History Narrative   Pt lives alone. He has one son who lives within 3 miles who he usually sees daily. He has two sisters living in the next town over who he also sees on a weekly basis. He is a religious man who attends church at least 3 Sundays each month.   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    Social Determinants of Health   Financial Resource Strain:    Difficulty of Paying Living Expenses: Not on file  Food Insecurity:    Worried About Charity fundraiser in the Last Year: Not on file   [employer] of Food in the Last Year: Not on file  Transportation Needs:    Lack of Transportation (Medical): Not on file   Lack of Transportation (Non-Medical): Not on file  Physical Activity:    Days of Exercise per Week: Not on file   Minutes of Exercise per Session: Not on file  Stress:    Feeling of Stress : Not on file  Social Connections:    Frequency of Communication with Friends and Family: Not on file    Frequency of Social Gatherings with Friends and Family: Not on file   Attends Religious Services: Not on file   Active Member of Clubs or Organizations: Not on file   Attends Archivist Meetings: Not on file   Marital Status: Not on file  Intimate Partner Violence:    Fear of Current or Ex-Partner: Not on file   Emotionally Abused: Not on file   Physically Abused: Not on file   Sexually Abused: Not on file      Family History  Problem Relation Age of Onset   Hypertension Other    Diabetes Sister    Hyperlipidemia Sister    Sleep apnea Sister     Vitals:   03/17/20 0939  BP: 134/80  Pulse: 65  SpO2: 97%  Weight: 113.9 kg (251 lb)   PHYSICAL EXAM: General: Ambulated in the clinic with a walker. No resp difficulty HEENT: normal Neck: supple. no JVD. Carotids 2+ bilat; no bruits. No lymphadenopathy or thryomegaly appreciated. Cor: PMI nondisplaced. Regular rate & rhythm. No rubs, gallops or murmurs. Lungs: clear Abdomen: soft, nontender, nondistended. No hepatosplenomegaly. No bruits or masses. Good bowel sounds. Extremities: no cyanosis, clubbing, rash, edema Neuro: alert & orientedx3, cranial nerves grossly intact. moves all 4 extremities w/o difficulty. Affect pleasant  ASSESSMENT & PLAN:  1. CAD: Cardiogenic shock in setting of atrial flutter with RVR in 03/2019.  Probable CTO mid LAD with extensive disease in other arteries. Patient taken urgently for CABG supported by Impella. 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 Kipp Brood (unable to graft LAD,  LIMA-D2, SVG-OM2, SVG-PDA).   - No chest pain.   - Continue ASA 81.  - Continue  Crestor 10 mg daily 2. Chronic systolic CHF: Echo with EF in 30% range with peri-apical severe akinesis in 9/20. Ischemic cardiomyopathy. Now s/p CABG. Echo today with EF a bit higher at 35-40%.   - With EF up to 35-40%, would hold off on ICD (also given uncertain utility in dialysis patient).  - NYHA II. Volume managed with  dialysis.  -  Continue bisoprolol 5 mg on non-HD days. - Continue midodrine pre-HD.    3. Atrial fibrillation/flutter:  - Regular on exam.  - Continue amio 200 mg daily for rate control.  - On eliquis 5 mg twice a day.   4. ESRD: HD MWF. 5. Aortic stenosis: Mild on TEE 10/08/19   6. Mitral regurgitation: Moderate to severe eccentric MR.   - S/P TEE 6/9 that showed moderate MR with restriction posterior leaflet. 7. R groin pain Resolved.    Follow up with Dr Aundra Dubin 3-4 months. Plan to repeat ECHO   Darrick Grinder, NP 03/17/20

## 2020-03-17 ENCOUNTER — Other Ambulatory Visit: Payer: Self-pay

## 2020-03-17 ENCOUNTER — Encounter (HOSPITAL_COMMUNITY): Payer: Self-pay

## 2020-03-17 ENCOUNTER — Ambulatory Visit (HOSPITAL_COMMUNITY)
Admission: RE | Admit: 2020-03-17 | Discharge: 2020-03-17 | Disposition: A | Discharge status: Home / Self Care | Payer: Medicare Other | Source: Ambulatory Visit | Attending: Adult Health | Admitting: Adult Health

## 2020-03-17 ENCOUNTER — Telehealth (HOSPITAL_COMMUNITY): Payer: Self-pay | Admitting: Pharmacy Technician

## 2020-03-17 VITALS — BP 134/80 | HR 65 | Wt 251.0 lb

## 2020-03-17 DIAGNOSIS — Z79899 Other long term (current) drug therapy: Secondary | ICD-10-CM | POA: Insufficient documentation

## 2020-03-17 DIAGNOSIS — I132 Hypertensive heart and chronic kidney disease with heart failure and with stage 5 chronic kidney disease, or end stage renal disease: Secondary | ICD-10-CM | POA: Insufficient documentation

## 2020-03-17 DIAGNOSIS — I255 Ischemic cardiomyopathy: Secondary | ICD-10-CM | POA: Diagnosis not present

## 2020-03-17 DIAGNOSIS — R112 Nausea with vomiting, unspecified: Secondary | ICD-10-CM | POA: Diagnosis present

## 2020-03-17 DIAGNOSIS — I2510 Atherosclerotic heart disease of native coronary artery without angina pectoris: Secondary | ICD-10-CM | POA: Diagnosis not present

## 2020-03-17 DIAGNOSIS — I484 Atypical atrial flutter: Secondary | ICD-10-CM | POA: Insufficient documentation

## 2020-03-17 DIAGNOSIS — Z951 Presence of aortocoronary bypass graft: Secondary | ICD-10-CM | POA: Diagnosis not present

## 2020-03-17 DIAGNOSIS — N186 End stage renal disease: Secondary | ICD-10-CM | POA: Insufficient documentation

## 2020-03-17 DIAGNOSIS — E785 Hyperlipidemia, unspecified: Secondary | ICD-10-CM | POA: Diagnosis not present

## 2020-03-17 DIAGNOSIS — Z8249 Family history of ischemic heart disease and other diseases of the circulatory system: Secondary | ICD-10-CM | POA: Insufficient documentation

## 2020-03-17 DIAGNOSIS — I480 Paroxysmal atrial fibrillation: Secondary | ICD-10-CM | POA: Diagnosis not present

## 2020-03-17 DIAGNOSIS — I080 Rheumatic disorders of both mitral and aortic valves: Secondary | ICD-10-CM | POA: Diagnosis not present

## 2020-03-17 DIAGNOSIS — M109 Gout, unspecified: Secondary | ICD-10-CM | POA: Insufficient documentation

## 2020-03-17 DIAGNOSIS — Z7901 Long term (current) use of anticoagulants: Secondary | ICD-10-CM | POA: Insufficient documentation

## 2020-03-17 DIAGNOSIS — I5022 Chronic systolic (congestive) heart failure: Secondary | ICD-10-CM | POA: Diagnosis not present

## 2020-03-17 DIAGNOSIS — R5383 Other fatigue: Secondary | ICD-10-CM | POA: Insufficient documentation

## 2020-03-17 DIAGNOSIS — R1031 Right lower quadrant pain: Secondary | ICD-10-CM | POA: Diagnosis not present

## 2020-03-17 DIAGNOSIS — Z87891 Personal history of nicotine dependence: Secondary | ICD-10-CM | POA: Diagnosis not present

## 2020-03-17 DIAGNOSIS — Z992 Dependence on renal dialysis: Secondary | ICD-10-CM | POA: Insufficient documentation

## 2020-03-17 NOTE — Telephone Encounter (Signed)
Patient is in the coverage gap, Eliquis is $133.50 for a 30 day supply. Started an application with [employer] and left patient a message to return my call.  Will follow up.

## 2020-03-17 NOTE — Patient Instructions (Addendum)
It was great to see you today! No medication changes are needed at this time.  Your physician recommends that you schedule a follow-up appointment in: 3-4 months with Dr Aundra Dubin  Do the following things EVERYDAY: 1) Weigh yourself in the morning before breakfast. Write it down and keep it in a log. 2) Take your medicines as prescribed 3) Eat low salt foods--Limit salt (sodium) to 2000 mg per day.  4) Stay as active as you can everyday 5) Limit all fluids for the day to less than 2 liters  If you have any questions or concerns before your next appointment please send [place] a message through [place] or call our office at [phone].    TO LEAVE A MESSAGE FOR THE NURSE SELECT OPTION 2, PLEASE LEAVE A MESSAGE INCLUDING: . YOUR NAME . DATE OF BIRTH . CALL BACK NUMBER . REASON FOR CALL**this is important as we prioritize the call backs  YOU WILL RECEIVE A CALL BACK THE SAME DAY AS LONG AS YOU CALL BEFORE 4:00 PM

## 2020-03-18 NOTE — Telephone Encounter (Signed)
Spoke with patient. Will mail the application to his daughter, Prudencio Velazco for signature. Her address is [place], [place] [address]. Patient is aware to include the OOP expense report from his pharmacy as well.  Will follow up.

## 2020-03-29 ENCOUNTER — Other Ambulatory Visit: Payer: Self-pay

## 2020-03-29 ENCOUNTER — Ambulatory Visit: Payer: Medicare Other | Admitting: Podiatry

## 2020-03-29 DIAGNOSIS — L03119 Cellulitis of unspecified part of limb: Secondary | ICD-10-CM | POA: Diagnosis not present

## 2020-03-29 DIAGNOSIS — L02619 Cutaneous abscess of unspecified foot: Secondary | ICD-10-CM | POA: Diagnosis not present

## 2020-03-29 DIAGNOSIS — L600 Ingrowing nail: Secondary | ICD-10-CM

## 2020-03-29 MED ORDER — NEOMYCIN-POLYMYXIN-HC 1 % OT SOLN: OTIC | 0 refills | Status: DC

## 2020-03-29 NOTE — Patient Instructions (Signed)
Leave this dressing in place for 48 hours   Place 1/4 cup of epsom salts in a quart of warm tap water.  Submerge your foot or feet in the solution and soak for 20 minutes.  This soak should be done twice a day.  Next, remove your foot or feet from solution, blot dry the affected area. Apply drops and cover with bandaid if instructed by your doctor.   IF YOUR SKIN BECOMES IRRITATED WHILE USING THESE INSTRUCTIONS, IT IS OKAY TO SWITCH TO  WHITE VINEGAR AND WATER.  As another alternative soak, you may use antibacterial soap and water.  Monitor for any signs/symptoms of infection. Call the office immediately if any occur or go directly to the emergency room. Call with any questions/concerns.

## 2020-03-30 NOTE — Progress Notes (Signed)
  Subjective:  Patient ID: Alex Williams, male    DOB: 07/15/1951,  MRN: [id number]  Chief Complaint  Patient presents with  . Ingrown Toenail    AP nail procedure right    69 y.o. male presents with the above complaint. History confirmed with patient.  Has had a recurrent ingrown toenail on the right hallux multiple times.  He has had this treated before with matricectomy and some of the toenail has regrown.  Here with his daughter today.  He was up to have this done at the last visit, however that time he had significant pain and swelling in the calf.  Dr. Milinda Pointer sent him for evaluation of the emergency room for an ultrasound which showed knee no DVT but resolving cellulitis.  Objective:  Physical Exam: warm, good capillary refill, no trophic changes or ulcerative lesions, normal DP and PT pulses and normal sensory exam.  Right Foot: Ingrown right great toenail without paronychia medial border.  No cellulitis, signs of DVT or calf pain today.  Assessment:  No diagnosis found.   Plan:  Patient was evaluated and treated and all questions answered.  -Calf pain and swelling is resolved.    Ingrown Nail, right -Patient elects to proceed with minor surgery to remove ingrown toenail today. Consent reviewed and signed by patient. -Ingrown nail excised. See procedure note. -Educated on post-procedure care including soaking. Written instructions provided and reviewed. -Patient to follow up in 2 weeks for nail check.  Procedure: Excision of Ingrown Toenail Location: Right total nail avulsion. Anesthesia: Lidocaine 1% plain; 1.5 mL and Marcaine 0.5% plain; 1.5 mL, digital block. Skin Prep: Betadine. Dressing: Silvadene; telfa; dry, sterile, compression dressing. Technique: Following skin prep, the toe was exsanguinated and a tourniquet was secured at the base of the toe. The right hallux nail was freed, and excised. Chemical matrixectomy was then performed with phenol and irrigated out  with alcohol. The tourniquet was then removed and sterile dressing applied. Disposition: Patient tolerated procedure well. Patient to return in 2 weeks for follow-up.      Return in about 2 weeks (around 04/12/2020) for nail re-check.

## 2020-04-06 ENCOUNTER — Other Ambulatory Visit: Payer: Self-pay

## 2020-04-06 ENCOUNTER — Telehealth (INDEPENDENT_AMBULATORY_CARE_PROVIDER_SITE_OTHER): Payer: Medicare Other | Admitting: Registered Nurse

## 2020-04-06 DIAGNOSIS — R112 Nausea with vomiting, unspecified: Secondary | ICD-10-CM

## 2020-04-06 MED ORDER — ONDANSETRON HCL 4 MG PO TABS: 4.0000 mg | ORAL_TABLET | Freq: Two times a day (BID) | ORAL | 0 refills | Status: DC | PRN

## 2020-04-06 NOTE — Patient Instructions (Signed)
° ° ° °  If you have lab work done today you will be contacted with your lab results within the next 2 weeks.  If you have not heard from us then please contact us. The fastest way to get your results is to register for My Chart. ° ° °IF you received an x-ray today, you will receive an invoice from [facility]. Please contact  Radiology at [phone] with questions or concerns regarding your invoice.  ° °IF you received labwork today, you will receive an invoice from [facility]. Please contact [facility] at [phone] with questions or concerns regarding your invoice.  ° °Our billing staff will not be able to assist you with questions regarding bills from these companies. ° °You will be contacted with the lab results as soon as they are available. The fastest way to get your results is to activate your My Chart account. Instructions are located on the last page of this paperwork. If you have not heard from us regarding the results in 2 weeks, please contact this office. °  ° ° ° °

## 2020-04-06 NOTE — Progress Notes (Signed)
Telemedicine Encounter- SOAP NOTE Established Patient  This telephone encounter was conducted with the patient's (or proxy's) verbal consent via audio telecommunications: yes  Patient was instructed to have this encounter in a suitably private space; and to only have persons present to whom they give permission to participate. In addition, patient identity was confirmed by use of name plus two identifiers (DOB and address).  I discussed the limitations, risks, security and privacy concerns of performing an evaluation and management service by telephone and the availability of in person appointments. I also discussed with the patient that there may be a patient responsible charge related to this service. The patient expressed understanding and agreed to proceed.  I spent a total of 16 minutes talking with the patient or their proxy.  Patient at home Provider in office   Chief Complaint  Patient presents with  . Diarrhea    patient states since monday he has been experiencing some chills , diarrhea, and vomiting . he has not been able to keep any foods or liquids down for 3 days.    Subjective   Alex Williams is a 69 y.o. established patient. Telephone visit today for nausea with vomiting  HPI Ongoing for 2-3 days. No apparent trigger.  Does note that he has not been able to keep down any food or fluids Skipped dialysis today d/t symptoms.  Monday at dialysis there was a woman in the waiting room coughing and sneezing - she was sent home before long. Otherwise no sick contacts.  Diarrhea is dark in nature - but he is getting iron supplementation with dialysis for his ESRD, he is familiar with this symptom and states that he does not feel it is a GI bleed. Is currently taking eliquis. Avoiding NSAIDs d/t ESRD. No hx of GI bleed. Does not feel lightheaded dizzy shob or otherwise show any symptoms of blood loss anemia.  Vomiting is happening every few hours or when he eats. Having some  headache that may be related. Denies coffee ground emesis.   Has been on ondansetron and phenergen in the past for nausea. Not taking either at this time.  No other symptoms concerns or complaints.   Patient Active Problem List   Diagnosis Date Noted  . CHF (congestive heart failure) ([place]) 02/05/2020  . Hypotension 02/05/2020  . NSTEMI (non-ST elevated myocardial infarction) ([place]) 02/05/2020  . Chest pain 02/04/2020  . Acute on chronic systolic CHF (congestive heart failure) ([place]) 02/04/2020  . Atrial fibrillation, chronic ([place]) 01/21/2020  . GERD (gastroesophageal reflux disease) 01/21/2020  . Cutaneous abscess, unspecified 12/30/2019  . Cellulitis of right lower extremity 12/28/2019  . Hypercalcemia 12/14/2019  . Primary osteoarthritis of right knee 10/29/2019  . Body mass index 40.0-44.9, adult ([place]) 10/29/2019  . Morbid obesity ([place]) 10/29/2019  . Fluid overload, unspecified 08/18/2019  . 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 Lesion of left ulnar nerve 06/26/2019  . Moderate protein-calorie malnutrition ([place]) 05/09/2019  . Hypokalemia 05/09/2019  . Recurrent knee pain 05/01/2019  . 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 Debility 04/30/2019  . ESRD (end stage renal disease) on dialysis ([place])   . Hemodialysis-associated hypotension   . Anemia due to end stage renal disease ([place])   . 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 Coronary artery disease 04/16/2019  . Acute ST elevation myocardial infarction (STEMI) involving left anterior descending (LAD) coronary artery ([place])   . Acute ST elevation myocardial  infarction (STEMI) of anterior wall ([place])   . Pain, unspecified 04/09/2019  . Male erectile dysfunction, unspecified 04/09/2019  . Iron deficiency anemia, unspecified 04/09/2019  . Insomnia, unspecified 04/09/2019  . Encounter for immunization 04/09/2019  . Coagulation defect, unspecified ([place]) 04/09/2019  . Acute on chronic renal failure  ([place]) 08/26/2018  . Nonspecific abnormal electrocardiogram (ECG) (EKG) 08/25/2018  . Anemia in CKD (chronic kidney disease) 08/25/2018  . Elevated troponin 08/25/2018  . SOB (shortness of breath) 08/25/2018  . Morbid obesity with BMI of 45.0-49.9, adult ([place]) 12/03/2013  . Renal mass 12/02/2013  . Lower extremity edema 03/16/2013  . GOUT 01/30/2010  . Obstructive sleep apnea 01/27/2009  . HYPERCHOLESTEROLEMIA 06/30/2007  . Essential hypertension 06/27/2007  . Osteoarthritis 06/27/2007  . LOW BACK PAIN 06/27/2007  . History of colonic polyps 06/27/2007  . NEPHROLITHIASIS, HX OF 06/27/2007    Past Medical History:  Diagnosis Date  . Cervical disc disease   . Chronic kidney disease   . COLONIC POLYPS, HX OF 06/27/2007  . EXOGENOUS OBESITY 01/30/2010  . GERD (gastroesophageal reflux disease)    PMH  . GOUT 01/30/2010  . HYPERCHOLESTEROLEMIA 06/30/2007  . HYPERTENSION 06/27/2007  . LOW BACK PAIN 06/27/2007  . NEPHROLITHIASIS, HX OF 06/27/2007  . OSTEOARTHRITIS 06/27/2007  . SLEEP APNEA, OBSTRUCTIVE, MODERATE 01/27/2009  . Wears dentures    upper    Current Outpatient Medications  Medication Sig Dispense Refill  . acetaminophen (TYLENOL) 500 MG tablet Take 1,000 mg by mouth daily as needed for mild pain.    Marland Kitchen allopurinol (ZYLOPRIM) 100 MG tablet Take 1 tablet (100 mg total) by mouth daily. 30 tablet 0  . amiodarone (PACERONE) 200 MG tablet TAKE 1 TABLET BY MOUTH EVERY DAY (Patient taking differently: Take 200 mg by mouth daily. ) 90 tablet 3  . bisoprolol (ZEBETA) 5 MG tablet Take 1 tablet as directed on non dialysis tues,thurs,sat,sun (Patient taking differently: Take 5 mg by mouth See admin instructions. Take 1 tablet as directed on non dialysis tues,thurs,sat,sun) 90 tablet 0  . cetirizine (ZYRTEC) 10 MG tablet Take 10 mg by mouth daily as needed for allergies.    . cinacalcet (SENSIPAR) 90 MG tablet Take 90 mg by mouth daily.    . diphenhydramine-acetaminophen (TYLENOL PM) 25-500 MG  TABS tablet Take 1 tablet by mouth at bedtime as needed (sleep).     Marland Kitchen ELIQUIS 5 MG TABS tablet TAKE 1 TABLET BY MOUTH TWICE A DAY (Patient taking differently: Take 5 mg by mouth daily. ) 180 tablet 3  . ferric citrate (AURYXIA) 1 GM 210 MG(Fe) tablet Take 210 mg by mouth 3 (three) times daily with meals.     . fluticasone (FLONASE) 50 MCG/ACT nasal spray Place 1 spray into both nostrils daily as needed for allergies or rhinitis.    Marland Kitchen gabapentin (NEURONTIN) 100 MG capsule TAKE 1 CAPSULE AT BEDTIME ON HD DAYS (M,W,F) (Patient taking differently: Take 100 mg by mouth every Monday, Wednesday, and Friday. Take on dialysis days) 30 capsule 2  . Methoxy PEG-Epoetin Beta (MIRCERA IJ) Mircera    . midodrine (PROAMATINE) 10 MG tablet Take 1 tablet (10 mg total) by mouth every Monday, Wednesday, and Friday with hemodialysis. 30 tablet 0  . Multiple Vitamins-Minerals (CENTRUM SILVER 50+MEN) TABS Take 1 tablet by mouth daily.    . multivitamin (RENA-VIT) TABS tablet Take 1 tablet by mouth at bedtime. 30 tablet 0  . NEOMYCIN-POLYMYXIN-HYDROCORTISONE (CORTISPORIN) 1 % SOLN OTIC solution Apply to toenail avulsion site daily after  soaking, cover with bandaid 10 mL 0  . nitroGLYCERIN (NITROSTAT) 0.4 MG SL tablet Place 1 tablet (0.4 mg total) under the tongue every 5 (five) minutes as needed for chest pain. 30 tablet 0  . pantoprazole (PROTONIX) 40 MG tablet Take 1 tablet (40 mg total) by mouth daily as needed (acid reflux). 30 tablet 3  . promethazine (PHENERGAN) 12.5 MG tablet Take 1 tablet (12.5 mg total) by mouth every 8 (eight) hours as needed for nausea or vomiting. 20 tablet 5  . sertraline (ZOLOFT) 50 MG tablet TAKE 1 TABLET BY MOUTH EVERY DAY 90 tablet 0  . triamcinolone cream (KENALOG) 0.1 % Apply 1 application topically 2 (two) times daily as needed. 45 g 0  . calcitRIOL (ROCALTROL) 0.25 MCG capsule Take 1 capsule (0.25 mcg total) by mouth every Monday, Wednesday, and Friday with hemodialysis. (Patient not  taking: Reported on 03/17/2020) 30 capsule 0  . ondansetron (ZOFRAN) 4 MG tablet Take 1 tablet (4 mg total) by mouth 2 (two) times daily as needed for nausea or vomiting. 20 tablet 0  . ondansetron (ZOFRAN-ODT) 8 MG disintegrating tablet Take 1 tablet (8 mg total) by mouth every 8 (eight) hours as needed for nausea or vomiting. (Patient not taking: Reported on 03/17/2020) 20 tablet 5  . rosuvastatin (CRESTOR) 10 MG tablet Take 1 tablet (10 mg total) by mouth daily. 90 tablet 3   No current facility-administered medications for this visit.    Allergies  Allergen Reactions  . Codeine Phosphate Other (See Comments)    Hyperactive     Social History   Socioeconomic History  . Marital status: Legally Separated    Spouse name: Not on file  . Number of children: 2  . Years of education: Not on file  . Highest education level: Not on file  Occupational History  . Not on file  Tobacco Use  . Smoking status: Former Smoker    Types: Cigarettes    Quit date: 07/23/1980    Years since quitting: 39.7  . Smokeless tobacco: Never Used  Vaping Use  . Vaping Use: Never used  Substance and Sexual Activity  . Alcohol use: Yes    Alcohol/week: 1.0 standard drink    Types: 1 Cans of beer per week    Comment: rare  . Drug use: Never  . Sexual activity: Not Currently  Other Topics Concern  . Not on file  Social History Narrative   Pt lives alone. He has one son who lives within 3 miles who he usually sees daily. He has two sisters living in the next town over who he also sees on a weekly basis. He is a religious man who attends church at least 3 Sundays each month.   He operates [employer] full time.    Social Determinants of Health   Financial Resource Strain:   . Difficulty of Paying Living Expenses: Not on file  Food Insecurity:   . Worried About Charity fundraiser in the Last Year: Not on file  . Ran Out of Food in the Last Year: Not on file  Transportation Needs:   . Lack of  Transportation (Medical): Not on file  . Lack of Transportation (Non-Medical): Not on file  Physical Activity:   . Days of Exercise per Week: Not on file  . Minutes of Exercise per Session: Not on file  Stress:   . Feeling of Stress : Not on file  Social Connections:   . Frequency of Communication with  Friends and Family: Not on file  . Frequency of Social Gatherings with Friends and Family: Not on file  . Attends Religious Services: Not on file  . Active Member of Clubs or Organizations: Not on file  . Attends Archivist Meetings: Not on file  . Marital Status: Not on file  Intimate Partner Violence:   . Fear of Current or Ex-Partner: Not on file  . Emotionally Abused: Not on file  . Physically Abused: Not on file  . Sexually Abused: Not on file    ROS Pertinent negatives and positives per HPI  Objective   Vitals as reported by the patient: There were no vitals filed for this visit.  Alex Williams was seen today for diarrhea.  Diagnoses and all orders for this visit:  Nausea and vomiting, intractability of vomiting not specified, unspecified vomiting type -     ondansetron (ZOFRAN) 4 MG tablet; Take 1 tablet (4 mg total) by mouth 2 (two) times daily as needed for nausea or vomiting.   PLAN  While his symptoms are overall reassuring regarding a GI bleed vs stools darkened by iron supplementation, it cannot be ruled out. I will have him come into the office on Friday for in person follow up  zofran given in the mean time - 4mg  PO bid PRN. I want him to get some fluids and food in - we cannot delay dialysis much longer given his renal state  ER precautions strict and reviewed thoroughly with patient who demonstrates understanding  Patient encouraged to call clinic with any questions, comments, or concerns.  I discussed the assessment and treatment plan with the patient. The patient was provided an opportunity to ask questions and all were answered. The patient agreed  with the plan and demonstrated an understanding of the instructions.   The patient was advised to call back or seek an in-person evaluation if the symptoms worsen or if the condition fails to improve as anticipated.  I provided 16 minutes of non-face-to-face time during this encounter.  Maximiano Coss, NP  Primary Care at [facility]

## 2020-04-12 ENCOUNTER — Ambulatory Visit: Payer: Medicare Other | Admitting: Podiatry

## 2020-04-12 ENCOUNTER — Other Ambulatory Visit: Payer: Self-pay

## 2020-04-12 DIAGNOSIS — L02619 Cutaneous abscess of unspecified foot: Secondary | ICD-10-CM

## 2020-04-12 DIAGNOSIS — L600 Ingrowing nail: Secondary | ICD-10-CM

## 2020-04-12 DIAGNOSIS — L03119 Cellulitis of unspecified part of limb: Secondary | ICD-10-CM

## 2020-04-12 NOTE — Progress Notes (Signed)
  Subjective:  Patient ID: Alex Williams, male    DOB: [birth year],  MRN: [id number]  Chief Complaint  Patient presents with  . Nail Problem    Great toenail right foot, small drainage, no pain, no foul odor.   . Follow-up    69 y.o. male returns with the above complaint. History confirmed with patient. Has been healing well, not having pain.  Objective:  Physical Exam: warm, good capillary refill, no trophic changes or ulcerative lesions, normal DP and PT pulses and normal sensory exam.  Right Foot: Nail avulsion and matrixectomy site is healing well w/o signs of infection  Assessment:   1. Ingrowing right great toenail   2. Cellulitis and abscess of foot, except toes      Plan:  Patient was evaluated and treated and all questions answered.  Ingrown Nail, right -healing well, can leave OTA.  Return if symptoms worsen or fail to improve.

## 2020-04-21 ENCOUNTER — Other Ambulatory Visit: Payer: Self-pay | Admitting: Family Medicine

## 2020-04-21 NOTE — Telephone Encounter (Signed)
Requested Prescriptions  Pending Prescriptions Disp Refills   pantoprazole (PROTONIX) 40 MG tablet [Pharmacy Med Name: PANTOPRAZOLE SOD DR 40 MG TAB] 90 tablet 1    Sig: TAKE 1 TABLET (40 MG TOTAL) BY MOUTH DAILY AS NEEDED (ACID REFLUX).     Gastroenterology: Proton Pump Inhibitors Passed - 04/21/2020  1:23 AM      Passed - Valid encounter within last 12 months    Recent Outpatient Visits          2 weeks ago Nausea and vomiting, intractability of vomiting not specified, unspecified vomiting type   [facility] [place], NP   1 month ago Adjustment reaction with anxiety and depression   [facility] at [facility], Lilia Argue, MD   2 months ago ESRD (end stage renal disease) on dialysis [facility])   [facility] at [facility], Lilia Argue, MD   3 months ago Pain of right lower extremity   [facility] at [facility], Fenton Malling, MD   3 months ago Right groin pain   [facility], Lilia Argue, MD      Future Appointments            In 5 days [name], MD Primary [facility] at [place], [facility]

## 2020-04-26 ENCOUNTER — Encounter: Payer: Self-pay | Admitting: Family Medicine

## 2020-04-26 ENCOUNTER — Ambulatory Visit (INDEPENDENT_AMBULATORY_CARE_PROVIDER_SITE_OTHER): Payer: Medicare Other | Admitting: Family Medicine

## 2020-04-26 ENCOUNTER — Other Ambulatory Visit: Payer: Self-pay

## 2020-04-26 VITALS — BP 81/51 | HR 61 | Temp 98.2°F | Ht 72.0 in | Wt 236.0 lb

## 2020-04-26 DIAGNOSIS — I953 Hypotension of hemodialysis: Secondary | ICD-10-CM | POA: Diagnosis not present

## 2020-04-26 DIAGNOSIS — M549 Dorsalgia, unspecified: Secondary | ICD-10-CM

## 2020-04-26 DIAGNOSIS — N186 End stage renal disease: Secondary | ICD-10-CM

## 2020-04-26 DIAGNOSIS — F4323 Adjustment disorder with mixed anxiety and depressed mood: Secondary | ICD-10-CM | POA: Insufficient documentation

## 2020-04-26 DIAGNOSIS — Z992 Dependence on renal dialysis: Secondary | ICD-10-CM

## 2020-04-26 DIAGNOSIS — R110 Nausea: Secondary | ICD-10-CM | POA: Diagnosis not present

## 2020-04-26 MED ORDER — SERTRALINE HCL 100 MG PO TABS: 100.0000 mg | ORAL_TABLET | Freq: Every day | ORAL | 3 refills | Status: DC

## 2020-04-26 NOTE — Telephone Encounter (Signed)
Spoke with the patient. He mailed the application last Tuesday or Wednesday. I told him I would keep an eye out for the application and follow up.

## 2020-04-26 NOTE — Progress Notes (Signed)
10/5/202111:10 AM  Tremond A Aaberg November 06, 1950, 69 y.o., male [id number]  Chief Complaint  Patient presents with  . Medication Problem    still feels the anxiety and depression meds are not working. Completing questionaires  . Pain    pain in between shoulder blades hurt all the time    HPI:   Patient is a 69 y.o. male with past medical history significant for HTN, HLP, gout,ESRDon HD, MI s/p 3V CABG, knee OA who presents today for routine followup  Last OV aug 2021 - increased sertraline to 50mg  daily  Overall doing ok   Saw renal transplant in [place] on aug 31 - needs stress, echo and colonoscopy (they will order), recommend PT  phenegran before dialysis is working really well for him  Feels depression and anxiety are still not well controlled phq9 and gad7 noted  Having intermittent sharp pain along left shoulder blade boarder Uses cane on right hand  GAD 7 : Generalized Anxiety Score 04/26/2020 03/03/2020 01/21/2020  Nervous, Anxious, on Edge 2 3 0  Control/stop worrying 1 3 0  Worry too much - different things 2 3 0  Trouble relaxing 3 3 0  Restless 1 3 0  Easily annoyed or irritable 0 2 0  Afraid - awful might happen 2 0 0  Total GAD 7 Score 11 17 0  Anxiety Difficulty Somewhat difficult Very difficult Not difficult at all    Depression screen [facility] 2/9 04/26/2020 04/06/2020 03/03/2020  Decreased Interest 3 0 3  Down, Depressed, Hopeless 2 0 2  PHQ - 2 Score 5 0 5  Altered sleeping 3 - 3  Tired, decreased energy 3 - 3  Change in appetite 1 - 3  Feeling bad or failure about yourself  0 - 0  Trouble concentrating 1 - 1  Moving slowly or fidgety/restless 1 - 3  Suicidal thoughts 0 - 0  PHQ-9 Score 14 - 18  Difficult doing work/chores - - Very difficult  Some recent data might be hidden    Fall Risk  04/06/2020 03/03/2020 02/16/2020 01/21/2020 01/01/2020  Falls in the past year? 1 0 1 0 0  Number falls in past yr: 0 0 0 0 0  Comment - - - - -  Injury with Fall? 0 0 1 0  0  Comment - - - - -  Risk for fall due to : - - - - -  Follow up Falls evaluation completed Falls evaluation completed Falls evaluation completed Falls evaluation completed -     Allergies  Allergen Reactions  . Codeine Phosphate Other (See Comments)    Hyperactive     Prior to Admission medications   Medication Sig Start Date End Date Taking? Authorizing Provider  acetaminophen (TYLENOL) 500 MG tablet Take 1,000 mg by mouth daily as needed for mild pain.   Yes [provider]  allopurinol (ZYLOPRIM) 100 MG tablet Take 1 tablet (100 mg total) by mouth daily. 05/08/19  Yes Angiulli, Lavon Paganini, PA-C  amiodarone (PACERONE) 200 MG tablet TAKE 1 TABLET BY MOUTH EVERY DAY Patient taking differently: Take 200 mg by mouth daily.  01/26/20  Yes Larey Dresser, MD  bisoprolol (ZEBETA) 5 MG tablet Take 1 tablet as directed on non dialysis tues,thurs,sat,sun Patient taking differently: Take 5 mg by mouth See admin instructions. Take 1 tablet as directed on non dialysis tues,thurs,sat,sun 02/22/20  Yes Larey Dresser, MD  calcitRIOL (ROCALTROL) 0.25 MCG capsule Take 1 capsule (0.25 mcg total) by mouth  every Monday, Wednesday, and Friday with hemodialysis. 12/30/19  Yes Seawell, Jaimie A, DO  cetirizine (ZYRTEC) 10 MG tablet Take 10 mg by mouth daily as needed for allergies.   Yes [provider]  cinacalcet (SENSIPAR) 90 MG tablet Take 90 mg by mouth daily.   Yes [provider]  diphenhydramine-acetaminophen (TYLENOL PM) 25-500 MG TABS tablet Take 1 tablet by mouth at bedtime as needed (sleep). TAKES AT DIALYSIS FROM TIME TO TIME   Yes [provider]  Diphenhydramine-APAP 12.5-325 MG TABS Take by mouth. 02/19/20 04/12/21 Yes [provider]  ELIQUIS 5 MG TABS tablet TAKE 1 TABLET BY MOUTH TWICE A DAY Patient taking differently: Take 5 mg by mouth daily.  10/21/19  Yes Larey Dresser, MD  ferric citrate (AURYXIA) 1 GM 210 MG(Fe) tablet Take 210 mg by mouth 3  (three) times daily with meals.    Yes [provider]  fluticasone (FLONASE) 50 MCG/ACT nasal spray Place 1 spray into both nostrils daily as needed for allergies or rhinitis.   Yes [provider]  gabapentin (NEURONTIN) 100 MG capsule TAKE 1 CAPSULE AT BEDTIME ON HD DAYS (M,W,F) Patient taking differently: Take 100 mg by mouth every Monday, Wednesday, and Friday. Take on dialysis days 12/17/19  Yes [name], MD  Methoxy PEG-Epoetin Beta (MIRCERA IJ) Mircera 02/24/20 02/22/21 Yes [provider]  midodrine (PROAMATINE) 10 MG tablet Take 1 tablet (10 mg total) by mouth every Monday, Wednesday, and Friday with hemodialysis. 05/08/19  Yes Angiulli, Lavon Paganini, PA-C  Multiple Vitamins-Minerals (CENTRUM SILVER 50+MEN) TABS Take 1 tablet by mouth daily.   Yes [provider]  multivitamin (RENA-VIT) TABS tablet Take 1 tablet by mouth at bedtime. 05/07/19  Yes Angiulli, Lavon Paganini, PA-C  NEOMYCIN-POLYMYXIN-HYDROCORTISONE (CORTISPORIN) 1 % SOLN OTIC solution Apply to toenail avulsion site daily after soaking, cover with bandaid 03/29/20  Yes McDonald, Stephan Minister, DPM  nitroGLYCERIN (NITROSTAT) 0.4 MG SL tablet Place 1 tablet (0.4 mg total) under the tongue every 5 (five) minutes as needed for chest pain. 02/07/20  Yes Sheikh, Omair Latif, DO  ondansetron (ZOFRAN) 4 MG tablet Take 1 tablet (4 mg total) by mouth 2 (two) times daily as needed for nausea or vomiting. 04/06/20  Yes Maximiano Coss, NP  ondansetron (ZOFRAN-ODT) 8 MG disintegrating tablet Take 1 tablet (8 mg total) by mouth every 8 (eight) hours as needed for nausea or vomiting. 10/20/19  Yes [name], MD  pantoprazole (PROTONIX) 40 MG tablet TAKE 1 TABLET (40 MG TOTAL) BY MOUTH DAILY AS NEEDED (ACID REFLUX). 04/21/20  Yes [name], MD  promethazine (PHENERGAN) 12.5 MG tablet Take 1 tablet (12.5 mg total) by mouth every 8 (eight) hours as needed for nausea or vomiting. 03/03/20  Yes [name], MD    sertraline (ZOLOFT) 50 MG tablet TAKE 1 TABLET BY MOUTH EVERY DAY 03/12/20  Yes [name], MD  triamcinolone cream (KENALOG) 0.1 % Apply 1 application topically 2 (two) times daily as needed. 02/16/20  Yes [name], MD  rosuvastatin (CRESTOR) 10 MG tablet Take 1 tablet (10 mg total) by mouth daily. 09/29/19 03/05/20  Larey Dresser, MD    Past Medical History:  Diagnosis Date  . Cervical disc disease   . Chronic kidney disease   . COLONIC POLYPS, HX OF 06/27/2007  . EXOGENOUS OBESITY 01/30/2010  . GERD (gastroesophageal reflux disease)    PMH  . GOUT 01/30/2010  . HYPERCHOLESTEROLEMIA 06/30/2007  . HYPERTENSION  06/27/2007  . LOW BACK PAIN 06/27/2007  . NEPHROLITHIASIS, HX OF 06/27/2007  . OSTEOARTHRITIS 06/27/2007  . SLEEP APNEA, OBSTRUCTIVE, MODERATE 01/27/2009  . Wears dentures    upper    Past Surgical History:  Procedure Laterality Date  . AV FISTULA PLACEMENT Right 01/19/2019   Procedure: RIGHT BRACHIOCEPHALIC ARTERIOVENOUS (AV) FISTULA CREATION;  Surgeon: Angelia Mould, MD;  Location: [place];  Service: Vascular;  Laterality: Right;  . CARDIAC CATHETERIZATION    . CARPAL TUNNEL RELEASE     left hand  . COLONOSCOPY     with polypectomy  . CORONARY ARTERY BYPASS GRAFT N/A 04/16/2019   Procedure: CORONARY ARTERY BYPASS GRAFTING (CABG)X3  , WITH ENDOSCOPIC HARVESTING OF RIGHT GREATER SAPHENOUS VEIN;  Surgeon: Lajuana Matte, MD;  Location: [place];  Service: Open Heart Surgery;  Laterality: N/A;  . CORONARY/GRAFT ACUTE MI REVASCULARIZATION N/A 04/16/2019   Procedure: CORONARY/GRAFT ACUTE MI REVASCULARIZATION;  Surgeon: Burnell Blanks, MD;  Location: [place] [facility];  Service: Cardiovascular;  Laterality: N/A;  . INSERTION OF DIALYSIS CATHETER N/A 04/29/2019   Procedure: INSERTION OF TUNNELED DIALYSIS CATHETER, right internal jugular;  Surgeon: Rosetta Posner, MD;  Location: [place];  Service: Vascular;  Laterality: N/A;  . IR US GUIDE BX ASP/DRAIN   12/29/2019  . JOINT REPLACEMENT Left 2005   knee  . LEFT HEART CATH AND CORS/GRAFTS ANGIOGRAPHY N/A 02/05/2020   Procedure: LEFT HEART CATH AND CORS/GRAFTS ANGIOGRAPHY;  Surgeon: Larey Dresser, MD;  Location: [place] [facility];  Service: Cardiovascular;  Laterality: N/A;  . LUMBAR LAMINECTOMY    . MULTIPLE TOOTH EXTRACTIONS    . PERIPHERAL VASCULAR BALLOON ANGIOPLASTY Right 06/25/2019   Procedure: PERIPHERAL VASCULAR BALLOON ANGIOPLASTY;  Surgeon: Marty Heck, MD;  Location: [place] [facility];  Service: Cardiovascular;  Laterality: Right;  . RADIOFREQUENCY ABLATION KIDNEY    . RIGHT/LEFT HEART CATH AND CORONARY ANGIOGRAPHY N/A 04/16/2019   Procedure: RIGHT/LEFT HEART CATH AND CORONARY ANGIOGRAPHY;  Surgeon: Burnell Blanks, MD;  Location: [place] [facility];  Service: Cardiovascular;  Laterality: N/A;  . TEE WITHOUT CARDIOVERSION N/A 04/16/2019   Procedure: TRANSESOPHAGEAL ECHOCARDIOGRAM (TEE);  Surgeon: Lajuana Matte, MD;  Location: [place];  Service: Open Heart Surgery;  Laterality: N/A;  . TEE WITHOUT CARDIOVERSION N/A 10/08/2019   Procedure: TRANSESOPHAGEAL ECHOCARDIOGRAM (TEE);  Surgeon: Larey Dresser, MD;  Location: [facility];  Service: Cardiovascular;  Laterality: N/A;  . TOTAL KNEE ARTHROPLASTY     left  . VENTRICULAR ASSIST DEVICE INSERTION N/A 04/16/2019   Procedure: VENTRICULAR ASSIST DEVICE INSERTION;  Surgeon: Burnell Blanks, MD;  Location: [place] [facility];  Service: Cardiovascular;  Laterality: N/A;    Social History   Tobacco Use  . Smoking status: Former Smoker    Types: Cigarettes    Quit date: 07/23/1980    Years since quitting: 39.7  . Smokeless tobacco: Never Used  Substance Use Topics  . Alcohol use: Yes    Alcohol/week: 1.0 standard drink    Types: 1 Cans of beer per week    Comment: rare    Family History  Problem Relation Age of Onset  . Hypertension Other   . Diabetes Sister   . Hyperlipidemia Sister   . Sleep apnea  Sister     Review of Systems  Constitutional: Negative for chills, fever and malaise/fatigue.  Respiratory: Negative for cough and shortness of breath.   Cardiovascular: Negative for chest pain, palpitations, orthopnea, leg swelling and PND.  Gastrointestinal: Negative for abdominal pain, nausea and vomiting.  Neurological: Negative for dizziness and headaches.  Psychiatric/Behavioral: Positive for depression. Negative for suicidal ideas. The patient is nervous/anxious and has insomnia.      OBJECTIVE:  Today's Vitals   04/26/20 1054  BP: (!) 81/51  Pulse: 61  Temp: 98.2 F (36.8 C)  SpO2: 96%  Weight: 236 lb (107 kg)  Height: 6' (1.829 m)   Body mass index is 32.01 kg/m.  Wt Readings from Last 3 Encounters:  04/26/20 236 lb (107 kg)  03/17/20 251 lb (113.9 kg)  03/04/20 257 lb 8 oz (116.8 kg)    Physical Exam Vitals and nursing note reviewed.  Constitutional:      Appearance: He is well-developed.  HENT:     Head: Normocephalic and atraumatic.  Eyes:     Extraocular Movements: Extraocular movements intact.     Conjunctiva/sclera: Conjunctivae normal.     Pupils: Pupils are equal, round, and reactive to light.  Cardiovascular:     Rate and Rhythm: Normal rate and regular rhythm.     Heart sounds: No murmur heard.  No friction rub. No gallop.   Pulmonary:     Effort: Pulmonary effort is normal.     Breath sounds: Normal breath sounds. No wheezing, rhonchi or rales.  Musculoskeletal:     Left shoulder: Normal.     Cervical back: Neck supple.       Back:     Right lower leg: No edema.     Left lower leg: No edema.  Skin:    General: Skin is warm and dry.  Neurological:     Mental Status: He is alert and oriented to person, place, and time.     No results found for this or any previous visit (from the past 24 hour(s)).  No results found.   ASSESSMENT and PLAN  1. ESRD (end stage renal disease) on dialysis [facility]) M/W/F - tolerating better, being  evaluated at [place] [facility] for renal transplant (notes available thru care everywhere)  2. Hemodialysis-associated hypotension Asyptomatic, advised discuss with HD team re taking midodrine daily instead of just on HD days  3. Adjustment reaction with anxiety and depression Not well controlled. Increase sertraline to 100mg  daily  4. Nausea Managed well with phenegran prior to HD  5. Upper back pain on left side Discussed conservative measures  Other orders - sertraline (ZOLOFT) 100 MG tablet; Take 1 tablet (100 mg total) by mouth daily.  Return in about 6 weeks (around 06/07/2020) for TOC (needs 40 min appt).    [name], MD [facility] at [place], [place] [address] Ph.  [phone] Fax [phone]

## 2020-04-26 NOTE — Patient Instructions (Signed)
° ° ° °  If you have lab work done today you will be contacted with your lab results within the next 2 weeks.  If you have not heard from us then please contact us. The fastest way to get your results is to register for My Chart. ° ° °IF you received an x-ray today, you will receive an invoice from [facility]. Please contact [facility] at [phone] with questions or concerns regarding your invoice.  ° °IF you received labwork today, you will receive an invoice from [facility]. Please contact [facility] at [phone] with questions or concerns regarding your invoice.  ° °Our billing staff will not be able to assist you with questions regarding bills from these companies. ° °You will be contacted with the lab results as soon as they are available. The fastest way to get your results is to activate your My Chart account. Instructions are located on the last page of this paperwork. If you have not heard from us regarding the results in 2 weeks, please contact this office. °  ° ° ° °

## 2020-04-27 NOTE — Telephone Encounter (Signed)
Sent in application via fax.  Will follow up.  

## 2020-04-27 NOTE — Telephone Encounter (Signed)
Received the patient's application. Called to remind him that I needed the OOP expense report as well. Will fax in once provider's signature is obtained.  Will follow up.

## 2020-04-29 NOTE — Telephone Encounter (Signed)
Confirmed that [employer] received the patients application.  Will follow up.

## 2020-05-02 NOTE — Telephone Encounter (Signed)
Received documentation from [employer]. Patient needs to spend $411.24 in order to qualify for assistance. I have already informed the patient that he would need an OOP expense report sent in to receive an approval. Called and left his daughter a follow up message regarding the OOP expense report.  Charlann Boxer, CPhT

## 2020-05-04 ENCOUNTER — Telehealth (INDEPENDENT_AMBULATORY_CARE_PROVIDER_SITE_OTHER): Payer: Medicare Other | Admitting: Emergency Medicine

## 2020-05-04 ENCOUNTER — Encounter: Payer: Self-pay | Admitting: Emergency Medicine

## 2020-05-04 ENCOUNTER — Other Ambulatory Visit: Payer: Self-pay

## 2020-05-04 DIAGNOSIS — R42 Dizziness and giddiness: Secondary | ICD-10-CM | POA: Diagnosis not present

## 2020-05-04 MED ORDER — MECLIZINE HCL 12.5 MG PO TABS: 12.5000 mg | ORAL_TABLET | Freq: Three times a day (TID) | ORAL | 0 refills | Status: AC | PRN

## 2020-05-04 NOTE — Progress Notes (Signed)
Telemedicine Encounter- SOAP NOTE Established Patient  This telephone encounter was conducted with the patient's (or proxy's) verbal consent via audio telecommunications: yes/no: Yes Patient was instructed to have this encounter in a suitably private space; and to only have persons present to whom they give permission to participate. In addition, patient identity was confirmed by use of name plus two identifiers (DOB and address).  I discussed the limitations, risks, security and privacy concerns of performing an evaluation and management service by telephone and the availability of in person appointments. I also discussed with the patient that there may be a patient responsible charge related to this service. The patient expressed understanding and agreed to proceed.  I spent a total of TIME; 0 MIN TO 60 MIN: 10 minutes talking with the patient or their proxy.  Chief Complaint  Patient presents with   Dizziness    was advised by Dr Pamella Pert to call back for prescription if vertigo still going on. episode this morning 5 am     Subjective   Alex Williams is a 69 y.o. male established patient. Telephone visit today complaining of dizziness.  Has had 2-3 episodes in the past couple weeks.  Was advised to call back for prescription if it happened again.  Had similar episode today at 5 in the morning the lasted about 5 to 6 hours with nausea but no vomiting.  Dialysis patient.  No other associated symptoms.  HPI   Patient Active Problem List   Diagnosis Date Noted   Adjustment reaction with anxiety and depression 04/26/2020   CHF (congestive heart failure) ([place]) 02/05/2020   Hypotension 02/05/2020   NSTEMI (non-ST elevated myocardial infarction) ([place]) 02/05/2020   Chest pain 02/04/2020   Acute on chronic systolic CHF (congestive heart failure) ([place]) 02/04/2020   Atrial fibrillation, chronic ([place]) 01/21/2020   GERD (gastroesophageal reflux disease) 01/21/2020   Cutaneous  abscess, unspecified 12/30/2019   Cellulitis of right lower extremity 12/28/2019   Hypercalcemia 12/14/2019   Primary osteoarthritis of right knee 10/29/2019   Body mass index 40.0-44.9, adult ([place]) 10/29/2019   Morbid obesity ([place]) 10/29/2019   Fluid overload, unspecified 08/18/2019   Injury of left ulnar nerve at hand level 06/26/2019   Lesion of left ulnar nerve 06/26/2019   Moderate protein-calorie malnutrition ([place]) 05/09/2019   Hypokalemia 05/09/2019   Recurrent knee pain 05/01/2019   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 05/01/2019   Debility 04/30/2019   ESRD (end stage renal disease) on dialysis ([place])    Hemodialysis-associated hypotension    Anemia due to end stage renal disease ([place])    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   Coronary artery disease 04/16/2019   Acute ST elevation myocardial infarction (STEMI) involving left anterior descending (LAD) coronary artery (H[facility])    Acute ST elevation myocardial infarction (STEMI) of anterior wall (H[facility])    Pain, unspecified 04/09/2019   Male erectile dysfunction, unspecified 04/09/2019   Iron deficiency anemia, unspecified 04/09/2019   Insomnia, unspecified 04/09/2019   Encounter for immunization 04/09/2019   Coagulation defect, unspecified ([place]) 04/09/2019   Acute on chronic renal failure ([place]) 08/26/2018   Nonspecific abnormal electrocardiogram (ECG) (EKG) 08/25/2018   Anemia in CKD (chronic kidney disease) 08/25/2018   Elevated troponin 08/25/2018   SOB (shortness of breath) 08/25/2018   Morbid obesity with BMI of 45.0-49.9, adult ([place]) 12/03/2013   Renal mass 12/02/2013   Lower extremity edema 03/16/2013   GOUT 01/30/2010   Obstructive sleep apnea 01/27/2009  HYPERCHOLESTEROLEMIA 06/30/2007   Essential hypertension 06/27/2007   Osteoarthritis 06/27/2007   LOW BACK PAIN 06/27/2007   History of colonic polyps 06/27/2007   NEPHROLITHIASIS, HX OF 06/27/2007    Past Medical  History:  Diagnosis Date   Cervical disc disease    Chronic kidney disease    COLONIC POLYPS, HX OF 06/27/2007   EXOGENOUS OBESITY 01/30/2010   GERD (gastroesophageal reflux disease)    PMH   GOUT 01/30/2010   HYPERCHOLESTEROLEMIA 06/30/2007   HYPERTENSION 06/27/2007   LOW BACK PAIN 06/27/2007   NEPHROLITHIASIS, HX OF 06/27/2007   OSTEOARTHRITIS 06/27/2007   SLEEP APNEA, OBSTRUCTIVE, MODERATE 01/27/2009   Wears dentures    upper    Current Outpatient Medications  Medication Sig Dispense Refill   acetaminophen (TYLENOL) 500 MG tablet Take 1,000 mg by mouth daily as needed for mild pain.     allopurinol (ZYLOPRIM) 100 MG tablet Take 1 tablet (100 mg total) by mouth daily. 30 tablet 0   amiodarone (PACERONE) 200 MG tablet TAKE 1 TABLET BY MOUTH EVERY DAY (Patient taking differently: Take 200 mg by mouth daily. ) 90 tablet 3   bisoprolol (ZEBETA) 5 MG tablet Take 1 tablet as directed on non dialysis tues,thurs,sat,sun (Patient taking differently: Take 5 mg by mouth See admin instructions. Take 1 tablet as directed on non dialysis tues,thurs,sat,sun) 90 tablet 0   calcitRIOL (ROCALTROL) 0.25 MCG capsule Take 1 capsule (0.25 mcg total) by mouth every Monday, Wednesday, and Friday with hemodialysis. 30 capsule 0   cetirizine (ZYRTEC) 10 MG tablet Take 10 mg by mouth daily as needed for allergies.     cinacalcet (SENSIPAR) 90 MG tablet Take 90 mg by mouth daily.     diphenhydramine-acetaminophen (TYLENOL PM) 25-500 MG TABS tablet Take 1 tablet by mouth at bedtime as needed (sleep). TAKES AT DIALYSIS FROM TIME TO TIME     Diphenhydramine-APAP 12.5-325 MG TABS Take by mouth.     ELIQUIS 5 MG TABS tablet TAKE 1 TABLET BY MOUTH TWICE A DAY (Patient taking differently: Take 5 mg by mouth daily. ) 180 tablet 3   ferric citrate (AURYXIA) 1 GM 210 MG(Fe) tablet Take 210 mg by mouth 3 (three) times daily with meals.      fluticasone (FLONASE) 50 MCG/ACT nasal spray Place 1 spray into  both nostrils daily as needed for allergies or rhinitis.     gabapentin (NEURONTIN) 100 MG capsule TAKE 1 CAPSULE AT BEDTIME ON HD DAYS (M,W,F) (Patient taking differently: Take 100 mg by mouth every Monday, Wednesday, and Friday. Take on dialysis days) 30 capsule 2   Methoxy PEG-Epoetin Beta (MIRCERA IJ) Mircera     midodrine (PROAMATINE) 10 MG tablet Take 1 tablet (10 mg total) by mouth every Monday, Wednesday, and Friday with hemodialysis. 30 tablet 0   Multiple Vitamins-Minerals (CENTRUM SILVER 50+MEN) TABS Take 1 tablet by mouth daily.     multivitamin (RENA-VIT) TABS tablet Take 1 tablet by mouth at bedtime. 30 tablet 0   NEOMYCIN-POLYMYXIN-HYDROCORTISONE (CORTISPORIN) 1 % SOLN OTIC solution Apply to toenail avulsion site daily after soaking, cover with bandaid 10 mL 0   nitroGLYCERIN (NITROSTAT) 0.4 MG SL tablet Place 1 tablet (0.4 mg total) under the tongue every 5 (five) minutes as needed for chest pain. 30 tablet 0   pantoprazole (PROTONIX) 40 MG tablet TAKE 1 TABLET (40 MG TOTAL) BY MOUTH DAILY AS NEEDED (ACID REFLUX). 90 tablet 1   promethazine (PHENERGAN) 12.5 MG tablet Take 1 tablet (12.5 mg total) by mouth  every 8 (eight) hours as needed for nausea or vomiting. 20 tablet 5   sertraline (ZOLOFT) 100 MG tablet Take 1 tablet (100 mg total) by mouth daily. 30 tablet 3   triamcinolone cream (KENALOG) 0.1 % Apply 1 application topically 2 (two) times daily as needed. 45 g 0   rosuvastatin (CRESTOR) 10 MG tablet Take 1 tablet (10 mg total) by mouth daily. 90 tablet 3   No current facility-administered medications for this visit.    Allergies  Allergen Reactions   Codeine Phosphate Other (See Comments)    Hyperactive     Social History   Socioeconomic History   Marital status: Legally Separated    Spouse name: Not on file   Number of children: 2   Years of education: Not on file   Highest education level: Not on file  Occupational History   Not on file    Tobacco Use   Smoking status: Former Smoker    Types: Cigarettes    Quit date: 07/23/1980    Years since quitting: 39.8   Smokeless tobacco: Never Used  Vaping Use   Vaping Use: Never used  Substance and Sexual Activity   Alcohol use: Yes    Alcohol/week: 1.0 standard drink    Types: 1 Cans of beer per week    Comment: rare   Drug use: Never   Sexual activity: Not Currently  Other Topics Concern   Not on file  Social History Narrative   Pt lives alone. He has one son who lives within 3 miles who he usually sees daily. He has two sisters living in the next town over who he also sees on a weekly basis. He is a religious man who attends church at least 3 Sundays each month.   He operates [employer] full time.    Social Determinants of Health   Financial Resource Strain:    Difficulty of Paying Living Expenses: Not on file  Food Insecurity:    Worried About Charity fundraiser in the Last Year: Not on file   [employer] of Food in the Last Year: Not on file  Transportation Needs:    Lack of Transportation (Medical): Not on file   Lack of Transportation (Non-Medical): Not on file  Physical Activity:    Days of Exercise per Week: Not on file   Minutes of Exercise per Session: Not on file  Stress:    Feeling of Stress : Not on file  Social Connections:    Frequency of Communication with Friends and Family: Not on file   Frequency of Social Gatherings with Friends and Family: Not on file   Attends Religious Services: Not on file   Active Member of Clubs or Organizations: Not on file   Attends Archivist Meetings: Not on file   Marital Status: Not on file  Intimate Partner Violence:    Fear of Current or Ex-Partner: Not on file   Emotionally Abused: Not on file   Physically Abused: Not on file   Sexually Abused: Not on file    Review of Systems  Constitutional: Negative.  Negative for chills and fever.  HENT: Negative.  Negative for congestion  and sore throat.   Respiratory: Negative.  Negative for cough and shortness of breath.   Cardiovascular: Negative.  Negative for chest pain and palpitations.  Gastrointestinal: Positive for nausea.  Skin: Negative.  Negative for rash.  Neurological: Positive for dizziness. Negative for focal weakness and headaches.  All other  systems reviewed and are negative.   Objective  Alert and oriented x3 in no apparent respiratory distress. Vitals as reported by the patient: There were no vitals filed for this visit.  There are no diagnoses linked to this encounter. Keighan was seen today for dizziness.  Diagnoses and all orders for this visit:  Dizziness  Other orders -     meclizine (ANTIVERT) 12.5 MG tablet; Take 1 tablet (12.5 mg total) by mouth 3 (three) times daily as needed for dizziness.      I discussed the assessment and treatment plan with the patient. The patient was provided an opportunity to ask questions and all were answered. The patient agreed with the plan and demonstrated an understanding of the instructions.   The patient was advised to call back or seek an in-person evaluation if the symptoms worsen or if the condition fails to improve as anticipated.  I provided 10 minutes of non-face-to-face time during this encounter.  Horald Pollen, MD  Primary Care at [facility]

## 2020-05-25 ENCOUNTER — Other Ambulatory Visit: Payer: Self-pay | Admitting: Registered Nurse

## 2020-05-25 DIAGNOSIS — R112 Nausea with vomiting, unspecified: Secondary | ICD-10-CM

## 2020-05-25 NOTE — Telephone Encounter (Signed)
Requested medication (s) are due for refill today: no  Requested medication (s) are on the active medication list: no  Last refill:  Discontinued on 04/26/20  Future visit scheduled: yes  Notes to clinic:  Please review for refill. Refill not delegated per protocol.     Requested Prescriptions  Pending Prescriptions Disp Refills   ondansetron (ZOFRAN) 4 MG tablet [Pharmacy Med Name: ONDANSETRON HCL 4 MG TABLET] 20 tablet 0    Sig: TAKE 1 TABLET BY MOUTH TWICE A DAY AS NEEDED FOR NAUSEA OR VOMITING      Not Delegated - Gastroenterology: Antiemetics Failed - 05/25/2020 12:19 PM      Failed - This refill cannot be delegated      Passed - Valid encounter within last 6 months    Recent Outpatient Visits           3 weeks ago Dizziness   Primary Care at [facility], [place], [place]   4 weeks ago ESRD (end stage renal disease) on dialysis [facility])   [facility] at [facility], Lilia Argue, MD   1 month ago Nausea and vomiting, intractability of vomiting not specified, unspecified vomiting type   [facility] at [facility], [place], NP   2 months ago Adjustment reaction with anxiety and depression   [facility] at [facility], Lilia Argue, MD   3 months ago ESRD (end stage renal disease) on dialysis [facility])   [facility] at [facility], Lilia Argue, MD       Future Appointments             In 1 week Just, Laurita Quint, FNP Primary Care at [place], [facility]   In 4 weeks Just, Laurita Quint, FNP Primary Care at [place], [facility]

## 2020-06-07 ENCOUNTER — Ambulatory Visit (INDEPENDENT_AMBULATORY_CARE_PROVIDER_SITE_OTHER): Payer: Medicare Other | Admitting: Family Medicine

## 2020-06-07 ENCOUNTER — Other Ambulatory Visit: Payer: Self-pay

## 2020-06-07 ENCOUNTER — Encounter: Payer: Self-pay | Admitting: Family Medicine

## 2020-06-07 ENCOUNTER — Telehealth (HOSPITAL_COMMUNITY): Payer: Self-pay | Admitting: Pharmacy Technician

## 2020-06-07 VITALS — BP 121/73 | HR 61 | Temp 97.9°F | Ht 72.0 in | Wt 226.0 lb

## 2020-06-07 DIAGNOSIS — G4701 Insomnia due to medical condition: Secondary | ICD-10-CM | POA: Diagnosis not present

## 2020-06-07 DIAGNOSIS — F4323 Adjustment disorder with mixed anxiety and depressed mood: Secondary | ICD-10-CM | POA: Diagnosis not present

## 2020-06-07 DIAGNOSIS — R110 Nausea: Secondary | ICD-10-CM

## 2020-06-07 MED ORDER — ONDANSETRON HCL 4 MG PO TABS: 4.0000 mg | ORAL_TABLET | Freq: Three times a day (TID) | ORAL | 0 refills | Status: AC | PRN

## 2020-06-07 MED ORDER — SERTRALINE HCL 50 MG PO TABS: 100.0000 mg | ORAL_TABLET | Freq: Every day | ORAL | 3 refills | Status: DC

## 2020-06-07 MED ORDER — ALPRAZOLAM 0.25 MG PO TABS: 0.2500 mg | ORAL_TABLET | Freq: Every evening | ORAL | 0 refills | Status: DC | PRN
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
## 2020-06-07 NOTE — Patient Instructions (Addendum)
Let me know if nausea doesn't improve or depression symptoms worsen.  Use Ondansetron for nausea as needed.  Xanax .25mg  for sleep and anxiety prior to dialysis.      Nausea and Vomiting, Adult Nausea is feeling sick to your stomach or feeling that you are about to throw up (vomit). Vomiting is when food in your stomach is thrown up and out of the mouth. Throwing up can make you feel weak. It can also make you lose too much water in your body (get dehydrated). If you lose too much water in your body, you may:  Feel tired.  Feel thirsty.  Have a dry mouth.  Have cracked lips.  Go pee (urinate) less often. Older adults and people with other diseases or a weak body defense system (immune system) are at higher risk for losing too much water in the body. If you feel sick to your stomach and you throw up, it is important to follow instructions from your doctor about how to take care of yourself. Follow these instructions at home: Watch your symptoms for any changes. Tell your doctor about them. Follow these instructions to care for yourself at home. Eating and drinking      Take an ORS (oral rehydration solution). This is a drink that is sold at pharmacies and stores.  Drink clear fluids in small amounts as you are able, such as: ? Water. ? Ice chips. ? Fruit juice that has water added (diluted fruit juice). ? Low-calorie sports drinks.  Eat bland, easy-to-digest foods in small amounts as you are able, such as: ? Bananas. ? Applesauce. ? Rice. ? Low-fat (lean) meats. ? Toast. ? Crackers.  Avoid drinking fluids that have a lot of sugar or caffeine in them. This includes energy drinks, sports drinks, and soda.  Avoid alcohol.  Avoid spicy or fatty foods. General instructions  Take over-the-counter and prescription medicines only as told by your doctor.  Drink enough fluid to keep your pee (urine) pale yellow.  Wash your hands often with soap and water. If you cannot  use soap and water, use hand sanitizer.  Make sure that all people in your home wash their hands well and often.  Rest at home while you get better.  Watch your condition for any changes.  Take slow and deep breaths when you feel sick to your stomach.  Keep all follow-up visits as told by your doctor. This is important. Contact a doctor if:  Your symptoms get worse.  You have new symptoms.  You have a fever.  You cannot drink fluids without throwing up.  You feel sick to your stomach for more than 2 days.  You feel light-headed or dizzy.  You have a headache.  You have muscle cramps.  You have a rash.  You have pain while peeing. Get help right away if:  You have pain in your chest, neck, arm, or jaw.  You feel very weak or you pass out (faint).  You throw up again and again.  You have throw up that is bright red or looks like black coffee grounds.  You have bloody or black poop (stools) or poop that looks like tar.  You have a very bad headache, a stiff neck, or both.  You have very bad pain, cramping, or bloating in your belly (abdomen).  You have trouble breathing.  You are breathing very quickly.  Your heart is beating very quickly.  Your skin feels cold and clammy.  You feel confused.  You have signs of losing too much water in your body, such as: ? Dark pee, very little pee, or no pee. ? Cracked lips. ? Dry mouth. ? Sunken eyes. ? Sleepiness. ? Weakness. These symptoms may be an emergency. Do not wait to see if the symptoms will go away. Get medical help right away. Call your local emergency services (911 in the [place] Do not drive yourself to the hospital. Summary  Nausea is feeling sick to your stomach or feeling that you are about to throw up (vomit). Vomiting is when food in your stomach is thrown up and out of the mouth.  Follow instructions from your doctor about eating and drinking to keep from losing too much water in your  body.  Take over-the-counter and prescription medicines only as told by your doctor.  Contact your doctor if your symptoms get worse or you have new symptoms.  Keep all follow-up visits as told by your doctor. This is important. This information is not intended to replace advice given to you by your health care provider. Make sure you discuss any questions you have with your health care provider. Document Revised: 10/31/2018 Document Reviewed: 12/17/2017 Elsevier Patient Education  [employer].   If you have lab work done today you will be contacted with your lab results within the next 2 weeks.  If you have not heard from [place] then please contact us. The fastest way to get your results is to register for My Chart.   IF you received an x-ray today, you will receive an invoice from [facility]. Please contact [facility] at [phone] with questions or concerns regarding your invoice.   IF you received labwork today, you will receive an invoice from [place]. Please contact [facility] at [phone] with questions or concerns regarding your invoice.   Our billing staff will not be able to assist you with questions regarding bills from these companies.  You will be contacted with the lab results as soon as they are available. The fastest way to get your results is to activate your My Chart account. Instructions are located on the last page of this paperwork. If you have not heard from [place] regarding the results in 2 weeks, please contact this office.

## 2020-06-07 NOTE — Progress Notes (Signed)
11/16/202112:48 PM  Alex Williams 1951-03-20, 69 y.o., male [id number]  Chief Complaint  Patient presents with  . Medication Refill    nausea daily , eating less due to - prefers promethazine   . Anxiety    cant sleep on night before dyalisis    HPI:   Patient is a 69 y.o. male with past medical history significant for HTN, HLP, gout,ESRDon HD, MI s/p 3V CABG, knee OA who presents today for routine follow-up.  ESRD (end stage renal disease) on dialysis [facility]) M/W/F - tolerating better, being evaluated at [facility] for renal transplant (notes available thru care everywhere) Next appointment in March  Hemodialysis-associated hypotension Asyptomatic, advised discuss with HD team re taking midodrine daily instead of [place] Seefeldt on HD days  Adjustment reaction with anxiety and depression Not well controlled. Increase sertraline to 100mg  daily - sertraline (ZOLOFT) 100 MG tablet; Take 1 tablet (100 mg total) by mouth daily. Didn't notice improvement with increased dose Noticed increased nausea  Nausea/Vomiting Promethazine daily not helping Almost daily vomiting Not correlated to food, or time of day Tried bland diet  Dizziness 10/13 Meclizine prescribed Taken 2-3 times Now improved  Insomnia/Anxiety Worse the nights before dialysis Not currently on anything for this Takes Tylenol PM and melatonin for sleep Takes sisters xanax    Depression screen [facility] 2/9 06/07/2020 04/26/2020 04/06/2020  Decreased Interest 0 3 0  Down, Depressed, Hopeless 0 2 0  PHQ - 2 Score 0 5 0  Altered sleeping 0 3 -  Tired, decreased energy 0 3 -  Change in appetite 0 1 -  Feeling bad or failure about yourself  0 0 -  Trouble concentrating 0 1 -  Moving slowly or fidgety/restless 0 1 -  Suicidal thoughts 0 0 -  PHQ-9 Score 0 14 -  Difficult doing work/chores - - -  Some recent data might be hidden    Fall Risk  06/07/2020 05/04/2020 04/06/2020 03/03/2020 02/16/2020  Falls in the  past year? 0 0 1 0 1  Number falls in past yr: 0 0 0 0 0  Comment - - - - -  Injury with Fall? 0 0 0 0 1  Comment - - - - -  Risk for fall due to : - - - - -  Follow up Falls evaluation completed Falls evaluation completed Falls evaluation completed Falls evaluation completed Falls evaluation completed     Allergies  Allergen Reactions  . Codeine Phosphate Other (See Comments)    Hyperactive     Prior to Admission medications   Medication Sig Start Date End Date Taking? Authorizing Provider  acetaminophen (TYLENOL) 500 MG tablet Take 1,000 mg by mouth daily as needed for mild pain.    [provider]  allopurinol (ZYLOPRIM) 100 MG tablet Take 1 tablet (100 mg total) by mouth daily. 05/08/19   Angiulli, Lavon Paganini, PA-C  amiodarone (PACERONE) 200 MG tablet TAKE 1 TABLET BY MOUTH EVERY DAY Patient taking differently: Take 200 mg by mouth daily.  01/26/20   Larey Dresser, MD  bisoprolol (ZEBETA) 5 MG tablet Take 1 tablet as directed on non dialysis tues,thurs,sat,sun Patient taking differently: Take 5 mg by mouth See admin instructions. Take 1 tablet as directed on non dialysis tues,thurs,sat,sun 02/22/20   Larey Dresser, MD  calcitRIOL (ROCALTROL) 0.25 MCG capsule Take 1 capsule (0.25 mcg total) by mouth every Monday, Wednesday, and Friday with hemodialysis. 12/30/19   Seawell, Jaimie A, DO  cetirizine (ZYRTEC)  10 MG tablet Take 10 mg by mouth daily as needed for allergies.    [provider]  cinacalcet (SENSIPAR) 90 MG tablet Take 90 mg by mouth daily.    [provider]  diphenhydramine-acetaminophen (TYLENOL PM) 25-500 MG TABS tablet Take 1 tablet by mouth at bedtime as needed (sleep). TAKES AT DIALYSIS FROM TIME TO TIME    [provider]  Diphenhydramine-APAP 12.5-325 MG TABS Take by mouth. 02/19/20 04/12/21  [provider]  ELIQUIS 5 MG TABS tablet TAKE 1 TABLET BY MOUTH TWICE A DAY Patient taking differently: Take 5 mg by mouth daily.   10/21/19   Larey Dresser, MD  ferric citrate (AURYXIA) 1 GM 210 MG(Fe) tablet Take 210 mg by mouth 3 (three) times daily with meals.     [provider]  fluticasone (FLONASE) 50 MCG/ACT nasal spray Place 1 spray into both nostrils daily as needed for allergies or rhinitis.    [provider]  gabapentin (NEURONTIN) 100 MG capsule TAKE 1 CAPSULE AT BEDTIME ON HD DAYS (M,W,F) Patient taking differently: Take 100 mg by mouth every Monday, Wednesday, and Friday. Take on dialysis days 12/17/19   [name], MD  meclizine (ANTIVERT) 12.5 MG tablet Take 1 tablet (12.5 mg total) by mouth 3 (three) times daily as needed for dizziness. 05/04/20   Horald Pollen, MD  Methoxy PEG-Epoetin Beta (MIRCERA IJ) Mircera 02/24/20 02/22/21  [provider]  midodrine (PROAMATINE) 10 MG tablet Take 1 tablet (10 mg total) by mouth every Monday, Wednesday, and Friday with hemodialysis. 05/08/19   Angiulli, Lavon Paganini, PA-C  Multiple Vitamins-Minerals (CENTRUM SILVER 50+MEN) TABS Take 1 tablet by mouth daily.    [provider]  multivitamin (RENA-VIT) TABS tablet Take 1 tablet by mouth at bedtime. 05/07/19   Angiulli, Lavon Paganini, PA-C  NEOMYCIN-POLYMYXIN-HYDROCORTISONE (CORTISPORIN) 1 % SOLN OTIC solution Apply to toenail avulsion site daily after soaking, cover with bandaid 03/29/20   McDonald, Stephan Minister, DPM  nitroGLYCERIN (NITROSTAT) 0.4 MG SL tablet Place 1 tablet (0.4 mg total) under the tongue every 5 (five) minutes as needed for chest pain. 02/07/20   Sheikh, Omair Latif, DO  pantoprazole (PROTONIX) 40 MG tablet TAKE 1 TABLET (40 MG TOTAL) BY MOUTH DAILY AS NEEDED (ACID REFLUX). 04/21/20   [name], MD  promethazine (PHENERGAN) 12.5 MG tablet Take 1 tablet (12.5 mg total) by mouth every 8 (eight) hours as needed for nausea or vomiting. 03/03/20   [name], MD  rosuvastatin (CRESTOR) 10 MG tablet Take 1 tablet (10 mg total) by mouth daily. 09/29/19 03/05/20  Larey Dresser, MD  sertraline (ZOLOFT) 100 MG tablet Take 1 tablet (100 mg total) by mouth daily. 04/26/20   [name], MD  triamcinolone cream (KENALOG) 0.1 % Apply 1 application topically 2 (two) times daily as needed. 02/16/20   [name], MD    Past Medical History:  Diagnosis Date  . Cervical disc disease   . Chronic kidney disease   . COLONIC POLYPS, HX OF 06/27/2007  . EXOGENOUS OBESITY 01/30/2010  . GERD (gastroesophageal reflux disease)    PMH  . GOUT 01/30/2010  . HYPERCHOLESTEROLEMIA 06/30/2007  . HYPERTENSION 06/27/2007  . LOW BACK PAIN 06/27/2007  . NEPHROLITHIASIS, HX OF 06/27/2007  . OSTEOARTHRITIS 06/27/2007  . SLEEP APNEA, OBSTRUCTIVE, MODERATE 01/27/2009  . Wears dentures    upper    Past Surgical History:  Procedure Laterality Date  . AV FISTULA PLACEMENT Right 01/19/2019  Procedure: RIGHT BRACHIOCEPHALIC ARTERIOVENOUS (AV) FISTULA CREATION;  Surgeon: Angelia Mould, MD;  Location: [facility];  Service: Vascular;  Laterality: Right;  . CARDIAC CATHETERIZATION    . CARPAL TUNNEL RELEASE     left hand  . COLONOSCOPY     with polypectomy  . CORONARY ARTERY BYPASS GRAFT N/A 04/16/2019   Procedure: CORONARY ARTERY BYPASS GRAFTING (CABG)X3  , WITH ENDOSCOPIC HARVESTING OF RIGHT GREATER SAPHENOUS VEIN;  Surgeon: Lajuana Matte, MD;  Location: [facility];  Service: Open Heart Surgery;  Laterality: N/A;  . CORONARY/GRAFT ACUTE MI REVASCULARIZATION N/A 04/16/2019   Procedure: CORONARY/GRAFT ACUTE MI REVASCULARIZATION;  Surgeon: Burnell Blanks, MD;  Location: [place] [facility];  Service: Cardiovascular;  Laterality: N/A;  . INSERTION OF DIALYSIS CATHETER N/A 04/29/2019   Procedure: INSERTION OF TUNNELED DIALYSIS CATHETER, right internal jugular;  Surgeon: Rosetta Posner, MD;  Location: [place];  Service: Vascular;  Laterality: N/A;  . IR US GUIDE BX ASP/DRAIN  12/29/2019  . JOINT REPLACEMENT Left 2005   knee  . LEFT HEART CATH AND CORS/GRAFTS ANGIOGRAPHY N/A 02/05/2020    Procedure: LEFT HEART CATH AND CORS/GRAFTS ANGIOGRAPHY;  Surgeon: Larey Dresser, MD;  Location: [place] [facility];  Service: Cardiovascular;  Laterality: N/A;  . LUMBAR LAMINECTOMY    . MULTIPLE TOOTH EXTRACTIONS    . PERIPHERAL VASCULAR BALLOON ANGIOPLASTY Right 06/25/2019   Procedure: PERIPHERAL VASCULAR BALLOON ANGIOPLASTY;  Surgeon: Marty Heck, MD;  Location: [place] [facility];  Service: Cardiovascular;  Laterality: Right;  . RADIOFREQUENCY ABLATION KIDNEY    . RIGHT/LEFT HEART CATH AND CORONARY ANGIOGRAPHY N/A 04/16/2019   Procedure: RIGHT/LEFT HEART CATH AND CORONARY ANGIOGRAPHY;  Surgeon: Burnell Blanks, MD;  Location: [facility];  Service: Cardiovascular;  Laterality: N/A;  . TEE WITHOUT CARDIOVERSION N/A 04/16/2019   Procedure: TRANSESOPHAGEAL ECHOCARDIOGRAM (TEE);  Surgeon: Lajuana Matte, MD;  Location: [place];  Service: Open Heart Surgery;  Laterality: N/A;  . TEE WITHOUT CARDIOVERSION N/A 10/08/2019   Procedure: TRANSESOPHAGEAL ECHOCARDIOGRAM (TEE);  Surgeon: Larey Dresser, MD;  Location: [facility] ENDOSCOPY;  Service: Cardiovascular;  Laterality: N/A;  . TOTAL KNEE ARTHROPLASTY     left  . VENTRICULAR ASSIST DEVICE INSERTION N/A 04/16/2019   Procedure: VENTRICULAR ASSIST DEVICE INSERTION;  Surgeon: Burnell Blanks, MD;  Location: [place] [facility];  Service: Cardiovascular;  Laterality: N/A;    Social History   Tobacco Use  . Smoking status: Former Smoker    Types: Cigarettes    Quit date: 07/23/1980    Years since quitting: 39.9  . Smokeless tobacco: Never Used  Substance Use Topics  . Alcohol use: Yes    Alcohol/week: 1.0 standard drink    Types: 1 Cans of beer per week    Comment: rare    Family History  Problem Relation Age of [facility]  . Hypertension Other   . Diabetes Sister   . Hyperlipidemia Sister   . Sleep apnea Sister     Review of Systems  Constitutional: Positive for malaise/fatigue. Negative for chills and fever.    Eyes: Negative for blurred vision and double vision.  Respiratory: Positive for shortness of breath. Negative for cough and wheezing.   Cardiovascular: Negative for chest pain, palpitations and leg swelling.  Gastrointestinal: Positive for nausea and vomiting. Negative for abdominal pain, blood in stool, constipation, diarrhea and heartburn.  Genitourinary: Negative for dysuria, frequency and hematuria.  Musculoskeletal: Negative for back pain and joint pain.  Skin: Negative  for rash.  Neurological: Positive for weakness. Negative for dizziness and headaches.     OBJECTIVE:  Today's Vitals   06/07/20 1106  BP: 121/73  Pulse: 61  Temp: 97.9 F (36.6 C)  SpO2: 100%  Weight: 226 lb (102.5 kg)  Height: 6' (1.829 m)   Body mass index is 30.65 kg/m.   Physical Exam Vitals reviewed.  Constitutional:      Appearance: Normal appearance.  HENT:     Head: Normocephalic and atraumatic.  Eyes:     Conjunctiva/sclera: Conjunctivae normal.     Pupils: Pupils are equal, round, and reactive to light.  Cardiovascular:     Rate and Rhythm: Normal rate and regular rhythm.     Pulses: Normal pulses.     Heart sounds: Normal heart sounds. No murmur heard.  No friction rub. No gallop.   Pulmonary:     Effort: Pulmonary effort is normal. No respiratory distress.     Breath sounds: Normal breath sounds. No stridor. No wheezing or rales.  Abdominal:     General: Bowel sounds are normal.     Palpations: Abdomen is soft.     Tenderness: There is no abdominal tenderness.  Musculoskeletal:     Right lower leg: No edema.     Left lower leg: No edema.  Skin:    General: Skin is warm and dry.  Neurological:     General: No focal deficit present.     Mental Status: He is alert and oriented to person, place, and time.  Psychiatric:        Mood and Affect: Mood normal.        Behavior: Behavior normal.    No results found for this or any previous visit (from the past 24 hour(s)).  No  results found.  ASSESSMENT and PLAN  Problem List Items Addressed This Visit      Other   Insomnia, unspecified   Relevant Medications   ALPRAZolam (XANAX) 0.25 MG tablet   Adjustment reaction with anxiety and depression   Relevant Medications   sertraline (ZOLOFT) 50 MG tablet: Decreased dose due to increasing nausea Discussed if depression worsens we can change SSRI   ALPRAZolam (XANAX) 0.25 MG tablet As need for sleep and anxiety prior to days of dialysis Discussed r/se/b.    Other Visit Diagnoses    Nausea    -  Primary   Relevant Medications   ondansetron (ZOFRAN) 4 MG tablet Phenergan not working, will try Zofran      Return in about 6 weeks (around 07/19/2020).   Huston Foley Melane Windholz, FNP-BC Primary Care at [facility], [place] [address] Ph.  [phone] Fax [phone]

## 2020-06-10 ENCOUNTER — Other Ambulatory Visit: Payer: Self-pay

## 2020-06-10 ENCOUNTER — Other Ambulatory Visit: Payer: Self-pay | Admitting: Family Medicine

## 2020-06-10 DIAGNOSIS — F4323 Adjustment disorder with mixed anxiety and depressed mood: Secondary | ICD-10-CM

## 2020-06-10 NOTE — Telephone Encounter (Signed)
Requested medication (s) are due for refill today:  No  Requested medication (s) are on the active medication list:  Yes  Future visit scheduled:  Yes  Last Refill: 06/07/20: # 90/ RF x 3  Notes to Clinic:  please review office note of 11/16; per note pt. Was to decrease Sertraline to 50 mg daily, due to nausea with the 100 mg daily.  Can you rewrite the script to reflect change in dose please?  Pt. Asking for 90 day supply.   Requested Prescriptions  Pending Prescriptions Disp Refills   sertraline (ZOLOFT) 50 MG tablet [Pharmacy Med Name: SERTRALINE HCL 50 MG TABLET] 180 tablet 2    Sig: TAKE 2 TABLETS BY MOUTH EVERY DAY      Psychiatry:  Antidepressants - SSRI Passed - 06/10/2020  3:29 PM      Passed - Valid encounter within last 6 months    Recent Outpatient Visits           3 days ago Nausea   [facility] at [place] [facility], Laurita Quint, FNP   1 month ago Dizziness   [facility] at [facility], [place], MD   1 month ago ESRD (end stage renal disease) on dialysis [facility])   [facility], Lilia Argue, MD   2 months ago Nausea and vomiting, intractability of vomiting not specified, unspecified vomiting type   [facility], [place], NP   3 months ago Adjustment reaction with anxiety and depression   [facility], Lilia Argue, MD       Future Appointments             In 1 month Just, Laurita Quint, FNP Primary Care at [place], [facility]

## 2020-06-10 NOTE — Telephone Encounter (Signed)
addressed

## 2020-06-21 ENCOUNTER — Encounter (HOSPITAL_COMMUNITY): Payer: Self-pay | Admitting: Cardiology

## 2020-06-21 ENCOUNTER — Other Ambulatory Visit: Payer: Self-pay

## 2020-06-21 ENCOUNTER — Ambulatory Visit (HOSPITAL_COMMUNITY)
Admission: RE | Admit: 2020-06-21 | Discharge: 2020-06-21 | Disposition: A | Discharge status: Home / Self Care | Payer: Medicare Other | Source: Ambulatory Visit | Attending: Cardiology | Admitting: Cardiology

## 2020-06-21 VITALS — BP 122/70 | HR 62 | Wt 243.0 lb

## 2020-06-21 DIAGNOSIS — I080 Rheumatic disorders of both mitral and aortic valves: Secondary | ICD-10-CM | POA: Diagnosis not present

## 2020-06-21 DIAGNOSIS — M109 Gout, unspecified: Secondary | ICD-10-CM | POA: Diagnosis not present

## 2020-06-21 DIAGNOSIS — I5022 Chronic systolic (congestive) heart failure: Secondary | ICD-10-CM

## 2020-06-21 DIAGNOSIS — Z87891 Personal history of nicotine dependence: Secondary | ICD-10-CM | POA: Diagnosis not present

## 2020-06-21 DIAGNOSIS — E785 Hyperlipidemia, unspecified: Secondary | ICD-10-CM | POA: Diagnosis not present

## 2020-06-21 DIAGNOSIS — I132 Hypertensive heart and chronic kidney disease with heart failure and with stage 5 chronic kidney disease, or end stage renal disease: Secondary | ICD-10-CM | POA: Diagnosis not present

## 2020-06-21 DIAGNOSIS — Z7901 Long term (current) use of anticoagulants: Secondary | ICD-10-CM | POA: Insufficient documentation

## 2020-06-21 DIAGNOSIS — I482 Chronic atrial fibrillation, unspecified: Secondary | ICD-10-CM

## 2020-06-21 DIAGNOSIS — Z992 Dependence on renal dialysis: Secondary | ICD-10-CM | POA: Insufficient documentation

## 2020-06-21 DIAGNOSIS — Z885 Allergy status to narcotic agent status: Secondary | ICD-10-CM | POA: Insufficient documentation

## 2020-06-21 DIAGNOSIS — I25119 Atherosclerotic heart disease of native coronary artery with unspecified angina pectoris: Secondary | ICD-10-CM | POA: Diagnosis not present

## 2020-06-21 DIAGNOSIS — Z951 Presence of aortocoronary bypass graft: Secondary | ICD-10-CM | POA: Insufficient documentation

## 2020-06-21 DIAGNOSIS — Z79899 Other long term (current) drug therapy: Secondary | ICD-10-CM | POA: Insufficient documentation

## 2020-06-21 DIAGNOSIS — N186 End stage renal disease: Secondary | ICD-10-CM

## 2020-06-21 HISTORY — DX: Heart failure, unspecified: I50.9

## 2020-06-21 LAB — COMPREHENSIVE METABOLIC PANEL
ALT: 20 U/L (ref 0–44)
AST: 21 U/L (ref 15–41)
Albumin: 2.9 g/dL — ABNORMAL LOW (ref 3.5–5.0)
Alkaline Phosphatase: 76 U/L (ref 38–126)
Anion gap: 11 (ref 5–15)
BUN: 29 mg/dL — ABNORMAL HIGH (ref 8–23)
CO2: 29 mmol/L (ref 22–32)
Calcium: 10.6 mg/dL — ABNORMAL HIGH (ref 8.9–10.3)
Chloride: 101 mmol/L (ref 98–111)
Creatinine, Ser: 7.87 mg/dL — ABNORMAL HIGH (ref 0.61–1.24)
GFR, Estimated: 7 mL/min — ABNORMAL LOW (ref >60)
Glucose, Bld: 85 mg/dL (ref 70–99)
Potassium: 4.5 mmol/L (ref 3.5–5.1)
Sodium: 141 mmol/L (ref 135–145)
Total Bilirubin: 1 mg/dL (ref 0.3–1.2)
Total Protein: 5.4 g/dL — ABNORMAL LOW (ref 6.5–8.1)

## 2020-06-21 LAB — TSH: TSH: 3.831 u[IU]/mL (ref 0.350–4.500)

## 2020-06-21 NOTE — Progress Notes (Signed)
Medication Samples have been provided to the patient.  Drug name: Eliquis       Strength: 5 mg        Qty: 2  LOT: [id number]  Exp.Date: 8/23  Dosing instructions: take 1 tab Twice daily   The patient has been instructed regarding the correct time, dose, and frequency of taking this medication, including desired effects and most common side effects.   Alex Williams 11:22 AM 06/21/2020

## 2020-06-21 NOTE — Patient Instructions (Signed)
Labs done today, your results will be available in MyChart, we will contact you for abnormal readings.  Your physician recommends that you schedule a follow-up appointment in: 3 months with echocardiogram  If you have any questions or concerns before your next appointment please send [place] a message through [place] or call our office at [phone].    TO LEAVE A MESSAGE FOR THE NURSE SELECT OPTION 2, PLEASE LEAVE A MESSAGE INCLUDING:  YOUR NAME  DATE OF BIRTH  CALL BACK NUMBER  REASON FOR CALL**this is important as we prioritize the call backs  YOU WILL RECEIVE A CALL BACK THE SAME DAY AS LONG AS YOU CALL BEFORE 4:00 PM  At the [facility], you and your health needs are our priority. As part of our continuing mission to provide you with exceptional heart care, we have created designated Provider Care Teams. These Care Teams include your primary Cardiologist (physician) and Advanced Practice Providers (APPs- Physician Assistants and Nurse Practitioners) who all work together to provide you with the care you need, when you need it.   You may see any of the following providers on your designated Care Team at your next follow up:  Dr Glori Bickers  Dr Haynes Kerns, NP  Lyda Jester, [place]  Audry Riles, PharmD   Please be sure to bring in all your medications bottles to every appointment.

## 2020-06-21 NOTE — Progress Notes (Signed)
Advanced Heart Failure Clinic Note   Referring Physician: PCP: [name], MD (Inactive) HF Cardiology: Dr. Aundra Dubin  HPI: 69 y.o. with history of ESRD (started on HD in 2020), gout, HTN, hyperlipidemia was admitted to [facility] 04/16/19 for chest pain and palpitations.  On arrival to the ED, he was noted to be in atypical atrial flutter in the 130s-140s with acute anterior infarction. CODE STEMI activated.   He was brought to the cath lab and noted to be hypotensive with SBP in 80s. Norepinephrine was started and Impella was placed.  LHC and RHC were done showing mean RA 15, PA 68/32 mean 45, CI 3.3.  There was 90% distal left main stenosis into the LCx, totally occluded mid LAD after diagonal, 90% mild LCx, 50% ostial RCA, 99% ostial PDA.  POBA to mid LAD but unable to restore flow.   It was suspected that the LAD was a chronic occlusion and the onset of atypical aflutter with RVR drove chest pain and ischemia (via severe distal left main stenosis).  Hs-TnI returned at 1297. Echo showed EF 25% range with apical and peri-apical severe hypokinesis.   Patient was taken to the OR for urgent CABGx 3 by Dr. Kipp Brood. Unable to graft LAD, had LIMA-D2, SVG-OM2, and SVG-PDA. He required dobutamine and NE post op and was eventually weaned off and Impella removed. Afib/flutter treated w/ amiodarone and converted to NSR. Was placed on Eliquis for anticoagulation. Required CVVH for volume removal. Later transitioned to HD. Hypotension limited aggressive HF therapy. Was placed on midodrine to support BP during HD. Once stable, he was transferred to SNF for rehab. He is now back at home.   Echo 09/2019 with EF 35-40% with moderate-severe eccentric MR and mild AS. TEE 10/08/19 assess mitral regurgitation more closely showed EF 35%, no mitral stenosis, moderate MR with restricted posterior leaflet, mild AS with mean gradient 18   Admitted on 12/28/19 with RLE cellulitis. Placed on antibiotics. Had lower dopplers  negative for DVT. Discharged on doxycycline.   He had cath in 7/21 with no interventional target.   He returns for followup of CHF and CAD.  He is in NSR today with no palpitations. Mild dyspnea with a long walk, carrying laundary basket, or walking up a flight of stairs.  He is taking midodrine pre-cath, and BP has been stable.  Not lightheaded.  Not having chest pain now.   Labs (11/20): LDL 60, HDL 31 Labs (7/21): LDL 63  ECG (personally reviewed): NSR, 1st degree AVB, poor RWP  PMH: 1. ESRD: MWF HD. 2. Gout 3. HTN 4. Hyperlipidemia 5. Anemia of renal disease.  6. Atypical atrial flutter: Noted 9/20.  7. Atrial fibrillation: Paroxysmal.  Noted after CABG in 9/20.  8. Chronic systolic CHF: Ischemic cardiomyopathy.   - Echo (9/20): EF 30%, akinesis of the distal 1/2-2/3 of LV.  - Echo (3/21): EF 35-40%, peri-apical akinesis, moderate-severe eccentric MR, mild AS with mean gradient 18 mmHg.  - TEE (3/21): EF 35%, mildly decreased RV systolic function, moderate MR with posterior leaflet restriction, mild AS mean gradient 18 mmHg.  9. CAD: LHC in 9/20 showed 90% dLM, totally occluded mLAD (probably CTO), 90% mid LCx, 99% PDA.  Attempted POBA LAD but unable to restore flow.  - CABG (9/20): SVG-PDA, SVG-OM2, LIMA-D2.  No good LAD target.  - LHC (7/21): 95% distal LM, totally occluded LAD, patent LIMA-D2, totally occluded LCx, patent SVG-PLOM, PLOM distal to SVG touchdown with subtotal occlusion, 90% proximal PDA stenosis, patent  SVG-PDA, diffuse moderate disease throughout PLV.  10. Aortic stenosis: Mild on 3/21 echo.  11. Mitral regurgitation: Moderate-severe on 3/21 echo.   Review of Systems: All systems reviewed and negative except as per HPI.   Current Outpatient Medications  Medication Sig Dispense Refill  . acetaminophen (TYLENOL) 500 MG tablet Take 1,000 mg by mouth daily as needed for mild pain.    Marland Kitchen allopurinol (ZYLOPRIM) 100 MG tablet Take 1 tablet (100 mg total) by mouth  daily. 30 tablet 0  . ALPRAZolam (XANAX) 0.25 MG tablet Take 1 tablet (0.25 mg total) by mouth at bedtime as needed for anxiety or sleep (Nights before dialysis). 20 tablet 0  . amiodarone (PACERONE) 200 MG tablet TAKE 1 TABLET BY MOUTH EVERY DAY 90 tablet 3  . bisoprolol (ZEBETA) 5 MG tablet Take 1 tablet as directed on non dialysis tues,thurs,sat,sun (Patient taking differently: Take 5 mg by mouth See admin instructions. Take 1 tablet as directed on non dialysis tues,thurs,sat,sun) 90 tablet 0  . cetirizine (ZYRTEC) 10 MG tablet Take 10 mg by mouth daily as needed for allergies.    . cinacalcet (SENSIPAR) 90 MG tablet Take 90 mg by mouth daily.    . diphenhydramine-acetaminophen (TYLENOL PM) 25-500 MG TABS tablet Take 1 tablet by mouth at bedtime as needed (sleep). TAKES AT DIALYSIS FROM TIME TO TIME    . ELIQUIS 5 MG TABS tablet TAKE 1 TABLET BY MOUTH TWICE A DAY 180 tablet 3  . ferric citrate (AURYXIA) 1 GM 210 MG(Fe) tablet Take 210 mg by mouth 3 (three) times daily with meals.     . fluticasone (FLONASE) 50 MCG/ACT nasal spray Place 1 spray into both nostrils daily as needed for allergies or rhinitis.    Marland Kitchen gabapentin (NEURONTIN) 100 MG capsule TAKE 1 CAPSULE AT BEDTIME ON HD DAYS (M,W,F) (Patient taking differently: Take 100 mg by mouth every Monday, Wednesday, and Friday. Take on dialysis days) 30 capsule 2  . meclizine (ANTIVERT) 12.5 MG tablet Take 1 tablet (12.5 mg total) by mouth 3 (three) times daily as needed for dizziness. 30 tablet 0  . Methoxy PEG-Epoetin Beta (MIRCERA IJ) Mircera    . midodrine (PROAMATINE) 10 MG tablet Take 10 mg by mouth daily.    . Multiple Vitamins-Minerals (CENTRUM SILVER 50+MEN) TABS Take 1 tablet by mouth daily.    . multivitamin (RENA-VIT) TABS tablet Take 1 tablet by mouth at bedtime. 30 tablet 0  . nitroGLYCERIN (NITROSTAT) 0.4 MG SL tablet Place 1 tablet (0.4 mg total) under the tongue every 5 (five) minutes as needed for chest pain. 30 tablet 0  .  ondansetron (ZOFRAN) 4 MG tablet Take 1 tablet (4 mg total) by mouth every 8 (eight) hours as needed for nausea or vomiting. 20 tablet 0  . pantoprazole (PROTONIX) 40 MG tablet TAKE 1 TABLET (40 MG TOTAL) BY MOUTH DAILY AS NEEDED (ACID REFLUX). 90 tablet 1  . promethazine (PHENERGAN) 12.5 MG tablet Take 1 tablet (12.5 mg total) by mouth every 8 (eight) hours as needed for nausea or vomiting. 20 tablet 5  . rosuvastatin (CRESTOR) 10 MG tablet Take 1 tablet (10 mg total) by mouth daily. 90 tablet 3  . sertraline (ZOLOFT) 50 MG tablet Take 50 mg by mouth daily.    Marland Kitchen triamcinolone cream (KENALOG) 0.1 % Apply 1 application topically 2 (two) times daily as needed. 45 g 0   No current facility-administered medications for this encounter.    Allergies  Allergen Reactions  . Codeine Phosphate  Other (See Comments)    Hyperactive       Social History   Socioeconomic History  . Marital status: Legally Separated    Spouse name: Not on file  . Number of children: 2  . Years of education: Not on file  . Highest education level: Not on file  Occupational History  . Not on file  Tobacco Use  . Smoking status: Former Smoker    Types: Cigarettes    Quit date: 07/23/1980    Years since quitting: 39.9  . Smokeless tobacco: Never Used  Vaping Use  . Vaping Use: Never used  Substance and Sexual Activity  . Alcohol use: Yes    Alcohol/week: 1.0 standard drink    Types: 1 Cans of beer per week    Comment: rare  . Drug use: Never  . Sexual activity: Not Currently  Other Topics Concern  . Not on file  Social History Narrative   Pt lives alone. He has one son who lives within 3 miles who he usually sees daily. He has two sisters living in the next town over who he also sees on a weekly basis. He is a religious man who attends church at least 3 Sundays each month.   He operates [employer] full time.    Social Determinants of Health   Financial Resource Strain:   . Difficulty of Paying Living  Expenses: Not on file  Food Insecurity:   . Worried About Charity fundraiser in the Last Year: Not on file  . Ran Out of Food in the Last Year: Not on file  Transportation Needs:   . Lack of Transportation (Medical): Not on file  . Lack of Transportation (Non-Medical): Not on file  Physical Activity:   . Days of Exercise per Week: Not on file  . Minutes of Exercise per Session: Not on file  Stress:   . Feeling of Stress : Not on file  Social Connections:   . Frequency of Communication with Friends and Family: Not on file  . Frequency of Social Gatherings with Friends and Family: Not on file  . Attends Religious Services: Not on file  . Active Member of Clubs or Organizations: Not on file  . Attends Archivist Meetings: Not on file  . Marital Status: Not on file  Intimate Partner Violence:   . Fear of Current or Ex-Partner: Not on file  . Emotionally Abused: Not on file  . Physically Abused: Not on file  . Sexually Abused: Not on file      Family History  Problem Relation Age of Onset  . Hypertension Other   . Diabetes Sister   . Hyperlipidemia Sister   . Sleep apnea Sister     Vitals:   06/21/20 1045  BP: 122/70  Pulse: 62  SpO2: 100%  Weight: 110.2 kg (243 lb)   PHYSICAL EXAM: General: NAD Neck: No JVD, no thyromegaly or thyroid nodule.  Lungs: Clear to auscultation bilaterally with normal respiratory effort. CV: Nondisplaced PMI.  Heart regular S1/S2, no S3/S4, 2/6 SEM RUSB, 2/6 HSM LLSB/apex.  Trace ankle edema.  No carotid bruit.  Normal pedal pulses.  Abdomen: Soft, nontender, no hepatosplenomegaly, no distention.  Skin: Intact without lesions or rashes.  Neurologic: Alert and oriented x 3.  Psych: Normal affect. Extremities: No clubbing or cyanosis.  HEENT: Normal.   ASSESSMENT & PLAN:  1. CAD: Cardiogenic shock in setting of atrial flutter with RVR in 03/2019.  Probable CTO mid  LAD with extensive disease in other arteries. Patient taken  urgently for CABG supported by Impella. 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 Kipp Brood (unable to graft LAD,  LIMA-D2, SVG-OM2, SVG-PDA).  LHC in 7/21 showed patent grafts but PLV territory and PLOM territory beyond SVG touchdown were possible sources of ischemia/angina.  No good interventional option.  However, recently he has not had chest pain.   - Continue ASA 81.  - Continue  Crestor 10 mg daily, good LDL in 7/21.  2. Chronic systolic CHF: Echo with EF in 30% range with peri-apical severe akinesis in 9/20. Ischemic cardiomyopathy. Now s/p CABG. TEE in 3/21 with EF 35%.   NYHA class II-III symptoms, not volume overloaded on exam.  - Would hold off on ICD with borderline EF and uncertain utility in dialysis patient.  - Volume managed with dialysis.  - Continue bisoprolol 5 mg on non-HD days. - Continue midodrine pre-HD.  - Do not think BP will tolerate additional GDMT given trouble with BP at HD.    3. Atrial fibrillation/flutter: NSR today.  - Continue amio 200 mg daily, check LFTs and TSH today.  He will need a regular eye exam while on amiodarone.  - Continue eliquis 5 mg twice a day.   4. ESRD: HD MWF. - To be evaluated for transplant at [facility].  5. Aortic stenosis: Mild on TEE 10/08/19   6. Mitral regurgitation: Moderate to severe eccentric MR on 3/21 TTE.  TEE in 3/21 was suggestive of moderate MR from posterior leaflet restriction.   - Do not think MR is severe enough for Mitraclip.   Followup 3 months with repeat echo.   Loralie Champagne, MD 06/21/20

## 2020-06-23 ENCOUNTER — Encounter: Payer: Medicare Other | Admitting: Family Medicine

## 2020-06-23 NOTE — Telephone Encounter (Signed)
Patient was given enough samples to last him until January. Will seek BMS assistance next year, once 3% OOP has been met.  Charlann Boxer, CPhT

## 2020-06-24 ENCOUNTER — Encounter (HOSPITAL_COMMUNITY): Payer: Medicare Other | Admitting: Cardiology

## 2020-07-11 ENCOUNTER — Telehealth (INDEPENDENT_AMBULATORY_CARE_PROVIDER_SITE_OTHER): Payer: Medicare Other | Admitting: Family Medicine

## 2020-07-11 ENCOUNTER — Encounter: Payer: Self-pay | Admitting: Family Medicine

## 2020-07-11 ENCOUNTER — Other Ambulatory Visit: Payer: Self-pay

## 2020-07-11 VITALS — Temp 98.4°F

## 2020-07-11 DIAGNOSIS — R059 Cough, unspecified: Secondary | ICD-10-CM | POA: Diagnosis not present

## 2020-07-11 MED ORDER — ALBUTEROL SULFATE HFA 108 (90 BASE) MCG/ACT IN AERS: 2.0000 | INHALATION_SPRAY | Freq: Four times a day (QID) | RESPIRATORY_TRACT | 2 refills | Status: DC | PRN

## 2020-07-11 MED ORDER — PREDNISONE 20 MG PO TABS: 20.0000 mg | ORAL_TABLET | Freq: Every day | ORAL | 0 refills | Status: DC

## 2020-07-11 MED ORDER — BENZONATATE 100 MG PO CAPS: 100.0000 mg | ORAL_CAPSULE | Freq: Two times a day (BID) | ORAL | 0 refills | Status: DC | PRN

## 2020-07-11 MED ORDER — MUCINEX DM MAXIMUM STRENGTH 60-1200 MG PO TB12: 1.0000 | ORAL_TABLET | Freq: Two times a day (BID) | ORAL | 0 refills | Status: DC

## 2020-07-11 NOTE — Progress Notes (Signed)
Virtual Visit Note  I connected with patient on 07/11/20 at 1030 by telephone due to unable to work Epic video visit and verified that I am speaking with the correct person using two identifiers. Alex Williams is currently located at home and no family members are currently with them during visit. The provider, Laurita Quint Kenyan Karnes, FNP is located in their office at time of visit.  I discussed the limitations, risks, security and privacy concerns of performing an evaluation and management service by telephone and the availability of in person appointments. I also discussed with the patient that there may be a patient responsible charge related to this service. The patient expressed understanding and agreed to proceed.   I provided 20 minutes of non-face-to-face time during this encounter.  Chief Complaint  Patient presents with  . Cough    Chills/ wheezing , coughing Since Saturday night , congestion yellow / white phlegm, otc not working    HPI  Started on Friday night? Taking cough drops for his sx. UTD on flu and covid vaccinations There was someone sick at dialysis on Wed. He was not able to go to dialysis today due to illness Does not ofen get sick   Allergies  Allergen Reactions  . Codeine Phosphate Other (See Comments)    Hyperactive     Prior to Admission medications   Medication Sig Start Date End Date Taking? Authorizing Provider  acetaminophen (TYLENOL) 500 MG tablet Take 1,000 mg by mouth daily as needed for mild pain.   Yes [provider]  allopurinol (ZYLOPRIM) 100 MG tablet Take 1 tablet (100 mg total) by mouth daily. 05/08/19  Yes Angiulli, Lavon Paganini, PA-C  ALPRAZolam Duanne Moron) 0.25 MG tablet Take 1 tablet (0.25 mg total) by mouth at bedtime as needed for anxiety or sleep (Nights before dialysis). 06/07/20  Yes Kysen Wetherington, Laurita Quint, FNP  amiodarone (PACERONE) 200 MG tablet TAKE 1 TABLET BY MOUTH EVERY DAY 01/26/20  Yes Larey Dresser, MD  bisoprolol (ZEBETA) 5 MG  tablet Take 1 tablet as directed on non dialysis tues,thurs,sat,sun Patient taking differently: Take 5 mg by mouth See admin instructions. Take 1 tablet as directed on non dialysis tues,thurs,sat,sun 02/22/20  Yes Larey Dresser, MD  cetirizine (ZYRTEC) 10 MG tablet Take 10 mg by mouth daily as needed for allergies.   Yes [provider]  cinacalcet (SENSIPAR) 90 MG tablet Take 90 mg by mouth daily.   Yes [provider]  diphenhydramine-acetaminophen (TYLENOL PM) 25-500 MG TABS tablet Take 1 tablet by mouth at bedtime as needed (sleep). TAKES AT DIALYSIS FROM TIME TO TIME   Yes [provider]  ELIQUIS 5 MG TABS tablet TAKE 1 TABLET BY MOUTH TWICE A DAY 10/21/19  Yes Larey Dresser, MD  ferric citrate (AURYXIA) 1 GM 210 MG(Fe) tablet Take 210 mg by mouth 3 (three) times daily with meals.    Yes [provider]  fluticasone (FLONASE) 50 MCG/ACT nasal spray Place 1 spray into both nostrils daily as needed for allergies or rhinitis.   Yes [provider]  gabapentin (NEURONTIN) 100 MG capsule TAKE 1 CAPSULE AT BEDTIME ON HD DAYS (M,W,F) Patient taking differently: Take 100 mg by mouth every Monday, Wednesday, and Friday. Take on dialysis days 12/17/19  Yes Jacelyn Pi, Lilia Argue, MD  meclizine (ANTIVERT) 12.5 MG tablet Take 1 tablet (12.5 mg total) by mouth 3 (three) times daily as needed for dizziness. 05/04/20  Yes Sagardia, Ines Bloomer, MD  Methoxy  PEG-Epoetin Beta (MIRCERA IJ) Mircera 02/24/20 02/22/21 Yes [provider]  midodrine (PROAMATINE) 10 MG tablet Take 10 mg by mouth daily.   Yes [provider]  Multiple Vitamins-Minerals (CENTRUM SILVER 50+MEN) TABS Take 1 tablet by mouth daily.   Yes [provider]  multivitamin (RENA-VIT) TABS tablet Take 1 tablet by mouth at bedtime. 05/07/19  Yes Angiulli, Lavon Paganini, PA-C  nitroGLYCERIN (NITROSTAT) 0.4 MG SL tablet Place 1 tablet (0.4 mg total) under the tongue every 5 (five) minutes  as needed for chest pain. 02/07/20  Yes Sheikh, Omair Latif, DO  ondansetron (ZOFRAN) 4 MG tablet Take 1 tablet (4 mg total) by mouth every 8 (eight) hours as needed for nausea or vomiting. 06/07/20  Yes Richelle Glick, Laurita Quint, FNP  pantoprazole (PROTONIX) 40 MG tablet TAKE 1 TABLET (40 MG TOTAL) BY MOUTH DAILY AS NEEDED (ACID REFLUX). 04/21/20  Yes Jacelyn Pi, Lilia Argue, MD  promethazine (PHENERGAN) 12.5 MG tablet Take 1 tablet (12.5 mg total) by mouth every 8 (eight) hours as needed for nausea or vomiting. 03/03/20  Yes Jacelyn Pi, Lilia Argue, MD  sertraline (ZOLOFT) 50 MG tablet Take 50 mg by mouth daily.   Yes [provider]  triamcinolone cream (KENALOG) 0.1 % Apply 1 application topically 2 (two) times daily as needed. 02/16/20  Yes Jacelyn Pi, Lilia Argue, MD  rosuvastatin (CRESTOR) 10 MG tablet Take 1 tablet (10 mg total) by mouth daily. 09/29/19 06/21/20  Larey Dresser, MD    Past Medical History:  Diagnosis Date  . Cervical disc disease   . CHF (congestive heart failure) ([place])   . Chronic kidney disease   . COLONIC POLYPS, HX OF 06/27/2007  . EXOGENOUS OBESITY 01/30/2010  . GERD (gastroesophageal reflux disease)    PMH  . GOUT 01/30/2010  . HYPERCHOLESTEROLEMIA 06/30/2007  . HYPERTENSION 06/27/2007  . LOW BACK PAIN 06/27/2007  . NEPHROLITHIASIS, HX OF 06/27/2007  . OSTEOARTHRITIS 06/27/2007  . SLEEP APNEA, OBSTRUCTIVE, MODERATE 01/27/2009  . Wears dentures    upper    Past Surgical History:  Procedure Laterality Date  . AV FISTULA PLACEMENT Right 01/19/2019   Procedure: RIGHT BRACHIOCEPHALIC ARTERIOVENOUS (AV) FISTULA CREATION;  Surgeon: Angelia Mould, MD;  Location: [place];  Service: Vascular;  Laterality: Right;  . CARDIAC CATHETERIZATION    . CARPAL TUNNEL RELEASE     left hand  . COLONOSCOPY     with polypectomy  . CORONARY ARTERY BYPASS GRAFT N/A 04/16/2019   Procedure: CORONARY ARTERY BYPASS GRAFTING (CABG)X3  , WITH ENDOSCOPIC HARVESTING OF RIGHT GREATER SAPHENOUS  VEIN;  Surgeon: Lajuana Matte, MD;  Location: [place];  Service: Open Heart Surgery;  Laterality: N/A;  . CORONARY/GRAFT ACUTE MI REVASCULARIZATION N/A 04/16/2019   Procedure: CORONARY/GRAFT ACUTE MI REVASCULARIZATION;  Surgeon: Burnell Blanks, MD;  Location: [place] [facility];  Service: Cardiovascular;  Laterality: N/A;  . INSERTION OF DIALYSIS CATHETER N/A 04/29/2019   Procedure: INSERTION OF TUNNELED DIALYSIS CATHETER, right internal jugular;  Surgeon: Rosetta Posner, MD;  Location: [place];  Service: Vascular;  Laterality: N/A;  . IR US GUIDE BX ASP/DRAIN  12/29/2019  . JOINT REPLACEMENT Left 2005   knee  . LEFT HEART CATH AND CORS/GRAFTS ANGIOGRAPHY N/A 02/05/2020   Procedure: LEFT HEART CATH AND CORS/GRAFTS ANGIOGRAPHY;  Surgeon: Larey Dresser, MD;  Location: [place] [facility];  Service: Cardiovascular;  Laterality: N/A;  . LUMBAR LAMINECTOMY    . MULTIPLE TOOTH EXTRACTIONS    . PERIPHERAL  VASCULAR BALLOON ANGIOPLASTY Right 06/25/2019   Procedure: PERIPHERAL VASCULAR BALLOON ANGIOPLASTY;  Surgeon: Marty Heck, MD;  Location: [place] [facility];  Service: Cardiovascular;  Laterality: Right;  . RADIOFREQUENCY ABLATION KIDNEY    . RIGHT/LEFT HEART CATH AND CORONARY ANGIOGRAPHY N/A 04/16/2019   Procedure: RIGHT/LEFT HEART CATH AND CORONARY ANGIOGRAPHY;  Surgeon: Burnell Blanks, MD;  Location: [place] [facility];  Service: Cardiovascular;  Laterality: N/A;  . TEE WITHOUT CARDIOVERSION N/A 04/16/2019   Procedure: TRANSESOPHAGEAL ECHOCARDIOGRAM (TEE);  Surgeon: Lajuana Matte, MD;  Location: [place];  Service: Open Heart Surgery;  Laterality: N/A;  . TEE WITHOUT CARDIOVERSION N/A 10/08/2019   Procedure: TRANSESOPHAGEAL ECHOCARDIOGRAM (TEE);  Surgeon: Larey Dresser, MD;  Location: [facility];  Service: Cardiovascular;  Laterality: N/A;  . TOTAL KNEE ARTHROPLASTY     left  . VENTRICULAR ASSIST DEVICE INSERTION N/A 04/16/2019   Procedure: VENTRICULAR ASSIST DEVICE  INSERTION;  Surgeon: Burnell Blanks, MD;  Location: [place] [facility];  Service: Cardiovascular;  Laterality: N/A;    Social History   Tobacco Use  . Smoking status: Former Smoker    Types: Cigarettes    Quit date: 07/23/1980    Years since quitting: 39.9  . Smokeless tobacco: Never Used  Substance Use Topics  . Alcohol use: Yes    Alcohol/week: 1.0 standard drink    Types: 1 Cans of beer per week    Comment: rare    Family History  Problem Relation Age of Onset  . Hypertension Other   . Diabetes Sister   . Hyperlipidemia Sister   . Sleep apnea Sister     Review of Systems  Constitutional: Positive for chills and malaise/fatigue. Negative for fever.  HENT: Positive for congestion. Negative for ear pain, sinus pain and sore throat.   Respiratory: Positive for cough, sputum production (yellow, clear) and wheezing. Negative for shortness of breath.   Cardiovascular: Negative for chest pain and palpitations.  Gastrointestinal: Negative for constipation, diarrhea, nausea and vomiting.  Musculoskeletal: Positive for myalgias.    Objective  Constitutional:      General: She is not in acute distress.    Appearance: Normal appearance. She is not ill-appearing.   Pulmonary:     Effort: Pulmonary effort is normal. No respiratory distress.  Neurological:     Mental Status: She is alert and oriented to person, place, and time.  Psychiatric:        Mood and Affect: Mood normal.        Behavior: Behavior normal.     ASSESSMENT and PLAN  Problem List Items Addressed This Visit   None   Visit Diagnoses    Cough    -  Primary   Relevant Medications   albuterol (VENTOLIN HFA) 108 (90 Base) MCG/ACT inhaler: prn   predniSONE (DELTASONE) 20 MG tablet Take 2 tablets day one and one each after that   benzonatate (TESSALON) 100 MG capsule: Take 2nd line   Dextromethorphan-guaiFENesin (MUCINEX DM MAXIMUM STRENGTH) 60-1200 MG TB12 Take twice per day  Strict RTC/ED  precautions provided Continue to use supportive treatments: Increase fluids, rest       Return if symptoms worsen or fail to improve, for next scheduled appointment.    The above assessment and management plan was discussed with the patient. The patient verbalized understanding of and has agreed to the management plan. Patient is aware to call the clinic if symptoms persist or worsen. Patient is aware when to return to the  clinic for a follow-up visit. Patient educated on when it is appropriate to go to the emergency department.     Huston Foley Ezrah Dembeck, FNP-BC [facility] [place], [place] [address] Ph.  [phone] Fax [phone]

## 2020-07-11 NOTE — Patient Instructions (Signed)
Viral Respiratory Infection A viral respiratory infection is an illness that affects parts of the body that are used for breathing. These include the lungs, nose, and throat. It is caused by a germ called a virus. Some examples of this kind of infection are:  A cold.  The flu (influenza).  A respiratory syncytial virus (RSV) infection. A person who gets this illness may have the following symptoms:  A stuffy or runny nose.  Yellow or green fluid in the nose.  A cough.  Sneezing.  Tiredness (fatigue).  Achy muscles.  A sore throat.  Sweating or chills.  A fever.  A headache. Follow these instructions at home: Managing pain and congestion  Take over-the-counter and prescription medicines only as told by your doctor.  If you have a sore throat, gargle with salt water. Do this 3-4 times per day or as needed. To make a salt-water mixture, dissolve -1 tsp of salt in 1 cup of warm water. Make sure that all the salt dissolves.  Use nose drops made from salt water. This helps with stuffiness (congestion). It also helps soften the skin around your nose.  Drink enough fluid to keep your pee (urine) pale yellow. General instructions   Rest as much as possible.  Do not drink alcohol.  Do not use any products that have nicotine or tobacco, such as cigarettes and e-cigarettes. If you need help quitting, ask your doctor.  Keep all follow-up visits as told by your doctor. This is important. How is this prevented?   Get a flu shot every year. Ask your doctor when you should get your flu shot.  Do not let other people get your germs. If you are sick: ? Stay home from work or school. ? Wash your hands with soap and water often. Wash your hands after you cough or sneeze. If soap and water are not available, use hand sanitizer.  Avoid contact with people who are sick during cold and flu season. This is in fall and winter. Get help if:  Your symptoms last for 10 days or  longer.  Your symptoms get worse over time.  You have a fever.  You have very bad pain in your face or forehead.  Parts of your jaw or neck become very swollen. Get help right away if:  You feel pain or pressure in your chest.  You have shortness of breath.  You faint or feel like you will faint.  You keep throwing up (vomiting).  You feel confused. Summary  A viral respiratory infection is an illness that affects parts of the body that are used for breathing.  Examples of this illness include a cold, the flu, and respiratory syncytial virus (RSV) infection.  The infection can cause a runny nose, cough, sneezing, sore throat, and fever.  Follow what your doctor tells you about taking medicines, drinking lots of fluid, washing your hands, resting at home, and avoiding people who are sick. This information is not intended to replace advice given to you by your health care provider. Make sure you discuss any questions you have with your health care provider. Document Revised: 07/17/2018 Document Reviewed: 08/19/2017 Elsevier Patient Education  2020 [employer].

## 2020-07-19 ENCOUNTER — Other Ambulatory Visit: Payer: Self-pay

## 2020-07-19 ENCOUNTER — Encounter: Payer: Self-pay | Admitting: Family Medicine

## 2020-07-19 ENCOUNTER — Ambulatory Visit (INDEPENDENT_AMBULATORY_CARE_PROVIDER_SITE_OTHER): Payer: Medicare Other | Admitting: Family Medicine

## 2020-07-19 ENCOUNTER — Ambulatory Visit (INDEPENDENT_AMBULATORY_CARE_PROVIDER_SITE_OTHER): Payer: Medicare Other

## 2020-07-19 VITALS — BP 121/75 | HR 67 | Temp 98.0°F | Ht 72.0 in | Wt 229.0 lb

## 2020-07-19 DIAGNOSIS — J189 Pneumonia, unspecified organism: Secondary | ICD-10-CM

## 2020-07-19 DIAGNOSIS — R059 Cough, unspecified: Secondary | ICD-10-CM | POA: Diagnosis not present

## 2020-07-19 LAB — CBC WITH DIFFERENTIAL/PLATELET
Basophils Absolute: 0 10*3/uL (ref 0.0–0.2)
Basos: 1 %
EOS (ABSOLUTE): 0.1 10*3/uL (ref 0.0–0.4)
Eos: 2 %
Hematocrit: 33.8 % — ABNORMAL LOW (ref 37.5–51.0)
Hemoglobin: 11.2 g/dL — ABNORMAL LOW (ref 13.0–17.7)
Immature Grans (Abs): 0 10*3/uL (ref 0.0–0.1)
Immature Granulocytes: 0 %
Lymphocytes Absolute: 1.3 10*3/uL (ref 0.7–3.1)
Lymphs: 20 %
MCH: 28.9 pg (ref 26.6–33.0)
MCHC: 33.1 g/dL (ref 31.5–35.7)
MCV: 87 fL (ref 79–97)
Monocytes Absolute: 0.5 10*3/uL (ref 0.1–0.9)
Monocytes: 8 %
Neutrophils Absolute: 4.6 10*3/uL (ref 1.4–7.0)
Neutrophils: 69 %
Platelets: 252 10*3/uL (ref 150–450)
RBC: 3.87 x10E6/uL — ABNORMAL LOW (ref 4.14–5.80)
RDW: 14.5 % (ref 11.6–15.4)
WBC: 6.6 10*3/uL (ref 3.4–10.8)

## 2020-07-19 MED ORDER — GUAIFENESIN 200 MG PO TABS: 200.0000 mg | ORAL_TABLET | ORAL | 3 refills | Status: DC | PRN

## 2020-07-19 MED ORDER — MOXIFLOXACIN HCL 400 MG PO TABS: 400.0000 mg | ORAL_TABLET | Freq: Every day | ORAL | 0 refills | Status: DC

## 2020-07-19 MED ORDER — BENZONATATE 100 MG PO CAPS: 100.0000 mg | ORAL_CAPSULE | Freq: Two times a day (BID) | ORAL | 1 refills | Status: DC | PRN

## 2020-07-19 NOTE — Patient Instructions (Addendum)
Cough, Adult A cough helps to clear your throat and lungs. A cough may be a sign of an illness or another medical condition. An acute cough may only last 2-3 weeks, while a chronic cough may last 8 or more weeks. Many things can cause a cough. They include:  Germs (viruses or bacteria) that attack the airway.  Breathing in things that bother (irritate) your lungs.  Allergies.  Asthma.  Mucus that runs down the back of your throat (postnasal drip).  Smoking.  Acid backing up from the stomach into the tube that moves food from the mouth to the stomach (gastroesophageal reflux).  Some medicines.  Lung problems.  Other medical conditions, such as heart failure or a blood clot in the lung (pulmonary embolism). Follow these instructions at home: Medicines  Take over-the-counter and prescription medicines only as told by your doctor.  Talk with your doctor before you take medicines that stop a cough (coughsuppressants). Lifestyle   Do not smoke, and try not to be around smoke. Do not use any products that contain nicotine or tobacco, such as cigarettes, e-cigarettes, and chewing tobacco. If you need help quitting, ask your doctor.  Drink enough fluid to keep your pee (urine) pale yellow.  Avoid caffeine.  Do not drink alcohol if your doctor tells you not to drink. General instructions   Watch for any changes in your cough. Tell your doctor about them.  Always cover your mouth when you cough.  Stay away from things that make you cough, such as perfume, candles, campfire smoke, or cleaning products.  If the air is dry, use a cool mist vaporizer or humidifier in your home.  If your cough is worse at night, try using extra pillows to raise your head up higher while you sleep.  Rest as needed.  Keep all follow-up visits as told by your doctor. This is important. Contact a doctor if:  You have new symptoms.  You cough up pus.  Your cough does not get better after  2-3 weeks, or your cough gets worse.  Cough medicine does not help your cough and you are not sleeping well.  You have pain that gets worse or pain that is not helped with medicine.  You have a fever.  You are losing weight and you do not know why.  You have night sweats. Get help right away if:  You cough up blood.  You have trouble breathing.  Your heartbeat is very fast. These symptoms may be an emergency. Do not wait to see if the symptoms will go away. Get medical help right away. Call your local emergency services (911 in the [place] Do not drive yourself to the hospital. Summary  A cough helps to clear your throat and lungs. Many things can cause a cough.  Take over-the-counter and prescription medicines only as told by your doctor.  Always cover your mouth when you cough.  Contact a doctor if you have new symptoms or you have a cough that does not get better or gets worse. This information is not intended to replace advice given to you by your health care provider. Make sure you discuss any questions you have with your health care provider. Document Revised: 07/28/2018 Document Reviewed: 07/28/2018 Elsevier Patient Education  [employer].    If you have lab work done today you will be contacted with your lab results within the next 2 weeks.  If you have not heard from [place] then please contact us. The  fastest way to get your results is to register for My Chart.   IF you received an x-ray today, you will receive an invoice from [facility]. Please contact [facility] at [phone] with questions or concerns regarding your invoice.   IF you received labwork today, you will receive an invoice from [facility]. Please contact [facility] at [phone] with questions or concerns regarding your invoice.   Our billing staff will not be able to assist you with questions regarding bills from these companies.  You will be contacted with the lab results as  soon as they are available. The fastest way to get your results is to activate your My Chart account. Instructions are located on the last page of this paperwork. If you have not heard from [place] regarding the results in 2 weeks, please contact this office.

## 2020-07-19 NOTE — Progress Notes (Signed)
12/29/20218:08 AM  Alex Williams January 22, 1951, 69 y.o., male [id number]  Chief Complaint  Patient presents with  . Cough    And chest congestion follow up - states mucinex has caused him to spit up blood     HPI:   Patient is a 69 y.o. male with past medical history significant for HTN, HLD, gout,ESRDon HD, MI s/p 3V CABG, knee OAwho presents today for routine follow-up.  12/20 cough Stopped taking Mucinex due to coughing up blood  Nausea is improved Most days are better than others for mood Loose cough continues Still using albuterol inhaler Some improvement after steroids  Still continuing dialysis M,W, F Taking 3.5-4 liters at a time Most days good Didn't going to dialysis last Monday or Wed  Went yesterday and Friday.   Wt Readings from Last 3 Encounters:  07/19/20 229 lb (103.9 kg)  06/21/20 243 lb (110.2 kg)  06/07/20 226 lb (102.5 kg)     Depression screen [facility] 2/9 06/07/2020 04/26/2020 04/06/2020  Decreased Interest 0 3 0  Down, Depressed, Hopeless 0 2 0  PHQ - 2 Score 0 5 0  Altered sleeping 0 3 -  Tired, decreased energy 0 3 -  Change in appetite 0 1 -  Feeling bad or failure about yourself  0 0 -  Trouble concentrating 0 1 -  Moving slowly or fidgety/restless 0 1 -  Suicidal thoughts 0 0 -  PHQ-9 Score 0 14 -  Difficult doing work/chores - - -  Some recent data might be hidden    Fall Risk  07/19/2020 06/07/2020 05/04/2020 04/06/2020 03/03/2020  Falls in the past year? 0 0 0 1 0  Number falls in past yr: 0 0 0 0 0  Comment - - - - -  Injury with Fall? 0 0 0 0 0  Comment - - - - -  Risk for fall due to : - - - - -  Follow up Falls evaluation completed Falls evaluation completed Falls evaluation completed Falls evaluation completed Falls evaluation completed     Allergies  Allergen Reactions  . Codeine Phosphate Other (See Comments)    Hyperactive     Prior to Admission medications   Medication Sig Start Date End Date Taking? Authorizing  Provider  acetaminophen (TYLENOL) 500 MG tablet Take 1,000 mg by mouth daily as needed for mild pain.   Yes [provider]  albuterol (VENTOLIN HFA) 108 (90 Base) MCG/ACT inhaler Inhale 2 puffs into the lungs every 6 (six) hours as needed for wheezing or shortness of breath. 07/11/20  Yes Lurene Robley, Laurita Quint, FNP  allopurinol (ZYLOPRIM) 100 MG tablet Take 1 tablet (100 mg total) by mouth daily. 05/08/19  Yes Angiulli, Lavon Paganini, PA-C  ALPRAZolam Duanne Moron) 0.25 MG tablet Take 1 tablet (0.25 mg total) by mouth at bedtime as needed for anxiety or sleep (Nights before dialysis). 06/07/20  Yes Derrick Tiegs, Laurita Quint, FNP  amiodarone (PACERONE) 200 MG tablet TAKE 1 TABLET BY MOUTH EVERY DAY 01/26/20  Yes Larey Dresser, MD  benzonatate (TESSALON) 100 MG capsule Take 1-2 capsules (100-200 mg total) by mouth 2 (two) times daily as needed for cough. 07/11/20  Yes Benedicto Capozzi, Laurita Quint, FNP  bisoprolol (ZEBETA) 5 MG tablet Take 1 tablet as directed on non dialysis tues,thurs,sat,sun Patient taking differently: Take 5 mg by mouth See admin instructions. Take 1 tablet as directed on non dialysis tues,thurs,sat,sun 02/22/20  Yes Larey Dresser, MD  cetirizine (ZYRTEC) 10 MG tablet Take 10  mg by mouth daily as needed for allergies.   Yes [provider]  cinacalcet (SENSIPAR) 90 MG tablet Take 90 mg by mouth daily.   Yes [provider]  diphenhydramine-acetaminophen (TYLENOL PM) 25-500 MG TABS tablet Take 1 tablet by mouth at bedtime as needed (sleep). TAKES AT DIALYSIS FROM TIME TO TIME   Yes [provider]  ELIQUIS 5 MG TABS tablet TAKE 1 TABLET BY MOUTH TWICE A DAY 10/21/19  Yes Larey Dresser, MD  ferric citrate (AURYXIA) 1 GM 210 MG(Fe) tablet Take 210 mg by mouth 3 (three) times daily with meals.    Yes [provider]  fluticasone (FLONASE) 50 MCG/ACT nasal spray Place 1 spray into both nostrils daily as needed for allergies or rhinitis.   Yes [provider]  gabapentin  (NEURONTIN) 100 MG capsule TAKE 1 CAPSULE AT BEDTIME ON HD DAYS (M,W,F) Patient taking differently: Take 100 mg by mouth every Monday, Wednesday, and Friday. Take on dialysis days 12/17/19  Yes Jacelyn Pi, Lilia Argue, MD  meclizine (ANTIVERT) 12.5 MG tablet Take 1 tablet (12.5 mg total) by mouth 3 (three) times daily as needed for dizziness. 05/04/20  Yes Sagardia, Ines Bloomer, MD  Methoxy PEG-Epoetin Beta (MIRCERA IJ) Mircera 02/24/20 02/22/21 Yes [provider]  midodrine (PROAMATINE) 10 MG tablet Take 10 mg by mouth daily.   Yes [provider]  Multiple Vitamins-Minerals (CENTRUM SILVER 50+MEN) TABS Take 1 tablet by mouth daily.   Yes [provider]  multivitamin (RENA-VIT) TABS tablet Take 1 tablet by mouth at bedtime. 05/07/19  Yes Angiulli, Lavon Paganini, PA-C  nitroGLYCERIN (NITROSTAT) 0.4 MG SL tablet Place 1 tablet (0.4 mg total) under the tongue every 5 (five) minutes as needed for chest pain. 02/07/20  Yes Sheikh, Omair Latif, DO  ondansetron (ZOFRAN) 4 MG tablet Take 1 tablet (4 mg total) by mouth every 8 (eight) hours as needed for nausea or vomiting. 06/07/20  Yes Angelica Wix, Laurita Quint, FNP  pantoprazole (PROTONIX) 40 MG tablet TAKE 1 TABLET (40 MG TOTAL) BY MOUTH DAILY AS NEEDED (ACID REFLUX). 04/21/20  Yes Jacelyn Pi, Lilia Argue, MD  predniSONE (DELTASONE) 20 MG tablet Take 1 tablet (20 mg total) by mouth daily with breakfast. Take 2 tablets day 1 and one each day after 07/11/20  Yes Chanin Frumkin, Laurita Quint, FNP  promethazine (PHENERGAN) 12.5 MG tablet Take 1 tablet (12.5 mg total) by mouth every 8 (eight) hours as needed for nausea or vomiting. 03/03/20  Yes Jacelyn Pi, Lilia Argue, MD  sertraline (ZOLOFT) 50 MG tablet Take 50 mg by mouth daily.   Yes [provider]  triamcinolone cream (KENALOG) 0.1 % Apply 1 application topically 2 (two) times daily as needed. 02/16/20  Yes Jacelyn Pi, Lilia Argue, MD  Dextromethorphan-guaiFENesin [facility] DM MAXIMUM STRENGTH) 60-1200 MG TB12  Take 1 tablet by mouth every 12 (twelve) hours. Patient not taking: Reported on 07/19/2020 07/11/20   Anhad Sheeley, Laurita Quint, FNP  rosuvastatin (CRESTOR) 10 MG tablet Take 1 tablet (10 mg total) by mouth daily. 09/29/19 06/21/20  Larey Dresser, MD    Past Medical History:  Diagnosis Date  . Cervical disc disease   . CHF (congestive heart failure) ([place])   . Chronic kidney disease   . COLONIC POLYPS, HX OF 06/27/2007  . EXOGENOUS OBESITY 01/30/2010  . GERD (gastroesophageal reflux disease)    PMH  . GOUT 01/30/2010  . HYPERCHOLESTEROLEMIA 06/30/2007  . HYPERTENSION 06/27/2007  . LOW BACK PAIN 06/27/2007  . NEPHROLITHIASIS,  HX OF 06/27/2007  . OSTEOARTHRITIS 06/27/2007  . SLEEP APNEA, OBSTRUCTIVE, MODERATE 01/27/2009  . Wears dentures    upper    Past Surgical History:  Procedure Laterality Date  . AV FISTULA PLACEMENT Right 01/19/2019   Procedure: RIGHT BRACHIOCEPHALIC ARTERIOVENOUS (AV) FISTULA CREATION;  Surgeon: Angelia Mould, MD;  Location: [place];  Service: Vascular;  Laterality: Right;  . CARDIAC CATHETERIZATION    . CARPAL TUNNEL RELEASE     left hand  . COLONOSCOPY     with polypectomy  . CORONARY ARTERY BYPASS GRAFT N/A 04/16/2019   Procedure: CORONARY ARTERY BYPASS GRAFTING (CABG)X3  , WITH ENDOSCOPIC HARVESTING OF RIGHT GREATER SAPHENOUS VEIN;  Surgeon: Lajuana Matte, MD;  Location: [place];  Service: Open Heart Surgery;  Laterality: N/A;  . CORONARY/GRAFT ACUTE MI REVASCULARIZATION N/A 04/16/2019   Procedure: CORONARY/GRAFT ACUTE MI REVASCULARIZATION;  Surgeon: Burnell Blanks, MD;  Location: [place] [facility];  Service: Cardiovascular;  Laterality: N/A;  . INSERTION OF DIALYSIS CATHETER N/A 04/29/2019   Procedure: INSERTION OF TUNNELED DIALYSIS CATHETER, right internal jugular;  Surgeon: Rosetta Posner, MD;  Location: [place];  Service: Vascular;  Laterality: N/A;  . IR US GUIDE BX ASP/DRAIN  12/29/2019  . JOINT REPLACEMENT Left 2005   knee  . LEFT HEART CATH AND  CORS/GRAFTS ANGIOGRAPHY N/A 02/05/2020   Procedure: LEFT HEART CATH AND CORS/GRAFTS ANGIOGRAPHY;  Surgeon: Larey Dresser, MD;  Location: [place] [facility];  Service: Cardiovascular;  Laterality: N/A;  . LUMBAR LAMINECTOMY    . MULTIPLE TOOTH EXTRACTIONS    . PERIPHERAL VASCULAR BALLOON ANGIOPLASTY Right 06/25/2019   Procedure: PERIPHERAL VASCULAR BALLOON ANGIOPLASTY;  Surgeon: Marty Heck, MD;  Location: [place] [facility];  Service: Cardiovascular;  Laterality: Right;  . RADIOFREQUENCY ABLATION KIDNEY    . RIGHT/LEFT HEART CATH AND CORONARY ANGIOGRAPHY N/A 04/16/2019   Procedure: RIGHT/LEFT HEART CATH AND CORONARY ANGIOGRAPHY;  Surgeon: Burnell Blanks, MD;  Location: [place] [facility];  Service: Cardiovascular;  Laterality: N/A;  . TEE WITHOUT CARDIOVERSION N/A 04/16/2019   Procedure: TRANSESOPHAGEAL ECHOCARDIOGRAM (TEE);  Surgeon: Lajuana Matte, MD;  Location: [place];  Service: Open Heart Surgery;  Laterality: N/A;  . TEE WITHOUT CARDIOVERSION N/A 10/08/2019   Procedure: TRANSESOPHAGEAL ECHOCARDIOGRAM (TEE);  Surgeon: Larey Dresser, MD;  [facility];  Service: Cardiovascular;  Laterality: N/A;  . TOTAL KNEE ARTHROPLASTY     left  . VENTRICULAR ASSIST DEVICE INSERTION N/A 04/16/2019   Procedure: VENTRICULAR ASSIST DEVICE INSERTION;  Surgeon: Burnell Blanks, MD;  Location: [place] [facility];  Service: Cardiovascular;  Laterality: N/A;    Social History   Tobacco Use  . Smoking status: Former Smoker    Types: Cigarettes    Quit date: 07/23/1980    Years since quitting: 40.0  . Smokeless tobacco: Never Used  Substance Use Topics  . Alcohol use: Yes    Alcohol/week: 1.0 standard drink    Types: 1 Cans of beer per week    Comment: rare    Family History  Problem Relation Age of Onset  . Hypertension Other   . Diabetes Sister   . Hyperlipidemia Sister   . Sleep apnea Sister     Review of Systems  Constitutional: Negative for chills,  fever and malaise/fatigue.  Eyes: Negative for blurred vision and double vision.  Respiratory: Positive for cough and sputum production (dark yellow). Negative for shortness of breath and wheezing.   Cardiovascular: Negative for chest  pain, palpitations and leg swelling.  Gastrointestinal: Negative for abdominal pain, blood in stool, constipation, diarrhea, heartburn, nausea and vomiting.  Genitourinary: Negative for dysuria, frequency and hematuria.  Musculoskeletal: Negative for back pain and joint pain.  Skin: Negative for rash.  Neurological: Positive for weakness (generalized). Negative for dizziness and headaches.     OBJECTIVE:  Today's Vitals   07/19/20 1134  BP: 121/75  Pulse: 67  Temp: 98 F (36.7 C)  SpO2: 99%  Weight: 229 lb (103.9 kg)  Height: 6' (1.829 m)   Body mass index is 31.06 kg/m.   Physical Exam Vitals reviewed.  Constitutional:      Appearance: Normal appearance.  HENT:     Head: Normocephalic and atraumatic.  Eyes:     Conjunctiva/sclera: Conjunctivae normal.     Pupils: Pupils are equal, round, and reactive to light.  Cardiovascular:     Rate and Rhythm: Normal rate. Rhythm irregular.     Pulses: Normal pulses.     Heart sounds: Normal heart sounds. No murmur heard. No friction rub. No gallop.   Pulmonary:     Effort: Pulmonary effort is normal. No respiratory distress.     Breath sounds: Normal breath sounds. No stridor. No wheezing or rales.     Comments: Lower lobe crackles that clear with cough Abdominal:     General: Bowel sounds are normal.     Palpations: Abdomen is soft.     Tenderness: There is no abdominal tenderness.  Musculoskeletal:     Right lower leg: No edema.     Left lower leg: No edema.  Skin:    General: Skin is warm and dry.  Neurological:     General: No focal deficit present.     Mental Status: He is alert and oriented to person, place, and time.  Psychiatric:        Mood and Affect: Mood normal.         Behavior: Behavior normal.     Results for orders placed or performed in visit on 07/19/20 (from the past 24 hour(s))  CBC with Differential     Status: Abnormal   Collection Time: 07/19/20 12:06 PM  Result Value Ref Range   WBC 6.6 3.4 - 10.8 x10E3/uL   RBC 3.87 (L) 4.14 - 5.80 x10E6/uL   Hemoglobin 11.2 (L) 13.0 - 17.7 g/dL   Hematocrit 33.8 (L) 37.5 - 51.0 %   MCV 87 79 - 97 fL   MCH 28.9 26.6 - 33.0 pg   MCHC 33.1 31.5 - 35.7 g/dL   RDW 14.5 11.6 - 15.4 %   Platelets 252 150 - 450 x10E3/uL   Neutrophils 69 Not Estab. %   Lymphs 20 Not Estab. %   Monocytes 8 Not Estab. %   Eos 2 Not Estab. %   Basos 1 Not Estab. %   Neutrophils Absolute 4.6 1.4 - 7.0 x10E3/uL   Lymphocytes Absolute 1.3 0.7 - 3.1 x10E3/uL   Monocytes Absolute 0.5 0.1 - 0.9 x10E3/uL   EOS (ABSOLUTE) 0.1 0.0 - 0.4 x10E3/uL   Basophils Absolute 0.0 0.0 - 0.2 x10E3/uL   Immature Granulocytes 0 Not Estab. %   Immature Grans (Abs) 0.0 0.0 - 0.1 x10E3/uL   Narrative   Performed at:  [address], [place], [place]  [id number] Lab Director: Rush Farmer MD, Phone:  [phone]    DG Chest 2 View  Result Date: 07/19/2020 CLINICAL DATA:  Cough EXAM: CHEST - 2 VIEW COMPARISON:  02/07/2020 and prior. FINDINGS: No pneumothorax. Diffuse interstitial prominence and patchy bibasilar opacities. Small bilateral pleural effusions. Postsurgical appearance of the cardiomediastinal silhouette with central pulmonary vascular congestion. Multilevel spondylosis. IMPRESSION: Interstitial prominence and patchy basilar opacities, mild pulmonary edema versus infection. Small bilateral pleural effusions. Electronically Signed   By: Primitivo Gauze M.D.   On: 07/19/2020 12:15     ASSESSMENT and PLAN  Problem List Items Addressed This Visit   None   Visit Diagnoses    Cough    -  Primary   Relevant Medications   guaiFENesin 200 MG tablet   benzonatate (TESSALON) 100 MG capsule   Other Relevant  Orders   DG Chest 2 View (Completed)   CBC with Differential (Completed)   Community acquired pneumonia, unspecified laterality       Relevant Medications   guaiFENesin 200 MG tablet   benzonatate (TESSALON) 100 MG capsule      Return in about 6 weeks (around 08/30/2020).    Huston Foley Christelle Igoe, FNP-BC Primary Care at [facility] [place], [place] [address] Ph.  [phone] Fax [phone]

## 2020-07-20 NOTE — Progress Notes (Signed)
If you could let Sinai know his white count is normal and shows no signs of infection and his Chest xray showed no signs as well. We will continue treating the cough without antibiotics as discussed.

## 2020-08-17 ENCOUNTER — Other Ambulatory Visit: Payer: Self-pay | Admitting: Family Medicine

## 2020-08-17 NOTE — Telephone Encounter (Signed)
Copied from [place] [phone]. Topic: Quick Communication - Rx Refill/Question >> Aug 17, 2020  3:57 PM Leward Quan A wrote: Medication: allopurinol (ZYLOPRIM) 100 MG tablet, sertraline (ZOLOFT) 50 MG tablet   Has the patient contacted their pharmacy? Yes.   (Agent: If no, request that the patient contact the pharmacy for the refill.) (Agent: If yes, when and what did the pharmacy advise?)  Preferred Pharmacy (with phone number or street name): [id number] [place] [address] Phone:  [phone]:  [phone]    Agent: Please be advised that RX refills may take up to 3 business days. We ask that you follow-up with your pharmacy.

## 2020-08-17 NOTE — Telephone Encounter (Signed)
Requested medication (s) are due for refill today: Yes  Requested medication (s) are on the active medication list: Yes  Last refill:  05/08/19  Future visit scheduled: Yes  Notes to clinic:  Unable to refill per protocol, last refilled by another provider     Requested Prescriptions  Pending Prescriptions Disp Refills   sertraline (ZOLOFT) 50 MG tablet      Sig: Take 1 tablet (50 mg total) by mouth daily.      Psychiatry:  Antidepressants - SSRI Passed - 08/17/2020  4:05 PM      Passed - Valid encounter within last 6 months    Recent Outpatient Visits           4 weeks ago Cough   Primary Care at [place], Laurita Quint, FNP   1 month ago Cough   Primary Care at [place] Just, Laurita Quint, FNP   2 months ago Nausea   Primary Care at [place] Just, Laurita Quint, FNP   3 months ago Dizziness   Primary Care at [facility], [place], MD   3 months ago ESRD (end stage renal disease) on dialysis [facility])   Primary Care at [facility], Lilia Argue, MD       Future Appointments             In 1 week Just, Laurita Quint, FNP Primary Care at [facility], [facility]               allopurinol (ZYLOPRIM) 100 MG tablet 30 tablet 0    Sig: Take 1 tablet (100 mg total) by mouth daily.      Endocrinology:  Gout Agents Failed - 08/17/2020  4:05 PM      Failed - Uric Acid in normal range and within 360 days    Uric Acid, Serum  Date Value Ref Range Status  09/23/2015 7.2 4.0 - 7.8 mg/dL Final          Failed - Cr in normal range and within 360 days    Creat  Date Value Ref Range Status  09/23/2015 2.19 (H) 0.70 - 1.25 mg/dL Final   Creatinine, Ser  Date Value Ref Range Status  06/21/2020 7.87 (H) 0.61 - 1.24 mg/dL Final   Creatinine, Urine  Date Value Ref Range Status  01/25/2010 73.1 mg/dL Final          Passed - Valid encounter within last 12 months    Recent Outpatient Visits           4 weeks ago Cough   Primary Care at [place], Laurita Quint, FNP   1 month ago Cough    Primary Care at [place] Just, Laurita Quint, FNP   2 months ago Nausea   Primary Care at [place] Just, Laurita Quint, FNP   3 months ago Dizziness   Primary Care at [facility], [place], MD   3 months ago ESRD (end stage renal disease) on dialysis [facility])   Primary Care at [place], MD       Future Appointments             In 1 week Just, Laurita Quint, FNP Primary Care at [place], [facility]

## 2020-08-18 ENCOUNTER — Other Ambulatory Visit: Payer: Self-pay | Admitting: Family Medicine

## 2020-08-18 DIAGNOSIS — F4323 Adjustment disorder with mixed anxiety and depressed mood: Secondary | ICD-10-CM

## 2020-08-18 DIAGNOSIS — G4701 Insomnia due to medical condition: Secondary | ICD-10-CM

## 2020-08-18 MED ORDER — SERTRALINE HCL 50 MG PO TABS: 50.0000 mg | ORAL_TABLET | Freq: Every day | ORAL | 1 refills | Status: DC

## 2020-08-18 MED ORDER — ALPRAZOLAM 0.25 MG PO TABS: 0.2500 mg | ORAL_TABLET | Freq: Every evening | ORAL | 0 refills | Status: DC | PRN

## 2020-08-18 MED ORDER — ALLOPURINOL 100 MG PO TABS: 100.0000 mg | ORAL_TABLET | Freq: Every day | ORAL | 0 refills | Status: DC

## 2020-08-18 NOTE — Telephone Encounter (Signed)
Please review for refill. Unable to refill per protocol. Last refill: 06/07/20 #20

## 2020-08-18 NOTE — Telephone Encounter (Signed)
Patient is requesting a refill of the following medications: Requested Prescriptions   Pending Prescriptions Disp Refills   ALPRAZolam (XANAX) 0.25 MG tablet 20 tablet 0    Sig: Take 1 tablet (0.25 mg total) by mouth at bedtime as needed for anxiety or sleep (Nights before dialysis).    Date of patient request: 1/272022 Last office visit: 07/19/2020 Date of last refill: 06/07/2020 Last refill amount: 20 Follow up time period per chart: 6 weeks

## 2020-08-18 NOTE — Telephone Encounter (Signed)
Medication Refill - Medication: ALPRAZolam (XANAX) 0.25 MG tablet [  Pt has only 1 pill left, please advise   Has the patient contacted their pharmacy? Yes.   (Agent: If no, request that the patient contact the pharmacy for the refill.) (Agent: If yes, when and what did the pharmacy advise?)  Preferred Pharmacy (with phone number or street name):  [id number] [id number] Phone: [phone]: [phone]    Agent: Please be advised that RX refills may take up to 3 business days. We ask that you follow-up with your pharmacy.

## 2020-08-30 ENCOUNTER — Encounter: Payer: Self-pay | Admitting: Family Medicine

## 2020-08-30 ENCOUNTER — Telehealth (INDEPENDENT_AMBULATORY_CARE_PROVIDER_SITE_OTHER): Payer: Medicare Other | Admitting: Family Medicine

## 2020-08-30 ENCOUNTER — Other Ambulatory Visit: Payer: Self-pay

## 2020-08-30 DIAGNOSIS — F4323 Adjustment disorder with mixed anxiety and depressed mood: Secondary | ICD-10-CM | POA: Diagnosis not present

## 2020-08-30 DIAGNOSIS — G4701 Insomnia due to medical condition: Secondary | ICD-10-CM | POA: Diagnosis not present

## 2020-08-30 DIAGNOSIS — R0981 Nasal congestion: Secondary | ICD-10-CM

## 2020-08-30 DIAGNOSIS — R059 Cough, unspecified: Secondary | ICD-10-CM | POA: Diagnosis not present

## 2020-08-30 MED ORDER — GUAIFENESIN 200 MG PO TABS: 200.0000 mg | ORAL_TABLET | ORAL | 3 refills | Status: AC | PRN

## 2020-08-30 MED ORDER — ALBUTEROL SULFATE HFA 108 (90 BASE) MCG/ACT IN AERS: 2.0000 | INHALATION_SPRAY | Freq: Four times a day (QID) | RESPIRATORY_TRACT | 3 refills | Status: AC | PRN

## 2020-08-30 MED ORDER — FLUTICASONE PROPIONATE 50 MCG/ACT NA SUSP: 1.0000 | Freq: Every day | NASAL | 3 refills | Status: AC | PRN

## 2020-08-30 MED ORDER — ALPRAZOLAM 0.25 MG PO TABS: 0.2500 mg | ORAL_TABLET | Freq: Every evening | ORAL | 0 refills | Status: AC | PRN

## 2020-08-30 MED ORDER — AZELASTINE HCL 0.1 % NA SOLN: 2.0000 | Freq: Two times a day (BID) | NASAL | 3 refills | Status: AC

## 2020-08-30 NOTE — Progress Notes (Signed)
Virtual Visit Note  I connected with patient on 08/30/20 at 1013 by telephone due to unable to work Epic video visit and verified that I am speaking with the correct person using two identifiers. Tremaine A Iwai is currently located at home and no family members are currently with them during visit. The provider, Laurita Quint [place], FNP is located in their office at time of visit.  I discussed the limitations, risks, security and privacy concerns of performing an evaluation and management service by telephone and the availability of in person appointments. I also discussed with the patient that there may be a patient responsible charge related to this service. The patient expressed understanding and agreed to proceed.   I provided 20 minutes of non-face-to-face time during this encounter.  Chief Complaint  Patient presents with  . Fever    89 f , taking tylenol , nasal congestion- green/yellow - dry throat - taking ludens lozenges and cough syrup - robitussin w/ honey      HPI ? Patient is a 70 y.o. male with past medical history significant for HTN, HLD, gout,ESRDon HD, MI s/p 3V CABG, knee OAwho presents today for routine follow-up.  Cough is nearly gone even without medications Having thick green mucus from sinuses Never restarted mucinex Highest fever 99.9 Taking tylenol for the fever Taking nothing for the congestion Denies sinus pressure or pain  It was icy out yesterday so didn't go to dialysis Denies nausea with dialysis  M/W/F Dialysis Doesn't feel well when takes greater  Still having issues with anxiety Feels like it has gotten worse The toughest time is at night and makes is hard to sleep Has been out of Xanax for a week  Received samples last time at cardiology for [place] They did not change the medication  Notices right arm and hand swelling in the evenings Goes away during the night and doesn't return until the evening Is right handed [place] 2 weeks ago ( 2  mondays ago) Denies pain or sensory changes Denies LOC during fall Hit both elbows when fell  Verbalizes improved appetite Trying to eat healthy   Allergies  Allergen Reactions  . Codeine Phosphate Other (See Comments)    Hyperactive     Prior to Admission medications   Medication Sig Start Date End Date Taking? Authorizing Provider  acetaminophen (TYLENOL) 500 MG tablet Take 1,000 mg by mouth daily as needed for mild pain.   Yes [provider]  allopurinol (ZYLOPRIM) 100 MG tablet Take 1 tablet (100 mg total) by mouth daily. 08/18/20  Yes Chelcee Korpi, Laurita Quint, FNP  ALPRAZolam (XANAX) 0.25 MG tablet Take 1 tablet (0.25 mg total) by mouth at bedtime as needed for anxiety or sleep (Nights before dialysis). 08/18/20  Yes Thuan Tippett, Laurita Quint, FNP  amiodarone (PACERONE) 200 MG tablet TAKE 1 TABLET BY MOUTH EVERY DAY 01/26/20  Yes Larey Dresser, MD  bisoprolol (ZEBETA) 5 MG tablet Take 1 tablet as directed on non dialysis tues,thurs,sat,sun Patient taking differently: Take 5 mg by mouth See admin instructions. Take 1 tablet as directed on non dialysis tues,thurs,sat,sun 02/22/20  Yes Larey Dresser, MD  cetirizine (ZYRTEC) 10 MG tablet Take 10 mg by mouth daily as needed for allergies.   Yes [provider]  cinacalcet (SENSIPAR) 90 MG tablet Take 90 mg by mouth daily.   Yes [provider]  diphenhydramine-acetaminophen (TYLENOL PM) 25-500 MG TABS tablet Take 1 tablet by mouth at bedtime as needed (sleep). TAKES AT DIALYSIS  FROM TIME TO TIME   Yes [provider]  ELIQUIS 5 MG TABS tablet TAKE 1 TABLET BY MOUTH TWICE A DAY 10/21/19  Yes Larey Dresser, MD  ferric citrate (AURYXIA) 1 GM 210 MG(Fe) tablet Take 210 mg by mouth 3 (three) times daily with meals.    Yes [provider]  fluticasone (FLONASE) 50 MCG/ACT nasal spray Place 1 spray into both nostrils daily as needed for allergies or rhinitis.   Yes [provider]  gabapentin (NEURONTIN) 100  MG capsule TAKE 1 CAPSULE AT BEDTIME ON HD DAYS (M,W,F) Patient taking differently: Take 100 mg by mouth every Monday, Wednesday, and Friday. Take on dialysis days 12/17/19  Yes Jacelyn Pi, Lilia Argue, MD  guaiFENesin 200 MG tablet Take 1 tablet (200 mg total) by mouth every 4 (four) hours as needed for cough or to loosen phlegm. 07/19/20  Yes Kelbie Moro, Laurita Quint, FNP  meclizine (ANTIVERT) 12.5 MG tablet Take 1 tablet (12.5 mg total) by mouth 3 (three) times daily as needed for dizziness. 05/04/20  Yes Sagardia, Ines Bloomer, MD  Methoxy PEG-Epoetin Beta (MIRCERA IJ) Mircera 02/24/20 02/22/21 Yes [provider]  midodrine (PROAMATINE) 10 MG tablet Take 10 mg by mouth daily.   Yes [provider]  Multiple Vitamins-Minerals (CENTRUM SILVER 50+MEN) TABS Take 1 tablet by mouth daily.   Yes [provider]  multivitamin (RENA-VIT) TABS tablet Take 1 tablet by mouth at bedtime. 05/07/19  Yes Angiulli, Lavon Paganini, PA-C  nitroGLYCERIN (NITROSTAT) 0.4 MG SL tablet Place 1 tablet (0.4 mg total) under the tongue every 5 (five) minutes as needed for chest pain. 02/07/20  Yes Sheikh, Omair Latif, DO  ondansetron (ZOFRAN) 4 MG tablet Take 1 tablet (4 mg total) by mouth every 8 (eight) hours as needed for nausea or vomiting. 06/07/20  Yes Leanore Biggers, Laurita Quint, FNP  pantoprazole (PROTONIX) 40 MG tablet TAKE 1 TABLET (40 MG TOTAL) BY MOUTH DAILY AS NEEDED (ACID REFLUX). 04/21/20  Yes Jacelyn Pi, Lilia Argue, MD  predniSONE (DELTASONE) 20 MG tablet Take 1 tablet (20 mg total) by mouth daily with breakfast. Take 2 tablets day 1 and one each day after 07/11/20  Yes Kandice Schmelter, Laurita Quint, FNP  promethazine (PHENERGAN) 12.5 MG tablet Take 1 tablet (12.5 mg total) by mouth every 8 (eight) hours as needed for nausea or vomiting. 03/03/20  Yes Jacelyn Pi, Lilia Argue, MD  sertraline (ZOLOFT) 50 MG tablet Take 1 tablet (50 mg total) by mouth daily. 08/18/20  Yes Tomio Kirk, Laurita Quint, FNP  triamcinolone cream (KENALOG) 0.1 % Apply 1  application topically 2 (two) times daily as needed. 02/16/20  Yes Jacelyn Pi, Lilia Argue, MD  albuterol (VENTOLIN HFA) 108 (90 Base) MCG/ACT inhaler Inhale 2 puffs into the lungs every 6 (six) hours as needed for wheezing or shortness of breath. Patient not taking: Reported on 08/30/2020 07/11/20   Jaxyn Rout, Laurita Quint, FNP  benzonatate (TESSALON) 100 MG capsule Take 1-2 capsules (100-200 mg total) by mouth 2 (two) times daily as needed for cough. Patient not taking: Reported on 08/30/2020 07/19/20   Lorenzo Pereyra, Laurita Quint, FNP  Dextromethorphan-guaiFENesin (MUCINEX DM MAXIMUM STRENGTH) 60-1200 MG TB12 Take 1 tablet by mouth every 12 (twelve) hours. Patient not taking: Reported on 08/30/2020 07/11/20   Shilee Biggs, Laurita Quint, FNP  rosuvastatin (CRESTOR) 10 MG tablet Take 1 tablet (10 mg total) by mouth daily. 09/29/19 06/21/20  Larey Dresser, MD    Past Medical History:  Diagnosis Date  . Cervical disc disease   .  CHF (congestive heart failure) ([place])   . Chronic kidney disease   . COLONIC POLYPS, HX OF 06/27/2007  . EXOGENOUS OBESITY 01/30/2010  . GERD (gastroesophageal reflux disease)    PMH  . GOUT 01/30/2010  . HYPERCHOLESTEROLEMIA 06/30/2007  . HYPERTENSION 06/27/2007  . LOW BACK PAIN 06/27/2007  . NEPHROLITHIASIS, HX OF 06/27/2007  . OSTEOARTHRITIS 06/27/2007  . SLEEP APNEA, OBSTRUCTIVE, MODERATE 01/27/2009  . Wears dentures    upper    Past Surgical History:  Procedure Laterality Date  . AV FISTULA PLACEMENT Right 01/19/2019   Procedure: RIGHT BRACHIOCEPHALIC ARTERIOVENOUS (AV) FISTULA CREATION;  Surgeon: Angelia Mould, MD;  Location: [place];  Service: Vascular;  Laterality: Right;  . CARDIAC CATHETERIZATION    . CARPAL TUNNEL RELEASE     left hand  . COLONOSCOPY     with polypectomy  . CORONARY ARTERY BYPASS GRAFT N/A 04/16/2019   Procedure: CORONARY ARTERY BYPASS GRAFTING (CABG)X3  , WITH ENDOSCOPIC HARVESTING OF RIGHT GREATER SAPHENOUS VEIN;  Surgeon: Lajuana Matte, MD;  Location: [place];   Service: Open Heart Surgery;  Laterality: N/A;  . CORONARY/GRAFT ACUTE MI REVASCULARIZATION N/A 04/16/2019   Procedure: CORONARY/GRAFT ACUTE MI REVASCULARIZATION;  Surgeon: Burnell Blanks, MD;  Location: [place] CV [facility];  Service: Cardiovascular;  Laterality: N/A;  . INSERTION OF DIALYSIS CATHETER N/A 04/29/2019   Procedure: INSERTION OF TUNNELED DIALYSIS CATHETER, right internal jugular;  Surgeon: Rosetta Posner, MD;  Location: [place];  Service: Vascular;  Laterality: N/A;  . IR US GUIDE BX ASP/DRAIN  12/29/2019  . JOINT REPLACEMENT Left 2005   knee  . LEFT HEART CATH AND CORS/GRAFTS ANGIOGRAPHY N/A 02/05/2020   Procedure: LEFT HEART CATH AND CORS/GRAFTS ANGIOGRAPHY;  Surgeon: Larey Dresser, MD;  Location: [place] [facility];  Service: Cardiovascular;  Laterality: N/A;  . LUMBAR LAMINECTOMY    . MULTIPLE TOOTH EXTRACTIONS    . PERIPHERAL VASCULAR BALLOON ANGIOPLASTY Right 06/25/2019   Procedure: PERIPHERAL VASCULAR BALLOON ANGIOPLASTY;  Surgeon: Marty Heck, MD;  Location: [place] [facility];  Service: Cardiovascular;  Laterality: Right;  . RADIOFREQUENCY ABLATION KIDNEY    . RIGHT/LEFT HEART CATH AND CORONARY ANGIOGRAPHY N/A 04/16/2019   Procedure: RIGHT/LEFT HEART CATH AND CORONARY ANGIOGRAPHY;  Surgeon: Burnell Blanks, MD;  Location: [place] [facility];  Service: Cardiovascular;  Laterality: N/A;  . TEE WITHOUT CARDIOVERSION N/A 04/16/2019   Procedure: TRANSESOPHAGEAL ECHOCARDIOGRAM (TEE);  Surgeon: Lajuana Matte, MD;  Location: [place];  Service: Open Heart Surgery;  Laterality: N/A;  . TEE WITHOUT CARDIOVERSION N/A 10/08/2019   Procedure: TRANSESOPHAGEAL ECHOCARDIOGRAM (TEE);  Surgeon: Larey Dresser, MD;  Location: [facility];  Service: Cardiovascular;  Laterality: N/A;  . TOTAL KNEE ARTHROPLASTY     left  . VENTRICULAR ASSIST DEVICE INSERTION N/A 04/16/2019   Procedure: VENTRICULAR ASSIST DEVICE INSERTION;  Surgeon: Burnell Blanks, MD;  Location:  [place] [facility];  Service: Cardiovascular;  Laterality: N/A;    Social History   Tobacco Use  . Smoking status: Former Smoker    Types: Cigarettes    Quit date: 07/23/1980    Years since quitting: 40.1  . Smokeless tobacco: Never Used  Substance Use Topics  . Alcohol use: Yes    Alcohol/week: 1.0 standard drink    Types: 1 Cans of beer per week    Comment: rare    Family History  Problem Relation Age of Onset  . Hypertension Other   . Diabetes Sister   .  Hyperlipidemia Sister   . Sleep apnea Sister     Review of Systems  Constitutional: Negative for chills, fever and malaise/fatigue.  HENT: Positive for congestion. Negative for sinus pain and sore throat.   Respiratory: Negative for cough, hemoptysis, sputum production, shortness of breath and wheezing.   Cardiovascular: Negative for chest pain and palpitations.  Gastrointestinal: Negative for abdominal pain, constipation, diarrhea, heartburn, nausea and vomiting.  Genitourinary: Negative for dysuria, frequency and urgency.  Musculoskeletal: Positive for falls. Negative for back pain, myalgias and neck pain.  Skin: Negative for rash.  Neurological: Negative for dizziness, tingling, speech change and headaches.    Objective  Constitutional:      General: Not in acute distress.    Appearance: Normal appearance. Not ill-appearing.   Pulmonary:     Effort: Pulmonary effort is normal. No respiratory distress.  Neurological:     Mental Status: Alert and oriented to person, place, and time.  Psychiatric:        Mood and Affect: Mood normal.        Behavior: Behavior normal.     ASSESSMENT and PLAN  Problem List Items Addressed This Visit      Other   Insomnia, unspecified   Relevant Medications   ALPRAZolam (XANAX) 0.25 MG tablet   Adjustment reaction with anxiety and depression   Relevant Medications   ALPRAZolam (XANAX) 0.25 MG tablet    Other Visit Diagnoses    Congestion of paranasal sinus    -  Primary    Relevant Medications   fluticasone (FLONASE) 50 MCG/ACT nasal spray   azelastine (ASTELIN) 0.1 % nasal spray   Cough       Relevant Medications   albuterol (VENTOLIN HFA) 108 (90 Base) MCG/ACT inhaler   guaiFENesin 200 MG tablet      Plan . Refills sent . Discussed treatments for congestion: r/se/b of medications discussed . Rest, ice, elevate, and compression for right arm as able: f/u precautions provided . RTC/ED precautions provided . Continue three times a week dialysis, encouraged to not miss appointments  Return in about 6 weeks (around 10/11/2020).    The above assessment and management plan was discussed with the patient. The patient verbalized understanding of and has agreed to the management plan. Patient is aware to call the clinic if symptoms persist or worsen. Patient is aware when to return to the clinic for a follow-up visit. Patient educated on when it is appropriate to go to the emergency department.     Huston Foley Karelyn Brisby, FNP-BC Primary Care at [place],  [address] Ph.  [phone] Fax [phone]

## 2020-08-30 NOTE — Patient Instructions (Addendum)
  Sinusitis, Adult Sinusitis is soreness and swelling (inflammation) of your sinuses. Sinuses are hollow spaces in the bones around your face. They are located:  Around your eyes.  In the middle of your forehead.  Behind your nose.  In your cheekbones. Your sinuses and nasal passages are lined with a fluid called mucus. Mucus drains out of your sinuses. Swelling can trap mucus in your sinuses. This lets germs (bacteria, virus, or fungus) grow, which leads to infection. Most of the time, this condition is caused by a virus. What are the causes? This condition is caused by:  Allergies.  Asthma.  Germs.  Things that block your nose or sinuses.  Growths in the nose (nasal polyps).  Chemicals or irritants in the air.  Fungus (rare). What increases the risk? You are more likely to develop this condition if:  You have a weak body defense system (immune system).  You do a lot of swimming or diving.  You use nasal sprays too much.  You smoke. What are the signs or symptoms? The main symptoms of this condition are pain and a feeling of pressure around the sinuses. Other symptoms include:  Stuffy nose (congestion).  Runny nose (drainage).  Swelling and warmth in the sinuses.  Headache.  Toothache.  A cough that may get worse at night.  Mucus that collects in the throat or the back of the nose (postnasal drip).  Being unable to smell and taste.  Being very tired (fatigue).  A fever.  Sore throat.  Bad breath. How is this diagnosed? This condition is diagnosed based on:  Your symptoms.  Your medical history.  A physical exam.  Tests to find out if your condition is short-term (acute) or long-term (chronic). Your doctor may: ? Check your nose for growths (polyps). ? Check your sinuses using a tool that has a light (endoscope). ? Check for allergies or germs. ? Do imaging tests, such as an MRI or CT scan. How is this treated? Treatment for this  condition depends on the cause and whether it is short-term or long-term.  If caused by a virus, your symptoms should go away on their own within 10 days. You may be given medicines to relieve symptoms. They include: ? Medicines that shrink swollen tissue in the nose. ? Medicines that treat allergies (antihistamines). ? A spray that treats swelling of the nostrils. ? Rinses that help get rid of thick mucus in your nose (nasal saline washes).  If caused by bacteria, your doctor may wait to see if you will get better without treatment. You may be given antibiotic medicine if you have: ? A very bad infection. ? A weak body defense system.  If caused by growths in the nose, you may need to have surgery. Follow these instructions at home: Medicines  Take, use, or apply over-the-counter and prescription medicines only as told by your doctor. These may include nasal sprays.  If you were prescribed an antibiotic medicine, take it as told by your doctor. Do not stop taking the antibiotic even if you start to feel better. Hydrate and humidify  Drink enough water to keep your pee (urine) pale yellow.  Use a cool mist humidifier to keep the humidity level in your home above 50%.  Breathe in steam for 10-15 minutes, 3-4 times a day, or as told by your doctor. You can do this in the bathroom while a hot shower is running.  Try not to spend time in cool or dry   air.   Rest  Rest as much as you can.  Sleep with your head raised (elevated).  Make sure you get enough sleep each night. General instructions  Put a warm, moist washcloth on your face 3-4 times a day, or as often as told by your doctor. This will help with discomfort.  Wash your hands often with soap and water. If there is no soap and water, use hand sanitizer.  Do not smoke. Avoid being around people who are smoking (secondhand smoke).  Keep all follow-up visits as told by your doctor. This is important.   Contact a doctor  if:  You have a fever.  Your symptoms get worse.  Your symptoms do not get better within 10 days. Get help right away if:  You have a very bad headache.  You cannot stop throwing up (vomiting).  You have very bad pain or swelling around your face or eyes.  You have trouble seeing.  You feel confused.  Your neck is stiff.  You have trouble breathing. Summary  Sinusitis is swelling of your sinuses. Sinuses are hollow spaces in the bones around your face.  This condition is caused by tissues in your nose that become inflamed or swollen. This traps germs. These can lead to infection.  If you were prescribed an antibiotic medicine, take it as told by your doctor. Do not stop taking it even if you start to feel better.  Keep all follow-up visits as told by your doctor. This is important. This information is not intended to replace advice given to you by your health care provider. Make sure you discuss any questions you have with your health care provider. Document Revised: 12/09/2017 Document Reviewed: 12/09/2017 Elsevier Patient Education  2021 Elsevier Inc.   If you have lab work done today you will be contacted with your lab results within the next 2 weeks.  If you have not heard from us then please contact us. The fastest way to get your results is to register for My Chart.   IF you received an x-ray today, you will receive an invoice from [facility]. Please contact [facility] at [phone] with questions or concerns regarding your invoice.   IF you received labwork today, you will receive an invoice from [facility]. Please contact [facility] at [phone] with questions or concerns regarding your invoice.   Our billing staff will not be able to assist you with questions regarding bills from these companies.  You will be contacted with the lab results as soon as they are available. The fastest way to get your results is to activate your My Chart  account. Instructions are located on the last page of this paperwork. If you have not heard from us regarding the results in 2 weeks, please contact this office.      

## 2020-09-13 ENCOUNTER — Ambulatory Visit (HOSPITAL_COMMUNITY): Admission: RE | Admit: 2020-09-13 | Payer: Medicare Other | Source: Ambulatory Visit

## 2020-09-13 ENCOUNTER — Other Ambulatory Visit: Payer: Self-pay | Admitting: Family Medicine

## 2020-09-13 ENCOUNTER — Encounter (HOSPITAL_COMMUNITY): Payer: Medicare Other | Admitting: Cardiology

## 2020-09-13 NOTE — Telephone Encounter (Signed)
   Notes to Clinic: Requesting a 90 day    Requested Prescriptions  Pending Prescriptions Disp Refills   sertraline (ZOLOFT) 50 MG tablet [Pharmacy Med Name: SERTRALINE HCL 50 MG TABLET] 90 tablet 1    Sig: TAKE 1 TABLET BY MOUTH EVERY DAY      Psychiatry:  Antidepressants - SSRI Passed - 09/13/2020  1:35 PM      Passed - Valid encounter within last 6 months    Recent Outpatient Visits           2 weeks ago Congestion of paranasal sinus   Primary Care at [place] Just, Laurita Quint, FNP   1 month ago Cough   Primary Care at [facility] Just, Laurita Quint, FNP   2 months ago Cough   Primary Care at [facility] Just, Laurita Quint, FNP   3 months ago Nausea   Primary Care at [place] Just, Laurita Quint, FNP   4 months ago Dizziness   Primary Care at [facility], Ines Bloomer, MD       Future Appointments             In 4 weeks Just, Laurita Quint, FNP Primary Care at [place], [facility]

## 2020-09-19 ENCOUNTER — Telehealth: Payer: Self-pay | Admitting: Family Medicine

## 2020-09-19 ENCOUNTER — Other Ambulatory Visit: Payer: Self-pay | Admitting: Family Medicine

## 2020-09-19 NOTE — Telephone Encounter (Signed)
Electronically is fine.

## 2020-09-19 NOTE — Telephone Encounter (Signed)
Todd from [employer] [facility] in [place] needs a death certificate signed. Please Advise.

## 2020-09-19 NOTE — Telephone Encounter (Signed)
I got the message to do it electronically todya, Alex Williams haven't done it yet. I thought we were not doing paper ones anymore

## 2020-09-19 NOTE — Telephone Encounter (Signed)
09/19/2020 - I RECEIVED A CALL FROM TODD AT [facility] IN [facility] REGARDING [name]. HE WANTED TO KNOW WHO WOULD SIGN HIS DEATH CERTIFICATE SINCE DR. SANTIAGO NO LONGER WORKS AT THIS OFFICE. I SAW WHERE KELSEA JUST IS IN THE SYSTEM AS HIS PRIMARY CARE PROVIDER NOW. TODD WILL LOOK TO SEE IF WE CAN RECEIVE A DEATH CERTIFICATE THROUGH THE NEW ELECTRONIC SYSTEM. OTHERWISE HE WILL DROP OFF THE PAPER COPY FOR KELSEA TO SIGN. TODD'S PHONE IF QUESTIONS: [phone]  [place]

## 2020-09-20 DEATH — deceased

## 2020-09-26 ENCOUNTER — Other Ambulatory Visit: Payer: Self-pay | Admitting: Family Medicine

## 2020-09-26 NOTE — Telephone Encounter (Signed)
Requested medications are due for refill today.  yes  Requested medications are on the active medications list.  yes  Last refill. 09/13/2020  Future visit scheduled.   no  Notes to clinic. Overdue for labs. Has not been seen for gout.

## 2020-10-11 ENCOUNTER — Ambulatory Visit: Payer: Medicare Other | Admitting: Family Medicine

## 2020-10-23 ENCOUNTER — Other Ambulatory Visit (HOSPITAL_COMMUNITY): Payer: Self-pay | Admitting: Cardiology

## 2020-11-23 IMAGING — US RIGHT LOWER EXTREMITY VENOUS ULTRASOUND
1 series · 13 of 24 positions shown · non-contrast
Comparison: None.

CLINICAL DATA: Right lower extremity pain and edema. Evaluate for
DVT.



[Series 1: right lower extremity venous ultrasound · 0.10mm/px · 13 of 34 slices shown]
[im 1/34]
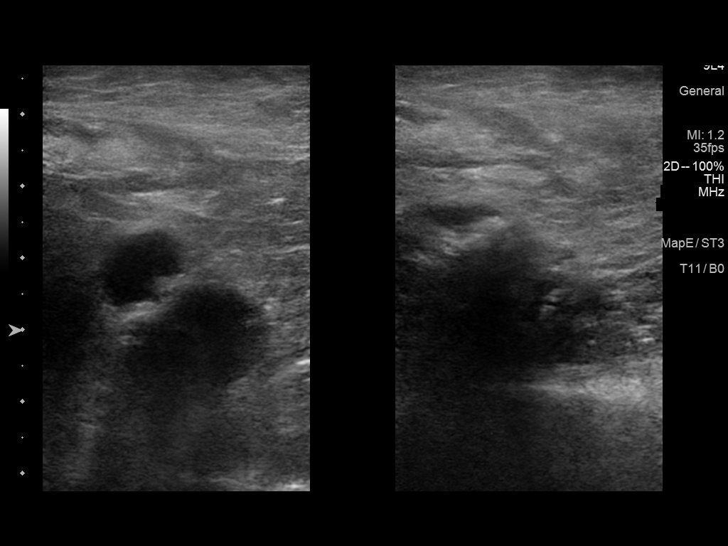
[im 3/34]
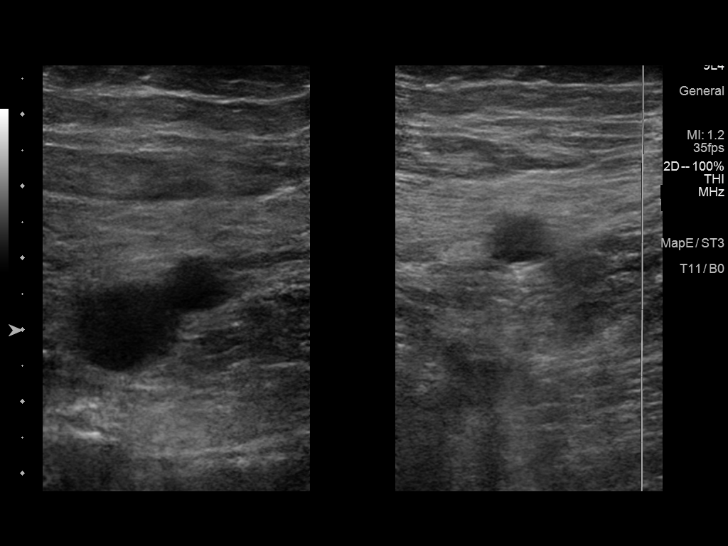
[im 6/34]
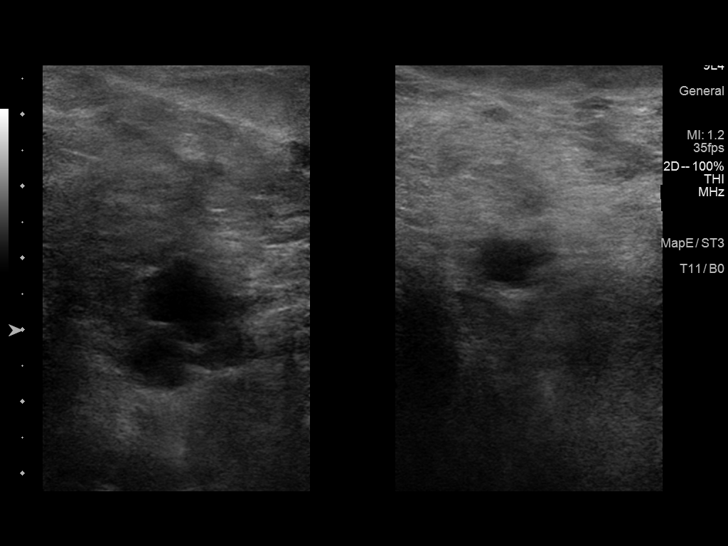
[im 9/34]
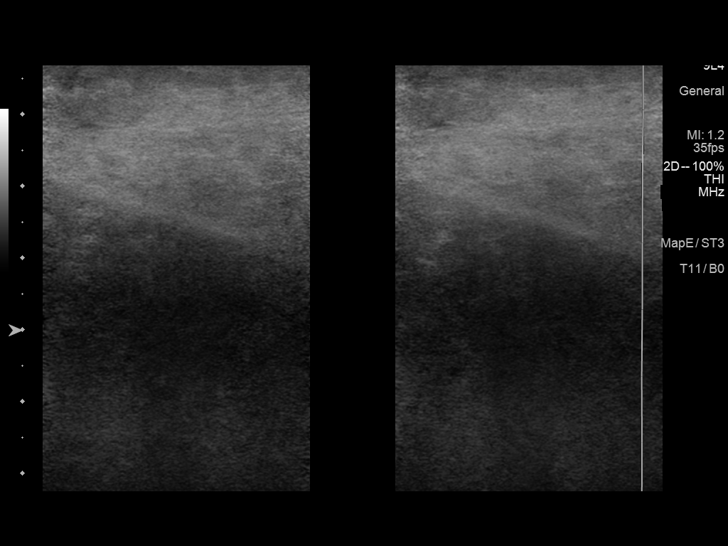
[im 12/34]
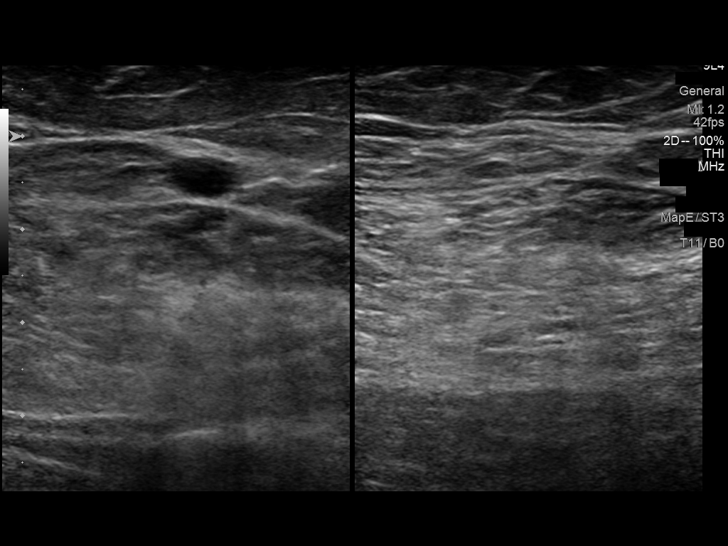
[im 15/34]
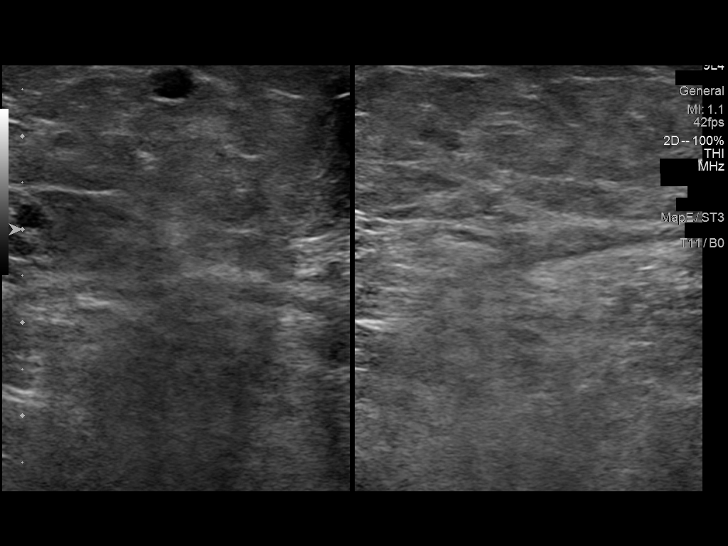
[im 18/34]
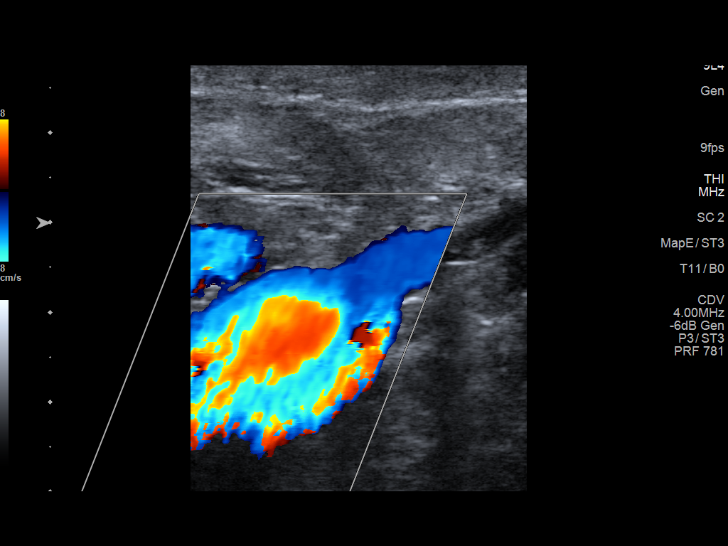
[im 19/34]
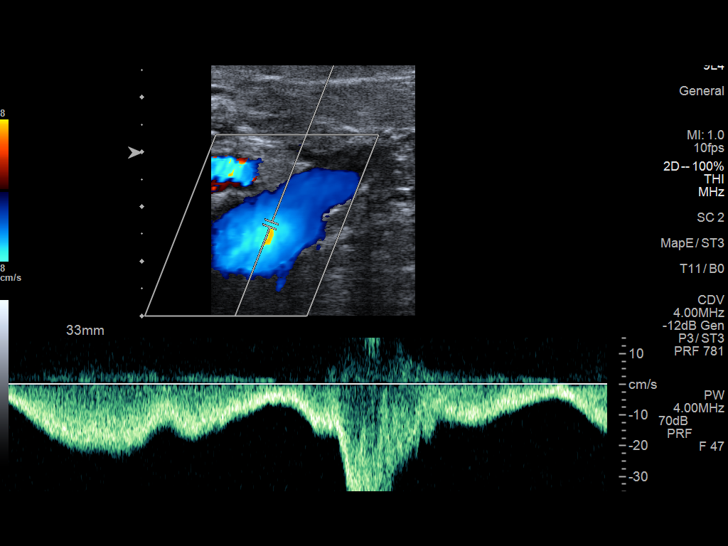
[im 22/34]
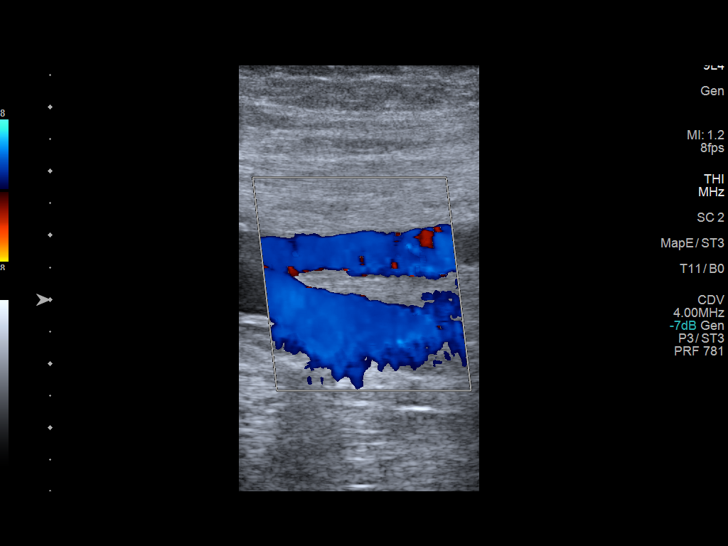
[im 25/34]
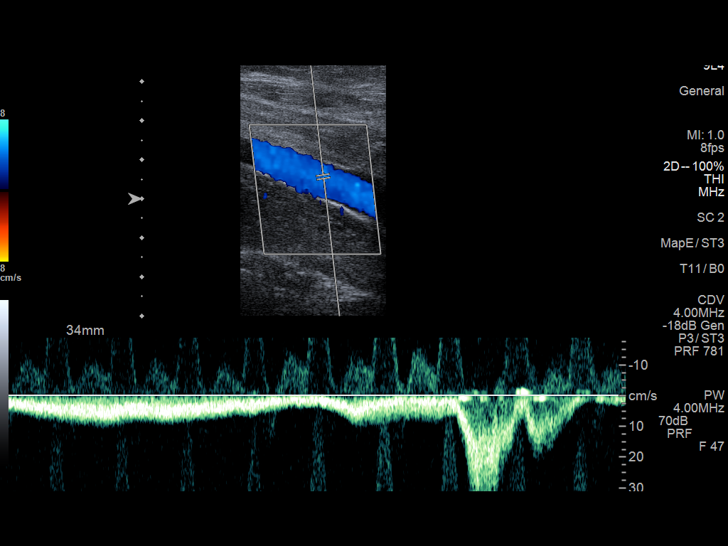
[im 28/34]
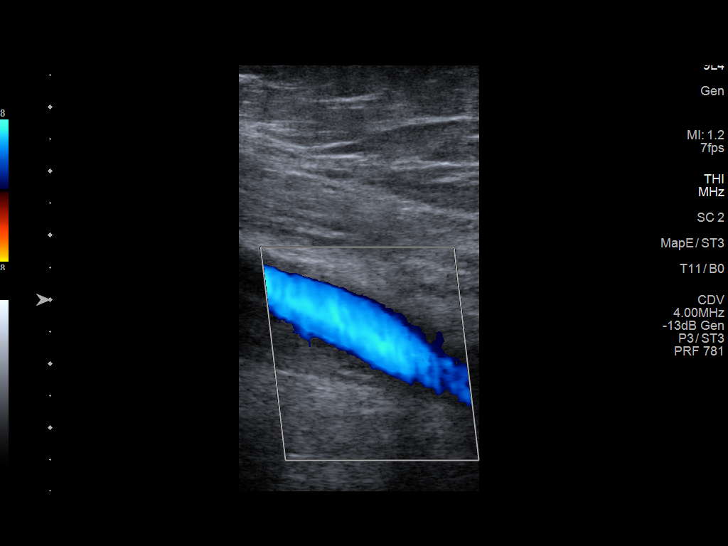
[im 31/34]
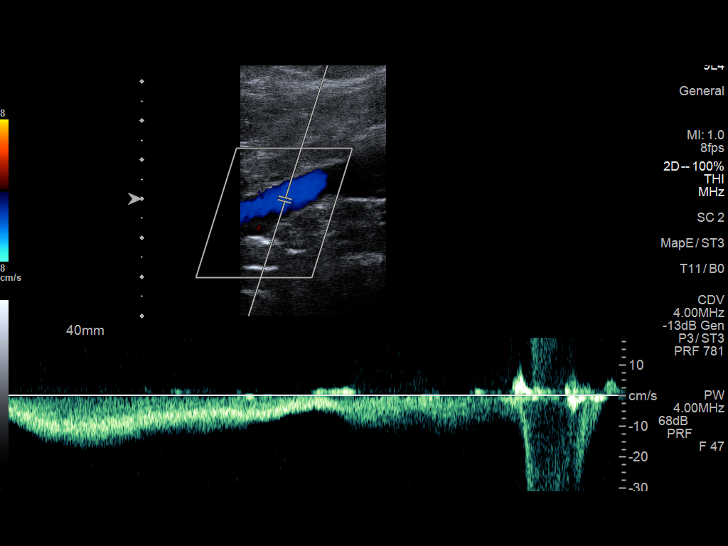
[im 34/34]
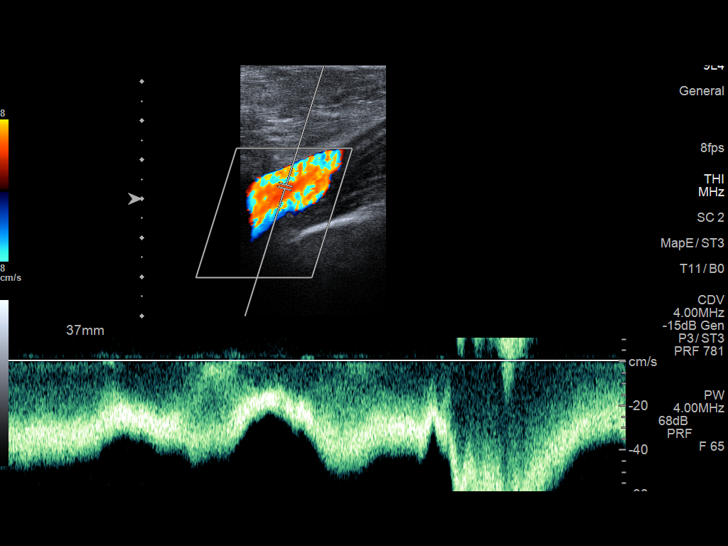

[13 of 24 positions shown; findings below may reference images not displayed]

FINDINGS: Contralateral Common Femoral Vein: Respiratory phasicity is normal
and symmetric with the symptomatic side. No evidence of thrombus.
Normal compressibility.

Common Femoral Vein: No evidence of thrombus. Normal
compressibility, respiratory phasicity and response to augmentation.

Saphenofemoral Junction: No evidence of thrombus. Normal
compressibility and flow on color Doppler imaging.

Profunda Femoral Vein: No evidence of thrombus. Normal
compressibility and flow on color Doppler imaging.

Femoral Vein: No evidence of thrombus. Normal compressibility,
respiratory phasicity and response to augmentation.

Popliteal Vein: No evidence of thrombus. Normal compressibility,
respiratory phasicity and response to augmentation.

Calf Veins: No evidence of thrombus. Normal compressibility and flow
on color Doppler imaging.

Superficial Great Saphenous Vein: No evidence of thrombus. Normal
compressibility.

Venous Reflux:  None.

Other Findings:  None.
IMPRESSION: No evidence of DVT within the right lower extremity.

## 2021-04-03 IMAGING — DX DG CHEST 1V PORT
1 series · 2 of 2 positions shown · non-contrast
Comparison: Single-view of the chest 04/18/2019.

CLINICAL DATA: Status post left subclavian central line placement
today.

EXAM:
PORTABLE CHEST 1 VIEW

[Series 1: chest · 0.14mm/px · 2 of 2 slices shown]
[im 1/2]
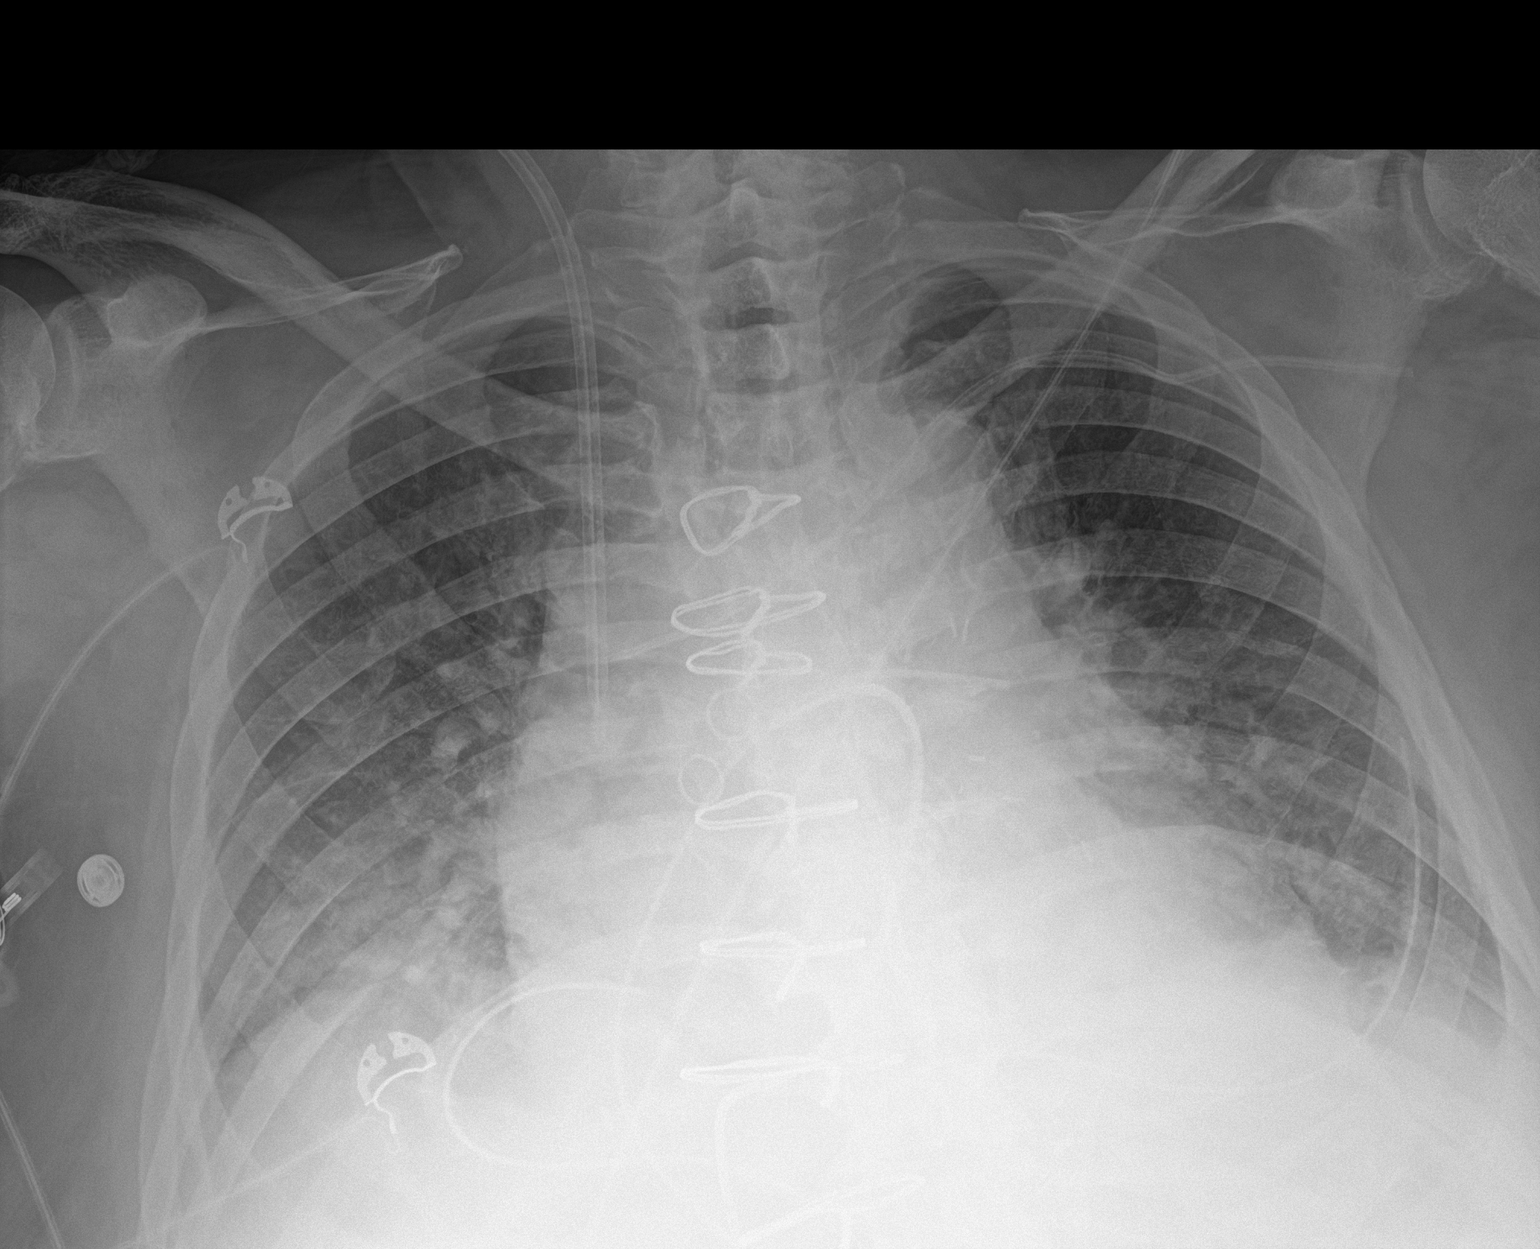
[im 2/2]
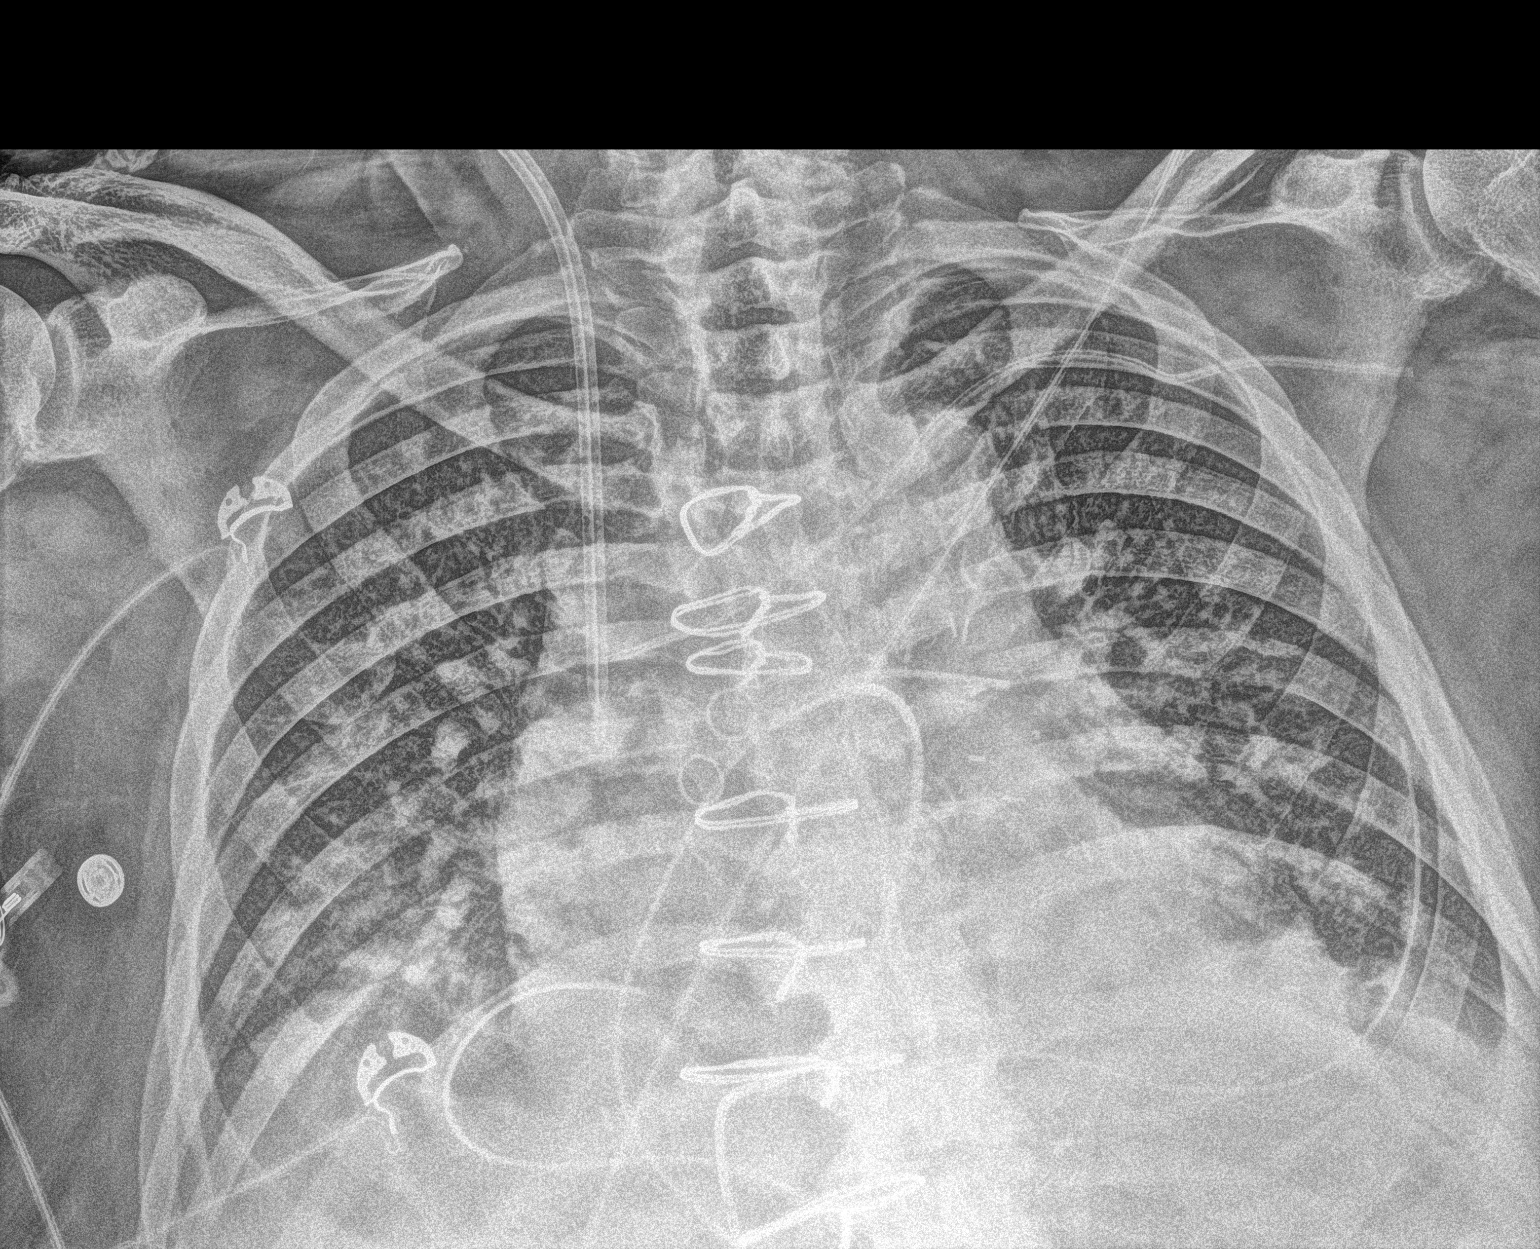

[2 of 2 positions shown; findings below may reference images not displayed]

FINDINGS: New left subclavian central venous catheter is in place with its tip
projecting in the mid to upper superior vena cava. Impella device is
no longer seen. Likely Swan-Ganz catheter from an inferior approach
is looped in the right ventricle with its tip projecting retrograde.
Mediastinal drain and left chest tube remain in place.

There is no pneumothorax. Cardiomegaly and mild interstitial edema
are seen. There is basilar atelectasis. The patient is status post
CABG.
IMPRESSION: Left subclavian catheter tip projects in the mid to upper superior
vena cava. No pneumothorax.

Likely Swan-Ganz catheter from an inferior approach now appears
looped with its tip projecting retrograde in the right ventricle.

Interstitial pulmonary edema and bibasilar atelectasis.

These results were called by telephone at the time of interpretation
on 04/20/2019 at [DATE] to the patient's nurse, Castillo, who
verbally acknowledged these results.

## 2021-04-12 IMAGING — DX DG CHEST 1V PORT
1 series · 1 of 1 positions shown · non-contrast
Comparison: 04/28/2019

CLINICAL DATA: Status post dialysis catheter insertion.

EXAM:
PORTABLE CHEST 1 VIEW

[chest ap]
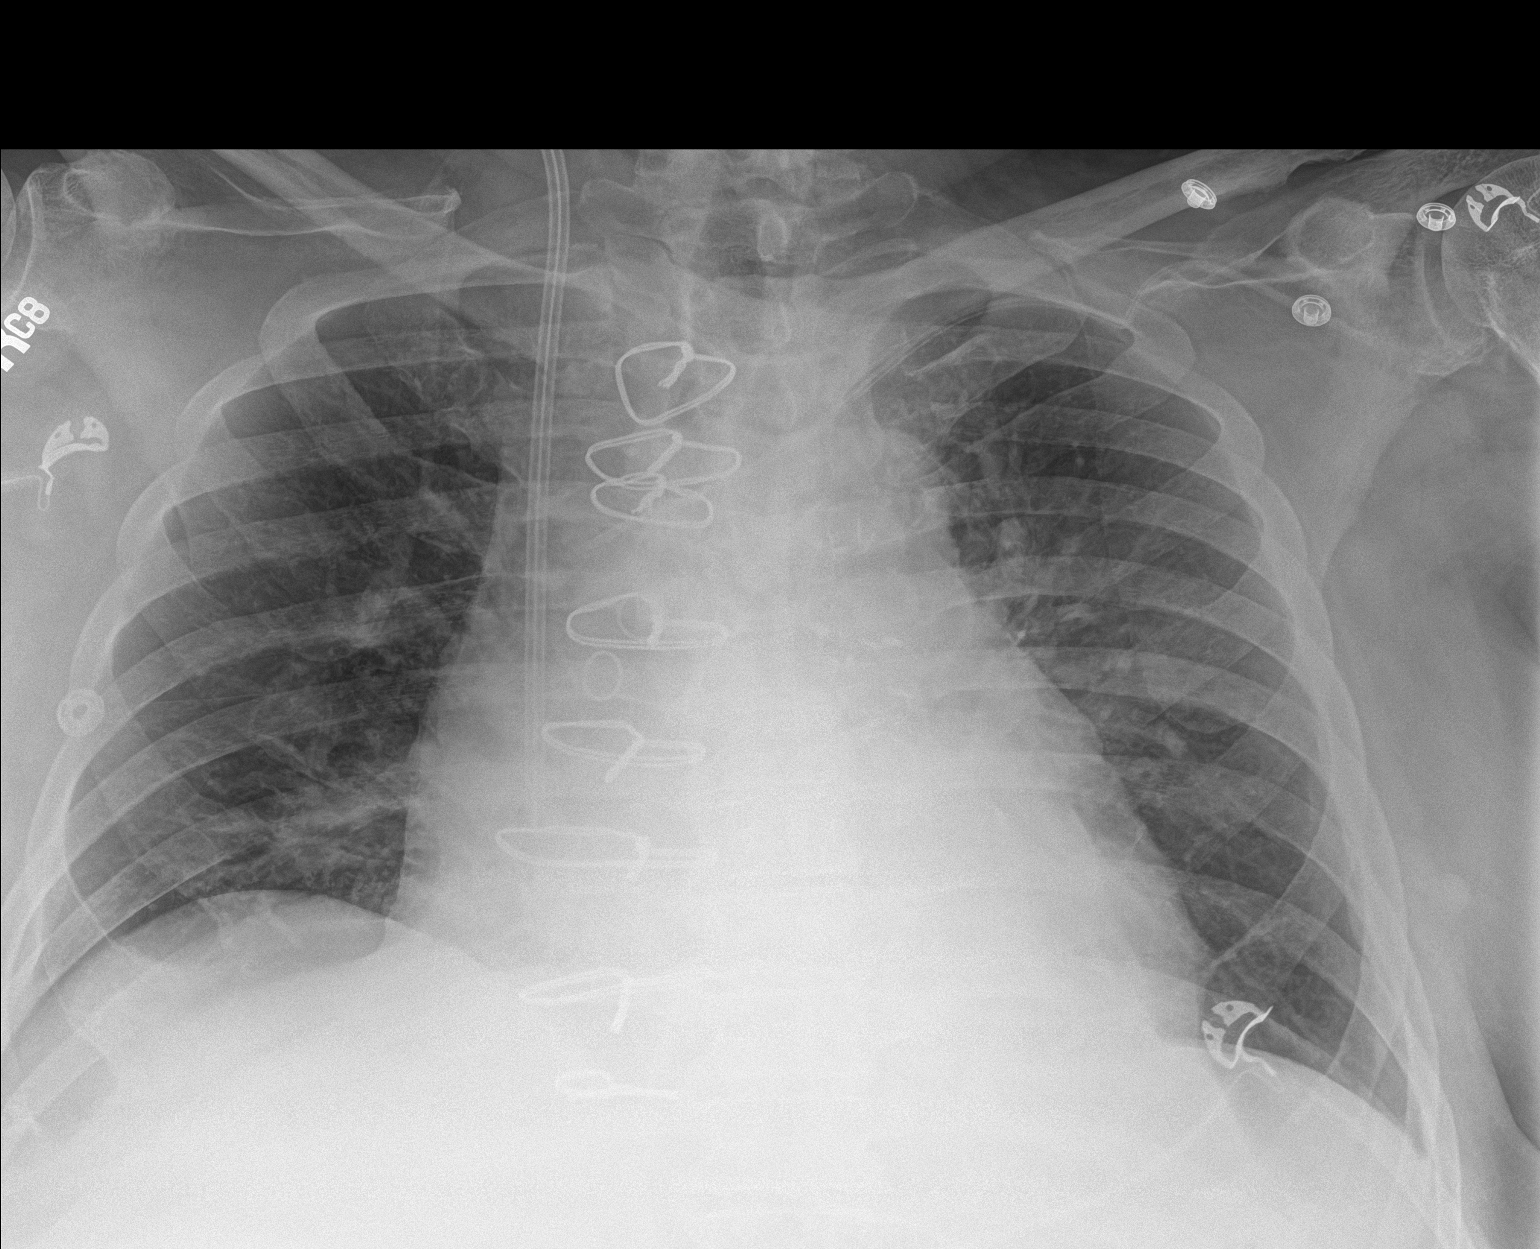

[1 of 1 positions shown; findings below may reference images not displayed]

FINDINGS: 4608 hours. Low volume film. The cardio pericardial silhouette is
enlarged. There is pulmonary vascular congestion without overt
pulmonary edema. No pneumothorax or pleural effusion. Left
subclavian central line tip overlies the left innominate vein near
the innominate vein confluence. Right IJ central line tip overlies
the upper right atrium. The visualized bony structures of the thorax
are intact. Telemetry leads overlie the chest.
IMPRESSION: 1. Low volume film with pulmonary vascular congestion.
2. Right IJ central line tip overlies the upper right atrium. Stable
left subclavian line.

## 2021-05-05 IMAGING — DX DG CHEST 2V
2 series · 2 of 2 positions shown · non-contrast
Comparison: 04/29/2019

CLINICAL DATA: Post CABG

EXAM:
CHEST - 2 VIEW

[dg chest 2 view (1 of 2)]
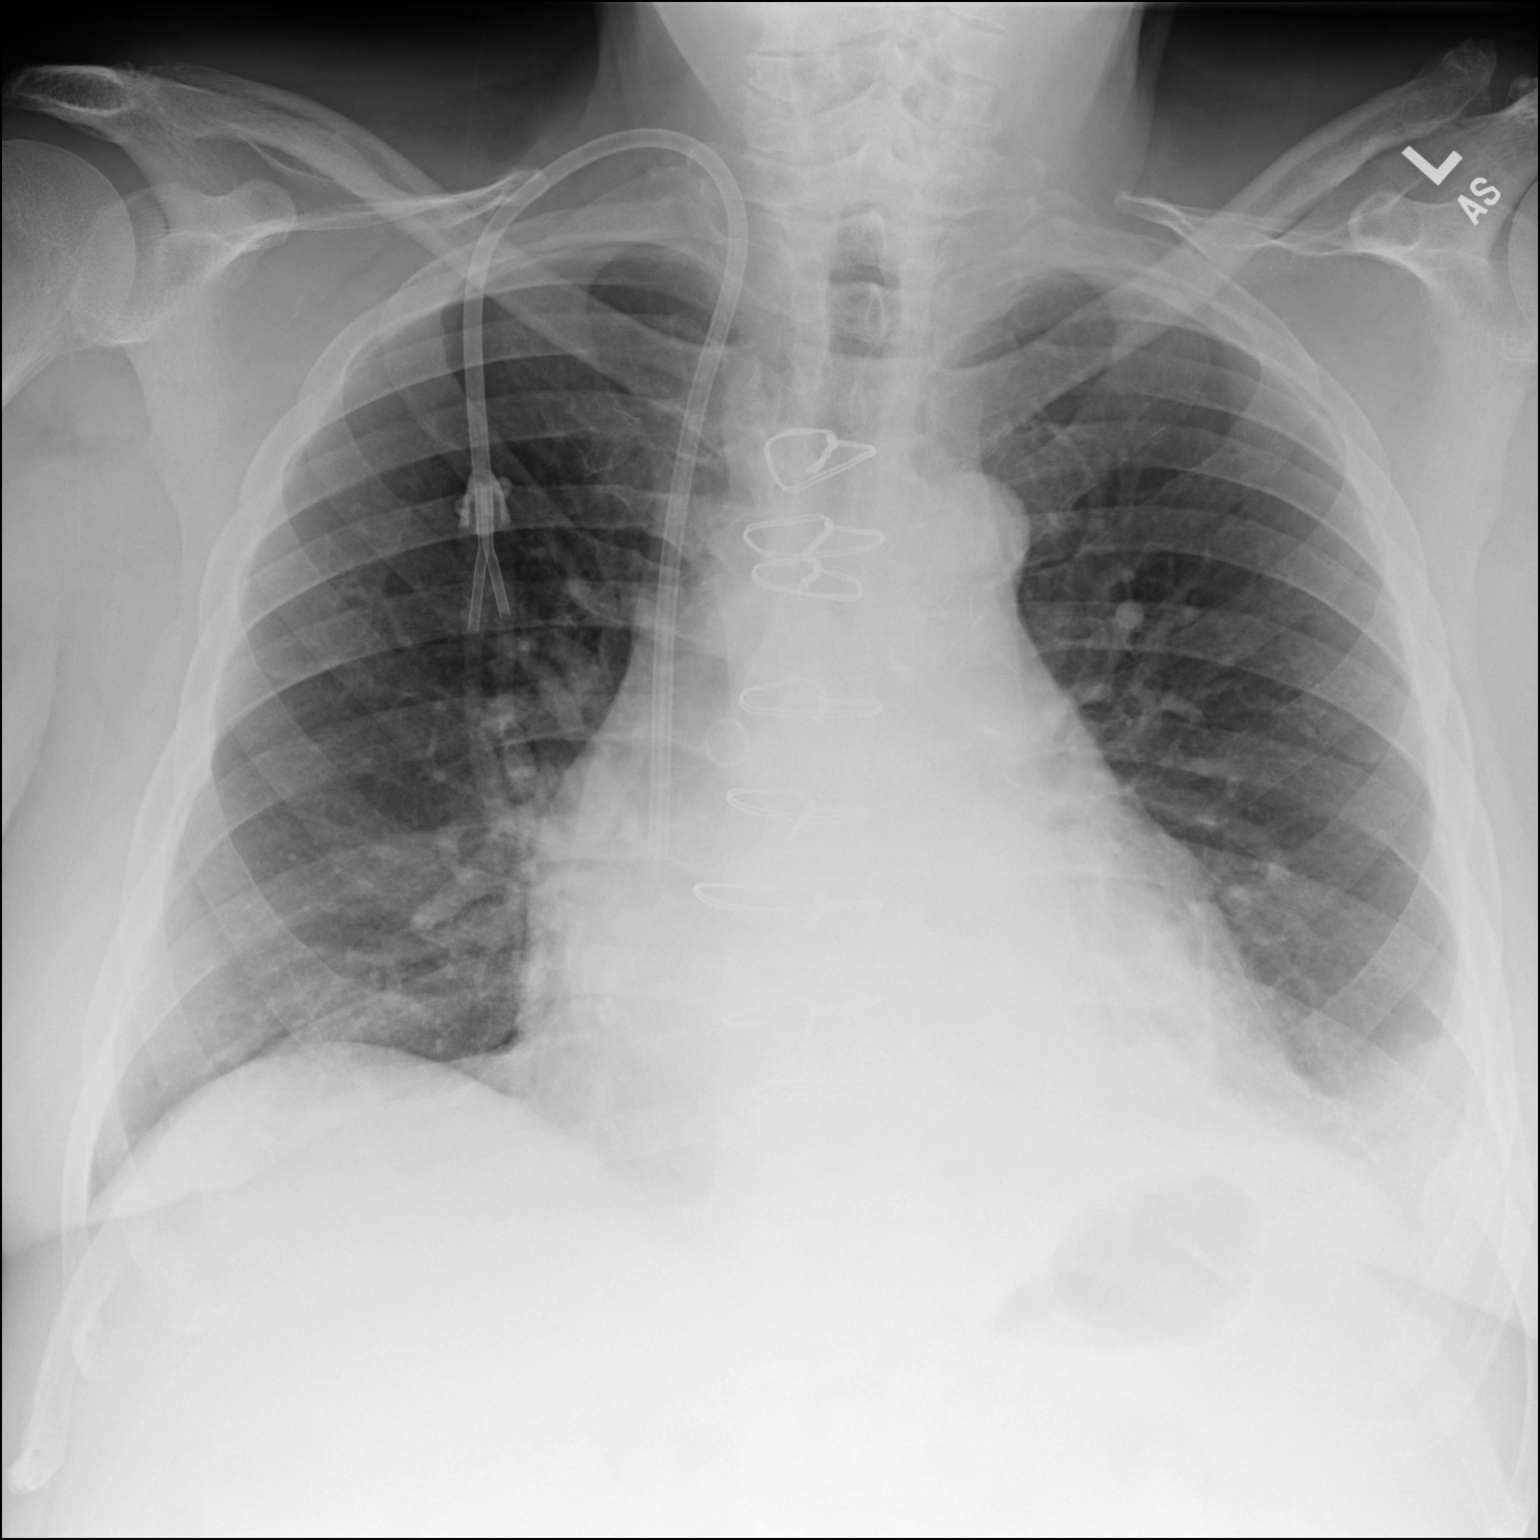

[dg chest 2 view (2 of 2)]
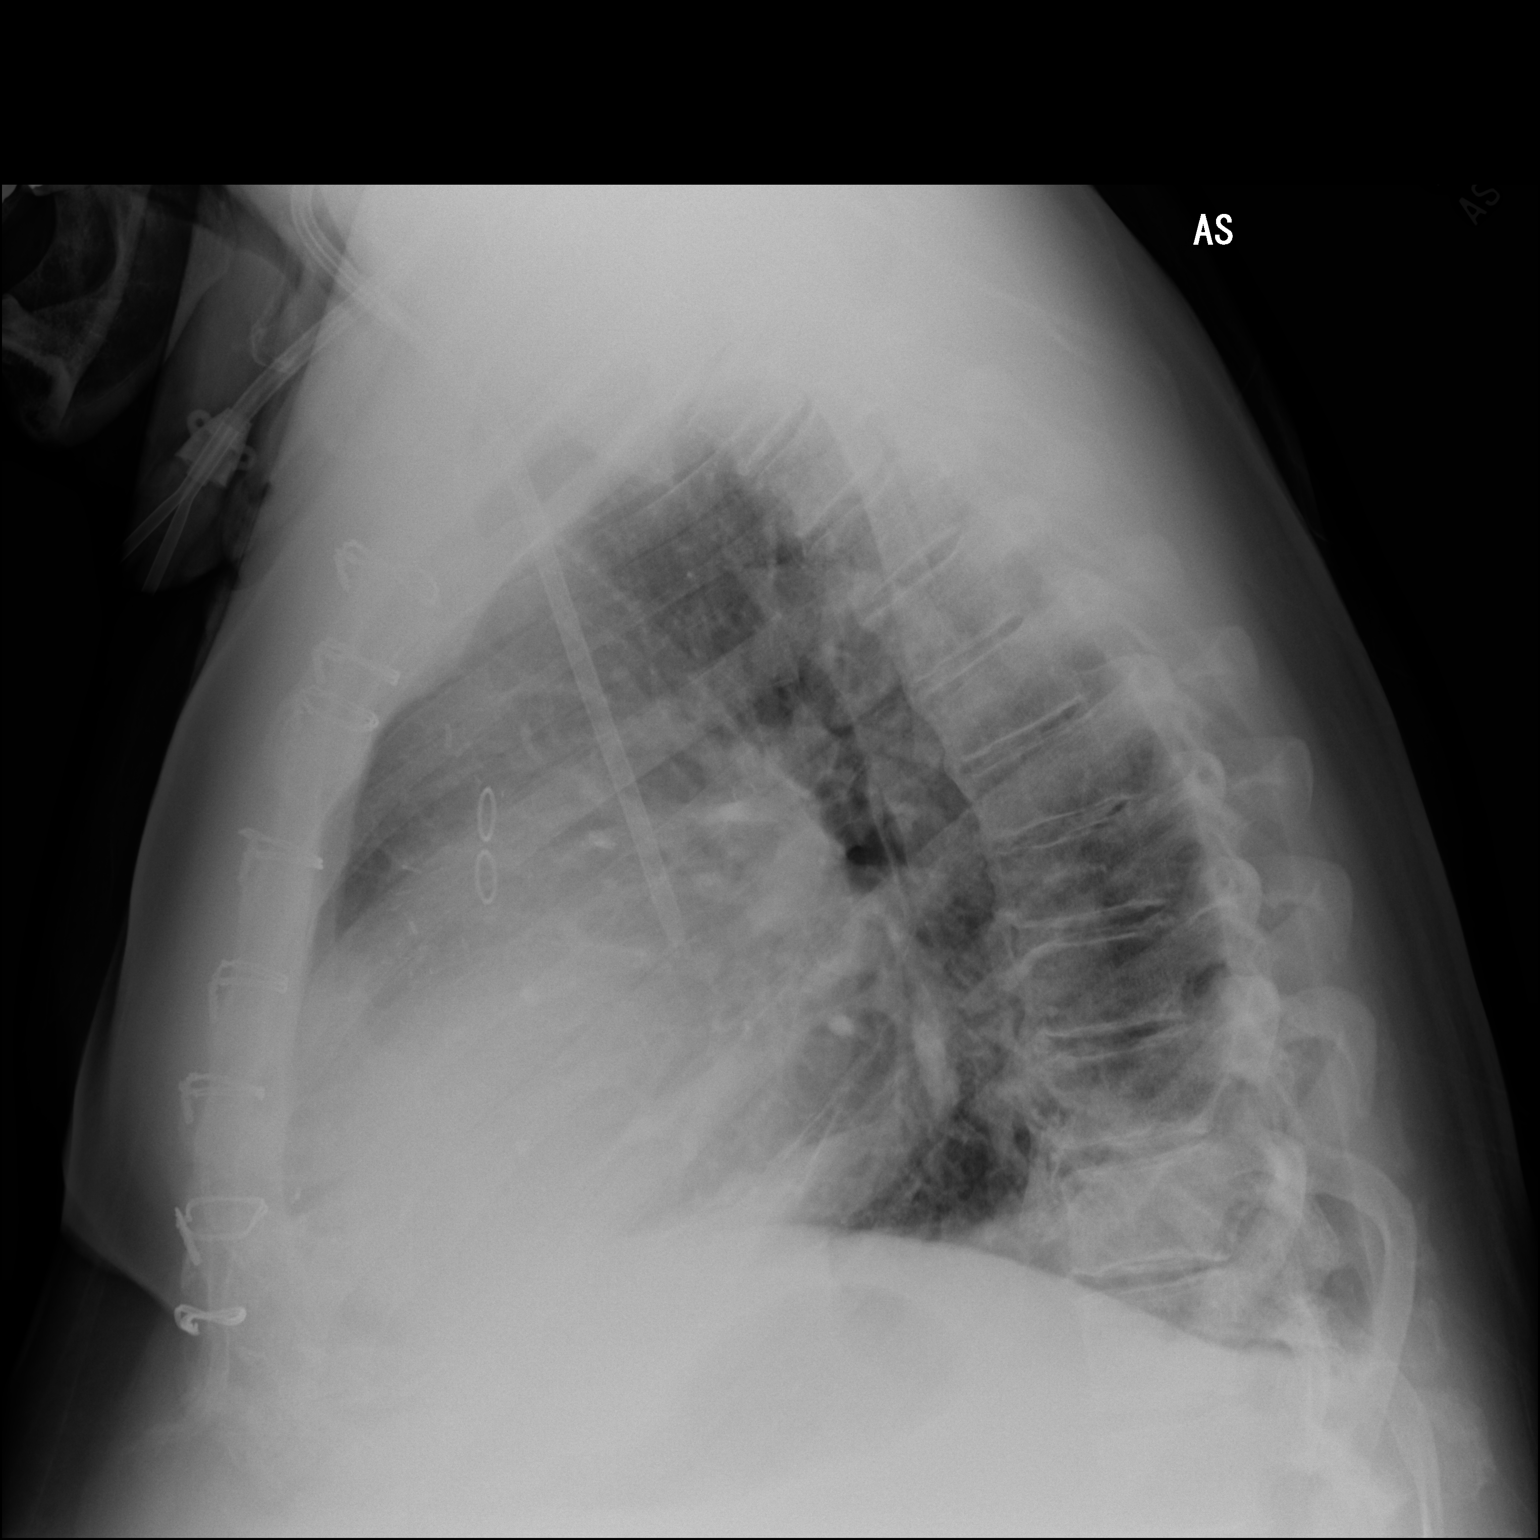

[2 of 2 positions shown; findings below may reference images not displayed]

FINDINGS: Grossly unchanged enlarged cardiac silhouette and mediastinal
contours post median sternotomy and CABG. Atherosclerotic plaque
within the thoracic aorta. Interval removal of left subclavian
central venous catheter. Right jugular approach dialysis catheter
tips terminate over the superior cavoatrial junction.

Interval development of a small left-sided pleural effusion and
associated worsening left basilar heterogeneous opacities. Right
basilar heterogeneous opacities are unchanged. No definite
right-sided pleural effusion. No evidence of edema. No pneumothorax.
Stigmata of DISH within the thoracic spine. No acute osseous
abnormalities.
IMPRESSION: 1. Interval development of a small left-sided effusion with
associated left basilar opacities, atelectasis versus infiltrate.
2. Similar findings of cardiomegaly without evidence of edema.
